# Patient Record
Sex: Male | Born: 1955 | Race: Black or African American | Hispanic: No | Marital: Married | Smoking: Former smoker
Health system: Southern US, Community
[De-identification: ages and names within clinical notes are randomized; demographics above are authoritative.]

## PROBLEM LIST (undated history)

## (undated) DIAGNOSIS — I739 Peripheral vascular disease, unspecified: Secondary | ICD-10-CM

## (undated) DIAGNOSIS — E119 Type 2 diabetes mellitus without complications: Secondary | ICD-10-CM

## (undated) DIAGNOSIS — M1990 Unspecified osteoarthritis, unspecified site: Secondary | ICD-10-CM

## (undated) DIAGNOSIS — E876 Hypokalemia: Secondary | ICD-10-CM

## (undated) DIAGNOSIS — E785 Hyperlipidemia, unspecified: Secondary | ICD-10-CM

## (undated) DIAGNOSIS — G4733 Obstructive sleep apnea (adult) (pediatric): Secondary | ICD-10-CM

## (undated) DIAGNOSIS — M545 Low back pain, unspecified: Secondary | ICD-10-CM

## (undated) DIAGNOSIS — I10 Essential (primary) hypertension: Secondary | ICD-10-CM

## (undated) DIAGNOSIS — G4700 Insomnia, unspecified: Secondary | ICD-10-CM

## (undated) DIAGNOSIS — C9000 Multiple myeloma not having achieved remission: Secondary | ICD-10-CM

## (undated) DIAGNOSIS — E559 Vitamin D deficiency, unspecified: Secondary | ICD-10-CM

## (undated) DIAGNOSIS — K219 Gastro-esophageal reflux disease without esophagitis: Secondary | ICD-10-CM

## (undated) DIAGNOSIS — M109 Gout, unspecified: Secondary | ICD-10-CM

## (undated) DIAGNOSIS — R0600 Dyspnea, unspecified: Secondary | ICD-10-CM

## (undated) DIAGNOSIS — I509 Heart failure, unspecified: Secondary | ICD-10-CM

## (undated) DIAGNOSIS — J449 Chronic obstructive pulmonary disease, unspecified: Secondary | ICD-10-CM

## (undated) DIAGNOSIS — K769 Liver disease, unspecified: Secondary | ICD-10-CM

## (undated) DIAGNOSIS — N289 Disorder of kidney and ureter, unspecified: Secondary | ICD-10-CM

## (undated) DIAGNOSIS — D509 Iron deficiency anemia, unspecified: Secondary | ICD-10-CM

## (undated) DIAGNOSIS — E349 Endocrine disorder, unspecified: Secondary | ICD-10-CM

## (undated) DIAGNOSIS — Z9981 Dependence on supplemental oxygen: Secondary | ICD-10-CM

## (undated) DIAGNOSIS — I2510 Atherosclerotic heart disease of native coronary artery without angina pectoris: Secondary | ICD-10-CM

## (undated) DIAGNOSIS — R413 Other amnesia: Secondary | ICD-10-CM

## (undated) DIAGNOSIS — Z9989 Dependence on other enabling machines and devices: Secondary | ICD-10-CM

## (undated) DIAGNOSIS — G8929 Other chronic pain: Secondary | ICD-10-CM

## (undated) DIAGNOSIS — I639 Cerebral infarction, unspecified: Secondary | ICD-10-CM

## (undated) HISTORY — DX: Other amnesia: R41.3

## (undated) HISTORY — DX: Liver disease, unspecified: K76.9

## (undated) HISTORY — DX: Gastro-esophageal reflux disease without esophagitis: K21.9

## (undated) HISTORY — DX: Vitamin D deficiency, unspecified: E55.9

## (undated) HISTORY — PX: KNEE ARTHROSCOPY: SHX127

## (undated) HISTORY — DX: Hyperlipidemia, unspecified: E78.5

## (undated) HISTORY — DX: Cerebral infarction, unspecified: I63.9

## (undated) HISTORY — DX: Endocrine disorder, unspecified: E34.9

## (undated) HISTORY — DX: Peripheral vascular disease, unspecified: I73.9

## (undated) HISTORY — DX: Chronic obstructive pulmonary disease, unspecified: J44.9

## (undated) HISTORY — DX: Insomnia, unspecified: G47.00

## (undated) HISTORY — DX: Hypokalemia: E87.6

## (undated) HISTORY — DX: Gout, unspecified: M10.9

## (undated) HISTORY — DX: Iron deficiency anemia, unspecified: D50.9

---

## 2011-10-27 ENCOUNTER — Encounter: Payer: Self-pay | Admitting: Physical Medicine & Rehabilitation

## 2011-11-04 DIAGNOSIS — I428 Other cardiomyopathies: Secondary | ICD-10-CM | POA: Insufficient documentation

## 2011-11-04 DIAGNOSIS — I429 Cardiomyopathy, unspecified: Secondary | ICD-10-CM | POA: Insufficient documentation

## 2011-11-04 DIAGNOSIS — M129 Arthropathy, unspecified: Secondary | ICD-10-CM | POA: Insufficient documentation

## 2011-11-04 DIAGNOSIS — F1010 Alcohol abuse, uncomplicated: Secondary | ICD-10-CM | POA: Insufficient documentation

## 2011-11-04 DIAGNOSIS — K219 Gastro-esophageal reflux disease without esophagitis: Secondary | ICD-10-CM | POA: Insufficient documentation

## 2011-11-04 DIAGNOSIS — R413 Other amnesia: Secondary | ICD-10-CM | POA: Insufficient documentation

## 2011-11-05 DIAGNOSIS — E559 Vitamin D deficiency, unspecified: Secondary | ICD-10-CM | POA: Insufficient documentation

## 2011-11-05 DIAGNOSIS — M109 Gout, unspecified: Secondary | ICD-10-CM | POA: Insufficient documentation

## 2011-11-15 ENCOUNTER — Emergency Department (HOSPITAL_COMMUNITY): Payer: Medicare Other

## 2011-11-15 ENCOUNTER — Emergency Department (HOSPITAL_COMMUNITY)
Admission: EM | Admit: 2011-11-15 | Discharge: 2011-11-15 | Disposition: A | Discharge status: Home / Self Care | Payer: Medicare Other | Attending: Emergency Medicine | Admitting: Emergency Medicine

## 2011-11-15 ENCOUNTER — Encounter (HOSPITAL_COMMUNITY): Payer: Self-pay | Admitting: Emergency Medicine

## 2011-11-15 DIAGNOSIS — R079 Chest pain, unspecified: Secondary | ICD-10-CM | POA: Insufficient documentation

## 2011-11-15 DIAGNOSIS — R0989 Other specified symptoms and signs involving the circulatory and respiratory systems: Secondary | ICD-10-CM | POA: Insufficient documentation

## 2011-11-15 DIAGNOSIS — G8929 Other chronic pain: Secondary | ICD-10-CM | POA: Insufficient documentation

## 2011-11-15 DIAGNOSIS — R42 Dizziness and giddiness: Secondary | ICD-10-CM | POA: Insufficient documentation

## 2011-11-15 DIAGNOSIS — M545 Low back pain, unspecified: Secondary | ICD-10-CM | POA: Insufficient documentation

## 2011-11-15 DIAGNOSIS — Z79899 Other long term (current) drug therapy: Secondary | ICD-10-CM | POA: Insufficient documentation

## 2011-11-15 DIAGNOSIS — R0602 Shortness of breath: Secondary | ICD-10-CM | POA: Insufficient documentation

## 2011-11-15 DIAGNOSIS — R51 Headache: Secondary | ICD-10-CM | POA: Insufficient documentation

## 2011-11-15 HISTORY — DX: Essential (primary) hypertension: I10

## 2011-11-15 HISTORY — DX: Atherosclerotic heart disease of native coronary artery without angina pectoris: I25.10

## 2011-11-15 HISTORY — DX: Heart failure, unspecified: I50.9

## 2011-11-15 HISTORY — DX: Disorder of kidney and ureter, unspecified: N28.9

## 2011-11-15 LAB — CBC
HCT: 43.5 % (ref 39.0–52.0)
MCH: 27.1 pg (ref 26.0–34.0)
MCV: 83.7 fL (ref 78.0–100.0)
Platelets: 301 10*3/uL (ref 150–400)
RDW: 15.2 % (ref 11.5–15.5)

## 2011-11-15 LAB — BASIC METABOLIC PANEL
Calcium: 9.7 mg/dL (ref 8.4–10.5)
Creatinine, Ser: 0.88 mg/dL (ref 0.50–1.35)
GFR calc non Af Amer: >90 mL/min (ref >90)
Glucose, Bld: 74 mg/dL (ref 70–99)
Sodium: 145 mEq/L (ref 135–145)

## 2011-11-15 LAB — DIFFERENTIAL
Basophils Absolute: 0 10*3/uL (ref 0.0–0.1)
Eosinophils Absolute: 0.3 10*3/uL (ref 0.0–0.7)
Eosinophils Relative: 3 % (ref 0–5)
Monocytes Absolute: 0.4 10*3/uL (ref 0.1–1.0)

## 2011-11-15 LAB — TROPONIN I: Troponin I: <0.3 ng/mL (ref <0.30)

## 2011-11-15 MED ORDER — ASPIRIN 81 MG PO CHEW
CHEWABLE_TABLET | ORAL | Status: AC
  Administered 2011-11-15: 17:00:00
  Filled 2011-11-15: qty 4

## 2011-11-15 MED ORDER — OXYCODONE-ACETAMINOPHEN 5-325 MG PO TABS: 1.0000 | ORAL_TABLET | ORAL | Status: AC | PRN

## 2011-11-15 MED ORDER — OXYCODONE-ACETAMINOPHEN 5-325 MG PO TABS
ORAL_TABLET | ORAL | Status: AC
  Administered 2011-11-15: 17:00:00
  Filled 2011-11-15: qty 1

## 2011-11-15 NOTE — ED Notes (Signed)
Patient presents to the ED with complaints of a Headache for approximately 48 hours

## 2011-11-15 NOTE — Discharge Instructions (Signed)
FOLLOW UP WITH YOUR DOCTOR THIS WEEK FOR RECHECK. CONTINUE YOUR REGULAR MEDICATIONS AS PRESCRIBED.   Chest Pain (Nonspecific) It is often hard to give a specific diagnosis for the cause of chest pain. There is always a chance that your pain could be related to something serious, such as a heart attack or a blood clot in the lungs. You need to follow up with your caregiver for further evaluation. CAUSES   Heartburn.   Pneumonia or bronchitis.   Anxiety or stress.   Inflammation around your heart (pericarditis) or lung (pleuritis or pleurisy).   A blood clot in the lung.   A collapsed lung (pneumothorax). It can develop suddenly on its own (spontaneous pneumothorax) or from injury (trauma) to the chest.   Shingles infection (herpes zoster virus).  The chest wall is composed of bones, muscles, and cartilage. Any of these can be the source of the pain.  The bones can be bruised by injury.   The muscles or cartilage can be strained by coughing or overwork.   The cartilage can be affected by inflammation and become sore (costochondritis).  DIAGNOSIS  Lab tests or other studies, such as X-rays, electrocardiography, stress testing, or cardiac imaging, may be needed to find the cause of your pain.  TREATMENT   Treatment depends on what may be causing your chest pain. Treatment may include:   Acid blockers for heartburn.   Anti-inflammatory medicine.   Pain medicine for inflammatory conditions.   Antibiotics if an infection is present.   You may be advised to change lifestyle habits. This includes stopping smoking and avoiding alcohol, caffeine, and chocolate.   You may be advised to keep your head raised (elevated) when sleeping. This reduces the chance of acid going backward from your stomach into your esophagus.   Most of the time, nonspecific chest pain will improve within 2 to 3 days with rest and mild pain medicine.  HOME CARE INSTRUCTIONS   If antibiotics were prescribed,  take your antibiotics as directed. Finish them even if you start to feel better.   For the next few days, avoid physical activities that bring on chest pain. Continue physical activities as directed.   Do not smoke.   Avoid drinking alcohol.   Only take over-the-counter or prescription medicine for pain, discomfort, or fever as directed by your caregiver.   Follow your caregiver's suggestions for further testing if your chest pain does not go away.   Keep any follow-up appointments you made. If you do not go to an appointment, you could develop lasting (chronic) problems with pain. If there is any problem keeping an appointment, you must call to reschedule.  SEEK MEDICAL CARE IF:   You think you are having problems from the medicine you are taking. Read your medicine instructions carefully.   Your chest pain does not go away, even after treatment.   You develop a rash with blisters on your chest.  SEEK IMMEDIATE MEDICAL CARE IF:   You have increased chest pain or pain that spreads to your arm, neck, jaw, back, or abdomen.   You develop shortness of breath, an increasing cough, or you are coughing up blood.   You have severe back or abdominal pain, feel nauseous, or vomit.   You develop severe weakness, fainting, or chills.   You have a fever.  THIS IS AN EMERGENCY. Do not wait to see if the pain will go away. Get medical help at once. Call your local emergency services (911 in  [place] Do not drive yourself to the hospital. MAKE SURE YOU:   Understand these instructions.   Will watch your condition.   Will get help right away if you are not doing well or get worse.  Document Released: 04/08/2005 Document Revised: 06/18/2011 Document Reviewed: 02/02/2008 [facility] Patient Information 2012 [place], [place].

## 2011-11-15 NOTE — ED Provider Notes (Signed)
History     CSN: [id number]  Arrival date & time 11/15/11  1525   First MD Initiated Contact with Patient 11/15/11 1631      Chief Complaint  Patient presents with  . Headache    (Consider location/radiation/quality/duration/timing/severity/associated sxs/prior treatment) Patient is a 56 y.o. male presenting with headaches and back pain. The history is provided by the patient.  Headache  This is a new problem. The current episode started yesterday. The problem occurs constantly. The problem has been resolved. Associated with: Chest pain that started yesterday and remained constant until today, resolving spontaneously at 2:00. He has SOB that persists now without pain. No N, V, extremity edema. Associated symptoms include shortness of breath. Pertinent negatives include no fever, no nausea and no vomiting. Associated symptoms comments: He reports a history of COPD, CHF, HTN, DM as well as medication noncompliance. He denies history of ischemic heart disease. .  Back Pain  This is a chronic problem. The problem occurs constantly. Associated symptoms include chest pain and headaches. Pertinent negatives include no fever and no abdominal pain. Associated symptoms comments: Chronic low back pain uncontrolled with usual medication. He is currently being referred to Pain Management. No new injury or change in chronic symptoms..    No past medical history on file.  No past surgical history on file.  No family history on file.  History  Substance Use Topics  . Smoking status: Not on file  . Smokeless tobacco: Not on file  . Alcohol Use: Not on file      Review of Systems  Constitutional: Negative for fever.  Respiratory: Positive for shortness of breath. Negative for cough.   Cardiovascular: Positive for chest pain.  Gastrointestinal: Negative for nausea, vomiting and abdominal pain.  Musculoskeletal: Positive for back pain.  Neurological: Positive for headaches.    Allergies    Review of patient's allergies indicates no known allergies.  Home Medications   Current Outpatient Rx  Name Route Sig Dispense Refill  . ALBUTEROL SULFATE HFA 108 (90 BASE) MCG/ACT IN AERS Inhalation Inhale 2 puffs into the lungs every 6 (six) hours as needed. For shortness of breath    . ALLOPURINOL 300 MG PO TABS Oral Take 300 mg by mouth daily.    Marland Kitchen AMLODIPINE BESYLATE 5 MG PO TABS Oral Take 5 mg by mouth daily.    Marland Kitchen CARVEDILOL 12.5 MG PO TABS Oral Take 12.5 mg by mouth 2 (two) times daily with a meal.    . FLUTICASONE-SALMETEROL 250-50 MCG/DOSE IN AEPB Inhalation Inhale 1 puff into the lungs every 12 (twelve) hours.    . IBUPROFEN 600 MG PO TABS Oral Take 600 mg by mouth 2 (two) times daily.    Marland Kitchen METFORMIN HCL 500 MG PO TABS Oral Take 500 mg by mouth 2 (two) times daily with a meal.    . OLMESARTAN MEDOXOMIL 20 MG PO TABS Oral Take 20 mg by mouth daily.    Marland Kitchen OMEPRAZOLE 20 MG PO CPDR Oral Take 20 mg by mouth every morning.    Marland Kitchen POTASSIUM CHLORIDE ER 10 MEQ PO TBCR Oral Take 10 mEq by mouth daily.    Marland Kitchen PRAVASTATIN SODIUM 10 MG PO TABS Oral Take 20 mg by mouth at bedtime.    Marland Kitchen SILDENAFIL CITRATE 100 MG PO TABS Oral Take 100 mg by mouth daily as needed. For E.D.    . TIOTROPIUM BROMIDE MONOHYDRATE 18 MCG IN CAPS Inhalation Place 18 mcg into inhaler and inhale daily.    Marland Kitchen  VARENICLINE TARTRATE 0.5 MG X 11 & 1 MG X 42 PO MISC Oral Take by mouth 2 (two) times daily. Take one 0.5 mg tablet by mouth once daily for 3 days, then increase to one 0.5 mg tablet twice daily for 4 days, then increase to one 1 mg tablet twice daily.    Marland Kitchen ZOLPIDEM TARTRATE 5 MG PO TABS Oral Take 5 mg by mouth at bedtime.      BP 146/94  Pulse 87  Temp(Src) 98.2 F (36.8 C) (Oral)  Resp 22  SpO2 100%  Physical Exam  Constitutional: He appears well-developed and well-nourished. No distress.  HENT:  Head: Normocephalic.  Neck: Normal range of motion.  Cardiovascular: Normal rate.   No murmur  heard. Pulmonary/Chest: Effort normal and breath sounds normal. He has no wheezes. He exhibits no tenderness.       Rales diffusely at lower lobes.  Abdominal: Soft. There is no tenderness. There is no rebound.  Musculoskeletal:       Mild paralumbar tenderness. No swelling. FROM.  Skin: Skin is warm and dry.    ED Course  Procedures (including critical care time)  Labs Reviewed  GLUCOSE, CAPILLARY - Abnormal; Notable for the following:    Glucose-Capillary 109 (*)    All other components within normal limits  CBC  DIFFERENTIAL  BASIC METABOLIC PANEL  TROPONIN I   No results found. Results for orders placed during the hospital encounter of 11/15/11  GLUCOSE, CAPILLARY      Component Value Range   Glucose-Capillary 109 (*) 70 - 99 (mg/dL)  CBC      Component Value Range   WBC 7.8  4.0 - 10.5 (K/uL)   RBC 5.20  4.22 - 5.81 (MIL/uL)   Hemoglobin 14.1  13.0 - 17.0 (g/dL)   HCT 40.9  81.1 - 91.4 (%)   MCV 83.7  78.0 - 100.0 (fL)   MCH 27.1  26.0 - 34.0 (pg)   MCHC 32.4  30.0 - 36.0 (g/dL)   RDW 78.2  95.6 - 21.3 (%)   Platelets 301  150 - 400 (K/uL)  DIFFERENTIAL      Component Value Range   Neutrophils Relative 52  43 - 77 (%)   Neutro Abs 4.0  1.7 - 7.7 (K/uL)   Lymphocytes Relative 40  12 - 46 (%)   Lymphs Abs 3.1  0.7 - 4.0 (K/uL)   Monocytes Relative 5  3 - 12 (%)   Monocytes Absolute 0.4  0.1 - 1.0 (K/uL)   Eosinophils Relative 3  0 - 5 (%)   Eosinophils Absolute 0.3  0.0 - 0.7 (K/uL)   Basophils Relative 0  0 - 1 (%)   Basophils Absolute 0.0  0.0 - 0.1 (K/uL)  BASIC METABOLIC PANEL      Component Value Range   Sodium 145  135 - 145 (mEq/L)   Potassium 2.9 (*) 3.5 - 5.1 (mEq/L)   Chloride 106  96 - 112 (mEq/L)   CO2 27  19 - 32 (mEq/L)   Glucose, Bld 74  70 - 99 (mg/dL)   BUN 12  6 - 23 (mg/dL)   Creatinine, Ser 0.86  0.50 - 1.35 (mg/dL)   Calcium 9.7  8.4 - 57.8 (mg/dL)   GFR calc non Af Amer >90  >90 (mL/min)   GFR calc Af Amer >90  >90 (mL/min)   TROPONIN I      Component Value Range   Troponin I <0.30  <0.30 (ng/mL)  Dg Chest Portable 1 View  11/15/2011  *RADIOLOGY REPORT*  Clinical Data: High blood pressure and dizziness.  PORTABLE CHEST - 1 VIEW  Comparison: None  Findings: The cardiac silhouette, mediastinal and hilar contours are within normal limits.  There is mild tortuosity and ectasia of the thoracic aorta.  The lungs are clear.  No pleural effusion. The bony thorax is intact.  IMPRESSION: No acute cardiopulmonary findings.  Original Report Authenticated By: P. Loralie Champagne, M.D.     No diagnosis found.  1. Chest pain 2. Chronic back pain   MDM  Pain free while in ED. All labs negative in evaluation of chest pain that would be atypical for angina. Feel he is stable for discharge.         Rodena Medin, PA-C 11/15/11 [phone]

## 2011-11-15 NOTE — ED Notes (Signed)
Resting quietly with family at the bedside NAD noted at this time

## 2011-11-15 NOTE — ED Notes (Signed)
Patient is discharged with instructions with his daughters he verbalizes an understanding of instructions.

## 2011-11-15 NOTE — ED Provider Notes (Signed)
Medical screening examination/treatment/procedure(s) were performed by non-physician practitioner and as supervising physician I was immediately available for consultation/collaboration.  Juliet Rude. Rubin Payor, MD 11/15/11 2321

## 2011-11-23 ENCOUNTER — Encounter: Payer: Self-pay | Admitting: Physical Medicine & Rehabilitation

## 2011-11-23 ENCOUNTER — Encounter: Payer: Medicare Other | Attending: Physical Medicine & Rehabilitation

## 2011-11-23 ENCOUNTER — Ambulatory Visit (HOSPITAL_BASED_OUTPATIENT_CLINIC_OR_DEPARTMENT_OTHER): Payer: Medicare Other | Admitting: Physical Medicine & Rehabilitation

## 2011-11-23 DIAGNOSIS — J438 Other emphysema: Secondary | ICD-10-CM | POA: Insufficient documentation

## 2011-11-23 DIAGNOSIS — M545 Low back pain, unspecified: Secondary | ICD-10-CM | POA: Insufficient documentation

## 2011-11-23 DIAGNOSIS — M109 Gout, unspecified: Secondary | ICD-10-CM | POA: Insufficient documentation

## 2011-11-23 DIAGNOSIS — M1712 Unilateral primary osteoarthritis, left knee: Secondary | ICD-10-CM | POA: Insufficient documentation

## 2011-11-23 DIAGNOSIS — E785 Hyperlipidemia, unspecified: Secondary | ICD-10-CM

## 2011-11-23 DIAGNOSIS — E119 Type 2 diabetes mellitus without complications: Secondary | ICD-10-CM

## 2011-11-23 DIAGNOSIS — G8929 Other chronic pain: Secondary | ICD-10-CM

## 2011-11-23 DIAGNOSIS — I10 Essential (primary) hypertension: Secondary | ICD-10-CM | POA: Insufficient documentation

## 2011-11-23 DIAGNOSIS — M1710 Unilateral primary osteoarthritis, unspecified knee: Secondary | ICD-10-CM | POA: Insufficient documentation

## 2011-11-23 DIAGNOSIS — M1A9XX0 Chronic gout, unspecified, without tophus (tophi): Secondary | ICD-10-CM | POA: Insufficient documentation

## 2011-11-23 DIAGNOSIS — M1A00X0 Idiopathic chronic gout, unspecified site, without tophus (tophi): Secondary | ICD-10-CM

## 2011-11-23 DIAGNOSIS — I509 Heart failure, unspecified: Secondary | ICD-10-CM | POA: Insufficient documentation

## 2011-11-23 DIAGNOSIS — M549 Dorsalgia, unspecified: Secondary | ICD-10-CM

## 2011-11-23 DIAGNOSIS — M5430 Sciatica, unspecified side: Secondary | ICD-10-CM | POA: Insufficient documentation

## 2011-11-23 DIAGNOSIS — J42 Unspecified chronic bronchitis: Secondary | ICD-10-CM | POA: Insufficient documentation

## 2011-11-23 MED ORDER — CYCLOBENZAPRINE HCL 10 MG PO TABS: 10.0000 mg | ORAL_TABLET | Freq: Three times a day (TID) | ORAL | Status: DC | PRN

## 2011-11-23 MED ORDER — TRAMADOL HCL 50 MG PO TABS: 50.0000 mg | ORAL_TABLET | Freq: Four times a day (QID) | ORAL | Status: AC | PRN

## 2011-11-23 MED ORDER — TRAMADOL HCL 50 MG PO TABS: 50.0000 mg | ORAL_TABLET | Freq: Three times a day (TID) | ORAL | Status: DC | PRN

## 2011-11-23 NOTE — Patient Instructions (Addendum)
At this point your examination shows no serious problem this has been going on 19 years. I don't see a reason to send you to a surgeon. We will check some x-rays. Will give you some exercises. We'll try you on tramadol which she states has helped in the past as well as cyclobenzaprine. We'll check a urine drug screen in case we later at on some narcotic medications. I'll need to get some documentation that you have some serious back issues prior to starting any stronger medications. Back Pain, Adult Low back pain is very common. About 1 in 5 people have back pain.The cause of low back pain is rarely dangerous. The pain often gets better over time.About half of people with a sudden onset of back pain feel better in just 2 weeks. About 8 in 10 people feel better by 6 weeks.  CAUSES Some common causes of back pain include:  Strain of the muscles or ligaments supporting the spine.   Wear and tear (degeneration) of the spinal discs.   Arthritis.   Direct injury to the back.  DIAGNOSIS Most of the time, the direct cause of low back pain is not known.However, back pain can be treated effectively even when the exact cause of the pain is unknown.Answering your caregiver's questions about your overall health and symptoms is one of the most accurate ways to make sure the cause of your pain is not dangerous. If your caregiver needs more information, he or she may order lab work or imaging tests (X-rays or MRIs).However, even if imaging tests show changes in your back, this usually does not require surgery. HOME CARE INSTRUCTIONS For many people, back pain returns.Since low back pain is rarely dangerous, it is often a condition that people can learn to [facility] their own.   Remain active. It is stressful on the back to sit or stand in one place. Do not sit, drive, or stand in one place for more than 30 minutes at a time. Take short walks on level surfaces as soon as pain allows.Try to increase the  length of time you walk each day.   Do not stay in bed.Resting more than 1 or 2 days can delay your recovery.   Do not avoid exercise or work.Your body is made to move.It is not dangerous to be active, even though your back may hurt.Your back will likely heal faster if you return to being active before your pain is gone.   Pay attention to your body when you bend and lift. Many people have less discomfortwhen lifting if they bend their knees, keep the load close to their bodies,and avoid twisting. Often, the most comfortable positions are those that put less stress on your recovering back.   Find a comfortable position to sleep. Use a firm mattress and lie on your side with your knees slightly bent. If you lie on your back, put a pillow under your knees.   Only take over-the-counter or prescription medicines as directed by your caregiver. Over-the-counter medicines to reduce pain and inflammation are often the most helpful.Your caregiver may prescribe muscle relaxant drugs.These medicines help dull your pain so you can more quickly return to your normal activities and healthy exercise.   Put ice on the injured area.   Put ice in a plastic bag.   Place a towel between your skin and the bag.   Leave the ice on for 15 to 20 minutes, 3 to 4 times a day for the first 2 to 3  days. After that, ice and heat may be alternated to reduce pain and spasms.   Ask your caregiver about trying back exercises and gentle massage. This may be of some benefit.   Avoid feeling anxious or stressed.Stress increases muscle tension and can worsen back pain.It is important to recognize when you are anxious or stressed and learn ways to manage it.Exercise is a great option.  SEEK MEDICAL CARE IF:  You have pain that is not relieved with rest or medicine.   You have pain that does not improve in 1 week.   You have new symptoms.   You are generally not feeling well.  SEEK IMMEDIATE MEDICAL CARE IF:    You have pain that radiates from your back into your legs.   You develop new bowel or bladder control problems.   You have unusual weakness or numbness in your arms or legs.   You develop nausea or vomiting.   You develop abdominal pain.   You feel faint.  Document Released: 06/29/2005 Document Revised: 06/18/2011 Document Reviewed: 11/17/2010 [facility] Patient Information 2012 [place], [place].

## 2011-11-23 NOTE — Progress Notes (Addendum)
Subjective:    Patient ID: James Zuniga, male    DOB: September 16, 1955, 56 y.o.   MRN: [id number]  HPI 19 year history of low back pain and pain shooting down the left leg. He also has knee arthritis and is soon having a left knee replacement. He needs to get cardiac clearance prior to his knee replacement. His other past medical history includes Recently saw orthopedics and was given hydrocodone for knee pain. Usually uses a fentanyl patch for his back pain. The patient needs to go to a spine clinic in [place] and they also did lumbar injections which were helpful. Congestive heart failure Chronic gout, Emphysema Diabetes type 2 Hyperlipidemia Hypertension Chronic bronchitis  Pain Inventory Average Pain 7 Pain Right Now 7 My pain is sharp, stabbing and aching  In the last 24 hours, has pain interfered with the following? General activity 7 Relation with others 7 Enjoyment of life 7 What TIME of day is your pain at its worst? daytime and night Sleep (in general) Poor  Pain is worse with: walking, bending, sitting and standing Pain improves with: medication, TENS and injections Relief from Meds: 5  Mobility walk without assistance walk with assistance use a cane how many minutes can you walk? 10-15 min ability to climb steps?  no do you drive?  yes  Function retired I need assistance with the following:  meal prep, household duties and shopping  Neuro/Psych bladder control problems trouble walking  Prior Studies Any changes since last visit?  no  Physicians involved in your care Any changes since last visit?  no       Review of Systems  Constitutional: Negative.   HENT: Negative.   Eyes: Negative.   Respiratory: Negative.   Cardiovascular: Negative.   Gastrointestinal: Negative.   Genitourinary: Negative.   Musculoskeletal: Negative.   Skin: Negative.   Neurological: Negative.   Hematological: Negative.   Psychiatric/Behavioral: Negative.    Social  Lives in [place] for 3 months     Objective:   Physical Exam  Constitutional: He is oriented to person, place, and time. He appears well-developed and well-nourished.  Musculoskeletal:       Lumbar back: He exhibits decreased range of motion and edema. He exhibits no tenderness and no deformity.       skin discoloration under the infrared current stimulatorat the lumbosacral paraspinal muscles. Range of motion is diminished in all directions  Neurological: He is alert and oriented to person, place, and time. He has normal strength. No sensory deficit. Gait abnormal.  Reflex Scores:      Tricep reflexes are 2+ on the right side and 2+ on the left side.      Bicep reflexes are 2+ on the right side and 2+ on the left side.      Brachioradialis reflexes are 2+ on the right side and 2+ on the left side.      Patellar reflexes are 2+ on the right side and 2+ on the left side.      Achilles reflexes are 2+ on the right side and 2+ on the left side. Psychiatric: He has a normal mood and affect.              Assessment:    Sciatica possibly due to degenerative joint disease at intervertebral facet joints    Plan:    Natural history and expected course discussed. Questions answered. Stretching exercises discussed. Muscle relaxants per medication orders.  Trial of tramadol Lumbosacral x-rays See the patient  back in 4 weeks  UDS positive for hydrocodone and oxycodone  Negative for illicit drugs

## 2011-12-03 ENCOUNTER — Telehealth: Payer: Self-pay | Admitting: *Deleted

## 2011-12-03 NOTE — Telephone Encounter (Signed)
Error

## 2011-12-14 ENCOUNTER — Encounter: Payer: Medicare Other | Attending: Physical Medicine & Rehabilitation

## 2011-12-14 ENCOUNTER — Encounter: Payer: Self-pay | Admitting: Physical Medicine & Rehabilitation

## 2011-12-14 ENCOUNTER — Ambulatory Visit (HOSPITAL_BASED_OUTPATIENT_CLINIC_OR_DEPARTMENT_OTHER): Payer: Medicare Other | Admitting: Physical Medicine & Rehabilitation

## 2011-12-14 VITALS — BP 138/88 | HR 73 | Ht 66.0 in | Wt 183.0 lb

## 2011-12-14 DIAGNOSIS — M545 Low back pain, unspecified: Secondary | ICD-10-CM | POA: Insufficient documentation

## 2011-12-14 DIAGNOSIS — M5430 Sciatica, unspecified side: Secondary | ICD-10-CM | POA: Insufficient documentation

## 2011-12-14 DIAGNOSIS — E785 Hyperlipidemia, unspecified: Secondary | ICD-10-CM | POA: Insufficient documentation

## 2011-12-14 DIAGNOSIS — E119 Type 2 diabetes mellitus without complications: Secondary | ICD-10-CM | POA: Insufficient documentation

## 2011-12-14 DIAGNOSIS — M109 Gout, unspecified: Secondary | ICD-10-CM | POA: Insufficient documentation

## 2011-12-14 DIAGNOSIS — I10 Essential (primary) hypertension: Secondary | ICD-10-CM | POA: Insufficient documentation

## 2011-12-14 DIAGNOSIS — J438 Other emphysema: Secondary | ICD-10-CM | POA: Insufficient documentation

## 2011-12-14 DIAGNOSIS — M1710 Unilateral primary osteoarthritis, unspecified knee: Secondary | ICD-10-CM | POA: Insufficient documentation

## 2011-12-14 MED ORDER — TRAMADOL HCL 50 MG PO TABS: 50.0000 mg | ORAL_TABLET | Freq: Four times a day (QID) | ORAL | Status: AC | PRN

## 2011-12-14 NOTE — Patient Instructions (Signed)

## 2011-12-14 NOTE — Progress Notes (Signed)
Subjective:    Patient ID: James Zuniga, male    DOB: [birth year], 56 y.o.   MRN: [id number]  HPI  Pain Inventory Average Pain 8 Pain Right Now 7 My pain is sharp, stabbing and aching  In the last 24 hours, has pain interfered with the following? General activity 7 Relation with others 7 Enjoyment of life 7 What TIME of day is your pain at its worst? varies Sleep (in general) Fair  Pain is worse with: some activites Pain improves with: rest and medication Relief from Meds: 0  Mobility walk with assistance use a cane how many minutes can you walk? 10 min ability to climb steps?  yes do you drive?  yes  Function disabled: date disabled 27 I need assistance with the following:  household duties and shopping  Neuro/Psych weakness numbness trouble walking spasms dizziness confusion  Prior Studies Any changes since last visit?  no  Physicians involved in your care Any changes since last visit?  no   Family History  Problem Relation Age of Onset  . Heart disease Mother    History   Social History  . Marital Status: Unknown    Spouse Name: N/A    Number of Children: N/A  . Years of Education: N/A   Social History Main Topics  . Smoking status: Former Games developer  . Smokeless tobacco: Never Used  . Alcohol Use: No  . Drug Use: None  . Sexually Active: None   Other Topics Concern  . None   Social History Narrative  . None   History reviewed. No pertinent past surgical history. Past Medical History  Diagnosis Date  . CHF (congestive heart failure)   . Coronary artery disease   . Diabetes mellitus   . Hypertension   . Renal disorder   . Hyperlipidemia    BP 138/88  Pulse 73  Ht 5\' 6"  (1.676 m)  Wt 183 lb (83.008 kg)  BMI 29.54 kg/m2  SpO2 97%     Review of Systems  Respiratory: Positive for cough and shortness of breath.   Gastrointestinal: Positive for diarrhea.  Genitourinary: Positive for difficulty urinating.  Neurological: Positive  for weakness and numbness.  All other systems reviewed and are negative.       Objective:   Physical Exam  Musculoskeletal:       Lumbar back: He exhibits decreased range of motion and tenderness. He exhibits no swelling, no edema, no deformity and no spasm.       Mild tenderness in lumbo sacral paraspinal muscles  Neurological: He is alert. He has normal strength. He displays no atrophy. He exhibits normal muscle tone. Gait abnormal.  Reflex Scores:      Tricep reflexes are 2+ on the right side and 2+ on the left side.      Bicep reflexes are 2+ on the right side and 2+ on the left side.      Brachioradialis reflexes are 2+ on the right side and 2+ on the left side.      Patellar reflexes are 2+ on the right side and 2+ on the left side.      Achilles reflexes are 2+ on the right side and 2+ on the left side. Psychiatric: His affect is blunt.          Assessment & Plan:  1. Lumbar pain without sciatica. The patient states he has had pain for 19 years and has been seen at a spine Center in the past. I do not  have any records. He has no imaging studies that I can review. He did not undergo the x-ray study that I ordered last visit. We discussed that based on the fact he has no risk factors for any serious condition I will treat him as a new patient. We'll do conservative care without narcotic analgesics check x-rays. Encouraged active lifestyle. He'll see my PA in 2 weeks. If he continues have pain in the x-rays do not show any significant pathology may be an MRI at that point. He can also receive back exercises at his next visit.

## 2011-12-21 ENCOUNTER — Ambulatory Visit: Payer: Medicare Other | Admitting: Physical Medicine & Rehabilitation

## 2011-12-22 ENCOUNTER — Ambulatory Visit (HOSPITAL_COMMUNITY)
Admission: RE | Admit: 2011-12-22 | Discharge: 2011-12-22 | Disposition: A | Discharge status: Home / Self Care | Payer: Medicare Other | Source: Ambulatory Visit | Attending: Physical Medicine & Rehabilitation | Admitting: Physical Medicine & Rehabilitation

## 2011-12-22 DIAGNOSIS — M545 Low back pain, unspecified: Secondary | ICD-10-CM | POA: Insufficient documentation

## 2011-12-22 DIAGNOSIS — M948X9 Other specified disorders of cartilage, unspecified sites: Secondary | ICD-10-CM | POA: Insufficient documentation

## 2011-12-22 DIAGNOSIS — M51379 Other intervertebral disc degeneration, lumbosacral region without mention of lumbar back pain or lower extremity pain: Secondary | ICD-10-CM | POA: Insufficient documentation

## 2011-12-22 DIAGNOSIS — M5137 Other intervertebral disc degeneration, lumbosacral region: Secondary | ICD-10-CM | POA: Insufficient documentation

## 2011-12-23 ENCOUNTER — Telehealth: Payer: Self-pay | Admitting: *Deleted

## 2011-12-23 MED ORDER — MELOXICAM 7.5 MG PO TABS: 7.5000 mg | ORAL_TABLET | Freq: Every day | ORAL | Status: DC

## 2011-12-23 NOTE — Telephone Encounter (Signed)
Pt aware of xray results and he would like to proceed with an injection. What injection would you like done? He is also wanting a stronger pain medication, Tramadol isn't helping.

## 2011-12-23 NOTE — Telephone Encounter (Signed)
May call in mobic 7.5mg  qd #30 no refill Schedule for sacroiliac injection

## 2011-12-23 NOTE — Telephone Encounter (Signed)
LM with pt. Please get injection approved. Thanks!

## 2011-12-23 NOTE — Telephone Encounter (Signed)
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

## 2011-12-23 NOTE — Addendum Note (Signed)
Addended by: Caryl Ada on: 12/23/2011 12:00 PM   Modules accepted: Orders

## 2011-12-28 ENCOUNTER — Encounter: Payer: Medicare Other | Attending: Physical Medicine & Rehabilitation | Admitting: Physical Medicine and Rehabilitation

## 2011-12-31 ENCOUNTER — Ambulatory Visit: Payer: Medicare Other | Admitting: Physical Medicine & Rehabilitation

## 2012-01-04 ENCOUNTER — Telehealth: Payer: Self-pay | Admitting: *Deleted

## 2012-01-04 DIAGNOSIS — Z6827 Body mass index (BMI) 27.0-27.9, adult: Secondary | ICD-10-CM | POA: Insufficient documentation

## 2012-01-04 NOTE — Telephone Encounter (Signed)
FYI - pt went to PCP office complaining about his back pain. No narcs were given but wanted to let us know that he was hinting that he wanted them.

## 2012-01-15 ENCOUNTER — Telehealth: Payer: Self-pay | Admitting: Physical Medicine & Rehabilitation

## 2012-01-15 NOTE — Telephone Encounter (Signed)
Had surgery on his knee this week and the Dr gave him Percocet and crutches.  FYI

## 2012-01-19 ENCOUNTER — Ambulatory Visit: Payer: Medicare Other | Admitting: Physical Medicine & Rehabilitation

## 2012-02-04 ENCOUNTER — Encounter: Payer: Medicare Other | Attending: Physical Medicine & Rehabilitation

## 2012-02-04 ENCOUNTER — Encounter: Payer: Self-pay | Admitting: Physical Medicine & Rehabilitation

## 2012-02-04 ENCOUNTER — Ambulatory Visit (HOSPITAL_BASED_OUTPATIENT_CLINIC_OR_DEPARTMENT_OTHER): Payer: Medicare Other | Admitting: Physical Medicine & Rehabilitation

## 2012-02-04 VITALS — BP 150/96 | HR 108 | Resp 16 | Ht 66.0 in | Wt 184.0 lb

## 2012-02-04 DIAGNOSIS — M5430 Sciatica, unspecified side: Secondary | ICD-10-CM | POA: Insufficient documentation

## 2012-02-04 DIAGNOSIS — M533 Sacrococcygeal disorders, not elsewhere classified: Secondary | ICD-10-CM

## 2012-02-04 DIAGNOSIS — M1710 Unilateral primary osteoarthritis, unspecified knee: Secondary | ICD-10-CM | POA: Insufficient documentation

## 2012-02-04 DIAGNOSIS — M545 Low back pain, unspecified: Secondary | ICD-10-CM | POA: Insufficient documentation

## 2012-02-04 DIAGNOSIS — E119 Type 2 diabetes mellitus without complications: Secondary | ICD-10-CM | POA: Insufficient documentation

## 2012-02-04 DIAGNOSIS — M109 Gout, unspecified: Secondary | ICD-10-CM | POA: Insufficient documentation

## 2012-02-04 DIAGNOSIS — E785 Hyperlipidemia, unspecified: Secondary | ICD-10-CM | POA: Insufficient documentation

## 2012-02-04 DIAGNOSIS — I10 Essential (primary) hypertension: Secondary | ICD-10-CM | POA: Insufficient documentation

## 2012-02-04 DIAGNOSIS — J438 Other emphysema: Secondary | ICD-10-CM | POA: Insufficient documentation

## 2012-02-04 NOTE — Patient Instructions (Signed)
Sacroiliac Joint Dysfunction The sacroiliac joint connects the lower part of the spine (the sacrum) with the bones of the pelvis. CAUSES  Sometimes, there is no obvious reason for sacroiliac joint dysfunction. Other times, it may occur   During pregnancy.   After injury, such as:   Car accidents.   Sport-related injuries.   Work-related injuries.   Due to one leg being shorter than the other.   Due to other conditions that affect the joints, such as:   Rheumatoid arthritis.   Gout.   Psoriasis.   Joint infection (septic arthritis).  SYMPTOMS  Symptoms may include:  Pain in the:   Lower back.   Buttocks.   Groin.   Thighs and legs.   Difficult sitting, standing, walking, lying, bending or lifting.  DIAGNOSIS  A number of tests may be used to help diagnose the cause of sacroiliac joint dysfunction, including:  Imaging tests to look for other causes of pain, including:   MRI.   CT scan.   Bone scan.   Diagnostic injection: During a special x-ray (called fluoroscopy), a needle is put into the sacroiliac joint. A numbing medicine is injected into the joint. If the pain is improved or stopped, the diagnosis of sacroiliac joint dysfunction is more likely.  TREATMENT  There are a number of types of treatment used for sacroiliac joint dysfunction, including:  Only take over-the-counter or prescription medicines for pain, discomfort, or fever as directed by your caregiver.   Medications to relax muscles.   Rest. Decreasing activity can help cut down on painful muscle spasms and allow the back to heal.   Application of heat or ice to the lower back may improve muscle spasms and soothe pain.   Brace. A special back brace, called a sacroiliac belt, can help support the joint while your back is healing.   Physical therapy can help teach comfortable positions and exercises to strengthen muscles that support the sacroiliac joint.   Cortisone injections. Injections  of steroid medicine into the joint can help decrease swelling and improve pain.   Hyaluronic acid injections. This chemical improves lubrication within the sacroiliac joint, thereby decreasing pain.   Radiofrequency ablation. A special needle is placed into the joint, where it burns away nerves that are carrying pain messages from the joint.   Surgery. Because pain occurs during movement of the joint, screws and plates may be installed in order to limit or prevent joint motion.  HOME CARE INSTRUCTIONS   Take all medications exactly as directed.   Follow instructions regarding both rest and physical activity, to avoid worsening the pain.   Do physical therapy exercises exactly as prescribed.  SEEK IMMEDIATE MEDICAL CARE IF:  You experience increasingly severe pain.   You develop new symptoms, such as numbness or tingling in your legs or feet.   You lose bladder or bowel control.  Document Released: 09/25/2008 Document Revised: 06/18/2011 Document Reviewed: 09/25/2008 [employer] Patient Information 2012 [employer]. 

## 2012-02-04 NOTE — Progress Notes (Signed)
Left sacroiliac injection under fluoroscopic guidance  Indication: Left Low back and buttocks pain not relieved by medication management and other conservative care.  Informed consent was obtained after describing risks and benefits of the procedure with the patient, this includes bleeding, bruising, infection, paralysis and medication side effects. The patient wishes to proceed and has given written consent. The patient was placed in a prone position. The lumbar and sacral area was marked and prepped with Betadine. A 25-gauge 1-1/2 inch needle was inserted into the skin and subcutaneous tissue and 1 mL of 1% lidocaine was injected. Then a 25-gauge 3 inch spinal needle was inserted under fluoroscopic guidance into the left sacroiliac joint. AP and lateral images were utilized. Omnipaque 180x0.5 mL under live fluoroscopy demonstrated no intravascular uptake. Then a solution containing one ML of 40 mg per mL depomedrol and 2 ML of 1% lidocaine MPF was injected x1.5 mL. Patient tolerated the procedure well. Post procedure instructions were given. Please see post procedure form. 

## 2012-02-04 NOTE — Progress Notes (Signed)
  PROCEDURE RECORD [facility]   Name: James Zuniga DOB:[birth year] MRN: [id number]  Date:02/04/2012  Physician: Claudette Laws, MD    Nurse/CMA: Noralyn Pick, CMA  Allergies: No Known Allergies  Consent Signed: yes  Is patient diabetic? yes  CBG today? Didn't check this AM  Pregnant: no LMP: No LMP for male patient. (age 57-55)  Anticoagulants: no Anti-inflammatory: no Antibiotics: no  Procedure: Sacroiliac Joint Injection  Position: Prone Start Time: 9:21am  End Time: 9:26am  Fluoro Time: 11 seconds  RN/CMA Carroll,CMA Carroll,CMA    Time 8:39am 9:28am    BP 150/96 157/96    Pulse 108 105    Respirations 16 16    O2 Sat 100% 100%    S/S 6 6    Pain Level 7/10 7/10     D/C home with [name] (brother), patient A & O X 3, D/C instructions reviewed, and sits independently.

## 2012-03-08 ENCOUNTER — Ambulatory Visit: Payer: Medicare Other | Admitting: Physical Medicine & Rehabilitation

## 2012-03-22 ENCOUNTER — Encounter: Payer: Self-pay | Admitting: Physical Medicine & Rehabilitation

## 2012-03-22 ENCOUNTER — Ambulatory Visit (HOSPITAL_BASED_OUTPATIENT_CLINIC_OR_DEPARTMENT_OTHER): Payer: Medicare Other | Admitting: Physical Medicine & Rehabilitation

## 2012-03-22 ENCOUNTER — Encounter: Payer: Medicare Other | Attending: Physical Medicine & Rehabilitation

## 2012-03-22 VITALS — BP 145/92 | HR 91 | Resp 16 | Ht 66.0 in | Wt 175.0 lb

## 2012-03-22 DIAGNOSIS — M1710 Unilateral primary osteoarthritis, unspecified knee: Secondary | ICD-10-CM | POA: Insufficient documentation

## 2012-03-22 DIAGNOSIS — M545 Low back pain, unspecified: Secondary | ICD-10-CM | POA: Insufficient documentation

## 2012-03-22 DIAGNOSIS — M109 Gout, unspecified: Secondary | ICD-10-CM | POA: Insufficient documentation

## 2012-03-22 DIAGNOSIS — J438 Other emphysema: Secondary | ICD-10-CM | POA: Insufficient documentation

## 2012-03-22 DIAGNOSIS — M47817 Spondylosis without myelopathy or radiculopathy, lumbosacral region: Secondary | ICD-10-CM

## 2012-03-22 DIAGNOSIS — E785 Hyperlipidemia, unspecified: Secondary | ICD-10-CM | POA: Insufficient documentation

## 2012-03-22 DIAGNOSIS — I10 Essential (primary) hypertension: Secondary | ICD-10-CM | POA: Insufficient documentation

## 2012-03-22 DIAGNOSIS — E119 Type 2 diabetes mellitus without complications: Secondary | ICD-10-CM | POA: Insufficient documentation

## 2012-03-22 DIAGNOSIS — M5430 Sciatica, unspecified side: Secondary | ICD-10-CM | POA: Insufficient documentation

## 2012-03-22 DIAGNOSIS — M47816 Spondylosis without myelopathy or radiculopathy, lumbar region: Secondary | ICD-10-CM

## 2012-03-22 MED ORDER — TRAMADOL HCL ER 100 MG PO TB24: 100.0000 mg | ORAL_TABLET | Freq: Every day | ORAL | Status: DC

## 2012-03-22 MED ORDER — DICLOFENAC SODIUM 1 % TD GEL: 4.0000 g | Freq: Four times a day (QID) | TRANSDERMAL | Status: DC

## 2012-03-22 NOTE — Progress Notes (Signed)
Left Lumbar L3, L4  medial branch blocks and L 5 dorsal ramus injection under fluoroscopic guidance   Indication: Left Lumbar pain which is not relieved by medication management or other conservative care and interfering with self-care and mobility.  Informed consent was obtained after describing risks and benefits of the procedure with the patient, this includes bleeding, bruising, infection, paralysis and medication side effects.  The patient wishes to proceed and has given written consent.  The patient was placed in a prone position.  The lumbar area was marked and prepped with Betadine.  One mL of 1% lidocaine was injected into each of 3 areas into the skin and subcutaneous tissue.  Then a 22-gauge 3.5 inch spinal needle was inserted targeting the junction of the left S1 superior articular process and sacral ala junction.  Needle was advanced under fluoroscopic guidance.  Bone contact was made.  Omnipaque 180 was injected x 0.5 mL demonstrating no intravascular uptake.  Then a solution containing one mL of 4 mg per mL dexamethasone and 3 mL of 2% MPF lidocaine was injected x 0.5 mL.  Then the left L5 superior articular process in transverse process junction was targeted.  Bone contact was made.  Omnipaque 180 was injected x 0.5 mL demonstrating no intravascular uptake.  Then a solution containing one mL of 4 mg per mL dexamethasone and 3 mL of 2% MPF lidocaine was injected x 0.5 mL.  Then the left L4 superior articular process in transverse process junction was targeted.  Bone contact was made.  Omnipaque 180 was injected x 0.5 mL demonstrating no intravascular uptake.  Then a solution containing one mL of 4 mg per mL dexamethasone and 3 mL of 2% MPF lidocaine was injected x 0.5 mL.  Patient tolerated procedure well.  Post procedure instructions were given. 

## 2012-03-22 NOTE — Patient Instructions (Signed)
Facet Block A facet block is an injection procedure used to numb nerves near a spinal joint (facet). The injection usually includes a medicine like Novacaine (anesthetic) and a steroid medicine (similar to cortisone). The injections are made directly into the facet joint of the back. They are used for patients with several types of neck or back pain problems (such as worsening arthritis or persistent pain after surgery) that have not been helped with anti-inflammatory medications, exercise programs, physical therapy, and other forms of pain management. Multiple injections may be needed depending on how many joints are involved.  A facet block procedure can be helpful with diagnosis as well as providing therapeutic pain relief. One of three things may happen after the procedure:  The pain does not go away. This can mean that the pain is probably not coming from blocked facet joints. This information is helpful with diagnosis.   The pain goes away and stays away for a few hours but the original pain comes back and does not get better again. This information is also helpful with diagnosis. It can mean that pain is probably coming from the joints; but the steroid was not helpful for longer term pain control.   The pain goes away after the block, then returns later that day, and then gets better again over the next few days. This can mean that the block was helpful for pain control and the steroid had a longer lasting effect.  If there is good, lasting benefit from the injections, the block may be repeated from 3 to 5 times. If there is good relief but it is only of short-term benefit, other procedures (such as radiofrequency lesioning) may be considered.  Note: The procedure cannot be performed if you have an active infection, a lesion on or near the area of injection, flu, cold, fever, very high blood pressure or if you are on blood thinners. Please make your doctor aware of any of these conditions. This is  for your safety!  LET YOUR CAREGIVER KNOW ABOUT:   Allergies.   Medications taken including herbs, eye drops, over the counter medications, and creams   Use of steroids (by mouth or creams).   Possible pregnancy, if applicable.   Previous problems with anesthetics or Novocaine.   History of blood clots.   History of bleeding or blood problems.   Previous surgery, particularly of the neck and/or back   Other health problems.  RISKS AND COMPLICATIONS These are very uncommon but include:  Bleeding.   Injury to a nerve near the injection site.   Weakness or numbness in areas controlled by nerves near the injection site.   Infection.   Pain at the site of the injection.   Temporary fluid retention in those who are prone to this problem.   Allergic reaction to anesthetics or medicines used during the procedure.  Diabetics may have a temporary increase in their blood sugar after any surgical procedure, especially if steroids are used. Stinging/burning of the numbing medicine is the most uncomfortable part of the procedure; however every person's response to any procedure is individual.  BEFORE THE PROCEDURE   Your caregiver will provide instructions about stopping any medication before the procedure.   Unless advised otherwise, if the injections are in your neck, you may take your medications as usual with a sip of water but do not eat or drink for 6 hours before the procedure.   Unless advised otherwise, you may eat, drink and take your medications as   usual on the day of the procedure (both before and after) if the injections are to be in your lower back.   There is no other specific preparation necessary unless advised otherwise.  PROCEDURE After checking your blood pressure, the procedure will be done in the x-ray (fluoroscopy) room while lying on your stomach. For procedures in the neck, an intravenous line is usually started. The back is then cleansed with an antiseptic  soap. Sterile drapes are placed in this area. The skin is numbed with a local anesthetic. This is felt as a stinging or burning sensation. Using x-ray guidance, needles are then advanced to the appropriate locations. Once the needles are in the proper location, the anesthetic and steroid is injected through the needles and the needles are removed. The skin is then cleansed and bandages are applied. Blood pressure will be checked again, and you will be discharged to leave with your ride after your caregiver says it is okay to go.  AFTER THE PROCEDURE  You may not drive for the remainder of the day after your procedure. An adult must be present to drive you home or to go with you in a taxi or on public transportation. The procedure will be canceled if you do not have a responsible adult with you! This is for your safety.  HOME CARE INSTRUCTIONS   The bandages noted above can be removed on the morning after the procedure.   Resume medications according to your caregiver's instructions.   No heat is to be used near or over the injected area(s) for the remainder of the day.   No tub bath or soaking in water (such as a pool, jacuzzi, etc.) for the remainder of the day.   Some local tenderness may be experienced for a couple of days after the injection. Using an ice pack three or four times a day will help this.   Keep track of the amount of pain relief as well as how long the pain relief lasted.  SEEK MEDICAL CARE IF:   There is drainage from the injection site.   Pain is not controlled with medications prescribed.   There is significant bleeding or swelling.  SEEK IMMEDIATE MEDICAL CARE IF:   You develop a fever of 101 F (38.3 C) or greater.   Worsening pain, swelling, and/or red streaking develops in the skin around the injection site.   Severe pain develops and cannot be controlled with medications prescribed.   You develop any headache, stiff neck, nausea, vomiting, or your eyes become  very sensitive to light.   Weakness or paralysis develops in arms or legs not present before the procedure.   You develop difficulty urinating or difficulty breathing.  Document Released: 11/18/2006 Document Revised: 06/18/2011 Document Reviewed: 11/08/2008 [employer] Patient Information 2012 [employer]. 

## 2012-03-22 NOTE — Progress Notes (Signed)
  PROCEDURE RECORD [facility]   Name: James Zuniga DOB:[birth year] MRN: [id number]  Date:03/22/2012  Physician: Claudette Laws, MD    Nurse/CMA: Redgie Grayer  Allergies: No Known Allergies  Consent Signed: yes  Is patient diabetic? yes  CBG today? Didn't check this morning  Pregnant: no LMP: No LMP for male patient. (age 57-55)  Anticoagulants: no Anti-inflammatory: no Antibiotics: no  Procedure: Medial Branch Block  Position: Prone Start Time: 1:34pm  End Time: 1:39pm  Fluoro Time: 12 seconds  RN/CMA Carroll,CMA Carroll,CMA    Time 1:14pm 1:41pm    BP 145/92 138/90    Pulse 91 83    Respirations 16 16    O2 Sat 99% 100%    S/S 6 6    Pain Level 8/10 8/10     D/C home with Joy (daughter), patient A & O X 3, D/C instructions reviewed, and sits independently.

## 2012-04-22 ENCOUNTER — Telehealth: Payer: Self-pay | Admitting: Physical Medicine & Rehabilitation

## 2012-04-22 DIAGNOSIS — G8929 Other chronic pain: Secondary | ICD-10-CM

## 2012-04-22 NOTE — Telephone Encounter (Signed)
Dr ordered TENS unit for patient ,  Patient has information for order.

## 2012-04-22 NOTE — Telephone Encounter (Signed)
Patient said he had given [place] a fax number to send a request for a tens unit to.  The number was the wrong number so he gave [place] the correct number [phone].  New order faxed.

## 2012-04-28 ENCOUNTER — Encounter: Payer: Self-pay | Admitting: Physical Medicine & Rehabilitation

## 2012-04-28 ENCOUNTER — Ambulatory Visit (HOSPITAL_BASED_OUTPATIENT_CLINIC_OR_DEPARTMENT_OTHER): Payer: Medicare Other | Admitting: Physical Medicine & Rehabilitation

## 2012-04-28 ENCOUNTER — Encounter: Payer: Medicare Other | Attending: Physical Medicine & Rehabilitation

## 2012-04-28 VITALS — BP 137/86 | HR 109 | Resp 16 | Ht 66.0 in | Wt 190.0 lb

## 2012-04-28 DIAGNOSIS — M47817 Spondylosis without myelopathy or radiculopathy, lumbosacral region: Secondary | ICD-10-CM

## 2012-04-28 DIAGNOSIS — E785 Hyperlipidemia, unspecified: Secondary | ICD-10-CM | POA: Insufficient documentation

## 2012-04-28 DIAGNOSIS — I10 Essential (primary) hypertension: Secondary | ICD-10-CM | POA: Insufficient documentation

## 2012-04-28 DIAGNOSIS — M109 Gout, unspecified: Secondary | ICD-10-CM | POA: Insufficient documentation

## 2012-04-28 DIAGNOSIS — J438 Other emphysema: Secondary | ICD-10-CM | POA: Insufficient documentation

## 2012-04-28 DIAGNOSIS — M545 Low back pain, unspecified: Secondary | ICD-10-CM | POA: Insufficient documentation

## 2012-04-28 DIAGNOSIS — M1710 Unilateral primary osteoarthritis, unspecified knee: Secondary | ICD-10-CM | POA: Insufficient documentation

## 2012-04-28 DIAGNOSIS — E119 Type 2 diabetes mellitus without complications: Secondary | ICD-10-CM | POA: Insufficient documentation

## 2012-04-28 DIAGNOSIS — M5430 Sciatica, unspecified side: Secondary | ICD-10-CM | POA: Insufficient documentation

## 2012-04-28 NOTE — Progress Notes (Signed)
Left Lumbar L2, L3, L4  medial branch blocks and L 5 dorsal ramus injection under fluoroscopic guidance   Indication: Left Lumbar pain which is not relieved by medication management or other conservative care and interfering with self-care and mobility.  Informed consent was obtained after describing risks and benefits of the procedure with the patient, this includes bleeding, bruising, infection, paralysis and medication side effects.  The patient wishes to proceed and has given written consent.  The patient was placed in a prone position.  The lumbar area was marked and prepped with Betadine.  One mL of 1% lidocaine was injected into each of 3 areas into the skin and subcutaneous tissue.  Then a 22-gauge 3.5 inch spinal needle was inserted targeting the junction of the left S1 superior articular process and sacral ala junction.  Needle was advanced under fluoroscopic guidance.  Bone contact was made.  Omnipaque 180 was injected x 0.5 mL demonstrating no intravascular uptake.  Then a solution containing one mL of 4 mg per mL dexamethasone and 3 mL of 2% MPF lidocaine was injected x 0.5 mL.  Then the left L5 superior articular process in transverse process junction was targeted.  Bone contact was made.  Omnipaque 180 was injected x 0.5 mL demonstrating no intravascular uptake.  Then a solution containing one mL of 4 mg per mL dexamethasone and 3 mL of 2% MPF lidocaine was injected x 0.5 mL.  Then the left L3 and  L4 superior articular process in transverse process junction was targeted.  Bone contact was made.  Omnipaque 180 was injected x 0.5 mL demonstrating no intravascular uptake.  Then a solution containing one mL of 4 mg per mL dexamethasone and 3 mL of 2% MPF lidocaine was injected x 0.5 mL at each site.  Patient tolerated procedure well.  Post procedure instructions were given.

## 2012-04-28 NOTE — Progress Notes (Signed)
  PROCEDURE RECORD [facility]   Name: Orion Mole DOB:1956/03/12 MRN: [id number]  Date:04/28/2012  Physician: Claudette Laws, MD    Nurse/CMA: Redgie Grayer  Allergies: No Known Allergies  Consent Signed: yes  Is patient diabetic? yes  CBG today? Didn't check this AM  Pregnant: no LMP: No LMP for male patient. (age 57-55)  Anticoagulants: no Anti-inflammatory: no Antibiotics: no  Procedure: Medial Branch Block  Position: Prone Start Time: 11:37am  End Time: 11:46am  Fluoro Time: 25 seconds  RN/CMA Carroll,CMA Carroll,CMA    Time 11:10am 11:50am    BP 137/86 135/87    Pulse 109 102    Respirations 16 16    O2 Sat 98% 100%    S/S 6 6    Pain Level 8/10 8/10     D/C home with Karena Addison (friend), patient A & O X 3, D/C instructions reviewed, and sits independently.

## 2012-04-28 NOTE — Patient Instructions (Signed)
L2, L3, L4, L5 medial branch block. We injected dexamethasone and lidocaine as well as a small amount of Omnipaque contrast We will see how this helps with your pain. Will discuss whether we need to do a radiofrequency procedure next visit. I have given you a pamphlet on this

## 2012-05-20 ENCOUNTER — Other Ambulatory Visit: Payer: Self-pay

## 2012-05-20 ENCOUNTER — Other Ambulatory Visit: Payer: Self-pay | Admitting: Physical Medicine & Rehabilitation

## 2012-05-20 ENCOUNTER — Telehealth: Payer: Self-pay

## 2012-05-20 NOTE — Telephone Encounter (Signed)
Patient is requesting refill on Ultram-ER.

## 2012-05-20 NOTE — Telephone Encounter (Signed)
Ultram escribed 05/20/12 with 2 refills.

## 2012-05-27 ENCOUNTER — Ambulatory Visit: Payer: Medicare Other | Admitting: Physical Medicine & Rehabilitation

## 2012-05-27 ENCOUNTER — Encounter: Payer: Medicare Other | Attending: Physical Medicine & Rehabilitation

## 2012-05-27 DIAGNOSIS — M1710 Unilateral primary osteoarthritis, unspecified knee: Secondary | ICD-10-CM | POA: Insufficient documentation

## 2012-05-27 DIAGNOSIS — M545 Low back pain, unspecified: Secondary | ICD-10-CM | POA: Insufficient documentation

## 2012-05-27 DIAGNOSIS — E119 Type 2 diabetes mellitus without complications: Secondary | ICD-10-CM | POA: Insufficient documentation

## 2012-05-27 DIAGNOSIS — M109 Gout, unspecified: Secondary | ICD-10-CM | POA: Insufficient documentation

## 2012-05-27 DIAGNOSIS — J438 Other emphysema: Secondary | ICD-10-CM | POA: Insufficient documentation

## 2012-05-27 DIAGNOSIS — M5430 Sciatica, unspecified side: Secondary | ICD-10-CM | POA: Insufficient documentation

## 2012-05-27 DIAGNOSIS — I10 Essential (primary) hypertension: Secondary | ICD-10-CM | POA: Insufficient documentation

## 2012-05-27 DIAGNOSIS — E785 Hyperlipidemia, unspecified: Secondary | ICD-10-CM | POA: Insufficient documentation

## 2012-06-17 ENCOUNTER — Encounter: Payer: Medicare Other | Attending: Physical Medicine & Rehabilitation

## 2012-06-17 ENCOUNTER — Ambulatory Visit: Payer: Medicare Other | Admitting: Physical Medicine & Rehabilitation

## 2012-06-17 DIAGNOSIS — M545 Low back pain, unspecified: Secondary | ICD-10-CM | POA: Insufficient documentation

## 2012-06-17 DIAGNOSIS — M5430 Sciatica, unspecified side: Secondary | ICD-10-CM | POA: Insufficient documentation

## 2012-06-17 DIAGNOSIS — J438 Other emphysema: Secondary | ICD-10-CM | POA: Insufficient documentation

## 2012-06-17 DIAGNOSIS — I10 Essential (primary) hypertension: Secondary | ICD-10-CM | POA: Insufficient documentation

## 2012-06-17 DIAGNOSIS — M1710 Unilateral primary osteoarthritis, unspecified knee: Secondary | ICD-10-CM | POA: Insufficient documentation

## 2012-06-17 DIAGNOSIS — E119 Type 2 diabetes mellitus without complications: Secondary | ICD-10-CM | POA: Insufficient documentation

## 2012-06-17 DIAGNOSIS — T50A95A Adverse effect of other bacterial vaccines, initial encounter: Secondary | ICD-10-CM | POA: Insufficient documentation

## 2012-06-17 DIAGNOSIS — E785 Hyperlipidemia, unspecified: Secondary | ICD-10-CM | POA: Insufficient documentation

## 2012-06-17 DIAGNOSIS — M109 Gout, unspecified: Secondary | ICD-10-CM | POA: Insufficient documentation

## 2012-07-24 ENCOUNTER — Other Ambulatory Visit: Payer: Self-pay | Admitting: Physical Medicine & Rehabilitation

## 2012-08-10 ENCOUNTER — Encounter: Payer: Self-pay | Admitting: *Deleted

## 2012-08-11 ENCOUNTER — Ambulatory Visit (INDEPENDENT_AMBULATORY_CARE_PROVIDER_SITE_OTHER): Payer: Medicare Other | Admitting: Internal Medicine

## 2012-08-11 ENCOUNTER — Encounter: Payer: Self-pay | Admitting: Internal Medicine

## 2012-08-11 VITALS — BP 130/88 | HR 93 | Temp 98.2°F | Ht 66.0 in | Wt 208.2 lb

## 2012-08-11 DIAGNOSIS — J449 Chronic obstructive pulmonary disease, unspecified: Secondary | ICD-10-CM | POA: Insufficient documentation

## 2012-08-11 NOTE — Progress Notes (Signed)
Subjective:    Patient ID: James Zuniga, male    DOB: 08/05/1955, 57 y.o.   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  reports that he quit smoking about 5 weeks ago. His smoking use included Cigarettes. He has a 35 pack-year smoking history. He has never used smokeless tobacco.    IOV 08/11/2012  57 year old states he has based "copd and emphysema" on spiriva/advair x 6 year but "now breathing gets worse". Reports dyspnea for "always" in time. Insidious onset. Dyspnea getting worse 3-4 months. Reports dyspnea for 1-2 flight of stairs, dyspnea bed to bathroom and back. Quit smoking 5 weeks ago but dyspnea still getting worse. Rates dyspnea as moderate to severe. Dyspnea is associated with chest pain especially in upper central sternal area. Dyspnea only occ associated with cough or wheezing but never any sputum. There is associated NEW occ. Paroxysmal nocturnal dyspnea x 4 months but unchanged orthopnea (3 pillows) x 6 years . THere is no asssociated edema currently or weight loss, fever.    Of note,   A) COPD diagnosed when he lived in [place], [place] - apparently was seeing pulmonary there. REcollects PFT but does not severity. On spiriva and advair x 6  Years.   B) CHF diagnosis - made 6 years ago in [place]. Does not know EF. Going to see a cardiologist here in [place] on 08/25/12 but does not know who he is seeing. He recollects  ? Cardiac cath and ? 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 Since then weight gain from 170# to 202# which is his baseline but at Body mass index is 33.60 kg/(m^2).   CAT COPD Symptom & Quality of Life Score (GSK trademark) 0 is no burden. 5 is highest burden 08/11/2012   Never Cough -> Cough all the time 3  No phlegm in chest  -> Chest is full of phlegm 0  No chest tightness -> Chest feels very tight 2  No dyspnea for 1 flight stairs/[place] -> Very dyspneic for 1 flight of stairs 5  No limitations for ADL at home -> Very limited with ADL at home 4  Confident leaving home -> Not at all confident leaving home 3  Sleep soundly -> Do not sleep soundly because of lung condition 4  Lots of Energy -> No energy at all 4  TOTAL Score (max 40)  26      CXR  May 2013   - clear lung fields, personally confirmed  Past Medical History  Diagnosis Date  . CHF (congestive heart failure)   . Coronary artery disease   . Diabetes mellitus   . Hypertension   . Renal disorder   . Hyperlipidemia      Family History  Problem Relation Age of Onset  . Heart disease Mother   . Stroke Brother   . Diabetes Father      History   Social History  . Marital Status: Unknown    Spouse Name: N/A    Number of Children: N/A  . Years of Education: N/A   Occupational History  . Not on file.   Social History Main Topics  . Smoking status: Former Smoker -- 1.0 packs/day for  35 years    Types: Cigarettes    Quit date: 07/01/2012  . Smokeless tobacco: Never Used  . Alcohol Use: No  . Drug Use: Not on file  . Sexually Active: Not on file   Other Topics Concern  . Not on file   Social History Narrative  . No narrative on file     No Known Allergies   Outpatient Prescriptions Prior to Visit  Medication Sig Dispense Refill  . albuterol (PROVENTIL HFA;VENTOLIN HFA) 108 (90 BASE) MCG/ACT inhaler Inhale 2 puffs into the lungs every 6 (six) hours as needed. For shortness of breath      . allopurinol (ZYLOPRIM) 300 MG tablet Take 300 mg by mouth daily.      Marland Kitchen amLODipine (NORVASC) 5 MG tablet Take 5 mg by mouth daily.      . ANDROGEL PUMP 12.5 MG/ACT (1%) GEL       . carvedilol (COREG) 12.5 MG tablet Take 12.5 mg by mouth 2 (two) times daily with a meal.      . Fluticasone-Salmeterol (ADVAIR) 250-50 MCG/DOSE AEPB Inhale 1 puff  into the lungs every 12 (twelve) hours.      . gabapentin (NEURONTIN) 300 MG capsule Take 300 mg by mouth 3 (three) times daily.       Marland Kitchen ibuprofen (ADVIL,MOTRIN) 600 MG tablet Take 600 mg by mouth 2 (two) times daily.      . metFORMIN (GLUCOPHAGE) 500 MG tablet Take 500 mg by mouth 2 (two) times daily with a meal.      . olmesartan (BENICAR) 20 MG tablet Take 20 mg by mouth daily.      Marland Kitchen omeprazole (PRILOSEC) 20 MG capsule Take 20 mg by mouth every morning.      . potassium chloride (K-DUR,KLOR-CON) 10 MEQ tablet Take 10 mEq by mouth daily.       . sildenafil (VIAGRA) 100 MG tablet Take 100 mg by mouth daily as needed. For E.D.      . tiotropium (SPIRIVA) 18 MCG inhalation capsule Place 18 mcg into inhaler and inhale daily.      . traMADol (ULTRAM-ER) 100 MG 24 hr tablet TAKE 1 TABLET BY MOUTH DAILY  30 tablet  2  . varenicline (CHANTIX PAK) 0.5 MG X 11 & 1 MG X 42 tablet Take by mouth 2 (two) times daily. Take one 0.5 mg tablet by mouth once daily for 3 days, then increase to one 0.5 mg tablet twice daily for 4 days, then increase to one 1 mg tablet twice daily.      . VOLTAREN 1 % GEL APPLY 4 GRAMS TOPICALLY FOUR TIMES DAILY  500 g  0  . zolpidem (AMBIEN) 5 MG tablet Take 5 mg by mouth at bedtime.      . [DISCONTINUED] amitriptyline (ELAVIL) 25 MG tablet Take 1 tablet by mouth Twice daily.      . [DISCONTINUED] cyclobenzaprine (FLEXERIL) 10 MG tablet Take 1 tablet (10 mg total) by mouth 3 (three) times daily as needed for muscle spasms.  90 tablet  1  . [DISCONTINUED] diclofenac (VOLTAREN) 75 MG EC tablet Take 75 mg by mouth 2 (two) times daily.      . [DISCONTINUED] potassium chloride (K-DUR) 10 MEQ tablet Take 10 mEq by mouth daily.      . [DISCONTINUED] pravastatin (PRAVACHOL) 10 MG tablet Take 20 mg by mouth at bedtime.       Last reviewed on 08/11/2012 10:30 AM by Darrell Jewel, CMA  Review of Systems  Constitutional: Negative for fever and unexpected weight change.  HENT:  Positive for sore throat and dental problem. Negative for ear pain, nosebleeds, congestion, rhinorrhea, sneezing, trouble swallowing, postnasal drip and sinus pressure.   Eyes: Negative for redness and itching.  Respiratory: Positive for cough and shortness of breath. Negative for chest tightness and wheezing.   Cardiovascular: Positive for leg swelling. Negative for palpitations.  Gastrointestinal: Negative for nausea and vomiting.  Genitourinary: Negative for dysuria.  Musculoskeletal: Negative for joint swelling.  Skin: Negative for rash.  Neurological: Negative for headaches.  Hematological: Does not bruise/bleed easily.  Psychiatric/Behavioral: Negative for dysphoric mood. The patient is not nervous/anxious.        Objective:   Physical Exam  Nursing note and vitals reviewed. Constitutional: He is oriented to person, place, and time. He appears well-developed and well-nourished. No distress.  HENT:  Head: Normocephalic and atraumatic.  Right Ear: External ear normal.  Left Ear: External ear normal.  Mouth/Throat: Oropharynx is clear and moist. No oropharyngeal exudate.  Eyes: Conjunctivae normal and EOM are normal. Pupils are equal, round, and reactive to light. Right eye exhibits no discharge. Left eye exhibits no discharge. No scleral icterus.  Neck: Normal range of motion. Neck supple. No JVD present. No tracheal deviation present. No thyromegaly present.  Cardiovascular: Normal rate, regular rhythm and intact distal pulses.  Exam reveals no gallop and no friction rub.   No murmur heard. Pulmonary/Chest: Effort normal and breath sounds normal. No respiratory distress. He has no wheezes. He has no rales. He exhibits no tenderness.  Abdominal: Soft. Bowel sounds are normal. He exhibits no distension and no mass. There is no tenderness. There is no rebound and no guarding.  Musculoskeletal: Normal range of motion. He exhibits no edema and no tenderness.       Walks with cane   Lymphadenopathy:    He has no cervical adenopathy.  Neurological: He is alert and oriented to person, place, and time. He has normal reflexes. No cranial nerve deficit. Coordination normal.  Skin: Skin is warm and dry. No rash noted. He is not diaphoretic. No erythema. No pallor.  Psychiatric: He has a normal mood and affect. His behavior is normal. Judgment and thought content normal.          Assessment & Plan:

## 2012-08-11 NOTE — Assessment & Plan Note (Signed)
Unclear if shortness of breath is worse because severe COPD is worse or because heart problems or because of fitness issues Please have full breathing test called pulmonary function test as soon as possible I will also order overnight oxygen study for you when you sleeping at home Return in the next 2-4 weeks to review results of the above Meanwhile, please make sure you keep up with her cardiologist. Please call our office and let us know who the cardiologist is That you're going to see

## 2012-08-11 NOTE — Patient Instructions (Addendum)
Unclear if shortness of breath is worse because severe COPD is worse or because heart problems or because of fitness issues Please have full breathing test called pulmonary function test as soon as possible I will also order overnight oxygen study for you when you sleeping at home Return in the next 2-4 weeks to review results of the above Meanwhile, please make sure you keep up with her cardiologist. Please call our office and let us know who the cardiologist is That you're going to see 

## 2012-08-29 ENCOUNTER — Telehealth: Payer: Self-pay | Admitting: Internal Medicine

## 2012-08-29 DIAGNOSIS — J441 Chronic obstructive pulmonary disease with (acute) exacerbation: Secondary | ICD-10-CM

## 2012-08-29 NOTE — Telephone Encounter (Signed)
I am seeing in 08/31/2012. Overnight oxygen study on 08/18/2012 showed total pulse ox of less than 88% with 4 minutes and 52 seconds pulse ox less than 89% at 6 minutes and 20 seconds. Total time with a decrease in saturation more than 5 below the mean was 6 minutes and 20 seconds. Highest pulse ox was 100%. Lowest pulse ox was 84%. Mean pulse ox was 95.3%  I would discuss these results when I see him

## 2012-08-31 ENCOUNTER — Ambulatory Visit: Payer: Medicare Other | Admitting: Internal Medicine

## 2012-09-01 NOTE — Telephone Encounter (Signed)
Tell him ozygen desaturations borderline at night. IF he wants he can try 1L [place] at night but if this iiconvneient we can retest him in 1 year

## 2012-09-01 NOTE — Telephone Encounter (Signed)
Pt did not come in for OV on 08-31-12. He rescheduled to 09-16-12 but if he needs oxygen we need to order before then. Please advise. Carron Curie, CMA

## 2012-09-05 NOTE — Telephone Encounter (Signed)
I spoke with the pt and he states he would like to try the 1 liter at bedtime. Order placed. Pt is aware. Carron Curie, CMA

## 2012-09-16 ENCOUNTER — Ambulatory Visit: Payer: Medicare Other | Admitting: Internal Medicine

## 2012-09-21 ENCOUNTER — Telehealth: Payer: Self-pay | Admitting: Internal Medicine

## 2012-09-21 DIAGNOSIS — J449 Chronic obstructive pulmonary disease, unspecified: Secondary | ICD-10-CM

## 2012-09-21 NOTE — Telephone Encounter (Signed)
Spoke with patient, patient states that since being started on 1L o2 via [place] at bedtime 8-10days ago he just does not feel this is doing much good. Patient states he sometimes will put the o2 on throughout the day and esp in the evening as well if he feels he is having some diff breathing. States he still gets very SOB, feels the o2 is "weak" and would like to increase it to 2L. Dr. Marchelle Gearing please advise, thank you!  Last OV:08/11/12 Patient Instructions    Unclear if shortness of breath is worse because severe COPD is worse or because heart problems or because of fitness issues  Please have full breathing test called pulmonary function test as soon as possible  I will also order overnight oxygen study for you when you sleeping at home  Return in the next 2-4 weeks to review results of the above  Meanwhile, please make sure you keep up with her cardiologist. Please call our office and let us know who the cardiologist is That you're going to see    Next OV: in 1 yr ? According to telephone note:   Kalman Shan, MD at 09/01/2012 6:07 PM    Status: Signed             Tell him ozygen desaturations borderline at night. IF he wants he can try 1L [place] at night but if this iiconvneient we can retest him in 1 year

## 2012-09-23 NOTE — Telephone Encounter (Signed)
Okay to increase back to 2 L nasal cannula at night

## 2012-09-23 NOTE — Telephone Encounter (Signed)
Called, spoke with pt.  Advised MR ok's him to increase o2 to 2 lpm via [place] [facility] and order will be sent to [facility] for this change.  He verbalized understanding, is aware of pending OV with MR on October 18, 2012 at 1:30 and voiced no further questions or concerns at this time.

## 2012-10-18 ENCOUNTER — Encounter: Payer: Self-pay | Admitting: Internal Medicine

## 2012-10-18 ENCOUNTER — Ambulatory Visit (INDEPENDENT_AMBULATORY_CARE_PROVIDER_SITE_OTHER): Payer: Medicare Other | Admitting: Internal Medicine

## 2012-10-18 ENCOUNTER — Telehealth: Payer: Self-pay | Admitting: Internal Medicine

## 2012-10-18 VITALS — BP 112/82 | HR 83 | Temp 98.2°F | Ht 66.0 in | Wt 214.6 lb

## 2012-10-18 DIAGNOSIS — J4489 Other specified chronic obstructive pulmonary disease: Secondary | ICD-10-CM

## 2012-10-18 DIAGNOSIS — J449 Chronic obstructive pulmonary disease, unspecified: Secondary | ICD-10-CM

## 2012-10-18 DIAGNOSIS — Z129 Encounter for screening for malignant neoplasm, site unspecified: Secondary | ICD-10-CM

## 2012-10-18 DIAGNOSIS — J441 Chronic obstructive pulmonary disease with (acute) exacerbation: Secondary | ICD-10-CM

## 2012-10-18 MED ORDER — FLUTICASONE-SALMETEROL 250-50 MCG/DOSE IN AEPB: 1.0000 | INHALATION_SPRAY | Freq: Two times a day (BID) | RESPIRATORY_TRACT | Status: DC

## 2012-10-18 MED ORDER — TIOTROPIUM BROMIDE MONOHYDRATE 18 MCG IN CAPS: 18.0000 ug | ORAL_CAPSULE | Freq: Every day | RESPIRATORY_TRACT | Status: DC

## 2012-10-18 MED ORDER — ALBUTEROL SULFATE HFA 108 (90 BASE) MCG/ACT IN AERS: 2.0000 | INHALATION_SPRAY | Freq: Four times a day (QID) | RESPIRATORY_TRACT | Status: DC | PRN

## 2012-10-18 NOTE — Assessment & Plan Note (Signed)
#  lung cancer screening I discussed screening Ct chest for early detection of lung cancer Explained that in age 58-75 and smoking history, annual low dose CT chest can pick up lung cancer early and has potential to save lives and cure lung cancer This is similar in concept to screening mammogram, colonoscopies and pap smears I explained Ct scan is low dose radiation I explained early lung cancer asymptomatic and only way to  detect is CT  With the real advantage that early lung cancer is curable through radiation or surgery I explained CT superior to CXR I explained that false positives are present and can incur cost and workup like biopsies, additional scan but benefit outweighs risk I explained currently out of pocket $300 I recommend one a year  He first wants to save money before he has a CT scan

## 2012-10-18 NOTE — Telephone Encounter (Signed)
Victorino Dike,  Please call Dr. Vernona Rieger [facility] in [facility] His phone number is [phone]. Please let him know that patient has COPD and I would like carvedilol discontinued and switched to another class of medications or a beta-1 specific beta blocker. If he needs to talk to me you can give him my cell phone number  Thank you  Dr. Kalman Shan, M.D., [facility].C.P Pulmonary and Critical Care Medicine Staff Physician [facility] [place] [facility] Pager: [phone], If no answer or between  15:00h - 7:00h: call [phone]  10/18/2012 2:05 PM

## 2012-10-18 NOTE — Assessment & Plan Note (Signed)
#  COPD - Currently stable disease  - Please continue taking albuterol as needed, daily Spiriva and daily Advair - Use oxygen as needed and the daytime and continuous at night while sleeping - You will get samples of your inhalers today - We will also make sure you have refills - Please get breathing test called full pulmonary function test as soon as possible - I have referred you to pulmonary rehabilitation; it will take 2 months to start  #Congestive heart failure - Please give me the name of  cardiologist in [place].- Plese call your cardiologist and have him change your carvedilol to another drug because carvedilol can make COPD worse   #Followup - 3 months or sooner if needed -COPD cat score at followup

## 2012-10-18 NOTE — Progress Notes (Signed)
Subjective:    Patient ID: James Zuniga, male    DOB: [birth year], 57 y.o.   MRN: [id number]  HPI   PCP is BADGER,MICHAEL C, MD Body mass index is 33.60 kg/(m^2).  reports that he quit smoking about 5 weeks ago. His smoking use included Cigarettes. He has a 35 pack-year smoking history. He has never used smokeless tobacco.    IOV 08/11/2012  57 year old states he has based "copd and emphysema" on spiriva/advair x 6 year but "now breathing gets worse". Reports dyspnea for "always" in time. Insidious onset. Dyspnea getting worse 3-4 months. Reports dyspnea for 1-2 flight of stairs, dyspnea bed to bathroom and back. Quit smoking 5 weeks ago but dyspnea still getting worse. Rates dyspnea as moderate to severe. Dyspnea is associated with chest pain especially in upper central sternal area. Dyspnea only occ associated with cough or wheezing but never any sputum. There is associated NEW occ. Paroxysmal nocturnal dyspnea x 4 months but unchanged orthopnea (3 pillows) x 6 years . THere is no asssociated edema currently or weight loss, fever.    Of note,   A) COPD diagnosed when he lived in [place], [place] - apparently was seeing pulmonary there. REcollects PFT but does not severity. On spiriva and advair x 6  Years.   B) CHF diagnosis - made 6 years ago in [place]. Does not know EF. Going to see a cardiologist here in [place] on 08/25/12 but does not know who he is seeing. He recollects  ? Cardiac cath and ? 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 Since then weight gain from 170# to 202# which is his baseline but at Body mass index is 33.60 kg/(m^2).   CAT COPD Symptom & Quality of Life Score (GSK trademark) 0 is no burden. 5 is highest burden 08/11/2012   Never Cough -> Cough all the time 3  No phlegm in  chest -> Chest is full of phlegm 0  No chest tightness -> Chest feels very tight 2  No dyspnea for 1 flight stairs/[place] -> Very dyspneic for 1 flight of stairs 5  No limitations for ADL at home -> Very limited with ADL at home 4  Confident leaving home -> Not at all confident leaving home 3  Sleep soundly -> Do not sleep soundly because of lung condition 4  Lots of Energy -> No energy at all 4  TOTAL Score (max 40)  26      CXR  May 2013   - clear lung fields, personally confirmed    OV 10/18/2012 Followup COPD. Presents with his daughter  - He missed a few appointments because of bad weather. Therefore he also did not have his pulmonary function test. In the interim he did have overnight oxygen study and he is now using oxygen while sleeping and as needed during the daytime. He has oxygen helping his dyspnea but overall he rates his dyspnea and his quality of life is stable without any change. He continues with Spiriva daily Advair. His [facility]ologist is at [facility] note is that he uses  Carvedilol. Patient is open to the idea of pulmonary rehabilitation if he can get his logistics sorted out. We discussed lung cancer screening; he is open to it  but wants to save money first  Past, Family, Social reviewed: no change since last visit    Review of Systems  Constitutional: Negative for fever and unexpected weight change.  HENT: Negative for ear pain, nosebleeds, congestion, sore throat, rhinorrhea, sneezing, trouble swallowing, dental problem, postnasal drip and sinus pressure.   Eyes: Negative for redness and itching.  Respiratory: Positive for chest tightness, shortness of breath and wheezing. Negative for cough.   Cardiovascular: Negative for palpitations and leg swelling.  Gastrointestinal: Negative for nausea and vomiting.  Genitourinary: Negative for dysuria.  Musculoskeletal: Negative for joint swelling.  Skin: Negative for rash.  Neurological: Positive for dizziness. Negative  for headaches.  Hematological: Does not bruise/bleed easily.  Psychiatric/Behavioral: Negative for dysphoric mood. The patient is not nervous/anxious.       Current outpatient prescriptions:albuterol (PROVENTIL HFA;VENTOLIN HFA) 108 (90 BASE) MCG/ACT inhaler, Inhale 2 puffs into the lungs every 6 (six) hours as needed. For shortness of breath, Disp: , Rfl: ;  allopurinol (ZYLOPRIM) 300 MG tablet, Take 300 mg by mouth daily., Disp: , Rfl: ;  amLODipine (NORVASC) 5 MG tablet, Take 5 mg by mouth daily., Disp: , Rfl: ;  ANDROGEL PUMP 12.5 MG/ACT (1%) GEL, , Disp: , Rfl:  carvedilol (COREG) 12.5 MG tablet, Take 12.5 mg by mouth 2 (two) times daily with a meal., Disp: , Rfl: ;  Fluticasone-Salmeterol (ADVAIR) 250-50 MCG/DOSE AEPB, Inhale 1 puff into the lungs every 12 (twelve) hours., Disp: , Rfl: ;  gabapentin (NEURONTIN) 300 MG capsule, Take 300 mg by mouth 3 (three) times daily. , Disp: , Rfl: ;  ibuprofen (ADVIL,MOTRIN) 600 MG tablet, Take 600 mg by mouth 2 (two) times daily., Disp: , Rfl:  metFORMIN (GLUCOPHAGE) 500 MG tablet, Take 500 mg by mouth 2 (two) times daily with a meal., Disp: , Rfl: ;  olmesartan (BENICAR) 20 MG tablet, Take 20 mg by mouth daily., Disp: , Rfl: ;  omeprazole (PRILOSEC) 20 MG capsule, Take 20 mg by mouth every morning., Disp: , Rfl: ;  potassium chloride (K-DUR,KLOR-CON) 10 MEQ tablet, Take 10 mEq by mouth daily. , Disp: , Rfl:  sildenafil (VIAGRA) 100 MG tablet, Take 100 mg by mouth daily as needed. For E.D., Disp: , Rfl: ;  simvastatin (ZOCOR) 20 MG tablet, Take 1 tablet by mouth Daily., Disp: , Rfl: ;  tiotropium (SPIRIVA) 18 MCG inhalation capsule, Place 18 mcg into inhaler and inhale daily., Disp: , Rfl: ;  tiZANidine (ZANAFLEX) 2 MG tablet, Take 1 tablet by mouth as needed., Disp: , Rfl:  traMADol (ULTRAM-ER) 100 MG 24 hr tablet, TAKE 1 TABLET BY MOUTH DAILY, Disp: 30 tablet, Rfl: 2;  varenicline (CHANTIX PAK) 0.5 MG X 11 & 1 MG X 42 tablet, Take by mouth 2 (two) times daily.  Take one 0.5 mg tablet by mouth once daily for 3 days, then increase to one 0.5 mg tablet twice daily for 4 days, then increase to one 1 mg tablet twice daily., Disp: , Rfl: ;  VOLTAREN 1 % GEL, APPLY 4 GRAMS TOPICALLY FOUR TIMES DAILY, Disp: 500 g, Rfl: 0 zolpidem (AMBIEN) 5 MG tablet, Take 5 mg by mouth at bedtime., Disp: , Rfl:   Objective:   Physical Exam  Physical Exam  Nursing note and vitals reviewed. Constitutional: He is oriented to person, place, and time. He appears well-developed and well-nourished. No distress.  HENT:  Head: Normocephalic and atraumatic.  Right Ear: External ear normal.  Left Ear: External ear normal.  Mouth/Throat: Oropharynx is  clear and moist. No oropharyngeal exudate.  Eyes: Conjunctivae normal and EOM are normal. Pupils are equal, round, and reactive to light. Right eye exhibits no discharge. Left eye exhibits no discharge. No scleral icterus.  Neck: Normal range of motion. Neck supple. No JVD present. No tracheal deviation present. No thyromegaly present.  Cardiovascular: Normal rate, regular rhythm and intact distal pulses.  Exam reveals no gallop and no friction rub.   No murmur heard. Pulmonary/Chest: Effort normal and breath sounds normal. No respiratory distress. He has no wheezes. He has no rales. He exhibits no tenderness.  Abdominal: Soft. Bowel sounds are normal. He exhibits no distension and no mass. There is no tenderness. There is no rebound and no guarding.  Musculoskeletal: Normal range of motion. He exhibits no edema and no tenderness.       Walks with cane  Lymphadenopathy:    He has no cervical adenopathy.  Neurological: He is alert and oriented to person, place, and time. He has normal reflexes. No cranial nerve deficit. Coordination normal.  Skin: Skin is warm and dry. No rash noted. He is not diaphoretic. No erythema. No pallor.  Psychiatric: He has a normal mood and affect. His behavior is normal. Judgment and thought content normal.            Assessment & Plan:

## 2012-10-18 NOTE — Patient Instructions (Addendum)
#  COPD - Currently stable disease  - Please continue taking albuterol as needed, daily Spiriva and daily Advair - Use oxygen as needed and the daytime and continuous at night while sleeping - You will get samples of your inhalers today - We will also make sure you have refills - Please get breathing test called full pulmonary function test as soon as possible - I have referred you to pulmonary rehabilitation; it will take 2 months to start  #Congestive heart failure - Please give me the name of  cardiologist in [place].- Plese call your cardiologist and have him change your carvedilol to another drug because carvedilol can make COPD worse  #Lung cancer screening - We discussed this and you have agree to save $300 to have the CT scan of the chest  #Followup - 3 months or sooner if needed -COPD cat score at followup

## 2012-10-26 NOTE — Telephone Encounter (Signed)
LMTCBx1 with nurse.Carron Curie, CMA

## 2012-10-26 NOTE — Telephone Encounter (Signed)
I spoke with Gearldine Bienenstock, Dr. Judithe Modest nurse and advised of need to change medication. Carron Curie, CMA

## 2012-11-04 ENCOUNTER — Telehealth: Payer: Self-pay | Admitting: Internal Medicine

## 2012-11-04 DIAGNOSIS — J449 Chronic obstructive pulmonary disease, unspecified: Secondary | ICD-10-CM

## 2012-11-04 NOTE — Telephone Encounter (Signed)
I LMTCBx1 to speak with Merry Proud to clarify if they are going to change the pt to another medication? Or is the pt just to stop the coreg without a replacement? Have they notified the patient?  Carron Curie, CMA

## 2012-11-04 NOTE — Telephone Encounter (Signed)
I spoke with Mick Sell and she states that Dr. Judithe Modest stated that it was beneficial for the pt to continue taking the coreg from a cardiology standpoint but if MR felt that it was worsening his COPD then we can advise the pt to stop the medication. Dr. Judithe Modest did not want to start the pt on another medication if he stops the coreg. MR please clarify if you want the pt to stop this medication or do you want to speak to Dr. Judithe Modest first? Please advise. Carron Curie, CMA

## 2012-11-04 NOTE — Telephone Encounter (Signed)
lmtcb x1 w/ triage nurse tcb

## 2012-11-04 NOTE — Telephone Encounter (Signed)
Brandi @ Dr. Maurine Minister office returned call.  She states that it is  "ok to stop coreg per nurse". Brandi did not know what else (if anything was needed from our nurse in triage). Hazel Sams

## 2012-11-11 NOTE — Telephone Encounter (Signed)
ATC Dr. Perry Mount office and the phone just keep ringing. WCB

## 2012-11-11 NOTE — Telephone Encounter (Signed)
MR, please advise. Thanks!  

## 2012-11-11 NOTE — Telephone Encounter (Signed)
I would like to talk to Dr Judithe Modest. Can you get his number for me please or you can give his office my cell ?  Thanks  MR

## 2012-11-14 NOTE — Telephone Encounter (Signed)
I have already provided your cell number to them. Dr. Netta Neat contact number is [phone], press option 2, I would advise to try to call him at this number.Carron Curie, CMA

## 2012-11-22 NOTE — Telephone Encounter (Signed)
MR have you been able to contact Dr Judithe Modest? May triage close this message? Thanks.

## 2012-11-30 NOTE — Telephone Encounter (Signed)
Yes please call in 30 day with 1 refills. After that get from cards   Dr. Kalman Shan, M.D., [facility].C.P Pulmonary and Critical Care Medicine Staff Physician [facility]  Pulmonary and Critical Care Pager: [phone], If no answer or between  15:00h - 7:00h: call [phone]  11/30/2012 5:32 PM

## 2012-11-30 NOTE — Telephone Encounter (Signed)
Do we need to call in RX for toprol-XL 50 mg? Please advise Dr. Marchelle Gearing thanks

## 2012-11-30 NOTE — Telephone Encounter (Signed)
Spoke to Dr Judithe Modest 4:44 PM on 11/30/2012  - dc coreg - start toprol-XL 50mg  once daily - have patient followup with Dr Judithe Modest - ensure patient ful with me per prior OV   Thanks  Dr. Kalman Shan, M.D., [facility].C.P Pulmonary and Critical Care Medicine Staff Physician [facility] Pulmonary and Critical Care Pager: [phone], If no answer or between  15:00h - 7:00h: call [phone]  11/30/2012 4:44 PM

## 2012-12-01 MED ORDER — METOPROLOL SUCCINATE ER 50 MG PO TB24: 50.0000 mg | ORAL_TABLET | Freq: Every day | ORAL | Status: DC

## 2012-12-01 NOTE — Telephone Encounter (Signed)
I spoke with the pt and advised of med change and that Rx was sent to pharmacy. Pt also had a question about getting portable oxygen. According to chart he only uses oxygen at bedtime so I advised he will need to come in and be tested for portable oxygen. Pt is due for OV in July and states he wants to wait until then so appt set for 01-10-13. The pt also requests that an order be sent for a humidifier for his oxygen so his nose does not get dry. Order has been sent to [facility]. Carron Curie, CMA

## 2012-12-12 ENCOUNTER — Telehealth: Payer: Self-pay | Admitting: Internal Medicine

## 2012-12-12 NOTE — Telephone Encounter (Signed)
Spoke with James Zuniga-- Question concerning which cardiac medication pt was to start and stop  Kalman Shan, MD at 11/30/2012 4:45 PM   Status: Signed            Spoke to Dr Judithe Modest 4:44 PM on 11/30/2012  - dc coreg  - start toprol-XL 50mg  once daily  - have patient followup with Dr Judithe Modest  - ensure patient ful with me per prior OV  Thanks  Dr. Kalman Shan, M.D., [facility].C.P  Pulmonary and Critical Care Medicine  Staff Physician  [facility]  [place] Pulmonary and Critical Care  Pager: [phone], If no answer or between 15:00h - 7:00h: call [phone]  11/30/2012  4:44 PM   Advised James Zuniga of this and nothing further needed at this time

## 2013-01-10 ENCOUNTER — Ambulatory Visit (INDEPENDENT_AMBULATORY_CARE_PROVIDER_SITE_OTHER): Payer: Medicare Other | Admitting: Internal Medicine

## 2013-01-10 ENCOUNTER — Encounter: Payer: Self-pay | Admitting: Internal Medicine

## 2013-01-10 VITALS — BP 122/80 | HR 66 | Temp 98.5°F | Ht 66.0 in | Wt 209.6 lb

## 2013-01-10 DIAGNOSIS — J449 Chronic obstructive pulmonary disease, unspecified: Secondary | ICD-10-CM

## 2013-01-10 DIAGNOSIS — J438 Other emphysema: Secondary | ICD-10-CM

## 2013-01-10 NOTE — Assessment & Plan Note (Signed)
Stable copd but has dyspnea that is affecting quality of life. Subjectively feels o2 helps when he exerts but no eviddence of desaturation with exertion. Will change advair to breo and send him to rehab and see if this alleviates dyspnea  PLAN #COPD - Currently stable disease  - Please continue taking albuterol as needed - continue daily Spiriva  - To help improve shortness of breath  further  - Change advair to BREO 1 puff daily; learn technique  - have referred you to pulmonary rehabilitation at [facility]  - Use oxygen continuous at night while sleeping ; at this time you do not need oxygen with exertion -  We will also make sure you have refills of all meds - Please get breathing test called full pulmonary function test as soon as possible -  #Followup -1 months with NP Tammy for med calendar and to see how BREO is doing for you -Need FULL PFT AT time of followup with Tammy -COPD cat score at followup

## 2013-01-10 NOTE — Progress Notes (Signed)
Subjective:    Patient ID: James Zuniga, male    DOB: [birth year], 57 y.o.   MRN: [id number]  HPI PCP is BADGER,MICHAEL C, MD Body mass index is 33.60 kg/(m^2).  reports that he quit smoking about 5 weeks ago. His smoking use included Cigarettes. He has a 35 pack-year smoking history. He has never used smokeless tobacco.    IOV 08/11/2012  57 year old states he has based "copd and emphysema" on spiriva/advair x 6 year but "now breathing gets worse". Reports dyspnea for "always" in time. Insidious onset. Dyspnea getting worse 3-4 months. Reports dyspnea for 1-2 flight of stairs, dyspnea bed to bathroom and back. Quit smoking 5 weeks ago but dyspnea still getting worse. Rates dyspnea as moderate to severe. Dyspnea is associated with chest pain especially in upper central sternal area. Dyspnea only occ associated with cough or wheezing but never any sputum. There is associated NEW occ. Paroxysmal nocturnal dyspnea x 4 months but unchanged orthopnea (3 pillows) x 6 years . THere is no asssociated edema currently or weight loss, fever.    Of note,   A) COPD diagnosed when he lived in [place], [place] - apparently was seeing pulmonary there. REcollects PFT but does not severity. On spiriva and advair x 6  Years.   B) CHF diagnosis - made 6 years ago in [place]. Does not know EF. Going to see a cardiologist here in [place] on 08/25/12 but does not know who he is seeing. He recollects  ? Cardiac cath and ? 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 Since then weight gain from 170# to 202# which is his baseline but at Body mass index is 33.60 kg/(m^2).      CXR  May 2013   - clear lung fields, personally confirmed    OV 10/18/2012 Followup COPD. Presents with his daughter  - He missed a few appointments  because of bad weather. Therefore he also did not have his pulmonary function test. In the interim he did have overnight oxygen study and he is now using oxygen while sleeping and as needed during the daytime. He has oxygen helping his dyspnea but overall he rates his dyspnea and his quality of life is stable without any change. He continues with Spiriva daily Advair. His cardiologist is at [facility] note is that he uses  Carvedilol. Patient is open to the idea of pulmonary rehabilitation if he can get his logistics sorted out. We discussed lung cancer screening; he is open to it but wants to save money first  Past, Family, Social reviewed: no change since last visit  REC  #COPD - Currently stable disease  - Please continue taking albuterol as needed, daily Spiriva and daily Advair - Use oxygen as needed and the daytime and continuous at night while sleeping - You will get samples of your inhalers today - We will also make sure you have refills - Please get breathing test called full pulmonary function test as soon as possible - I have referred you to pulmonary rehabilitation; it will take 2 months to start  #Congestive heart failure - Please give me the name of  cardiologist in [place].- Plese call your cardiologist and have him change your carvedilol to another drug because  carvedilol can make COPD worse  #Lung cancer screening - We discussed this and you have agree to save $300 to have the CT scan of the chest  #Followup - 3 months or sooner if needed -COPD cat score at followupEC   OV 01/10/2013  FU COPD  - overall stable disease. CAT score is 26 and unchanged. However, describes that symptom burden at baseline cat score of 26 is not accewptable. Gets dyspneic doiung stairs. Relieved by o2 and rest. Wants to feel better. Changing coreg to beta 1 speicific blocker has not made difference. Has o2 for night at home but wants o2 with exertion. He has not been to rehab or done pft  despite doing these orders last visits. Says he is compliant with spiriva and advair. He is concerned about long term prognosis. Walk test at his slow pace with cane 185 feet x 3 laps: did NOT Desaturate  Smoking  - still iun remission  Cardiac  - no new problems  Lung Cancer screening  - holding off for now due to cost   CAT COPD Symptom & Quality of Life Score (GSK trademark) 0 is no burden. 5 is highest burden 08/11/2012  01/10/2013   Never Cough -> Cough all the time 3 2  No phlegm in chest -> Chest is full of phlegm 0 2  No chest tightness -> Chest feels very tight 2 3  No dyspnea for 1 flight stairs/Pintor -> Very dyspneic for 1 flight of stairs 5 5  No limitations for ADL at home -> Very limited with ADL at home 4 4  Confident leaving home -> Not at all confident leaving home 3 4  Sleep soundly -> Do not sleep soundly because of lung condition 4 3  Lots of Energy -> No energy at all 4 3  TOTAL Score (max 40)  26 26    Past, Family, Social reviewed: no change since last visit      Review of Systems  Constitutional: Negative for fever and unexpected weight change.  HENT: Positive for congestion. Negative for ear pain, nosebleeds, sore throat, rhinorrhea, sneezing, trouble swallowing, dental problem, postnasal drip and sinus pressure.   Eyes: Negative for redness and itching.  Respiratory: Positive for cough, chest tightness, shortness of breath and wheezing.   Cardiovascular: Negative for palpitations and leg swelling.  Gastrointestinal: Negative for nausea and vomiting.  Genitourinary: Negative for dysuria.  Musculoskeletal: Negative for joint swelling.  Skin: Negative for rash.  Neurological: Positive for dizziness. Negative for headaches.  Hematological: Does not bruise/bleed easily.  Psychiatric/Behavioral: Negative for dysphoric mood. The patient is not nervous/anxious.    Current outpatient prescriptions:albuterol (PROVENTIL HFA;VENTOLIN HFA) 108 (90 BASE) MCG/ACT  inhaler, Inhale 2 puffs into the lungs every 6 (six) hours as needed. For shortness of breath, Disp: , Rfl: ;  allopurinol (ZYLOPRIM) 300 MG tablet, Take 300 mg by mouth daily., Disp: , Rfl: ;  amLODipine (NORVASC) 5 MG tablet, Take 5 mg by mouth daily., Disp: , Rfl: ;  ANDROGEL PUMP 12.5 MG/ACT (1%) GEL, , Disp: , Rfl:  Fluticasone-Salmeterol (ADVAIR) 250-50 MCG/DOSE AEPB, Inhale 1 puff into the lungs every 12 (twelve) hours., Disp: , Rfl: ;  gabapentin (NEURONTIN) 300 MG capsule, Take 300 mg by mouth 3 (three) times daily. , Disp: , Rfl: ;  ibuprofen (ADVIL,MOTRIN) 600 MG tablet, Take 600 mg by mouth 2 (two) times daily., Disp: , Rfl: ;  metFORMIN (GLUCOPHAGE) 500 MG tablet, Take 500 mg by mouth 2 (two) times  daily with a meal., Disp: , Rfl:  metoprolol succinate (TOPROL XL) 50 MG 24 hr tablet, Take 1 tablet (50 mg total) by mouth daily. Take with or immediately following a meal., Disp: 30 tablet, Rfl: 1;  olmesartan (BENICAR) 20 MG tablet, Take 20 mg by mouth daily., Disp: , Rfl: ;  omeprazole (PRILOSEC) 20 MG capsule, Take 20 mg by mouth every morning., Disp: , Rfl: ;  potassium chloride (K-DUR,KLOR-CON) 10 MEQ tablet, Take 10 mEq by mouth daily. , Disp: , Rfl:  sildenafil (VIAGRA) 100 MG tablet, Take 100 mg by mouth daily as needed. For E.D., Disp: , Rfl: ;  simvastatin (ZOCOR) 20 MG tablet, Take 1 tablet by mouth Daily., Disp: , Rfl: ;  tiotropium (SPIRIVA HANDIHALER) 18 MCG inhalation capsule, Place 1 capsule (18 mcg total) into inhaler and inhale daily., Disp: 30 capsule, Rfl: 6;  tiZANidine (ZANAFLEX) 2 MG tablet, Take 1 tablet by mouth as needed., Disp: , Rfl:  traMADol (ULTRAM-ER) 100 MG 24 hr tablet, TAKE 1 TABLET BY MOUTH DAILY, Disp: 30 tablet, Rfl: 2;  varenicline (CHANTIX PAK) 0.5 MG X 11 & 1 MG X 42 tablet, Take by mouth 2 (two) times daily. Take one 0.5 mg tablet by mouth once daily for 3 days, then increase to one 0.5 mg tablet twice daily for 4 days, then increase to one 1 mg tablet twice  daily., Disp: , Rfl: ;  VOLTAREN 1 % GEL, APPLY 4 GRAMS TOPICALLY FOUR TIMES DAILY, Disp: 500 g, Rfl: 0 zolpidem (AMBIEN) 5 MG tablet, Take 5 mg by mouth at bedtime., Disp: , Rfl:      Objective:   Physical Exam  Nursing note and vitals reviewed. Constitutional: He is oriented to person, place, and time. He appears well-developed and well-nourished. No distress.  HENT:  Head: Normocephalic and atraumatic.  Right Ear: External ear normal.  Left Ear: External ear normal.  Mouth/Throat: Oropharynx is clear and moist. No oropharyngeal exudate.  Eyes: Conjunctivae normal and EOM are normal. Pupils are equal, round, and reactive to light. Right eye exhibits no discharge. Left eye exhibits no discharge. No scleral icterus.  Neck: Normal range of motion. Neck supple. No JVD present. No tracheal deviation present. No thyromegaly present.  Cardiovascular: Normal rate, regular rhythm and intact distal pulses.  Exam reveals no gallop and no friction rub.   No murmur heard. Pulmonary/Chest: Effort normal and breath sounds normal. No respiratory distress. He has no wheezes. He has no rales. He exhibits no tenderness.  Abdominal: Soft. Bowel sounds are normal. He exhibits no distension and no mass. There is no tenderness. There is no rebound and no guarding.  Musculoskeletal: Normal range of motion. He exhibits no edema and no tenderness.       Walks with cane  Lymphadenopathy:    He has no cervical adenopathy.  Neurological: He is alert and oriented to person, place, and time. He has normal reflexes. No cranial nerve deficit. Coordination normal.  Skin: Skin is warm and dry. No rash noted. He is not diaphoretic. No erythema. No pallor.  Psychiatric: He has a normal mood and affect. His behavior is normal. Judgment and thought content normal.          Assessment & Plan:

## 2013-01-10 NOTE — Patient Instructions (Addendum)
#  COPD - Currently stable disease  - Please continue taking albuterol as needed - continue daily Spiriva  - To help improve shortness of breath  further  - Change advair to BREO 1 puff daily; learn technique  - have referred you to pulmonary rehabilitation at [facility]  - Use oxygen continuous at night while sleeping ; at this time you do not need oxygen with exertion -  We will also make sure you have refills of all meds - Please get breathing test called full pulmonary function test as soon as possible -  #Followup -1 months with NP Tammy for med calendar and to see how BREO is doing for you -Need FULL PFT AT time of followup with Tammy -COPD cat score at followup

## 2013-01-20 ENCOUNTER — Telehealth: Payer: Self-pay | Admitting: Internal Medicine

## 2013-01-20 NOTE — Telephone Encounter (Signed)
At last ov I wanted him to fu with TP within a month for med calendar and PFT review. I noted that his PFTs are not done yet and no fu with TP. Please ensure both  Dr. Kalman Shan, M.D., [facility].C.P Pulmonary and Critical Care Medicine Staff Physician [facility] Pulmonary and Critical Care Pager: [phone], If no answer or between  15:00h - 7:00h: call [phone]  01/20/2013 5:17 AM

## 2013-01-25 NOTE — Telephone Encounter (Signed)
LMTCBx1 with family member. Carron Curie, CMA

## 2013-01-25 NOTE — Telephone Encounter (Signed)
Pt returned call.  Try [phone] if home # is not answered. Leanora Ivanoff

## 2013-01-25 NOTE — Telephone Encounter (Signed)
I spoke with the pt and he was given a card with a date and time on it but this was not in the computer so I had to r/s him. PFT is set for this Friday at 9am and med calender is set for 02/23/14 at 9am. Carron Curie, CMA

## 2013-01-27 ENCOUNTER — Ambulatory Visit (INDEPENDENT_AMBULATORY_CARE_PROVIDER_SITE_OTHER): Payer: Medicare Other | Admitting: Internal Medicine

## 2013-01-27 DIAGNOSIS — J449 Chronic obstructive pulmonary disease, unspecified: Secondary | ICD-10-CM

## 2013-01-27 LAB — PULMONARY FUNCTION TEST

## 2013-01-27 NOTE — Progress Notes (Signed)
PFT done today. 

## 2013-02-02 ENCOUNTER — Other Ambulatory Visit: Payer: Self-pay | Admitting: Internal Medicine

## 2013-02-09 ENCOUNTER — Telehealth: Payer: Self-pay | Admitting: Internal Medicine

## 2013-02-09 NOTE — Telephone Encounter (Signed)
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 . DDX is ILD typically  FVC 3L/80%, fev1 2.5L/84%, rtaio 83, TLC 61%, DLCO 18/60%  Might need CT chest HRCT protocol   Dr. Kalman Shan, M.D., [facility].C.P Pulmonary and Critical Care Medicine Staff Physician [facility] Pulmonary and Critical Care Pager: [phone], If no answer or between  15:00h - 7:00h: call [phone]  02/09/2013 6:14 PM

## 2013-02-10 ENCOUNTER — Telehealth: Payer: Self-pay | Admitting: Internal Medicine

## 2013-02-10 MED ORDER — ALBUTEROL SULFATE HFA 108 (90 BASE) MCG/ACT IN AERS: 2.0000 | INHALATION_SPRAY | Freq: Four times a day (QID) | RESPIRATORY_TRACT | Status: DC | PRN

## 2013-02-10 MED ORDER — FLUTICASONE FUROATE-VILANTEROL 100-25 MCG/INH IN AEPB: 1.0000 | INHALATION_SPRAY | Freq: Every day | RESPIRATORY_TRACT | Status: DC

## 2013-02-10 NOTE — Telephone Encounter (Signed)
Ok will discuss with pt Thanks for follow up

## 2013-02-10 NOTE — Telephone Encounter (Signed)
LMTCB x1 for pt RX's have been sent

## 2013-02-13 NOTE — Telephone Encounter (Signed)
Called, spoke with pt -  Informed him rxs sent to pharm.  He verbalized understanding and picked these up yesterday.

## 2013-02-21 ENCOUNTER — Encounter: Payer: Self-pay | Admitting: Internal Medicine

## 2013-02-23 ENCOUNTER — Encounter: Payer: Self-pay | Admitting: Adult Health

## 2013-02-23 ENCOUNTER — Ambulatory Visit (INDEPENDENT_AMBULATORY_CARE_PROVIDER_SITE_OTHER): Payer: Medicare Other | Admitting: Adult Health

## 2013-02-23 VITALS — BP 126/84 | HR 77 | Temp 97.5°F | Ht 66.0 in | Wt 205.8 lb

## 2013-02-23 DIAGNOSIS — J449 Chronic obstructive pulmonary disease, unspecified: Secondary | ICD-10-CM

## 2013-02-23 MED ORDER — BUPROPION HCL ER (XL) 150 MG PO TB24: 150.0000 mg | ORAL_TABLET | Freq: Every day | ORAL | Status: DC

## 2013-02-23 NOTE — Assessment & Plan Note (Addendum)
No significant airflow obstruction on PFT  Restriction changes with decreased TLC and decreased DLCO  Will set up for CT chest -HR to look for possible ILD  Will cont current reigmen , consider stopping BREO on return if able to quit smoking   Plan   Most important goal to quit smoking.  May try Wellbutrin 150 mg daily to help with smoking cessation.  Continue on Breo 1 puff daily  Continue on Spiriva 1 puff daily  follow up Dr. Marchelle Gearing in 2-3 months and As needed   Set up CT chest-HRCT  To assess for decreased DLCO .

## 2013-02-23 NOTE — Progress Notes (Signed)
Subjective:    Patient ID: James Zuniga, male    DOB: [birth year], 57 y.o.   MRN: [id number]  HPI PCP is BADGER,MICHAEL C, MD Body mass index is 33.60 kg/(m^2).  reports that he quit smoking about 5 weeks ago. His smoking use included Cigarettes. He has a 35 pack-year smoking history. He has never used smokeless tobacco.    IOV 08/11/2012  57 year old states he has based "copd and emphysema" on spiriva/advair x 6 year but "now breathing gets worse". Reports dyspnea for "always" in time. Insidious onset. Dyspnea getting worse 3-4 months. Reports dyspnea for 1-2 flight of stairs, dyspnea bed to bathroom and back. Quit smoking 5 weeks ago but dyspnea still getting worse. Rates dyspnea as moderate to severe. Dyspnea is associated with chest pain especially in upper central sternal area. Dyspnea only occ associated with cough or wheezing but never any sputum. There is associated NEW occ. Paroxysmal nocturnal dyspnea x 4 months but unchanged orthopnea (3 pillows) x 6 years . THere is no asssociated edema currently or weight loss, fever.    Of note,   A) COPD diagnosed when he lived in [place], [place] - apparently was seeing pulmonary there. REcollects PFT but does not severity. On spiriva and advair x 6  Years.   B) CHF diagnosis - made 6 years ago in [place]. Does not know EF. Going to see a cardiologist here in [place] on 08/25/12 but does not know who he is seeing. He recollects  ? Cardiac cath and ? 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 Since then weight gain from 170# to 202# which is his baseline but at Body mass index is 33.60 kg/(m^2).      CXR  May 2013   - clear lung fields, personally confirmed    OV 10/18/2012 Followup COPD. Presents with his daughter  - He missed a few appointments  because of bad weather. Therefore he also did not have his pulmonary function test. In the interim he did have overnight oxygen study and he is now using oxygen while sleeping and as needed during the daytime. He has oxygen helping his dyspnea but overall he rates his dyspnea and his quality of life is stable without any change. He continues with Spiriva daily Advair. His cardiologist is at [facility] note is that he uses  Carvedilol. Patient is open to the idea of pulmonary rehabilitation if he can get his logistics sorted out. We discussed lung cancer screening; he is open to it but wants to save money first  Past, Family, Social reviewed: no change since last visit  REC  #COPD - Currently stable disease  - Please continue taking albuterol as needed, daily Spiriva and daily Advair - Use oxygen as needed and the daytime and continuous at night while sleeping - You will get samples of your inhalers today - We will also make sure you have refills - Please get breathing test called full pulmonary function test as soon as possible - I have referred you to pulmonary rehabilitation; it will take 2 months to start  #Congestive heart failure - Please give me the name of  cardiologist in [place].- Plese call your cardiologist and have him change your carvedilol to another drug because  carvedilol can make COPD worse  #Lung cancer screening - We discussed this and you have agree to save $300 to have the CT scan of the chest  #Followup - 3 months or sooner if needed -COPD cat score at followupEC   OV 01/10/2013  FU COPD  - overall stable disease. CAT score is 26 and unchanged. However, describes that symptom burden at baseline cat score of 26 is not accewptable. Gets dyspneic doiung stairs. Relieved by o2 and rest. Wants to feel better. Changing coreg to beta 1 speicific blocker has not made difference. Has o2 for night at home but wants o2 with exertion. He has not been to rehab or done pft  despite doing these orders last visits. Says he is compliant with spiriva and advair. He is concerned about long term prognosis. Walk test at his slow pace with cane 185 feet x 3 laps: did NOT Desaturate  Smoking  - still iun remission  Cardiac  - no new problems  Lung Cancer screening  - holding off for now due to cost   CAT COPD Symptom & Quality of Life Score (GSK trademark) 0 is no burden. 5 is highest burden 08/11/2012  01/10/2013   Never Cough -> Cough all the time 3 2  No phlegm in chest -> Chest is full of phlegm 0 2  No chest tightness -> Chest feels very tight 2 3  No dyspnea for 1 flight stairs/[place] -> Very dyspneic for 1 flight of stairs 5 5  No limitations for ADL at home -> Very limited with ADL at home 4 4  Confident leaving home -> Not at all confident leaving home 3 4  Sleep soundly -> Do not sleep soundly because of lung condition 4 3  Lots of Energy -> No energy at all 4 3  TOTAL Score (max 40)  26 26    02/23/2013 Follow Up  1 month follow up - reports breathing is "about the same" but believes the Virgel Bouquet has helped.  would like to review PFT results.  smoking again 3-4cigs per day d/t side effects of the chantix; would like to discuss other therapies.  CAT = 28 Would like rx for wellbutrin . Can not afford nicotine patches.  PFT showed no airflow obstruction with FEV 1 at 84%, ratio 83  DLCO decreased at 60%. , no sign change s/p BD.  TLC 61% We discussed smoking cessation  Patient denies any hemoptysis, orthopnea, PND, or leg swelling .     Past, Family, Social reviewed: no change since last visit      Review of Systems  Constitutional: Negative for fever and unexpected weight change.  HENT: Positive for congestion. Negative for ear pain, nosebleeds, sore throat, rhinorrhea, sneezing, trouble swallowing, dental problem, postnasal drip and sinus pressure.   Eyes: Negative for redness and itching.  Respiratory: Positive for cough, chest tightness,  shortness of breath and wheezing.   Cardiovascular: Negative for palpitations and leg swelling.  Gastrointestinal: Negative for nausea and vomiting.  Genitourinary: Negative for dysuria.  Musculoskeletal: Negative for joint swelling.  Skin: Negative for rash.  Neurological: Positive for dizziness. Negative for headaches.  Hematological: Does not bruise/bleed easily.  Psychiatric/Behavioral: Negative for dysphoric mood. The patient is not nervous/anxious.    Current outpa     Objective:   Physical Exam  Nursing note and vitals reviewed. Constitutional: He is oriented to person, place, and time. He appears well-developed and well-nourished. No distress.  HENT:  Head: Normocephalic and atraumatic.  Right Ear: External ear normal.  Left Ear: External ear normal.  Mouth/Throat: Oropharynx is clear and moist. No oropharyngeal exudate.  Eyes: Conjunctivae normal and EOM are normal. Pupils are equal, round, and reactive to light. Right eye exhibits no discharge. Left eye exhibits no discharge. No scleral icterus.  Neck: Normal range of motion. Neck supple. No JVD present. No tracheal deviation present. No thyromegaly present.  Cardiovascular: Normal rate, regular rhythm and intact distal pulses.  Exam reveals no gallop and no friction rub.   No murmur heard. Pulmonary/Chest: Effort normal and breath sounds normal. No respiratory distress. He has no wheezes. He has no rales. He exhibits no tenderness.  Abdominal: Soft. Bowel sounds are normal. He exhibits no distension and no mass. There is no tenderness. There is no rebound and no guarding.  Musculoskeletal: Normal range of motion. He exhibits no edema and no tenderness.       Walks with cane  Lymphadenopathy:    He has no cervical adenopathy.  Neurological: He is alert and oriented to person, place, and time. He has normal reflexes. No cranial nerve deficit. Coordination normal.  Skin: Skin is warm and dry. No rash noted. He is not  diaphoretic. No erythema. No pallor.  Psychiatric: He has a normal mood and affect. His behavior is normal. Judgment and thought content normal.          Assessment & Plan:

## 2013-02-23 NOTE — Patient Instructions (Addendum)
Most important goal to quit smoking.  May try Wellbutrin 150 mg daily to help with smoking cessation.  Continue on Breo 1 puff daily  Continue on Spiriva 1 puff daily  follow up Dr. Marchelle Gearing in 2-3 months and As needed

## 2013-02-24 NOTE — Addendum Note (Signed)
Addended by: Boone Master E on: 02/24/2013 05:14 PM   Modules accepted: Orders

## 2013-02-24 NOTE — Addendum Note (Signed)
Addended by: Boone Master E on: 02/24/2013 04:15 PM   Modules accepted: Orders

## 2013-03-01 ENCOUNTER — Other Ambulatory Visit (INDEPENDENT_AMBULATORY_CARE_PROVIDER_SITE_OTHER): Payer: Medicare Other

## 2013-03-01 DIAGNOSIS — J449 Chronic obstructive pulmonary disease, unspecified: Secondary | ICD-10-CM

## 2013-03-01 LAB — BASIC METABOLIC PANEL
BUN: 11 mg/dL (ref 6–23)
Calcium: 9.1 mg/dL (ref 8.4–10.5)
Chloride: 104 mEq/L (ref 96–112)
Creatinine, Ser: 1.2 mg/dL (ref 0.4–1.5)
GFR: 67.45 mL/min (ref >60.00)

## 2013-03-02 ENCOUNTER — Ambulatory Visit (INDEPENDENT_AMBULATORY_CARE_PROVIDER_SITE_OTHER)
Admission: RE | Admit: 2013-03-02 | Discharge: 2013-03-02 | Disposition: A | Discharge status: Home / Self Care | Payer: Medicare Other | Source: Ambulatory Visit | Attending: Adult Health | Admitting: Adult Health

## 2013-03-02 DIAGNOSIS — E291 Testicular hypofunction: Secondary | ICD-10-CM | POA: Insufficient documentation

## 2013-03-02 DIAGNOSIS — J449 Chronic obstructive pulmonary disease, unspecified: Secondary | ICD-10-CM

## 2013-03-02 DIAGNOSIS — J4489 Other specified chronic obstructive pulmonary disease: Secondary | ICD-10-CM

## 2013-03-02 MED ORDER — IOHEXOL 300 MG/ML  SOLN
80.0000 mL | Freq: Once | INTRAMUSCULAR | Status: AC | PRN
  Administered 2013-03-02: 80 mL via INTRAVENOUS

## 2013-03-03 ENCOUNTER — Telehealth: Payer: Self-pay | Admitting: Internal Medicine

## 2013-03-03 ENCOUNTER — Encounter: Payer: Self-pay | Admitting: Adult Health

## 2013-03-03 NOTE — Telephone Encounter (Signed)
Results have been explained to patient, pt expressed understanding. Appt scheduled with MR 03/23/13 10:45 to discuss further Pt aware to make appt with PCP to f/u on CT--liver abnormalities.

## 2013-03-09 NOTE — Progress Notes (Signed)
Quick Note:  Pt aware per 8.22.14 phone note. ______ 

## 2013-03-09 NOTE — Progress Notes (Signed)
Quick Note:  Pt aware per 8.22.14 phone note. ______

## 2013-03-15 DIAGNOSIS — K769 Liver disease, unspecified: Secondary | ICD-10-CM | POA: Insufficient documentation

## 2013-03-21 DIAGNOSIS — R9389 Abnormal findings on diagnostic imaging of other specified body structures: Secondary | ICD-10-CM | POA: Insufficient documentation

## 2013-03-23 ENCOUNTER — Telehealth: Payer: Self-pay | Admitting: Internal Medicine

## 2013-03-23 ENCOUNTER — Ambulatory Visit (INDEPENDENT_AMBULATORY_CARE_PROVIDER_SITE_OTHER): Payer: Medicare Other | Admitting: Internal Medicine

## 2013-03-23 ENCOUNTER — Encounter: Payer: Self-pay | Admitting: Internal Medicine

## 2013-03-23 VITALS — BP 120/90 | HR 68 | Ht 66.0 in | Wt 199.8 lb

## 2013-03-23 DIAGNOSIS — R911 Solitary pulmonary nodule: Secondary | ICD-10-CM

## 2013-03-23 DIAGNOSIS — K769 Liver disease, unspecified: Secondary | ICD-10-CM

## 2013-03-23 DIAGNOSIS — J449 Chronic obstructive pulmonary disease, unspecified: Secondary | ICD-10-CM

## 2013-03-23 DIAGNOSIS — J4489 Other specified chronic obstructive pulmonary disease: Secondary | ICD-10-CM

## 2013-03-23 DIAGNOSIS — K7689 Other specified diseases of liver: Secondary | ICD-10-CM

## 2013-03-23 NOTE — Telephone Encounter (Signed)
I LM that MR wants to speak with Zoe Lan, NP about this pt. I left MR cell number on the voicemail. I advised any further questions to call back. I will leave message open in case she calls back. Carron Curie, CMA

## 2013-03-23 NOTE — Progress Notes (Signed)
Subjective:    Patient ID: James Zuniga, male    DOB: August 13, 1955, 57 y.o.   MRN: [id number]  HPI PCP is BADGER,MICHAEL C, MD Body mass index is 33.60 kg/(m^2).  reports that he quit smoking about 5 weeks ago. His smoking use included Cigarettes. He has a 35 pack-year smoking history. He has never used smokeless tobacco.    IOV 08/11/2012  57 year old states he has based "copd and emphysema" on spiriva/advair x 6 year but "now breathing gets worse". Reports dyspnea for "always" in time. Insidious onset. Dyspnea getting worse 3-4 months. Reports dyspnea for 1-2 flight of stairs, dyspnea bed to bathroom and back. Quit smoking 5 weeks ago but dyspnea still getting worse. Rates dyspnea as moderate to severe. Dyspnea is associated with chest pain especially in upper central sternal area. Dyspnea only occ associated with cough or wheezing but never any sputum. There is associated NEW occ. Paroxysmal nocturnal dyspnea x 4 months but unchanged orthopnea (3 pillows) x 6 years . THere is no asssociated edema currently or weight loss, fever.    Of note,   A) COPD diagnosed when he lived in [place], [place] - apparently was seeing pulmonary there. REcollects PFT but does not severity. On spiriva and advair x 6  Years.   B) CHF diagnosis - made 6 years ago in [place]. Does not know EF. Going to see a cardiologist here in [place] on 08/25/12 but does not know who he is seeing. He recollects  ? Cardiac cath and ? 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 Since then weight gain from 170# to 202# which is his baseline but at Body mass index is 33.60 kg/(m^2).      CXR  May 2013   - clear lung fields, personally confirmed    OV 10/18/2012 Followup COPD. Presents with his daughter  - He missed a few appointments  because of bad weather. Therefore he also did not have his pulmonary function test. In the interim he did have overnight oxygen study and he is now using oxygen while sleeping and as needed during the daytime. He has oxygen helping his dyspnea but overall he rates his dyspnea and his quality of life is stable without any change. He continues with Spiriva daily Advair. His cardiologist is at [facility] note is that he uses  Carvedilol. Patient is open to the idea of pulmonary rehabilitation if he can get his logistics sorted out. We discussed lung cancer screening; he is open to it but wants to save money first  Past, Family, Social reviewed: no change since last visit  REC  #COPD - Currently stable disease  - Please continue taking albuterol as needed, daily Spiriva and daily Advair - Use oxygen as needed and the daytime and continuous at night while sleeping - You will get samples of your inhalers today - We will also make sure you have refills - Please get breathing test called full pulmonary function test as soon as possible - I have referred you to pulmonary rehabilitation; it will take 2 months to start  #Congestive heart failure - Please give me the name of  cardiologist in [place].- Plese call your cardiologist and have him change your carvedilol to another drug because  carvedilol can make COPD worse  #Lung cancer screening - We discussed this and you have agree to save $300 to have the CT scan of the chest  #Followup - 3 months or sooner if needed -COPD cat score at followupEC   OV 01/10/2013  FU COPD  - overall stable disease. CAT score is 26 and unchanged. However, describes that symptom burden at baseline cat score of 26 is not accewptable. Gets dyspneic doiung stairs. Relieved by o2 and rest. Wants to feel better. Changing coreg to beta 1 speicific blocker has not made difference. Has o2 for night at home but wants o2 with exertion. He has not been to rehab or done pft  despite doing these orders last visits. Says he is compliant with spiriva and advair. He is concerned about long term prognosis. Walk test at his slow pace with cane 185 feet x 3 laps: did NOT Desaturate  Smoking  - still iun remission  Cardiac  - no new problems  Lung Cancer screening  - holding off for now due to cost     Past, Family, Social reviewed: no change since last visit  #COPD  - Currently stable disease  - Please continue taking albuterol as needed  - continue daily Spiriva  - To help improve shortness of breath further  - Change advair to BREO 1 puff daily; learn technique  - have referred you to pulmonary rehabilitation at [facility]  - Use oxygen continuous at night while sleeping ; at this time you do not need oxygen with exertion  - We will also make sure you have refills of all meds  - Please get breathing test called full pulmonary function test as soon as possible  -  #Followup  -1 months with NP Tammy for med calendar and to see how BREO is doing for you  -Need FULL PFT AT time of followup with Tammy  -COPD cat score at followup  02/23/2013 Follow Up  1 month follow up - reports breathing is "about the same" but believes the Virgel Bouquet has helped.  would like to review PFT results.  smoking again 3-4cigs per day d/t side effects of the chantix; would like to discuss other therapies.  CAT = 28 Would like rx for wellbutrin . Can not afford nicotine patches.  PFT showed no airflow obstruction with FEV 1 at 84%, ratio 83  DLCO decreased at 60%. , no sign change s/p BD.  TLC 61% We discussed smoking cessation  Patient denies any hemoptysis, orthopnea, PND, or leg swelling .   Past, Family, Social reviewed: no change since last visit  REC Most important goal to quit smoking.  May try Wellbutrin 150 mg daily to help with smoking cessation.  Continue on Breo 1 puff daily  Continue on Spiriva 1 puff daily  follow up Dr. Marchelle Gearing in 2-3 months and As  needed     OV 03/23/2013   Followup emphysema. He is here with his fiance who I met for the firest time. Overall dyspnea is slightly worse. He is been diagnosed with anemia. He says his cardiac status is stable. It looks like his COPD is also stable but his dyspnea is worse and ? Due to anemia/ddeconditioning.Marland Kitchen He cannot undergo pulmonary rehabilitation because of back issues.   Apparently the anemia is new andfound out duringa followup to his liver lesion seen on CT scan of the chest below and there is concern on the new MRI [which I do not have results]  that he might have  liver cancer and has been referred to an oncologist. Npote: patient and fiancee upset at me for not knowing medical detail about liver and are asking me to explain liver pathology. [place] PMD [place] at [place] and the MRI etc., not presentin our system. I spoke to her CMA  the following day and the PMD has already explained DDx and plan of action. They will call patient again and explain   He recently had CT chest r restrictive PFTs and the results is as below    IMPRESSION: CT chest 03/02/13 1. Mild paraseptal emphysema appears upper lobe predominant.  2. Sub solid nodule within the right upper lobe measures 4 mm.  This is most likely benign and no further follow-up is necessary.  3. There are two indeterminant structures within the right hepatic  lobe. One is a hyperattenuating. This may represent a flash fill  hemangioma, vascular phenomenon or benign or malignant  hypervascular tumor. The other lesion is intermediate in  attenuation. Further evaluation with contrast enhanced MRI would  be the study of choice to more definitively assess these  structures.  Original Report Authenticated By: Signa Kell, M.D.          Result Notes     CAT COPD Symptom & Quality of Life Score (GSK trademark) 0 is no burden. 5 is highest burden 08/11/2012  01/10/2013  03/23/2013   Never Cough -> Cough all the time 3 2 3   No  phlegm in chest -> Chest is full of phlegm 0 2 3  No chest tightness -> Chest feels very tight 2 3 3   No dyspnea for 1 flight stairs/[place] -> Very dyspneic for 1 flight of stairs 5 5 5   No limitations for ADL at home -> Very limited with ADL at home 4 4 5   Confident leaving home -> Not at all confident leaving home 3 4 2   Sleep soundly -> Do not sleep soundly because of lung condition 4 3 3   Lots of Energy -> No energy at all 4 3 5   TOTAL Score (max 40)  26 26 29      Review of Systems  Constitutional: Negative for fever and unexpected weight change.  HENT: Negative for ear pain, nosebleeds, congestion, sore throat, rhinorrhea, sneezing, trouble swallowing, dental problem, postnasal drip and sinus pressure.   Eyes: Negative for redness and itching.  Respiratory: Negative for cough, chest tightness, shortness of breath and wheezing.   Cardiovascular: Negative for palpitations and leg swelling.  Gastrointestinal: Negative for nausea and vomiting.  Genitourinary: Negative for dysuria.  Musculoskeletal: Negative for joint swelling.  Skin: Negative for rash.  Neurological: Negative for headaches.  Hematological: Does not bruise/bleed easily.  Psychiatric/Behavioral: Negative for dysphoric mood. The patient is not nervous/anxious.        Objective:   Physical Exam Nursing note and vitals reviewed. Constitutional: He is oriented to person, place, and time. He appears well-developed and well-nourished. No distress.  HENT:  Head: Normocephalic and atraumatic.  Right Ear: External ear normal.  Left Ear: External ear normal.  Mouth/Throat: Oropharynx is clear and moist. No oropharyngeal exudate.  Eyes: Conjunctivae normal and EOM are normal. Pupils are equal, round, and reactive to light. Right eye exhibits no discharge. Left eye exhibits no discharge. No scleral icterus.  Neck: Normal range of motion. Neck supple. No JVD present. No tracheal deviation present. No thyromegaly present.   Cardiovascular: Normal rate, regular rhythm and intact distal pulses.  Exam reveals  no gallop and no friction rub.   No murmur heard. Pulmonary/Chest: Effort normal and breath sounds normal. No respiratory distress. He has no wheezes. He has no rales. He exhibits no tenderness.  Abdominal: Soft. Bowel sounds are normal. He exhibits no distension and no mass. There is no tenderness. There is no rebound and no guarding.  Musculoskeletal: Normal range of motion. He exhibits no edema and no tenderness.       Walks with cane  Lymphadenopathy:    He has no cervical adenopathy.  Neurological: He is alert and oriented to person, place, and time. He has normal reflexes. No cranial nerve deficit. Coordination normal.  Skin: Skin is warm and dry. No rash noted. He is not diaphoretic. No erythema. No pallor.  Psychiatric: He has a normal mood and affect. His behavior is normal. Judgment and thought content normal.         Assessment & Plan:

## 2013-03-23 NOTE — Patient Instructions (Addendum)
#  EMphysema and shoirtness of breath - continue Breo - change spiriva to tudorza 1 puff twice daily (take sample) - if above does not help shortness of breath; then  Will be very tough bcause of anemia, heart and back issues  #Liver lesion  - Will try to talk to your PCP Zoe Lan, NP to figure out what is going on; have them call you again   #Lung nodule 4mm RUL on CT August 2014 - need repeat CT chest Aug 2015  #Followup 3 months or sooner

## 2013-03-25 DIAGNOSIS — R911 Solitary pulmonary nodule: Secondary | ICD-10-CM | POA: Insufficient documentation

## 2013-03-25 DIAGNOSIS — K769 Liver disease, unspecified: Secondary | ICD-10-CM | POA: Insufficient documentation

## 2013-03-25 NOTE — Assessment & Plan Note (Signed)
#  EMphysema and shoirtness of breath - continue Breo - change spiriva to tudorza 1 puff twice daily (take sample) - if above does not help shortness of breath; then  Will be very tough bcause of anemia, heart and back issues #Followup 3 months or sooner

## 2013-03-25 NOTE — Assessment & Plan Note (Signed)
Undergoing workup for this at [facility]. Told CMA of PCP that they should handle communciatoons on this

## 2013-03-25 NOTE — Assessment & Plan Note (Signed)
#  Lung nodule 4mm RUL on CT August 2014 - need repeat CT chest Aug 2015

## 2013-03-27 ENCOUNTER — Telehealth (HOSPITAL_COMMUNITY): Payer: Self-pay | Admitting: Cardiac Rehabilitation

## 2013-03-27 NOTE — Telephone Encounter (Signed)
MR spoke with doctor. Carron Curie, CMA

## 2013-03-27 NOTE — Telephone Encounter (Signed)
pc to pt to enroll in pulmonary rehab program.  No answer, voice mail not set up.  Letter mailed to patient.

## 2013-03-29 ENCOUNTER — Telehealth (HOSPITAL_COMMUNITY): Payer: Self-pay | Admitting: *Deleted

## 2013-03-29 NOTE — Telephone Encounter (Signed)
Call placed to patient regarding Pulmonary Rehab.  He is declining at this time due to chronic back pain and upcoming test for liver problem.  Patient reassured we will be happy to see him at a later date if he wishes to try rehab.  Cathie Olden RN

## 2013-04-03 ENCOUNTER — Telehealth: Payer: Self-pay | Admitting: Pulmonary Disease

## 2013-04-03 MED ORDER — METOPROLOL SUCCINATE ER 50 MG PO TB24: ORAL_TABLET | ORAL | Status: DC

## 2013-04-03 NOTE — Telephone Encounter (Signed)
RX has been sent to [facility]. Nothing further needed

## 2013-04-20 ENCOUNTER — Telehealth: Payer: Self-pay | Admitting: Pulmonary Disease

## 2013-04-20 NOTE — Telephone Encounter (Signed)
Please advise MR thanks 

## 2013-04-20 NOTE — Telephone Encounter (Signed)
Spoke with the pt He was last seen here for acute ov by MR 03/23/13 At that visit, MR rec that he d/c spiriva and start tudorza Pt states that his breathing is much improved and prefers tudorza over spiriva  Dr Craige Cotta, can we call in [place] for him? Please advise, thanks!

## 2013-04-20 NOTE — Telephone Encounter (Signed)
Pt is followed by Dr. Marchelle Gearing.  Would defer to Dr. Marchelle Gearing to address.  Will route message to Dr. Marchelle Gearing and back to Triage.

## 2013-04-21 MED ORDER — ACLIDINIUM BROMIDE 400 MCG/ACT IN AEPB: 1.0000 | INHALATION_SPRAY | Freq: Two times a day (BID) | RESPIRATORY_TRACT | Status: DC

## 2013-04-21 NOTE — Telephone Encounter (Signed)
Pt calling again in ref to previous msg can be reached at [phone].James Zuniga

## 2013-04-21 NOTE — Telephone Encounter (Signed)
Rx was sent to pharm and pt made aware

## 2013-04-21 NOTE — Telephone Encounter (Signed)
Ok for [place]. Migjt need sample for now if costly. Might need prior authorization  Dr. Kalman Shan, M.D., [facility].C.P Pulmonary and Critical Care Medicine Staff Physician [facility]  Pulmonary and Critical Care Pager: [phone], If no answer or between  15:00h - 7:00h: call [phone]  04/21/2013 3:15 PM

## 2013-04-26 ENCOUNTER — Telehealth: Payer: Self-pay | Admitting: Internal Medicine

## 2013-04-26 ENCOUNTER — Telehealth: Payer: Self-pay | Admitting: Pulmonary Disease

## 2013-04-26 NOTE — Telephone Encounter (Signed)
RX was sent 04/21/13 to the pharmacy for pt.  lmomtcb x1 for pt

## 2013-04-26 NOTE — Telephone Encounter (Signed)
Spoke with pharmacy-pt needs a PA for James Zuniga; they are faxing information over. Pt is aware of this as well and aware we will let him know the outcome from his insurance company. Will forward to MR nurse to look out for PA.

## 2013-04-26 NOTE — Telephone Encounter (Signed)
Error.James Zuniga ° °

## 2013-04-26 NOTE — Telephone Encounter (Signed)
Pt's son returned call & states that Rx for tudorza isn't ready at the pharmacy.  Pt states that he believes that a prior auth may be required.  Pt states that this works the best for him.  [name]

## 2013-04-27 MED ORDER — ACLIDINIUM BROMIDE 400 MCG/ACT IN AEPB: 1.0000 | INHALATION_SPRAY | Freq: Two times a day (BID) | RESPIRATORY_TRACT | Status: DC

## 2013-04-27 NOTE — Telephone Encounter (Signed)
I spoke with pt and made him aware will leave sample upfront for pick up. I have also called to initiate PA at [phone]. Patient ID # [id number]. This medication was approved until 07/12/14 case #: [id number] I called and spoke with [facility] and made them aware. I called pt back as well to make him aware. Nothing further needed

## 2013-04-27 NOTE — Telephone Encounter (Signed)
James Zuniga is requesting [place] to call [name] at [phone].  Pt's [name] sample is sitting at "0".  Pt will need something to last while PA is going on.  [name]

## 2013-05-01 ENCOUNTER — Ambulatory Visit: Payer: Medicare Other | Admitting: Pulmonary Disease

## 2013-05-03 ENCOUNTER — Other Ambulatory Visit: Payer: Self-pay | Admitting: Gastroenterology

## 2013-05-03 DIAGNOSIS — R932 Abnormal findings on diagnostic imaging of liver and biliary tract: Secondary | ICD-10-CM

## 2013-05-03 DIAGNOSIS — K769 Liver disease, unspecified: Secondary | ICD-10-CM

## 2013-05-12 ENCOUNTER — Other Ambulatory Visit: Payer: Self-pay | Admitting: *Deleted

## 2013-05-12 MED ORDER — METOPROLOL SUCCINATE ER 50 MG PO TB24: ORAL_TABLET | ORAL | Status: DC

## 2013-05-18 ENCOUNTER — Other Ambulatory Visit: Payer: Self-pay

## 2013-05-25 ENCOUNTER — Ambulatory Visit: Payer: Medicare Other | Admitting: Internal Medicine

## 2013-06-01 DIAGNOSIS — Z720 Tobacco use: Secondary | ICD-10-CM | POA: Insufficient documentation

## 2013-06-01 DIAGNOSIS — F17200 Nicotine dependence, unspecified, uncomplicated: Secondary | ICD-10-CM | POA: Insufficient documentation

## 2013-06-07 DIAGNOSIS — N529 Male erectile dysfunction, unspecified: Secondary | ICD-10-CM | POA: Insufficient documentation

## 2013-06-19 ENCOUNTER — Ambulatory Visit
Admission: RE | Admit: 2013-06-19 | Discharge: 2013-06-19 | Disposition: A | Discharge status: Home / Self Care | Payer: Medicare Other | Source: Ambulatory Visit | Attending: Gastroenterology | Admitting: Gastroenterology

## 2013-06-19 ENCOUNTER — Ambulatory Visit: Payer: Medicare Other | Admitting: Internal Medicine

## 2013-06-19 ENCOUNTER — Encounter: Payer: Self-pay | Admitting: Internal Medicine

## 2013-06-19 ENCOUNTER — Ambulatory Visit (INDEPENDENT_AMBULATORY_CARE_PROVIDER_SITE_OTHER): Payer: Medicare Other | Admitting: Internal Medicine

## 2013-06-19 ENCOUNTER — Encounter (INDEPENDENT_AMBULATORY_CARE_PROVIDER_SITE_OTHER): Payer: Self-pay

## 2013-06-19 VITALS — BP 122/84 | HR 77 | Ht 66.0 in | Wt 199.4 lb

## 2013-06-19 DIAGNOSIS — J449 Chronic obstructive pulmonary disease, unspecified: Secondary | ICD-10-CM

## 2013-06-19 DIAGNOSIS — F17200 Nicotine dependence, unspecified, uncomplicated: Secondary | ICD-10-CM

## 2013-06-19 DIAGNOSIS — R911 Solitary pulmonary nodule: Secondary | ICD-10-CM

## 2013-06-19 DIAGNOSIS — R932 Abnormal findings on diagnostic imaging of liver and biliary tract: Secondary | ICD-10-CM

## 2013-06-19 DIAGNOSIS — K769 Liver disease, unspecified: Secondary | ICD-10-CM

## 2013-06-19 MED ORDER — DOXYCYCLINE HYCLATE 100 MG PO TABS: 100.0000 mg | ORAL_TABLET | Freq: Two times a day (BID) | ORAL | Status: DC

## 2013-06-19 MED ORDER — GADOBENATE DIMEGLUMINE 529 MG/ML IV SOLN
18.0000 mL | Freq: Once | INTRAVENOUS | Status: AC | PRN
  Administered 2013-06-19: 18 mL via INTRAVENOUS

## 2013-06-19 NOTE — Patient Instructions (Addendum)
#  COPD - might be in mild flare up  - Take doxycycline 100mg  po twice daily x 5 days; take after meals and avoid sunlight - continue tudorza and breo  #Lung nodule 4mm Aug 2014  - fu CT ches Aug 2015  #Liver lesion on MRI 06/19/2013  - please follow with Dr Madilyn Fireman  #Snoring  - address at future date  #SMoking  - too bad wellbutrin is causing dreams; recommend you stop it when convenient - chantix not an option due to prior bad experience and dream risk - you can try nicotine patch but understand $ issues  #Followup  - 4 months or sooner if needed

## 2013-06-19 NOTE — Progress Notes (Signed)
Subjective:    Patient ID: James Zuniga, male    DOB: 1956-01-26, 58 y.o.   MRN: [id number]  HPI   HPI PCP is BADGER,MICHAEL C, MD Body mass index is 33.60 kg/(m^2).  reports that he quit smoking about 5 weeks ago. His smoking use included Cigarettes. He has a 35 pack-year smoking history. He has never used smokeless tobacco.    IOV 08/11/2012  57 year old states he has based "copd and emphysema" on spiriva/advair x 6 year but "now breathing gets worse". Reports dyspnea for "always" in time. Insidious onset. Dyspnea getting worse 3-4 months. Reports dyspnea for 1-2 flight of stairs, dyspnea bed to bathroom and back. Quit smoking 5 weeks ago but dyspnea still getting worse. Rates dyspnea as moderate to severe. Dyspnea is associated with chest pain especially in upper central sternal area. Dyspnea only occ associated with cough or wheezing but never any sputum. There is associated NEW occ. Paroxysmal nocturnal dyspnea x 4 months but unchanged orthopnea (3 pillows) x 6 years . THere is no asssociated edema currently or weight loss, fever.    Of note,   A) COPD diagnosed when he lived in [place], [place] - apparently was seeing pulmonary there. REcollects PFT but does not severity. On spiriva and advair x 6  Years.   B) CHF diagnosis - made 6 years ago in [place]. Does not know EF. Going to see a cardiologist here in [place] on 08/25/12 but does not know who he is seeing. He recollects  ? Cardiac cath and ? 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 Since then weight gain from 170# to 202# which is his baseline but at Body mass index is 33.60 kg/(m^2).      CXR  May 2013   - clear lung fields, personally confirmed    OV 10/18/2012 Followup COPD. Presents with his daughter  - He missed a few  appointments because of bad weather. Therefore he also did not have his pulmonary function test. In the interim he did have overnight oxygen study and he is now using oxygen while sleeping and as needed during the daytime. He has oxygen helping his dyspnea but overall he rates his dyspnea and his quality of life is stable without any change. He continues with Spiriva daily Advair. His cardiologist is at [facility] note is that he uses  Carvedilol. Patient is open to the idea of pulmonary rehabilitation if he can get his logistics sorted out. We discussed lung cancer screening; he is open to it but wants to save money first  Past, Family, Social reviewed: no change since last visit  REC  #COPD - Currently stable disease  - Please continue taking albuterol as needed, daily Spiriva and daily Advair - Use oxygen as needed and the daytime and continuous at night while sleeping - You will get samples of your inhalers today - We will also make sure you have refills - Please get breathing test called full pulmonary function test as soon as possible - I have referred you to pulmonary rehabilitation; it will take 2 months to start  #Congestive heart failure - Please give me the name of  cardiologist in [place].- Plese call your cardiologist and have him change your carvedilol  to another drug because carvedilol can make COPD worse  #Lung cancer screening - We discussed this and you have agree to save $300 to have the CT scan of the chest  #Followup - 3 months or sooner if needed -COPD cat score at followupEC   OV 01/10/2013  FU COPD  - overall stable disease. CAT score is 26 and unchanged. However, describes that symptom burden at baseline cat score of 26 is not accewptable. Gets dyspneic doiung stairs. Relieved by o2 and rest. Wants to feel better. Changing coreg to beta 1 speicific blocker has not made difference. Has o2 for night at home but wants o2 with exertion. He has not been to rehab or  done pft despite doing these orders last visits. Says he is compliant with spiriva and advair. He is concerned about long term prognosis. Walk test at his slow pace with cane 185 feet x 3 laps: did NOT Desaturate  Smoking  - still iun remission  Cardiac  - no new problems  Lung Cancer screening  - holding off for now due to cost     Past, Family, Social reviewed: no change since last visit  #COPD  - Currently stable disease  - Please continue taking albuterol as needed  - continue daily Spiriva  - To help improve shortness of breath further  - Change advair to BREO 1 puff daily; learn technique  - have referred you to pulmonary rehabilitation at [facility]  - Use oxygen continuous at night while sleeping ; at this time you do not need oxygen with exertion  - We will also make sure you have refills of all meds  - Please get breathing test called full pulmonary function test as soon as possible  -  #Followup  -1 months with NP Tammy for med calendar and to see how BREO is doing for you  -Need FULL PFT AT time of followup with Tammy  -COPD cat score at followup  02/23/2013 Follow Up  1 month follow up - reports breathing is "about the same" but believes the Virgel Bouquet has helped.  would like to review PFT results.  smoking again 3-4cigs per day d/t side effects of the chantix; would like to discuss other therapies.  CAT = 28 Would like rx for wellbutrin . Can not afford nicotine patches.  PFT showed no airflow obstruction with FEV 1 at 84%, ratio 83  DLCO decreased at 60%. , no sign change s/p BD.  TLC 61% We discussed smoking cessation  Patient denies any hemoptysis, orthopnea, PND, or leg swelling .   Past, Family, Social reviewed: no change since last visit  REC Most important goal to quit smoking.  May try Wellbutrin 150 mg daily to help with smoking cessation.  Continue on Breo 1 puff daily  Continue on Spiriva 1 puff daily  follow up Dr. Marchelle Gearing in 2-3 months  and As needed     OV 03/23/2013   Followup emphysema. He is here with his fiance who I met for the firest time. Overall dyspnea is slightly worse. He is been diagnosed with anemia. He says his cardiac status is stable. It looks like his COPD is also stable but his dyspnea is worse and ? Due to anemia/ddeconditioning.Marland Kitchen He cannot undergo pulmonary rehabilitation because of back issues.   Apparently the anemia is new andfound out duringa followup to his liver lesion seen on CT scan of the chest below and there is concern on the new MRI [which I  do not have results] that he might have  liver cancer and has been referred to an oncologist. Npote: patient and fiancee upset at me for not knowing medical detail about liver and are asking me to explain liver pathology. [place] PMD [place] at [place] and the MRI etc., not presentin our system. I spoke to her CMA  the following day and the PMD has already explained DDx and plan of action. They will call patient again and explain   He recently had CT chest r restrictive PFTs and the results is as below    IMPRESSION: CT chest 03/02/13 1. Mild paraseptal emphysema appears upper lobe predominant.  2. Sub solid nodule within the right upper lobe measures 4 mm.  This is most likely benign and no further follow-up is necessary.  3. There are two indeterminant structures within the right hepatic  lobe. One is a hyperattenuating. This may represent a flash fill  hemangioma, vascular phenomenon or benign or malignant  hypervascular tumor. The other lesion is intermediate in  attenuation. Further evaluation with contrast enhanced MRI would  be the study of choice to more definitively assess these  structures.  Original Report Authenticated By: Signa Kell, M.D.          Result Notes    REC  #EMphysema and shoirtness of breath - continue Breo - change spiriva to tudorza 1 puff twice daily (take sample) - if above does not help shortness of breath;  then  Will be very tough bcause of anemia, heart and back issues  #Liver lesion  - Will try to talk to your PCP Zoe Lan, NP to figure out what is going on; have them call you again   #Lung nodule 4mm RUL on CT August 2014 - need repeat CT chest Aug 2015  #Followup 3 months or sooner   OV 06/19/2013  Chief Complaint  Patient presents with  . COPD    follow-up. Pt states that wellbutrin is giving him bad dreams so he has stopped this.     COPD fu - tudorza and bREO. Was 34 and is worse than baseline. For the last 1 month he is an increase in cough and increase in sputum volume no change in sputum color of white. No change in baseline dyspnea. He thinks antibiotic could help him. He uses oxygen at night  Lung nodule: He is a 4 mm lung nodule in August 2014. Neck CT scan of the chest as in August 2015  New problem: He says that he snores and uses oxygen at night. But his multiple health issues and we've deferred followup to a future date  MRI Liver Lesions: He had a MRI of her liver lesions and this was growing lesions. He is due to see Dr. Madilyn Fireman shortly. I did tell him briefly about his results from the MRI today  SMoiking: Wellbutrin is causing severe dreams. He does not want to continue it. He does not want Chantix. He does not want nicotine patch due to cost. He says that he will decide between continuing Wellbutrin despite dreams versus stopping it    CAT COPD Symptom & Quality of Life Score (GSK trademark) 0 is no burden. 5 is highest burden 08/11/2012  01/10/2013  03/23/2013  06/19/2013   Never Cough -> Cough all the time 3 2 3 5   No phlegm in chest -> Chest is full of phlegm 0 2 3 4   No chest tightness -> Chest feels very tight 2 3 3  3  No dyspnea for 1 flight stairs/[place] -> Very dyspneic for 1 flight of stairs 5 5 5 5   No limitations for ADL at home -> Very limited with ADL at home 4 4 5 5   Confident leaving home -> Not at all confident leaving home 3 4 2 3   Sleep  soundly -> Do not sleep soundly because of lung condition 4 3 3 4   Lots of Energy -> No energy at all 4 3 5 5   TOTAL Score (max 40)  26 26 29  34   Review of Systems  Constitutional: Negative for fever and unexpected weight change.  HENT: Negative for congestion, dental problem, ear pain, nosebleeds, postnasal drip, rhinorrhea, sinus pressure, sneezing, sore throat and trouble swallowing.   Eyes: Negative for redness and itching.  Respiratory: Negative for cough, chest tightness, shortness of breath and wheezing.   Cardiovascular: Negative for palpitations and leg swelling.  Gastrointestinal: Negative for nausea and vomiting.  Genitourinary: Negative for dysuria.  Musculoskeletal: Negative for joint swelling.  Skin: Negative for rash.  Neurological: Negative for headaches.  Hematological: Does not bruise/bleed easily.  Psychiatric/Behavioral: Negative for dysphoric mood. The patient is not nervous/anxious.        Objective:   Physical Exam   Physical Exam Nursing note and vitals reviewed. Constitutional: He is oriented to person, place, and time. He appears well-developed and well-nourished. No distress.  HENT:  Head: Normocephalic and atraumatic.  Right Ear: External ear normal.  Left Ear: External ear normal.  Mouth/Throat: Oropharynx is clear and moist. No oropharyngeal exudate.  Eyes: Conjunctivae normal and EOM are normal. Pupils are equal, round, and reactive to light. Right eye exhibits no discharge. Left eye exhibits no discharge. No scleral icterus.  Neck: Normal range of motion. Neck supple. No JVD present. No tracheal deviation present. No thyromegaly present.  Cardiovascular: Normal rate, regular rhythm and intact distal pulses.  Exam reveals no gallop and no friction rub.   No murmur heard. Pulmonary/Chest: Effort normal and breath sounds normal. No respiratory distress. He has no wheezes. He has no rales. He exhibits no tenderness.  Abdominal: Soft. Bowel sounds are  normal. He exhibits no distension and no mass. There is no tenderness. There is no rebound and no guarding.  Musculoskeletal: Normal range of motion. He exhibits no edema and no tenderness.       Walks with cane  Lymphadenopathy:    He has no cervical adenopathy.  Neurological: He is alert and oriented to person, place, and time. He has normal reflexes. No cranial nerve deficit. Coordination normal.  Skin: Skin is warm and dry. No rash noted. He is not diaphoretic. No erythema. No pallor.  Psychiatric: He has a normal mood and affect. His behavior is normal. Judgment and thought content normal.        Assessment & Plan:  #COPD - might be in mild flare up  - Take doxycycline 100mg  po twice daily x 5 days; take after meals and avoid sunlight - continue tudorza and breo  #Lung nodule 4mm Aug 2014  - fu CT ches Aug 2015  #Liver lesion on MRI 06/19/2013  - please follow with Dr Madilyn Fireman  #Snoring  - address at future date  #SMoking  - too bad wellbutrin is causing dreams; recommend you stop it when convenient - chantix not an option due to prior bad experience and dream risk - you can try nicotine patch but understand $ issues   5 min face to face counseling  #  Followup  - 4 months or sooner if needed

## 2013-06-20 ENCOUNTER — Other Ambulatory Visit (HOSPITAL_COMMUNITY): Payer: Self-pay | Admitting: Gastroenterology

## 2013-06-20 DIAGNOSIS — R932 Abnormal findings on diagnostic imaging of liver and biliary tract: Secondary | ICD-10-CM

## 2013-06-21 ENCOUNTER — Other Ambulatory Visit (HOSPITAL_COMMUNITY): Payer: Self-pay | Admitting: Radiology

## 2013-06-22 ENCOUNTER — Encounter (HOSPITAL_COMMUNITY): Payer: Self-pay

## 2013-06-23 ENCOUNTER — Ambulatory Visit (HOSPITAL_COMMUNITY)
Admission: RE | Admit: 2013-06-23 | Discharge: 2013-06-23 | Disposition: A | Discharge status: Home / Self Care | Payer: Medicare Other | Source: Ambulatory Visit | Attending: Gastroenterology | Admitting: Gastroenterology

## 2013-06-23 ENCOUNTER — Encounter (HOSPITAL_COMMUNITY): Payer: Self-pay

## 2013-06-23 DIAGNOSIS — D1803 Hemangioma of intra-abdominal structures: Secondary | ICD-10-CM | POA: Insufficient documentation

## 2013-06-23 DIAGNOSIS — R932 Abnormal findings on diagnostic imaging of liver and biliary tract: Secondary | ICD-10-CM

## 2013-06-23 DIAGNOSIS — I509 Heart failure, unspecified: Secondary | ICD-10-CM | POA: Insufficient documentation

## 2013-06-23 DIAGNOSIS — E119 Type 2 diabetes mellitus without complications: Secondary | ICD-10-CM | POA: Insufficient documentation

## 2013-06-23 DIAGNOSIS — N289 Disorder of kidney and ureter, unspecified: Secondary | ICD-10-CM | POA: Insufficient documentation

## 2013-06-23 DIAGNOSIS — Z01812 Encounter for preprocedural laboratory examination: Secondary | ICD-10-CM | POA: Insufficient documentation

## 2013-06-23 DIAGNOSIS — I2510 Atherosclerotic heart disease of native coronary artery without angina pectoris: Secondary | ICD-10-CM | POA: Insufficient documentation

## 2013-06-23 DIAGNOSIS — E785 Hyperlipidemia, unspecified: Secondary | ICD-10-CM | POA: Insufficient documentation

## 2013-06-23 DIAGNOSIS — I10 Essential (primary) hypertension: Secondary | ICD-10-CM | POA: Insufficient documentation

## 2013-06-23 LAB — GLUCOSE, CAPILLARY
Glucose-Capillary: 136 mg/dL — ABNORMAL HIGH (ref 70–99)
Glucose-Capillary: 61 mg/dL — ABNORMAL LOW (ref 70–99)

## 2013-06-23 LAB — CBC
HCT: 34.3 % — ABNORMAL LOW (ref 39.0–52.0)
MCHC: 31.2 g/dL (ref 30.0–36.0)
MCV: 75.2 fL — ABNORMAL LOW (ref 78.0–100.0)
RDW: 17.2 % — ABNORMAL HIGH (ref 11.5–15.5)
WBC: 5.8 10*3/uL (ref 4.0–10.5)

## 2013-06-23 MED ORDER — FENTANYL CITRATE 0.05 MG/ML IJ SOLN
INTRAMUSCULAR | Status: AC
  Filled 2013-06-23: qty 2

## 2013-06-23 MED ORDER — FENTANYL CITRATE 0.05 MG/ML IJ SOLN
INTRAMUSCULAR | Status: AC | PRN
  Administered 2013-06-23 (×2): 25 ug via INTRAVENOUS

## 2013-06-23 MED ORDER — SODIUM CHLORIDE 0.9 % IV SOLN
Freq: Once | INTRAVENOUS | Status: AC
  Administered 2013-06-23: 09:00:00 via INTRAVENOUS

## 2013-06-23 MED ORDER — DEXTROSE 50 % IV SOLN
25.0000 mL | Freq: Once | INTRAVENOUS | Status: AC | PRN
  Filled 2013-06-23: qty 50

## 2013-06-23 MED ORDER — MIDAZOLAM HCL 2 MG/2ML IJ SOLN
INTRAMUSCULAR | Status: AC
  Filled 2013-06-23: qty 4

## 2013-06-23 MED ORDER — MIDAZOLAM HCL 2 MG/2ML IJ SOLN
INTRAMUSCULAR | Status: AC | PRN
  Administered 2013-06-23 (×2): 1 mg via INTRAVENOUS

## 2013-06-23 MED ORDER — DEXTROSE 50 % IV SOLN
INTRAVENOUS | Status: AC
  Administered 2013-06-23: 25 mL
  Filled 2013-06-23: qty 50

## 2013-06-23 NOTE — Procedures (Signed)
Successful RT HEPATIC MASS 18 G CORE BX NO COMP STABLE FULL REPORT IN PACS PATH PENDING

## 2013-06-23 NOTE — H&P (Signed)
James Zuniga is an 57 y.o. male.   Chief Complaint: scheduled for liver lesion biopsy Pt had seen primary MD several months reguarding sob and cough Work up 02/2013 with CT Chest revealed coincidental finding of liver lesions Repeat MRI scan 06/2013 shows enlarging Rt lobe liver lesion Tumor markers neg per Dr Madilyn Fireman note Now for liver lesion biopsy  HPI: Smoker; CHF; DM; HTN; HLD  Past Medical History  Diagnosis Date  . CHF (congestive heart failure)   . Coronary artery disease   . Diabetes mellitus   . Hypertension   . Renal disorder   . Hyperlipidemia     Past Surgical History  Procedure Laterality Date  . No past surgeries      Family History  Problem Relation Age of Onset  . Heart disease Mother   . Stroke Brother   . Diabetes Father    Social History:  reports that he has been smoking Cigarettes.  He has a 35 pack-year smoking history. He has never used smokeless tobacco. He reports that he does not drink alcohol. His drug history is not on file.  Allergies: No Known Allergies   (Not in a hospital admission)  Results for orders placed during the hospital encounter of 06/23/13 (from the past 48 hour(s))  GLUCOSE, CAPILLARY     Status: Abnormal   Collection Time    06/23/13  9:31 AM      Result Value Range   Glucose-Capillary 61 (*) 70 - 99 mg/dL   Comment 1 Notify RN     Comment 2 Documented in Chart     No results found.  Review of Systems  Constitutional: Negative for fever, chills and weight loss.  Respiratory: Negative for shortness of breath and wheezing.   Cardiovascular: Negative for chest pain.  Gastrointestinal: Positive for abdominal pain. Negative for nausea and vomiting.  Neurological: Negative for dizziness, weakness and headaches.  Psychiatric/Behavioral: Positive for substance abuse.       Smoker    Blood pressure 120/74, pulse 65, temperature 98 F (36.7 C), temperature source Oral, resp. rate 18, height 5\' 6"  (1.676 m), weight 186 lb  (84.369 kg), SpO2 100.00%. Physical Exam  Constitutional: He is oriented to person, place, and time. He appears well-developed and well-nourished.  Cardiovascular: Normal rate, regular rhythm and normal heart sounds.   No murmur heard. Respiratory: Effort normal. He has wheezes.  GI: Soft. Bowel sounds are normal. There is no tenderness.  Musculoskeletal: Normal range of motion.  Neurological: He is alert and oriented to person, place, and time.  Skin: Skin is warm and dry.  Psychiatric: He has a normal mood and affect. His behavior is normal. Judgment and thought content normal.     Assessment/Plan Abnormal liver lesions on CT 02/2013 3 mo follow up MRI shows lesions enlarging Scheduled now for biopsy for tissue diagnosis Tumor markers neg per Dr Madilyn Fireman note Pt and family aware of procedure benefits and risks and agreeable to proceed Consent signed and in chart  Christionna Poland A 06/23/2013, 9:49 AM

## 2013-06-28 ENCOUNTER — Telehealth: Payer: Self-pay | Admitting: Hematology & Oncology

## 2013-06-28 NOTE — Telephone Encounter (Signed)
I spoke w NEW PATIENT today to remind them of their appointment with Dr. Ennever. Also, advised them to bring all meds and insurance information. ° °

## 2013-06-30 ENCOUNTER — Encounter: Payer: Self-pay | Admitting: Internal Medicine

## 2013-06-30 ENCOUNTER — Other Ambulatory Visit (HOSPITAL_BASED_OUTPATIENT_CLINIC_OR_DEPARTMENT_OTHER): Payer: Medicare Other | Admitting: Lab

## 2013-06-30 ENCOUNTER — Ambulatory Visit: Payer: Medicare Other

## 2013-06-30 ENCOUNTER — Ambulatory Visit (HOSPITAL_BASED_OUTPATIENT_CLINIC_OR_DEPARTMENT_OTHER): Payer: Medicare Other | Admitting: Hematology & Oncology

## 2013-06-30 VITALS — BP 118/77 | HR 63 | Temp 98.5°F | Resp 18 | Ht 66.0 in | Wt 197.0 lb

## 2013-06-30 DIAGNOSIS — D509 Iron deficiency anemia, unspecified: Secondary | ICD-10-CM

## 2013-06-30 DIAGNOSIS — R634 Abnormal weight loss: Secondary | ICD-10-CM

## 2013-06-30 DIAGNOSIS — K7689 Other specified diseases of liver: Secondary | ICD-10-CM

## 2013-06-30 DIAGNOSIS — R799 Abnormal finding of blood chemistry, unspecified: Secondary | ICD-10-CM

## 2013-06-30 DIAGNOSIS — C799 Secondary malignant neoplasm of unspecified site: Secondary | ICD-10-CM

## 2013-06-30 DIAGNOSIS — D649 Anemia, unspecified: Secondary | ICD-10-CM

## 2013-06-30 DIAGNOSIS — E349 Endocrine disorder, unspecified: Secondary | ICD-10-CM

## 2013-06-30 LAB — CBC WITH DIFFERENTIAL (CANCER CENTER ONLY)
BASO#: 0 10*3/uL (ref 0.0–0.2)
EOS%: 3.7 % (ref 0.0–7.0)
HGB: 10.3 g/dL — ABNORMAL LOW (ref 13.0–17.1)
LYMPH#: 2 10*3/uL (ref 0.9–3.3)
MCH: 23.3 pg — ABNORMAL LOW (ref 28.0–33.4)
MCHC: 30.7 g/dL — ABNORMAL LOW (ref 32.0–35.9)
MCV: 76 fL — ABNORMAL LOW (ref 82–98)
MONO#: 0.4 10*3/uL (ref 0.1–0.9)
MONO%: 6.2 % (ref 0.0–13.0)
NEUT%: 59.4 % (ref 40.0–80.0)
RBC: 4.42 10*6/uL (ref 4.20–5.70)
WBC: 6.8 10*3/uL (ref 4.0–10.0)

## 2013-06-30 NOTE — Progress Notes (Signed)
This office note has been dictated.

## 2013-07-01 ENCOUNTER — Encounter: Payer: Self-pay | Admitting: Hematology & Oncology

## 2013-07-01 DIAGNOSIS — E349 Endocrine disorder, unspecified: Secondary | ICD-10-CM

## 2013-07-01 DIAGNOSIS — D509 Iron deficiency anemia, unspecified: Secondary | ICD-10-CM | POA: Insufficient documentation

## 2013-07-01 HISTORY — DX: Endocrine disorder, unspecified: E34.9

## 2013-07-01 HISTORY — DX: Iron deficiency anemia, unspecified: D50.9

## 2013-07-03 DIAGNOSIS — G4700 Insomnia, unspecified: Secondary | ICD-10-CM | POA: Insufficient documentation

## 2013-07-03 DIAGNOSIS — I739 Peripheral vascular disease, unspecified: Secondary | ICD-10-CM | POA: Insufficient documentation

## 2013-07-03 NOTE — Progress Notes (Signed)
CC:   Tracey Harries, M.D.  DIAGNOSES: 1. Microcytic anemia. 2. Undefined liver lesions. 3. Weight loss.  HISTORY OF PRESENT ILLNESS:  [name] is a nice 57 year old African American gentleman.  He __________ from [place].  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  He sustained an injury and was subsequently on this disability.  He has been down in [place] for a couple of years.  He is followed at [facility] for [facility].  Dr. Everlene Other sees him.  James Zuniga has a lot of health issues.  He has a congestive heart failure. I am not sure __________ how he has this.  He has COPD.  He smoked quite a bit.  He has lost quite a bit of weight.  He has lost 30 pounds over the past 6 months.  He has been found to be anemic.  He had a colonoscopy about a month ago. This reported normal.  He had an MRI of the abdomen.  I am not sure as to what prompted the MRI to be done.  The MRI showed lesions in the liver.  These apparently were seen on a prior MRI.  The lesions appeared to be slightly growing up.  He then underwent a biopsy of one of these lesions.  The pathology report ([id number]) showed hemangioma.  There is no evidence of malignancy.  He has had some iron studies done.  His ferritin was low.  His overall iron studies also were on the low side.  He does have diabetes.  His blood sugars have been fluctuating.  Back in this past December, a CBC was done which showed a white cell count of 5.8, hemoglobin 10.2, hematocrit 34.3, platelet count 310,000. MCV was 75.  A year ago, his white count was 97.8, hemoglobin 14, hematocrit 44, platelet count was 301,000.  MCV was 84.  He has a normal renal function with a BUN of 11, creatinine of 0.9. LFTs were normal.  __________ level was 239.  Ferritin was done which was 7.7.  He was kindly referred to the [facility] for an evaluation.  He gets around with a cane.  He injured his right leg from his work  at [employer] airport.  He has had no fevers.  He has had no change in bowel or bladder habits. He has had no cough.  He has had no nausea or vomiting.  He has had a decreased appetite.  He has not noticed any kind of rashes.  Again, he was referred to the [facility] to help with the evaluation of the anemia and liver issues.  PAST MEDICAL HISTORY:  Remarkable for, 1. COPD. 2. CHF. 3. GERD. 4. Insomnia. 5. Gout. 6. Osteoarthritis.  ALLERGIES:  None.  MEDICATIONS:  Aclidinium bromide 1 puff b.i.d., Proventil 2 puffs q.6 hours p.r.n., allopurinol 300 mg daily, Norvasc 5 mg p.o. daily, aspirin 81 mg p.o. daily, Voltaren gel 4 times a day, Lasix 20 mg p.o. b.i.d., Neurontin 300 mg p.o. t.i.d., metformin 500 mg p.o. b.i.d., Toprol-XL 50 mg p.o. daily, Benicar 20 mg p.o. daily, Prilosec 20 mg p.o. daily, potassium 10 mEq p.o. b.i.d., Zocor 20 mg p.o. daily, Axiron gel 2 applications daily, Zanaflex 2 mg b.i.d. as needed, Ultram ER 100 mg p.o. daily, Ambien 5 mg p.o. q.h.s.  SOCIAL HISTORY:  Remarkable for tobacco use.  He stopped a week ago.  He probably has about a 30-pack-year history of tobacco use.  There is no alcohol use.  He has no history of occupational exposures.  There is no history of blood transfusions.  FAMILY HISTORY:  Remarkable for multiple family members with cancer. There is no history of sickle cell in the family.  REVIEW OF SYSTEMS:  As stated in history of present illness.  No additional findings noted on 12-system review.  PHYSICAL EXAMINATION:  General:  This is a fairly well-developed, well- nourished, black gentleman who looks a little older than his stated age. Vital Signs:  Temperature of 98.5, pulse 63, respiratory rate 18, blood pressure 118/77.  Weight is 197.  Head and Neck:  Normocephalic, atraumatic skull.  He has no ocular or oral lesions.  There are no palpable cervical or supraclavicular lymph nodes.  There is no  palpable thyroid.  Lungs:  Clear bilaterally.  Cardiac:  Regular rate and rhythm with a normal S1, S2.  He has a 1/6 systolic ejection murmur.  Abdomen: Soft.  He has good bowel sounds.  There is no fluid wave.  There is no palpable abdominal mass.  There is no palpable hepatosplenomegaly. Back:  No tenderness over the spine, ribs, or hips.  Extremities:  Show no clubbing, cyanosis, or edema.  He has some age-related osteoarthritic changes in his joints.  Skin:  Shows no rashes.  Neurological:  No focal neurological deficits.  LABORATORY STUDIES:  White cell count 6.8, hemoglobin 10.3, hematocrit 33.6, platelet count 326.  MCV is 76.  Peripheral smear shows a slightly microcytic population of red blood cells.  There is no nucleated red blood cells.  I see no teardrop cells. There are no schistocytes or spherocytes.  There are no target cells. White cells appear normal morphology maturation.  He has no immature myeloid or lymphoid forms.  He has no hypersegmented polys.  There are no atypical lymphocytes.  He has no blasts.  Platelets are adequate in number and size.  IMPRESSION:  James Zuniga is a nice 57 year old African American gentleman. He has liver lesions.  I am not sure at all as to why the MRI was done. Unfortunately, I do not see any other x-rays that were taken on him that showed these lesions.  He had a biopsy which was a hemangioma.  I still think he is going to have a PET scan done.  I still worry about an unknown primary that could be causing the weight loss.  __________ they did have a CT scan of the chest done back in August. This did show emphysema.  He did have a 4-mm nodule in the right upper lung.  He had some indeterminate wheezing in the right hepatic lobe.  I suspect this probably was why the MRI was done.  We will see what his iron studies show.  He has been endoscoped.  There is no obvious GI bleeding but one suspects that he may still have some small  intestinal source.  Ultimately, he may need to have a bone marrow biopsy done.  I am checking an erythropoietin level on him.  It is possible that he is low with erythropoietin.  Ultimately, he may need to have a bone marrow biopsy done as stated previously.  We will see what all of the lab work shows.  We will then plan to get him back to see [place] about in a month.  By then, I have all the results that I need in order to try to figure out how we can help him out.  I spent a good hour with James Zuniga  and his fiancee.  They marry on Valentine's Day.    ______________________________ Josph Macho, M.D. PRE/MEDQ  D:  06/30/2013  T:  07/01/2013  Job:  [id number]

## 2013-07-04 LAB — RETICULOCYTES (CHCC)
RBC.: 4.49 MIL/uL (ref 4.22–5.81)
Retic Ct Pct: 1.1 % (ref 0.4–2.3)

## 2013-07-04 LAB — HEMOGLOBINOPATHY EVALUATION
Hemoglobin Other: 0 %
Hgb A2 Quant: 2 % — ABNORMAL LOW (ref 2.2–3.2)
Hgb F Quant: 0 % (ref 0.0–2.0)

## 2013-07-04 LAB — COMPREHENSIVE METABOLIC PANEL
Albumin: 3.8 g/dL (ref 3.5–5.2)
CO2: 26 mEq/L (ref 19–32)
Calcium: 8.8 mg/dL (ref 8.4–10.5)
Chloride: 105 mEq/L (ref 96–112)
Glucose, Bld: 78 mg/dL (ref 70–99)
Potassium: 3.1 mEq/L — ABNORMAL LOW (ref 3.5–5.3)
Sodium: 143 mEq/L (ref 135–145)
Total Protein: 6.9 g/dL (ref 6.0–8.3)

## 2013-07-04 LAB — TESTOSTERONE: Testosterone: 113 ng/dL — ABNORMAL LOW (ref 300–890)

## 2013-07-04 LAB — ERYTHROPOIETIN: Erythropoietin: 47 m[IU]/mL — ABNORMAL HIGH (ref 2.6–18.5)

## 2013-07-05 ENCOUNTER — Telehealth: Payer: Self-pay | Admitting: Nurse Practitioner

## 2013-07-05 NOTE — Telephone Encounter (Addendum)
Message copied by Glee Arvin on Wed Jul 05, 2013 10:17 AM ------      Message from: Josph Macho      Created: Sat Jul 01, 2013 10:13 AM       Call - iron is low and testosterone is low.  Need to set up with Feraheme 1020mg  IV and Depo-testosterone 400mg  IM next 1-2 weeks.            Pete ------Spoke with wife, and pt will give [place] a call back to schedule an appointment.

## 2013-07-12 ENCOUNTER — Telehealth: Payer: Self-pay | Admitting: *Deleted

## 2013-07-12 MED ORDER — DIAZEPAM 5 MG PO TABS: 5.0000 mg | ORAL_TABLET | Freq: Once | ORAL | Status: DC

## 2013-07-12 NOTE — Telephone Encounter (Signed)
Patient wanted something to take for Claustrophobia prior to CT scan.  Orders put in from Dr. Myna Hidalgo for Valium

## 2013-07-14 ENCOUNTER — Encounter (HOSPITAL_COMMUNITY)
Admission: RE | Admit: 2013-07-14 | Discharge: 2013-07-14 | Disposition: A | Discharge status: Home / Self Care | Payer: Medicare Other | Source: Ambulatory Visit | Attending: Hematology & Oncology | Admitting: Hematology & Oncology

## 2013-07-14 DIAGNOSIS — C799 Secondary malignant neoplasm of unspecified site: Secondary | ICD-10-CM

## 2013-07-14 DIAGNOSIS — C801 Malignant (primary) neoplasm, unspecified: Secondary | ICD-10-CM | POA: Insufficient documentation

## 2013-07-14 LAB — GLUCOSE, CAPILLARY: Glucose-Capillary: 98 mg/dL (ref 70–99)

## 2013-07-19 ENCOUNTER — Other Ambulatory Visit: Payer: Self-pay | Admitting: *Deleted

## 2013-07-19 MED ORDER — DIAZEPAM 5 MG PO TABS: 5.0000 mg | ORAL_TABLET | Freq: Once | ORAL | Status: DC

## 2013-07-19 NOTE — Telephone Encounter (Signed)
Sent another RX for Valium to [facility] because patient took Last pill prior to scan and then scanner was broken.

## 2013-07-20 ENCOUNTER — Other Ambulatory Visit (HOSPITAL_COMMUNITY): Payer: Medicare Other

## 2013-07-21 ENCOUNTER — Encounter (HOSPITAL_COMMUNITY)
Admission: RE | Admit: 2013-07-21 | Discharge: 2013-07-21 | Disposition: A | Discharge status: Home / Self Care | Payer: Medicare Other | Source: Ambulatory Visit | Attending: Hematology & Oncology | Admitting: Hematology & Oncology

## 2013-07-21 DIAGNOSIS — D1803 Hemangioma of intra-abdominal structures: Secondary | ICD-10-CM | POA: Insufficient documentation

## 2013-07-21 DIAGNOSIS — K7689 Other specified diseases of liver: Secondary | ICD-10-CM | POA: Insufficient documentation

## 2013-07-21 LAB — GLUCOSE, CAPILLARY: GLUCOSE-CAPILLARY: 78 mg/dL (ref 70–99)

## 2013-07-21 MED ORDER — FLUDEOXYGLUCOSE F - 18 (FDG) INJECTION: 16.0000 | Freq: Once | INTRAVENOUS | Status: AC | PRN

## 2013-07-25 ENCOUNTER — Other Ambulatory Visit: Payer: Self-pay | Admitting: Hematology & Oncology

## 2013-08-01 ENCOUNTER — Telehealth: Payer: Self-pay | Admitting: Hematology & Oncology

## 2013-08-01 NOTE — Telephone Encounter (Signed)
Elayne Guerin [facility] called to get consult note for new pt appt for 06/30/2013. Faxed to: [phone]   P: [phone]

## 2013-08-02 ENCOUNTER — Telehealth: Payer: Self-pay | Admitting: Internal Medicine

## 2013-08-02 NOTE — Telephone Encounter (Signed)
atc pt, na Samples are ready for pick up.

## 2013-08-04 ENCOUNTER — Encounter: Payer: Self-pay | Admitting: Hematology & Oncology

## 2013-08-04 ENCOUNTER — Other Ambulatory Visit (HOSPITAL_BASED_OUTPATIENT_CLINIC_OR_DEPARTMENT_OTHER): Payer: Medicare Other | Admitting: Lab

## 2013-08-04 ENCOUNTER — Ambulatory Visit (HOSPITAL_BASED_OUTPATIENT_CLINIC_OR_DEPARTMENT_OTHER): Payer: Medicare Other

## 2013-08-04 ENCOUNTER — Ambulatory Visit (HOSPITAL_BASED_OUTPATIENT_CLINIC_OR_DEPARTMENT_OTHER): Payer: Medicare Other | Admitting: Hematology & Oncology

## 2013-08-04 VITALS — BP 132/86 | HR 73 | Temp 98.3°F | Resp 18 | Ht 66.0 in | Wt 204.0 lb

## 2013-08-04 DIAGNOSIS — E349 Endocrine disorder, unspecified: Secondary | ICD-10-CM

## 2013-08-04 DIAGNOSIS — E291 Testicular hypofunction: Secondary | ICD-10-CM

## 2013-08-04 DIAGNOSIS — D509 Iron deficiency anemia, unspecified: Secondary | ICD-10-CM

## 2013-08-04 LAB — COMPREHENSIVE METABOLIC PANEL
ALBUMIN: 3.7 g/dL (ref 3.5–5.2)
ALK PHOS: 79 U/L (ref 39–117)
ALT: 17 U/L (ref 0–53)
AST: 21 U/L (ref 0–37)
BILIRUBIN TOTAL: 0.3 mg/dL (ref 0.3–1.2)
BUN: 8 mg/dL (ref 6–23)
CO2: 26 mEq/L (ref 19–32)
Calcium: 8.7 mg/dL (ref 8.4–10.5)
Chloride: 107 mEq/L (ref 96–112)
Creatinine, Ser: 0.88 mg/dL (ref 0.50–1.35)
GLUCOSE: 93 mg/dL (ref 70–99)
POTASSIUM: 3.5 meq/L (ref 3.5–5.3)
Sodium: 144 mEq/L (ref 135–145)
TOTAL PROTEIN: 7 g/dL (ref 6.0–8.3)

## 2013-08-04 LAB — CBC WITH DIFFERENTIAL (CANCER CENTER ONLY)
BASO#: 0 10*3/uL (ref 0.0–0.2)
BASO%: 0.5 % (ref 0.0–2.0)
EOS%: 3 % (ref 0.0–7.0)
Eosinophils Absolute: 0.2 10*3/uL (ref 0.0–0.5)
HCT: 33 % — ABNORMAL LOW (ref 38.7–49.9)
HGB: 9.8 g/dL — ABNORMAL LOW (ref 13.0–17.1)
LYMPH#: 1.7 10*3/uL (ref 0.9–3.3)
LYMPH%: 28.9 % (ref 14.0–48.0)
MCH: 22.8 pg — AB (ref 28.0–33.4)
MCHC: 29.7 g/dL — AB (ref 32.0–35.9)
MCV: 77 fL — AB (ref 82–98)
MONO#: 0.4 10*3/uL (ref 0.1–0.9)
MONO%: 6.1 % (ref 0.0–13.0)
NEUT#: 3.6 10*3/uL (ref 1.5–6.5)
NEUT%: 61.5 % (ref 40.0–80.0)
Platelets: 284 10*3/uL (ref 145–400)
RBC: 4.29 10*6/uL (ref 4.20–5.70)
RDW: 16.4 % — AB (ref 11.1–15.7)
WBC: 5.9 10*3/uL (ref 4.0–10.0)

## 2013-08-04 LAB — FERRITIN CHCC: FERRITIN: 17 ng/mL — AB (ref 22–316)

## 2013-08-04 LAB — CHCC SATELLITE - SMEAR

## 2013-08-04 LAB — IRON AND TIBC CHCC
%SAT: 5 % — AB (ref 20–55)
IRON: 24 ug/dL — AB (ref 42–163)
TIBC: 473 ug/dL — ABNORMAL HIGH (ref 202–409)
UIBC: 448 ug/dL — ABNORMAL HIGH (ref 117–376)

## 2013-08-04 LAB — TESTOSTERONE: Testosterone: 154 ng/dL — ABNORMAL LOW (ref 300–890)

## 2013-08-04 MED ORDER — TESTOSTERONE CYPIONATE 200 MG/ML IM SOLN
400.0000 mg | Freq: Once | INTRAMUSCULAR | Status: AC
  Administered 2013-08-04: 400 mg via INTRAMUSCULAR

## 2013-08-04 MED ORDER — SODIUM CHLORIDE 0.9 % IV SOLN
1020.0000 mg | Freq: Once | INTRAVENOUS | Status: AC
  Administered 2013-08-04: 1020 mg via INTRAVENOUS
  Filled 2013-08-04: qty 34

## 2013-08-04 MED ORDER — SODIUM CHLORIDE 0.9 % IV SOLN
Freq: Once | INTRAVENOUS | Status: AC
  Administered 2013-08-04: 12:00:00 via INTRAVENOUS

## 2013-08-04 MED ORDER — TESTOSTERONE CYPIONATE 200 MG/ML IM SOLN
INTRAMUSCULAR | Status: AC
  Filled 2013-08-04: qty 2

## 2013-08-04 NOTE — Progress Notes (Signed)
This office note has been dictated.

## 2013-08-04 NOTE — Patient Instructions (Signed)
Testosterone injection What is this medicine? TESTOSTERONE (tes TOS ter one) is the main male hormone. It supports normal male development such as muscle growth, facial hair, and deep voice. It is used in males to treat low testosterone levels. This medicine may be used for other purposes; ask your health care provider or pharmacist if you have questions. COMMON BRAND NAME(S): Andro-L.A., Aveed, Delatestryl, Depo-Testosterone, Virilon What should I tell my health care provider before I take this medicine? They need to know if you have any of these conditions: -breast cancer -diabetes -heart disease -kidney disease -liver disease -lung disease -prostate cancer, enlargement -an unusual or allergic reaction to testosterone, other medicines, foods, dyes, or preservatives -pregnant or trying to get pregnant -breast-feeding How should I use this medicine? This medicine is for injection into a muscle. It is usually given by a health care professional in a hospital or clinic setting. Contact your pediatrician regarding the use of this medicine in children. While this medicine may be prescribed for children as young as 32 years of age for selected conditions, precautions do apply. Overdosage: If you think you have taken too much of this medicine contact a poison control center or emergency room at once. NOTE: This medicine is only for you. Do not share this medicine with others. What if I miss a dose? Try not to miss a dose. Your doctor or health care professional will tell you when your next injection is due. Notify the office if you are unable to keep an appointment. What may interact with this medicine? -medicines for diabetes -medicines that treat or prevent blood clots like warfarin -oxyphenbutazone -propranolol -steroid medicines like prednisone or cortisone This list may not describe all possible interactions. Give your health care provider a list of all the medicines, herbs,  non-prescription drugs, or dietary supplements you use. Also tell them if you smoke, drink alcohol, or use illegal drugs. Some items may interact with your medicine. What should I watch for while using this medicine? Visit your doctor or health care professional for regular checks on your progress. They will need to check the level of testosterone in your blood. This medicine may affect blood sugar levels. If you have diabetes, check with your doctor or health care professional before you change your diet or the dose of your diabetic medicine. This drug is banned from use in athletes by most athletic organizations. What side effects may I notice from receiving this medicine? Side effects that you should report to your doctor or health care professional as soon as possible: -allergic reactions like skin rash, itching or hives, swelling of the face, lips, or tongue -breast enlargement -breathing problems -changes in mood, especially anger, depression, or rage -dark urine -general ill feeling or flu-like symptoms -light-colored stools -loss of appetite, nausea -nausea, vomiting -right upper belly pain -stomach pain -swelling of ankles -too frequent or persistent erections -trouble passing urine or change in the amount of urine -unusually weak or tired -yellowing of the eyes or skin Additional side effects that can occur in women include: -deep or hoarse voice -facial hair growth -irregular menstrual periods Side effects that usually do not require medical attention (report to your doctor or health care professional if they continue or are bothersome): -acne -change in sex drive or performance -hair loss -headache This list may not describe all possible side effects. Call your doctor for medical advice about side effects. You may report side effects to FDA at 1-800-FDA-1088. Where should I keep my medicine? Keep  out of the reach of children. This medicine can be abused. Keep your  medicine in a safe place to protect it from theft. Do not share this medicine with anyone. Selling or giving away this medicine is dangerous and against the law. Store at room temperature between 20 and 25 degrees C (68 and 77 degrees F). Do not freeze. Protect from light. Follow the directions for the product you are prescribed. Throw away any unused medicine after the expiration date. NOTE: This sheet is a summary. It may not cover all possible information. If you have questions about this medicine, talk to your doctor, pharmacist, or health care provider.  2014, Elsevier/Gold Standard. (2007-09-09 16:13:46) Ferumoxytol injection What is this medicine? FERUMOXYTOL is an iron complex. Iron is used to make healthy red blood cells, which carry oxygen and nutrients throughout the body. This medicine is used to treat iron deficiency anemia in people with chronic kidney disease. This medicine may be used for other purposes; ask your health care provider or pharmacist if you have questions. COMMON BRAND NAME(S): Feraheme  What should I tell my health care provider before I take this medicine? They need to know if you have any of these conditions: -anemia not caused by low iron levels -high levels of iron in the blood -magnetic resonance imaging (MRI) test scheduled -an unusual or allergic reaction to iron, other medicines, foods, dyes, or preservatives -pregnant or trying to get pregnant -breast-feeding How should I use this medicine? This medicine is for injection into a vein. It is given by a health care professional in a hospital or clinic setting. Talk to your pediatrician regarding the use of this medicine in children. Special care may be needed. Overdosage: If you think you've taken too much of this medicine contact a poison control center or emergency room at once. Overdosage: If you think you have taken too much of this medicine contact a poison control center or emergency room at  once. NOTE: This medicine is only for you. Do not share this medicine with others. What if I miss a dose? It is important not to miss your dose. Call your doctor or health care professional if you are unable to keep an appointment. What may interact with this medicine? This medicine may interact with the following medications: -other iron products This list may not describe all possible interactions. Give your health care provider a list of all the medicines, herbs, non-prescription drugs, or dietary supplements you use. Also tell them if you smoke, drink alcohol, or use illegal drugs. Some items may interact with your medicine. What should I watch for while using this medicine? Visit your doctor or healthcare professional regularly. Tell your doctor or healthcare professional if your symptoms do not start to get better or if they get worse. You may need blood work done while you are taking this medicine. You may need to follow a special diet. Talk to your doctor. Foods that contain iron include: whole grains/cereals, dried fruits, beans, or peas, leafy green vegetables, and organ meats (liver, kidney). What side effects may I notice from receiving this medicine? Side effects that you should report to your doctor or health care professional as soon as possible: -allergic reactions like skin rash, itching or hives, swelling of the face, lips, or tongue -breathing problems -changes in blood pressure -feeling faint or lightheaded, falls -fever or chills -flushing, sweating, or hot feelings -swelling of the ankles or feet Side effects that usually do not require medical attention (Report  these to your doctor or health care professional if they continue or are bothersome.): -diarrhea -headache -nausea, vomiting -stomach pain This list may not describe all possible side effects. Call your doctor for medical advice about side effects. You may report side effects to FDA at 1-800-FDA-1088. Where  should I keep my medicine? This drug is given in a hospital or clinic and will not be stored at home. NOTE: This sheet is a summary. It may not cover all possible information. If you have questions about this medicine, talk to your doctor, pharmacist, or health care provider.  2014, Elsevier/Gold Standard. (2012-02-12 15:23:36)

## 2013-08-07 NOTE — Progress Notes (Signed)
CC:   James Abrahams, NP  DIAGNOSES: 1. Iron-deficiency anemia. 2. Hypotestosteronemia.  CURRENT THERAPY: 1. IV iron as indicated - the patient received a dose today. 2. Testosterone 400 mg IM monthly.  INTERIM HISTORY:  James Zuniga comes in for followup.  This is his second office visit.  When we first saw him, __________ he had "liver lesions." We went ahead and got a PET scan on him.  Thankfully, the PET scan did not show any activity that was suspicious __________ liver.  I am not sure what these liver lesions are, but again we have not had any suspicious evidence to support any kind of malignancy.  We did find out that he was iron deficient.  We did do extensive lab studies on him when he came in.  His ferritin was only 11 and iron saturation of 6%.  I am not sure if he has had a colonoscopy or not.  I will have to check into this.  His testosterone level was quite low.  His testosterone level was only 113.  We did do an erythropoietin level on him.  It was 47.  Hemoglobin electrophoresis was done, which showed normal hemoglobin values.  His CEA was 2.8.  Again, we are going to have to see about the possibility of GI evaluation for colonoscopy and possibly upper endoscopy.  He has gained a little weight since we last saw him.  He is on quite a few medications.  He is diabetic.  He is on aspirin and Celebrex.  He is a little bit better.  He still has little weakness in his legs.  PHYSICAL EXAMINATION:  General:  This is a well-developed, well- nourished African American gentleman in no obvious distress.  Vital Signs:  Temperature of 98.3, pulse 73, respiratory rate 18, blood pressure 132/86, and weight is 204 pounds.  Head and Neck: Normocephalic, atraumatic skull.  There are no ocular or oral lesions. There are no palpable cervical or supraclavicular lymph nodes.  Lungs: Clear bilaterally.  Cardiac:  Regular rate and rhythm with normal S1 and S2.  There are no  murmurs, rubs, or bruits.  Abdomen is soft.  He has good bowel sounds.  There is no palpable abdominal mass.  There is no palpable hepatosplenomegaly.  Extremities show no clubbing, cyanosis, or edema.  Neurological exam shows no focal neurological deficit.  Skin exam shows no rashes, ecchymoses, or petechia.  LABORATORY STUDIES:  White cell count is 5.9, hemoglobin 9.8, hematocrit 33, platelet count 284.  MCV is 77.  On his peripheral smear, he does have some anisocytosis and poikilocytosis.  There are no target cells.  I see some polychromasia. He has no rouleaux formation.  White cells are of normal morphology and maturation.  There is no immature myeloid or lymphoid forms.  He has no hypersegmented polys.  Platelets are adequate in number and size.  IMPRESSION:  James Zuniga is a very nice 58 year old African American gentleman.  He has microcytic anemia.  He has very low testosterone levels.  He was on Exelon.  This certainly has not helped him now.  As such, we had to give him intramuscular testosterone.  I think with testosterone and iron replacement, he will feel better.  I do want to see him back in a month.  I reviewed all his lab work with him.  I reviewed his MRI with him.  I looked at his blood smear.  I spent a good half an hour with he and his wife.  He is very, very nice.  I just want to try to get him to feel better.    ______________________________ Volanda Napoleon, M.D. PRE/MEDQ  D:  08/04/2013  T:  08/05/2013  Job:  [id number]

## 2013-08-11 DIAGNOSIS — E669 Obesity, unspecified: Secondary | ICD-10-CM | POA: Insufficient documentation

## 2013-08-16 DIAGNOSIS — J439 Emphysema, unspecified: Secondary | ICD-10-CM | POA: Insufficient documentation

## 2013-08-16 DIAGNOSIS — J449 Chronic obstructive pulmonary disease, unspecified: Secondary | ICD-10-CM | POA: Insufficient documentation

## 2013-08-16 DIAGNOSIS — G8929 Other chronic pain: Secondary | ICD-10-CM | POA: Insufficient documentation

## 2013-08-31 ENCOUNTER — Ambulatory Visit (HOSPITAL_BASED_OUTPATIENT_CLINIC_OR_DEPARTMENT_OTHER): Payer: Medicare Other | Admitting: Hematology & Oncology

## 2013-08-31 ENCOUNTER — Other Ambulatory Visit (HOSPITAL_BASED_OUTPATIENT_CLINIC_OR_DEPARTMENT_OTHER): Payer: Medicare Other | Admitting: Lab

## 2013-08-31 ENCOUNTER — Ambulatory Visit (HOSPITAL_BASED_OUTPATIENT_CLINIC_OR_DEPARTMENT_OTHER): Payer: Medicare Other

## 2013-08-31 ENCOUNTER — Encounter: Payer: Self-pay | Admitting: Hematology & Oncology

## 2013-08-31 VITALS — BP 134/96 | HR 73 | Temp 98.2°F | Resp 18 | Ht 65.0 in | Wt 201.0 lb

## 2013-08-31 DIAGNOSIS — D509 Iron deficiency anemia, unspecified: Secondary | ICD-10-CM

## 2013-08-31 DIAGNOSIS — E291 Testicular hypofunction: Secondary | ICD-10-CM

## 2013-08-31 DIAGNOSIS — E349 Endocrine disorder, unspecified: Secondary | ICD-10-CM

## 2013-08-31 LAB — CBC WITH DIFFERENTIAL (CANCER CENTER ONLY)
BASO#: 0 10*3/uL (ref 0.0–0.2)
BASO%: 0.4 % (ref 0.0–2.0)
EOS%: 3.4 % (ref 0.0–7.0)
Eosinophils Absolute: 0.2 10*3/uL (ref 0.0–0.5)
HEMATOCRIT: 40.5 % (ref 38.7–49.9)
HEMOGLOBIN: 12.6 g/dL — AB (ref 13.0–17.1)
LYMPH#: 1.6 10*3/uL (ref 0.9–3.3)
LYMPH%: 30.6 % (ref 14.0–48.0)
MCH: 25.2 pg — AB (ref 28.0–33.4)
MCHC: 31.1 g/dL — ABNORMAL LOW (ref 32.0–35.9)
MCV: 81 fL — AB (ref 82–98)
MONO#: 0.3 10*3/uL (ref 0.1–0.9)
MONO%: 5.7 % (ref 0.0–13.0)
NEUT#: 3.2 10*3/uL (ref 1.5–6.5)
NEUT%: 59.9 % (ref 40.0–80.0)
Platelets: 279 10*3/uL (ref 145–400)
RBC: 5 10*6/uL (ref 4.20–5.70)
RDW: 22.1 % — ABNORMAL HIGH (ref 11.1–15.7)
WBC: 5.3 10*3/uL (ref 4.0–10.0)

## 2013-08-31 LAB — CHCC SATELLITE - SMEAR

## 2013-08-31 LAB — TESTOSTERONE: TESTOSTERONE: 130 ng/dL — AB (ref 300–890)

## 2013-08-31 MED ORDER — TESTOSTERONE CYPIONATE 200 MG/ML IM SOLN
INTRAMUSCULAR | Status: AC
  Filled 2013-08-31: qty 2

## 2013-08-31 MED ORDER — ALTEPLASE 2 MG IJ SOLR
2.0000 mg | Freq: Once | INTRAMUSCULAR | Status: DC | PRN
  Filled 2013-08-31: qty 2

## 2013-08-31 MED ORDER — HEPARIN SOD (PORK) LOCK FLUSH 100 UNIT/ML IV SOLN
500.0000 [IU] | Freq: Once | INTRAVENOUS | Status: DC | PRN
  Filled 2013-08-31: qty 5

## 2013-08-31 MED ORDER — SODIUM CHLORIDE 0.9 % IJ SOLN
10.0000 mL | INTRAMUSCULAR | Status: DC | PRN
  Filled 2013-08-31: qty 10

## 2013-08-31 MED ORDER — TESTOSTERONE CYPIONATE 200 MG/ML IM SOLN
400.0000 mg | Freq: Once | INTRAMUSCULAR | Status: AC
  Administered 2013-08-31: 400 mg via INTRAMUSCULAR

## 2013-08-31 MED ORDER — HEPARIN SOD (PORK) LOCK FLUSH 100 UNIT/ML IV SOLN
250.0000 [IU] | Freq: Once | INTRAVENOUS | Status: DC | PRN
  Filled 2013-08-31: qty 5

## 2013-08-31 MED ORDER — TADALAFIL 20 MG PO TABS: 20.0000 mg | ORAL_TABLET | Freq: Every day | ORAL | Status: DC | PRN

## 2013-08-31 MED ORDER — SODIUM CHLORIDE 0.9 % IJ SOLN
3.0000 mL | Freq: Once | INTRAMUSCULAR | Status: DC | PRN
  Filled 2013-08-31: qty 10

## 2013-08-31 NOTE — Progress Notes (Signed)
Hematology and Oncology Follow Up Visit  James Zuniga [id number] [birth year] 58 y.o. 08/31/2013   Principle Diagnosis:   Iron deficiency anemia Hypo-testosteronemia Chronic liver lesions-undefined Current Therapy:    IV iron as indicated  Depo testosterone 400 mg IM every 4 weeks     Interim History:  James Zuniga is back for followup. He is improving. He feel a little better. We last saw him, his testosterone level was very low. We went ahead and gave him some supplemental testosterone via intramuscular injection.  He has responded nicely to IV iron.we will see was on levels are.  He still has some weakness.  He says that he has gained some more functioning back in his genital region. This makes him satisfied. He feels his quality of life is getting better.  He's had no fever. He's had no cough. He's had no shortness of breath. He's had no bowel pain. There's been no change in bowel or bladder habits.  Medications: Current outpatient prescriptions:Aclidinium Bromide 400 MCG/ACT AEPB, Inhale 1 puff into the lungs 2 (two) times daily., Disp: 1 each, Rfl: 0;  albuterol (PROVENTIL HFA;VENTOLIN HFA) 108 (90 BASE) MCG/ACT inhaler, Inhale 2 puffs into the lungs every 6 (six) hours as needed. For shortness of breath, Disp: 1 Inhaler, Rfl: 4;  allopurinol (ZYLOPRIM) 300 MG tablet, Take 300 mg by mouth daily., Disp: , Rfl:  amLODipine (NORVASC) 5 MG tablet, Take 5 mg by mouth daily., Disp: , Rfl: ;  aspirin 81 MG EC tablet, Take 81 mg by mouth daily. Swallow whole., Disp: , Rfl: ;  AXIRON 30 MG/ACT SOLN, , Disp: , Rfl: ;  Fluticasone Furoate-Vilanterol (BREO ELLIPTA) 100-25 MCG/INH AEPB, Inhale 2 puffs into the lungs 2 (two) times daily., Disp: , Rfl: ;  furosemide (LASIX) 20 MG tablet, Take 1 tablet by mouth 2 (two) times daily., Disp: , Rfl:  gabapentin (NEURONTIN) 300 MG capsule, Take 300 mg by mouth 3 (three) times daily. , Disp: , Rfl: ;  ibuprofen (ADVIL,MOTRIN) 600 MG tablet, Take 600 mg by  mouth 2 (two) times daily., Disp: , Rfl: ;  losartan (COZAAR) 50 MG tablet, , Disp: , Rfl: ;  metFORMIN (GLUCOPHAGE) 500 MG tablet, Take 500 mg by mouth 2 (two) times daily with a meal., Disp: , Rfl:  metoprolol succinate (TOPROL-XL) 50 MG 24 hr tablet, Take 50 mg by mouth daily. Take with or immediately following a meal., Disp: , Rfl: ;  olmesartan (BENICAR) 20 MG tablet, Take 20 mg by mouth daily., Disp: , Rfl: ;  potassium chloride (K-DUR,KLOR-CON) 10 MEQ tablet, Take 10 mEq by mouth 2 (two) times daily. , Disp: , Rfl: ;  simvastatin (ZOCOR) 20 MG tablet, Take 1 tablet by mouth Daily., Disp: , Rfl:  tiZANidine (ZANAFLEX) 2 MG tablet, Take 1 tablet by mouth 2 (two) times daily as needed for muscle spasms. , Disp: , Rfl: ;  VOLTAREN 1 % GEL, , Disp: , Rfl: ;  zolpidem (AMBIEN) 5 MG tablet, Take 5 mg by mouth at bedtime., Disp: , Rfl:   Allergies: No Known Allergies  Past Medical History, Surgical history, Social history, and Family History were reviewed and updated.  Review of Systems: As above  Physical Exam:  height is 5\' 5"  (1.651 m) and weight is 201 lb (91.173 kg). His oral temperature is 98.2 F (36.8 C). His blood pressure is 134/96 and his pulse is 73. His respiration is 18.   We'll go well-nourished gentleman. His lungs are clear. Abdomen is  soft. He has good bowel sounds. There is no fluid wave. He has no palpable liver or spleen. Cardiac exam is regular in rhythm. Extremities shows some weakness that is chronic in both legs. Has no swelling. Skin with no rashes. Head and neck exam shows no adenopathy. Has no oral lesions. Neurological exam is unremarkable.  Lab Results  Component Value Date   WBC 5.3 08/31/2013   HGB 12.6* 08/31/2013   HCT 40.5 08/31/2013   MCV 81* 08/31/2013   PLT 279 08/31/2013     Chemistry      Component Value Date/Time   NA 144 08/04/2013 1028   K 3.5 08/04/2013 1028   CL 107 08/04/2013 1028   CO2 26 08/04/2013 1028   BUN 8 08/04/2013 1028   CREATININE 0.88  08/04/2013 1028      Component Value Date/Time   CALCIUM 8.7 08/04/2013 1028   ALKPHOS 79 08/04/2013 1028   AST 21 08/04/2013 1028   ALT 17 08/04/2013 1028   BILITOT 0.3 08/04/2013 1028         Impression and Plan: James Zuniga is a 58 year old gentleman. He has iron deficiency anemia. He has multiple medical problems. He is on a lot of medications. He has a colonoscopy within the past year.  We will see what his levels show. Is possible he may need another dose of IV iron.  We will give him testosterone today.  I will see him back in one more month.   Volanda Napoleon, MD 2/19/20152:40 PM

## 2013-08-31 NOTE — Patient Instructions (Signed)
You Can Quit Smoking If you are ready to quit smoking or are thinking about it, congratulations! You have chosen to help yourself be healthier and live longer! There are lots of different ways to quit smoking. Nicotine gum, nicotine patches, a nicotine inhaler, or nicotine nasal spray can help with physical craving. Hypnosis, support groups, and medicines help break the habit of smoking. TIPS TO GET OFF AND STAY OFF CIGARETTES  Learn to predict your moods. Do not let a bad situation be your excuse to have a cigarette. Some situations in your life might tempt you to have a cigarette.  Ask friends and co-workers not to smoke around you.  Make your home smoke-free.  Never have "just one" cigarette. It leads to wanting another and another. Remind yourself of your decision to quit.  On a card, make a list of your reasons for not smoking. Read it at least the same number of times a day as you have a cigarette. Tell yourself everyday, "I do not want to smoke. I choose not to smoke."  Ask someone at home or work to help you with your plan to quit smoking.  Have something planned after you eat or have a cup of coffee. Take a walk or get other exercise to perk you up. This will help to keep you from overeating.  Try a relaxation exercise to calm you down and decrease your stress. Remember, you may be tense and nervous the first two weeks after you quit. This will pass.  Find new activities to keep your hands busy. Play with a pen, coin, or rubber band. Doodle or draw things on paper.  Brush your teeth right after eating. This will help cut down the craving for the taste of tobacco after meals. You can try mouthwash too.  Try gum, breath mints, or diet candy to keep something in your mouth. IF YOU SMOKE AND WANT TO QUIT:  Do not stock up on cigarettes. Never buy a carton. Wait until one pack is finished before you buy another.  Never carry cigarettes with you at work or at home.  Keep cigarettes  as far away from you as possible. Leave them with someone else.  Never carry matches or a lighter with you.  Ask yourself, "Do I need this cigarette or is this just a reflex?"  Bet with someone that you can quit. Put cigarette money in a piggy bank every morning. If you smoke, you give up the money. If you do not smoke, by the end of the week, you keep the money.  Keep trying. It takes 21 days to change a habit!  Talk to your doctor about using medicines to help you quit. These include nicotine replacement gum, lozenges, or skin patches. Document Released: 04/25/2009 Document Revised: 09/21/2011 Document Reviewed: 04/25/2009 Exit[employer] Patient Information 2014 [employer].  

## 2013-08-31 NOTE — Patient Instructions (Signed)
Testosterone injection What is this medicine? TESTOSTERONE (tes TOS ter one) is the main male hormone. It supports normal male development such as muscle growth, facial hair, and deep voice. It is used in males to treat low testosterone levels. This medicine may be used for other purposes; ask your health care provider or pharmacist if you have questions. COMMON BRAND NAME(S): Andro-L.A., Aveed, Delatestryl, Depo-Testosterone, Virilon What should I tell my health care provider before I take this medicine? They need to know if you have any of these conditions: -breast cancer -diabetes -heart disease -kidney disease -liver disease -lung disease -prostate cancer, enlargement -an unusual or allergic reaction to testosterone, other medicines, foods, dyes, or preservatives -pregnant or trying to get pregnant -breast-feeding How should I use this medicine? This medicine is for injection into a muscle. It is usually given by a health care professional in a hospital or clinic setting. Contact your pediatrician regarding the use of this medicine in children. While this medicine may be prescribed for children as young as 32 years of age for selected conditions, precautions do apply. Overdosage: If you think you have taken too much of this medicine contact a poison control center or emergency room at once. NOTE: This medicine is only for you. Do not share this medicine with others. What if I miss a dose? Try not to miss a dose. Your doctor or health care professional will tell you when your next injection is due. Notify the office if you are unable to keep an appointment. What may interact with this medicine? -medicines for diabetes -medicines that treat or prevent blood clots like warfarin -oxyphenbutazone -propranolol -steroid medicines like prednisone or cortisone This list may not describe all possible interactions. Give your health care provider a list of all the medicines, herbs,  non-prescription drugs, or dietary supplements you use. Also tell them if you smoke, drink alcohol, or use illegal drugs. Some items may interact with your medicine. What should I watch for while using this medicine? Visit your doctor or health care professional for regular checks on your progress. They will need to check the level of testosterone in your blood. This medicine may affect blood sugar levels. If you have diabetes, check with your doctor or health care professional before you change your diet or the dose of your diabetic medicine. This drug is banned from use in athletes by most athletic organizations. What side effects may I notice from receiving this medicine? Side effects that you should report to your doctor or health care professional as soon as possible: -allergic reactions like skin rash, itching or hives, swelling of the face, lips, or tongue -breast enlargement -breathing problems -changes in mood, especially anger, depression, or rage -dark urine -general ill feeling or flu-like symptoms -light-colored stools -loss of appetite, nausea -nausea, vomiting -right upper belly pain -stomach pain -swelling of ankles -too frequent or persistent erections -trouble passing urine or change in the amount of urine -unusually weak or tired -yellowing of the eyes or skin Additional side effects that can occur in women include: -deep or hoarse voice -facial hair growth -irregular menstrual periods Side effects that usually do not require medical attention (report to your doctor or health care professional if they continue or are bothersome): -acne -change in sex drive or performance -hair loss -headache This list may not describe all possible side effects. Call your doctor for medical advice about side effects. You may report side effects to FDA at 1-800-FDA-1088. Where should I keep my medicine? Keep  out of the reach of children. This medicine can be abused. Keep your  medicine in a safe place to protect it from theft. Do not share this medicine with anyone. Selling or giving away this medicine is dangerous and against the law. Store at room temperature between 20 and 25 degrees C (68 and 77 degrees F). Do not freeze. Protect from light. Follow the directions for the product you are prescribed. Throw away any unused medicine after the expiration date. NOTE: This sheet is a summary. It may not cover all possible information. If you have questions about this medicine, talk to your doctor, pharmacist, or health care provider.  2014, Elsevier/Gold Standard. (2007-09-09 16:13:46)

## 2013-09-01 DIAGNOSIS — D649 Anemia, unspecified: Secondary | ICD-10-CM | POA: Insufficient documentation

## 2013-09-01 LAB — FERRITIN CHCC: Ferritin: 108 ng/ml (ref 22–316)

## 2013-09-01 LAB — IRON AND TIBC CHCC
%SAT: 17 % — ABNORMAL LOW (ref 20–55)
IRON: 61 ug/dL (ref 42–163)
TIBC: 356 ug/dL (ref 202–409)
UIBC: 296 ug/dL (ref 117–376)

## 2013-09-04 ENCOUNTER — Telehealth: Payer: Self-pay | Admitting: Nurse Practitioner

## 2013-09-04 ENCOUNTER — Other Ambulatory Visit: Payer: Self-pay | Admitting: Nurse Practitioner

## 2013-09-04 DIAGNOSIS — I10 Essential (primary) hypertension: Secondary | ICD-10-CM | POA: Insufficient documentation

## 2013-09-04 DIAGNOSIS — E119 Type 2 diabetes mellitus without complications: Secondary | ICD-10-CM | POA: Insufficient documentation

## 2013-09-04 DIAGNOSIS — D509 Iron deficiency anemia, unspecified: Secondary | ICD-10-CM

## 2013-09-04 DIAGNOSIS — E349 Endocrine disorder, unspecified: Secondary | ICD-10-CM

## 2013-09-04 NOTE — Telephone Encounter (Addendum)
Message copied by Jimmy Footman on Mon Sep 04, 2013  9:50 AM ------      Message from: Volanda Napoleon      Created: Sun Sep 03, 2013  9:17 AM       Call - iron is still low!!  Need to set up Feraheme 1020mg  x 1 dose in 1 week.  Also make sure that he gets another dose of Testosterone - 400mg  - when he get the iron!!  Pete ------LVM with wife. Appointment has been made for 3/5 @9 . Pt will call back and reschedule if this is not a good time.

## 2013-09-12 ENCOUNTER — Other Ambulatory Visit: Payer: Self-pay | Admitting: Gastroenterology

## 2013-09-12 DIAGNOSIS — R932 Abnormal findings on diagnostic imaging of liver and biliary tract: Secondary | ICD-10-CM

## 2013-09-14 ENCOUNTER — Ambulatory Visit (HOSPITAL_BASED_OUTPATIENT_CLINIC_OR_DEPARTMENT_OTHER): Payer: Medicare Other

## 2013-09-14 ENCOUNTER — Telehealth: Payer: Self-pay | Admitting: Internal Medicine

## 2013-09-14 VITALS — BP 130/92 | HR 76 | Temp 97.2°F | Resp 20

## 2013-09-14 DIAGNOSIS — D509 Iron deficiency anemia, unspecified: Secondary | ICD-10-CM

## 2013-09-14 DIAGNOSIS — E291 Testicular hypofunction: Secondary | ICD-10-CM

## 2013-09-14 MED ORDER — TESTOSTERONE CYPIONATE 200 MG/ML IM SOLN
400.0000 mg | Freq: Once | INTRAMUSCULAR | Status: AC
  Administered 2013-09-14: 400 mg via INTRAMUSCULAR

## 2013-09-14 MED ORDER — TESTOSTERONE CYPIONATE 200 MG/ML IM SOLN
INTRAMUSCULAR | Status: AC
  Filled 2013-09-14: qty 1

## 2013-09-14 MED ORDER — BUDESONIDE-FORMOTEROL FUMARATE 80-4.5 MCG/ACT IN AERO: 2.0000 | INHALATION_SPRAY | Freq: Two times a day (BID) | RESPIRATORY_TRACT | Status: DC

## 2013-09-14 MED ORDER — SODIUM CHLORIDE 0.9 % IV SOLN
1020.0000 mg | Freq: Once | INTRAVENOUS | Status: AC
  Administered 2013-09-14: 1020 mg via INTRAVENOUS
  Filled 2013-09-14: qty 34

## 2013-09-14 MED ORDER — SODIUM CHLORIDE 0.9 % IV SOLN
Freq: Once | INTRAVENOUS | Status: AC
  Administered 2013-09-14: 10:00:00 via INTRAVENOUS

## 2013-09-14 NOTE — Telephone Encounter (Signed)
Spoke with the pt  He states even with ins coverage, breo and tudorza cost him 400 $  He needs samples b/c he can not afford this  Do you want to provide these, or prescribe something else that may be more affordable? Please advise thanks!

## 2013-09-14 NOTE — Telephone Encounter (Signed)
Go back to spiriva and symbicort 80/4.5: can take samples from here and give script to [price it out. IF this is also expensive, then have him let us know and I will try to order nebs duoneb

## 2013-09-14 NOTE — Patient Instructions (Addendum)
Testosterone injection What is this medicine? TESTOSTERONE (tes TOS ter one) is the main male hormone. It supports normal male development such as muscle growth, facial hair, and deep voice. It is used in males to treat low testosterone levels. This medicine may be used for other purposes; ask your health care provider or pharmacist if you have questions. COMMON BRAND NAME(S): Andro-L.A., Aveed, Delatestryl, Depo-Testosterone, Virilon What should I tell my health care provider before I take this medicine? They need to know if you have any of these conditions: -breast cancer -diabetes -heart disease -kidney disease -liver disease -lung disease -prostate cancer, enlargement -an unusual or allergic reaction to testosterone, other medicines, foods, dyes, or preservatives -pregnant or trying to get pregnant -breast-feeding How should I use this medicine? This medicine is for injection into a muscle. It is usually given by a health care professional in a hospital or clinic setting. Contact your pediatrician regarding the use of this medicine in children. While this medicine may be prescribed for children as young as 32 years of age for selected conditions, precautions do apply. Overdosage: If you think you have taken too much of this medicine contact a poison control center or emergency room at once. NOTE: This medicine is only for you. Do not share this medicine with others. What if I miss a dose? Try not to miss a dose. Your doctor or health care professional will tell you when your next injection is due. Notify the office if you are unable to keep an appointment. What may interact with this medicine? -medicines for diabetes -medicines that treat or prevent blood clots like warfarin -oxyphenbutazone -propranolol -steroid medicines like prednisone or cortisone This list may not describe all possible interactions. Give your health care provider a list of all the medicines, herbs,  non-prescription drugs, or dietary supplements you use. Also tell them if you smoke, drink alcohol, or use illegal drugs. Some items may interact with your medicine. What should I watch for while using this medicine? Visit your doctor or health care professional for regular checks on your progress. They will need to check the level of testosterone in your blood. This medicine may affect blood sugar levels. If you have diabetes, check with your doctor or health care professional before you change your diet or the dose of your diabetic medicine. This drug is banned from use in athletes by most athletic organizations. What side effects may I notice from receiving this medicine? Side effects that you should report to your doctor or health care professional as soon as possible: -allergic reactions like skin rash, itching or hives, swelling of the face, lips, or tongue -breast enlargement -breathing problems -changes in mood, especially anger, depression, or rage -dark urine -general ill feeling or flu-like symptoms -light-colored stools -loss of appetite, nausea -nausea, vomiting -right upper belly pain -stomach pain -swelling of ankles -too frequent or persistent erections -trouble passing urine or change in the amount of urine -unusually weak or tired -yellowing of the eyes or skin Additional side effects that can occur in women include: -deep or hoarse voice -facial hair growth -irregular menstrual periods Side effects that usually do not require medical attention (report to your doctor or health care professional if they continue or are bothersome): -acne -change in sex drive or performance -hair loss -headache This list may not describe all possible side effects. Call your doctor for medical advice about side effects. You may report side effects to FDA at 1-800-FDA-1088. Where should I keep my medicine? Keep  out of the reach of children. This medicine can be abused. Keep your  medicine in a safe place to protect it from theft. Do not share this medicine with anyone. Selling or giving away this medicine is dangerous and against the law. Store at room temperature between 20 and 25 degrees C (68 and 77 degrees F). Do not freeze. Protect from light. Follow the directions for the product you are prescribed. Throw away any unused medicine after the expiration date. NOTE: This sheet is a summary. It may not cover all possible information. If you have questions about this medicine, talk to your doctor, pharmacist, or health care provider.  2014, Elsevier/Gold Standard. (2007-09-09 16:13:46) Ferumoxytol injection What is this medicine? FERUMOXYTOL is an iron complex. Iron is used to make healthy red blood cells, which carry oxygen and nutrients throughout the body. This medicine is used to treat iron deficiency anemia in people with chronic kidney disease. This medicine may be used for other purposes; ask your health care provider or pharmacist if you have questions. COMMON BRAND NAME(S): Feraheme  What should I tell my health care provider before I take this medicine? They need to know if you have any of these conditions: -anemia not caused by low iron levels -high levels of iron in the blood -magnetic resonance imaging (MRI) test scheduled -an unusual or allergic reaction to iron, other medicines, foods, dyes, or preservatives -pregnant or trying to get pregnant -breast-feeding How should I use this medicine? This medicine is for injection into a vein. It is given by a health care professional in a hospital or clinic setting. Talk to your pediatrician regarding the use of this medicine in children. Special care may be needed. Overdosage: If you think you've taken too much of this medicine contact a poison control center or emergency room at once. Overdosage: If you think you have taken too much of this medicine contact a poison control center or emergency room at  once. NOTE: This medicine is only for you. Do not share this medicine with others. What if I miss a dose? It is important not to miss your dose. Call your doctor or health care professional if you are unable to keep an appointment. What may interact with this medicine? This medicine may interact with the following medications: -other iron products This list may not describe all possible interactions. Give your health care provider a list of all the medicines, herbs, non-prescription drugs, or dietary supplements you use. Also tell them if you smoke, drink alcohol, or use illegal drugs. Some items may interact with your medicine. What should I watch for while using this medicine? Visit your doctor or healthcare professional regularly. Tell your doctor or healthcare professional if your symptoms do not start to get better or if they get worse. You may need blood work done while you are taking this medicine. You may need to follow a special diet. Talk to your doctor. Foods that contain iron include: whole grains/cereals, dried fruits, beans, or peas, leafy green vegetables, and organ meats (liver, kidney). What side effects may I notice from receiving this medicine? Side effects that you should report to your doctor or health care professional as soon as possible: -allergic reactions like skin rash, itching or hives, swelling of the face, lips, or tongue -breathing problems -changes in blood pressure -feeling faint or lightheaded, falls -fever or chills -flushing, sweating, or hot feelings -swelling of the ankles or feet Side effects that usually do not require medical attention (Report   Report these to your doctor or health care professional if they continue or are bothersome.): -diarrhea -headache -nausea, vomiting -stomach pain This list may not describe all possible side effects. Call your doctor for medical advice about side effects. You may report side effects to FDA at 1-800-FDA-1088. Where  should I keep my medicine? This drug is given in a hospital or clinic and will not be stored at home. NOTE: This sheet is a summary. It may not cover all possible information. If you have questions about this medicine, talk to your doctor, pharmacist, or health care provider.  2014, Elsevier/Gold Standard. (2012-02-12 15:23:36)

## 2013-09-14 NOTE — Telephone Encounter (Signed)
Sample of Symbicort 80 has been placed up front. Rx to be printed and signed by MR to give to patient to take to pharmacy. Patient needs to be shown how to use this new inhaler. Pt needs to take Rx to pharmacy if he responds well to sample.  Nothing further needed.

## 2013-09-15 ENCOUNTER — Telehealth: Payer: Self-pay | Admitting: Internal Medicine

## 2013-09-15 ENCOUNTER — Ambulatory Visit
Admission: RE | Admit: 2013-09-15 | Discharge: 2013-09-15 | Disposition: A | Discharge status: Home / Self Care | Payer: Medicare Other | Source: Ambulatory Visit | Attending: Gastroenterology | Admitting: Gastroenterology

## 2013-09-15 DIAGNOSIS — R932 Abnormal findings on diagnostic imaging of liver and biliary tract: Secondary | ICD-10-CM

## 2013-09-16 ENCOUNTER — Other Ambulatory Visit: Payer: Medicare Other

## 2013-09-19 ENCOUNTER — Ambulatory Visit
Admission: RE | Admit: 2013-09-19 | Discharge: 2013-09-19 | Disposition: A | Discharge status: Home / Self Care | Payer: Medicare Other | Source: Ambulatory Visit | Attending: Gastroenterology | Admitting: Gastroenterology

## 2013-09-19 MED ORDER — GADOBENATE DIMEGLUMINE 529 MG/ML IV SOLN
19.0000 mL | Freq: Once | INTRAVENOUS | Status: AC | PRN
  Administered 2013-09-19: 19 mL via INTRAVENOUS

## 2013-09-22 NOTE — Telephone Encounter (Signed)
I have the forms and have completed them. Will have MR sign rx on Monday when he is in office and send off information. Maben Bing, CMA

## 2013-09-25 MED ORDER — BUDESONIDE-FORMOTEROL FUMARATE 80-4.5 MCG/ACT IN AERO: 2.0000 | INHALATION_SPRAY | Freq: Two times a day (BID) | RESPIRATORY_TRACT | Status: DC

## 2013-09-25 MED ORDER — TIOTROPIUM BROMIDE MONOHYDRATE 18 MCG IN CAPS: 18.0000 ug | ORAL_CAPSULE | Freq: Every day | RESPIRATORY_TRACT | Status: DC

## 2013-09-25 NOTE — Telephone Encounter (Signed)
Forms completed, rx signed and everything faxed to [facility] and [place] and me for spiriva and symbicort. I have saved the paperwork at my desk in case of any issues. Original financial paperwork mailed back to the pt. [place] Bing, CMA

## 2013-10-02 ENCOUNTER — Other Ambulatory Visit (HOSPITAL_BASED_OUTPATIENT_CLINIC_OR_DEPARTMENT_OTHER): Payer: Medicare Other | Admitting: Lab

## 2013-10-02 ENCOUNTER — Ambulatory Visit (HOSPITAL_BASED_OUTPATIENT_CLINIC_OR_DEPARTMENT_OTHER): Payer: Medicare Other | Admitting: Hematology & Oncology

## 2013-10-02 ENCOUNTER — Ambulatory Visit (HOSPITAL_BASED_OUTPATIENT_CLINIC_OR_DEPARTMENT_OTHER): Payer: Medicare Other

## 2013-10-02 ENCOUNTER — Encounter: Payer: Self-pay | Admitting: Hematology & Oncology

## 2013-10-02 VITALS — BP 122/84 | HR 91 | Temp 97.7°F | Resp 18 | Ht 66.0 in | Wt 202.0 lb

## 2013-10-02 DIAGNOSIS — E349 Endocrine disorder, unspecified: Secondary | ICD-10-CM

## 2013-10-02 DIAGNOSIS — D509 Iron deficiency anemia, unspecified: Secondary | ICD-10-CM

## 2013-10-02 DIAGNOSIS — E291 Testicular hypofunction: Secondary | ICD-10-CM

## 2013-10-02 LAB — CBC WITH DIFFERENTIAL (CANCER CENTER ONLY)
BASO#: 0 10*3/uL (ref 0.0–0.2)
BASO%: 0.5 % (ref 0.0–2.0)
EOS%: 4.2 % (ref 0.0–7.0)
Eosinophils Absolute: 0.3 10*3/uL (ref 0.0–0.5)
HCT: 46.6 % (ref 38.7–49.9)
HGB: 15.3 g/dL (ref 13.0–17.1)
LYMPH#: 2.5 10*3/uL (ref 0.9–3.3)
LYMPH%: 34.2 % (ref 14.0–48.0)
MCH: 27.3 pg — ABNORMAL LOW (ref 28.0–33.4)
MCHC: 32.8 g/dL (ref 32.0–35.9)
MCV: 83 fL (ref 82–98)
MONO#: 0.4 10*3/uL (ref 0.1–0.9)
MONO%: 5.6 % (ref 0.0–13.0)
NEUT#: 4.1 10*3/uL (ref 1.5–6.5)
NEUT%: 55.5 % (ref 40.0–80.0)
PLATELETS: 274 10*3/uL (ref 145–400)
RBC: 5.6 10*6/uL (ref 4.20–5.70)
RDW: 21.9 % — ABNORMAL HIGH (ref 11.1–15.7)
WBC: 7.3 10*3/uL (ref 4.0–10.0)

## 2013-10-02 LAB — TESTOSTERONE: Testosterone: 310 ng/dL (ref 300–890)

## 2013-10-02 LAB — CHCC SATELLITE - SMEAR

## 2013-10-02 MED ORDER — TESTOSTERONE CYPIONATE 200 MG/ML IM SOLN
400.0000 mg | Freq: Once | INTRAMUSCULAR | Status: AC
  Administered 2013-10-02: 400 mg via INTRAMUSCULAR

## 2013-10-02 MED ORDER — TESTOSTERONE CYPIONATE 200 MG/ML IM SOLN
INTRAMUSCULAR | Status: AC
  Filled 2013-10-02: qty 2

## 2013-10-02 NOTE — Patient Instructions (Signed)
Testosterone injection What is this medicine? TESTOSTERONE (tes TOS ter one) is the main male hormone. It supports normal male development such as muscle growth, facial hair, and deep voice. It is used in males to treat low testosterone levels. This medicine may be used for other purposes; ask your health care provider or pharmacist if you have questions. COMMON BRAND NAME(S): Andro-L.A., Aveed, Delatestryl, Depo-Testosterone, Virilon What should I tell my health care provider before I take this medicine? They need to know if you have any of these conditions: -breast cancer -diabetes -heart disease -kidney disease -liver disease -lung disease -prostate cancer, enlargement -an unusual or allergic reaction to testosterone, other medicines, foods, dyes, or preservatives -pregnant or trying to get pregnant -breast-feeding How should I use this medicine? This medicine is for injection into a muscle. It is usually given by a health care professional in a hospital or clinic setting. Contact your pediatrician regarding the use of this medicine in children. While this medicine may be prescribed for children as young as 32 years of age for selected conditions, precautions do apply. Overdosage: If you think you have taken too much of this medicine contact a poison control center or emergency room at once. NOTE: This medicine is only for you. Do not share this medicine with others. What if I miss a dose? Try not to miss a dose. Your doctor or health care professional will tell you when your next injection is due. Notify the office if you are unable to keep an appointment. What may interact with this medicine? -medicines for diabetes -medicines that treat or prevent blood clots like warfarin -oxyphenbutazone -propranolol -steroid medicines like prednisone or cortisone This list may not describe all possible interactions. Give your health care provider a list of all the medicines, herbs,  non-prescription drugs, or dietary supplements you use. Also tell them if you smoke, drink alcohol, or use illegal drugs. Some items may interact with your medicine. What should I watch for while using this medicine? Visit your doctor or health care professional for regular checks on your progress. They will need to check the level of testosterone in your blood. This medicine may affect blood sugar levels. If you have diabetes, check with your doctor or health care professional before you change your diet or the dose of your diabetic medicine. This drug is banned from use in athletes by most athletic organizations. What side effects may I notice from receiving this medicine? Side effects that you should report to your doctor or health care professional as soon as possible: -allergic reactions like skin rash, itching or hives, swelling of the face, lips, or tongue -breast enlargement -breathing problems -changes in mood, especially anger, depression, or rage -dark urine -general ill feeling or flu-like symptoms -light-colored stools -loss of appetite, nausea -nausea, vomiting -right upper belly pain -stomach pain -swelling of ankles -too frequent or persistent erections -trouble passing urine or change in the amount of urine -unusually weak or tired -yellowing of the eyes or skin Additional side effects that can occur in women include: -deep or hoarse voice -facial hair growth -irregular menstrual periods Side effects that usually do not require medical attention (report to your doctor or health care professional if they continue or are bothersome): -acne -change in sex drive or performance -hair loss -headache This list may not describe all possible side effects. Call your doctor for medical advice about side effects. You may report side effects to FDA at 1-800-FDA-1088. Where should I keep my medicine? Keep  out of the reach of children. This medicine can be abused. Keep your  medicine in a safe place to protect it from theft. Do not share this medicine with anyone. Selling or giving away this medicine is dangerous and against the law. Store at room temperature between 20 and 25 degrees C (68 and 77 degrees F). Do not freeze. Protect from light. Follow the directions for the product you are prescribed. Throw away any unused medicine after the expiration date. NOTE: This sheet is a summary. It may not cover all possible information. If you have questions about this medicine, talk to your doctor, pharmacist, or health care provider.  2014, Elsevier/Gold Standard. (2007-09-09 16:13:46)

## 2013-10-02 NOTE — Progress Notes (Signed)
Hematology and Oncology Follow Up Visit  James Zuniga [id number] [birth year] 58 y.o. 10/02/2013   Principle Diagnosis:   Iron deficiency anemia Hypo-testosteronemia Chronic liver lesions-undefined Current Therapy:    IV iron as indicated  Depo testosterone 400 mg IM every 4 weeks     Interim History:  [name] is back for followup. He is improving. He feel a little better. We last saw him, his testosterone level was very low. His level was 130. We went ahead and gave him some supplemental testosterone via intramuscular injection. He has felt better. He has a little bit more energy. He is he okay. He needs to watch his blood sugars because of diabetes. He has responded nicely to IV iron.we will see was on levels are.  When we saw him in February, his ferritin was up to 108 with an iron saturation of 17%. We did give him another dose of Feraheme at 1020 mg.  He still has some weakness. Again, this is improving. This they will be a chronic issue.  He says that he has gained some more functioning back in his genital region. This makes him satisfied. He feels his quality of life is getting better.  He's had no fever. He's had no cough. He's had no shortness of breath. He's had no bowel pain. There's been no change in bowel or bladder habits.  Medications: Current outpatient prescriptions:Aclidinium Bromide 400 MCG/ACT AEPB, Inhale 1 puff into the lungs 2 (two) times daily., Disp: 1 each, Rfl: 0;  allopurinol (ZYLOPRIM) 300 MG tablet, Take 300 mg by mouth daily., Disp: , Rfl: ;  amLODipine (NORVASC) 5 MG tablet, Take 5 mg by mouth daily., Disp: , Rfl: ;  aspirin 81 MG EC tablet, Take 81 mg by mouth daily. Swallow whole., Disp: , Rfl:  budesonide-formoterol (SYMBICORT) 80-4.5 MCG/ACT inhaler, Inhale 2 puffs into the lungs 2 (two) times daily., Disp: 3 Inhaler, Rfl: 3;  Fluticasone Furoate-Vilanterol (BREO ELLIPTA) 100-25 MCG/INH AEPB, Inhale 2 puffs into the lungs 2 (two) times daily., Disp: ,  Rfl: ;  furosemide (LASIX) 20 MG tablet, Take 1 tablet by mouth 2 (two) times daily., Disp: , Rfl:  gabapentin (NEURONTIN) 300 MG capsule, Take 300 mg by mouth 3 (three) times daily. , Disp: , Rfl: ;  losartan (COZAAR) 50 MG tablet, , Disp: , Rfl: ;  metFORMIN (GLUCOPHAGE) 500 MG tablet, Take 500 mg by mouth 2 (two) times daily with a meal., Disp: , Rfl: ;  metoprolol tartrate (LOPRESSOR) 25 MG tablet, , Disp: , Rfl: ;  olmesartan (BENICAR) 20 MG tablet, Take 20 mg by mouth daily., Disp: , Rfl:  potassium chloride (K-DUR,KLOR-CON) 10 MEQ tablet, Take 10 mEq by mouth 2 (two) times daily. , Disp: , Rfl: ;  simvastatin (ZOCOR) 20 MG tablet, Take 1 tablet by mouth Daily., Disp: , Rfl: ;  tadalafil (CIALIS) 20 MG tablet, Take 1 tablet (20 mg total) by mouth daily as needed for erectile dysfunction., Disp: 10 tablet, Rfl: 0 tiotropium (SPIRIVA) 18 MCG inhalation capsule, Place 1 capsule (18 mcg total) into inhaler and inhale daily., Disp: 90 capsule, Rfl: 3;  tiZANidine (ZANAFLEX) 2 MG tablet, Take 1 tablet by mouth 2 (two) times daily as needed for muscle spasms. , Disp: , Rfl: ;  traMADol (ULTRAM) 50 MG tablet, , Disp: , Rfl: ;  zolpidem (AMBIEN) 5 MG tablet, Take 5 mg by mouth at bedtime., Disp: , Rfl:   Allergies: No Known Allergies  Past Medical History, Surgical history, Social  history, and Family History were reviewed and updated.  Review of Systems: As above  Physical Exam:  height is 5\' 6"  (1.676 m) and weight is 202 lb (91.627 kg). His oral temperature is 97.7 F (36.5 C). His blood pressure is 122/84 and his pulse is 91. His respiration is 18.   We'll go well-nourished gentleman. His lungs are clear. Abdomen is soft. He has good bowel sounds. There is no fluid wave. He has no palpable liver or spleen. Cardiac exam is regular in rhythm. Extremities shows some weakness that is chronic in both legs. Has no swelling. Skin with no rashes. Head and neck exam shows no adenopathy. Has no oral lesions.  Neurological exam is unremarkable.  Lab Results  Component Value Date   WBC 7.3 10/02/2013   HGB 15.3 10/02/2013   HCT 46.6 10/02/2013   MCV 83 10/02/2013   PLT 274 10/02/2013     Chemistry      Component Value Date/Time   NA 144 08/04/2013 1028   K 3.5 08/04/2013 1028   CL 107 08/04/2013 1028   CO2 26 08/04/2013 1028   BUN 8 08/04/2013 1028   CREATININE 0.88 08/04/2013 1028      Component Value Date/Time   CALCIUM 8.7 08/04/2013 1028   ALKPHOS 79 08/04/2013 1028   AST 21 08/04/2013 1028   ALT 17 08/04/2013 1028   BILITOT 0.3 08/04/2013 1028      He did have an MRI of his liver. 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 It does not look like there is any malignant or solid tumors. There is no cirrhosis. 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 It was recommended that a followup MRI be done in 3-6 months.   Impression and Plan: James Zuniga is a 58 year old gentleman. He has iron deficiency anemia. He has multiple medical problems. He is on a lot of medications. He has a colonoscopy within the past year.  We will see what his levels show. Is possible he may need another dose of IV iron.  We will give him testosterone today. Again, we will see what his levels are. We may need to keep him on an every 4 week injections for right now.  I will see him back in 2 more months.   Volanda Napoleon, MD 3/23/20154:41 PM

## 2013-10-03 LAB — IRON AND TIBC CHCC
%SAT: 22 % (ref 20–55)
IRON: 63 ug/dL (ref 42–163)
TIBC: 284 ug/dL (ref 202–409)
UIBC: 220 ug/dL (ref 117–376)

## 2013-10-03 LAB — FERRITIN CHCC: Ferritin: 270 ng/ml (ref 22–316)

## 2013-10-05 ENCOUNTER — Other Ambulatory Visit: Payer: Self-pay | Admitting: *Deleted

## 2013-10-05 ENCOUNTER — Telehealth: Payer: Self-pay | Admitting: Hematology & Oncology

## 2013-10-05 ENCOUNTER — Telehealth: Payer: Self-pay | Admitting: *Deleted

## 2013-10-05 DIAGNOSIS — D509 Iron deficiency anemia, unspecified: Secondary | ICD-10-CM

## 2013-10-05 NOTE — Telephone Encounter (Addendum)
Message copied by Rico Ala on Thu Oct 05, 2013  1:40 PM ------      Message from: Burney Gauze R      Created: Wed Oct 04, 2013  6:41 PM       Please call and let him know that the iron is better but still a little. He needs Feraheme 510 mg IV x1 dose. Please set this up in one or 2 weeks. Thanks. James Zuniga ------  Spoke with pt via phone per Dr Ginette Pitman note. Transferred to scheduler for appt. dph

## 2013-10-05 NOTE — Telephone Encounter (Signed)
Pt called left message wanting to schedule iron infusion. No voice mail or answer on home phone. Left message on contact phone to call.

## 2013-10-06 ENCOUNTER — Telehealth: Payer: Self-pay | Admitting: Hematology & Oncology

## 2013-10-06 NOTE — Telephone Encounter (Signed)
Pt aware of 4-1 iron infusion

## 2013-10-09 ENCOUNTER — Telehealth: Payer: Self-pay | Admitting: Internal Medicine

## 2013-10-09 NOTE — Telephone Encounter (Signed)
Pt has returned call. °

## 2013-10-09 NOTE — Telephone Encounter (Signed)
Spoke with the pt and he is calling in regards to his new enrollment in patient assistance.  I called [facility] and spoke with Mitzi Hansen. He states that they have not received any forms or prescription on the pt. Advised him that everything was faxed to them on 09/25/13 per [facility]. He reports that Mondays are busy days for them and some times faxes don't come through. Mitzi Hansen asks that we refax the forms and prescription.  Attempted to call pt to let him know about this but I didn't get an answer.  Will route to [place] to follow up on.

## 2013-10-09 NOTE — Telephone Encounter (Signed)
Pt aware. I will hold message in my box to f/u on. [place] Bing, CMA

## 2013-10-10 ENCOUNTER — Emergency Department (HOSPITAL_COMMUNITY)
Admission: EM | Admit: 2013-10-10 | Discharge: 2013-10-10 | Disposition: A | Discharge status: Home / Self Care | Payer: Worker's Compensation | Attending: Emergency Medicine | Admitting: Emergency Medicine

## 2013-10-10 ENCOUNTER — Encounter (HOSPITAL_COMMUNITY): Payer: Self-pay | Admitting: Emergency Medicine

## 2013-10-10 DIAGNOSIS — I509 Heart failure, unspecified: Secondary | ICD-10-CM | POA: Insufficient documentation

## 2013-10-10 DIAGNOSIS — E119 Type 2 diabetes mellitus without complications: Secondary | ICD-10-CM | POA: Insufficient documentation

## 2013-10-10 DIAGNOSIS — F17200 Nicotine dependence, unspecified, uncomplicated: Secondary | ICD-10-CM | POA: Insufficient documentation

## 2013-10-10 DIAGNOSIS — Z7982 Long term (current) use of aspirin: Secondary | ICD-10-CM | POA: Insufficient documentation

## 2013-10-10 DIAGNOSIS — I2510 Atherosclerotic heart disease of native coronary artery without angina pectoris: Secondary | ICD-10-CM | POA: Insufficient documentation

## 2013-10-10 DIAGNOSIS — E291 Testicular hypofunction: Secondary | ICD-10-CM | POA: Insufficient documentation

## 2013-10-10 DIAGNOSIS — R209 Unspecified disturbances of skin sensation: Secondary | ICD-10-CM | POA: Insufficient documentation

## 2013-10-10 DIAGNOSIS — E785 Hyperlipidemia, unspecified: Secondary | ICD-10-CM | POA: Insufficient documentation

## 2013-10-10 DIAGNOSIS — Z79899 Other long term (current) drug therapy: Secondary | ICD-10-CM | POA: Insufficient documentation

## 2013-10-10 DIAGNOSIS — N289 Disorder of kidney and ureter, unspecified: Secondary | ICD-10-CM | POA: Insufficient documentation

## 2013-10-10 DIAGNOSIS — IMO0002 Reserved for concepts with insufficient information to code with codable children: Secondary | ICD-10-CM | POA: Insufficient documentation

## 2013-10-10 DIAGNOSIS — M549 Dorsalgia, unspecified: Secondary | ICD-10-CM

## 2013-10-10 DIAGNOSIS — I129 Hypertensive chronic kidney disease with stage 1 through stage 4 chronic kidney disease, or unspecified chronic kidney disease: Secondary | ICD-10-CM | POA: Insufficient documentation

## 2013-10-10 DIAGNOSIS — G8929 Other chronic pain: Secondary | ICD-10-CM | POA: Insufficient documentation

## 2013-10-10 MED ORDER — OXYCODONE-ACETAMINOPHEN 5-325 MG PO TABS
2.0000 | ORAL_TABLET | Freq: Once | ORAL | Status: AC
  Administered 2013-10-10: 2 via ORAL
  Filled 2013-10-10: qty 2

## 2013-10-10 MED ORDER — OXYCODONE-ACETAMINOPHEN 5-325 MG PO TABS: ORAL_TABLET | ORAL | Status: DC

## 2013-10-10 MED ORDER — IBUPROFEN 400 MG PO TABS
800.0000 mg | ORAL_TABLET | Freq: Once | ORAL | Status: AC
  Administered 2013-10-10: 800 mg via ORAL
  Filled 2013-10-10: qty 2

## 2013-10-10 NOTE — ED Notes (Signed)
Pt. Reports 20 year hx of lower back pain, but the pain intensified last Thursday. Pt reports that he used to take tramidol but it did not work. Pt denies any acute injury, and is unaware of why the pain had acute onset on Thursday.

## 2013-10-10 NOTE — ED Provider Notes (Signed)
CSN: [id number]     Arrival date & time 10/10/13  1747 History  This chart was scribed for non-physician practitioner Noland Fordyce, PA-C working with Osvaldo Shipper, MD by Anastasia Pall, ED scribe. This patient was seen in room TR10C/TR10C and the patient's care was started at 7:09 PM.   Chief Complaint  Patient presents with  . Back Pain   (Consider location/radiation/quality/duration/timing/severity/associated sxs/prior Treatment) The history is provided by the patient. No language interpreter was used.   HPI Comments: James Zuniga is a 58 y.o. male with h/o back pain 20 years ago, who presents to the Emergency Department complaining of gradually worsened, sharp, tingling, lower back pain, onset 5 days ago. He denies a trigger for his worsened back pain, and denies recent injuries and falls. He states that yesterday he was unable to ambulate secondary to pain. He reports numbness and tingling in his bilateral LE at baseline secondary to his back pain, but states his numbness and tingling have gotten worse over the last 5 days. He states he was going to pain management for his h/o back pain, received injections but stopped going 5 months ago. He denies specific reason he stopped going to pain management but states he plans on making another appointment due to recent exacerbation of his back pain. He has been taking Tramadol without relief. He states that Percocet is the only thing that has helped relieve his pain when it persists like this. He denies nausea, vomiting, change in bowel or bladder, weight loss, or any other associated symptoms. He denies allergies to medications.   PCP - Berkley Harvey, NP  Past Medical History  Diagnosis Date  . CHF (congestive heart failure)   . Coronary artery disease   . Diabetes mellitus   . Hypertension   . Renal disorder   . Hyperlipidemia   . Iron deficiency anemia, unspecified 07/01/2013  . Hypotestosteronism 07/01/2013   Past Surgical History   Procedure Laterality Date  . No past surgeries     Family History  Problem Relation Age of Onset  . Heart disease Mother   . Stroke Brother   . Diabetes Father    History  Substance Use Topics  . Smoking status: Current Every Day Smoker -- 1.00 packs/day for 30 years    Types: Cigarettes    Start date: 08/05/1983  . Smokeless tobacco: Never Used     Comment: still smoking everyday  10-02-13  . Alcohol Use: No    Review of Systems  Musculoskeletal: Positive for back pain.  Skin: Negative for wound.  Neurological: Positive for numbness (and tingling to bilateral LE). Negative for syncope.   Allergies  Review of patient's allergies indicates no known allergies.  Home Medications   Current Outpatient Rx  Name  Route  Sig  Dispense  Refill  . Aclidinium Bromide 400 MCG/ACT AEPB   Inhalation   Inhale 1 puff into the lungs 2 (two) times daily.   1 each   0   . allopurinol (ZYLOPRIM) 300 MG tablet   Oral   Take 300 mg by mouth daily.         Marland Kitchen amLODipine (NORVASC) 5 MG tablet   Oral   Take 5 mg by mouth daily.         Marland Kitchen aspirin 81 MG EC tablet   Oral   Take 81 mg by mouth daily. Swallow whole.         . budesonide-formoterol (SYMBICORT) 80-4.5 MCG/ACT inhaler  Inhalation   Inhale 2 puffs into the lungs 2 (two) times daily.   3 Inhaler   3   . Fluticasone Furoate-Vilanterol (BREO ELLIPTA) 100-25 MCG/INH AEPB   Inhalation   Inhale 2 puffs into the lungs 2 (two) times daily.         . furosemide (LASIX) 20 MG tablet   Oral   Take 1 tablet by mouth 2 (two) times daily.         Marland Kitchen gabapentin (NEURONTIN) 300 MG capsule   Oral   Take 300 mg by mouth 3 (three) times daily.          Marland Kitchen losartan (COZAAR) 50 MG tablet   Oral   Take 50 mg by mouth daily.          . metFORMIN (GLUCOPHAGE) 500 MG tablet   Oral   Take 500 mg by mouth 2 (two) times daily with a meal.         . metoprolol tartrate (LOPRESSOR) 25 MG tablet   Oral   Take 25 mg by  mouth daily.          Marland Kitchen olmesartan (BENICAR) 20 MG tablet   Oral   Take 20 mg by mouth daily.         . potassium chloride (K-DUR,KLOR-CON) 10 MEQ tablet   Oral   Take 10 mEq by mouth 2 (two) times daily.          . simvastatin (ZOCOR) 20 MG tablet   Oral   Take 1 tablet by mouth Daily.         . tadalafil (CIALIS) 20 MG tablet   Oral   Take 1 tablet (20 mg total) by mouth daily as needed for erectile dysfunction.   10 tablet   0   . tiotropium (SPIRIVA) 18 MCG inhalation capsule   Inhalation   Place 1 capsule (18 mcg total) into inhaler and inhale daily.   90 capsule   3   . tiZANidine (ZANAFLEX) 2 MG tablet   Oral   Take 1 tablet by mouth 2 (two) times daily as needed for muscle spasms.          . traMADol (ULTRAM) 50 MG tablet   Oral   Take 50 mg by mouth daily as needed for moderate pain.          Marland Kitchen zolpidem (AMBIEN) 5 MG tablet   Oral   Take 5 mg by mouth at bedtime.         Marland Kitchen oxyCODONE-acetaminophen (PERCOCET/ROXICET) 5-325 MG per tablet      Take 1-2 pills every 4-6 hours as needed for pain.   15 tablet   0    BP 134/95  Pulse 100  Temp(Src) 99.3 F (37.4 C) (Oral)  Resp 18  Ht 5\' 6"  (1.676 m)  Wt 189 lb (85.73 kg)  BMI 30.52 kg/m2  SpO2 100%  Physical Exam  Nursing note and vitals reviewed. Constitutional: He appears well-developed and well-nourished.  HENT:  Head: Normocephalic and atraumatic.  Eyes: Conjunctivae are normal. No scleral icterus.  Neck: Normal range of motion.  Cardiovascular: Normal rate and regular rhythm.   Pulmonary/Chest: Effort normal and breath sounds normal. No respiratory distress. He has no wheezes. He has no rales. He exhibits no tenderness.  Musculoskeletal: Normal range of motion.       Cervical back: Normal.       Thoracic back: Normal.       Lumbar back: He exhibits tenderness.  He exhibits no bony tenderness and no deformity.  Tenderness over lumbar musculature and lower lumbar spinous process w/o  step offs, or crepitus. FROM all extremities w/o ataxia.   Neurological: He is alert. Gait normal.  Antalgic gait. Uses cane for assistance.   Skin: Skin is warm and dry.   ED Course  Procedures (including critical care time)  DIAGNOSTIC STUDIES: Oxygen Saturation is 100% on room air, normal by my interpretation.    COORDINATION OF CARE: 7:14 PM-Discussed treatment plan which includes pain medication and muscle relaxant with pt at bedside and pt agreed to plan.  Labs Review Labs Reviewed - No data to display Imaging Review No results found.   EKG Interpretation None     Medications  oxyCODONE-acetaminophen (PERCOCET/ROXICET) 5-325 MG per tablet 2 tablet (2 tablets Oral Given 10/10/13 1927)  ibuprofen (ADVIL,MOTRIN) tablet 800 mg (800 mg Oral Given 10/10/13 1927)   MDM   Final diagnoses:  Chronic back pain    Pt is a 58yo male c/o exacerbation of his chronic back pain w/o falls or other known trauma. No red flag symptoms. Not concerned for emergent process taking place at this time. Do not believe further workup needed at this time. Pt has been out of pain management for 60mo, did not specify while. Pt asked if cortisone injection could be given in ED.  Discussed with pt this cannot be done, however will give PO pain medication and advised to schedule appointment with PCP and pain management for chronic pain. Return precautions provided. Pt verbalized understanding and agreement with tx plan.   I personally performed the services described in this documentation, which was scribed in my presence. The recorded information has been reviewed and is accurate.    Noland Fordyce, PA-C 10/11/13 [phone]

## 2013-10-11 ENCOUNTER — Ambulatory Visit (HOSPITAL_BASED_OUTPATIENT_CLINIC_OR_DEPARTMENT_OTHER): Payer: Medicare Other

## 2013-10-11 VITALS — BP 135/90 | HR 70 | Temp 97.0°F | Resp 18

## 2013-10-11 DIAGNOSIS — D509 Iron deficiency anemia, unspecified: Secondary | ICD-10-CM

## 2013-10-11 MED ORDER — FERUMOXYTOL INJECTION 510 MG/17 ML: 510.0000 mg | Freq: Once | INTRAVENOUS | Status: DC

## 2013-10-11 MED ORDER — SODIUM CHLORIDE 0.9 % IV SOLN
Freq: Once | INTRAVENOUS | Status: AC
  Administered 2013-10-11: 10:00:00 via INTRAVENOUS

## 2013-10-11 MED ORDER — SODIUM CHLORIDE 0.9 % IV SOLN
510.0000 mg | Freq: Once | INTRAVENOUS | Status: AC
  Administered 2013-10-11: 510 mg via INTRAVENOUS
  Filled 2013-10-11: qty 17

## 2013-10-11 NOTE — Patient Instructions (Signed)

## 2013-10-12 NOTE — ED Provider Notes (Signed)
Medical screening examination/treatment/procedure(s) were performed by non-physician practitioner and as supervising physician I was immediately available for consultation/collaboration.   EKG Interpretation None        Osvaldo Shipper, MD 10/12/13 [phone]

## 2013-10-12 NOTE — Telephone Encounter (Signed)
I called [phone]) to check on status of application that was re-faxed on 10-09-13. Pt has been enrolled until 09-2014 and medication is in process of being shipped now. [place], CMA

## 2013-10-23 ENCOUNTER — Telehealth: Payer: Self-pay | Admitting: Internal Medicine

## 2013-10-23 MED ORDER — BUDESONIDE-FORMOTEROL FUMARATE 80-4.5 MCG/ACT IN AERO: 2.0000 | INHALATION_SPRAY | Freq: Two times a day (BID) | RESPIRATORY_TRACT | Status: DC

## 2013-10-23 NOTE — Telephone Encounter (Signed)
Spoke with pt. Made aware symbicort is 2 puffs bid. He also reports [facility] needs clarification on his RX's as well. Called [phone]. Was advised the application has pt on sym 160 2 puffs BID and they have 2 rx's on file for sym 80 and 160.  They are needing new RX faxed to them. I have printed this off and will forward to [place] to document once signed and faxed

## 2013-10-24 NOTE — Telephone Encounter (Signed)
Prescription faxed to [facility] and [place], CMA

## 2013-10-30 ENCOUNTER — Ambulatory Visit (HOSPITAL_BASED_OUTPATIENT_CLINIC_OR_DEPARTMENT_OTHER): Payer: Medicare Other

## 2013-10-30 ENCOUNTER — Ambulatory Visit: Payer: Medicare Other | Admitting: Hematology & Oncology

## 2013-10-30 ENCOUNTER — Other Ambulatory Visit: Payer: Medicare Other | Admitting: Lab

## 2013-10-30 VITALS — BP 124/84 | HR 70 | Temp 98.1°F | Resp 16 | Ht 66.0 in | Wt 199.0 lb

## 2013-10-30 DIAGNOSIS — E291 Testicular hypofunction: Secondary | ICD-10-CM

## 2013-10-30 DIAGNOSIS — D509 Iron deficiency anemia, unspecified: Secondary | ICD-10-CM

## 2013-10-30 DIAGNOSIS — E349 Endocrine disorder, unspecified: Secondary | ICD-10-CM

## 2013-10-30 MED ORDER — TESTOSTERONE CYPIONATE 200 MG/ML IM SOLN
INTRAMUSCULAR | Status: AC
  Filled 2013-10-30: qty 2

## 2013-10-30 MED ORDER — SODIUM CHLORIDE 0.9 % IV SOLN
510.0000 mg | Freq: Once | INTRAVENOUS | Status: AC
  Administered 2013-10-30: 510 mg via INTRAVENOUS
  Filled 2013-10-30: qty 17

## 2013-10-30 MED ORDER — SODIUM CHLORIDE 0.9 % IV SOLN
INTRAVENOUS | Status: DC
  Administered 2013-10-30: 10:00:00 via INTRAVENOUS

## 2013-10-30 MED ORDER — TESTOSTERONE CYPIONATE 200 MG/ML IM SOLN
400.0000 mg | Freq: Once | INTRAMUSCULAR | Status: AC
  Administered 2013-10-30: 400 mg via INTRAMUSCULAR

## 2013-10-30 NOTE — Patient Instructions (Signed)
Testosterone injection What is this medicine? TESTOSTERONE (tes TOS ter one) is the main male hormone. It supports normal male development such as muscle growth, facial hair, and deep voice. It is used in males to treat low testosterone levels. This medicine may be used for other purposes; ask your health care provider or pharmacist if you have questions. COMMON BRAND NAME(S): Andro-L.A., Aveed, Delatestryl, Depo-Testosterone, Virilon What should I tell my health care provider before I take this medicine? They need to know if you have any of these conditions: -breast cancer -diabetes -heart disease -kidney disease -liver disease -lung disease -prostate cancer, enlargement -an unusual or allergic reaction to testosterone, other medicines, foods, dyes, or preservatives -pregnant or trying to get pregnant -breast-feeding How should I use this medicine? This medicine is for injection into a muscle. It is usually given by a health care professional in a hospital or clinic setting. Contact your pediatrician regarding the use of this medicine in children. While this medicine may be prescribed for children as young as 32 years of age for selected conditions, precautions do apply. Overdosage: If you think you have taken too much of this medicine contact a poison control center or emergency room at once. NOTE: This medicine is only for you. Do not share this medicine with others. What if I miss a dose? Try not to miss a dose. Your doctor or health care professional will tell you when your next injection is due. Notify the office if you are unable to keep an appointment. What may interact with this medicine? -medicines for diabetes -medicines that treat or prevent blood clots like warfarin -oxyphenbutazone -propranolol -steroid medicines like prednisone or cortisone This list may not describe all possible interactions. Give your health care provider a list of all the medicines, herbs,  non-prescription drugs, or dietary supplements you use. Also tell them if you smoke, drink alcohol, or use illegal drugs. Some items may interact with your medicine. What should I watch for while using this medicine? Visit your doctor or health care professional for regular checks on your progress. They will need to check the level of testosterone in your blood. This medicine may affect blood sugar levels. If you have diabetes, check with your doctor or health care professional before you change your diet or the dose of your diabetic medicine. This drug is banned from use in athletes by most athletic organizations. What side effects may I notice from receiving this medicine? Side effects that you should report to your doctor or health care professional as soon as possible: -allergic reactions like skin rash, itching or hives, swelling of the face, lips, or tongue -breast enlargement -breathing problems -changes in mood, especially anger, depression, or rage -dark urine -general ill feeling or flu-like symptoms -light-colored stools -loss of appetite, nausea -nausea, vomiting -right upper belly pain -stomach pain -swelling of ankles -too frequent or persistent erections -trouble passing urine or change in the amount of urine -unusually weak or tired -yellowing of the eyes or skin Additional side effects that can occur in women include: -deep or hoarse voice -facial hair growth -irregular menstrual periods Side effects that usually do not require medical attention (report to your doctor or health care professional if they continue or are bothersome): -acne -change in sex drive or performance -hair loss -headache This list may not describe all possible side effects. Call your doctor for medical advice about side effects. You may report side effects to FDA at 1-800-FDA-1088. Where should I keep my medicine? Keep  out of the reach of children. This medicine can be abused. Keep your  medicine in a safe place to protect it from theft. Do not share this medicine with anyone. Selling or giving away this medicine is dangerous and against the law. Store at room temperature between 20 and 25 degrees C (68 and 77 degrees F). Do not freeze. Protect from light. Follow the directions for the product you are prescribed. Throw away any unused medicine after the expiration date. NOTE: This sheet is a summary. It may not cover all possible information. If you have questions about this medicine, talk to your doctor, pharmacist, or health care provider.  2014, Elsevier/Gold Standard. (2007-09-09 16:13:46) Ferumoxytol injection What is this medicine? FERUMOXYTOL is an iron complex. Iron is used to make healthy red blood cells, which carry oxygen and nutrients throughout the body. This medicine is used to treat iron deficiency anemia in people with chronic kidney disease. This medicine may be used for other purposes; ask your health care provider or pharmacist if you have questions. COMMON BRAND NAME(S): Feraheme  What should I tell my health care provider before I take this medicine? They need to know if you have any of these conditions: -anemia not caused by low iron levels -high levels of iron in the blood -magnetic resonance imaging (MRI) test scheduled -an unusual or allergic reaction to iron, other medicines, foods, dyes, or preservatives -pregnant or trying to get pregnant -breast-feeding How should I use this medicine? This medicine is for injection into a vein. It is given by a health care professional in a hospital or clinic setting. Talk to your pediatrician regarding the use of this medicine in children. Special care may be needed. Overdosage: If you think you've taken too much of this medicine contact a poison control center or emergency room at once. Overdosage: If you think you have taken too much of this medicine contact a poison control center or emergency room at  once. NOTE: This medicine is only for you. Do not share this medicine with others. What if I miss a dose? It is important not to miss your dose. Call your doctor or health care professional if you are unable to keep an appointment. What may interact with this medicine? This medicine may interact with the following medications: -other iron products This list may not describe all possible interactions. Give your health care provider a list of all the medicines, herbs, non-prescription drugs, or dietary supplements you use. Also tell them if you smoke, drink alcohol, or use illegal drugs. Some items may interact with your medicine. What should I watch for while using this medicine? Visit your doctor or healthcare professional regularly. Tell your doctor or healthcare professional if your symptoms do not start to get better or if they get worse. You may need blood work done while you are taking this medicine. You may need to follow a special diet. Talk to your doctor. Foods that contain iron include: whole grains/cereals, dried fruits, beans, or peas, leafy green vegetables, and organ meats (liver, kidney). What side effects may I notice from receiving this medicine? Side effects that you should report to your doctor or health care professional as soon as possible: -allergic reactions like skin rash, itching or hives, swelling of the face, lips, or tongue -breathing problems -changes in blood pressure -feeling faint or lightheaded, falls -fever or chills -flushing, sweating, or hot feelings -swelling of the ankles or feet Side effects that usually do not require medical attention (Report  these to your doctor or health care professional if they continue or are bothersome.): -diarrhea -headache -nausea, vomiting -stomach pain This list may not describe all possible side effects. Call your doctor for medical advice about side effects. You may report side effects to FDA at 1-800-FDA-1088. Where  should I keep my medicine? This drug is given in a hospital or clinic and will not be stored at home. NOTE: This sheet is a summary. It may not cover all possible information. If you have questions about this medicine, talk to your doctor, pharmacist, or health care provider.  2014, Elsevier/Gold Standard. (2012-02-12 15:23:36)

## 2013-11-27 ENCOUNTER — Encounter: Payer: Self-pay | Admitting: Hematology & Oncology

## 2013-11-27 ENCOUNTER — Ambulatory Visit (HOSPITAL_BASED_OUTPATIENT_CLINIC_OR_DEPARTMENT_OTHER): Payer: Medicare Other

## 2013-11-27 ENCOUNTER — Other Ambulatory Visit (HOSPITAL_BASED_OUTPATIENT_CLINIC_OR_DEPARTMENT_OTHER): Payer: Medicare Other | Admitting: Lab

## 2013-11-27 ENCOUNTER — Ambulatory Visit (HOSPITAL_BASED_OUTPATIENT_CLINIC_OR_DEPARTMENT_OTHER): Payer: Medicare Other | Admitting: Hematology & Oncology

## 2013-11-27 VITALS — BP 127/85 | HR 78 | Temp 98.1°F | Resp 18 | Ht 68.0 in | Wt 196.0 lb

## 2013-11-27 DIAGNOSIS — E291 Testicular hypofunction: Secondary | ICD-10-CM

## 2013-11-27 DIAGNOSIS — E349 Endocrine disorder, unspecified: Secondary | ICD-10-CM

## 2013-11-27 DIAGNOSIS — D509 Iron deficiency anemia, unspecified: Secondary | ICD-10-CM

## 2013-11-27 DIAGNOSIS — M1A9XX0 Chronic gout, unspecified, without tophus (tophi): Secondary | ICD-10-CM

## 2013-11-27 DIAGNOSIS — E119 Type 2 diabetes mellitus without complications: Secondary | ICD-10-CM

## 2013-11-27 LAB — CBC WITH DIFFERENTIAL (CANCER CENTER ONLY)
BASO#: 0 10*3/uL (ref 0.0–0.2)
BASO%: 0.6 % (ref 0.0–2.0)
EOS%: 4.3 % (ref 0.0–7.0)
Eosinophils Absolute: 0.2 10*3/uL (ref 0.0–0.5)
HEMATOCRIT: 48.9 % (ref 38.7–49.9)
HEMOGLOBIN: 16.4 g/dL (ref 13.0–17.1)
LYMPH#: 1.6 10*3/uL (ref 0.9–3.3)
LYMPH%: 33.1 % (ref 14.0–48.0)
MCH: 29.4 pg (ref 28.0–33.4)
MCHC: 33.5 g/dL (ref 32.0–35.9)
MCV: 88 fL (ref 82–98)
MONO#: 0.3 10*3/uL (ref 0.1–0.9)
MONO%: 6 % (ref 0.0–13.0)
NEUT%: 56 % (ref 40.0–80.0)
NEUTROS ABS: 2.7 10*3/uL (ref 1.5–6.5)
Platelets: 252 10*3/uL (ref 145–400)
RBC: 5.57 10*6/uL (ref 4.20–5.70)
RDW: 15.3 % (ref 11.1–15.7)
WBC: 4.9 10*3/uL (ref 4.0–10.0)

## 2013-11-27 LAB — IRON AND TIBC CHCC
%SAT: 28 % (ref 20–55)
Iron: 78 ug/dL (ref 42–163)
TIBC: 276 ug/dL (ref 202–409)
UIBC: 199 ug/dL (ref 117–376)

## 2013-11-27 LAB — FERRITIN CHCC: Ferritin: 348 ng/ml — ABNORMAL HIGH (ref 22–316)

## 2013-11-27 LAB — TESTOSTERONE: Testosterone: 215 ng/dL — ABNORMAL LOW (ref 300–890)

## 2013-11-27 MED ORDER — TESTOSTERONE CYPIONATE 200 MG/ML IM SOLN
INTRAMUSCULAR | Status: AC
  Filled 2013-11-27: qty 2

## 2013-11-27 MED ORDER — TESTOSTERONE CYPIONATE 200 MG/ML IM SOLN
400.0000 mg | Freq: Once | INTRAMUSCULAR | Status: AC
  Administered 2013-11-27: 400 mg via INTRAMUSCULAR

## 2013-11-27 NOTE — Patient Instructions (Signed)
You Can Quit Smoking If you are ready to quit smoking or are thinking about it, congratulations! You have chosen to help yourself be healthier and live longer! There are lots of different ways to quit smoking. Nicotine gum, nicotine patches, a nicotine inhaler, or nicotine nasal spray can help with physical craving. Hypnosis, support groups, and medicines help break the habit of smoking. TIPS TO GET OFF AND STAY OFF CIGARETTES  Learn to predict your moods. Do not let a bad situation be your excuse to have a cigarette. Some situations in your life might tempt you to have a cigarette.  Ask friends and co-workers not to smoke around you.  Make your home smoke-free.  Never have "just one" cigarette. It leads to wanting another and another. Remind yourself of your decision to quit.  On a card, make a list of your reasons for not smoking. Read it at least the same number of times a day as you have a cigarette. Tell yourself everyday, "I do not want to smoke. I choose not to smoke."  Ask someone at home or work to help you with your plan to quit smoking.  Have something planned after you eat or have a cup of coffee. Take a walk or get other exercise to perk you up. This will help to keep you from overeating.  Try a relaxation exercise to calm you down and decrease your stress. Remember, you may be tense and nervous the first two weeks after you quit. This will pass.  Find new activities to keep your hands busy. Play with a pen, coin, or rubber band. Doodle or draw things on paper.  Brush your teeth right after eating. This will help cut down the craving for the taste of tobacco after meals. You can try mouthwash too.  Try gum, breath mints, or diet candy to keep something in your mouth. IF YOU SMOKE AND WANT TO QUIT:  Do not stock up on cigarettes. Never buy a carton. Wait until one pack is finished before you buy another.  Never carry cigarettes with you at work or at home.  Keep cigarettes  as far away from you as possible. Leave them with someone else.  Never carry matches or a lighter with you.  Ask yourself, "Do I need this cigarette or is this just a reflex?"  Bet with someone that you can quit. Put cigarette money in a piggy bank every morning. If you smoke, you give up the money. If you do not smoke, by the end of the week, you keep the money.  Keep trying. It takes 21 days to change a habit!  Talk to your doctor about using medicines to help you quit. These include nicotine replacement gum, lozenges, or skin patches. Document Released: 04/25/2009 Document Revised: 09/21/2011 Document Reviewed: 04/25/2009 Exit[employer] Patient Information 2014 [employer].  

## 2013-11-27 NOTE — Patient Instructions (Signed)
Testosterone injection What is this medicine? TESTOSTERONE (tes TOS ter one) is the main male hormone. It supports normal male development such as muscle growth, facial hair, and deep voice. It is used in males to treat low testosterone levels. This medicine may be used for other purposes; ask your health care provider or pharmacist if you have questions. COMMON BRAND NAME(S): Andro-L.A., Aveed, Delatestryl, Depo-Testosterone, Virilon What should I tell my health care provider before I take this medicine? They need to know if you have any of these conditions: -breast cancer -diabetes -heart disease -kidney disease -liver disease -lung disease -prostate cancer, enlargement -an unusual or allergic reaction to testosterone, other medicines, foods, dyes, or preservatives -pregnant or trying to get pregnant -breast-feeding How should I use this medicine? This medicine is for injection into a muscle. It is usually given by a health care professional in a hospital or clinic setting. Contact your pediatrician regarding the use of this medicine in children. While this medicine may be prescribed for children as young as 32 years of age for selected conditions, precautions do apply. Overdosage: If you think you have taken too much of this medicine contact a poison control center or emergency room at once. NOTE: This medicine is only for you. Do not share this medicine with others. What if I miss a dose? Try not to miss a dose. Your doctor or health care professional will tell you when your next injection is due. Notify the office if you are unable to keep an appointment. What may interact with this medicine? -medicines for diabetes -medicines that treat or prevent blood clots like warfarin -oxyphenbutazone -propranolol -steroid medicines like prednisone or cortisone This list may not describe all possible interactions. Give your health care provider a list of all the medicines, herbs,  non-prescription drugs, or dietary supplements you use. Also tell them if you smoke, drink alcohol, or use illegal drugs. Some items may interact with your medicine. What should I watch for while using this medicine? Visit your doctor or health care professional for regular checks on your progress. They will need to check the level of testosterone in your blood. This medicine may affect blood sugar levels. If you have diabetes, check with your doctor or health care professional before you change your diet or the dose of your diabetic medicine. This drug is banned from use in athletes by most athletic organizations. What side effects may I notice from receiving this medicine? Side effects that you should report to your doctor or health care professional as soon as possible: -allergic reactions like skin rash, itching or hives, swelling of the face, lips, or tongue -breast enlargement -breathing problems -changes in mood, especially anger, depression, or rage -dark urine -general ill feeling or flu-like symptoms -light-colored stools -loss of appetite, nausea -nausea, vomiting -right upper belly pain -stomach pain -swelling of ankles -too frequent or persistent erections -trouble passing urine or change in the amount of urine -unusually weak or tired -yellowing of the eyes or skin Additional side effects that can occur in women include: -deep or hoarse voice -facial hair growth -irregular menstrual periods Side effects that usually do not require medical attention (report to your doctor or health care professional if they continue or are bothersome): -acne -change in sex drive or performance -hair loss -headache This list may not describe all possible side effects. Call your doctor for medical advice about side effects. You may report side effects to FDA at 1-800-FDA-1088. Where should I keep my medicine? Keep  out of the reach of children. This medicine can be abused. Keep your  medicine in a safe place to protect it from theft. Do not share this medicine with anyone. Selling or giving away this medicine is dangerous and against the law. Store at room temperature between 20 and 25 degrees C (68 and 77 degrees F). Do not freeze. Protect from light. Follow the directions for the product you are prescribed. Throw away any unused medicine after the expiration date. NOTE: This sheet is a summary. It may not cover all possible information. If you have questions about this medicine, talk to your doctor, pharmacist, or health care provider.  2014, Elsevier/Gold Standard. (2007-09-09 16:13:46)

## 2013-11-28 NOTE — Progress Notes (Signed)
Hematology and Oncology Follow Up Visit  James Zuniga [id number] 04-12-1956 58 y.o. 11/28/2013   Principle Diagnosis:   Iron deficiency anemia  hypotestosteronemia  Chronic liver lesions    Current Therapy:    IV iron as indicated  Depot testosterone 400 mg IM Q3 weeks     Interim History:  [name] is is back for followup. He still is having a lot of issues. His anemia.. Has resolved. He got IV iron. Is on testosterone. These both have really helped improve his anemia.  He has multiple other problems. His diabetic. A much or how often he takes blood sugars. There has a paternal medication. I think somewhat his problems are in his medications. Hopefully he will go to his family doctor and get these sorted out.  He's had no bleeding. He's had no change in bowel or bladder habits. There is still has some weakness in the legs. 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 There is no cough or in his had no rashes.  Medications: Current outpatient prescriptions:Aclidinium Bromide 400 MCG/ACT AEPB, Inhale 1 puff into the lungs 2 (two) times daily., Disp: 1 each, Rfl: 0;  allopurinol (ZYLOPRIM) 300 MG tablet, Take 300 mg by mouth daily., Disp: , Rfl: ;  ALPRAZolam (XANAX) 0.5 MG tablet, , Disp: , Rfl: ;  amLODipine (NORVASC) 5 MG tablet, Take 5 mg by mouth daily., Disp: , Rfl: ;  aspirin 81 MG EC tablet, Take 81 mg by mouth daily. Swallow whole., Disp: , Rfl:  budesonide-formoterol (SYMBICORT) 80-4.5 MCG/ACT inhaler, Inhale 2 puffs into the lungs 2 (two) times daily., Disp: 3 Inhaler, Rfl: 3;  Fluticasone Furoate-Vilanterol (BREO ELLIPTA) 100-25 MCG/INH AEPB, Inhale 2 puffs into the lungs 2 (two) times daily., Disp: , Rfl: ;  furosemide (LASIX) 20 MG tablet, Take 1 tablet by mouth 2 (two) times daily., Disp: , Rfl:  gabapentin (NEURONTIN) 300 MG capsule, Take 300 mg by mouth 3 (three) times daily. , Disp: , Rfl: ;  losartan (COZAAR) 50 MG tablet, Take 50 mg by mouth daily. , Disp: , Rfl: ;   metFORMIN (GLUCOPHAGE) 500 MG tablet, Take 500 mg by mouth 2 (two) times daily with a meal., Disp: , Rfl: ;  metoprolol tartrate (LOPRESSOR) 25 MG tablet, Take 25 mg by mouth daily. , Disp: , Rfl:  NON FORMULARY, 11-27-13 pt states he will see Dr. Ronnald Ramp tomorrow and will take all his medication with him to review., Disp: , Rfl: ;  olmesartan (BENICAR) 20 MG tablet, Take 20 mg by mouth daily., Disp: , Rfl: ;  potassium chloride (K-DUR,KLOR-CON) 10 MEQ tablet, Take 10 mEq by mouth 2 (two) times daily. , Disp: , Rfl: ;  simvastatin (ZOCOR) 20 MG tablet, Take 1 tablet by mouth Daily., Disp: , Rfl:  tiotropium (SPIRIVA) 18 MCG inhalation capsule, Place 1 capsule (18 mcg total) into inhaler and inhale daily., Disp: 90 capsule, Rfl: 3;  traMADol (ULTRAM) 50 MG tablet, Take 50 mg by mouth daily as needed for moderate pain. , Disp: , Rfl: ;  zolpidem (AMBIEN) 5 MG tablet, Take 5 mg by mouth at bedtime., Disp: , Rfl: ;  predniSONE (DELTASONE) 10 MG tablet, Take 10 mg by mouth daily with breakfast. , Disp: , Rfl:  tadalafil (CIALIS) 20 MG tablet, Take 1 tablet (20 mg total) by mouth daily as needed for erectile dysfunction., Disp: 10 tablet, Rfl: 0;  tiZANidine (ZANAFLEX) 2 MG tablet, Take 1 tablet by mouth 2 (two) times daily as needed for muscle  spasms. , Disp: , Rfl:   Allergies: No Known Allergies  Past Medical History, Surgical history, Social history, and Family History were reviewed and updated.  Review of Systems: As above  Physical Exam:  height is 5\' 8"  (1.727 m) and weight is 196 lb (88.905 kg). His oral temperature is 98.1 F (36.7 C). His blood pressure is 127/85 and his pulse is 78. His respiration is 18.   Fairly well-developed and well-nourished African American gentleman. His head and neck exam shows no ocular or oral lesions. Has no palpable cervical or supraclavicular lymph nodes. Lungs are clear. Cardiac exam regular rate and rhythm. As a 1/6 systolic ejection murmur. Abdomen soft. There is  no palpable liver. Spleen tip is not palpable. Back exam no tenderness over the spine ribs or hips. Extremities shows no clubbing cyanosis or edema. He has some tenderness to palpation in his feet.. Skin exam no rashes. Neurological exam shows the neuropathic type changes in his legs.  Lab Results  Component Value Date   WBC 4.9 11/27/2013   HGB 16.4 11/27/2013   HCT 48.9 11/27/2013   MCV 88 11/27/2013   PLT 252 11/27/2013     Chemistry      Component Value Date/Time   NA 144 08/04/2013 1028   K 3.5 08/04/2013 1028   CL 107 08/04/2013 1028   CO2 26 08/04/2013 1028   BUN 8 08/04/2013 1028   CREATININE 0.88 08/04/2013 1028      Component Value Date/Time   CALCIUM 8.7 08/04/2013 1028   ALKPHOS 79 08/04/2013 1028   AST 21 08/04/2013 1028   ALT 17 08/04/2013 1028   BILITOT 0.3 08/04/2013 1028     Ferritin 348. Iron saturation 28%. Total iron 78.  Testosterone is 215.  Impression and Plan: James Zuniga is a 58 year old gentleman. He has iron deficiency anemia. This is resolved.  Rule out to increase his testosterone frequency. His level is still quite low. Hopefully with increasing his frequency of administration, this will make him feel better.  Again, I do think that he has to get his medicines sorted out. He does he's beyond a few medications., From what his diabetes is doing or how much he is following that.  I'll have him come back every 3 weeks for his testosterone.  I will see him back in 6 weeks.   Volanda Napoleon, MD 5/19/20156:44 AM

## 2013-11-29 ENCOUNTER — Telehealth: Payer: Self-pay | Admitting: Hematology & Oncology

## 2013-11-29 NOTE — Telephone Encounter (Signed)
Faxed Medical Records via fax today  to:  [facility] Ph: [phone] Fx: [phone]   Medical  Records requested from last [facility]

## 2013-12-15 DIAGNOSIS — E876 Hypokalemia: Secondary | ICD-10-CM | POA: Insufficient documentation

## 2013-12-18 ENCOUNTER — Ambulatory Visit: Payer: Self-pay

## 2013-12-18 ENCOUNTER — Telehealth: Payer: Self-pay | Admitting: Hematology & Oncology

## 2013-12-18 NOTE — Telephone Encounter (Signed)
Patient called and cx 12/18/13 inj apt and resch for 12/19/13

## 2013-12-19 ENCOUNTER — Ambulatory Visit (HOSPITAL_BASED_OUTPATIENT_CLINIC_OR_DEPARTMENT_OTHER): Payer: Medicare Other

## 2013-12-19 VITALS — BP 123/81 | HR 70 | Temp 97.7°F | Resp 20

## 2013-12-19 DIAGNOSIS — E291 Testicular hypofunction: Secondary | ICD-10-CM

## 2013-12-19 DIAGNOSIS — D509 Iron deficiency anemia, unspecified: Secondary | ICD-10-CM

## 2013-12-19 MED ORDER — TESTOSTERONE CYPIONATE 200 MG/ML IM SOLN
400.0000 mg | Freq: Once | INTRAMUSCULAR | Status: AC
  Administered 2013-12-19: 400 mg via INTRAMUSCULAR

## 2013-12-19 MED ORDER — TESTOSTERONE CYPIONATE 200 MG/ML IM SOLN
INTRAMUSCULAR | Status: AC
  Filled 2013-12-19: qty 2

## 2013-12-19 NOTE — Patient Instructions (Signed)
Testosterone Testosterone injection What is this medicine? TESTOSTERONE (tes TOS ter one) is the main male hormone. It supports normal male development such as muscle growth, facial hair, and deep voice. It is used in males to treat low testosterone levels. This medicine may be used for other purposes; ask your health care provider or pharmacist if you have questions. COMMON BRAND NAME(S): Andro-L.A., Aveed, Delatestryl, Depo-Testosterone, Virilon What should I tell my health care provider before I take this medicine? They need to know if you have any of these conditions: -breast cancer -diabetes -heart disease -kidney disease -liver disease -lung disease -prostate cancer, enlargement -an unusual or allergic reaction to testosterone, other medicines, foods, dyes, or preservatives -pregnant or trying to get pregnant -breast-feeding How should I use this medicine? This medicine is for injection into a muscle. It is usually given by a health care professional in a hospital or clinic setting. Contact your pediatrician regarding the use of this medicine in children. While this medicine may be prescribed for children as young as 44 years of age for selected conditions, precautions do apply. Overdosage: If you think you have taken too much of this medicine contact a poison control center or emergency room at once. NOTE: This medicine is only for you. Do not share this medicine with others. What if I miss a dose? Try not to miss a dose. Your doctor or health care professional will tell you when your next injection is due. Notify the office if you are unable to keep an appointment. What may interact with this medicine? -medicines for diabetes -medicines that treat or prevent blood clots like warfarin -oxyphenbutazone -propranolol -steroid medicines like prednisone or cortisone This list may not describe all possible interactions. Give your health care provider a list of all the medicines,  herbs, non-prescription drugs, or dietary supplements you use. Also tell them if you smoke, drink alcohol, or use illegal drugs. Some items may interact with your medicine. What should I watch for while using this medicine? Visit your doctor or health care professional for regular checks on your progress. They will need to check the level of testosterone in your blood. This medicine may affect blood sugar levels. If you have diabetes, check with your doctor or health care professional before you change your diet or the dose of your diabetic medicine. This drug is banned from use in athletes by most athletic organizations. What side effects may I notice from receiving this medicine? Side effects that you should report to your doctor or health care professional as soon as possible: -allergic reactions like skin rash, itching or hives, swelling of the face, lips, or tongue -breast enlargement -breathing problems -changes in mood, especially anger, depression, or rage -dark urine -general ill feeling or flu-like symptoms -light-colored stools -loss of appetite, nausea -nausea, vomiting -right upper belly pain -stomach pain -swelling of ankles -too frequent or persistent erections -trouble passing urine or change in the amount of urine -unusually weak or tired -yellowing of the eyes or skin Additional side effects that can occur in women include: -deep or hoarse voice -facial hair growth -irregular menstrual periods Side effects that usually do not require medical attention (report to your doctor or health care professional if they continue or are bothersome): -acne -change in sex drive or performance -hair loss -headache This list may not describe all possible side effects. Call your doctor for medical advice about side effects. You may report side effects to FDA at 1-800-FDA-1088. Where should I keep my medicine?  Keep out of the reach of children. This medicine can be abused. Keep your  medicine in a safe place to protect it from theft. Do not share this medicine with anyone. Selling or giving away this medicine is dangerous and against the law. Store at room temperature between 20 and 25 degrees C (68 and 77 degrees F). Do not freeze. Protect from light. Follow the directions for the product you are prescribed. Throw away any unused medicine after the expiration date. NOTE: This sheet is a summary. It may not cover all possible information. If you have questions about this medicine, talk to your doctor, pharmacist, or health care provider.  2014, Elsevier/Gold Standard. (2007-09-09 16:13:46)

## 2014-01-08 ENCOUNTER — Encounter: Payer: Self-pay | Admitting: Hematology & Oncology

## 2014-01-08 ENCOUNTER — Other Ambulatory Visit (HOSPITAL_BASED_OUTPATIENT_CLINIC_OR_DEPARTMENT_OTHER): Payer: Medicare Other | Admitting: Lab

## 2014-01-08 ENCOUNTER — Ambulatory Visit (HOSPITAL_BASED_OUTPATIENT_CLINIC_OR_DEPARTMENT_OTHER): Payer: Medicare Other

## 2014-01-08 ENCOUNTER — Ambulatory Visit (HOSPITAL_BASED_OUTPATIENT_CLINIC_OR_DEPARTMENT_OTHER): Payer: Medicare Other | Admitting: Hematology & Oncology

## 2014-01-08 VITALS — BP 126/84 | HR 74 | Temp 98.5°F | Resp 18 | Ht 67.0 in | Wt 199.0 lb

## 2014-01-08 DIAGNOSIS — E349 Endocrine disorder, unspecified: Secondary | ICD-10-CM

## 2014-01-08 DIAGNOSIS — D509 Iron deficiency anemia, unspecified: Secondary | ICD-10-CM

## 2014-01-08 DIAGNOSIS — E291 Testicular hypofunction: Secondary | ICD-10-CM

## 2014-01-08 DIAGNOSIS — I10 Essential (primary) hypertension: Secondary | ICD-10-CM

## 2014-01-08 DIAGNOSIS — E119 Type 2 diabetes mellitus without complications: Secondary | ICD-10-CM

## 2014-01-08 DIAGNOSIS — M1A9XX0 Chronic gout, unspecified, without tophus (tophi): Secondary | ICD-10-CM

## 2014-01-08 LAB — CBC WITH DIFFERENTIAL (CANCER CENTER ONLY)
BASO#: 0 10*3/uL (ref 0.0–0.2)
BASO%: 0.4 % (ref 0.0–2.0)
EOS%: 4.1 % (ref 0.0–7.0)
Eosinophils Absolute: 0.3 10*3/uL (ref 0.0–0.5)
HEMATOCRIT: 46.1 % (ref 38.7–49.9)
HGB: 15.5 g/dL (ref 13.0–17.1)
LYMPH#: 1.9 10*3/uL (ref 0.9–3.3)
LYMPH%: 25.2 % (ref 14.0–48.0)
MCH: 30.8 pg (ref 28.0–33.4)
MCHC: 33.6 g/dL (ref 32.0–35.9)
MCV: 92 fL (ref 82–98)
MONO#: 0.5 10*3/uL (ref 0.1–0.9)
MONO%: 6.3 % (ref 0.0–13.0)
NEUT#: 4.9 10*3/uL (ref 1.5–6.5)
NEUT%: 64 % (ref 40.0–80.0)
PLATELETS: 264 10*3/uL (ref 145–400)
RBC: 5.04 10*6/uL (ref 4.20–5.70)
RDW: 15.7 % (ref 11.1–15.7)
WBC: 7.6 10*3/uL (ref 4.0–10.0)

## 2014-01-08 LAB — CMP (CANCER CENTER ONLY)
ALBUMIN: 3.4 g/dL (ref 3.3–5.5)
ALK PHOS: 75 U/L (ref 26–84)
ALT(SGPT): 26 U/L (ref 10–47)
AST: 23 U/L (ref 11–38)
BUN, Bld: 6 mg/dL — ABNORMAL LOW (ref 7–22)
CALCIUM: 9.1 mg/dL (ref 8.0–10.3)
CHLORIDE: 102 meq/L (ref 98–108)
CO2: 31 mEq/L (ref 18–33)
Creat: 0.7 mg/dl (ref 0.6–1.2)
GLUCOSE: 106 mg/dL (ref 73–118)
POTASSIUM: 3.3 meq/L (ref 3.3–4.7)
Sodium: 145 mEq/L (ref 128–145)
TOTAL PROTEIN: 7.7 g/dL (ref 6.4–8.1)
Total Bilirubin: 0.7 mg/dl (ref 0.20–1.60)

## 2014-01-08 LAB — TESTOSTERONE: TESTOSTERONE: 243 ng/dL — AB (ref 300–890)

## 2014-01-08 MED ORDER — TESTOSTERONE CYPIONATE 200 MG/ML IM SOLN
INTRAMUSCULAR | Status: AC
  Filled 2014-01-08: qty 2

## 2014-01-08 MED ORDER — TESTOSTERONE CYPIONATE 200 MG/ML IM SOLN
400.0000 mg | Freq: Once | INTRAMUSCULAR | Status: AC
Start: 2014-01-08 — End: 2014-01-08
  Administered 2014-01-08: 400 mg via INTRAMUSCULAR

## 2014-01-08 NOTE — Patient Instructions (Signed)
Testosterone injection What is this medicine? TESTOSTERONE (tes TOS ter one) is the main male hormone. It supports normal male development such as muscle growth, facial hair, and deep voice. It is used in males to treat low testosterone levels. This medicine may be used for other purposes; ask your health care provider or pharmacist if you have questions. COMMON BRAND NAME(S): Andro-L.A., Aveed, Delatestryl, Depo-Testosterone, Virilon What should I tell my health care provider before I take this medicine? They need to know if you have any of these conditions: -breast cancer -diabetes -heart disease -kidney disease -liver disease -lung disease -prostate cancer, enlargement -an unusual or allergic reaction to testosterone, other medicines, foods, dyes, or preservatives -pregnant or trying to get pregnant -breast-feeding How should I use this medicine? This medicine is for injection into a muscle. It is usually given by a health care professional in a hospital or clinic setting. Contact your pediatrician regarding the use of this medicine in children. While this medicine may be prescribed for children as young as 32 years of age for selected conditions, precautions do apply. Overdosage: If you think you have taken too much of this medicine contact a poison control center or emergency room at once. NOTE: This medicine is only for you. Do not share this medicine with others. What if I miss a dose? Try not to miss a dose. Your doctor or health care professional will tell you when your next injection is due. Notify the office if you are unable to keep an appointment. What may interact with this medicine? -medicines for diabetes -medicines that treat or prevent blood clots like warfarin -oxyphenbutazone -propranolol -steroid medicines like prednisone or cortisone This list may not describe all possible interactions. Give your health care provider a list of all the medicines, herbs,  non-prescription drugs, or dietary supplements you use. Also tell them if you smoke, drink alcohol, or use illegal drugs. Some items may interact with your medicine. What should I watch for while using this medicine? Visit your doctor or health care professional for regular checks on your progress. They will need to check the level of testosterone in your blood. This medicine may affect blood sugar levels. If you have diabetes, check with your doctor or health care professional before you change your diet or the dose of your diabetic medicine. This drug is banned from use in athletes by most athletic organizations. What side effects may I notice from receiving this medicine? Side effects that you should report to your doctor or health care professional as soon as possible: -allergic reactions like skin rash, itching or hives, swelling of the face, lips, or tongue -breast enlargement -breathing problems -changes in mood, especially anger, depression, or rage -dark urine -general ill feeling or flu-like symptoms -light-colored stools -loss of appetite, nausea -nausea, vomiting -right upper belly pain -stomach pain -swelling of ankles -too frequent or persistent erections -trouble passing urine or change in the amount of urine -unusually weak or tired -yellowing of the eyes or skin Additional side effects that can occur in women include: -deep or hoarse voice -facial hair growth -irregular menstrual periods Side effects that usually do not require medical attention (report to your doctor or health care professional if they continue or are bothersome): -acne -change in sex drive or performance -hair loss -headache This list may not describe all possible side effects. Call your doctor for medical advice about side effects. You may report side effects to FDA at 1-800-FDA-1088. Where should I keep my medicine? Keep  out of the reach of children. This medicine can be abused. Keep your  medicine in a safe place to protect it from theft. Do not share this medicine with anyone. Selling or giving away this medicine is dangerous and against the law. Store at room temperature between 20 and 25 degrees C (68 and 77 degrees F). Do not freeze. Protect from light. Follow the directions for the product you are prescribed. Throw away any unused medicine after the expiration date. NOTE: This sheet is a summary. It may not cover all possible information. If you have questions about this medicine, talk to your doctor, pharmacist, or health care provider.  2015, Elsevier/Gold Standard. (2007-09-09 16:13:46)

## 2014-01-09 LAB — IRON AND TIBC CHCC
%SAT: 24 % (ref 20–55)
Iron: 66 ug/dL (ref 42–163)
TIBC: 275 ug/dL (ref 202–409)
UIBC: 209 ug/dL (ref 117–376)

## 2014-01-09 LAB — FERRITIN CHCC: Ferritin: 316 ng/ml (ref 22–316)

## 2014-01-10 NOTE — Progress Notes (Signed)
Hematology and Oncology Follow Up Visit  James Zuniga [id number] 03-May-1956 58 y.o. 59/07/2013   Principle Diagnosis:   Iron deficiency anemia  hypotestosteronemia  Chronic liver lesions  Current Therapy:    IV iron as indicated  Depot testosterone 400 mg IM Q3 weeks     Interim History:  [name] is back for followup. He is doing a little better. He is not as tired.  He continues on all his medications. He is on 4 or 5 blood pressure medicines. He's on medication for diabetes. He really would be nice if so his medications could be discontinued. Personally, I think these are some of his problems.  He's had no bleeding. He's had no problems with nausea vomiting. He's had no change in bowel or bladder habits. There's been no headache. He's had no cough. This no shortness of breath.  We are her during him testosterone. This is helping a little bit. He still has the erectile dysfunction. He cannot afford any of the medications for this.  Medications: Current outpatient prescriptions:Aclidinium Bromide 400 MCG/ACT AEPB, Inhale 1 puff into the lungs 2 (two) times daily., Disp: 1 each, Rfl: 0;  allopurinol (ZYLOPRIM) 300 MG tablet, Take 300 mg by mouth daily., Disp: , Rfl: ;  amLODipine (NORVASC) 5 MG tablet, Take 5 mg by mouth daily., Disp: , Rfl: ;  aspirin 81 MG EC tablet, Take 81 mg by mouth daily. Swallow whole., Disp: , Rfl:  budesonide-formoterol (SYMBICORT) 80-4.5 MCG/ACT inhaler, Inhale 2 puffs into the lungs 2 (two) times daily., Disp: 3 Inhaler, Rfl: 3;  Fluticasone Furoate-Vilanterol (BREO ELLIPTA) 100-25 MCG/INH AEPB, Inhale 2 puffs into the lungs 2 (two) times daily., Disp: , Rfl: ;  furosemide (LASIX) 20 MG tablet, Take 1 tablet by mouth 2 (two) times daily., Disp: , Rfl:  gabapentin (NEURONTIN) 300 MG capsule, Take 300 mg by mouth 3 (three) times daily. , Disp: , Rfl: ;  losartan (COZAAR) 50 MG tablet, Take 50 mg by mouth daily. , Disp: , Rfl: ;  metFORMIN (GLUCOPHAGE) 500 MG  tablet, Take 500 mg by mouth 2 (two) times daily with a meal., Disp: , Rfl: ;  metoprolol tartrate (LOPRESSOR) 25 MG tablet, Take 25 mg by mouth daily. , Disp: , Rfl:  olmesartan (BENICAR) 20 MG tablet, Take 20 mg by mouth daily., Disp: , Rfl: ;  potassium chloride (K-DUR,KLOR-CON) 10 MEQ tablet, Take 10 mEq by mouth 2 (two) times daily. , Disp: , Rfl: ;  predniSONE (DELTASONE) 10 MG tablet, Take 10 mg by mouth daily with breakfast. , Disp: , Rfl: ;  simvastatin (ZOCOR) 20 MG tablet, Take 1 tablet by mouth Daily., Disp: , Rfl:  tadalafil (CIALIS) 20 MG tablet, Take 1 tablet (20 mg total) by mouth daily as needed for erectile dysfunction., Disp: 10 tablet, Rfl: 0;  tiotropium (SPIRIVA) 18 MCG inhalation capsule, Place 1 capsule (18 mcg total) into inhaler and inhale daily., Disp: 90 capsule, Rfl: 3;  tiZANidine (ZANAFLEX) 2 MG tablet, Take 1 tablet by mouth 2 (two) times daily as needed for muscle spasms. , Disp: , Rfl:  traMADol (ULTRAM) 50 MG tablet, Take 50 mg by mouth daily as needed for moderate pain. , Disp: , Rfl: ;  zolpidem (AMBIEN) 5 MG tablet, Take 5 mg by mouth at bedtime., Disp: , Rfl: ;  ALPRAZolam (XANAX) 0.5 MG tablet, Take by mouth as needed. , Disp: , Rfl:   Allergies: No Known Allergies  Past Medical History, Surgical history, Social history, and  Family History were reviewed and updated.  Review of Systems: As above  Physical Exam:  height is 5\' 7"  (1.702 m) and weight is 199 lb (90.266 kg). His oral temperature is 98.5 F (36.9 C). His blood pressure is 126/84 and his pulse is 74. His respiration is 18.   Well-developed and well-nourished Afro-American gentleman. Lungs are clear bilaterally. Cardiac exam regular in rhythm. Abdomen soft. Has good bowel sounds. There is no fluid wave. There is no palpable liver or spleen tip. Back exam no tenderness over the spine ribs or hips. Extremities shows no clubbing cyanosis or edema. Neurological exam shows no focal neurological deficits.  Skin exam shows no rashes ecchymoses or petechia.  Lab Results  Component Value Date   WBC 7.6 01/08/2014   HGB 15.5 01/08/2014   HCT 46.1 01/08/2014   MCV 92 01/08/2014   PLT 264 01/08/2014     Chemistry      Component Value Date/Time   NA 145 01/08/2014 1200   NA 144 08/04/2013 1028   K 3.3 01/08/2014 1200   K 3.5 08/04/2013 1028   CL 102 01/08/2014 1200   CL 107 08/04/2013 1028   CO2 31 01/08/2014 1200   CO2 26 08/04/2013 1028   BUN 6* 01/08/2014 1200   BUN 8 08/04/2013 1028   CREATININE 0.7 01/08/2014 1200   CREATININE 0.88 08/04/2013 1028      Component Value Date/Time   CALCIUM 9.1 01/08/2014 1200   CALCIUM 8.7 08/04/2013 1028   ALKPHOS 75 01/08/2014 1200   ALKPHOS 79 08/04/2013 1028   AST 23 01/08/2014 1200   AST 21 08/04/2013 1028   ALT 26 01/08/2014 1200   ALT 17 08/04/2013 1028   BILITOT 0.70 01/08/2014 1200   BILITOT 0.3 08/04/2013 1028      Ferritin is 316. Iron saturation is 24%. Total iron is 66.  His testosterone was 243. Impression and Plan: James Zuniga is 58 year old gentleman. He has multiple issues. We are improve his anemia quite nicely. He's got 9. I think the testosterone also helped. Again in, with all his medications, I am not sure that he ever will have a normal CBC.  We will go ahead and give him testosterone today.  We will go ahead and plan to get him back in another 6 weeks to see me. He will come back in 3 weeks for a testosterone injection.   Volanda Napoleon, MD 7/1/20157:01 AM

## 2014-02-05 ENCOUNTER — Ambulatory Visit (HOSPITAL_BASED_OUTPATIENT_CLINIC_OR_DEPARTMENT_OTHER): Payer: Medicare Other

## 2014-02-05 VITALS — BP 120/84 | HR 65 | Temp 98.0°F | Resp 16

## 2014-02-05 DIAGNOSIS — D509 Iron deficiency anemia, unspecified: Secondary | ICD-10-CM

## 2014-02-05 DIAGNOSIS — E291 Testicular hypofunction: Secondary | ICD-10-CM

## 2014-02-05 MED ORDER — TESTOSTERONE CYPIONATE 200 MG/ML IM SOLN
400.0000 mg | Freq: Once | INTRAMUSCULAR | Status: AC
  Administered 2014-02-05: 400 mg via INTRAMUSCULAR

## 2014-02-05 MED ORDER — TESTOSTERONE CYPIONATE 200 MG/ML IM SOLN
INTRAMUSCULAR | Status: AC
  Filled 2014-02-05: qty 2

## 2014-02-05 NOTE — Patient Instructions (Signed)

## 2014-02-26 ENCOUNTER — Telehealth: Payer: Self-pay | Admitting: Internal Medicine

## 2014-02-26 ENCOUNTER — Other Ambulatory Visit: Payer: Self-pay | Admitting: Internal Medicine

## 2014-02-26 MED ORDER — ALPRAZOLAM 0.25 MG PO TABS: ORAL_TABLET | ORAL | Status: DC

## 2014-02-26 NOTE — Telephone Encounter (Signed)
Called spoke with pt. Aware RX has been called into [facility]. Nothing further needed

## 2014-02-26 NOTE — Telephone Encounter (Signed)
Called spoke with pt. He reports he is scheduled for CT scan tomorrow at 2:45. He is wanting something called in for nerves bc he gets very anxious during scan. Please advise thanks

## 2014-02-26 NOTE — Telephone Encounter (Signed)
Called and spoke with pt and he is aware of MR recs.  Pt stated that he does not take the xanax daily.  The last time he had the xanax was when he had the MRI done.  MR is it ok to send in 2 tablets of the xanax 0.25 mg.  Please advise. thanks

## 2014-02-26 NOTE — Telephone Encounter (Signed)
Yes 2 tab of xanax 0.25mg ; one to be taken night before and one morning of after getting to scan area. CAreful with driving

## 2014-02-26 NOTE — Telephone Encounter (Signed)
Let him know that is not lilke MRI and so should be ok. Also take xanax 0.25mg  night before and morning of scan after arrival at scan center

## 2014-02-27 ENCOUNTER — Ambulatory Visit (INDEPENDENT_AMBULATORY_CARE_PROVIDER_SITE_OTHER)
Admission: RE | Admit: 2014-02-27 | Discharge: 2014-02-27 | Disposition: A | Discharge status: Home / Self Care | Payer: Medicare Other | Source: Ambulatory Visit | Attending: Internal Medicine | Admitting: Internal Medicine

## 2014-02-27 DIAGNOSIS — R911 Solitary pulmonary nodule: Secondary | ICD-10-CM

## 2014-03-05 ENCOUNTER — Encounter: Payer: Self-pay | Admitting: Hematology & Oncology

## 2014-03-05 ENCOUNTER — Ambulatory Visit (HOSPITAL_BASED_OUTPATIENT_CLINIC_OR_DEPARTMENT_OTHER): Payer: Medicare Other | Admitting: Hematology & Oncology

## 2014-03-05 ENCOUNTER — Other Ambulatory Visit (HOSPITAL_BASED_OUTPATIENT_CLINIC_OR_DEPARTMENT_OTHER): Payer: Medicare Other | Admitting: Lab

## 2014-03-05 ENCOUNTER — Ambulatory Visit (HOSPITAL_BASED_OUTPATIENT_CLINIC_OR_DEPARTMENT_OTHER): Payer: Medicare Other

## 2014-03-05 VITALS — BP 133/88 | HR 65 | Temp 98.0°F | Resp 18 | Ht 67.0 in | Wt 194.0 lb

## 2014-03-05 DIAGNOSIS — D509 Iron deficiency anemia, unspecified: Secondary | ICD-10-CM

## 2014-03-05 DIAGNOSIS — E349 Endocrine disorder, unspecified: Secondary | ICD-10-CM

## 2014-03-05 DIAGNOSIS — E291 Testicular hypofunction: Secondary | ICD-10-CM

## 2014-03-05 LAB — CMP (CANCER CENTER ONLY)
ALBUMIN: 3.2 g/dL — AB (ref 3.3–5.5)
ALT: 18 U/L (ref 10–47)
AST: 22 U/L (ref 11–38)
Alkaline Phosphatase: 77 U/L (ref 26–84)
BILIRUBIN TOTAL: 0.6 mg/dL (ref 0.20–1.60)
BUN, Bld: 8 mg/dL (ref 7–22)
CHLORIDE: 100 meq/L (ref 98–108)
CO2: 28 mEq/L (ref 18–33)
Calcium: 8.7 mg/dL (ref 8.0–10.3)
Creat: 0.9 mg/dl (ref 0.6–1.2)
GLUCOSE: 86 mg/dL (ref 73–118)
Potassium: 3.1 mEq/L — ABNORMAL LOW (ref 3.3–4.7)
SODIUM: 138 meq/L (ref 128–145)
Total Protein: 7.7 g/dL (ref 6.4–8.1)

## 2014-03-05 LAB — CBC WITH DIFFERENTIAL (CANCER CENTER ONLY)
BASO#: 0 10*3/uL (ref 0.0–0.2)
BASO%: 0.6 % (ref 0.0–2.0)
EOS%: 5 % (ref 0.0–7.0)
Eosinophils Absolute: 0.3 10*3/uL (ref 0.0–0.5)
HEMATOCRIT: 44.4 % (ref 38.7–49.9)
HEMOGLOBIN: 14.8 g/dL (ref 13.0–17.1)
LYMPH#: 2 10*3/uL (ref 0.9–3.3)
LYMPH%: 30.4 % (ref 14.0–48.0)
MCH: 30.8 pg (ref 28.0–33.4)
MCHC: 33.3 g/dL (ref 32.0–35.9)
MCV: 93 fL (ref 82–98)
MONO#: 0.3 10*3/uL (ref 0.1–0.9)
MONO%: 5.3 % (ref 0.0–13.0)
NEUT#: 3.8 10*3/uL (ref 1.5–6.5)
NEUT%: 58.7 % (ref 40.0–80.0)
Platelets: 307 10*3/uL (ref 145–400)
RBC: 4.8 10*6/uL (ref 4.20–5.70)
RDW: 14.2 % (ref 11.1–15.7)
WBC: 6.4 10*3/uL (ref 4.0–10.0)

## 2014-03-05 MED ORDER — TESTOSTERONE CYPIONATE 200 MG/ML IM SOLN
400.0000 mg | Freq: Once | INTRAMUSCULAR | Status: AC
  Administered 2014-03-05: 400 mg via INTRAMUSCULAR

## 2014-03-05 MED ORDER — TESTOSTERONE CYPIONATE 200 MG/ML IM SOLN
INTRAMUSCULAR | Status: AC
  Filled 2014-03-05: qty 2

## 2014-03-05 NOTE — Patient Instructions (Signed)

## 2014-03-05 NOTE — Progress Notes (Signed)
Hematology and Oncology Follow Up Visit  James Zuniga [id number] 02/27/1956 58 y.o. 03/05/2014   Principle Diagnosis:   Iron deficiency anemia  hypotestosteronemia  Chronic liver lesions  Current Therapy:    IV iron as indicated  Depo- testosterone 400 mg IM Q3 weeks     Interim History:  Mr.  Korber is back for followup. He says he feels okay sometimes. He says he feels poorly sometimes.  I still think that a lot of his problem is all these medicines is taking. He is on 5 anti-hypertensive. He's on 3 inhalers. I can't figure out why he is on so much medications.  He does wear oxygen at nighttime. He has underlying COPD.  He is on low-dose prednisone. I suspect that this is for his underlying COPD.  He has had a problem bleeding. He's had no change in bowel or bladder habits.  He has had no problems with leg swelling. He's had no joint swelling. He's had no headache.  He recently had a CT scan done. This showed stable liver lesion. This is gone back 1 year. Had a 4 mm nodule in his upper lung which also was stable.  He's not had any swallowing problems. He's had no nausea or vomiting.  Medications: Current outpatient prescriptions:allopurinol (ZYLOPRIM) 300 MG tablet, Take 300 mg by mouth daily., Disp: , Rfl: ;  amLODipine (NORVASC) 5 MG tablet, Take 5 mg by mouth daily., Disp: , Rfl: ;  aspirin 81 MG EC tablet, Take 81 mg by mouth daily. Swallow whole., Disp: , Rfl: ;  budesonide-formoterol (SYMBICORT) 80-4.5 MCG/ACT inhaler, Inhale 2 puffs into the lungs 2 (two) times daily., Disp: 3 Inhaler, Rfl: 3 Fluticasone Furoate-Vilanterol (BREO ELLIPTA) 100-25 MCG/INH AEPB, Inhale 2 puffs into the lungs 2 (two) times daily., Disp: , Rfl: ;  furosemide (LASIX) 20 MG tablet, Take 1 tablet by mouth 2 (two) times daily., Disp: , Rfl: ;  gabapentin (NEURONTIN) 300 MG capsule, Take 300 mg by mouth 3 (three) times daily. , Disp: , Rfl: ;  losartan (COZAAR) 50 MG tablet, Take 50 mg by mouth  daily. , Disp: , Rfl:  metFORMIN (GLUCOPHAGE) 500 MG tablet, Take 500 mg by mouth 2 (two) times daily with a meal., Disp: , Rfl: ;  metoprolol tartrate (LOPRESSOR) 25 MG tablet, Take 25 mg by mouth daily. , Disp: , Rfl: ;  olmesartan (BENICAR) 20 MG tablet, Take 20 mg by mouth daily., Disp: , Rfl: ;  potassium chloride (K-DUR,KLOR-CON) 10 MEQ tablet, Take 10 mEq by mouth 2 (two) times daily. , Disp: , Rfl:  predniSONE (DELTASONE) 10 MG tablet, Take 10 mg by mouth daily with breakfast. , Disp: , Rfl: ;  simvastatin (ZOCOR) 20 MG tablet, Take 1 tablet by mouth Daily., Disp: , Rfl: ;  tiZANidine (ZANAFLEX) 2 MG tablet, Take 1 tablet by mouth 2 (two) times daily as needed for muscle spasms. , Disp: , Rfl: ;  traMADol (ULTRAM) 50 MG tablet, Take 50 mg by mouth daily as needed for moderate pain. , Disp: , Rfl:  zolpidem (AMBIEN) 5 MG tablet, Take 5 mg by mouth at bedtime., Disp: , Rfl: ;  Aclidinium Bromide 400 MCG/ACT AEPB, Inhale 1 puff into the lungs 2 (two) times daily., Disp: 1 each, Rfl: 0  Allergies: No Known Allergies  Past Medical History, Surgical history, Social history, and Family History were reviewed and updated.  Review of Systems: As above  Physical Exam:  height is 5\' 7"  (1.702 m) and weight is  194 lb (87.998 kg). His oral temperature is 98 F (36.7 C). His blood pressure is 133/88 and his pulse is 65. His respiration is 18.   Well-developed and well-nourished African American gentleman. Lungs are clear. Cardiac exam regular in rhythm. Abdomen is soft. Has good bowel sounds. There is no fluid wave. There is no palpable liver or spleen tip. Back exam no tenderness over the spine ribs or hips. Extremities shows no clubbing, cyanosis or edema. Skin exam no rashes. Neurological exam is nonfocal.  Lab Results  Component Value Date   WBC 6.4 03/05/2014   HGB 14.8 03/05/2014   HCT 44.4 03/05/2014   MCV 93 03/05/2014   PLT 307 03/05/2014     Chemistry      Component Value Date/Time   NA  138 03/05/2014 1316   NA 144 08/04/2013 1028   K 3.1* 03/05/2014 1316   K 3.5 08/04/2013 1028   CL 100 03/05/2014 1316   CL 107 08/04/2013 1028   CO2 28 03/05/2014 1316   CO2 26 08/04/2013 1028   BUN 8 03/05/2014 1316   BUN 8 08/04/2013 1028   CREATININE 0.9 03/05/2014 1316   CREATININE 0.88 08/04/2013 1028      Component Value Date/Time   CALCIUM 8.7 03/05/2014 1316   CALCIUM 8.7 08/04/2013 1028   ALKPHOS 77 03/05/2014 1316   ALKPHOS 79 08/04/2013 1028   AST 22 03/05/2014 1316   AST 21 08/04/2013 1028   ALT 18 03/05/2014 1316   ALT 17 08/04/2013 1028   BILITOT 0.60 03/05/2014 1316   BILITOT 0.3 08/04/2013 1028         Impression and Plan: Mr. Blansett is 58 year old [place]. He has multifactorial anemia. We have taken care of this nicely. He gets IV iron on occasion.  We will go ahead and give him a testosterone shot today.  I will see if his other doctors can adjust his medications. I think it's impossible to feel good when her on 5 different blood pressure medicines. He has the diabetes. He literally is on about 16 medicines.  From my point of view, I'll plan to get him back in about 4-6 weeks. I think a testosterone is held with him.  Spent about 35 minutes with he and his wife. I was just trying to go through all the medicines that is taken and trying to figure out how his alive can be simplified by taking less medicine.   Volanda Napoleon, MD 8/24/20156:56 PM

## 2014-03-05 NOTE — Patient Instructions (Signed)
Smoking Cessation Quitting smoking is important to your health and has many advantages. However, it is not always easy to quit since nicotine is a very addictive drug. Oftentimes, people try 3 times or more before being able to quit. This document explains the best ways for you to prepare to quit smoking. Quitting takes hard work and a lot of effort, but you can do it. ADVANTAGES OF QUITTING SMOKING  You will live longer, feel better, and live better.  Your body will feel the impact of quitting smoking almost immediately.  Within 20 minutes, blood pressure decreases. Your pulse returns to its normal level.  After 8 hours, carbon monoxide levels in the blood return to normal. Your oxygen level increases.  After 24 hours, the chance of having a heart attack starts to decrease. Your breath, hair, and body stop smelling like smoke.  After 48 hours, damaged nerve endings begin to recover. Your sense of taste and smell improve.  After 72 hours, the body is virtually free of nicotine. Your bronchial tubes relax and breathing becomes easier.  After 2 to 12 weeks, lungs can hold more air. Exercise becomes easier and circulation improves.  The risk of having a heart attack, stroke, cancer, or lung disease is greatly reduced.  After 1 year, the risk of coronary heart disease is cut in half.  After 5 years, the risk of stroke falls to the same as a nonsmoker.  After 10 years, the risk of lung cancer is cut in half and the risk of other cancers decreases significantly.  After 15 years, the risk of coronary heart disease drops, usually to the level of a nonsmoker.  If you are pregnant, quitting smoking will improve your chances of having a healthy baby.  The people you live with, especially any children, will be healthier.  You will have extra money to spend on things other than cigarettes. QUESTIONS TO THINK ABOUT BEFORE ATTEMPTING TO QUIT You may want to talk about your answers with your  health care provider.  Why do you want to quit?  If you tried to quit in the past, what helped and what did not?  What will be the most difficult situations for you after you quit? How will you plan to handle them?  Who can help you through the tough times? Your family? Friends? A health care provider?  What pleasures do you get from smoking? What ways can you still get pleasure if you quit? Here are some questions to ask your health care provider:  How can you help me to be successful at quitting?  What medicine do you think would be best for me and how should I take it?  What should I do if I need more help?  What is smoking withdrawal like? How can I get information on withdrawal? GET READY  Set a quit date.  Change your environment by getting rid of all cigarettes, ashtrays, matches, and lighters in your home, car, or work. Do not let people smoke in your home.  Review your past attempts to quit. Think about what worked and what did not. GET SUPPORT AND ENCOURAGEMENT You have a better chance of being successful if you have help. You can get support in many ways.  Tell your family, friends, and coworkers that you are going to quit and need their support. Ask them not to smoke around you.  Get individual, group, or telephone counseling and support. Programs are available at local hospitals and health centers. Call   your local health department for information about programs in your area.  Spiritual beliefs and practices may help some smokers quit.  Download a "quit meter" on your computer to keep track of quit statistics, such as how long you have gone without smoking, cigarettes not smoked, and money saved.  Get a self-help book about quitting smoking and staying off tobacco. LEARN NEW SKILLS AND BEHAVIORS  Distract yourself from urges to smoke. Talk to someone, go for a walk, or occupy your time with a task.  Change your normal routine. Take a different route to work.  Drink tea instead of coffee. Eat breakfast in a different place.  Reduce your stress. Take a hot bath, exercise, or read a book.  Plan something enjoyable to do every day. Reward yourself for not smoking.  Explore interactive web-based programs that specialize in helping you quit. GET MEDICINE AND USE IT CORRECTLY Medicines can help you stop smoking and decrease the urge to smoke. Combining medicine with the above behavioral methods and support can greatly increase your chances of successfully quitting smoking.  Nicotine replacement therapy helps deliver nicotine to your body without the negative effects and risks of smoking. Nicotine replacement therapy includes nicotine gum, lozenges, inhalers, nasal sprays, and skin patches. Some may be available over-the-counter and others require a prescription.  Antidepressant medicine helps people abstain from smoking, but how this works is unknown. This medicine is available by prescription.  Nicotinic receptor partial agonist medicine simulates the effect of nicotine in your brain. This medicine is available by prescription. Ask your health care provider for advice about which medicines to use and how to use them based on your health history. Your health care provider will tell you what side effects to look out for if you choose to be on a medicine or therapy. Carefully read the information on the package. Do not use any other product containing nicotine while using a nicotine replacement product.  RELAPSE OR DIFFICULT SITUATIONS Most relapses occur within the first 3 months after quitting. Do not be discouraged if you start smoking again. Remember, most people try several times before finally quitting. You may have symptoms of withdrawal because your body is used to nicotine. You may crave cigarettes, be irritable, feel very hungry, cough often, get headaches, or have difficulty concentrating. The withdrawal symptoms are only temporary. They are strongest  when you first quit, but they will go away within 10-14 days. To reduce the chances of relapse, try to:  Avoid drinking alcohol. Drinking lowers your chances of successfully quitting.  Reduce the amount of caffeine you consume. Once you quit smoking, the amount of caffeine in your body increases and can give you symptoms, such as a rapid heartbeat, sweating, and anxiety.  Avoid smokers because they can make you want to smoke.  Do not let weight gain distract you. Many smokers will gain weight when they quit, usually less than 10 pounds. Eat a healthy diet and stay active. You can always lose the weight gained after you quit.  Find ways to improve your mood other than smoking. FOR MORE INFORMATION  www.smokefree.gov  Document Released: 06/23/2001 Document Revised: 11/13/2013 Document Reviewed: 10/08/2011 [employer] Patient Information 2015 [employer]. This information is not intended to replace advice given to you by your health care provider. Make sure you discuss any questions you have with your health care provider.  

## 2014-03-06 ENCOUNTER — Telehealth: Payer: Self-pay | Admitting: Internal Medicine

## 2014-03-06 ENCOUNTER — Telehealth: Payer: Self-pay | Admitting: Hematology & Oncology

## 2014-03-06 LAB — TESTOSTERONE: Testosterone: 402 ng/dL (ref 300–890)

## 2014-03-06 LAB — IRON AND TIBC CHCC
%SAT: 18 % — ABNORMAL LOW (ref 20–55)
Iron: 49 ug/dL (ref 42–163)
TIBC: 275 ug/dL (ref 202–409)
UIBC: 226 ug/dL (ref 117–376)

## 2014-03-06 LAB — FERRITIN CHCC: Ferritin: 271 ng/ml (ref 22–316)

## 2014-03-06 NOTE — Telephone Encounter (Signed)
Mailed 9-28 schedule

## 2014-03-06 NOTE — Telephone Encounter (Signed)
James Zuniga  DR Marin Olp sent me message asking if we could simplify his med regimen and remove some copd meds and possibly bp meds. For sure we can tackle hiss copd meds. Please set him up for med calendar appt with Tammy Parrett. I will copy Tammy P as fyi on this  Thanks  Dr. Brand Males, M.D., [facility].C.P Pulmonary and Critical Care Medicine Staff Physician [place] [facility] Pager: [phone], If no answer or between  15:00h - 7:00h: call [phone]  03/06/2014 2:44 PM

## 2014-03-08 ENCOUNTER — Other Ambulatory Visit: Payer: Self-pay

## 2014-03-08 ENCOUNTER — Telehealth: Payer: Self-pay

## 2014-03-08 DIAGNOSIS — D509 Iron deficiency anemia, unspecified: Secondary | ICD-10-CM

## 2014-03-08 NOTE — Telephone Encounter (Addendum)
Message copied by Johny Drilling on Thu Mar 08, 2014  1:19 PM ------      Message from: Volanda Napoleon      Created: Tue Mar 06, 2014  9:37 PM       Call - iron is low again.  Need Feraheme 1020mg  x 1 dose.  Please set up.  pete ------ This information given to pt, then transferred to scheduler for appt. dph

## 2014-03-12 NOTE — Telephone Encounter (Signed)
MR please advise if you are ok filling this.  Xanax 0.5mg  tabs. Last filled on

## 2014-03-12 NOTE — Telephone Encounter (Signed)
Called and spoke to pt. Informed pt of recs per MR, for pt to see TP for med cal. Pt stated he is not able to make an additional appt d/t finances. Pt stated he would call back when able to make appt.   Will forward to MR to make aware.

## 2014-03-12 NOTE — Telephone Encounter (Signed)
Ok thanks. Will close message 

## 2014-03-15 ENCOUNTER — Ambulatory Visit (HOSPITAL_BASED_OUTPATIENT_CLINIC_OR_DEPARTMENT_OTHER): Payer: Medicare Other

## 2014-03-15 VITALS — BP 127/85 | HR 73 | Temp 98.5°F | Resp 20

## 2014-03-15 DIAGNOSIS — D509 Iron deficiency anemia, unspecified: Secondary | ICD-10-CM

## 2014-03-15 MED ORDER — SODIUM CHLORIDE 0.9 % IV SOLN
INTRAVENOUS | Status: DC
  Administered 2014-03-15: 11:00:00 via INTRAVENOUS

## 2014-03-15 MED ORDER — SODIUM CHLORIDE 0.9 % IV SOLN
1020.0000 mg | Freq: Once | INTRAVENOUS | Status: AC
  Administered 2014-03-15: 1020 mg via INTRAVENOUS
  Filled 2014-03-15: qty 34

## 2014-03-15 NOTE — Patient Instructions (Signed)

## 2014-04-02 ENCOUNTER — Ambulatory Visit (INDEPENDENT_AMBULATORY_CARE_PROVIDER_SITE_OTHER): Payer: Medicare Other | Admitting: Internal Medicine

## 2014-04-02 ENCOUNTER — Encounter: Payer: Self-pay | Admitting: Internal Medicine

## 2014-04-02 VITALS — BP 130/70 | HR 89 | Ht 66.0 in | Wt 197.0 lb

## 2014-04-02 DIAGNOSIS — J439 Emphysema, unspecified: Secondary | ICD-10-CM

## 2014-04-02 DIAGNOSIS — Z23 Encounter for immunization: Secondary | ICD-10-CM

## 2014-04-02 DIAGNOSIS — R911 Solitary pulmonary nodule: Secondary | ICD-10-CM

## 2014-04-02 DIAGNOSIS — J438 Other emphysema: Secondary | ICD-10-CM

## 2014-04-02 DIAGNOSIS — IMO0001 Reserved for inherently not codable concepts without codable children: Secondary | ICD-10-CM

## 2014-04-02 DIAGNOSIS — F17200 Nicotine dependence, unspecified, uncomplicated: Secondary | ICD-10-CM

## 2014-04-02 MED ORDER — TIOTROPIUM BROMIDE MONOHYDRATE 18 MCG IN CAPS
18.0000 ug | ORAL_CAPSULE | Freq: Every day | RESPIRATORY_TRACT | Status: DC
Start: 2014-04-02 — End: 2015-07-22

## 2014-04-02 NOTE — Addendum Note (Signed)
Addended by: Virl Cagey on: 04/02/2014 10:22 AM   Modules accepted: Orders

## 2014-04-02 NOTE — Patient Instructions (Addendum)
#  COPD -stable - continue symbicort daily - Please start spiriva 1 puff daily - take sample, script and show technique - retest for ONO on Room air to requalify for nocturnal o2 - FLu shot 04/02/2014 - Address PREVNAR vaccine at followup    #Lung nodule 30mm Aug 2014 and August 2015  - fu CT ches September 2016  #Liver lesion on MRI 06/19/2013  - please check if Dr Marin Olp can follow this for you now that Dr Amedeo Plenty is unable to   #SMoking  -try to quit on own  #Followup  - 6 months or sooner if needed

## 2014-04-02 NOTE — Progress Notes (Signed)
Subjective:    Patient ID: James Zuniga, male    DOB: [birth year], 58 y.o.   MRN: [id number]  HPI    OV 04/02/2014  Chief Complaint  Patient presents with  . Follow-up    Pt states his breathing is unchanged since last OV. Pt wearing 2lpm of O2 at night.  Pt c/o SOB with rest and activity, intermittent dry cough and chest discomfort in mid chest when active.      COPD :  Stable. No interim flare up. LAst seen dec 2014o. Using only symbicort (breo stopped due to cost). Caprice Renshaw was changed several months ago to spiriva but for unclear reasons he is not taking spiriva. Says triple MdI Rx helps dyspnea. He needs to requalify for noicturanl o2 per  His hx. He agrees to have flu shot today   Smoking:  reports that he has been smoking Cigarettes.  He started smoking about 30 years ago. He has a 30 pack-year smoking history. He has never used smokeless tobacco. Cannot have zyban or chantix  Lung nodule 1mm - stable aug 2014 and 2015   Past  - seing Dr Marin Olp for IRone Def Anemia and Low T - Dr Arelia Sneddon asked me to simplify his med regimen. He is on statin, metformin, pain meds and several bp meds and IRone IV and Testosterone infusion - beyond my scope to stop or simplify   Review of Systems  Constitutional: Negative for fever and unexpected weight change.  HENT: Negative for congestion, dental problem, ear pain, nosebleeds, postnasal drip, rhinorrhea, sinus pressure, sneezing, sore throat and trouble swallowing.   Eyes: Negative for redness and itching.  Respiratory: Positive for cough, chest tightness and shortness of breath. Negative for wheezing.   Cardiovascular: Positive for palpitations and leg swelling.  Gastrointestinal: Negative for nausea and vomiting.  Genitourinary: Negative for dysuria.  Musculoskeletal: Negative for joint swelling.  Skin: Negative for rash.  Neurological: Negative for headaches.  Hematological: Does not bruise/bleed easily.  Psychiatric/Behavioral:  Negative for dysphoric mood. The patient is not nervous/anxious.    Current outpatient prescriptions:allopurinol (ZYLOPRIM) 300 MG tablet, Take 300 mg by mouth daily., Disp: , Rfl: ;  amLODipine (NORVASC) 5 MG tablet, Take 5 mg by mouth daily., Disp: , Rfl: ;  aspirin 81 MG EC tablet, Take 81 mg by mouth daily. Swallow whole., Disp: , Rfl: ;  budesonide-formoterol (SYMBICORT) 80-4.5 MCG/ACT inhaler, Inhale 2 puffs into the lungs 2 (two) times daily., Disp: 3 Inhaler, Rfl: 3 furosemide (LASIX) 20 MG tablet, Take 1 tablet by mouth 2 (two) times daily., Disp: , Rfl: ;  gabapentin (NEURONTIN) 300 MG capsule, Take 300 mg by mouth 3 (three) times daily. , Disp: , Rfl: ;  losartan (COZAAR) 50 MG tablet, Take 50 mg by mouth daily. , Disp: , Rfl: ;  metFORMIN (GLUCOPHAGE) 500 MG tablet, Take 500 mg by mouth 2 (two) times daily with a meal., Disp: , Rfl:  metoprolol tartrate (LOPRESSOR) 25 MG tablet, Take 25 mg by mouth daily. , Disp: , Rfl: ;  potassium chloride (K-DUR,KLOR-CON) 10 MEQ tablet, Take 10 mEq by mouth 2 (two) times daily. , Disp: , Rfl: ;  simvastatin (ZOCOR) 20 MG tablet, Take 1 tablet by mouth Daily., Disp: , Rfl: ;  tiZANidine (ZANAFLEX) 2 MG tablet, Take 1 tablet by mouth 2 (two) times daily as needed for muscle spasms. , Disp: , Rfl:  traMADol (ULTRAM) 50 MG tablet, Take 50 mg by mouth daily as needed for moderate pain. ,  Disp: , Rfl: ;  zolpidem (AMBIEN) 5 MG tablet, Take 5 mg by mouth at bedtime., Disp: , Rfl:      Objective:   Physical Exam   Filed Vitals:   04/02/14 0951  BP: 130/70  Pulse: 89  Height: 5\' 6"  (1.676 m)  Weight: 197 lb (89.359 kg)  SpO2: 95%    Physical Exam Nursing note and vitals reviewed. Constitutional: He is oriented to person, place, and time. He appears well-developed and well-nourished. No distress.  HENT:  Head: Normocephalic and atraumatic.  Right Ear: External ear normal.  Left Ear: External ear normal.  Mouth/Throat: Oropharynx is clear and moist.  No oropharyngeal exudate.  Eyes: Conjunctivae normal and EOM are normal. Pupils are equal, round, and reactive to light. Right eye exhibits no discharge. Left eye exhibits no discharge. No scleral icterus.  Neck: Normal range of motion. Neck supple. No JVD present. No tracheal deviation present. No thyromegaly present.  Cardiovascular: Normal rate, regular rhythm and intact distal pulses.  Exam reveals no gallop and no friction rub.   No murmur heard. Pulmonary/Chest: Effort normal and breath sounds normal. No respiratory distress. He has no wheezes. He has no rales. He exhibits no tenderness.  Abdominal: Soft. Bowel sounds are normal. He exhibits no distension and no mass. There is no tenderness. There is no rebound and no guarding.  Musculoskeletal: Normal range of motion. He exhibits no edema and no tenderness.       Walks with cane  Lymphadenopathy:    He has no cervical adenopathy.  Neurological: He is alert and oriented to person, place, and time. He has normal reflexes. No cranial nerve deficit. Coordination normal.  Skin: Skin is warm and dry. No rash noted. He is not diaphoretic. No erythema. No pallor.  Psychiatric: He has a normal mood and affect. His behavior is normal. Judgment and thought content normal.        Assessment & Plan:  #COPD -stable - continue symbicort daily - Please start spiriva 1 puff daily - take sample, script and show technique - retest for ONO on Room air to requalify for nocturnal o2 - FLu shot 04/02/2014 - Address PREVNAR vaccine at followup    #Lung nodule 54mm Aug 2014 and August 2015  - fu CT ches September 2016  #Liver lesion on MRI 06/19/2013  - please check if Dr Marin Olp can follow this for you now that Dr Amedeo Plenty is unable to   #SMoking  -try to quit on own  #Followup  - 6 months or sooner if needed

## 2014-04-06 ENCOUNTER — Telehealth: Payer: Self-pay | Admitting: Internal Medicine

## 2014-04-06 NOTE — Telephone Encounter (Signed)
James Zuniga, have you seen ONO results on pt? thanks

## 2014-04-09 ENCOUNTER — Ambulatory Visit (HOSPITAL_BASED_OUTPATIENT_CLINIC_OR_DEPARTMENT_OTHER): Payer: Medicare Other | Admitting: Family

## 2014-04-09 ENCOUNTER — Ambulatory Visit (HOSPITAL_BASED_OUTPATIENT_CLINIC_OR_DEPARTMENT_OTHER): Payer: Medicare Other

## 2014-04-09 ENCOUNTER — Other Ambulatory Visit (HOSPITAL_BASED_OUTPATIENT_CLINIC_OR_DEPARTMENT_OTHER): Payer: Medicare Other | Admitting: Lab

## 2014-04-09 ENCOUNTER — Encounter: Payer: Self-pay | Admitting: Family

## 2014-04-09 VITALS — BP 105/69 | HR 69 | Temp 97.8°F | Resp 18 | Ht 66.0 in | Wt 196.0 lb

## 2014-04-09 DIAGNOSIS — I471 Supraventricular tachycardia: Secondary | ICD-10-CM

## 2014-04-09 DIAGNOSIS — E349 Endocrine disorder, unspecified: Secondary | ICD-10-CM

## 2014-04-09 DIAGNOSIS — E291 Testicular hypofunction: Secondary | ICD-10-CM

## 2014-04-09 DIAGNOSIS — D509 Iron deficiency anemia, unspecified: Secondary | ICD-10-CM

## 2014-04-09 LAB — CBC WITH DIFFERENTIAL (CANCER CENTER ONLY)
BASO#: 0 10*3/uL (ref 0.0–0.2)
BASO%: 0.3 % (ref 0.0–2.0)
EOS%: 4.1 % (ref 0.0–7.0)
Eosinophils Absolute: 0.2 10*3/uL (ref 0.0–0.5)
HCT: 44.9 % (ref 38.7–49.9)
HGB: 15.2 g/dL (ref 13.0–17.1)
LYMPH#: 1.9 10*3/uL (ref 0.9–3.3)
LYMPH%: 32.4 % (ref 14.0–48.0)
MCH: 31.1 pg (ref 28.0–33.4)
MCHC: 33.9 g/dL (ref 32.0–35.9)
MCV: 92 fL (ref 82–98)
MONO#: 0.3 10*3/uL (ref 0.1–0.9)
MONO%: 5 % (ref 0.0–13.0)
NEUT%: 58.2 % (ref 40.0–80.0)
NEUTROS ABS: 3.4 10*3/uL (ref 1.5–6.5)
Platelets: 269 10*3/uL (ref 145–400)
RBC: 4.88 10*6/uL (ref 4.20–5.70)
RDW: 13.8 % (ref 11.1–15.7)
WBC: 5.8 10*3/uL (ref 4.0–10.0)

## 2014-04-09 LAB — CHCC SATELLITE - SMEAR

## 2014-04-09 LAB — CMP (CANCER CENTER ONLY)
ALBUMIN: 3.3 g/dL (ref 3.3–5.5)
ALK PHOS: 77 U/L (ref 26–84)
ALT(SGPT): 36 U/L (ref 10–47)
AST: 28 U/L (ref 11–38)
BUN, Bld: 10 mg/dL (ref 7–22)
CHLORIDE: 102 meq/L (ref 98–108)
CO2: 25 mEq/L (ref 18–33)
Calcium: 9.3 mg/dL (ref 8.0–10.3)
Creat: 1 mg/dl (ref 0.6–1.2)
Glucose, Bld: 117 mg/dL (ref 73–118)
POTASSIUM: 2.9 meq/L — AB (ref 3.3–4.7)
SODIUM: 141 meq/L (ref 128–145)
TOTAL PROTEIN: 7.3 g/dL (ref 6.4–8.1)
Total Bilirubin: 0.6 mg/dl (ref 0.20–1.60)

## 2014-04-09 LAB — FERRITIN CHCC: Ferritin: 585 ng/ml — ABNORMAL HIGH (ref 22–316)

## 2014-04-09 LAB — TESTOSTERONE: Testosterone: 104 ng/dL — ABNORMAL LOW (ref 300–890)

## 2014-04-09 LAB — IRON AND TIBC CHCC
%SAT: 39 % (ref 20–55)
IRON: 107 ug/dL (ref 42–163)
TIBC: 275 ug/dL (ref 202–409)
UIBC: 168 ug/dL (ref 117–376)

## 2014-04-09 LAB — RETICULOCYTES (CHCC)
ABS RETIC: 45.1 10*3/uL (ref 19.0–186.0)
RBC.: 5.01 MIL/uL (ref 4.22–5.81)
Retic Ct Pct: 0.9 % (ref 0.4–2.3)

## 2014-04-09 MED ORDER — TESTOSTERONE CYPIONATE 200 MG/ML IM SOLN: 400.0000 mg | Freq: Once | INTRAMUSCULAR | Status: DC

## 2014-04-09 MED ORDER — TESTOSTERONE CYPIONATE 200 MG/ML IM SOLN
400.0000 mg | Freq: Once | INTRAMUSCULAR | Status: AC
Start: 2014-04-09 — End: 2014-04-09
  Administered 2014-04-09: 400 mg via INTRAMUSCULAR

## 2014-04-09 MED ORDER — TESTOSTERONE CYPIONATE 200 MG/ML IM SOLN
INTRAMUSCULAR | Status: AC
  Filled 2014-04-09: qty 2

## 2014-04-09 NOTE — Patient Instructions (Signed)

## 2014-04-09 NOTE — Progress Notes (Signed)
[facility]  Telephone:([phone] Fax:([phone]) [phone]  ID: James Zuniga OB: Sep 14, 1955 MR#: [id number] UVO#:[id number] Patient Care Team: Berkley Harvey, NP as PCP - General (Nurse Practitioner) Missy Sabins, MD as Consulting Physician (Gastroenterology)  DIAGNOSIS: Iron deficiency anemia  Hypotestosteronemia  Chronic liver lesions  INTERVAL HISTORY: James Zuniga is here today for a follow-up. He is feeling very tired. He had a sleep study done last week and he is waiting on the results. He is on a great deal of medication. 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 He denies fever, chills, n/v, cough, rash, headache, dizziness, SOB, chest pain, palpitations, abdominal pain, constipation, diarrhea, blood in urine or stool. He has had no swelling, tenderness, numbness or tingling in his extremities. His appetite is good and he is staying hydrated. His chest CT in August showed a stable liver lesion and also a stable right upper lung nadule.  CURRENT TREATMENT: IV iron as indicated  Depo- testosterone 400 mg IM Q3 weeks  REVIEW OF SYSTEMS: All other 10 point review of systems is negative except for those issues mentioned above.   PAST MEDICAL HISTORY: Past Medical History  Diagnosis Date  . CHF (congestive heart failure)   . Coronary artery disease   . Diabetes mellitus   . Hypertension   . Renal disorder   . Hyperlipidemia   . Iron deficiency anemia, unspecified 07/01/2013  . Hypotestosteronism 07/01/2013   PAST SURGICAL HISTORY: Past Surgical History  Procedure Laterality Date  . No past surgeries     FAMILY HISTORY Family History  Problem Relation Age of Onset  . Heart disease Mother   . Stroke Brother   . Diabetes Father    GYNECOLOGIC HISTORY:  No LMP for male patient.   SOCIAL HISTORY:  History   Social History  . Marital Status: Divorced    Spouse Name: N/A    Number of Children: N/A  . Years of Education: N/A   Occupational  History  . Not on file.   Social History Main Topics  . Smoking status: Current Every Day Smoker -- 1.00 packs/day for 30 years    Types: Cigarettes    Start date: 08/05/1983  . Smokeless tobacco: Never Used     Comment: 04-07-14  still smoking everyday    . Alcohol Use: No  . Drug Use: Not on file  . Sexual Activity: Not on file   Other Topics Concern  . Not on file   Social History Narrative  . No narrative on file   ADVANCED DIRECTIVES: <no information>  HEALTH MAINTENANCE: History  Substance Use Topics  . Smoking status: Current Every Day Smoker -- 1.00 packs/day for 30 years    Types: Cigarettes    Start date: 08/05/1983  . Smokeless tobacco: Never Used     Comment: 04-07-14  still smoking everyday    . Alcohol Use: No   Colonoscopy: PAP: Bone density: Lipid panel:  No Known Allergies  Current Outpatient Prescriptions  Medication Sig Dispense Refill  . allopurinol (ZYLOPRIM) 300 MG tablet Take 300 mg by mouth daily.      Marland Kitchen amLODipine (NORVASC) 5 MG tablet Take 5 mg by mouth daily.      Marland Kitchen aspirin 81 MG EC tablet Take 81 mg by mouth daily. Swallow whole.      . budesonide-formoterol (SYMBICORT) 80-4.5 MCG/ACT inhaler Inhale 2 puffs into the lungs 2 (two) times daily.  3 Inhaler  3  .  furosemide (LASIX) 20 MG tablet Take 1 tablet by mouth 2 (two) times daily.      Marland Kitchen gabapentin (NEURONTIN) 300 MG capsule Take 300 mg by mouth 3 (three) times daily.       Marland Kitchen losartan (COZAAR) 50 MG tablet Take 50 mg by mouth daily.       . metFORMIN (GLUCOPHAGE) 500 MG tablet Take 500 mg by mouth 2 (two) times daily with a meal. 1/2 TAB IN AM AND FULL TAB IN PM      . metoprolol tartrate (LOPRESSOR) 25 MG tablet Take 25 mg by mouth daily.       . potassium chloride (K-DUR,KLOR-CON) 10 MEQ tablet Take 10 mEq by mouth daily.       . simvastatin (ZOCOR) 20 MG tablet Take 1 tablet by mouth Daily.      Marland Kitchen tiotropium (SPIRIVA) 18 MCG inhalation capsule Place 1 capsule (18 mcg total) into  inhaler and inhale daily.  30 capsule  6  . tiZANidine (ZANAFLEX) 2 MG tablet Take 1 tablet by mouth 2 (two) times daily as needed for muscle spasms.       . traMADol (ULTRAM) 50 MG tablet Take 50 mg by mouth daily as needed for moderate pain.       Marland Kitchen zolpidem (AMBIEN) 5 MG tablet Take 5 mg by mouth at bedtime.       No current facility-administered medications for this visit.   OBJECTIVE: Filed Vitals:   04/09/14 1132  BP: 105/69  Pulse: 69  Temp: 97.8 F (36.6 C)  Resp: 18   Body mass index is 31.65 kg/(m^2). ECOG FS:1 - Symptomatic but completely ambulatory Ocular: Sclerae unicteric, pupils equal, round and reactive to light Ear-nose-throat: Oropharynx clear, dentition fair Lymphatic: No cervical or supraclavicular adenopathy Lungs no rales or rhonchi, good excursion bilaterally Heart regular rate and rhythm, no murmur appreciated Abd soft, nontender, positive bowel sounds MSK no focal spinal tenderness, no joint edema Neuro: non-focal, well-oriented, appropriate affect Breasts: Deferred  LAB RESULTS: CMP     Component Value Date/Time   NA 141 04/09/2014 1039   NA 144 08/04/2013 1028   K 2.9* 04/09/2014 1039   K 3.5 08/04/2013 1028   CL 102 04/09/2014 1039   CL 107 08/04/2013 1028   CO2 25 04/09/2014 1039   CO2 26 08/04/2013 1028   GLUCOSE 117 04/09/2014 1039   GLUCOSE 93 08/04/2013 1028   BUN 10 04/09/2014 1039   BUN 8 08/04/2013 1028   CREATININE 1.0 04/09/2014 1039   CREATININE 0.88 08/04/2013 1028   CALCIUM 9.3 04/09/2014 1039   CALCIUM 8.7 08/04/2013 1028   PROT 7.3 04/09/2014 1039   PROT 7.0 08/04/2013 1028   ALBUMIN 3.7 08/04/2013 1028   AST 28 04/09/2014 1039   AST 21 08/04/2013 1028   ALT 36 04/09/2014 1039   ALT 17 08/04/2013 1028   ALKPHOS 77 04/09/2014 1039   ALKPHOS 79 08/04/2013 1028   BILITOT 0.60 04/09/2014 1039   BILITOT 0.3 08/04/2013 1028   GFRNONAA >90 11/15/2011 1703   GFRAA >90 11/15/2011 1703   No results found for this basename: SPEP, UPEP,  kappa and lambda  light chains   Lab Results  Component Value Date   WBC 5.8 04/09/2014   NEUTROABS 3.4 04/09/2014   HGB 15.2 04/09/2014   HCT 44.9 04/09/2014   MCV 92 04/09/2014   PLT 269 04/09/2014   No results found for this basename: LABCA2   No components found with this basename: [id number]  No results found for this basename: INR,  in the last 168 hours Urinalysis No results found for this basename: colorurine, appearanceur, labspec, phurine, glucoseu, hgbur, bilirubinur, ketonesur, proteinur, urobilinogen, nitrite, leukocytesur   STUDIES: No results found.  ASSESSMENT/PLAN: Mr. Kotowski is a pleasant 58 year old African American male with multifactorial anemia. He last had iron earlier this month.   His CBC and CMP today are ok. We will continue to keep a close eye on his potassium. We will wait and see what his iron studies show.  We will go ahead and give him a testosterone shot today.  We will see him back in 2 months for labs and follow-up.  He knows to call here with any questions or concerns and to go to the ED in the event of an emergency. We can certainly see him sooner if need be.    [name], NP 04/09/2014 2:46 PM

## 2014-04-10 ENCOUNTER — Other Ambulatory Visit: Payer: Self-pay | Admitting: Family

## 2014-04-10 NOTE — Telephone Encounter (Signed)
ono 04/03/14 shows no desats. He does NOT qualify for nocturnal o2. LEft at [place] on side B 6:22 PM 04/10/2014   Thans  Dr. Brand Males, M.D., F.C.C.P Pulmonary and Critical Care Medicine Staff Physician [facility] Pager: [phone], If no answer or between  15:00h - 7:00h: call [phone]  04/10/2014 6:22 PM

## 2014-04-11 NOTE — Telephone Encounter (Signed)
Called and spoke to pt. Informed pt of results and recs per MR. Pt verbalized understanding and denied any further questions or concerns at this time.

## 2014-04-30 ENCOUNTER — Ambulatory Visit (HOSPITAL_BASED_OUTPATIENT_CLINIC_OR_DEPARTMENT_OTHER): Payer: Medicare Other

## 2014-04-30 VITALS — BP 140/100 | HR 57 | Temp 98.3°F | Resp 16 | Wt 204.0 lb

## 2014-04-30 DIAGNOSIS — D509 Iron deficiency anemia, unspecified: Secondary | ICD-10-CM

## 2014-04-30 DIAGNOSIS — E291 Testicular hypofunction: Secondary | ICD-10-CM

## 2014-04-30 MED ORDER — TESTOSTERONE CYPIONATE 200 MG/ML IM SOLN
400.0000 mg | Freq: Once | INTRAMUSCULAR | Status: AC
  Administered 2014-04-30: 400 mg via INTRAMUSCULAR

## 2014-04-30 MED ORDER — TESTOSTERONE CYPIONATE 200 MG/ML IM SOLN
INTRAMUSCULAR | Status: AC
  Filled 2014-04-30: qty 2

## 2014-04-30 NOTE — Patient Instructions (Signed)

## 2014-05-28 ENCOUNTER — Ambulatory Visit (HOSPITAL_BASED_OUTPATIENT_CLINIC_OR_DEPARTMENT_OTHER): Payer: Medicare Other

## 2014-05-28 ENCOUNTER — Other Ambulatory Visit (HOSPITAL_BASED_OUTPATIENT_CLINIC_OR_DEPARTMENT_OTHER): Payer: Medicare Other | Admitting: Lab

## 2014-05-28 ENCOUNTER — Encounter: Payer: Self-pay | Admitting: Hematology & Oncology

## 2014-05-28 ENCOUNTER — Ambulatory Visit (HOSPITAL_BASED_OUTPATIENT_CLINIC_OR_DEPARTMENT_OTHER): Payer: Medicare Other | Admitting: Hematology & Oncology

## 2014-05-28 VITALS — BP 131/91 | HR 71 | Temp 98.1°F | Resp 18 | Ht 66.0 in | Wt 199.0 lb

## 2014-05-28 DIAGNOSIS — E349 Endocrine disorder, unspecified: Secondary | ICD-10-CM

## 2014-05-28 DIAGNOSIS — E291 Testicular hypofunction: Secondary | ICD-10-CM

## 2014-05-28 DIAGNOSIS — D509 Iron deficiency anemia, unspecified: Secondary | ICD-10-CM

## 2014-05-28 LAB — CMP (CANCER CENTER ONLY)
ALK PHOS: 83 U/L (ref 26–84)
ALT(SGPT): 34 U/L (ref 10–47)
AST: 29 U/L (ref 11–38)
Albumin: 3.4 g/dL (ref 3.3–5.5)
BUN, Bld: 7 mg/dL (ref 7–22)
CO2: 28 mEq/L (ref 18–33)
Calcium: 9.4 mg/dL (ref 8.0–10.3)
Chloride: 100 mEq/L (ref 98–108)
Creat: 0.7 mg/dl (ref 0.6–1.2)
GLUCOSE: 97 mg/dL (ref 73–118)
POTASSIUM: 3.3 meq/L (ref 3.3–4.7)
SODIUM: 141 meq/L (ref 128–145)
Total Bilirubin: 0.6 mg/dl (ref 0.20–1.60)
Total Protein: 8.6 g/dL — ABNORMAL HIGH (ref 6.4–8.1)

## 2014-05-28 LAB — CBC WITH DIFFERENTIAL (CANCER CENTER ONLY)
BASO#: 0 10*3/uL (ref 0.0–0.2)
BASO%: 0.5 % (ref 0.0–2.0)
EOS%: 4.5 % (ref 0.0–7.0)
Eosinophils Absolute: 0.3 10*3/uL (ref 0.0–0.5)
HEMATOCRIT: 49.6 % (ref 38.7–49.9)
HGB: 16.6 g/dL (ref 13.0–17.1)
LYMPH#: 2.2 10*3/uL (ref 0.9–3.3)
LYMPH%: 39.9 % (ref 14.0–48.0)
MCH: 30 pg (ref 28.0–33.4)
MCHC: 33.5 g/dL (ref 32.0–35.9)
MCV: 90 fL (ref 82–98)
MONO#: 0.3 10*3/uL (ref 0.1–0.9)
MONO%: 5 % (ref 0.0–13.0)
NEUT#: 2.8 10*3/uL (ref 1.5–6.5)
NEUT%: 50.1 % (ref 40.0–80.0)
Platelets: 316 10*3/uL (ref 145–400)
RBC: 5.54 10*6/uL (ref 4.20–5.70)
RDW: 14.1 % (ref 11.1–15.7)
WBC: 5.6 10*3/uL (ref 4.0–10.0)

## 2014-05-28 LAB — RETICULOCYTES (CHCC)
ABS Retic: 43.1 10*3/uL (ref 19.0–186.0)
RBC.: 5.39 MIL/uL (ref 4.22–5.81)
Retic Ct Pct: 0.8 % (ref 0.4–2.3)

## 2014-05-28 MED ORDER — TESTOSTERONE CYPIONATE 200 MG/ML IM SOLN
INTRAMUSCULAR | Status: AC
  Filled 2014-05-28: qty 2

## 2014-05-28 MED ORDER — TESTOSTERONE CYPIONATE 200 MG/ML IM SOLN
400.0000 mg | Freq: Once | INTRAMUSCULAR | Status: AC
  Administered 2014-05-28: 400 mg via INTRAMUSCULAR

## 2014-05-28 NOTE — Patient Instructions (Signed)

## 2014-05-29 LAB — IRON AND TIBC CHCC
%SAT: 22 % (ref 20–55)
Iron: 60 ug/dL (ref 42–163)
TIBC: 266 ug/dL (ref 202–409)
UIBC: 207 ug/dL (ref 117–376)

## 2014-05-29 LAB — FERRITIN CHCC: Ferritin: 456 ng/ml — ABNORMAL HIGH (ref 22–316)

## 2014-06-05 NOTE — Progress Notes (Signed)
Hematology and Oncology Follow Up Visit  James Zuniga [id number] 1955-09-18 58 y.o. 06/05/2014   Principle Diagnosis:   Iron deficiency anemia  hypotestosteronemia  Chronic liver lesions  Current Therapy:    IV iron as indicated  Depo- testosterone 400 mg IM Q3 weeks     Interim History:  [name] is back for followup. He says he feels okay sometimes. He says he feels poorly sometimes. His last testosterone level was 104. This certainly may contribute to his feeling of fatigue.  His last iron level looked okay. We would not have to give him iron probably for about 3 months.  I still think that a lot of his problem is all these medicines is taking. He is on 5 anti-hypertensive. He's on 3 inhalers. I can't figure out why he is on so much medications.  He does wear oxygen at nighttime. He has underlying COPD.  He is on low-dose prednisone. I suspect that this is for his underlying COPD.  He has no problem with bleeding. He's had no change in bowel or bladder habits.  He has had no problems with leg swelling. He's had no joint swelling. He's had no headache.  He recently had a CT scan done. This showed stable liver lesion. This is gone back 1 year. Had a 4 mm nodule in his upper lung which also was stable.  He's not had any swallowing problems. He's had no nausea or vomiting.  Medications: Current outpatient prescriptions: allopurinol (ZYLOPRIM) 300 MG tablet, Take 300 mg by mouth daily., Disp: , Rfl: ;  amLODipine (NORVASC) 5 MG tablet, Take 5 mg by mouth daily., Disp: , Rfl: ;  aspirin 81 MG EC tablet, Take 81 mg by mouth daily. Swallow whole., Disp: , Rfl: ;  budesonide-formoterol (SYMBICORT) 80-4.5 MCG/ACT inhaler, Inhale 2 puffs into the lungs 2 (two) times daily., Disp: 3 Inhaler, Rfl: 3 furosemide (LASIX) 20 MG tablet, Take 1 tablet by mouth 2 (two) times daily., Disp: , Rfl: ;  gabapentin (NEURONTIN) 300 MG capsule, Take 300 mg by mouth 3 (three) times daily. , Disp: ,  Rfl: ;  HYDROcodone-acetaminophen (NORCO/VICODIN) 5-325 MG per tablet, Take 1 tablet by mouth every 6 (six) hours as needed. , Disp: , Rfl: ;  losartan (COZAAR) 50 MG tablet, Take 50 mg by mouth daily. , Disp: , Rfl:  metoprolol tartrate (LOPRESSOR) 25 MG tablet, Take 25 mg by mouth daily. , Disp: , Rfl: ;  potassium chloride (K-DUR,KLOR-CON) 10 MEQ tablet, Take 10 mEq by mouth daily. , Disp: , Rfl: ;  simvastatin (ZOCOR) 20 MG tablet, Take 1 tablet by mouth Daily., Disp: , Rfl: ;  tiotropium (SPIRIVA) 18 MCG inhalation capsule, Place 1 capsule (18 mcg total) into inhaler and inhale daily., Disp: 30 capsule, Rfl: 6 tiZANidine (ZANAFLEX) 2 MG tablet, Take 1 tablet by mouth 2 (two) times daily as needed for muscle spasms. , Disp: , Rfl: ;  traMADol (ULTRAM) 50 MG tablet, Take 50 mg by mouth daily as needed for moderate pain. , Disp: , Rfl: ;  zolpidem (AMBIEN) 5 MG tablet, Take 5 mg by mouth at bedtime., Disp: , Rfl:   Allergies: No Known Allergies  Past Medical History, Surgical history, Social history, and Family History were reviewed and updated.  Review of Systems: As above  Physical Exam:  height is 5\' 6"  (1.676 m) and weight is 199 lb (90.266 kg). His oral temperature is 98.1 F (36.7 C). His blood pressure is 131/91 and his pulse  is 71. His respiration is 18.   Well-developed and well-nourished African American gentleman. Head and neck exam shows no ocular or oral lesions. He has no adenopathy. Lungs are clear. Cardiac exam is regular rate and rhythm. There are no murmurs, rubs or bruits. Abdomen is soft. Has good bowel sounds. There is no fluid wave. There is no palpable liver or spleen tip. Back exam shows no tenderness over the spine ribs or hips. Extremities shows no clubbing, cyanosis or edema. Skin exam shows no rashes, ecchymoses or petechia. Neurological exam is nonfocal.  Lab Results  Component Value Date   WBC 5.6 05/28/2014   HGB 16.6 05/28/2014   HCT 49.6 05/28/2014   MCV 90  05/28/2014   PLT 316 05/28/2014     Chemistry      Component Value Date/Time   NA 141 05/28/2014 1336   NA 144 08/04/2013 1028   K 3.3 05/28/2014 1336   K 3.5 08/04/2013 1028   CL 100 05/28/2014 1336   CL 107 08/04/2013 1028   CO2 28 05/28/2014 1336   CO2 26 08/04/2013 1028   BUN 7 05/28/2014 1336   BUN 8 08/04/2013 1028   CREATININE 0.7 05/28/2014 1336   CREATININE 0.88 08/04/2013 1028      Component Value Date/Time   CALCIUM 9.4 05/28/2014 1336   CALCIUM 8.7 08/04/2013 1028   ALKPHOS 83 05/28/2014 1336   ALKPHOS 79 08/04/2013 1028   AST 29 05/28/2014 1336   AST 21 08/04/2013 1028   ALT 34 05/28/2014 1336   ALT 17 08/04/2013 1028   BILITOT 0.60 05/28/2014 1336   BILITOT 0.3 08/04/2013 1028     Ferritin is 456. Iron saturation is 22%. Total iron is 60.    Impression and Plan: James Zuniga is 58 year old African-American male. He has multifactorial anemia. We have taken care of this nicely. He gets IV iron on occasion.  We will go ahead and give him a testosterone shot today.  I will see if his other doctors can adjust his medications. I think it's impossible to feel good when he is on 5 different blood pressure medicines. He has diabetes. He literally is on about 16 medicines.  From my point of view, I'll plan to get him back in about 4-6 weeks. I think the testosterone injection is helping him.  Spent about 35 minutes with he and his wife. I was just trying to go through all the medicines that is taken and trying to figure out how his medication regimen can be simplified . James Napoleon, MD 11/24/20157:31 AM

## 2014-06-18 ENCOUNTER — Ambulatory Visit (HOSPITAL_BASED_OUTPATIENT_CLINIC_OR_DEPARTMENT_OTHER): Payer: Medicare Other

## 2014-06-18 DIAGNOSIS — E291 Testicular hypofunction: Secondary | ICD-10-CM

## 2014-06-18 DIAGNOSIS — D509 Iron deficiency anemia, unspecified: Secondary | ICD-10-CM

## 2014-06-18 MED ORDER — TESTOSTERONE CYPIONATE 200 MG/ML IM SOLN
INTRAMUSCULAR | Status: AC
  Filled 2014-06-18: qty 2

## 2014-06-18 MED ORDER — TESTOSTERONE CYPIONATE 200 MG/ML IM SOLN
400.0000 mg | Freq: Once | INTRAMUSCULAR | Status: AC
  Administered 2014-06-18: 400 mg via INTRAMUSCULAR

## 2014-06-18 NOTE — Patient Instructions (Signed)

## 2014-07-09 ENCOUNTER — Ambulatory Visit (HOSPITAL_BASED_OUTPATIENT_CLINIC_OR_DEPARTMENT_OTHER): Payer: Medicare Other

## 2014-07-09 ENCOUNTER — Other Ambulatory Visit (HOSPITAL_BASED_OUTPATIENT_CLINIC_OR_DEPARTMENT_OTHER): Payer: Medicare Other | Admitting: Lab

## 2014-07-09 ENCOUNTER — Ambulatory Visit (HOSPITAL_BASED_OUTPATIENT_CLINIC_OR_DEPARTMENT_OTHER): Payer: Medicare Other | Admitting: Family

## 2014-07-09 ENCOUNTER — Encounter: Payer: Self-pay | Admitting: Family

## 2014-07-09 VITALS — BP 123/88 | HR 88 | Temp 98.0°F | Resp 18 | Ht 67.0 in | Wt 199.0 lb

## 2014-07-09 DIAGNOSIS — E291 Testicular hypofunction: Secondary | ICD-10-CM

## 2014-07-09 DIAGNOSIS — E349 Endocrine disorder, unspecified: Secondary | ICD-10-CM

## 2014-07-09 DIAGNOSIS — D509 Iron deficiency anemia, unspecified: Secondary | ICD-10-CM

## 2014-07-09 LAB — CBC WITH DIFFERENTIAL (CANCER CENTER ONLY)
BASO#: 0 10*3/uL (ref 0.0–0.2)
BASO%: 0.4 % (ref 0.0–2.0)
EOS ABS: 0.2 10*3/uL (ref 0.0–0.5)
EOS%: 4.2 % (ref 0.0–7.0)
HEMATOCRIT: 47 % (ref 38.7–49.9)
HEMOGLOBIN: 15.5 g/dL (ref 13.0–17.1)
LYMPH#: 1.6 10*3/uL (ref 0.9–3.3)
LYMPH%: 28.5 % (ref 14.0–48.0)
MCH: 29.7 pg (ref 28.0–33.4)
MCHC: 33 g/dL (ref 32.0–35.9)
MCV: 90 fL (ref 82–98)
MONO#: 0.2 10*3/uL (ref 0.1–0.9)
MONO%: 3.7 % (ref 0.0–13.0)
NEUT%: 63.2 % (ref 40.0–80.0)
NEUTROS ABS: 3.6 10*3/uL (ref 1.5–6.5)
Platelets: 301 10*3/uL (ref 145–400)
RBC: 5.22 10*6/uL (ref 4.20–5.70)
RDW: 13.9 % (ref 11.1–15.7)
WBC: 5.7 10*3/uL (ref 4.0–10.0)

## 2014-07-09 LAB — CMP (CANCER CENTER ONLY)
ALBUMIN: 3.4 g/dL (ref 3.3–5.5)
ALK PHOS: 82 U/L (ref 26–84)
ALT(SGPT): 30 U/L (ref 10–47)
AST: 26 U/L (ref 11–38)
BUN: 5 mg/dL — AB (ref 7–22)
CO2: 27 mEq/L (ref 18–33)
CREATININE: 0.9 mg/dL (ref 0.6–1.2)
Calcium: 9.2 mg/dL (ref 8.0–10.3)
Chloride: 103 mEq/L (ref 98–108)
Glucose, Bld: 142 mg/dL — ABNORMAL HIGH (ref 73–118)
POTASSIUM: 2.9 meq/L — AB (ref 3.3–4.7)
Sodium: 142 mEq/L (ref 128–145)
Total Bilirubin: 0.6 mg/dl (ref 0.20–1.60)
Total Protein: 7.7 g/dL (ref 6.4–8.1)

## 2014-07-09 LAB — TESTOSTERONE: Testosterone: 397 ng/dL (ref 300–890)

## 2014-07-09 LAB — IRON AND TIBC CHCC
%SAT: 18 % — ABNORMAL LOW (ref 20–55)
Iron: 49 ug/dL (ref 42–163)
TIBC: 273 ug/dL (ref 202–409)
UIBC: 223 ug/dL (ref 117–376)

## 2014-07-09 LAB — FERRITIN CHCC: Ferritin: 376 ng/ml — ABNORMAL HIGH (ref 22–316)

## 2014-07-09 MED ORDER — TESTOSTERONE CYPIONATE 200 MG/ML IM SOLN
INTRAMUSCULAR | Status: AC
  Filled 2014-07-09: qty 2

## 2014-07-09 MED ORDER — TESTOSTERONE CYPIONATE 200 MG/ML IM SOLN
400.0000 mg | Freq: Once | INTRAMUSCULAR | Status: AC
  Administered 2014-07-09: 400 mg via INTRAMUSCULAR

## 2014-07-09 NOTE — Patient Instructions (Signed)

## 2014-07-09 NOTE — Progress Notes (Signed)
[place]  [phone] Fax:([phone]) [phone]  ID: James Zuniga OB: 07/18/1955 MR#: [id number]#:[id number] Patient Care Team: Berkley Harvey, NP as PCP - General (Nurse Practitioner) Missy Sabins, MD as Consulting Physician (Gastroenterology)  DIAGNOSIS: Iron deficiency anemia  Hypotestosteronemia  Chronic liver lesions  INTERVAL HISTORY: James Zuniga is here today for a follow-up.He is feeling tired today. He is still having issues because of his arthritis.  He has some SOB with exertion. He has COPD and emphysema and still uses O2 at nighttime.   He denies fever, chills, n/v, cough, rash, headache, dizziness, chest pain, palpitations, abdominal pain, constipation, diarrhea, blood in urine or stool.  He has had no swelling, tenderness, numbness or tingling in his extremities.  His appetite is good and he is staying hydrated. His weight is stable at 199 lbs.  His chest CT in August showed a stable liver lesion and also a stable right upper lung nodule.  CURRENT TREATMENT: IV iron as indicated  Depo- testosterone 400 mg IM Q3 weeks  REVIEW OF SYSTEMS: All other 10 point review of systems is negative except for those issues mentioned above.   PAST MEDICAL HISTORY: Past Medical History  Diagnosis Date  . CHF (congestive heart failure)   . Coronary artery disease   . Diabetes mellitus   . Hypertension   . Renal disorder   . Hyperlipidemia   . Iron deficiency anemia, unspecified 07/01/2013  . Hypotestosteronism 07/01/2013   PAST SURGICAL HISTORY: Past Surgical History  Procedure Laterality Date  . No past surgeries     FAMILY HISTORY Family History  Problem Relation Age of Onset  . Heart disease Mother   . Stroke Brother   . Diabetes Father    GYNECOLOGIC HISTORY:  No LMP for male patient.   SOCIAL HISTORY:  History   Social History  . Marital Status: Divorced    Spouse Name: N/A    Number of Children: N/A  . Years of Education: N/A    Occupational History  . Not on file.   Social History Main Topics  . Smoking status: Current Every Day Smoker -- 1.00 packs/day for 30 years    Types: Cigarettes    Start date: 08/05/1983  . Smokeless tobacco: Never Used     Comment: 07-09-14  still smoking everyday    . Alcohol Use: No  . Drug Use: Not on file  . Sexual Activity: Not on file   Other Topics Concern  . Not on file   Social History Narrative   ADVANCED DIRECTIVES: <no information>  HEALTH MAINTENANCE: History  Substance Use Topics  . Smoking status: Current Every Day Smoker -- 1.00 packs/day for 30 years    Types: Cigarettes    Start date: 08/05/1983  . Smokeless tobacco: Never Used     Comment: 07-09-14  still smoking everyday    . Alcohol Use: No   Colonoscopy: PAP: Bone density: Lipid panel:  No Known Allergies  Current Outpatient Prescriptions  Medication Sig Dispense Refill  . allopurinol (ZYLOPRIM) 300 MG tablet Take 300 mg by mouth daily.    Marland Kitchen amLODipine (NORVASC) 5 MG tablet Take 5 mg by mouth daily.    Marland Kitchen aspirin 81 MG EC tablet Take 81 mg by mouth daily. Swallow whole.    . budesonide-formoterol (SYMBICORT) 80-4.5 MCG/ACT inhaler Inhale 2 puffs into the lungs 2 (two) times daily. 3 Inhaler 3  . furosemide (LASIX) 20 MG tablet Take 1 tablet by mouth 2 (two)  times daily.    Marland Kitchen gabapentin (NEURONTIN) 300 MG capsule Take 300 mg by mouth 3 (three) times daily.     Marland Kitchen losartan (COZAAR) 50 MG tablet Take 50 mg by mouth daily.     . metoprolol tartrate (LOPRESSOR) 25 MG tablet Take 25 mg by mouth daily.     . potassium chloride (K-DUR,KLOR-CON) 10 MEQ tablet Take 10 mEq by mouth daily.     . simvastatin (ZOCOR) 20 MG tablet Take 1 tablet by mouth Daily.    Marland Kitchen tiotropium (SPIRIVA) 18 MCG inhalation capsule Place 1 capsule (18 mcg total) into inhaler and inhale daily. 30 capsule 6  . tiZANidine (ZANAFLEX) 2 MG tablet Take 1 tablet by mouth 2 (two) times daily as needed for muscle spasms.     .  traMADol (ULTRAM) 50 MG tablet Take 50 mg by mouth daily as needed for moderate pain.     Marland Kitchen zolpidem (AMBIEN) 5 MG tablet Take 5 mg by mouth at bedtime.     Current Facility-Administered Medications  Medication Dose Route Frequency Provider Last Rate Last Dose  . testosterone cypionate (DEPOTESTOTERONE CYPIONATE) injection 400 mg  400 mg Intramuscular Once [name], NP       OBJECTIVE: Filed Vitals:   07/09/14 0912  BP: 123/88  Pulse: 88  Temp: 98 F (36.7 C)  Resp: 18   Body mass index is 31.16 kg/(m^2). ECOG FS:1 - Symptomatic but completely ambulatory Ocular: Sclerae unicteric, pupils equal, round and reactive to light Ear-nose-throat: Oropharynx clear, dentition fair Lymphatic: No cervical or supraclavicular adenopathy Lungs no rales or rhonchi, good excursion bilaterally Heart regular rate and rhythm, no murmur appreciated Abd soft, nontender, positive bowel sounds MSK no focal spinal tenderness, no joint edema Neuro: non-focal, well-oriented, appropriate affect Breasts: Deferred  LAB RESULTS: CMP     Component Value Date/Time   NA 141 05/28/2014 1336   NA 144 08/04/2013 1028   K 3.3 05/28/2014 1336   K 3.5 08/04/2013 1028   CL 100 05/28/2014 1336   CL 107 08/04/2013 1028   CO2 28 05/28/2014 1336   CO2 26 08/04/2013 1028   GLUCOSE 97 05/28/2014 1336   GLUCOSE 93 08/04/2013 1028   BUN 7 05/28/2014 1336   BUN 8 08/04/2013 1028   CREATININE 0.7 05/28/2014 1336   CREATININE 0.88 08/04/2013 1028   CALCIUM 9.4 05/28/2014 1336   CALCIUM 8.7 08/04/2013 1028   PROT 8.6* 05/28/2014 1336   PROT 7.0 08/04/2013 1028   ALBUMIN 3.7 08/04/2013 1028   AST 29 05/28/2014 1336   AST 21 08/04/2013 1028   ALT 34 05/28/2014 1336   ALT 17 08/04/2013 1028   ALKPHOS 83 05/28/2014 1336   ALKPHOS 79 08/04/2013 1028   BILITOT 0.60 05/28/2014 1336   BILITOT 0.3 08/04/2013 1028   GFRNONAA >90 11/15/2011 1703   GFRAA >90 11/15/2011 1703   No results found for: SPEP Lab  Results  Component Value Date   WBC 5.7 07/09/2014   NEUTROABS 3.6 07/09/2014   HGB 15.5 07/09/2014   HCT 47.0 07/09/2014   MCV 90 07/09/2014   PLT 301 07/09/2014   No results found for: LABCA2 No components found for: LABCA125 No results for input(s): INR in the last 168 hours. Urinalysis No results found for: COLORURINE STUDIES: No results found.  ASSESSMENT/PLAN: Mr. Tuller is a pleasant 58 year old African American male with multifactorial anemia and hypotestosteronemia. He is feeling tired today. He last had Fereheme in September and did well with it.  He will continue getting his testosterone injections every 3 weeks.   His Hgb is 25.5, MCV 90. We will see what his iron studies show.  We will see him back in 6 weeks for labs and follow-up.  He knows to call here with any questions or concerns and to go to the ED in the event of an emergency. We can certainly see him sooner if need be.   [name], NP 07/09/2014 10:02 AM

## 2014-07-10 ENCOUNTER — Telehealth: Payer: Self-pay | Admitting: *Deleted

## 2014-07-10 ENCOUNTER — Other Ambulatory Visit: Payer: Self-pay | Admitting: *Deleted

## 2014-07-10 DIAGNOSIS — D509 Iron deficiency anemia, unspecified: Secondary | ICD-10-CM

## 2014-07-10 NOTE — Telephone Encounter (Addendum)
Patient aware of results. Appointment made.  ----- Message from Volanda Napoleon, MD sent at 07/09/2014  1:21 PM EST ----- Call - iron is low. Need Feraheme 510mg  x 1 dose. Please set up next week.  Laurey Arrow

## 2014-07-17 ENCOUNTER — Ambulatory Visit (HOSPITAL_BASED_OUTPATIENT_CLINIC_OR_DEPARTMENT_OTHER): Payer: Self-pay

## 2014-07-17 DIAGNOSIS — D509 Iron deficiency anemia, unspecified: Secondary | ICD-10-CM

## 2014-07-17 MED ORDER — FERUMOXYTOL INJECTION 510 MG/17 ML
510.0000 mg | Freq: Once | INTRAVENOUS | Status: AC
  Administered 2014-07-17: 510 mg via INTRAVENOUS
  Filled 2014-07-17: qty 17

## 2014-07-17 MED ORDER — TESTOSTERONE CYPIONATE 200 MG/ML IM SOLN: 400.0000 mg | Freq: Once | INTRAMUSCULAR | Status: DC

## 2014-07-17 MED ORDER — SODIUM CHLORIDE 0.9 % IV SOLN
Freq: Once | INTRAVENOUS | Status: AC
  Administered 2014-07-17: 12:00:00 via INTRAVENOUS

## 2014-07-17 NOTE — Patient Instructions (Signed)

## 2014-07-30 ENCOUNTER — Ambulatory Visit (HOSPITAL_BASED_OUTPATIENT_CLINIC_OR_DEPARTMENT_OTHER): Payer: PPO

## 2014-07-30 DIAGNOSIS — E291 Testicular hypofunction: Secondary | ICD-10-CM

## 2014-07-30 DIAGNOSIS — D509 Iron deficiency anemia, unspecified: Secondary | ICD-10-CM

## 2014-07-30 MED ORDER — TESTOSTERONE CYPIONATE 200 MG/ML IM SOLN
400.0000 mg | Freq: Once | INTRAMUSCULAR | Status: AC
  Administered 2014-07-30: 400 mg via INTRAMUSCULAR

## 2014-07-30 MED ORDER — TESTOSTERONE CYPIONATE 200 MG/ML IM SOLN
INTRAMUSCULAR | Status: AC
  Filled 2014-07-30: qty 2

## 2014-07-30 NOTE — Patient Instructions (Signed)

## 2014-08-20 ENCOUNTER — Ambulatory Visit (HOSPITAL_BASED_OUTPATIENT_CLINIC_OR_DEPARTMENT_OTHER): Payer: PPO | Admitting: Hematology & Oncology

## 2014-08-20 ENCOUNTER — Encounter: Payer: Self-pay | Admitting: Hematology & Oncology

## 2014-08-20 ENCOUNTER — Ambulatory Visit (HOSPITAL_BASED_OUTPATIENT_CLINIC_OR_DEPARTMENT_OTHER): Payer: PPO

## 2014-08-20 ENCOUNTER — Other Ambulatory Visit (HOSPITAL_BASED_OUTPATIENT_CLINIC_OR_DEPARTMENT_OTHER): Payer: PPO | Admitting: Lab

## 2014-08-20 VITALS — BP 134/88 | HR 74 | Temp 98.2°F | Resp 18 | Ht 67.0 in | Wt 202.0 lb

## 2014-08-20 DIAGNOSIS — E349 Endocrine disorder, unspecified: Secondary | ICD-10-CM

## 2014-08-20 DIAGNOSIS — D509 Iron deficiency anemia, unspecified: Secondary | ICD-10-CM

## 2014-08-20 DIAGNOSIS — E291 Testicular hypofunction: Secondary | ICD-10-CM

## 2014-08-20 DIAGNOSIS — M1A40X0 Other secondary chronic gout, unspecified site, without tophus (tophi): Secondary | ICD-10-CM

## 2014-08-20 LAB — CBC WITH DIFFERENTIAL (CANCER CENTER ONLY)
BASO#: 0 10*3/uL (ref 0.0–0.2)
BASO%: 0.5 % (ref 0.0–2.0)
EOS ABS: 0.3 10*3/uL (ref 0.0–0.5)
EOS%: 4.4 % (ref 0.0–7.0)
HCT: 48.6 % (ref 38.7–49.9)
HEMOGLOBIN: 16 g/dL (ref 13.0–17.1)
LYMPH#: 1.9 10*3/uL (ref 0.9–3.3)
LYMPH%: 32.6 % (ref 14.0–48.0)
MCH: 29.6 pg (ref 28.0–33.4)
MCHC: 32.9 g/dL (ref 32.0–35.9)
MCV: 90 fL (ref 82–98)
MONO#: 0.4 10*3/uL (ref 0.1–0.9)
MONO%: 6.3 % (ref 0.0–13.0)
NEUT#: 3.2 10*3/uL (ref 1.5–6.5)
NEUT%: 56.2 % (ref 40.0–80.0)
Platelets: 291 10*3/uL (ref 145–400)
RBC: 5.41 10*6/uL (ref 4.20–5.70)
RDW: 14.9 % (ref 11.1–15.7)
WBC: 5.7 10*3/uL (ref 4.0–10.0)

## 2014-08-20 LAB — CMP (CANCER CENTER ONLY)
ALBUMIN: 3.4 g/dL (ref 3.3–5.5)
ALK PHOS: 84 U/L (ref 26–84)
ALT(SGPT): 33 U/L (ref 10–47)
AST: 31 U/L (ref 11–38)
BILIRUBIN TOTAL: 0.7 mg/dL (ref 0.20–1.60)
BUN, Bld: 7 mg/dL (ref 7–22)
CALCIUM: 8.9 mg/dL (ref 8.0–10.3)
CO2: 30 mEq/L (ref 18–33)
Chloride: 102 mEq/L (ref 98–108)
Creat: 0.9 mg/dl (ref 0.6–1.2)
Glucose, Bld: 125 mg/dL — ABNORMAL HIGH (ref 73–118)
Potassium: 3.3 mEq/L (ref 3.3–4.7)
Sodium: 144 mEq/L (ref 128–145)
Total Protein: 7.6 g/dL (ref 6.4–8.1)

## 2014-08-20 LAB — IRON AND TIBC CHCC
%SAT: 36 % (ref 20–55)
Iron: 94 ug/dL (ref 42–163)
TIBC: 260 ug/dL (ref 202–409)
UIBC: 166 ug/dL (ref 117–376)

## 2014-08-20 LAB — FERRITIN CHCC: FERRITIN: 604 ng/mL — AB (ref 22–316)

## 2014-08-20 LAB — TESTOSTERONE: TESTOSTERONE: 282 ng/dL — AB (ref 300–890)

## 2014-08-20 LAB — RETICULOCYTES (CHCC)
ABS Retic: 64 10*3/uL (ref 19.0–186.0)
RBC.: 5.33 MIL/uL (ref 4.22–5.81)
RETIC CT PCT: 1.2 % (ref 0.4–2.3)

## 2014-08-20 MED ORDER — TESTOSTERONE CYPIONATE 200 MG/ML IM SOLN: 400.0000 mg | Freq: Once | INTRAMUSCULAR | Status: DC

## 2014-08-20 MED ORDER — TESTOSTERONE CYPIONATE 200 MG/ML IM SOLN
INTRAMUSCULAR | Status: AC
  Filled 2014-08-20: qty 2

## 2014-08-20 MED ORDER — TESTOSTERONE CYPIONATE 200 MG/ML IM SOLN
400.0000 mg | Freq: Once | INTRAMUSCULAR | Status: AC
  Administered 2014-08-20: 400 mg via INTRAMUSCULAR

## 2014-08-20 NOTE — Patient Instructions (Signed)

## 2014-08-20 NOTE — Progress Notes (Signed)
Hematology and Oncology Follow Up Visit  James Zuniga [id number] 01-Nov-1955 59 y.o. 08/20/2014   Principle Diagnosis:   Iron deficiency anemia  hypotestosteronemia  Chronic liver lesions  Current Therapy:    IV iron as indicated  Depo- testosterone 400 mg IM Q3 weeks     Interim History:  James Zuniga is back for followup. He says he is feeling better. He doesn't have as many bad days. He feels the testosterone is helping him. feels okay sometimes. He says he feels poorly sometimes. His last testosterone level was 397 back in December. We are doing a good job in maintaining his testosterone level where it can help him.   His last iron level looked okay. We have not had to give him iron probably for about 5 months.  I still think that a lot of his problem is all these medicines is taking. He is on 5 anti-hypertensive. He's on 3 inhalers. I can't figure out why he is on so much medications.  He does wear oxygen at nighttime. He has underlying COPD.  He is on low-dose prednisone. I suspect that this is for his underlying COPD.  He has no problem with bleeding. He's had no change in bowel or bladder habits.  He has had no problems with leg swelling. He's had no joint swelling. He's had no headache.  He's not had any swallowing problems. He's had no nausea or vomiting.  Medications:  Current outpatient prescriptions:  .  allopurinol (ZYLOPRIM) 300 MG tablet, Take 300 mg by mouth daily., Disp: , Rfl:  .  amLODipine (NORVASC) 5 MG tablet, Take 5 mg by mouth daily., Disp: , Rfl:  .  aspirin 81 MG EC tablet, Take 81 mg by mouth daily. Swallow whole., Disp: , Rfl:  .  budesonide-formoterol (SYMBICORT) 80-4.5 MCG/ACT inhaler, Inhale 2 puffs into the lungs 2 (two) times daily., Disp: 3 Inhaler, Rfl: 3 .  diclofenac sodium (VOLTAREN) 1 % GEL, APPLY 4 GRAMS TO KNEES THREE TIMES DAILY AS NEEDED, Disp: , Rfl:  .  furosemide (LASIX) 20 MG tablet, Take 1 tablet by mouth 2 (two) times daily.,  Disp: , Rfl:  .  gabapentin (NEURONTIN) 300 MG capsule, Take 300 mg by mouth 3 (three) times daily. , Disp: , Rfl:  .  losartan (COZAAR) 50 MG tablet, Take 50 mg by mouth daily. , Disp: , Rfl:  .  metoprolol tartrate (LOPRESSOR) 25 MG tablet, Take 25 mg by mouth daily. , Disp: , Rfl:  .  omeprazole (PRILOSEC) 20 MG capsule, Take 20 mg by mouth daily. , Disp: , Rfl: 0 .  potassium chloride (K-DUR,KLOR-CON) 10 MEQ tablet, Take 10 mEq by mouth daily. , Disp: , Rfl:  .  simvastatin (ZOCOR) 20 MG tablet, Take 1 tablet by mouth Daily., Disp: , Rfl:  .  tiotropium (SPIRIVA) 18 MCG inhalation capsule, Place 1 capsule (18 mcg total) into inhaler and inhale daily., Disp: 30 capsule, Rfl: 6 .  tiZANidine (ZANAFLEX) 2 MG tablet, Take 1 tablet by mouth 2 (two) times daily as needed for muscle spasms. , Disp: , Rfl:  .  traMADol (ULTRAM) 50 MG tablet, Take 50 mg by mouth daily as needed for moderate pain. , Disp: , Rfl:  .  zolpidem (AMBIEN) 5 MG tablet, Take 5 mg by mouth at bedtime., Disp: , Rfl:   Allergies: No Known Allergies  Past Medical History, Surgical history, Social history, and Family History were reviewed and updated.  Review of Systems: As above  Physical Exam:  height is 5\' 7"  (1.702 m) and weight is 202 lb (91.627 kg). His oral temperature is 98.2 F (36.8 C). His blood pressure is 134/88 and his pulse is 74. His respiration is 18.   Well-developed and well-nourished African American gentleman. Head and neck exam shows no ocular or oral lesions. He has no adenopathy. Lungs are clear. Cardiac exam is regular rate and rhythm. There are no murmurs, rubs or bruits. Abdomen is soft. Has good bowel sounds. There is no fluid wave. There is no palpable liver or spleen tip. Back exam shows no tenderness over the spine ribs or hips. Extremities shows no clubbing, cyanosis or edema. Skin exam shows no rashes, ecchymoses or petechia. Neurological exam is nonfocal.  Lab Results  Component Value Date     WBC 5.7 08/20/2014   HGB 16.0 08/20/2014   HCT 48.6 08/20/2014   MCV 90 08/20/2014   PLT 291 08/20/2014     Chemistry      Component Value Date/Time   NA 144 08/20/2014 0818   NA 144 08/04/2013 1028   K 3.3 08/20/2014 0818   K 3.5 08/04/2013 1028   CL 102 08/20/2014 0818   CL 107 08/04/2013 1028   CO2 30 08/20/2014 0818   CO2 26 08/04/2013 1028   BUN 7 08/20/2014 0818   BUN 8 08/04/2013 1028   CREATININE 0.9 08/20/2014 0818   CREATININE 0.88 08/04/2013 1028      Component Value Date/Time   CALCIUM 8.9 08/20/2014 0818   CALCIUM 8.7 08/04/2013 1028   ALKPHOS 84 08/20/2014 0818   ALKPHOS 79 08/04/2013 1028   AST 31 08/20/2014 0818   AST 21 08/04/2013 1028   ALT 33 08/20/2014 0818   ALT 17 08/04/2013 1028   BILITOT 0.70 08/20/2014 0818   BILITOT 0.3 08/04/2013 1028     Ferritin is 604 Iron saturation is 36 %. Total iron is 94    Impression and Plan: James Zuniga is 59 year old African-American male. He has multifactorial anemia. We have taken care of this nicely. He gets IV iron on occasion although we have not had to give him any for several months.  We will go ahead and give him a testosterone shot today. I think this is helping him more than anything.  From my point of view, I'll plan to get him back in about 4-6 weeks. I think the testosterone injection is helping him.  Spent about 35 minutes with he and his wife. I was just trying to go through all the medicines that is taken and trying to figure out how his medication regimen can be simplified . Volanda Napoleon, MD 2/8/20165:16 PM

## 2014-08-21 ENCOUNTER — Other Ambulatory Visit: Payer: Self-pay | Admitting: Family

## 2014-09-03 ENCOUNTER — Telehealth: Payer: Self-pay | Admitting: Hematology & Oncology

## 2014-09-03 NOTE — Telephone Encounter (Signed)
[employer] sent a letter saying as a role to monitor the appropriateness of paid medical claims and verify adherence to standard billing procedures, we request assistance with compliance review for this patient.   Please submit medical records for [facility] 03/15/2014.   Send to:  [facility]           ATTN: RETROSPECTIVE REVIEW    [id number]               [place] [address]                 (sent via mail today to this pob)                 or      [facility]          ATTN: RETROSPECTIVE REVIEW  [address], [address] [address]

## 2014-09-10 ENCOUNTER — Ambulatory Visit (HOSPITAL_BASED_OUTPATIENT_CLINIC_OR_DEPARTMENT_OTHER): Payer: PPO

## 2014-09-10 ENCOUNTER — Telehealth: Payer: Self-pay | Admitting: Hematology & Oncology

## 2014-09-10 DIAGNOSIS — D509 Iron deficiency anemia, unspecified: Secondary | ICD-10-CM

## 2014-09-10 DIAGNOSIS — E291 Testicular hypofunction: Secondary | ICD-10-CM

## 2014-09-10 MED ORDER — TESTOSTERONE CYPIONATE 200 MG/ML IM SOLN
400.0000 mg | Freq: Once | INTRAMUSCULAR | Status: AC
  Administered 2014-09-10: 400 mg via INTRAMUSCULAR

## 2014-09-10 MED ORDER — TESTOSTERONE CYPIONATE 200 MG/ML IM SOLN
INTRAMUSCULAR | Status: AC
  Filled 2014-09-10: qty 2

## 2014-09-10 NOTE — Patient Instructions (Signed)

## 2014-09-10 NOTE — Telephone Encounter (Signed)
SILVERBACK / Willette Brace: [id number] Treating Provider: Dr. Burney Gauze Visits: 12 Status: Approved Dates: 07/30/2014-01/27/2015 J1071 Testosterone Cypionate

## 2014-10-01 ENCOUNTER — Other Ambulatory Visit (HOSPITAL_BASED_OUTPATIENT_CLINIC_OR_DEPARTMENT_OTHER): Payer: PPO | Admitting: Lab

## 2014-10-01 ENCOUNTER — Ambulatory Visit (HOSPITAL_BASED_OUTPATIENT_CLINIC_OR_DEPARTMENT_OTHER): Payer: PPO

## 2014-10-01 ENCOUNTER — Encounter: Payer: Self-pay | Admitting: Family

## 2014-10-01 ENCOUNTER — Ambulatory Visit (HOSPITAL_BASED_OUTPATIENT_CLINIC_OR_DEPARTMENT_OTHER): Payer: PPO | Admitting: Family

## 2014-10-01 VITALS — BP 131/95 | HR 100 | Temp 98.4°F | Resp 20 | Ht 67.0 in | Wt 205.0 lb

## 2014-10-01 DIAGNOSIS — E349 Endocrine disorder, unspecified: Secondary | ICD-10-CM

## 2014-10-01 DIAGNOSIS — M1A40X0 Other secondary chronic gout, unspecified site, without tophus (tophi): Secondary | ICD-10-CM

## 2014-10-01 DIAGNOSIS — R5382 Chronic fatigue, unspecified: Secondary | ICD-10-CM

## 2014-10-01 DIAGNOSIS — D509 Iron deficiency anemia, unspecified: Secondary | ICD-10-CM

## 2014-10-01 DIAGNOSIS — E291 Testicular hypofunction: Secondary | ICD-10-CM

## 2014-10-01 LAB — CBC WITH DIFFERENTIAL (CANCER CENTER ONLY)
BASO#: 0 10*3/uL (ref 0.0–0.2)
BASO%: 0.5 % (ref 0.0–2.0)
EOS%: 4.4 % (ref 0.0–7.0)
Eosinophils Absolute: 0.3 10*3/uL (ref 0.0–0.5)
HCT: 49.1 % (ref 38.7–49.9)
HEMOGLOBIN: 16.6 g/dL (ref 13.0–17.1)
LYMPH#: 2.2 10*3/uL (ref 0.9–3.3)
LYMPH%: 35.1 % (ref 14.0–48.0)
MCH: 29.8 pg (ref 28.0–33.4)
MCHC: 33.8 g/dL (ref 32.0–35.9)
MCV: 88 fL (ref 82–98)
MONO#: 0.4 10*3/uL (ref 0.1–0.9)
MONO%: 7 % (ref 0.0–13.0)
NEUT%: 53 % (ref 40.0–80.0)
NEUTROS ABS: 3.3 10*3/uL (ref 1.5–6.5)
Platelets: 283 10*3/uL (ref 145–400)
RBC: 5.57 10*6/uL (ref 4.20–5.70)
RDW: 15.2 % (ref 11.1–15.7)
WBC: 6.2 10*3/uL (ref 4.0–10.0)

## 2014-10-01 LAB — IRON AND TIBC CHCC
%SAT: 36 % (ref 20–55)
Iron: 97 ug/dL (ref 42–163)
TIBC: 265 ug/dL (ref 202–409)
UIBC: 168 ug/dL (ref 117–376)

## 2014-10-01 LAB — CMP (CANCER CENTER ONLY)
ALBUMIN: 3.6 g/dL (ref 3.3–5.5)
ALT(SGPT): 33 U/L (ref 10–47)
AST: 29 U/L (ref 11–38)
Alkaline Phosphatase: 105 U/L — ABNORMAL HIGH (ref 26–84)
BUN: 6 mg/dL — AB (ref 7–22)
CHLORIDE: 106 meq/L (ref 98–108)
CO2: 30 mEq/L (ref 18–33)
Calcium: 9.7 mg/dL (ref 8.0–10.3)
Creat: 0.9 mg/dl (ref 0.6–1.2)
GLUCOSE: 102 mg/dL (ref 73–118)
POTASSIUM: 3.4 meq/L (ref 3.3–4.7)
Sodium: 142 mEq/L (ref 128–145)
Total Bilirubin: 0.6 mg/dl (ref 0.20–1.60)
Total Protein: 8.1 g/dL (ref 6.4–8.1)

## 2014-10-01 LAB — FERRITIN CHCC: FERRITIN: 473 ng/mL — AB (ref 22–316)

## 2014-10-01 MED ORDER — TESTOSTERONE CYPIONATE 200 MG/ML IM SOLN
400.0000 mg | Freq: Once | INTRAMUSCULAR | Status: AC
Start: 2014-10-01 — End: 2014-10-01
  Administered 2014-10-01: 400 mg via INTRAMUSCULAR

## 2014-10-01 MED ORDER — TESTOSTERONE CYPIONATE 200 MG/ML IM SOLN
INTRAMUSCULAR | Status: AC
  Filled 2014-10-01: qty 2

## 2014-10-01 NOTE — Progress Notes (Signed)
Hematology and Oncology Follow Up Visit  James Zuniga [id number] October 20, 1955 59 y.o. 10/01/2014   Principle Diagnosis:  Iron deficiency anemia Hypotestosteronemia Chronic liver lesions  Current Therapy:   IV iron as indicated Depo- testosterone 400 mg IM Q3 weeks    Interim History: James Zuniga is here today for a follow-up. He is feeling a little better.  He has some SOB with exertion dues to COPD and emphysema. This is still the same. He wears 2L [place] at night time.  He denies fever, chills, n/v, cough, rash, headache, dizziness, blurred vision, chest pain, palpitations, abdominal pain, constipation, diarrhea, blood in urine or stool. He has had no swelling, tenderness, numbness or tingling in his extremities. 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 He is trying to walk some to get a little exercise.  His appetite is good and he is staying hydrated. His weight is stable. He last had Fereheme in September and did well with it.  Medications:    Medication List       This list is accurate as of: 10/01/14 11:01 AM.  Always use your most recent med list.               allopurinol 300 MG tablet  Commonly known as:  ZYLOPRIM  Take 300 mg by mouth daily.     amLODipine 5 MG tablet  Commonly known as:  NORVASC  Take 5 mg by mouth daily.     aspirin 81 MG EC tablet  Take 81 mg by mouth daily. Swallow whole.     budesonide-formoterol 80-4.5 MCG/ACT inhaler  Commonly known as:  SYMBICORT  Inhale 2 puffs into the lungs 2 (two) times daily.     diclofenac sodium 1 % Gel  Commonly known as:  VOLTAREN  APPLY 4 GRAMS TO KNEES THREE TIMES DAILY AS NEEDED     furosemide 20 MG tablet  Commonly known as:  LASIX  Take 1 tablet by mouth 2 (two) times daily.     gabapentin 300 MG capsule  Commonly known as:  NEURONTIN  Take 300 mg by mouth 3 (three) times daily.     losartan 50 MG tablet  Commonly known as:  COZAAR  Take 50 mg by mouth daily.     metoprolol  tartrate 25 MG tablet  Commonly known as:  LOPRESSOR  Take 25 mg by mouth daily.     omeprazole 20 MG capsule  Commonly known as:  PRILOSEC  Take 20 mg by mouth daily.     potassium chloride 10 MEQ tablet  Commonly known as:  K-DUR,KLOR-CON  Take 10 mEq by mouth daily.     simvastatin 20 MG tablet  Commonly known as:  ZOCOR  Take 1 tablet by mouth Daily.     tiotropium 18 MCG inhalation capsule  Commonly known as:  SPIRIVA  Place 1 capsule (18 mcg total) into inhaler and inhale daily.     tiZANidine 2 MG tablet  Commonly known as:  ZANAFLEX  Take 1 tablet by mouth 2 (two) times daily as needed for muscle spasms.     traMADol 50 MG tablet  Commonly known as:  ULTRAM  Take 50 mg by mouth daily as needed for moderate pain.     zolpidem 5 MG tablet  Commonly known as:  AMBIEN  Take 5 mg by mouth at bedtime.        Allergies: No Known Allergies  Past Medical History, Surgical history, Social history, and  Family History were reviewed and updated.  Review of Systems: All other 10 point review of systems is negative.   Physical Exam:  height is 5\' 7"  (1.702 m) and weight is 205 lb (92.987 kg). His oral temperature is 98.4 F (36.9 C). His blood pressure is 131/95 and his pulse is 100. His respiration is 20.   Wt Readings from Last 3 Encounters:  10/01/14 205 lb (92.987 kg)  08/20/14 202 lb (91.627 kg)  07/09/14 199 lb (90.266 kg)    Ocular: Sclerae unicteric, pupils equal, round and reactive to light Ear-nose-throat: Oropharynx clear, dentition fair Lymphatic: No cervical or supraclavicular adenopathy Lungs no rales or rhonchi, good excursion bilaterally Heart regular rate and rhythm, no murmur appreciated Abd soft, nontender, positive bowel sounds MSK no focal spinal tenderness, no joint edema Neuro: non-focal, well-oriented, appropriate affect  Lab Results  Component Value Date   WBC 6.2 10/01/2014   HGB 16.6 10/01/2014   HCT 49.1 10/01/2014   MCV 88  10/01/2014   PLT 283 10/01/2014   Lab Results  Component Value Date   FERRITIN 604* 08/20/2014   IRON 94 08/20/2014   TIBC 260 08/20/2014   UIBC 166 08/20/2014   IRONPCTSAT 36 08/20/2014   Lab Results  Component Value Date   RETICCTPCT 1.2 08/20/2014   RBC 5.57 10/01/2014   RETICCTABS 64.0 08/20/2014   No results found for: KPAFRELGTCHN, LAMBDASER, KAPLAMBRATIO No results found for: IGGSERUM, IGA, IGMSERUM No results found for: Odetta Pink, SPEI   Chemistry      Component Value Date/Time   NA 144 08/20/2014 0818   NA 144 08/04/2013 1028   K 3.3 08/20/2014 0818   K 3.5 08/04/2013 1028   CL 102 08/20/2014 0818   CL 107 08/04/2013 1028   CO2 30 08/20/2014 0818   CO2 26 08/04/2013 1028   BUN 7 08/20/2014 0818   BUN 8 08/04/2013 1028   CREATININE 0.9 08/20/2014 0818   CREATININE 0.88 08/04/2013 1028      Component Value Date/Time   CALCIUM 8.9 08/20/2014 0818   CALCIUM 8.7 08/04/2013 1028   ALKPHOS 84 08/20/2014 0818   ALKPHOS 79 08/04/2013 1028   AST 31 08/20/2014 0818   AST 21 08/04/2013 1028   ALT 33 08/20/2014 0818   ALT 17 08/04/2013 1028   BILITOT 0.70 08/20/2014 0818   BILITOT 0.3 08/04/2013 1028     Impression and Plan: James Zuniga is a pleasant 59 year old African American male with multifactorial anemia and hypotestosteronemia. He is symptomatic with fatigue.    He will continue getting his testosterone injections every 3 weeks.  His CBC was normal. We will see what his iron studies show.  We will see him back in 6 weeks for labs and follow-up.  He knows to call here with any questions or concerns and to go to the ED in the event of an emergency. We can certainly see him sooner if need be.   [name], NP 3/21/201611:01 AM

## 2014-10-01 NOTE — Patient Instructions (Signed)

## 2014-10-02 LAB — RETICULOCYTES (CHCC)
ABS Retic: 66.4 10*3/uL (ref 19.0–186.0)
RBC.: 5.53 MIL/uL (ref 4.22–5.81)
RETIC CT PCT: 1.2 % (ref 0.4–2.3)

## 2014-10-02 LAB — TESTOSTERONE: TESTOSTERONE: 360 ng/dL (ref 300–890)

## 2014-10-02 LAB — VITAMIN D 25 HYDROXY (VIT D DEFICIENCY, FRACTURES): Vit D, 25-Hydroxy: 10 ng/mL — ABNORMAL LOW (ref 30–100)

## 2014-10-12 ENCOUNTER — Telehealth: Payer: Self-pay | Admitting: Internal Medicine

## 2014-10-12 NOTE — Telephone Encounter (Signed)
lmtcb x1 

## 2014-10-12 NOTE — Telephone Encounter (Signed)
lmtcb x2 

## 2014-10-12 NOTE — Telephone Encounter (Signed)
Please cb at previous number listed

## 2014-10-15 NOTE — Telephone Encounter (Signed)
Called and spoke to pt. Pt questioned if he needs to be on nocturnal O2. Advised pt (per last PN 04/06/2014) he does not need nocturnal O2. Pt stated his breathing is unchanged since last OV. Pt has current OV with ME in 11/2014. Pt aware to contact us sooner if breathing worsens.

## 2014-10-22 ENCOUNTER — Ambulatory Visit (HOSPITAL_BASED_OUTPATIENT_CLINIC_OR_DEPARTMENT_OTHER): Payer: PPO

## 2014-10-22 DIAGNOSIS — E291 Testicular hypofunction: Secondary | ICD-10-CM | POA: Diagnosis not present

## 2014-10-22 DIAGNOSIS — D509 Iron deficiency anemia, unspecified: Secondary | ICD-10-CM

## 2014-10-22 MED ORDER — TESTOSTERONE CYPIONATE 200 MG/ML IM SOLN
INTRAMUSCULAR | Status: AC
  Filled 2014-10-22: qty 2

## 2014-10-22 MED ORDER — TESTOSTERONE CYPIONATE 200 MG/ML IM SOLN
400.0000 mg | Freq: Once | INTRAMUSCULAR | Status: AC
  Administered 2014-10-22: 400 mg via INTRAMUSCULAR

## 2014-10-22 NOTE — Patient Instructions (Signed)

## 2014-11-07 ENCOUNTER — Telehealth: Payer: Self-pay | Admitting: Hematology & Oncology

## 2014-11-07 NOTE — Telephone Encounter (Signed)
Pt moved 5-2 to 4-29

## 2014-11-09 ENCOUNTER — Ambulatory Visit (HOSPITAL_BASED_OUTPATIENT_CLINIC_OR_DEPARTMENT_OTHER): Payer: PPO

## 2014-11-09 ENCOUNTER — Ambulatory Visit (HOSPITAL_BASED_OUTPATIENT_CLINIC_OR_DEPARTMENT_OTHER): Payer: PPO | Admitting: Hematology & Oncology

## 2014-11-09 ENCOUNTER — Encounter: Payer: Self-pay | Admitting: Hematology & Oncology

## 2014-11-09 ENCOUNTER — Other Ambulatory Visit (HOSPITAL_BASED_OUTPATIENT_CLINIC_OR_DEPARTMENT_OTHER): Payer: PPO

## 2014-11-09 VITALS — BP 141/100 | HR 73 | Temp 98.3°F | Resp 20 | Ht 66.0 in | Wt 205.0 lb

## 2014-11-09 VITALS — BP 126/88 | HR 88

## 2014-11-09 DIAGNOSIS — D509 Iron deficiency anemia, unspecified: Secondary | ICD-10-CM

## 2014-11-09 DIAGNOSIS — E291 Testicular hypofunction: Secondary | ICD-10-CM

## 2014-11-09 DIAGNOSIS — E559 Vitamin D deficiency, unspecified: Secondary | ICD-10-CM

## 2014-11-09 DIAGNOSIS — E349 Endocrine disorder, unspecified: Secondary | ICD-10-CM

## 2014-11-09 DIAGNOSIS — L0201 Cutaneous abscess of face: Secondary | ICD-10-CM

## 2014-11-09 DIAGNOSIS — K769 Liver disease, unspecified: Secondary | ICD-10-CM

## 2014-11-09 LAB — CBC WITH DIFFERENTIAL (CANCER CENTER ONLY)
BASO#: 0 10*3/uL (ref 0.0–0.2)
BASO%: 0.5 % (ref 0.0–2.0)
EOS ABS: 0.2 10*3/uL (ref 0.0–0.5)
EOS%: 3.4 % (ref 0.0–7.0)
HEMATOCRIT: 47.6 % (ref 38.7–49.9)
HGB: 16 g/dL (ref 13.0–17.1)
LYMPH#: 2 10*3/uL (ref 0.9–3.3)
LYMPH%: 29.8 % (ref 14.0–48.0)
MCH: 30.4 pg (ref 28.0–33.4)
MCHC: 33.6 g/dL (ref 32.0–35.9)
MCV: 91 fL (ref 82–98)
MONO#: 0.4 10*3/uL (ref 0.1–0.9)
MONO%: 5.7 % (ref 0.0–13.0)
NEUT#: 4 10*3/uL (ref 1.5–6.5)
NEUT%: 60.6 % (ref 40.0–80.0)
PLATELETS: 310 10*3/uL (ref 145–400)
RBC: 5.26 10*6/uL (ref 4.20–5.70)
RDW: 15.3 % (ref 11.1–15.7)
WBC: 6.5 10*3/uL (ref 4.0–10.0)

## 2014-11-09 MED ORDER — DOXYCYCLINE HYCLATE 100 MG PO TABS: 100.0000 mg | ORAL_TABLET | Freq: Two times a day (BID) | ORAL | Status: DC

## 2014-11-09 MED ORDER — TESTOSTERONE CYPIONATE 200 MG/ML IM SOLN
INTRAMUSCULAR | Status: AC
  Filled 2014-11-09: qty 2

## 2014-11-09 MED ORDER — TESTOSTERONE CYPIONATE 200 MG/ML IM SOLN
400.0000 mg | Freq: Once | INTRAMUSCULAR | Status: AC
  Administered 2014-11-09: 400 mg via INTRAMUSCULAR

## 2014-11-09 NOTE — Patient Instructions (Signed)

## 2014-11-09 NOTE — Progress Notes (Signed)
Hematology and Oncology Follow Up Visit  JIOVANI MCCAMMON [id number] 04/05/1956 59 y.o. 11/09/2014   Principle Diagnosis:   Iron deficiency anemia  hypotestosteronemia  Chronic liver lesions  Current Therapy:    IV iron as indicated  Depo- testosterone 300mg  IM Q2 weeks     Interim History:  Mr.  Mcelhannon is back for followup. He says he is feeling little tired. He thinks that the testosterone is helping him but every 3 weeks might be a little bit too long. As such, we will try every couple weeks.  He sees a pulmonologist on Monday. Hopefully they can optimize his lung function.  He developed a small abscess on the left side of his face just at the angle of his mouth. I went ahead and opened this up a little bit and a lot of purulent material came out. I cleaned it with some out call. I put some neomycin ointment on. I'll give me prescription for doxycycline.  His liver since we don't pretty well.  He's not had a have any iron for a while.Marland Kitchen  He's not had any swallowing problems. He's had no nausea or vomiting.  Medications:  Current outpatient prescriptions:  .  allopurinol (ZYLOPRIM) 300 MG tablet, Take 300 mg by mouth daily., Disp: , Rfl:  .  amLODipine (NORVASC) 5 MG tablet, Take 5 mg by mouth daily., Disp: , Rfl:  .  aspirin 81 MG EC tablet, Take 81 mg by mouth daily. Swallow whole., Disp: , Rfl:  .  budesonide-formoterol (SYMBICORT) 80-4.5 MCG/ACT inhaler, Inhale 2 puffs into the lungs 2 (two) times daily., Disp: 3 Inhaler, Rfl: 3 .  diclofenac sodium (VOLTAREN) 1 % GEL, Apply topically., Disp: , Rfl:  .  furosemide (LASIX) 20 MG tablet, Take 1 tablet by mouth 2 (two) times daily., Disp: , Rfl:  .  gabapentin (NEURONTIN) 300 MG capsule, TAKE 1 CAPSULE BY MOUTH THREE TIMES DAILY, Disp: , Rfl:  .  losartan (COZAAR) 50 MG tablet, Take 50 mg by mouth daily. , Disp: , Rfl:  .  metoprolol tartrate (LOPRESSOR) 25 MG tablet, Take 25 mg by mouth daily. , Disp: , Rfl:  .  omeprazole  (PRILOSEC) 20 MG capsule, Take 20 mg by mouth daily. , Disp: , Rfl: 0 .  potassium chloride (K-DUR,KLOR-CON) 10 MEQ tablet, Take 10 mEq by mouth daily. , Disp: , Rfl:  .  sildenafil (VIAGRA) 100 MG tablet, Take 100 mg by mouth., Disp: , Rfl:  .  simvastatin (ZOCOR) 20 MG tablet, Take 1 tablet by mouth Daily., Disp: , Rfl:  .  tiotropium (SPIRIVA) 18 MCG inhalation capsule, Place 1 capsule (18 mcg total) into inhaler and inhale daily., Disp: 30 capsule, Rfl: 6 .  tiZANidine (ZANAFLEX) 4 MG tablet, 4 mg 3 (three) times daily as needed., Disp: , Rfl: 0 .  traMADol (ULTRAM) 50 MG tablet, Take 50 mg by mouth daily as needed for moderate pain. , Disp: , Rfl:  .  zolpidem (AMBIEN) 5 MG tablet, Take 5 mg by mouth at bedtime., Disp: , Rfl:  .  doxycycline (VIBRA-TABS) 100 MG tablet, Take 1 tablet (100 mg total) by mouth 2 (two) times daily., Disp: 20 tablet, Rfl: 0 No current facility-administered medications for this visit.  Facility-Administered Medications Ordered in Other Visits:  .  testosterone cypionate (DEPOTESTOTERONE CYPIONATE) injection 400 mg, 400 mg, Intramuscular, Once, Volanda Napoleon, MD  Allergies: No Known Allergies  Past Medical History, Surgical history, Social history, and Family History were reviewed and  updated.  Review of Systems: As above  Physical Exam:  height is 5\' 6"  (1.676 m) and weight is 205 lb (92.987 kg). His oral temperature is 98.3 F (36.8 C). His blood pressure is 141/100 and his pulse is 73. His respiration is 20.   Well-developed and well-nourished African American gentleman. Head and neck exam shows no ocular or oral lesions. He does have a bit of firmness and slight tenderness in about a centimeter area just to the left of the angle of his mouth. This is where he has this abscess. He has no adenopathy. Lungs are clear. Cardiac exam is regular rate and rhythm. There are no murmurs, rubs or bruits. Abdomen is soft. Has good bowel sounds. There is no fluid wave.  There is no palpable liver or spleen tip. Back exam shows no tenderness over the spine ribs or hips. Extremities shows no clubbing, cyanosis or edema. Skin exam shows no rashes, ecchymoses or petechia. Neurological exam is nonfocal.  Lab Results  Component Value Date   WBC 6.5 11/09/2014   HGB 16.0 11/09/2014   HCT 47.6 11/09/2014   MCV 91 11/09/2014   PLT 310 11/09/2014     Chemistry      Component Value Date/Time   NA 142 10/01/2014 1038   NA 144 08/04/2013 1028   K 3.4 10/01/2014 1038   K 3.5 08/04/2013 1028   CL 106 10/01/2014 1038   CL 107 08/04/2013 1028   CO2 30 10/01/2014 1038   CO2 26 08/04/2013 1028   BUN 6* 10/01/2014 1038   BUN 8 08/04/2013 1028   CREATININE 0.9 10/01/2014 1038   CREATININE 0.88 08/04/2013 1028      Component Value Date/Time   CALCIUM 9.7 10/01/2014 1038   CALCIUM 8.7 08/04/2013 1028   ALKPHOS 105* 10/01/2014 1038   ALKPHOS 79 08/04/2013 1028   AST 29 10/01/2014 1038   AST 21 08/04/2013 1028   ALT 33 10/01/2014 1038   ALT 17 08/04/2013 1028   BILITOT 0.60 10/01/2014 1038   BILITOT 0.3 08/04/2013 1028         Impression and Plan: Mr. Reitter is 59 year old African-American male. He has multifactorial anemia. We have taken care of this nicely. He gets IV iron on occasion although we have not had to give him any for several months.  We will go ahead and give him a testosterone shot today. I think this is helping him more than anything. I will try to move his testosterone up to every 2 week dosing.  Again, we will see how doxycycline works for him. I'm sure that the pulmonologist can take a look at this area on his face when he is seen on Monday.  I noted that his vitamin D was quite low. I told him to take 2000 units a day of over-the-counter vitamin D  Volanda Napoleon, MD 4/29/20162:47 PM

## 2014-11-11 LAB — COMPREHENSIVE METABOLIC PANEL
ALBUMIN: 4.1 g/dL (ref 3.5–5.2)
ALK PHOS: 102 U/L (ref 39–117)
ALT: 29 U/L (ref 0–53)
AST: 23 U/L (ref 0–37)
BUN: 7 mg/dL (ref 6–23)
CO2: 27 mEq/L (ref 19–32)
Calcium: 9.3 mg/dL (ref 8.4–10.5)
Chloride: 100 mEq/L (ref 96–112)
Creatinine, Ser: 0.94 mg/dL (ref 0.50–1.35)
Glucose, Bld: 111 mg/dL — ABNORMAL HIGH (ref 70–99)
Potassium: 3.5 mEq/L (ref 3.5–5.3)
Sodium: 142 mEq/L (ref 135–145)
TOTAL PROTEIN: 7.5 g/dL (ref 6.0–8.3)
Total Bilirubin: 0.5 mg/dL (ref 0.2–1.2)

## 2014-11-11 LAB — TESTOSTERONE: Testosterone: 488 ng/dL (ref 300–890)

## 2014-11-11 LAB — RETICULOCYTES (CHCC)
ABS Retic: 68.1 10*3/uL (ref 19.0–186.0)
RBC.: 5.24 MIL/uL (ref 4.22–5.81)
RETIC CT PCT: 1.3 % (ref 0.4–2.3)

## 2014-11-11 LAB — VITAMIN D 25 HYDROXY (VIT D DEFICIENCY, FRACTURES): Vit D, 25-Hydroxy: 10 ng/mL — ABNORMAL LOW (ref 30–100)

## 2014-11-12 ENCOUNTER — Ambulatory Visit: Payer: Self-pay

## 2014-11-12 ENCOUNTER — Encounter: Payer: Self-pay | Admitting: Internal Medicine

## 2014-11-12 ENCOUNTER — Other Ambulatory Visit: Payer: Self-pay

## 2014-11-12 ENCOUNTER — Ambulatory Visit (INDEPENDENT_AMBULATORY_CARE_PROVIDER_SITE_OTHER): Payer: PPO | Admitting: Internal Medicine

## 2014-11-12 ENCOUNTER — Ambulatory Visit: Payer: Self-pay | Admitting: Hematology & Oncology

## 2014-11-12 ENCOUNTER — Other Ambulatory Visit: Payer: PPO

## 2014-11-12 VITALS — BP 142/98 | HR 79 | Ht 66.0 in | Wt 208.0 lb

## 2014-11-12 DIAGNOSIS — J42 Unspecified chronic bronchitis: Secondary | ICD-10-CM

## 2014-11-12 DIAGNOSIS — Z720 Tobacco use: Secondary | ICD-10-CM | POA: Diagnosis not present

## 2014-11-12 DIAGNOSIS — R0600 Dyspnea, unspecified: Secondary | ICD-10-CM

## 2014-11-12 DIAGNOSIS — F17200 Nicotine dependence, unspecified, uncomplicated: Secondary | ICD-10-CM

## 2014-11-12 DIAGNOSIS — R0689 Other abnormalities of breathing: Secondary | ICD-10-CM

## 2014-11-12 DIAGNOSIS — R911 Solitary pulmonary nodule: Secondary | ICD-10-CM

## 2014-11-12 DIAGNOSIS — J449 Chronic obstructive pulmonary disease, unspecified: Secondary | ICD-10-CM | POA: Insufficient documentation

## 2014-11-12 LAB — FERRITIN CHCC: FERRITIN: 539 ng/mL — AB (ref 22–316)

## 2014-11-12 LAB — IRON AND TIBC CHCC
%SAT: 33 % (ref 20–55)
IRON: 89 ug/dL (ref 42–163)
TIBC: 271 ug/dL (ref 202–409)
UIBC: 182 ug/dL (ref 117–376)

## 2014-11-12 MED ORDER — FLUTICASONE FUROATE-VILANTEROL 100-25 MCG/INH IN AEPB: 1.0000 | INHALATION_SPRAY | Freq: Every day | RESPIRATORY_TRACT | Status: DC

## 2014-11-12 MED ORDER — ALBUTEROL SULFATE HFA 108 (90 BASE) MCG/ACT IN AERS: 2.0000 | INHALATION_SPRAY | Freq: Four times a day (QID) | RESPIRATORY_TRACT | Status: DC | PRN

## 2014-11-12 NOTE — Progress Notes (Signed)
Subjective:    Patient ID: James Zuniga, male    DOB: November 05, 1955, 59 y.o.   MRN: [id number]  HPI   OV 11/12/2014  Chief Complaint  Patient presents with  . Follow-up    Pt states his breathing has worsened since last OV. pt c/o increase in SOB, dry cough and CP with activity.     COPD  (no nocturnal desat sept 2015) :   No interim flare up. LAst seen Sept 2015. Using spriiva and symbicort and a compliant fashion. However, he is reporting worsening shortness of breath in the last 4 months. It is moderate to severe in intensity. It is class III in severity. Relieved by rest. No associated chest pain or wheezing or cough. He has mild occasional sputum that is brown in color but no change. He is not reporting any other symptoms of COPD exacerbation. He continues to live a sedentary lifestyle. Of note he has been on testosterone for the last several months to a year. Most recent hemoglobin was 16 g percent [lab test personally reviewed). There is no associated syncope. Oxygen desaturation test in September 2015 was normal. We have advised him there was no need for oxygen use. He is really frustrated by his worsening dyspnea. He wants to change his inhalers around. Spiriva and Symbicort are helping but he wants to try better one. Reportedly normal cardiac workup with [place] cardiologist  Smoking:   reports that he quit smoking about 4 months ago. His smoking use included Cigarettes. He started smoking about 31 years ago. He has a 30 pack-year smoking history. He has never used smokeless tobacco.    Lung nodule 80mm - stable aug 2014 and 2015. Next is September 2016  has a past medical history of CHF (congestive heart failure); Coronary artery disease; Diabetes mellitus; Hypertension; Renal disorder; Hyperlipidemia; Iron deficiency anemia, unspecified (07/01/2013); and Hypotestosteronism (07/01/2013).   reports that he quit smoking about 4 months ago. His smoking use included Cigarettes. He  started smoking about 31 years ago. He has a 30 pack-year smoking history. He has never used smokeless tobacco.  Past Surgical History  Procedure Laterality Date  . No past surgeries      No Known Allergies  Immunization History  Administered Date(s) Administered  . Influenza Split 04/12/2012, 05/13/2013  . Influenza,inj,Quad PF,36+ Mos 04/02/2014    Family History  Problem Relation Age of Onset  . Heart disease Mother   . Stroke Brother   . Diabetes Father      Current outpatient prescriptions:  .  allopurinol (ZYLOPRIM) 300 MG tablet, Take 300 mg by mouth daily., Disp: , Rfl:  .  amLODipine (NORVASC) 5 MG tablet, Take 5 mg by mouth daily., Disp: , Rfl:  .  aspirin 81 MG EC tablet, Take 81 mg by mouth daily. Swallow whole., Disp: , Rfl:  .  budesonide-formoterol (SYMBICORT) 80-4.5 MCG/ACT inhaler, Inhale 2 puffs into the lungs 2 (two) times daily., Disp: 3 Inhaler, Rfl: 3 .  diclofenac sodium (VOLTAREN) 1 % GEL, Apply topically., Disp: , Rfl:  .  furosemide (LASIX) 20 MG tablet, Take 1 tablet by mouth 2 (two) times daily., Disp: , Rfl:  .  gabapentin (NEURONTIN) 300 MG capsule, TAKE 1 CAPSULE BY MOUTH THREE TIMES DAILY, Disp: , Rfl:  .  losartan (COZAAR) 50 MG tablet, Take 50 mg by mouth daily. , Disp: , Rfl:  .  metoprolol tartrate (LOPRESSOR) 25 MG tablet, Take 25 mg by mouth daily. , Disp: ,  Rfl:  .  omeprazole (PRILOSEC) 20 MG capsule, Take 20 mg by mouth daily. , Disp: , Rfl: 0 .  potassium chloride (K-DUR,KLOR-CON) 10 MEQ tablet, Take 10 mEq by mouth daily. , Disp: , Rfl:  .  sildenafil (VIAGRA) 100 MG tablet, Take 100 mg by mouth., Disp: , Rfl:  .  simvastatin (ZOCOR) 20 MG tablet, Take 1 tablet by mouth Daily., Disp: , Rfl:  .  tiotropium (SPIRIVA) 18 MCG inhalation capsule, Place 1 capsule (18 mcg total) into inhaler and inhale daily., Disp: 30 capsule, Rfl: 6 .  tiZANidine (ZANAFLEX) 4 MG tablet, 4 mg 3 (three) times daily as needed., Disp: , Rfl: 0 .  traMADol  (ULTRAM) 50 MG tablet, Take 50 mg by mouth daily as needed for moderate pain. , Disp: , Rfl:  .  zolpidem (AMBIEN) 5 MG tablet, Take 5 mg by mouth at bedtime., Disp: , Rfl:     Review of Systems  Constitutional: Negative for fever and unexpected weight change.  HENT: Negative for congestion, dental problem, ear pain, nosebleeds, postnasal drip, rhinorrhea, sinus pressure, sneezing, sore throat and trouble swallowing.   Eyes: Negative for redness and itching.  Respiratory: Positive for cough and shortness of breath. Negative for chest tightness and wheezing.   Cardiovascular: Positive for chest pain. Negative for palpitations and leg swelling.  Gastrointestinal: Negative for nausea and vomiting.  Genitourinary: Negative for dysuria.  Musculoskeletal: Negative for joint swelling.  Skin: Negative for rash.  Neurological: Negative for headaches.  Hematological: Does not bruise/bleed easily.  Psychiatric/Behavioral: Negative for dysphoric mood. The patient is not nervous/anxious.        Objective:   Physical Exam  Filed Vitals:   11/12/14 0912  BP: 142/98  Pulse: 79  Height: 5\' 6"  (1.676 m)  Weight: 208 lb (94.348 kg)  SpO2: 96%     Physical Exam Nursing note and vitals reviewed. Constitutional: He is oriented to person, place, and time. He appears well-developed and well-nourished. No distress.  HENT:  Head: Normocephalic and atraumatic.  Right Ear: External ear normal.  Left Ear: External ear normal.  Mouth/Throat: Oropharynx is clear and moist. No oropharyngeal exudate.  Eyes: Conjunctivae normal and EOM are normal. Pupils are equal, round, and reactive to light. Right eye exhibits no discharge. Left eye exhibits no discharge. No scleral icterus.  Neck: Normal range of motion. Neck supple. No JVD present. No tracheal deviation present. No thyromegaly present.  Cardiovascular: Normal rate, regular rhythm and intact distal pulses.  Exam reveals no gallop and no friction rub.     No murmur heard. Pulmonary/Chest: Effort normal and breath sounds normal. No respiratory distress. He has no wheezes. He has no rales. He exhibits no tenderness.  Abdominal: Soft. Bowel sounds are normal. He exhibits no distension and no mass. There is no tenderness. There is no rebound and no guarding.  Musculoskeletal: Normal range of motion. He exhibits no edema and no tenderness.       Walks with cane , antalgic gaitf from chronic back pain Lymphadenopathy:    He has no cervical adenopathy.  Neurological: He is alert and oriented to person, place, and time. He has normal reflexes. No cranial nerve deficit. Coordination normal.  Skin: Skin is warm and dry. No rash noted. He is not diaphoretic. No erythema. No pallor.  Psychiatric: FLAT AFFECT      Assessment & Plan:     ICD-9-CM ICD-10-CM   1. Dyspnea and respiratory abnormality 786.09 R06.00 D-Dimer, Quantitative  R06.89   2. Smoking 305.1 Z72.0 D-Dimer, Quantitative  3. Pulmonary nodule, right 33mm Aug 2014 793.11 R91.1   4. Chronic bronchitis, unspecified chronic bronchitis type 491.9 J42     Glad he has quit smoking but he continues to have worsening dyspnea. COPD itself appears stable without exacerbation. However he has worsening dyspnea. Apparently his cardiac workup is normal according to his history with a [place] cardiologist. In the context of testosterone use I would like to rule out a pulmonary embolism. Therefore will do d-dimer. If d-dimer is abnormal we'll proceed with a VQ scan or CT scan of the chest. I suspect another big scheme of things that he is probably deconditioned and this is what is making his dyspnea worse  In the interim at his request we'll change his inhalers. We'll switch his Symbicort to Brio. We'll continue Spiriva once daily.  He will need a CT scan of the chest anyway by September 2016 for his nodules this could change if he was to have a CT scan for rule out PE   Dr. Brand Males, M.D.,  [facility].C.P Pulmonary and Critical Care Medicine Staff Physician [facility] Pager: [phone], If no answer or between  15:00h - 7:00h: call [phone]  11/12/2014 9:33 AM

## 2014-11-12 NOTE — Patient Instructions (Addendum)
ICD-9-CM ICD-10-CM   1. Dyspnea and respiratory abnormality 786.09 R06.00     R06.89   2. Smoking 305.1 Z72.0   3. Pulmonary nodule, right 87mm Aug 2014 793.11 R91.1   4. Chronic bronchitis, unspecified chronic bronchitis type 491.9 J42      #Worsening shortness of breath  - do d-dimer blood test   - if abnormal, wil lorder VQ scan  - if normal, do  Inhaler change as below and reassess with  PFT sept 2016  #COPD -stable - change symbicort to BREO 1 puff daily at night - Continue spiriva 1 puff daily  - do albuterol 2 puff as needed   #Lung nodule 70mm Aug 2014 and August 2015  - fu CT ches September 2016; there should be a prior order  #SMoking  glad you quit smoking  #Followup  - depending on d-dimer blood test

## 2014-11-12 NOTE — Addendum Note (Signed)
Addended by: Maurice March on: 11/12/2014 05:03 PM   Modules accepted: Orders

## 2014-11-13 ENCOUNTER — Telehealth: Payer: Self-pay | Admitting: Internal Medicine

## 2014-11-13 LAB — D-DIMER, QUANTITATIVE: D-Dimer, Quant: 0.47 ug/mL-FEU (ref 0.00–0.48)

## 2014-11-13 NOTE — Telephone Encounter (Signed)
Pt requesting lab results from 11/12/14 (D-Dimer).  Results in EMR - Please advise

## 2014-11-13 NOTE — Telephone Encounter (Signed)
D-dimer normal. So no PE, So follow plan of change inhalers and pft at [facility]

## 2014-11-13 NOTE — Telephone Encounter (Signed)
I spoke with patient about results and he verbalized understanding and had no questions 

## 2014-11-23 ENCOUNTER — Ambulatory Visit (HOSPITAL_BASED_OUTPATIENT_CLINIC_OR_DEPARTMENT_OTHER): Payer: PPO

## 2014-11-23 VITALS — BP 135/98 | HR 94 | Temp 97.6°F | Resp 16 | Wt 206.0 lb

## 2014-11-23 DIAGNOSIS — E291 Testicular hypofunction: Secondary | ICD-10-CM | POA: Diagnosis not present

## 2014-11-23 DIAGNOSIS — D509 Iron deficiency anemia, unspecified: Secondary | ICD-10-CM

## 2014-11-23 MED ORDER — TESTOSTERONE CYPIONATE 200 MG/ML IM SOLN
300.0000 mg | INTRAMUSCULAR | Status: DC
  Administered 2014-11-23: 300 mg via INTRAMUSCULAR

## 2014-11-23 MED ORDER — TESTOSTERONE CYPIONATE 200 MG/ML IM SOLN
INTRAMUSCULAR | Status: AC
  Filled 2014-11-23: qty 2

## 2014-11-23 NOTE — Patient Instructions (Signed)

## 2014-12-07 ENCOUNTER — Ambulatory Visit (HOSPITAL_BASED_OUTPATIENT_CLINIC_OR_DEPARTMENT_OTHER): Payer: PPO

## 2014-12-07 VITALS — BP 137/96 | HR 110 | Temp 97.9°F | Resp 18

## 2014-12-07 DIAGNOSIS — E291 Testicular hypofunction: Secondary | ICD-10-CM | POA: Diagnosis not present

## 2014-12-07 DIAGNOSIS — D509 Iron deficiency anemia, unspecified: Secondary | ICD-10-CM

## 2014-12-07 MED ORDER — TESTOSTERONE CYPIONATE 200 MG/ML IM SOLN
INTRAMUSCULAR | Status: AC
  Filled 2014-12-07: qty 2

## 2014-12-07 MED ORDER — TESTOSTERONE CYPIONATE 200 MG/ML IM SOLN
300.0000 mg | INTRAMUSCULAR | Status: DC
  Administered 2014-12-07: 300 mg via INTRAMUSCULAR

## 2014-12-07 NOTE — Patient Instructions (Signed)

## 2014-12-21 ENCOUNTER — Ambulatory Visit (HOSPITAL_BASED_OUTPATIENT_CLINIC_OR_DEPARTMENT_OTHER): Payer: PPO | Admitting: Family

## 2014-12-21 ENCOUNTER — Other Ambulatory Visit (HOSPITAL_BASED_OUTPATIENT_CLINIC_OR_DEPARTMENT_OTHER): Payer: PPO

## 2014-12-21 ENCOUNTER — Encounter: Payer: Self-pay | Admitting: Family

## 2014-12-21 ENCOUNTER — Ambulatory Visit (HOSPITAL_BASED_OUTPATIENT_CLINIC_OR_DEPARTMENT_OTHER): Payer: PPO

## 2014-12-21 VITALS — BP 127/84 | HR 100 | Temp 98.0°F | Resp 18 | Ht 66.0 in | Wt 207.0 lb

## 2014-12-21 DIAGNOSIS — E349 Endocrine disorder, unspecified: Secondary | ICD-10-CM

## 2014-12-21 DIAGNOSIS — E291 Testicular hypofunction: Secondary | ICD-10-CM

## 2014-12-21 DIAGNOSIS — D509 Iron deficiency anemia, unspecified: Secondary | ICD-10-CM

## 2014-12-21 DIAGNOSIS — D649 Anemia, unspecified: Secondary | ICD-10-CM

## 2014-12-21 DIAGNOSIS — L0201 Cutaneous abscess of face: Secondary | ICD-10-CM

## 2014-12-21 LAB — CBC WITH DIFFERENTIAL (CANCER CENTER ONLY)
BASO#: 0 10*3/uL (ref 0.0–0.2)
BASO%: 0.5 % (ref 0.0–2.0)
EOS%: 4.7 % (ref 0.0–7.0)
Eosinophils Absolute: 0.3 10*3/uL (ref 0.0–0.5)
HCT: 45.8 % (ref 38.7–49.9)
HGB: 15.2 g/dL (ref 13.0–17.1)
LYMPH#: 1.6 10*3/uL (ref 0.9–3.3)
LYMPH%: 25.9 % (ref 14.0–48.0)
MCH: 31 pg (ref 28.0–33.4)
MCHC: 33.2 g/dL (ref 32.0–35.9)
MCV: 93 fL (ref 82–98)
MONO#: 0.3 10*3/uL (ref 0.1–0.9)
MONO%: 4.5 % (ref 0.0–13.0)
NEUT#: 4 10*3/uL (ref 1.5–6.5)
NEUT%: 64.4 % (ref 40.0–80.0)
Platelets: 294 10*3/uL (ref 145–400)
RBC: 4.91 10*6/uL (ref 4.20–5.70)
RDW: 15.9 % — ABNORMAL HIGH (ref 11.1–15.7)
WBC: 6.2 10*3/uL (ref 4.0–10.0)

## 2014-12-21 LAB — CMP (CANCER CENTER ONLY)
ALK PHOS: 98 U/L — AB (ref 26–84)
ALT(SGPT): 32 U/L (ref 10–47)
AST: 28 U/L (ref 11–38)
Albumin: 3.4 g/dL (ref 3.3–5.5)
BUN: 8 mg/dL (ref 7–22)
CALCIUM: 9.2 mg/dL (ref 8.0–10.3)
CO2: 30 meq/L (ref 18–33)
CREATININE: 1.3 mg/dL — AB (ref 0.6–1.2)
Chloride: 97 mEq/L — ABNORMAL LOW (ref 98–108)
GLUCOSE: 143 mg/dL — AB (ref 73–118)
Potassium: 3.3 mEq/L (ref 3.3–4.7)
Sodium: 134 mEq/L (ref 128–145)
Total Bilirubin: 0.8 mg/dl (ref 0.20–1.60)
Total Protein: 7.2 g/dL (ref 6.4–8.1)

## 2014-12-21 LAB — IRON AND TIBC CHCC
%SAT: 30 % (ref 20–55)
IRON: 76 ug/dL (ref 42–163)
TIBC: 250 ug/dL (ref 202–409)
UIBC: 174 ug/dL (ref 117–376)

## 2014-12-21 LAB — CHCC SATELLITE - SMEAR

## 2014-12-21 LAB — FERRITIN CHCC: Ferritin: 499 ng/ml — ABNORMAL HIGH (ref 22–316)

## 2014-12-21 MED ORDER — TESTOSTERONE CYPIONATE 200 MG/ML IM SOLN
300.0000 mg | INTRAMUSCULAR | Status: DC
  Administered 2014-12-21: 300 mg via INTRAMUSCULAR

## 2014-12-21 MED ORDER — TESTOSTERONE CYPIONATE 200 MG/ML IM SOLN
INTRAMUSCULAR | Status: AC
  Filled 2014-12-21: qty 2

## 2014-12-21 MED ORDER — DOXYCYCLINE HYCLATE 100 MG PO TABS: 100.0000 mg | ORAL_TABLET | Freq: Two times a day (BID) | ORAL | Status: DC

## 2014-12-21 NOTE — Patient Instructions (Signed)

## 2014-12-21 NOTE — Progress Notes (Signed)
Hematology and Oncology Follow Up Visit  AYSON CHERUBINI [id number] 1955/07/26 59 y.o. 12/21/2014   Principle Diagnosis:  Iron deficiency anemia Hypotestosteronemia Chronic liver lesions  Current Therapy:   IV iron as indicated Depo- testosterone 400 mg IM Q3 weeks    Interim History: Mr. Zeiss is here today for a follow-up. He is feeling fatigued today.  He denies fever, chills, n/v, cough, rash, headache, dizziness, blurred vision, chest pain, palpitations, abdominal pain, constipation, diarrhea, blood in urine or stool. He does get SOB some with exertion. He has COPD. He has seen pulmonology and has started a new inhaler that is helping. He uses 2L O2 at night.  He has had no swelling, tenderness, numbness or tingling in his extremities. He does have arthritis that flares up now and then.  The abscess on the left side of his face has healed nicely. He now has what appears to be an infected area, quarter sized, on the lower left abdomen with cellulitis. There is no drainage but it is painful to touch. We will have him take doxycycline BID for 10 days.  His appetite is good and he is staying hydrated. His weight is stable.  He is still walking for exercise.   Medications:    Medication List       This list is accurate as of: 12/21/14  2:03 PM.  Always use your most recent med list.               albuterol 108 (90 BASE) MCG/ACT inhaler  Commonly known as:  PROVENTIL HFA;VENTOLIN HFA  Inhale 2 puffs into the lungs every 6 (six) hours as needed for wheezing or shortness of breath.     allopurinol 300 MG tablet  Commonly known as:  ZYLOPRIM  Take 300 mg by mouth daily.     amLODipine 5 MG tablet  Commonly known as:  NORVASC  Take 5 mg by mouth daily.     aspirin 81 MG EC tablet  Take 81 mg by mouth daily. Swallow whole.     BREO ELLIPTA 100-25 MCG/INH Aepb  Generic drug:  Fluticasone Furoate-Vilanterol  INL 1 PUFF PO D     diclofenac sodium 1 % Gel  Commonly known as:   VOLTAREN  Apply topically.     doxycycline 100 MG tablet  Commonly known as:  VIBRA-TABS  Take 1 tablet (100 mg total) by mouth 2 (two) times daily.     furosemide 20 MG tablet  Commonly known as:  LASIX  Take 1 tablet by mouth 2 (two) times daily.     gabapentin 300 MG capsule  Commonly known as:  NEURONTIN  TAKE 1 CAPSULE BY MOUTH THREE TIMES DAILY     losartan 50 MG tablet  Commonly known as:  COZAAR  Take 50 mg by mouth daily.     metoprolol tartrate 25 MG tablet  Commonly known as:  LOPRESSOR  Take 25 mg by mouth daily.     omeprazole 20 MG capsule  Commonly known as:  PRILOSEC  Take 20 mg by mouth daily.     potassium chloride 10 MEQ tablet  Commonly known as:  K-DUR,KLOR-CON  Take 10 mEq by mouth daily.     sildenafil 100 MG tablet  Commonly known as:  VIAGRA  Take 100 mg by mouth.     simvastatin 20 MG tablet  Commonly known as:  ZOCOR  Take 1 tablet by mouth Daily.     tiotropium 18 MCG inhalation capsule  Commonly known as:  SPIRIVA  Place 1 capsule (18 mcg total) into inhaler and inhale daily.     tiZANidine 4 MG tablet  Commonly known as:  ZANAFLEX  4 mg 3 (three) times daily as needed.     traMADol 50 MG tablet  Commonly known as:  ULTRAM  Take 50 mg by mouth daily as needed for moderate pain.     zolpidem 5 MG tablet  Commonly known as:  AMBIEN  Take 5 mg by mouth at bedtime.        Allergies: No Known Allergies  Past Medical History, Surgical history, Social history, and Family History were reviewed and updated.  Review of Systems: All other 10 point review of systems is negative.   Physical Exam:  height is 5\' 6"  (1.676 m) and weight is 207 lb (93.895 kg). His oral temperature is 98 F (36.7 C). His blood pressure is 127/84 and his pulse is 100. His respiration is 18.   Wt Readings from Last 3 Encounters:  12/21/14 207 lb (93.895 kg)  11/23/14 206 lb (93.441 kg)  11/12/14 208 lb (94.348 kg)    Ocular: Sclerae unicteric, pupils  equal, round and reactive to light Ear-nose-throat: Oropharynx clear, dentition fair Lymphatic: No cervical or supraclavicular adenopathy Lungs no rales or rhonchi, good excursion bilaterally Heart regular rate and rhythm, no murmur appreciated Abd soft, nontender, positive bowel sounds MSK no focal spinal tenderness, no joint edema Neuro: non-focal, well-oriented, appropriate affect  Lab Results  Component Value Date   WBC 6.2 12/21/2014   HGB 15.2 12/21/2014   HCT 45.8 12/21/2014   MCV 93 12/21/2014   PLT 294 12/21/2014   Lab Results  Component Value Date   FERRITIN 499* 12/21/2014   IRON 76 12/21/2014   TIBC 250 12/21/2014   UIBC 174 12/21/2014   IRONPCTSAT 30 12/21/2014   Lab Results  Component Value Date   RETICCTPCT 1.3 11/09/2014   RBC 4.91 12/21/2014   RETICCTABS 68.1 11/09/2014   No results found for: KPAFRELGTCHN, LAMBDASER, KAPLAMBRATIO No results found for: IGGSERUM, IGA, IGMSERUM No results found for: Odetta Pink, SPEI   Chemistry      Component Value Date/Time   NA 134 12/21/2014 0857   NA 142 11/09/2014 1343   K 3.3 12/21/2014 0857   K 3.5 11/09/2014 1343   CL 97* 12/21/2014 0857   CL 100 11/09/2014 1343   CO2 30 12/21/2014 0857   CO2 27 11/09/2014 1343   BUN 8 12/21/2014 0857   BUN 7 11/09/2014 1343   CREATININE 1.3* 12/21/2014 0857   CREATININE 0.94 11/09/2014 1343      Component Value Date/Time   CALCIUM 9.2 12/21/2014 0857   CALCIUM 9.3 11/09/2014 1343   ALKPHOS 98* 12/21/2014 0857   ALKPHOS 102 11/09/2014 1343   AST 28 12/21/2014 0857   AST 23 11/09/2014 1343   ALT 32 12/21/2014 0857   ALT 29 11/09/2014 1343   BILITOT 0.80 12/21/2014 0857   BILITOT 0.5 11/09/2014 1343     Impression and Plan: Mr. Bigley is a pleasant 59 year old African American male with multifactorial anemia and hypotestosteronemia. He is symptomatic with fatigue.    He will continue getting his testosterone  injections every 3 weeks.  His LFT's today look good. No anemia and his iron saturation is 30%.  We will see him back in 6 weeks for labs and follow-up.  We will continue giving him testosterone every 2 weeks.  He  will take doxycycline 100 mg BID for 10 days for his area of cellulitis on the abdomen.  He knows to call here with any questions or concerns. We can certainly see him sooner if need be.   [name], NP 6/10/20162:03 PM

## 2014-12-22 LAB — RETICULOCYTES (CHCC)
ABS RETIC: 78.9 10*3/uL (ref 19.0–186.0)
RBC.: 4.93 MIL/uL (ref 4.22–5.81)
Retic Ct Pct: 1.6 % (ref 0.4–2.3)

## 2014-12-22 LAB — TESTOSTERONE: Testosterone: 448 ng/dL (ref 300–890)

## 2015-01-10 ENCOUNTER — Ambulatory Visit (HOSPITAL_BASED_OUTPATIENT_CLINIC_OR_DEPARTMENT_OTHER): Payer: PPO

## 2015-01-10 VITALS — BP 131/84 | HR 95 | Temp 98.3°F | Resp 20

## 2015-01-10 DIAGNOSIS — D509 Iron deficiency anemia, unspecified: Secondary | ICD-10-CM

## 2015-01-10 DIAGNOSIS — E291 Testicular hypofunction: Secondary | ICD-10-CM

## 2015-01-10 MED ORDER — TESTOSTERONE CYPIONATE 200 MG/ML IM SOLN
INTRAMUSCULAR | Status: AC
  Filled 2015-01-10: qty 2

## 2015-01-10 MED ORDER — TESTOSTERONE CYPIONATE 200 MG/ML IM SOLN
300.0000 mg | INTRAMUSCULAR | Status: DC
  Administered 2015-01-10: 300 mg via INTRAMUSCULAR

## 2015-01-10 NOTE — Patient Instructions (Signed)

## 2015-01-18 ENCOUNTER — Ambulatory Visit: Payer: PPO

## 2015-01-25 ENCOUNTER — Ambulatory Visit (HOSPITAL_BASED_OUTPATIENT_CLINIC_OR_DEPARTMENT_OTHER): Payer: PPO

## 2015-01-25 VITALS — BP 123/83 | HR 99 | Temp 98.3°F | Resp 16 | Ht 66.0 in | Wt 204.0 lb

## 2015-01-25 DIAGNOSIS — E291 Testicular hypofunction: Secondary | ICD-10-CM | POA: Diagnosis not present

## 2015-01-25 DIAGNOSIS — D509 Iron deficiency anemia, unspecified: Secondary | ICD-10-CM

## 2015-01-25 MED ORDER — TESTOSTERONE CYPIONATE 200 MG/ML IM SOLN
INTRAMUSCULAR | Status: AC
Start: 2015-01-25 — End: 2015-01-25
  Filled 2015-01-25: qty 2

## 2015-01-25 MED ORDER — TESTOSTERONE CYPIONATE 200 MG/ML IM SOLN
300.0000 mg | INTRAMUSCULAR | Status: DC
  Administered 2015-01-25: 300 mg via INTRAMUSCULAR

## 2015-01-25 NOTE — Patient Instructions (Signed)

## 2015-02-01 ENCOUNTER — Ambulatory Visit: Payer: PPO

## 2015-02-06 ENCOUNTER — Telehealth: Payer: Self-pay | Admitting: Hematology & Oncology

## 2015-02-06 NOTE — Telephone Encounter (Signed)
[employer] sent a request back saying the code below did not require prior auth.  However, it did in the past.    J1071 Testosterone Cypionate    P: [phone]   COPY SCANNED

## 2015-02-08 ENCOUNTER — Ambulatory Visit (HOSPITAL_BASED_OUTPATIENT_CLINIC_OR_DEPARTMENT_OTHER): Payer: PPO

## 2015-02-08 DIAGNOSIS — E291 Testicular hypofunction: Secondary | ICD-10-CM | POA: Diagnosis not present

## 2015-02-08 DIAGNOSIS — D509 Iron deficiency anemia, unspecified: Secondary | ICD-10-CM

## 2015-02-08 MED ORDER — TESTOSTERONE CYPIONATE 200 MG/ML IM SOLN
INTRAMUSCULAR | Status: AC
  Filled 2015-02-08: qty 2

## 2015-02-08 MED ORDER — TESTOSTERONE CYPIONATE 200 MG/ML IM SOLN
300.0000 mg | INTRAMUSCULAR | Status: DC
  Administered 2015-02-08: 300 mg via INTRAMUSCULAR

## 2015-02-08 NOTE — Patient Instructions (Signed)

## 2015-02-15 ENCOUNTER — Ambulatory Visit: Payer: Self-pay | Admitting: Family

## 2015-02-15 ENCOUNTER — Ambulatory Visit: Payer: Self-pay

## 2015-02-15 ENCOUNTER — Other Ambulatory Visit: Payer: Self-pay

## 2015-02-22 ENCOUNTER — Ambulatory Visit (HOSPITAL_BASED_OUTPATIENT_CLINIC_OR_DEPARTMENT_OTHER): Payer: PPO

## 2015-02-22 ENCOUNTER — Ambulatory Visit (HOSPITAL_BASED_OUTPATIENT_CLINIC_OR_DEPARTMENT_OTHER): Payer: PPO | Admitting: Family

## 2015-02-22 ENCOUNTER — Encounter: Payer: Self-pay | Admitting: Family

## 2015-02-22 ENCOUNTER — Other Ambulatory Visit (HOSPITAL_BASED_OUTPATIENT_CLINIC_OR_DEPARTMENT_OTHER): Payer: PPO

## 2015-02-22 VITALS — BP 123/83 | HR 84 | Temp 97.9°F | Ht 66.0 in | Wt 203.0 lb

## 2015-02-22 DIAGNOSIS — E291 Testicular hypofunction: Secondary | ICD-10-CM

## 2015-02-22 DIAGNOSIS — E349 Endocrine disorder, unspecified: Secondary | ICD-10-CM

## 2015-02-22 DIAGNOSIS — D509 Iron deficiency anemia, unspecified: Secondary | ICD-10-CM

## 2015-02-22 LAB — CBC WITH DIFFERENTIAL (CANCER CENTER ONLY)
BASO#: 0 10*3/uL (ref 0.0–0.2)
BASO%: 0.4 % (ref 0.0–2.0)
EOS%: 3.7 % (ref 0.0–7.0)
Eosinophils Absolute: 0.3 10*3/uL (ref 0.0–0.5)
HCT: 45.5 % (ref 38.7–49.9)
HGB: 15.3 g/dL (ref 13.0–17.1)
LYMPH#: 2 10*3/uL (ref 0.9–3.3)
LYMPH%: 29.6 % (ref 14.0–48.0)
MCH: 31.7 pg (ref 28.0–33.4)
MCHC: 33.6 g/dL (ref 32.0–35.9)
MCV: 94 fL (ref 82–98)
MONO#: 0.4 10*3/uL (ref 0.1–0.9)
MONO%: 5.8 % (ref 0.0–13.0)
NEUT#: 4.1 10*3/uL (ref 1.5–6.5)
NEUT%: 60.5 % (ref 40.0–80.0)
PLATELETS: 338 10*3/uL (ref 145–400)
RBC: 4.82 10*6/uL (ref 4.20–5.70)
RDW: 15.7 % (ref 11.1–15.7)
WBC: 6.7 10*3/uL (ref 4.0–10.0)

## 2015-02-22 LAB — CHCC SATELLITE - SMEAR

## 2015-02-22 MED ORDER — TESTOSTERONE CYPIONATE 200 MG/ML IM SOLN
300.0000 mg | INTRAMUSCULAR | Status: DC
  Administered 2015-02-22: 300 mg via INTRAMUSCULAR

## 2015-02-22 MED ORDER — TESTOSTERONE CYPIONATE 200 MG/ML IM SOLN
INTRAMUSCULAR | Status: AC
  Filled 2015-02-22: qty 2

## 2015-02-22 NOTE — Patient Instructions (Signed)

## 2015-02-22 NOTE — Progress Notes (Signed)
Hematology and Oncology Follow Up Visit  James Zuniga [id number] 16-Sep-1955 59 y.o. 02/22/2015   Principle Diagnosis:  Iron deficiency anemia Hypotestosteronemia Chronic liver lesions  Current Therapy:   IV iron as indicated Depotestosterone 400 mg IM Q2 weeks    Interim History: James Zuniga is here today for a follow-up. He is feeling much better since increasing his testosterone injections to every 2 weeks, His energy is much improved.  He denies fever, chills, n/v, cough, rash, headache, dizziness, blurred vision, chest pain, palpitations, abdominal pain, changes in bowel or bladder habits. He has not noticed any blood in his urine or stool.  He does get SOB some with exertion due to COPD. He is followed by pulmonology and uses 2L O2 at night as needed.  No swelling or tenderness in his extremities. He states that he has a pinched nerve in his back and has some numbness and tingling in the left leg from time to time. This has not effected his gait and he has had no falls.   His appetite is ok and he is staying hydrated. He says that food doesn't always taste good to him. His weight is stable. He is still walking for exercise.   Medications:    Medication List       This list is accurate as of: 02/22/15  8:54 AM.  Always use your most recent med list.               albuterol 108 (90 BASE) MCG/ACT inhaler  Commonly known as:  PROVENTIL HFA;VENTOLIN HFA  Inhale 2 puffs into the lungs every 6 (six) hours as needed for wheezing or shortness of breath.     allopurinol 300 MG tablet  Commonly known as:  ZYLOPRIM  Take 300 mg by mouth daily.     amLODipine 10 MG tablet  Commonly known as:  NORVASC  10 mg daily.     aspirin 81 MG EC tablet  Take 81 mg by mouth daily. Swallow whole.     BREO ELLIPTA 100-25 MCG/INH Aepb  Generic drug:  Fluticasone Furoate-Vilanterol  INL 1 PUFF PO D     CYMBALTA 60 MG capsule  Generic drug:  DULoxetine  Take one daily     diclofenac  sodium 1 % Gel  Commonly known as:  VOLTAREN  Apply topically.     doxycycline 100 MG tablet  Commonly known as:  VIBRA-TABS  Take 1 tablet (100 mg total) by mouth 2 (two) times daily.     furosemide 20 MG tablet  Commonly known as:  LASIX  Take 1 tablet by mouth 2 (two) times daily.     gabapentin 300 MG capsule  Commonly known as:  NEURONTIN  TAKE 1 CAPSULE BY MOUTH THREE TIMES DAILY     losartan 50 MG tablet  Commonly known as:  COZAAR  Take 50 mg by mouth daily.     metoprolol tartrate 25 MG tablet  Commonly known as:  LOPRESSOR  Take 25 mg by mouth daily.     omeprazole 20 MG capsule  Commonly known as:  PRILOSEC  Take 20 mg by mouth daily.     potassium chloride 10 MEQ tablet  Commonly known as:  K-DUR,KLOR-CON  Take 10 mEq by mouth daily.     simvastatin 20 MG tablet  Commonly known as:  ZOCOR  Take 1 tablet by mouth Daily.     tiotropium 18 MCG inhalation capsule  Commonly known as:  [place] 1  capsule (18 mcg total) into inhaler and inhale daily.     tiZANidine 4 MG tablet  Commonly known as:  ZANAFLEX  4 mg 3 (three) times daily as needed.     traMADol 50 MG tablet  Commonly known as:  ULTRAM  Take 50 mg by mouth daily as needed for moderate pain.     Vitamin D3 1000 UNITS Caps  Take by mouth.     ZOLOFT 50 MG tablet  Generic drug:  sertraline  50 mg.     zolpidem 5 MG tablet  Commonly known as:  AMBIEN  Take 5 mg by mouth at bedtime.        Allergies: No Known Allergies  Past Medical History, Surgical history, Social history, and Family History were reviewed and updated.  Review of Systems: All other 10 point review of systems is negative.   Physical Exam:  vitals were not taken for this visit.  Wt Readings from Last 3 Encounters:  01/25/15 204 lb (92.534 kg)  12/21/14 207 lb (93.895 kg)  11/23/14 206 lb (93.441 kg)    Ocular: Sclerae unicteric, pupils equal, round and reactive to light Ear-nose-throat: Oropharynx clear,  dentition fair Lymphatic: No cervical or supraclavicular adenopathy Lungs no rales or rhonchi, good excursion bilaterally Heart regular rate and rhythm, no murmur appreciated Abd soft, nontender, positive bowel sounds MSK no focal spinal tenderness, no joint edema Neuro: non-focal, well-oriented, appropriate affect  Lab Results  Component Value Date   WBC 6.7 02/22/2015   HGB 15.3 02/22/2015   HCT 45.5 02/22/2015   MCV 94 02/22/2015   PLT 338 02/22/2015   Lab Results  Component Value Date   FERRITIN 499* 12/21/2014   IRON 76 12/21/2014   TIBC 250 12/21/2014   UIBC 174 12/21/2014   IRONPCTSAT 30 12/21/2014   Lab Results  Component Value Date   RETICCTPCT 1.6 12/21/2014   RBC 4.82 02/22/2015   RETICCTABS 78.9 12/21/2014   No results found for: KPAFRELGTCHN, LAMBDASER, KAPLAMBRATIO No results found for: IGGSERUM, IGA, IGMSERUM No results found for: Odetta Pink, SPEI   Chemistry      Component Value Date/Time   NA 134 12/21/2014 0857   NA 142 11/09/2014 1343   K 3.3 12/21/2014 0857   K 3.5 11/09/2014 1343   CL 97* 12/21/2014 0857   CL 100 11/09/2014 1343   CO2 30 12/21/2014 0857   CO2 27 11/09/2014 1343   BUN 8 12/21/2014 0857   BUN 7 11/09/2014 1343   CREATININE 1.3* 12/21/2014 0857   CREATININE 0.94 11/09/2014 1343      Component Value Date/Time   CALCIUM 9.2 12/21/2014 0857   CALCIUM 9.3 11/09/2014 1343   ALKPHOS 98* 12/21/2014 0857   ALKPHOS 102 11/09/2014 1343   AST 28 12/21/2014 0857   AST 23 11/09/2014 1343   ALT 32 12/21/2014 0857   ALT 29 11/09/2014 1343   BILITOT 0.80 12/21/2014 0857   BILITOT 0.5 11/09/2014 1343     Impression and Plan: James Zuniga is a pleasant 59 yo African American male with multifactorial anemia and hypotestosteronemia. He is doing well and is asymptomatic at this time.  He will continue getting his testosterone injections every 2 weeks.  His CBC today looks good. Other  labs are still pending.  We will see him back in 2 months for labs and follow-up.   He knows to call here with any questions or concerns. We can certainly see him sooner if  need be.   [name], NP 8/12/20168:54 AM

## 2015-03-08 ENCOUNTER — Ambulatory Visit (HOSPITAL_BASED_OUTPATIENT_CLINIC_OR_DEPARTMENT_OTHER): Payer: PPO

## 2015-03-08 VITALS — BP 146/92 | HR 98

## 2015-03-08 DIAGNOSIS — E291 Testicular hypofunction: Secondary | ICD-10-CM | POA: Diagnosis not present

## 2015-03-08 DIAGNOSIS — D509 Iron deficiency anemia, unspecified: Secondary | ICD-10-CM

## 2015-03-08 MED ORDER — TESTOSTERONE CYPIONATE 200 MG/ML IM SOLN
300.0000 mg | INTRAMUSCULAR | Status: DC
  Administered 2015-03-08: 300 mg via INTRAMUSCULAR

## 2015-03-08 MED ORDER — TESTOSTERONE CYPIONATE 200 MG/ML IM SOLN
INTRAMUSCULAR | Status: AC
  Filled 2015-03-08: qty 2

## 2015-03-08 NOTE — Patient Instructions (Signed)

## 2015-03-22 ENCOUNTER — Ambulatory Visit (HOSPITAL_BASED_OUTPATIENT_CLINIC_OR_DEPARTMENT_OTHER): Payer: PPO

## 2015-03-22 VITALS — BP 136/84 | HR 92 | Temp 98.1°F | Resp 18

## 2015-03-22 DIAGNOSIS — E291 Testicular hypofunction: Secondary | ICD-10-CM

## 2015-03-22 DIAGNOSIS — D509 Iron deficiency anemia, unspecified: Secondary | ICD-10-CM

## 2015-03-22 MED ORDER — TESTOSTERONE CYPIONATE 200 MG/ML IM SOLN
INTRAMUSCULAR | Status: AC
  Filled 2015-03-22: qty 2

## 2015-03-22 MED ORDER — TESTOSTERONE CYPIONATE 200 MG/ML IM SOLN
300.0000 mg | INTRAMUSCULAR | Status: DC
  Administered 2015-03-22: 300 mg via INTRAMUSCULAR

## 2015-03-22 NOTE — Patient Instructions (Signed)

## 2015-04-05 ENCOUNTER — Ambulatory Visit (HOSPITAL_BASED_OUTPATIENT_CLINIC_OR_DEPARTMENT_OTHER): Payer: PPO

## 2015-04-05 VITALS — BP 111/65 | HR 100 | Temp 98.0°F | Resp 18

## 2015-04-05 DIAGNOSIS — E291 Testicular hypofunction: Secondary | ICD-10-CM

## 2015-04-05 DIAGNOSIS — D509 Iron deficiency anemia, unspecified: Secondary | ICD-10-CM

## 2015-04-05 MED ORDER — TESTOSTERONE CYPIONATE 200 MG/ML IM SOLN
300.0000 mg | INTRAMUSCULAR | Status: DC
  Administered 2015-04-05: 300 mg via INTRAMUSCULAR

## 2015-04-05 MED ORDER — TESTOSTERONE CYPIONATE 200 MG/ML IM SOLN
INTRAMUSCULAR | Status: AC
  Filled 2015-04-05: qty 2

## 2015-04-17 ENCOUNTER — Institutional Professional Consult (permissible substitution): Payer: PPO | Admitting: Neurology

## 2015-04-19 ENCOUNTER — Ambulatory Visit (HOSPITAL_BASED_OUTPATIENT_CLINIC_OR_DEPARTMENT_OTHER): Payer: PPO

## 2015-04-19 VITALS — BP 123/74 | HR 95 | Temp 98.1°F | Resp 18

## 2015-04-19 DIAGNOSIS — D509 Iron deficiency anemia, unspecified: Secondary | ICD-10-CM

## 2015-04-19 DIAGNOSIS — E291 Testicular hypofunction: Secondary | ICD-10-CM | POA: Diagnosis not present

## 2015-04-19 MED ORDER — TESTOSTERONE CYPIONATE 200 MG/ML IM SOLN
INTRAMUSCULAR | Status: AC
  Filled 2015-04-19: qty 2

## 2015-04-19 MED ORDER — TESTOSTERONE CYPIONATE 200 MG/ML IM SOLN
300.0000 mg | INTRAMUSCULAR | Status: DC
  Administered 2015-04-19: 300 mg via INTRAMUSCULAR

## 2015-04-19 NOTE — Patient Instructions (Signed)

## 2015-05-03 ENCOUNTER — Telehealth: Payer: Self-pay

## 2015-05-03 ENCOUNTER — Ambulatory Visit (HOSPITAL_BASED_OUTPATIENT_CLINIC_OR_DEPARTMENT_OTHER): Payer: PPO

## 2015-05-03 ENCOUNTER — Ambulatory Visit (HOSPITAL_BASED_OUTPATIENT_CLINIC_OR_DEPARTMENT_OTHER): Payer: PPO | Admitting: Hematology & Oncology

## 2015-05-03 ENCOUNTER — Other Ambulatory Visit (HOSPITAL_BASED_OUTPATIENT_CLINIC_OR_DEPARTMENT_OTHER): Payer: PPO

## 2015-05-03 ENCOUNTER — Encounter: Payer: Self-pay | Admitting: Hematology & Oncology

## 2015-05-03 ENCOUNTER — Other Ambulatory Visit: Payer: Self-pay

## 2015-05-03 VITALS — BP 137/85 | HR 96 | Temp 98.1°F | Resp 16 | Ht 66.0 in | Wt 209.0 lb

## 2015-05-03 DIAGNOSIS — E349 Endocrine disorder, unspecified: Secondary | ICD-10-CM

## 2015-05-03 DIAGNOSIS — E559 Vitamin D deficiency, unspecified: Secondary | ICD-10-CM

## 2015-05-03 DIAGNOSIS — D509 Iron deficiency anemia, unspecified: Secondary | ICD-10-CM

## 2015-05-03 DIAGNOSIS — Z720 Tobacco use: Secondary | ICD-10-CM | POA: Diagnosis not present

## 2015-05-03 DIAGNOSIS — M818 Other osteoporosis without current pathological fracture: Secondary | ICD-10-CM

## 2015-05-03 DIAGNOSIS — E291 Testicular hypofunction: Secondary | ICD-10-CM

## 2015-05-03 DIAGNOSIS — T387X5A Adverse effect of androgens and anabolic congeners, initial encounter: Secondary | ICD-10-CM

## 2015-05-03 LAB — CBC WITH DIFFERENTIAL (CANCER CENTER ONLY)
BASO#: 0 10*3/uL (ref 0.0–0.2)
BASO%: 0.3 % (ref 0.0–2.0)
EOS%: 2.8 % (ref 0.0–7.0)
Eosinophils Absolute: 0.2 10*3/uL (ref 0.0–0.5)
HCT: 48.7 % (ref 38.7–49.9)
HGB: 16 g/dL (ref 13.0–17.1)
LYMPH#: 2.2 10*3/uL (ref 0.9–3.3)
LYMPH%: 30.8 % (ref 14.0–48.0)
MCH: 30.7 pg (ref 28.0–33.4)
MCHC: 32.9 g/dL (ref 32.0–35.9)
MCV: 93 fL (ref 82–98)
MONO#: 0.4 10*3/uL (ref 0.1–0.9)
MONO%: 5.6 % (ref 0.0–13.0)
NEUT#: 4.3 10*3/uL (ref 1.5–6.5)
NEUT%: 60.5 % (ref 40.0–80.0)
PLATELETS: 337 10*3/uL (ref 145–400)
RBC: 5.22 10*6/uL (ref 4.20–5.70)
RDW: 14.9 % (ref 11.1–15.7)
WBC: 7 10*3/uL (ref 4.0–10.0)

## 2015-05-03 LAB — COMPREHENSIVE METABOLIC PANEL (CC13)
ALBUMIN: 3.7 g/dL (ref 3.5–5.0)
ALK PHOS: 104 U/L (ref 40–150)
ALT: 26 U/L (ref 0–55)
AST: 20 U/L (ref 5–34)
Anion Gap: 8 mEq/L (ref 3–11)
BILIRUBIN TOTAL: 0.45 mg/dL (ref 0.20–1.20)
BUN: 7.6 mg/dL (ref 7.0–26.0)
CALCIUM: 10 mg/dL (ref 8.4–10.4)
CO2: 30 mEq/L — ABNORMAL HIGH (ref 22–29)
Chloride: 104 mEq/L (ref 98–109)
Creatinine: 1.2 mg/dL (ref 0.7–1.3)
EGFR: 80 mL/min/{1.73_m2} — AB (ref >90)
GLUCOSE: 146 mg/dL — AB (ref 70–140)
Potassium: 2.9 mEq/L — CL (ref 3.5–5.1)
SODIUM: 142 meq/L (ref 136–145)
TOTAL PROTEIN: 7.8 g/dL (ref 6.4–8.3)

## 2015-05-03 LAB — IRON AND TIBC CHCC
%SAT: 39 % (ref 20–55)
IRON: 104 ug/dL (ref 42–163)
TIBC: 268 ug/dL (ref 202–409)
UIBC: 163 ug/dL (ref 117–376)

## 2015-05-03 LAB — CHCC SATELLITE - SMEAR

## 2015-05-03 LAB — FERRITIN CHCC: FERRITIN: 334 ng/mL — AB (ref 22–316)

## 2015-05-03 MED ORDER — POTASSIUM CHLORIDE CRYS ER 20 MEQ PO TBCR: 20.0000 meq | EXTENDED_RELEASE_TABLET | Freq: Two times a day (BID) | ORAL | Status: AC

## 2015-05-03 MED ORDER — TESTOSTERONE CYPIONATE 200 MG/ML IM SOLN
INTRAMUSCULAR | Status: AC
  Filled 2015-05-03: qty 2

## 2015-05-03 MED ORDER — TESTOSTERONE CYPIONATE 200 MG/ML IM SOLN
300.0000 mg | INTRAMUSCULAR | Status: DC
  Administered 2015-05-03: 300 mg via INTRAMUSCULAR

## 2015-05-03 NOTE — Progress Notes (Signed)
Hematology and Oncology Follow Up Visit  James Zuniga [id number] 23-Mar-1956 59 y.o. 05/03/2015   Principle Diagnosis:   Iron deficiency anemia  hypotestosteronemia  Chronic liver lesions  Current Therapy:    IV iron as indicated  Depo- testosterone 300mg  IM Q2 weeks     Interim History:  [name] is back for followup. 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 He has knee brace on his left knee. He is post have a brace for his right knee but this brace. Has not working too well.  He is seen with orthopedic surgery. He cannot remember the name of his orthopedist.  It sounds like he will need surgery.   The testosterone shot to help him quite a bit. He is feeling better with the testosterone shots.  He is back to smoking. I would show much she is smoking.  His appetite has been doing okay. He has had no problems with nausea or vomiting.  He continues on a bunch of medications. I think this is part of his problem.  His weight is up.  His last iron studies done back in June showed a ferritin of 499 with iron saturation of 30%.  His vitamin D level back in April was only 10. I told her to take extra vitamin D. I told her to take 2000 units a day of vitamin D. Hopefully he is doing this.  Medications:  Current outpatient prescriptions:  .  albuterol (PROVENTIL HFA;VENTOLIN HFA) 108 (90 BASE) MCG/ACT inhaler, Inhale 2 puffs into the lungs every 6 (six) hours as needed for wheezing or shortness of breath., Disp: 1 Inhaler, Rfl: 6 .  allopurinol (ZYLOPRIM) 300 MG tablet, Take 300 mg by mouth daily., Disp: , Rfl:  .  amLODipine (NORVASC) 10 MG tablet, 10 mg daily., Disp: , Rfl:  .  aspirin 81 MG EC tablet, Take 81 mg by mouth daily. Swallow whole., Disp: , Rfl:  .  BREO ELLIPTA 100-25 MCG/INH AEPB, INL 1 PUFF PO D, Disp: , Rfl: 5 .  Cholecalciferol (VITAMIN D3) 1000 UNITS CAPS, Take by mouth., Disp: , Rfl:  .  diclofenac sodium (VOLTAREN) 1 % GEL, Apply  topically., Disp: , Rfl:  .  doxycycline (VIBRA-TABS) 100 MG tablet, Take 1 tablet (100 mg total) by mouth 2 (two) times daily., Disp: 20 tablet, Rfl: 0 .  DULoxetine (CYMBALTA) 60 MG capsule, Take one daily, Disp: , Rfl:  .  furosemide (LASIX) 20 MG tablet, Take 1 tablet by mouth 2 (two) times daily., Disp: , Rfl:  .  gabapentin (NEURONTIN) 300 MG capsule, TAKE 1 CAPSULE BY MOUTH THREE TIMES DAILY, Disp: , Rfl:  .  losartan (COZAAR) 50 MG tablet, Take 50 mg by mouth daily. , Disp: , Rfl:  .  metoprolol tartrate (LOPRESSOR) 25 MG tablet, Take 25 mg by mouth daily. , Disp: , Rfl:  .  omeprazole (PRILOSEC) 20 MG capsule, Take 20 mg by mouth daily. , Disp: , Rfl: 0 .  potassium chloride (K-DUR,KLOR-CON) 10 MEQ tablet, Take 10 mEq by mouth daily. , Disp: , Rfl:  .  sertraline (ZOLOFT) 50 MG tablet, 50 mg., Disp: , Rfl:  .  simvastatin (ZOCOR) 20 MG tablet, Take 1 tablet by mouth Daily., Disp: , Rfl:  .  tiotropium (SPIRIVA) 18 MCG inhalation capsule, Place 1 capsule (18 mcg total) into inhaler and inhale daily., Disp: 30 capsule, Rfl: 6 .  tiZANidine (ZANAFLEX) 4 MG tablet, 4 mg 3 (three) times  daily as needed., Disp: , Rfl: 0 .  traMADol (ULTRAM) 50 MG tablet, Take 50 mg by mouth daily as needed for moderate pain. , Disp: , Rfl:  .  zolpidem (AMBIEN) 5 MG tablet, Take 5 mg by mouth at bedtime., Disp: , Rfl:  No current facility-administered medications for this visit.  Facility-Administered Medications Ordered in Other Visits:  .  testosterone cypionate (DEPOTESTOSTERONE CYPIONATE) injection 300 mg, 300 mg, Intramuscular, Q14 Days, Volanda Napoleon, MD  Allergies: No Known Allergies  Past Medical History, Surgical history, Social history, and Family History were reviewed and updated.  Review of Systems: As above  Physical Exam:  height is 5\' 6"  (1.676 m) and weight is 209 lb (94.802 kg). His oral temperature is 98.1 F (36.7 C). His blood pressure is 137/85 and his pulse is 96. His  respiration is 16.   Well-developed and well-nourished African American gentleman. Head and neck exam shows no ocular or oral lesions. He does have a bit of firmness and slight tenderness in about a centimeter area just to the left of the angle of his mouth. This is where he has this abscess. He has no adenopathy. Lungs are clear. Cardiac exam is regular rate and rhythm. There are no murmurs, rubs or bruits. Abdomen is soft. Has good bowel sounds. There is no fluid wave. There is no palpable liver or spleen tip. Back exam shows no tenderness over the spine ribs or hips. Extremities shows no clubbing, cyanosis or edema. He has a knee brace on the left knee. He has some slight swelling in the left leg. Skin exam shows no rashes, ecchymoses or petechia. Neurological exam is nonfocal.  Lab Results  Component Value Date   WBC 7.0 05/03/2015   HGB 16.0 05/03/2015   HCT 48.7 05/03/2015   MCV 93 05/03/2015   PLT 337 05/03/2015     Chemistry      Component Value Date/Time   NA 134 12/21/2014 0857   NA 142 11/09/2014 1343   K 3.3 12/21/2014 0857   K 3.5 11/09/2014 1343   CL 97* 12/21/2014 0857   CL 100 11/09/2014 1343   CO2 30 12/21/2014 0857   CO2 27 11/09/2014 1343   BUN 8 12/21/2014 0857   BUN 7 11/09/2014 1343   CREATININE 1.3* 12/21/2014 0857   CREATININE 0.94 11/09/2014 1343      Component Value Date/Time   CALCIUM 9.2 12/21/2014 0857   CALCIUM 9.3 11/09/2014 1343   ALKPHOS 98* 12/21/2014 0857   ALKPHOS 102 11/09/2014 1343   AST 28 12/21/2014 0857   AST 23 11/09/2014 1343   ALT 32 12/21/2014 0857   ALT 29 11/09/2014 1343   BILITOT 0.80 12/21/2014 0857   BILITOT 0.5 11/09/2014 1343         Impression and Plan: James Zuniga is 59 year old African-American male. He has multifactorial anemia. We have taken care of this nicely. He gets IV iron on occasion although we have not had to give him any for several months.  We will go ahead and give him a testosterone shot today. I think  this is helping him more than anything. I will try to move his testosterone up to every 2 week dosing.  This elderly he probably will need surgery for his knees. His left he has the knee brace on. He has a knee brace for his right knee but this is not functional right now he says.   I noted that his vitamin D was quite low.  I told him to take 2000 units a day of over-the-counter vitamin D  Volanda Napoleon, MD 10/21/20168:32 AM

## 2015-05-03 NOTE — Patient Instructions (Signed)

## 2015-05-03 NOTE — Telephone Encounter (Signed)
Dr Marin Olp aware of critical low K. Attempted to contact pt at both numbers provided. Mailbox full on one and no answer on the other. Will continue to try to reach pt.   Per Dr Marin Olp, pt to take [place] 78meq daily. dph

## 2015-05-04 LAB — RETICULOCYTES (CHCC)
ABS Retic: 76.7 10*3/uL (ref 19.0–186.0)
RBC.: 5.11 MIL/uL (ref 4.22–5.81)
Retic Ct Pct: 1.5 % (ref 0.4–2.3)

## 2015-05-04 LAB — TESTOSTERONE: TESTOSTERONE: 518 ng/dL (ref 300–890)

## 2015-05-17 ENCOUNTER — Ambulatory Visit (HOSPITAL_BASED_OUTPATIENT_CLINIC_OR_DEPARTMENT_OTHER): Payer: PPO

## 2015-05-17 DIAGNOSIS — D509 Iron deficiency anemia, unspecified: Secondary | ICD-10-CM

## 2015-05-17 DIAGNOSIS — E291 Testicular hypofunction: Secondary | ICD-10-CM | POA: Diagnosis not present

## 2015-05-17 MED ORDER — TESTOSTERONE CYPIONATE 200 MG/ML IM SOLN
INTRAMUSCULAR | Status: AC
  Filled 2015-05-17: qty 2

## 2015-05-17 MED ORDER — TESTOSTERONE CYPIONATE 200 MG/ML IM SOLN
300.0000 mg | INTRAMUSCULAR | Status: DC
  Administered 2015-05-17: 300 mg via INTRAMUSCULAR

## 2015-05-17 NOTE — Patient Instructions (Signed)

## 2015-05-31 ENCOUNTER — Ambulatory Visit (HOSPITAL_BASED_OUTPATIENT_CLINIC_OR_DEPARTMENT_OTHER): Payer: PPO

## 2015-05-31 VITALS — BP 137/88 | HR 75 | Temp 98.2°F | Resp 16

## 2015-05-31 DIAGNOSIS — E291 Testicular hypofunction: Secondary | ICD-10-CM

## 2015-05-31 DIAGNOSIS — D509 Iron deficiency anemia, unspecified: Secondary | ICD-10-CM

## 2015-05-31 MED ORDER — TESTOSTERONE CYPIONATE 200 MG/ML IM SOLN
INTRAMUSCULAR | Status: AC
Start: 2015-05-31 — End: 2015-05-31
  Filled 2015-05-31: qty 2

## 2015-05-31 MED ORDER — TESTOSTERONE CYPIONATE 200 MG/ML IM SOLN
300.0000 mg | INTRAMUSCULAR | Status: DC
  Administered 2015-05-31: 300 mg via INTRAMUSCULAR

## 2015-05-31 NOTE — Patient Instructions (Signed)

## 2015-06-13 ENCOUNTER — Ambulatory Visit: Payer: Self-pay

## 2015-06-13 ENCOUNTER — Ambulatory Visit: Payer: Self-pay | Admitting: Hematology & Oncology

## 2015-06-13 ENCOUNTER — Other Ambulatory Visit: Payer: PPO

## 2015-06-14 ENCOUNTER — Telehealth: Payer: Self-pay | Admitting: Hematology & Oncology

## 2015-06-14 NOTE — Telephone Encounter (Signed)
Patient came into office thinking apt was today.  He resch his 06/13/15 missed apt to 06/20/15

## 2015-06-20 ENCOUNTER — Encounter: Payer: Self-pay | Admitting: Hematology & Oncology

## 2015-06-20 ENCOUNTER — Ambulatory Visit (HOSPITAL_BASED_OUTPATIENT_CLINIC_OR_DEPARTMENT_OTHER): Payer: PPO | Admitting: Hematology & Oncology

## 2015-06-20 ENCOUNTER — Ambulatory Visit (HOSPITAL_BASED_OUTPATIENT_CLINIC_OR_DEPARTMENT_OTHER): Payer: PPO

## 2015-06-20 ENCOUNTER — Other Ambulatory Visit (HOSPITAL_BASED_OUTPATIENT_CLINIC_OR_DEPARTMENT_OTHER): Payer: PPO

## 2015-06-20 VITALS — BP 128/91 | HR 96 | Temp 98.1°F | Resp 16 | Ht 66.0 in | Wt 203.0 lb

## 2015-06-20 DIAGNOSIS — T387X5A Adverse effect of androgens and anabolic congeners, initial encounter: Secondary | ICD-10-CM

## 2015-06-20 DIAGNOSIS — D509 Iron deficiency anemia, unspecified: Secondary | ICD-10-CM | POA: Diagnosis not present

## 2015-06-20 DIAGNOSIS — I152 Hypertension secondary to endocrine disorders: Secondary | ICD-10-CM

## 2015-06-20 DIAGNOSIS — E291 Testicular hypofunction: Secondary | ICD-10-CM

## 2015-06-20 DIAGNOSIS — M818 Other osteoporosis without current pathological fracture: Secondary | ICD-10-CM

## 2015-06-20 DIAGNOSIS — E349 Endocrine disorder, unspecified: Secondary | ICD-10-CM

## 2015-06-20 LAB — CBC WITH DIFFERENTIAL (CANCER CENTER ONLY)
BASO#: 0 10*3/uL (ref 0.0–0.2)
BASO%: 0.5 % (ref 0.0–2.0)
EOS ABS: 0.3 10*3/uL (ref 0.0–0.5)
EOS%: 4.6 % (ref 0.0–7.0)
HCT: 48 % (ref 38.7–49.9)
HGB: 15.9 g/dL (ref 13.0–17.1)
LYMPH#: 1.7 10*3/uL (ref 0.9–3.3)
LYMPH%: 29.7 % (ref 14.0–48.0)
MCH: 29.8 pg (ref 28.0–33.4)
MCHC: 33.1 g/dL (ref 32.0–35.9)
MCV: 90 fL (ref 82–98)
MONO#: 0.4 10*3/uL (ref 0.1–0.9)
MONO%: 6.7 % (ref 0.0–13.0)
NEUT#: 3.3 10*3/uL (ref 1.5–6.5)
NEUT%: 58.5 % (ref 40.0–80.0)
PLATELETS: 320 10*3/uL (ref 145–400)
RBC: 5.34 10*6/uL (ref 4.20–5.70)
RDW: 14.8 % (ref 11.1–15.7)
WBC: 5.7 10*3/uL (ref 4.0–10.0)

## 2015-06-20 LAB — IRON AND TIBC
%SAT: 19 % — ABNORMAL LOW (ref 20–55)
Iron: 48 ug/dL (ref 42–163)
TIBC: 256 ug/dL (ref 202–409)
UIBC: 208 ug/dL (ref 117–376)

## 2015-06-20 LAB — COMPREHENSIVE METABOLIC PANEL
ALT: 15 U/L (ref 0–55)
AST: 19 U/L (ref 5–34)
Albumin: 3.5 g/dL (ref 3.5–5.0)
Alkaline Phosphatase: 119 U/L (ref 40–150)
Anion Gap: 12 mEq/L — ABNORMAL HIGH (ref 3–11)
BILIRUBIN TOTAL: 0.47 mg/dL (ref 0.20–1.20)
BUN: 9.9 mg/dL (ref 7.0–26.0)
CO2: 29 meq/L (ref 22–29)
CREATININE: 1.1 mg/dL (ref 0.7–1.3)
Calcium: 9.9 mg/dL (ref 8.4–10.4)
Chloride: 102 mEq/L (ref 98–109)
EGFR: 81 mL/min/{1.73_m2} — ABNORMAL LOW (ref >90)
GLUCOSE: 99 mg/dL (ref 70–140)
Potassium: 3.1 mEq/L — ABNORMAL LOW (ref 3.5–5.1)
SODIUM: 143 meq/L (ref 136–145)
TOTAL PROTEIN: 8 g/dL (ref 6.4–8.3)

## 2015-06-20 LAB — FERRITIN: Ferritin: 333 ng/ml — ABNORMAL HIGH (ref 22–316)

## 2015-06-20 MED ORDER — TESTOSTERONE CYPIONATE 200 MG/ML IM SOLN
300.0000 mg | INTRAMUSCULAR | Status: DC
  Administered 2015-06-20: 300 mg via INTRAMUSCULAR

## 2015-06-20 MED ORDER — TESTOSTERONE CYPIONATE 200 MG/ML IM SOLN
INTRAMUSCULAR | Status: AC
  Filled 2015-06-20: qty 2

## 2015-06-20 NOTE — Patient Instructions (Signed)

## 2015-06-20 NOTE — Progress Notes (Signed)
Hematology and Oncology Follow Up Visit  James Zuniga [id number] 05-Mar-1956 59 y.o. 06/20/2015   Principle Diagnosis:   Iron deficiency anemia  hypotestosteronemia  Chronic liver lesions  Current Therapy:    IV iron as indicated  Depo- testosterone 300mg  IM Q2 weeks     Interim History:  [name] is back for followup. He actually looks fairly good. His knees do bother him. He still not yet seen an orthopedic surgeon.  He is still smoking. He says he smokes 4-5 cigarettes a day. This really is not all that bad. I told him that it will take quite a lot of get off cigarettes. However, as long as he is staying diligent, he will get off.  He's had no problems with bleeding.  There's not been any issues with nausea or vomiting.  He says he is taking medicine. He says he is taking his vitamin D..  His last iron studies done back in October showed a ferritin of 334 with an iron saturation of 39%. His total iron was 104.   His vitamin D level back in April was only 10. I told her to take extra vitamin D. I told her to take 2000 units a day of vitamin D. Hopefully he is doing this.  Medications:  Current outpatient prescriptions:  .  albuterol (PROVENTIL HFA;VENTOLIN HFA) 108 (90 BASE) MCG/ACT inhaler, Inhale 2 puffs into the lungs every 6 (six) hours as needed for wheezing or shortness of breath., Disp: 1 Inhaler, Rfl: 6 .  allopurinol (ZYLOPRIM) 300 MG tablet, Take 300 mg by mouth daily., Disp: , Rfl:  .  amLODipine (NORVASC) 10 MG tablet, 10 mg daily., Disp: , Rfl:  .  aspirin 81 MG EC tablet, Take 81 mg by mouth daily. Swallow whole., Disp: , Rfl:  .  BREO ELLIPTA 100-25 MCG/INH AEPB, INL 1 PUFF PO D, Disp: , Rfl: 5 .  Cholecalciferol (VITAMIN D3) 1000 UNITS CAPS, Take by mouth., Disp: , Rfl:  .  diclofenac sodium (VOLTAREN) 1 % GEL, Apply topically., Disp: , Rfl:  .  doxycycline (VIBRA-TABS) 100 MG tablet, Take 1 tablet (100 mg total) by mouth 2 (two) times daily., Disp: 20  tablet, Rfl: 0 .  DULoxetine (CYMBALTA) 60 MG capsule, Take one daily, Disp: , Rfl:  .  furosemide (LASIX) 20 MG tablet, Take 1 tablet by mouth 2 (two) times daily., Disp: , Rfl:  .  gabapentin (NEURONTIN) 300 MG capsule, TAKE 1 CAPSULE BY MOUTH THREE TIMES DAILY, Disp: , Rfl:  .  losartan (COZAAR) 50 MG tablet, Take 50 mg by mouth daily. , Disp: , Rfl:  .  metoprolol tartrate (LOPRESSOR) 25 MG tablet, Take 25 mg by mouth daily. , Disp: , Rfl:  .  omeprazole (PRILOSEC) 20 MG capsule, Take 20 mg by mouth daily. , Disp: , Rfl: 0 .  potassium chloride SA (K-DUR,KLOR-CON) 20 MEQ tablet, Take 1 tablet (20 mEq total) by mouth 2 (two) times daily., Disp: 30 tablet, Rfl: 3 .  sertraline (ZOLOFT) 50 MG tablet, 50 mg., Disp: , Rfl:  .  simvastatin (ZOCOR) 20 MG tablet, Take 1 tablet by mouth Daily., Disp: , Rfl:  .  tiotropium (SPIRIVA) 18 MCG inhalation capsule, Place 1 capsule (18 mcg total) into inhaler and inhale daily., Disp: 30 capsule, Rfl: 6 .  tiZANidine (ZANAFLEX) 4 MG tablet, 4 mg 3 (three) times daily as needed., Disp: , Rfl: 0 .  traMADol (ULTRAM) 50 MG tablet, Take 50 mg by mouth daily  as needed for moderate pain. , Disp: , Rfl:  .  zolpidem (AMBIEN) 5 MG tablet, Take 5 mg by mouth at bedtime., Disp: , Rfl:  No current facility-administered medications for this visit.  Facility-Administered Medications Ordered in Other Visits:  .  testosterone cypionate (DEPOTESTOSTERONE CYPIONATE) injection 300 mg, 300 mg, Intramuscular, Q14 Days, Volanda Napoleon, MD, 300 mg at 06/20/15 1011  Allergies: No Known Allergies  Past Medical History, Surgical history, Social history, and Family History were reviewed and updated.  Review of Systems: As above  Physical Exam:  height is 5\' 6"  (1.676 m) and weight is 203 lb (92.08 kg). His oral temperature is 98.1 F (36.7 C). His blood pressure is 128/91 and his pulse is 96. His respiration is 16.   Well-developed and well-nourished African American  gentleman. Head and neck exam shows no ocular or oral lesions. He does have a bit of firmness and slight tenderness in about a centimeter area just to the left of the angle of his mouth. This is where he has this abscess. He has no adenopathy. Lungs are clear. Cardiac exam is regular rate and rhythm. There are no murmurs, rubs or bruits. Abdomen is soft. Has good bowel sounds. There is no fluid wave. There is no palpable liver or spleen tip. Back exam shows no tenderness over the spine ribs or hips. Extremities shows no clubbing, cyanosis or edema. He has a knee brace on the left knee. He has some slight swelling in the left leg. Skin exam shows no rashes, ecchymoses or petechia. Neurological exam is nonfocal.  Lab Results  Component Value Date   WBC 5.7 06/20/2015   HGB 15.9 06/20/2015   HCT 48.0 06/20/2015   MCV 90 06/20/2015   PLT 320 06/20/2015     Chemistry      Component Value Date/Time   NA 142 05/03/2015 0803   NA 134 12/21/2014 0857   NA 142 11/09/2014 1343   K 2.9* 05/03/2015 0803   K 3.3 12/21/2014 0857   K 3.5 11/09/2014 1343   CL 97* 12/21/2014 0857   CL 100 11/09/2014 1343   CO2 30* 05/03/2015 0803   CO2 30 12/21/2014 0857   CO2 27 11/09/2014 1343   BUN 7.6 05/03/2015 0803   BUN 8 12/21/2014 0857   BUN 7 11/09/2014 1343   CREATININE 1.2 05/03/2015 0803   CREATININE 1.3* 12/21/2014 0857   CREATININE 0.94 11/09/2014 1343      Component Value Date/Time   CALCIUM 10.0 05/03/2015 0803   CALCIUM 9.2 12/21/2014 0857   CALCIUM 9.3 11/09/2014 1343   ALKPHOS 104 05/03/2015 0803   ALKPHOS 98* 12/21/2014 0857   ALKPHOS 102 11/09/2014 1343   AST 20 05/03/2015 0803   AST 28 12/21/2014 0857   AST 23 11/09/2014 1343   ALT 26 05/03/2015 0803   ALT 32 12/21/2014 0857   ALT 29 11/09/2014 1343   BILITOT 0.45 05/03/2015 0803   BILITOT 0.80 12/21/2014 0857   BILITOT 0.5 11/09/2014 1343         Impression and Plan: James Zuniga is 59 year old African-American male. He has  multifactorial anemia. We have taken care of this nicely. He gets IV iron on occasion although we have not had to give him any for several months.  We will go ahead and give him a testosterone shot today. I think this is helping him more than anything. The every 2 week dosing of testosterone I think is working very nicely.   Unfortunately  he probably will need surgery for his knees. His left knee has the knee brace on. He has a knee brace for his right knee but this is not functional right now he says. I'm not sure when he would have knee surgery. I think he really wants to avoid have anything else done.   Nile Riggs, MD 12/8/201610:49 AM

## 2015-06-21 LAB — VITAMIN D 25 HYDROXY (VIT D DEFICIENCY, FRACTURES): Vit D, 25-Hydroxy: 18 ng/mL — ABNORMAL LOW (ref 30–100)

## 2015-06-21 LAB — RETICULOCYTES
ABS RETIC: 63 10*3/uL (ref 19.0–186.0)
RBC.: 5.25 MIL/uL (ref 4.22–5.81)
Retic Ct Pct: 1.2 % (ref 0.4–2.3)

## 2015-06-21 LAB — TESTOSTERONE: Testosterone: 438 ng/dL (ref 300–890)

## 2015-06-24 ENCOUNTER — Telehealth: Payer: Self-pay | Admitting: *Deleted

## 2015-06-24 NOTE — Telephone Encounter (Addendum)
Attempted to call both numbers. Cannot connect nor leave messages. Will try again tomorrow.   ----- Message from Volanda Napoleon, MD sent at 06/20/2015  6:09 PM EST ----- Call - the iron level is low!!  Please set hiim up with 1 dose of Feraheme!!  pete

## 2015-06-25 ENCOUNTER — Other Ambulatory Visit: Payer: Self-pay | Admitting: Nurse Practitioner

## 2015-06-25 DIAGNOSIS — D509 Iron deficiency anemia, unspecified: Secondary | ICD-10-CM

## 2015-07-05 ENCOUNTER — Ambulatory Visit (HOSPITAL_BASED_OUTPATIENT_CLINIC_OR_DEPARTMENT_OTHER): Payer: PPO

## 2015-07-05 VITALS — BP 131/84 | HR 96 | Temp 98.6°F | Resp 16

## 2015-07-05 DIAGNOSIS — D509 Iron deficiency anemia, unspecified: Secondary | ICD-10-CM | POA: Diagnosis not present

## 2015-07-05 DIAGNOSIS — E291 Testicular hypofunction: Secondary | ICD-10-CM | POA: Diagnosis not present

## 2015-07-05 MED ORDER — SODIUM CHLORIDE 0.9 % IV SOLN
510.0000 mg | Freq: Once | INTRAVENOUS | Status: AC
  Administered 2015-07-05: 510 mg via INTRAVENOUS
  Filled 2015-07-05: qty 17

## 2015-07-05 MED ORDER — TESTOSTERONE CYPIONATE 200 MG/ML IM SOLN
300.0000 mg | INTRAMUSCULAR | Status: DC
  Administered 2015-07-05: 300 mg via INTRAMUSCULAR

## 2015-07-05 MED ORDER — TESTOSTERONE CYPIONATE 200 MG/ML IM SOLN
INTRAMUSCULAR | Status: AC
  Filled 2015-07-05: qty 2

## 2015-07-05 MED ORDER — SODIUM CHLORIDE 0.9 % IV SOLN
INTRAVENOUS | Status: DC
  Administered 2015-07-05: 08:00:00 via INTRAVENOUS

## 2015-07-05 NOTE — Patient Instructions (Signed)
Testosterone injection What is this medicine? TESTOSTERONE (tes TOS ter one) is the main male hormone. It supports normal male development such as muscle growth, facial hair, and deep voice. It is used in males to treat low testosterone levels. This medicine may be used for other purposes; ask your health care provider or pharmacist if you have questions. What should I tell my health care provider before I take this medicine? They need to know if you have any of these conditions: -breast cancer -diabetes -heart disease -kidney disease -liver disease -lung disease -prostate cancer, enlargement -an unusual or allergic reaction to testosterone, other medicines, foods, dyes, or preservatives -pregnant or trying to get pregnant -breast-feeding How should I use this medicine? This medicine is for injection into a muscle. It is usually given by a health care professional in a hospital or clinic setting. Contact your pediatrician regarding the use of this medicine in children. While this medicine may be prescribed for children as young as 12 years of age for selected conditions, precautions do apply. Overdosage: If you think you have taken too much of this medicine contact a poison control center or emergency room at once. NOTE: This medicine is only for you. Do not share this medicine with others. What if I miss a dose? Try not to miss a dose. Your doctor or health care professional will tell you when your next injection is due. Notify the office if you are unable to keep an appointment. What may interact with this medicine? -medicines for diabetes -medicines that treat or prevent blood clots like warfarin -oxyphenbutazone -propranolol -steroid medicines like prednisone or cortisone This list may not describe all possible interactions. Give your health care provider a list of all the medicines, herbs, non-prescription drugs, or dietary supplements you use. Also tell them if you smoke, drink  alcohol, or use illegal drugs. Some items may interact with your medicine. What should I watch for while using this medicine? Visit your doctor or health care professional for regular checks on your progress. They will need to check the level of testosterone in your blood. This medicine is only approved for use in men who have low levels of testosterone related to certain medical conditions. Heart attacks and strokes have been reported with the use of this medicine. Notify your doctor or health care professional and seek emergency treatment if you develop breathing problems; changes in vision; confusion; chest pain or chest tightness; sudden arm pain; severe, sudden headache; trouble speaking or understanding; sudden numbness or weakness of the face, arm or leg; loss of balance or coordination. Talk to your doctor about the risks and benefits of this medicine. This medicine may affect blood sugar levels. If you have diabetes, check with your doctor or health care professional before you change your diet or the dose of your diabetic medicine. This drug is banned from use in athletes by most athletic organizations. What side effects may I notice from receiving this medicine? Side effects that you should report to your doctor or health care professional as soon as possible: -allergic reactions like skin rash, itching or hives, swelling of the face, lips, or tongue -breast enlargement -breathing problems -changes in mood, especially anger, depression, or rage -dark urine -general ill feeling or flu-like symptoms -light-colored stools -loss of appetite, nausea -nausea, vomiting -right upper belly pain -stomach pain -swelling of ankles -too frequent or persistent erections -trouble passing urine or change in the amount of urine -unusually weak or tired -yellowing of the eyes or   skin Additional side effects that can occur in women include: -deep or hoarse voice -facial hair growth -irregular  menstrual periods Side effects that usually do not require medical attention (report to your doctor or health care professional if they continue or are bothersome): -acne -change in sex drive or performance -hair loss -headache This list may not describe all possible side effects. Call your doctor for medical advice about side effects. You may report side effects to FDA at 1-800-FDA-1088. Where should I keep my medicine? Keep out of the reach of children. This medicine can be abused. Keep your medicine in a safe place to protect it from theft. Do not share this medicine with anyone. Selling or giving away this medicine is dangerous and against the law. Store at room temperature between 20 and 25 degrees C (68 and 77 degrees F). Do not freeze. Protect from light. Follow the directions for the product you are prescribed. Throw away any unused medicine after the expiration date. NOTE: This sheet is a summary. It may not cover all possible information. If you have questions about this medicine, talk to your doctor, pharmacist, or health care provider.    2016, Elsevier/Gold Standard. (2013-09-14 [id number]) Ferumoxytol injection What is this medicine? FERUMOXYTOL is an iron complex. Iron is used to make healthy red blood cells, which carry oxygen and nutrients throughout the body. This medicine is used to treat iron deficiency anemia in people with chronic kidney disease. This medicine may be used for other purposes; ask your health care provider or pharmacist if you have questions. What should I tell my health care provider before I take this medicine? They need to know if you have any of these conditions: -anemia not caused by low iron levels -high levels of iron in the blood -magnetic resonance imaging (MRI) test scheduled -an unusual or allergic reaction to iron, other medicines, foods, dyes, or preservatives -pregnant or trying to get pregnant -breast-feeding How should I use this  medicine? This medicine is for injection into a vein. It is given by a health care professional in a hospital or clinic setting. Talk to your pediatrician regarding the use of this medicine in children. Special care may be needed. Overdosage: If you think you have taken too much of this medicine contact a poison control center or emergency room at once. NOTE: This medicine is only for you. Do not share this medicine with others. What if I miss a dose? It is important not to miss your dose. Call your doctor or health care professional if you are unable to keep an appointment. What may interact with this medicine? This medicine may interact with the following medications: -other iron products This list may not describe all possible interactions. Give your health care provider a list of all the medicines, herbs, non-prescription drugs, or dietary supplements you use. Also tell them if you smoke, drink alcohol, or use illegal drugs. Some items may interact with your medicine. What should I watch for while using this medicine? Visit your doctor or healthcare professional regularly. Tell your doctor or healthcare professional if your symptoms do not start to get better or if they get worse. You may need blood work done while you are taking this medicine. You may need to follow a special diet. Talk to your doctor. Foods that contain iron include: whole grains/cereals, dried fruits, beans, or peas, leafy green vegetables, and organ meats (liver, kidney). What side effects may I notice from receiving this medicine? Side effects that you  should report to your doctor or health care professional as soon as possible: -allergic reactions like skin rash, itching or hives, swelling of the face, lips, or tongue -breathing problems -changes in blood pressure -feeling faint or lightheaded, falls -fever or chills -flushing, sweating, or hot feelings -swelling of the ankles or feet Side effects that usually do not  require medical attention (Report these to your doctor or health care professional if they continue or are bothersome.): -diarrhea -headache -nausea, vomiting -stomach pain This list may not describe all possible side effects. Call your doctor for medical advice about side effects. You may report side effects to FDA at 1-800-FDA-1088. Where should I keep my medicine? This drug is given in a hospital or clinic and will not be stored at home. NOTE: This sheet is a summary. It may not cover all possible information. If you have questions about this medicine, talk to your doctor, pharmacist, or health care provider.    2016, Elsevier/Gold Standard. (2012-02-12 15:23:36)

## 2015-07-19 ENCOUNTER — Ambulatory Visit (HOSPITAL_BASED_OUTPATIENT_CLINIC_OR_DEPARTMENT_OTHER): Payer: PPO

## 2015-07-19 VITALS — BP 128/93 | HR 93 | Temp 98.4°F | Resp 16

## 2015-07-19 DIAGNOSIS — D509 Iron deficiency anemia, unspecified: Secondary | ICD-10-CM

## 2015-07-19 DIAGNOSIS — E291 Testicular hypofunction: Secondary | ICD-10-CM

## 2015-07-19 MED ORDER — TESTOSTERONE CYPIONATE 200 MG/ML IM SOLN
INTRAMUSCULAR | Status: AC
  Filled 2015-07-19: qty 2

## 2015-07-19 MED ORDER — TESTOSTERONE CYPIONATE 200 MG/ML IM SOLN
300.0000 mg | INTRAMUSCULAR | Status: DC
  Administered 2015-07-19: 300 mg via INTRAMUSCULAR

## 2015-07-19 NOTE — Patient Instructions (Signed)

## 2015-07-22 ENCOUNTER — Ambulatory Visit (INDEPENDENT_AMBULATORY_CARE_PROVIDER_SITE_OTHER): Payer: PPO | Admitting: Adult Health

## 2015-07-22 ENCOUNTER — Encounter: Payer: Self-pay | Admitting: Adult Health

## 2015-07-22 ENCOUNTER — Encounter (INDEPENDENT_AMBULATORY_CARE_PROVIDER_SITE_OTHER): Payer: Self-pay

## 2015-07-22 VITALS — BP 130/86 | HR 92 | Temp 98.6°F | Ht 66.0 in | Wt 209.0 lb

## 2015-07-22 DIAGNOSIS — F17200 Nicotine dependence, unspecified, uncomplicated: Secondary | ICD-10-CM

## 2015-07-22 DIAGNOSIS — J9611 Chronic respiratory failure with hypoxia: Secondary | ICD-10-CM

## 2015-07-22 DIAGNOSIS — J449 Chronic obstructive pulmonary disease, unspecified: Secondary | ICD-10-CM

## 2015-07-22 DIAGNOSIS — Z720 Tobacco use: Secondary | ICD-10-CM

## 2015-07-22 DIAGNOSIS — R0602 Shortness of breath: Secondary | ICD-10-CM | POA: Diagnosis not present

## 2015-07-22 DIAGNOSIS — R911 Solitary pulmonary nodule: Secondary | ICD-10-CM

## 2015-07-22 DIAGNOSIS — J9610 Chronic respiratory failure, unspecified whether with hypoxia or hypercapnia: Secondary | ICD-10-CM | POA: Insufficient documentation

## 2015-07-22 DIAGNOSIS — D509 Iron deficiency anemia, unspecified: Secondary | ICD-10-CM

## 2015-07-22 DIAGNOSIS — E349 Endocrine disorder, unspecified: Secondary | ICD-10-CM

## 2015-07-22 DIAGNOSIS — E291 Testicular hypofunction: Secondary | ICD-10-CM

## 2015-07-22 MED ORDER — BREO ELLIPTA 100-25 MCG/INH IN AEPB: INHALATION_SPRAY | RESPIRATORY_TRACT | Status: DC

## 2015-07-22 MED ORDER — TIOTROPIUM BROMIDE MONOHYDRATE 18 MCG IN CAPS: 18.0000 ug | ORAL_CAPSULE | Freq: Every day | RESPIRATORY_TRACT | Status: DC

## 2015-07-22 NOTE — Addendum Note (Signed)
Addended by: Osa Craver on: 07/22/2015 03:25 PM   Modules accepted: Orders

## 2015-07-22 NOTE — Assessment & Plan Note (Signed)
We are setting you up for a CT chest to follow up lung nodule.   Follow up Dr. Chase Caller in 3 months and As needed

## 2015-07-22 NOTE — Assessment & Plan Note (Signed)
Wear oxygen At bedtime  .  Follow up Dr. Chase Caller in 3 months and As needed

## 2015-07-22 NOTE — Assessment & Plan Note (Addendum)
Spirometry shows no sign airflow obstruction , sx seem out of proportion to lung fxn .  Smoking cessation is key.   Plan  Take BREO and Spiriva daily -take everyday-rinse after use.  Call back if inahlers are not affordable.  Must stop smoking .  If hoarseness is not improving will need to referral to ENT to evaluate, call back to our office if not resolving .  We are setting you up for a CT chest to follow up lung nodule.  Wear oxygen At bedtime  .  Follow up Dr. Chase Caller in 3 months and As needed

## 2015-07-22 NOTE — Assessment & Plan Note (Signed)
Smoking cessation  

## 2015-07-22 NOTE — Patient Instructions (Addendum)
Take BREO and Spiriva daily -take everyday-rinse after use.  Call back if inahlers are not affordable.  Must stop smoking .  If hoarseness is not improving will need to referral to ENT to evaluate, call back to our office if not resolving .  We are setting you up for a CT chest to follow up lung nodule.  Wear oxygen At bedtime  .  Follow up Dr. Chase Caller in 3 months and As needed

## 2015-07-22 NOTE — Progress Notes (Signed)
Subjective:    Patient ID: James Zuniga, male    DOB: 14-Nov-1955, 60 y.o.   MRN: [id number]  HPI  60 yo male smoker with COPD and chronic resp failure on O2 At bedtime    Lung nodule 36mm - stable aug 2014 and 2015. Next is September 2016   07/22/2015 Follow up : COPD  Pt returns for 6 month follow up for COPD  Pt c/o chest tightness, hoarness and sore throat at times, SOB and wheezing with exertion x 3 months. Feels like his breathing is getting worse.   Denies any chest congestion, sinus drainage/presure, fever, nausea or vomiting. Remains on BREO and Spiriva. Admits to not taking on regular basis. On avg 2-3 days .  On O2 3l/m At bedtime  -does not use everynihgt  Discussed compliance of Oxygen and meds.  Disabled from -back issues.  Over due for CT chest to follow up lung nodule 78mm RUL  Has restarted smoking, discussed smoking cessation.  Denies chest pain, orthopnea, edema or fever.  Spirometry today shows FEV1 79, ratio 90, FVC 70% mid flows 127%.   Past Medical History  Diagnosis Date  . CHF (congestive heart failure) ([place])   . Coronary artery disease   . Diabetes mellitus   . Hypertension   . Renal disorder   . Hyperlipidemia   . Iron deficiency anemia, unspecified 07/01/2013  . Hypotestosteronism 07/01/2013  . Chronic pain   . Vitamin D deficiency   . Gout   . Esophageal reflux   . Chronic airway obstruction ([place])   . Insomnia   . Memory loss   . Disorder of liver   . PVD (peripheral vascular disease) ([place])   . Hypokalemia    Current Outpatient Prescriptions on File Prior to Visit  Medication Sig Dispense Refill  . albuterol (PROVENTIL HFA;VENTOLIN HFA) 108 (90 BASE) MCG/ACT inhaler Inhale 2 puffs into the lungs every 6 (six) hours as needed for wheezing or shortness of breath. 1 Inhaler 6  . allopurinol (ZYLOPRIM) 300 MG tablet Take 300 mg by mouth daily.    Marland Kitchen amLODipine (NORVASC) 10 MG tablet 10 mg daily.    Marland Kitchen aspirin 81 MG EC tablet Take 81 mg by mouth  daily. Swallow whole.    Marland Kitchen atorvastatin (LIPITOR) 10 MG tablet TAKE 1 TABLET BY MOUTH DAILY    . BREO ELLIPTA 100-25 MCG/INH AEPB INL 1 PUFF PO D  5  . Cholecalciferol (VITAMIN D3) 1000 UNITS CAPS Take by mouth.    . diclofenac sodium (VOLTAREN) 1 % GEL Apply topically.    . DULoxetine (CYMBALTA) 60 MG capsule Take one daily    . furosemide (LASIX) 20 MG tablet Take 1 tablet by mouth 2 (two) times daily.    Marland Kitchen gabapentin (NEURONTIN) 300 MG capsule TAKE 1 CAPSULE BY MOUTH THREE TIMES DAILY    . losartan (COZAAR) 50 MG tablet Take 50 mg by mouth daily.     . metoprolol tartrate (LOPRESSOR) 25 MG tablet Take 25 mg by mouth daily.     Marland Kitchen omeprazole (PRILOSEC) 20 MG capsule Take 20 mg by mouth daily.   0  . potassium chloride SA (K-DUR,KLOR-CON) 20 MEQ tablet Take 1 tablet (20 mEq total) by mouth 2 (two) times daily. 30 tablet 3  . tiotropium (SPIRIVA) 18 MCG inhalation capsule Place 1 capsule (18 mcg total) into inhaler and inhale daily. 30 capsule 6  . tiZANidine (ZANAFLEX) 4 MG tablet 4 mg 3 (three) times daily as needed.  0  . traMADol (ULTRAM) 50 MG tablet Take 50 mg by mouth daily as needed for moderate pain.     Marland Kitchen zolpidem (AMBIEN) 5 MG tablet Take 5 mg by mouth at bedtime.    . sertraline (ZOLOFT) 50 MG tablet 50 mg. Reported on 07/22/2015     No current facility-administered medications on file prior to visit.      Review of Systems Constitutional:   No  weight loss, night sweats,  Fevers, chills,  +fatigue, or  lassitude.  HEENT:   No headaches,  Difficulty swallowing,  Tooth/dental problems, or  Sore throat,                No sneezing, itching, ear ache, nasal congestion, post nasal drip,   CV:  No chest pain,  Orthopnea, PND, swelling in lower extremities, anasarca, dizziness, palpitations, syncope.   GI  No heartburn, indigestion, abdominal pain, nausea, vomiting, diarrhea, change in bowel habits, loss of appetite, bloody stools.   Resp:   No chest wall deformity  Skin: no  rash or lesions.  GU: no dysuria, change in color of urine, no urgency or frequency.  No flank pain, no hematuria   MS:  No joint pain or swelling.  No decreased range of motion.  No back pain.  Psych:  No change in mood or affect. No depression or anxiety.  No memory loss.         Objective:   Physical Exam Filed Vitals:   07/22/15 1415  BP: 130/86  Pulse: 92  Temp: 98.6 F (37 C)  TempSrc: Oral  Height: 5\' 6"  (1.676 m)  Weight: 209 lb (94.802 kg)  SpO2: 97%   GEN: A/Ox3; pleasant , NAD overweight  HEENT:  [place]/AT,  EACs-clear, TMs-wnl, NOSE-clear, THROAT-clear, no lesions, no postnasal drip or exudate noted. Poor dentiton   NECK:  Supple w/ fair ROM; no JVD; normal carotid impulses w/o bruits; no thyromegaly or nodules palpated; no lymphadenopathy.  RESP  Decreased BS in bases  w/o, wheezes/ rales/ or rhonchi.no accessory muscle use, no dullness to percussion  CARD:  RRR, no m/r/g  , no peripheral edema, pulses intact, no cyanosis or clubbing.  GI:   Soft & nt; nml bowel sounds; no organomegaly or masses detected.  Musco: Warm bil, no deformities or joint swelling noted.   Neuro: alert, no focal deficits noted.    Skin: Warm, no lesions or rashes         Assessment & Plan:

## 2015-07-22 NOTE — Addendum Note (Signed)
Addended by: Osa Craver on: 07/22/2015 03:08 PM   Modules accepted: Orders

## 2015-07-26 ENCOUNTER — Ambulatory Visit (INDEPENDENT_AMBULATORY_CARE_PROVIDER_SITE_OTHER)
Admission: RE | Admit: 2015-07-26 | Discharge: 2015-07-26 | Disposition: A | Discharge status: Home / Self Care | Payer: PPO | Source: Ambulatory Visit | Attending: Adult Health | Admitting: Adult Health

## 2015-07-26 DIAGNOSIS — R911 Solitary pulmonary nodule: Secondary | ICD-10-CM | POA: Diagnosis not present

## 2015-07-26 NOTE — Progress Notes (Signed)
Quick Note:  Called and spoke with pt. Reviewed recs. Pt voiced understanding and had no further questions. ______

## 2015-08-02 ENCOUNTER — Ambulatory Visit (HOSPITAL_BASED_OUTPATIENT_CLINIC_OR_DEPARTMENT_OTHER): Payer: PPO

## 2015-08-02 VITALS — BP 129/83 | HR 59 | Temp 98.3°F | Resp 16

## 2015-08-02 DIAGNOSIS — E291 Testicular hypofunction: Secondary | ICD-10-CM

## 2015-08-02 DIAGNOSIS — D509 Iron deficiency anemia, unspecified: Secondary | ICD-10-CM

## 2015-08-02 MED ORDER — TESTOSTERONE CYPIONATE 200 MG/ML IM SOLN
INTRAMUSCULAR | Status: AC
  Filled 2015-08-02: qty 2

## 2015-08-02 MED ORDER — TESTOSTERONE CYPIONATE 200 MG/ML IM SOLN
300.0000 mg | INTRAMUSCULAR | Status: DC
  Administered 2015-08-02: 300 mg via INTRAMUSCULAR

## 2015-08-02 NOTE — Patient Instructions (Signed)

## 2015-08-08 DIAGNOSIS — J441 Chronic obstructive pulmonary disease with (acute) exacerbation: Secondary | ICD-10-CM | POA: Diagnosis not present

## 2015-08-16 ENCOUNTER — Ambulatory Visit (HOSPITAL_BASED_OUTPATIENT_CLINIC_OR_DEPARTMENT_OTHER): Payer: PPO | Admitting: Family

## 2015-08-16 ENCOUNTER — Other Ambulatory Visit (HOSPITAL_BASED_OUTPATIENT_CLINIC_OR_DEPARTMENT_OTHER): Payer: PPO

## 2015-08-16 ENCOUNTER — Ambulatory Visit (HOSPITAL_BASED_OUTPATIENT_CLINIC_OR_DEPARTMENT_OTHER): Payer: PPO

## 2015-08-16 ENCOUNTER — Encounter: Payer: Self-pay | Admitting: Family

## 2015-08-16 VITALS — Ht 66.0 in | Wt 205.0 lb

## 2015-08-16 DIAGNOSIS — D509 Iron deficiency anemia, unspecified: Secondary | ICD-10-CM

## 2015-08-16 DIAGNOSIS — I152 Hypertension secondary to endocrine disorders: Secondary | ICD-10-CM

## 2015-08-16 DIAGNOSIS — E291 Testicular hypofunction: Secondary | ICD-10-CM | POA: Diagnosis not present

## 2015-08-16 DIAGNOSIS — E349 Endocrine disorder, unspecified: Secondary | ICD-10-CM

## 2015-08-16 DIAGNOSIS — K769 Liver disease, unspecified: Secondary | ICD-10-CM | POA: Diagnosis not present

## 2015-08-16 LAB — CBC WITH DIFFERENTIAL (CANCER CENTER ONLY)
BASO#: 0 10*3/uL (ref 0.0–0.2)
BASO%: 0.5 % (ref 0.0–2.0)
EOS%: 3.7 % (ref 0.0–7.0)
Eosinophils Absolute: 0.2 10*3/uL (ref 0.0–0.5)
HCT: 49.4 % (ref 38.7–49.9)
HEMOGLOBIN: 16.2 g/dL (ref 13.0–17.1)
LYMPH#: 1.8 10*3/uL (ref 0.9–3.3)
LYMPH%: 27.4 % (ref 14.0–48.0)
MCH: 29.7 pg (ref 28.0–33.4)
MCHC: 32.8 g/dL (ref 32.0–35.9)
MCV: 91 fL (ref 82–98)
MONO#: 0.4 10*3/uL (ref 0.1–0.9)
MONO%: 5.7 % (ref 0.0–13.0)
NEUT%: 62.7 % (ref 40.0–80.0)
NEUTROS ABS: 4.1 10*3/uL (ref 1.5–6.5)
PLATELETS: 306 10*3/uL (ref 145–400)
RBC: 5.46 10*6/uL (ref 4.20–5.70)
RDW: 15.7 % (ref 11.1–15.7)
WBC: 6.5 10*3/uL (ref 4.0–10.0)

## 2015-08-16 LAB — COMPREHENSIVE METABOLIC PANEL
ALT: 22 U/L (ref 0–55)
ANION GAP: 14 meq/L — AB (ref 3–11)
AST: 19 U/L (ref 5–34)
Albumin: 3.8 g/dL (ref 3.5–5.0)
Alkaline Phosphatase: 116 U/L (ref 40–150)
BUN: 10 mg/dL (ref 7.0–26.0)
CALCIUM: 9.4 mg/dL (ref 8.4–10.4)
CO2: 26 meq/L (ref 22–29)
Chloride: 102 mEq/L (ref 98–109)
Creatinine: 1.1 mg/dL (ref 0.7–1.3)
EGFR: 81 mL/min/{1.73_m2} — ABNORMAL LOW (ref >90)
Glucose: 83 mg/dl (ref 70–140)
POTASSIUM: 3.2 meq/L — AB (ref 3.5–5.1)
Sodium: 143 mEq/L (ref 136–145)
Total Bilirubin: 0.62 mg/dL (ref 0.20–1.20)
Total Protein: 7.9 g/dL (ref 6.4–8.3)

## 2015-08-16 LAB — IRON AND TIBC
%SAT: 36 % (ref 20–55)
Iron: 92 ug/dL (ref 42–163)
TIBC: 254 ug/dL (ref 202–409)
UIBC: 162 ug/dL (ref 117–376)

## 2015-08-16 LAB — FERRITIN: FERRITIN: 377 ng/mL — AB (ref 22–316)

## 2015-08-16 MED ORDER — TESTOSTERONE CYPIONATE 200 MG/ML IM SOLN
INTRAMUSCULAR | Status: AC
  Filled 2015-08-16: qty 2

## 2015-08-16 MED ORDER — TESTOSTERONE CYPIONATE 200 MG/ML IM SOLN
300.0000 mg | INTRAMUSCULAR | Status: DC
Start: 2015-08-16 — End: 2015-08-16
  Administered 2015-08-16: 300 mg via INTRAMUSCULAR

## 2015-08-16 NOTE — Progress Notes (Signed)
Hematology and Oncology Follow Up Visit  James Zuniga [id number] 1955/10/28 60 y.o. 08/16/2015   Principle Diagnosis:  Iron deficiency anemia Hypotestosteronemia Chronic liver lesions  Current Therapy:   IV iron as indicated - last received in December 2016 Depotestosterone 400 mg IM Q2 weeks    Interim History: James Zuniga is here today for a follow-up. He is having some continued fatigue. He states that he can tell that it is no time for another testosterone injection. No fever, chills, n/v, cough, rash, headache, dizziness, blurred vision, chest pain, palpitations, abdominal pain, changes in bowel or bladder habits.  He recently saw his pulmonologist and had pulmonary function test done. He is now on supplemental oxygen at night at 3 L nasal cannula. He has some mild puffiness in his ankles. He states that he is on Lasix daily and that the swelling in his ankles is much improved. No numbness or tingling in his extremities.  His blood sugars have been well controlled. He is no longer on metformin and is controlling his diabetes with diet and exercise.  His appetite comes and goes but his weight is stable. He will try eating several small meals a day instead of 3 large meals. He is staying well-hydrated but admits that he should be drinking more water instead of soda.   Medications:    Medication List       This list is accurate as of: 08/16/15  8:53 AM.  Always use your most recent med list.               albuterol 108 (90 Base) MCG/ACT inhaler  Commonly known as:  PROVENTIL HFA;VENTOLIN HFA  Inhale 2 puffs into the lungs every 6 (six) hours as needed for wheezing or shortness of breath.     allopurinol 300 MG tablet  Commonly known as:  ZYLOPRIM  Take 300 mg by mouth daily.     amLODipine 10 MG tablet  Commonly known as:  NORVASC  10 mg daily.     aspirin 81 MG EC tablet  Take 81 mg by mouth daily. Swallow whole.     atorvastatin 10 MG tablet  Commonly known as:   LIPITOR  TAKE 1 TABLET BY MOUTH DAILY     BREO ELLIPTA 100-25 MCG/INH Aepb  Generic drug:  Fluticasone Furoate-Vilanterol  INL 1 PUFF PO D     CYMBALTA 60 MG capsule  Generic drug:  DULoxetine  Take one daily     diclofenac sodium 1 % Gel  Commonly known as:  VOLTAREN  Apply topically.     FIFTY50 GLUCOSE METER 2.0 w/Device Kit  Use as instructed     furosemide 20 MG tablet  Commonly known as:  LASIX  Take 1 tablet by mouth 2 (two) times daily.     gabapentin 300 MG capsule  Commonly known as:  NEURONTIN  TAKE 1 CAPSULE BY MOUTH THREE TIMES DAILY     losartan 50 MG tablet  Commonly known as:  COZAAR  Take 50 mg by mouth daily.     metoprolol tartrate 25 MG tablet  Commonly known as:  LOPRESSOR  Take 25 mg by mouth daily.     omeprazole 20 MG capsule  Commonly known as:  PRILOSEC  Take 20 mg by mouth daily.     potassium chloride SA 20 MEQ tablet  Commonly known as:  K-DUR,KLOR-CON  Take 1 tablet (20 mEq total) by mouth 2 (two) times daily.     tiotropium 18 MCG  inhalation capsule  Commonly known as:  SPIRIVA  Place 1 capsule (18 mcg total) into inhaler and inhale daily.     tiZANidine 4 MG tablet  Commonly known as:  ZANAFLEX  4 mg 3 (three) times daily as needed.     traMADol 50 MG tablet  Commonly known as:  ULTRAM  Take 50 mg by mouth daily as needed for moderate pain.     Vitamin D3 1000 units Caps  Take by mouth.     ZOLOFT 50 MG tablet  Generic drug:  sertraline  50 mg. Reported on 07/22/2015     zolpidem 5 MG tablet  Commonly known as:  AMBIEN  Take 5 mg by mouth at bedtime.        Allergies: No Known Allergies  Past Medical History, Surgical history, Social history, and Family History were reviewed and updated.  Review of Systems: All other 10 point review of systems is negative.   Physical Exam:  vitals were not taken for this visit.  Wt Readings from Last 3 Encounters:  07/22/15 209 lb (94.802 kg)  06/20/15 203 lb (92.08 kg)    05/03/15 209 lb (94.802 kg)    Ocular: Sclerae unicteric, pupils equal, round and reactive to light Ear-nose-throat: Oropharynx clear, dentition fair Lymphatic: No cervical supraclavicular or axillary adenopathy Lungs no rales or rhonchi, good excursion bilaterally Heart regular rate and rhythm, no murmur appreciated Abd soft, nontender, positive bowel sounds no liver or spleen tip palpated on exam,  MSK no focal spinal tenderness, no joint edema Neuro: non-focal, well-oriented, appropriate affect Breast: Deferred   Lab Results  Component Value Date   WBC 6.5 08/16/2015   HGB 16.2 08/16/2015   HCT 49.4 08/16/2015   MCV 91 08/16/2015   PLT 306 08/16/2015   Lab Results  Component Value Date   FERRITIN 333* 06/20/2015   IRON 48 06/20/2015   TIBC 256 06/20/2015   UIBC 208 06/20/2015   IRONPCTSAT 19* 06/20/2015   Lab Results  Component Value Date   RETICCTPCT 1.2 06/20/2015   RBC 5.46 08/16/2015   RETICCTABS 63.0 06/20/2015   No results found for: KPAFRELGTCHN, LAMBDASER, KAPLAMBRATIO No results found for: IGGSERUM, IGA, IGMSERUM No results found for: Odetta Pink, SPEI   Chemistry      Component Value Date/Time   NA 143 06/20/2015 0941   NA 134 12/21/2014 0857   NA 142 11/09/2014 1343   K 3.1* 06/20/2015 0941   K 3.3 12/21/2014 0857   K 3.5 11/09/2014 1343   CL 97* 12/21/2014 0857   CL 100 11/09/2014 1343   CO2 29 06/20/2015 0941   CO2 30 12/21/2014 0857   CO2 27 11/09/2014 1343   BUN 9.9 06/20/2015 0941   BUN 8 12/21/2014 0857   BUN 7 11/09/2014 1343   CREATININE 1.1 06/20/2015 0941   CREATININE 1.3* 12/21/2014 0857   CREATININE 0.94 11/09/2014 1343      Component Value Date/Time   CALCIUM 9.9 06/20/2015 0941   CALCIUM 9.2 12/21/2014 0857   CALCIUM 9.3 11/09/2014 1343   ALKPHOS 119 06/20/2015 0941   ALKPHOS 98* 12/21/2014 0857   ALKPHOS 102 11/09/2014 1343   AST 19 06/20/2015 0941   AST 28  12/21/2014 0857   AST 23 11/09/2014 1343   ALT 15 06/20/2015 0941   ALT 32 12/21/2014 0857   ALT 29 11/09/2014 1343   BILITOT 0.47 06/20/2015 0941   BILITOT 0.80 12/21/2014 0857   BILITOT 0.5  11/09/2014 1343     Impression and Plan: James Zuniga is a pleasant 60 yo African American male with multifactorial anemia and hypotestosteronemia. He is having some mild fatigue at this time.  We will see what his iron studies show.  He is now on 3 L supplemental O2 at night and continues to be followed closely by pulmonology.  He will get his testosterone injection today and we will keep him on his every 2 week schedule.   We will see him back in 2 months for labs and follow-up.   He knows to call here with any questions or concerns. We can certainly see him sooner if need be.   [name], NP 2/3/20178:53 AM

## 2015-08-16 NOTE — Patient Instructions (Signed)

## 2015-08-17 LAB — TESTOSTERONE: Testosterone, Serum: 630 ng/dL (ref 348–1197)

## 2015-08-30 ENCOUNTER — Ambulatory Visit (HOSPITAL_BASED_OUTPATIENT_CLINIC_OR_DEPARTMENT_OTHER): Payer: PPO

## 2015-08-30 VITALS — BP 124/93 | HR 72 | Temp 98.3°F | Resp 16

## 2015-08-30 DIAGNOSIS — E291 Testicular hypofunction: Secondary | ICD-10-CM

## 2015-08-30 DIAGNOSIS — D509 Iron deficiency anemia, unspecified: Secondary | ICD-10-CM

## 2015-08-30 MED ORDER — TESTOSTERONE CYPIONATE 200 MG/ML IM SOLN
INTRAMUSCULAR | Status: AC
  Filled 2015-08-30: qty 2

## 2015-08-30 MED ORDER — TESTOSTERONE CYPIONATE 200 MG/ML IM SOLN
300.0000 mg | INTRAMUSCULAR | Status: DC
  Administered 2015-08-30: 300 mg via INTRAMUSCULAR

## 2015-08-30 NOTE — Patient Instructions (Signed)

## 2015-09-04 DIAGNOSIS — I10 Essential (primary) hypertension: Secondary | ICD-10-CM | POA: Diagnosis not present

## 2015-09-04 DIAGNOSIS — B370 Candidal stomatitis: Secondary | ICD-10-CM | POA: Diagnosis not present

## 2015-09-04 DIAGNOSIS — G4700 Insomnia, unspecified: Secondary | ICD-10-CM | POA: Diagnosis not present

## 2015-09-04 DIAGNOSIS — Z9181 History of falling: Secondary | ICD-10-CM | POA: Diagnosis not present

## 2015-09-04 DIAGNOSIS — E119 Type 2 diabetes mellitus without complications: Secondary | ICD-10-CM | POA: Diagnosis not present

## 2015-09-05 DIAGNOSIS — Z9181 History of falling: Secondary | ICD-10-CM | POA: Insufficient documentation

## 2015-09-08 DIAGNOSIS — J441 Chronic obstructive pulmonary disease with (acute) exacerbation: Secondary | ICD-10-CM | POA: Diagnosis not present

## 2015-09-13 ENCOUNTER — Ambulatory Visit (HOSPITAL_BASED_OUTPATIENT_CLINIC_OR_DEPARTMENT_OTHER): Payer: PPO

## 2015-09-13 VITALS — BP 126/89 | HR 92 | Temp 98.4°F | Resp 16

## 2015-09-13 DIAGNOSIS — D509 Iron deficiency anemia, unspecified: Secondary | ICD-10-CM

## 2015-09-13 DIAGNOSIS — E291 Testicular hypofunction: Secondary | ICD-10-CM | POA: Diagnosis not present

## 2015-09-13 MED ORDER — TESTOSTERONE CYPIONATE 200 MG/ML IM SOLN
300.0000 mg | INTRAMUSCULAR | Status: DC
  Administered 2015-09-13: 300 mg via INTRAMUSCULAR

## 2015-09-13 MED ORDER — TESTOSTERONE CYPIONATE 200 MG/ML IM SOLN
INTRAMUSCULAR | Status: AC
  Filled 2015-09-13: qty 2

## 2015-09-13 NOTE — Patient Instructions (Signed)

## 2015-09-25 ENCOUNTER — Ambulatory Visit: Payer: PPO

## 2015-09-25 ENCOUNTER — Emergency Department (HOSPITAL_BASED_OUTPATIENT_CLINIC_OR_DEPARTMENT_OTHER): Payer: PPO

## 2015-09-25 ENCOUNTER — Encounter (HOSPITAL_BASED_OUTPATIENT_CLINIC_OR_DEPARTMENT_OTHER): Payer: Self-pay

## 2015-09-25 ENCOUNTER — Emergency Department (HOSPITAL_BASED_OUTPATIENT_CLINIC_OR_DEPARTMENT_OTHER)
Admission: EM | Admit: 2015-09-25 | Discharge: 2015-09-25 | Disposition: A | Discharge status: Home / Self Care | Payer: PPO | Attending: Emergency Medicine | Admitting: Emergency Medicine

## 2015-09-25 DIAGNOSIS — I2510 Atherosclerotic heart disease of native coronary artery without angina pectoris: Secondary | ICD-10-CM | POA: Diagnosis not present

## 2015-09-25 DIAGNOSIS — I10 Essential (primary) hypertension: Secondary | ICD-10-CM | POA: Diagnosis not present

## 2015-09-25 DIAGNOSIS — R079 Chest pain, unspecified: Secondary | ICD-10-CM | POA: Diagnosis not present

## 2015-09-25 DIAGNOSIS — R05 Cough: Secondary | ICD-10-CM | POA: Diagnosis not present

## 2015-09-25 DIAGNOSIS — Z7982 Long term (current) use of aspirin: Secondary | ICD-10-CM | POA: Diagnosis not present

## 2015-09-25 DIAGNOSIS — E119 Type 2 diabetes mellitus without complications: Secondary | ICD-10-CM | POA: Insufficient documentation

## 2015-09-25 DIAGNOSIS — R42 Dizziness and giddiness: Secondary | ICD-10-CM | POA: Insufficient documentation

## 2015-09-25 DIAGNOSIS — R413 Other amnesia: Secondary | ICD-10-CM | POA: Diagnosis not present

## 2015-09-25 DIAGNOSIS — E876 Hypokalemia: Secondary | ICD-10-CM | POA: Insufficient documentation

## 2015-09-25 DIAGNOSIS — Z862 Personal history of diseases of the blood and blood-forming organs and certain disorders involving the immune mechanism: Secondary | ICD-10-CM | POA: Insufficient documentation

## 2015-09-25 DIAGNOSIS — R251 Tremor, unspecified: Secondary | ICD-10-CM | POA: Diagnosis not present

## 2015-09-25 DIAGNOSIS — G8929 Other chronic pain: Secondary | ICD-10-CM | POA: Insufficient documentation

## 2015-09-25 DIAGNOSIS — R410 Disorientation, unspecified: Secondary | ICD-10-CM | POA: Diagnosis not present

## 2015-09-25 DIAGNOSIS — J441 Chronic obstructive pulmonary disease with (acute) exacerbation: Secondary | ICD-10-CM | POA: Insufficient documentation

## 2015-09-25 DIAGNOSIS — R109 Unspecified abdominal pain: Secondary | ICD-10-CM | POA: Insufficient documentation

## 2015-09-25 DIAGNOSIS — I509 Heart failure, unspecified: Secondary | ICD-10-CM | POA: Diagnosis not present

## 2015-09-25 DIAGNOSIS — M109 Gout, unspecified: Secondary | ICD-10-CM | POA: Insufficient documentation

## 2015-09-25 DIAGNOSIS — K219 Gastro-esophageal reflux disease without esophagitis: Secondary | ICD-10-CM | POA: Insufficient documentation

## 2015-09-25 DIAGNOSIS — E785 Hyperlipidemia, unspecified: Secondary | ICD-10-CM | POA: Diagnosis not present

## 2015-09-25 DIAGNOSIS — Z79899 Other long term (current) drug therapy: Secondary | ICD-10-CM | POA: Insufficient documentation

## 2015-09-25 DIAGNOSIS — E559 Vitamin D deficiency, unspecified: Secondary | ICD-10-CM | POA: Diagnosis not present

## 2015-09-25 DIAGNOSIS — M545 Low back pain: Secondary | ICD-10-CM | POA: Insufficient documentation

## 2015-09-25 DIAGNOSIS — Z87448 Personal history of other diseases of urinary system: Secondary | ICD-10-CM | POA: Diagnosis not present

## 2015-09-25 DIAGNOSIS — F17210 Nicotine dependence, cigarettes, uncomplicated: Secondary | ICD-10-CM | POA: Diagnosis not present

## 2015-09-25 DIAGNOSIS — G4700 Insomnia, unspecified: Secondary | ICD-10-CM | POA: Insufficient documentation

## 2015-09-25 LAB — CBC WITH DIFFERENTIAL/PLATELET
BASOS ABS: 0 10*3/uL (ref 0.0–0.1)
BASOS PCT: 0 %
EOS ABS: 0.1 10*3/uL (ref 0.0–0.7)
Eosinophils Relative: 2 %
HEMATOCRIT: 44.2 % (ref 39.0–52.0)
HEMOGLOBIN: 14.5 g/dL (ref 13.0–17.0)
Lymphocytes Relative: 22 %
Lymphs Abs: 1.2 10*3/uL (ref 0.7–4.0)
MCH: 30 pg (ref 26.0–34.0)
MCHC: 32.8 g/dL (ref 30.0–36.0)
MCV: 91.3 fL (ref 78.0–100.0)
MONO ABS: 0.6 10*3/uL (ref 0.1–1.0)
MONOS PCT: 11 %
NEUTROS ABS: 3.7 10*3/uL (ref 1.7–7.7)
NEUTROS PCT: 65 %
Platelets: 264 10*3/uL (ref 150–400)
RBC: 4.84 MIL/uL (ref 4.22–5.81)
RDW: 16.6 % — AB (ref 11.5–15.5)
WBC: 5.7 10*3/uL (ref 4.0–10.5)

## 2015-09-25 LAB — BASIC METABOLIC PANEL
ANION GAP: 10 (ref 5–15)
BUN: 6 mg/dL (ref 6–20)
CALCIUM: 8.7 mg/dL — AB (ref 8.9–10.3)
CO2: 26 mmol/L (ref 22–32)
CREATININE: 1 mg/dL (ref 0.61–1.24)
Chloride: 101 mmol/L (ref 101–111)
GFR calc non Af Amer: >60 mL/min (ref >60)
Glucose, Bld: 139 mg/dL — ABNORMAL HIGH (ref 65–99)
Potassium: 2.8 mmol/L — ABNORMAL LOW (ref 3.5–5.1)
SODIUM: 137 mmol/L (ref 135–145)

## 2015-09-25 LAB — CBG MONITORING, ED: GLUCOSE-CAPILLARY: 142 mg/dL — AB (ref 65–99)

## 2015-09-25 LAB — TROPONIN I

## 2015-09-25 MED ORDER — ALBUTEROL SULFATE HFA 108 (90 BASE) MCG/ACT IN AERS: 1.0000 | INHALATION_SPRAY | Freq: Four times a day (QID) | RESPIRATORY_TRACT | Status: DC | PRN

## 2015-09-25 MED ORDER — POTASSIUM CHLORIDE ER 10 MEQ PO TBCR: 10.0000 meq | EXTENDED_RELEASE_TABLET | Freq: Three times a day (TID) | ORAL | Status: DC

## 2015-09-25 MED ORDER — POTASSIUM CHLORIDE 10 MEQ/100ML IV SOLN
10.0000 meq | Freq: Once | INTRAVENOUS | Status: AC
  Administered 2015-09-25: 10 meq via INTRAVENOUS
  Filled 2015-09-25: qty 100

## 2015-09-25 MED ORDER — POTASSIUM CHLORIDE 10 MEQ/100ML IV SOLN
10.0000 meq | INTRAVENOUS | Status: DC
Start: 2015-09-25 — End: 2015-09-25

## 2015-09-25 MED ORDER — METHYLPREDNISOLONE SODIUM SUCC 125 MG IJ SOLR
125.0000 mg | Freq: Once | INTRAMUSCULAR | Status: AC
  Administered 2015-09-25: 125 mg via INTRAVENOUS
  Filled 2015-09-25: qty 2

## 2015-09-25 MED ORDER — GUAIFENESIN-CODEINE 100-10 MG/5ML PO SYRP: 5.0000 mL | ORAL_SOLUTION | Freq: Three times a day (TID) | ORAL | Status: DC | PRN

## 2015-09-25 MED ORDER — IPRATROPIUM-ALBUTEROL 0.5-2.5 (3) MG/3ML IN SOLN
3.0000 mL | RESPIRATORY_TRACT | Status: DC
Start: 2015-09-25 — End: 2015-09-26
  Administered 2015-09-25: 3 mL via RESPIRATORY_TRACT
  Filled 2015-09-25: qty 3

## 2015-09-25 MED ORDER — POTASSIUM CHLORIDE CRYS ER 20 MEQ PO TBCR
40.0000 meq | EXTENDED_RELEASE_TABLET | Freq: Once | ORAL | Status: AC
  Administered 2015-09-25: 40 meq via ORAL
  Filled 2015-09-25: qty 2

## 2015-09-25 MED ORDER — HYDROCODONE-ACETAMINOPHEN 7.5-325 MG/15ML PO SOLN
10.0000 mL | Freq: Once | ORAL | Status: AC
  Administered 2015-09-25: 10 mL via ORAL
  Filled 2015-09-25: qty 15

## 2015-09-25 MED ORDER — DOXYCYCLINE HYCLATE 100 MG PO CAPS: 100.0000 mg | ORAL_CAPSULE | Freq: Two times a day (BID) | ORAL | Status: DC

## 2015-09-25 NOTE — Discharge Instructions (Signed)
Increase your potassium to 3 times per day for the next 10 days, then decreased back to your normal twice per day. Be careful NOT to take both your new prescription, AND your old prescription!!   Chronic Obstructive Pulmonary Disease Exacerbation Chronic obstructive pulmonary disease (COPD) is a common lung problem. In COPD, the flow of air from the lungs is limited. COPD exacerbations are times that breathing gets worse and you need extra treatment. Without treatment they can be life threatening. If they happen often, your lungs can become more damaged. If your COPD gets worse, your doctor may treat you with:  Medicines.  Oxygen.  Different ways to clear your airway, such as using a mask. HOME CARE  Do not smoke.  Avoid tobacco smoke and other things that bother your lungs.  If given, take your antibiotic medicine as told. Finish the medicine even if you start to feel better.  Only take medicines as told by your doctor.  Drink enough fluids to keep your pee (urine) clear or pale yellow (unless your doctor has told you not to).  Use a cool mist machine (vaporizer).  If you use oxygen or a machine that turns liquid medicine into a mist (nebulizer), continue to use them as told.  Keep up with shots (vaccinations) as told by your doctor.  Exercise regularly.  Eat healthy foods.  Keep all doctor visits as told. GET HELP RIGHT AWAY IF:  You are very short of breath and it gets worse.  You have trouble talking.  You have bad chest pain.  You have blood in your spit (sputum).  You have a fever.  You keep throwing up (vomiting).  You feel weak, or you pass out (faint).  You feel confused.  You keep getting worse. MAKE SURE YOU:  Understand these instructions.  Will watch your condition.  Will get help right away if you are not doing well or get worse.   This information is not intended to replace advice given to you by your health care provider. Make sure you  discuss any questions you have with your health care provider.   Document Released: 06/18/2011 Document Revised: 07/20/2014 Document Reviewed: 03/03/2013 Elsevier Interactive Patient Education 2016 [employer].  Hypokalemia Hypokalemia means that the amount of potassium in the blood is lower than normal.Potassium is a chemical, called an electrolyte, that helps regulate the amount of fluid in the body. It also stimulates muscle contraction and helps nerves function properly.Most of the body's potassium is inside of cells, and only a very small amount is in the blood. Because the amount in the blood is so small, minor changes can be life-threatening. CAUSES  Antibiotics.  Diarrhea or vomiting.  Using laxatives too much, which can cause diarrhea.  Chronic kidney disease.  Water pills (diuretics).  Eating disorders (bulimia).  Low magnesium level.  Sweating a lot. SIGNS AND SYMPTOMS  Weakness.  Constipation.  Fatigue.  Muscle cramps.  Mental confusion.  Skipped heartbeats or irregular heartbeat (palpitations).  Tingling or numbness. DIAGNOSIS  Your health care provider can diagnose hypokalemia with blood tests. In addition to checking your potassium level, your health care provider may also check other lab tests. TREATMENT Hypokalemia can be treated with potassium supplements taken by mouth or adjustments in your current medicines. If your potassium level is very low, you may need to get potassium through a vein (IV) and be monitored in the hospital. A diet high in potassium is also helpful. Foods high in potassium are:  Nuts, such as peanuts and pistachios.  Seeds, such as sunflower seeds and pumpkin seeds.  Peas, lentils, and lima beans.  Whole grain and bran cereals and breads.  Fresh fruit and vegetables, such as apricots, avocado, bananas, cantaloupe, kiwi, oranges, tomatoes, asparagus, and potatoes.  Orange and tomato juices.  Red meats.  Fruit  yogurt. HOME CARE INSTRUCTIONS  Take all medicines as prescribed by your health care provider.  Maintain a healthy diet by including nutritious food, such as fruits, vegetables, nuts, whole grains, and lean meats.  If you are taking a laxative, be sure to follow the directions on the label. SEEK MEDICAL CARE IF:  Your weakness gets worse.  You feel your heart pounding or racing.  You are vomiting or having diarrhea.  You are diabetic and having trouble keeping your blood glucose in the normal range. SEEK IMMEDIATE MEDICAL CARE IF:  You have chest pain, shortness of breath, or dizziness.  You are vomiting or having diarrhea for more than 2 days.  You faint. MAKE SURE YOU:   Understand these instructions.  Will watch your condition.  Will get help right away if you are not doing well or get worse.   This information is not intended to replace advice given to you by your health care provider. Make sure you discuss any questions you have with your health care provider.   Document Released: 06/29/2005 Document Revised: 07/20/2014 Document Reviewed: 12/30/2012 Elsevier Interactive Patient Education [employer].

## 2015-09-25 NOTE — ED Provider Notes (Signed)
CSN: [id number]     Arrival date & time 09/25/15  1925 History  By signing my name below, I, Rowan Blase, attest that this documentation has been prepared under the direction and in the presence of Tanna Furry, MD . Electronically Signed: Rowan Blase, Scribe. 09/25/2015. 8:08 PM.   Chief Complaint  Patient presents with  . Chest Pain   The history is provided by the patient and a relative. No language interpreter was used.   HPI Comments:  PRUDENCIO VELAZCO is a 60 y.o. male with PMHx of CHF, CAD, DM, HTN, HLD, emphysema, and renal disorder who presents to the Emergency Department complaining of sharp, intermittent, low anterior chest pain for the past two days, occasionally worsened with deep breath. Pt reports associated abdominal pain, intermittent productive cough onset two days ago, tremors, chills, sore throat and moderate chronic back pain. Pt states he was forgetting things and had a near fall today; daughters report pt was having difficulty with balance and was responding at a slow pace. He takes nitroglycerin 2-3 times per week for similar pain with relief; pt can't remember if he took NTG today. Pt took Dayquil earlier today, but daughters state he may not have taken any of his prescriptions this morning. Pt notes fall ~ 2 months ago and other near-falls. Family is unsure if pt has had previous episodes of forgetfulness. Pt uses oxygen at home as needed and gets testosterone injections every 2 weeks. Daughter reports pt had a valve procedure at [facility] 3 years ago. Daughters express concern for blood clot and report pt's brother passed at 13 from a blood clot. Pt denies headache.   Past Medical History  Diagnosis Date  . CHF (congestive heart failure) ([place])   . Coronary artery disease   . Diabetes mellitus   . Hypertension   . Renal disorder   . Hyperlipidemia   . Iron deficiency anemia, unspecified 07/01/2013  . Hypotestosteronism 07/01/2013  . Chronic pain   . Vitamin D  deficiency   . Gout   . Esophageal reflux   . Chronic airway obstruction ([place])   . Insomnia   . Memory loss   . Disorder of liver   . PVD (peripheral vascular disease) ([place])   . Hypokalemia    Past Surgical History  Procedure Laterality Date  . No past surgeries     Family History  Problem Relation Age of Onset  . Heart disease Mother   . Stroke Brother   . Diabetes Father    Social History  Substance Use Topics  . Smoking status: Current Every Day Smoker -- 1.00 packs/day for 30 years    Types: Cigarettes    Start date: 08/05/1983    Last Attempt to Quit: 06/28/2014  . Smokeless tobacco: Never Used     Comment: quit smoking 4 months ago  . Alcohol Use: No    Review of Systems  Constitutional: Positive for chills. Negative for fever, diaphoresis, appetite change and fatigue.  HENT: Positive for sore throat. Negative for mouth sores and trouble swallowing.   Eyes: Negative for visual disturbance.  Respiratory: Positive for cough. Negative for chest tightness, shortness of breath and wheezing.   Cardiovascular: Positive for chest pain.  Gastrointestinal: Positive for abdominal pain. Negative for nausea, vomiting, diarrhea and abdominal distention.  Endocrine: Negative for polydipsia, polyphagia and polyuria.  Genitourinary: Negative for dysuria, frequency and hematuria.  Musculoskeletal: Positive for back pain. Negative for gait problem.  Skin: Negative for color change, pallor  and rash.  Neurological: Positive for dizziness and tremors. Negative for syncope, light-headedness and headaches.  Hematological: Does not bruise/bleed easily.  Psychiatric/Behavioral: Positive for confusion. Negative for behavioral problems.   Allergies  Review of patient's allergies indicates no known allergies.  Home Medications   Prior to Admission medications   Medication Sig Start Date End Date Taking? Authorizing Provider  albuterol (PROVENTIL HFA;VENTOLIN HFA) 108 (90 Base) MCG/ACT  inhaler Inhale 1-2 puffs into the lungs every 6 (six) hours as needed for wheezing. 09/25/15   Tanna Furry, MD  allopurinol (ZYLOPRIM) 300 MG tablet Take 300 mg by mouth daily.    Historical Provider, MD  amLODipine (NORVASC) 10 MG tablet 10 mg daily. 01/09/15   Historical Provider, MD  aspirin 81 MG EC tablet Take 81 mg by mouth daily. Swallow whole.    Historical Provider, MD  atorvastatin (LIPITOR) 10 MG tablet TAKE 1 TABLET BY MOUTH DAILY 06/24/15   Historical Provider, MD  Blood Glucose Monitoring Suppl (FIFTY50 GLUCOSE METER 2.0) w/Device KIT Use as instructed 08/14/14   Historical Provider, MD  BREO ELLIPTA 100-25 MCG/INH AEPB INL 1 PUFF PO D 07/22/15   Tammy S Parrett, NP  Cholecalciferol (VITAMIN D3) 1000 UNITS CAPS Take by mouth.    Historical Provider, MD  diclofenac sodium (VOLTAREN) 1 % GEL Apply topically. 10/04/14   Historical Provider, MD  doxycycline (VIBRAMYCIN) 100 MG capsule Take 1 capsule (100 mg total) by mouth 2 (two) times daily. 09/25/15   Tanna Furry, MD  DULoxetine (CYMBALTA) 60 MG capsule Take one daily 02/04/15 02/04/19  Historical Provider, MD  furosemide (LASIX) 20 MG tablet Take 1 tablet by mouth 2 (two) times daily. 03/21/13   Historical Provider, MD  gabapentin (NEURONTIN) 300 MG capsule TAKE 1 CAPSULE BY MOUTH THREE TIMES DAILY 10/18/14   Historical Provider, MD  guaiFENesin-codeine (CHERATUSSIN AC) 100-10 MG/5ML syrup Take 5 mLs by mouth 3 (three) times daily as needed for cough. 09/25/15   Tanna Furry, MD  losartan (COZAAR) 50 MG tablet Take 50 mg by mouth daily.  08/11/13   Historical Provider, MD  metoprolol tartrate (LOPRESSOR) 25 MG tablet Take 25 mg by mouth daily.  09/14/13   Historical Provider, MD  omeprazole (PRILOSEC) 20 MG capsule Take 20 mg by mouth daily.  07/19/14   Historical Provider, MD  potassium chloride (K-DUR) 10 MEQ tablet Take 1 tablet (10 mEq total) by mouth 3 (three) times daily. 09/25/15   Tanna Furry, MD  potassium chloride SA (K-DUR,KLOR-CON) 20 MEQ tablet  Take 1 tablet (20 mEq total) by mouth 2 (two) times daily. 05/03/15   Volanda Napoleon, MD  sertraline (ZOLOFT) 50 MG tablet 50 mg. Reported on 07/22/2015 01/07/15 01/07/19  Historical Provider, MD  sildenafil (VIAGRA) 50 MG tablet Take 50 mg by mouth. 09/04/15 09/03/16  Historical Provider, MD  tiotropium (SPIRIVA) 18 MCG inhalation capsule Place 1 capsule (18 mcg total) into inhaler and inhale daily. 07/22/15   Tammy S Parrett, NP  tiZANidine (ZANAFLEX) 4 MG tablet 4 mg 3 (three) times daily as needed. 10/04/14   Historical Provider, MD  traMADol (ULTRAM) 50 MG tablet Take 50 mg by mouth daily as needed for moderate pain.  09/08/13   Historical Provider, MD  zolpidem (AMBIEN) 5 MG tablet Take 5 mg by mouth at bedtime.    Historical Provider, MD   BP 110/82 mmHg  Pulse 83  Temp(Src) 98.4 F (36.9 C) (Oral)  Resp 15  Ht '5\' 6"'  (1.676 m)  Wt 205 lb (  92.987 kg)  BMI 33.10 kg/m2  SpO2 98% Physical Exam  Constitutional: He is oriented to person, place, and time. He appears well-developed and well-nourished. No distress.  HENT:  Head: Normocephalic and atraumatic.  Right Ear: Hearing normal.  Left Ear: Hearing normal.  Nose: Nose normal.  Mouth/Throat: Oropharynx is clear and moist and mucous membranes are normal.  Eyes: Conjunctivae and EOM are normal. Pupils are equal, round, and reactive to light.  Neck: Normal range of motion. Neck supple.  Cardiovascular: Regular rhythm, S1 normal and S2 normal.  Exam reveals no gallop and no friction rub.   No murmur heard. Pulmonary/Chest: Effort normal. No respiratory distress. He has wheezes. He exhibits no tenderness.  wheezing and prolongation  Abdominal: Soft. Normal appearance and bowel sounds are normal. There is no hepatosplenomegaly. There is no tenderness. There is no rebound, no guarding, no tenderness at McBurney's point and negative Murphy's sign. No hernia.  Musculoskeletal: Normal range of motion.  Neurological: He is alert and oriented to  person, place, and time. He has normal strength. No cranial nerve deficit or sensory deficit. Coordination normal. GCS eye subscore is 4. GCS verbal subscore is 5. GCS motor subscore is 6.  No pronator drift; Slow to respond but appropriate  Skin: Skin is warm, dry and intact. No rash noted. No cyanosis.  Psychiatric: He has a normal mood and affect. His speech is normal and behavior is normal. Thought content normal.  Nursing note and vitals reviewed.  ED Course  Procedures  DIAGNOSTIC STUDIES:  Oxygen Saturation is 97% on RA, normal by my interpretation.    COORDINATION OF CARE:  8:01 PM Will administer medications. Will order chest x-ray and head CT. Discussed treatment plan with pt at bedside and pt agreed to plan.  Labs Review Labs Reviewed  CBC WITH DIFFERENTIAL/PLATELET - Abnormal; Notable for the following:    RDW 16.6 (*)    All other components within normal limits  BASIC METABOLIC PANEL - Abnormal; Notable for the following:    Potassium 2.8 (*)    Glucose, Bld 139 (*)    Calcium 8.7 (*)    All other components within normal limits  CBG MONITORING, ED - Abnormal; Notable for the following:    Glucose-Capillary 142 (*)    All other components within normal limits  TROPONIN I    Imaging Review Dg Chest 2 View  09/25/2015  CLINICAL DATA:  60 year old male with mid chest pain, cough and dizziness today. EXAM: CHEST  2 VIEW COMPARISON:  Chest x-ray 11/15/2011. FINDINGS: Lung volumes are normal. No consolidative airspace disease. No pleural effusions. No pneumothorax. No pulmonary nodule or mass noted. Pulmonary vasculature and the cardiomediastinal silhouette are within normal limits. IMPRESSION: No radiographic evidence of acute cardiopulmonary disease. Electronically Signed   By: Vinnie Langton M.D.   On: 09/25/2015 20:27   Ct Head Wo Contrast  09/25/2015  CLINICAL DATA:  Memory changes, hypertension. EXAM: CT HEAD WITHOUT CONTRAST TECHNIQUE: Contiguous axial images  were obtained from the base of the skull through the vertex without intravenous contrast. COMPARISON:  None. FINDINGS: Brain: There is mild generalized brain atrophy with commensurate dilatation of the sulci. Ventricles are normal in size and configuration. Very mild chronic small vessel ischemic change noted within the deep periventricular white matter regions. All other areas of the brain demonstrate normal gray-white matter attenuation. There is no mass, hemorrhage, edema or other evidence of acute parenchymal abnormality. No extra-axial hemorrhage. Vascular: No hyperdense vessel or unexpected calcification. Skull:  Negative for fracture or focal lesion. Sinuses/Orbits: No acute findings. Other: None. IMPRESSION: No acute findings.  No intracranial mass, hemorrhage or edema. Mild atrophy and mild chronic small vessel ischemic change in the deep white matter. Electronically Signed   By: Franki Cabot M.D.   On: 09/25/2015 20:32   I have personally reviewed and evaluated these images and lab results as part of my medical decision-making.   EKG Interpretation   Date/Time:  Wednesday September 25 2015 19:33:45 EDT Ventricular Rate:  86 PR Interval:  158 QRS Duration: 85 QT Interval:  346 QTC Calculation: 414 R Axis:   -61 Text Interpretation:  Sinus rhythm Probable left atrial enlargement  Abnormal R-wave progression, early transition Inferior infarct, old  Confirmed by Jeneen Rinks  MD, [place] ([id number]) on 09/25/2015 7:39:31 PM      MDM   Final diagnoses:  COPD exacerbation ([place])  Hypokalemia    Patient is a rather circuitous historian.  Ultimately, his daughters help straighten out what has been going on. He was weak and fatigued today. He has been more short of breath over the last few days. His chest hurts only when he coughs. He does not have pneumonia or hypoxemia.  Likely no signs of stroke, no acute changes on EKG, CT, or chest x-ray. We are supplementing his potassium. After which, he is  appropriate for discharge home. Treatment for his COPD exacerbation with doxycycline, prednisone, albuterol, cough medicine. Increase his potassium to 3 times a day for 10 days and resume twice a day. Primary care follow-up. ER with acute changes.  Tanna Furry, MD 09/25/15 2212

## 2015-09-25 NOTE — ED Notes (Signed)
MD at bedside. 

## 2015-09-25 NOTE — ED Notes (Signed)
CP x 2 days-brought in to ED by daughters-pt with episode today where he had near syncopal episode-pt unsure if took NTG PTA-daughter received cal from her mother stating she was on the phone with pt when he started coughing and phone was dropped-pt is alert but states he can not recall events since going to the store this am-pt was able to stand and take steps to stretcher

## 2015-09-25 NOTE — Progress Notes (Signed)
RT discussed with patient on the importance of using his Breo and Spiriva medications as prescribed as a daily regimen.  Patient expressed understanding and said the had a follow up appointment with his Pulmonologist this month.

## 2015-09-25 NOTE — ED Notes (Signed)
Patient transported to CT 

## 2015-09-27 ENCOUNTER — Ambulatory Visit (HOSPITAL_BASED_OUTPATIENT_CLINIC_OR_DEPARTMENT_OTHER): Payer: PPO

## 2015-09-27 VITALS — BP 130/85 | HR 74 | Temp 97.5°F | Resp 20

## 2015-09-27 DIAGNOSIS — E291 Testicular hypofunction: Secondary | ICD-10-CM | POA: Diagnosis not present

## 2015-09-27 DIAGNOSIS — D509 Iron deficiency anemia, unspecified: Secondary | ICD-10-CM

## 2015-09-27 MED ORDER — TESTOSTERONE CYPIONATE 200 MG/ML IM SOLN
300.0000 mg | INTRAMUSCULAR | Status: DC
  Administered 2015-09-27: 300 mg via INTRAMUSCULAR

## 2015-09-27 MED ORDER — TESTOSTERONE CYPIONATE 200 MG/ML IM SOLN
INTRAMUSCULAR | Status: AC
  Filled 2015-09-27: qty 2

## 2015-09-27 NOTE — Patient Instructions (Signed)

## 2015-10-01 DIAGNOSIS — M25561 Pain in right knee: Secondary | ICD-10-CM | POA: Diagnosis not present

## 2015-10-01 DIAGNOSIS — E118 Type 2 diabetes mellitus with unspecified complications: Secondary | ICD-10-CM | POA: Diagnosis not present

## 2015-10-01 DIAGNOSIS — E876 Hypokalemia: Secondary | ICD-10-CM | POA: Diagnosis not present

## 2015-10-01 DIAGNOSIS — M25562 Pain in left knee: Secondary | ICD-10-CM | POA: Diagnosis not present

## 2015-10-01 DIAGNOSIS — J441 Chronic obstructive pulmonary disease with (acute) exacerbation: Secondary | ICD-10-CM | POA: Diagnosis not present

## 2015-10-06 DIAGNOSIS — J441 Chronic obstructive pulmonary disease with (acute) exacerbation: Secondary | ICD-10-CM | POA: Diagnosis not present

## 2015-10-08 DIAGNOSIS — I10 Essential (primary) hypertension: Secondary | ICD-10-CM | POA: Diagnosis not present

## 2015-10-08 DIAGNOSIS — R61 Generalized hyperhidrosis: Secondary | ICD-10-CM | POA: Diagnosis not present

## 2015-10-08 DIAGNOSIS — R630 Anorexia: Secondary | ICD-10-CM | POA: Diagnosis not present

## 2015-10-11 ENCOUNTER — Ambulatory Visit (HOSPITAL_BASED_OUTPATIENT_CLINIC_OR_DEPARTMENT_OTHER): Payer: PPO

## 2015-10-11 ENCOUNTER — Ambulatory Visit (HOSPITAL_BASED_OUTPATIENT_CLINIC_OR_DEPARTMENT_OTHER): Payer: PPO | Admitting: Family

## 2015-10-11 ENCOUNTER — Other Ambulatory Visit (HOSPITAL_BASED_OUTPATIENT_CLINIC_OR_DEPARTMENT_OTHER): Payer: PPO

## 2015-10-11 VITALS — BP 118/66 | HR 84 | Temp 98.2°F | Resp 14 | Ht 66.0 in | Wt 205.0 lb

## 2015-10-11 DIAGNOSIS — E349 Endocrine disorder, unspecified: Secondary | ICD-10-CM

## 2015-10-11 DIAGNOSIS — K769 Liver disease, unspecified: Secondary | ICD-10-CM | POA: Diagnosis not present

## 2015-10-11 DIAGNOSIS — D509 Iron deficiency anemia, unspecified: Secondary | ICD-10-CM | POA: Diagnosis not present

## 2015-10-11 DIAGNOSIS — E291 Testicular hypofunction: Secondary | ICD-10-CM

## 2015-10-11 LAB — COMPREHENSIVE METABOLIC PANEL
ALK PHOS: 114 U/L (ref 40–150)
ALT: 45 U/L (ref 0–55)
AST: 23 U/L (ref 5–34)
Albumin: 3.2 g/dL — ABNORMAL LOW (ref 3.5–5.0)
Anion Gap: 10 mEq/L (ref 3–11)
BILIRUBIN TOTAL: 0.52 mg/dL (ref 0.20–1.20)
BUN: 11.1 mg/dL (ref 7.0–26.0)
CHLORIDE: 104 meq/L (ref 98–109)
CO2: 27 mEq/L (ref 22–29)
CREATININE: 1.1 mg/dL (ref 0.7–1.3)
Calcium: 9 mg/dL (ref 8.4–10.4)
EGFR: 86 mL/min/{1.73_m2} — ABNORMAL LOW (ref >90)
Glucose: 167 mg/dl — ABNORMAL HIGH (ref 70–140)
Potassium: 3.2 mEq/L — ABNORMAL LOW (ref 3.5–5.1)
SODIUM: 141 meq/L (ref 136–145)
TOTAL PROTEIN: 7.2 g/dL (ref 6.4–8.3)

## 2015-10-11 LAB — IRON AND TIBC
%SAT: 33 % (ref 20–55)
IRON: 85 ug/dL (ref 42–163)
TIBC: 259 ug/dL (ref 202–409)
UIBC: 174 ug/dL (ref 117–376)

## 2015-10-11 LAB — CBC WITH DIFFERENTIAL (CANCER CENTER ONLY)
BASO#: 0 10*3/uL (ref 0.0–0.2)
BASO%: 0.3 % (ref 0.0–2.0)
EOS%: 2.4 % (ref 0.0–7.0)
Eosinophils Absolute: 0.2 10*3/uL (ref 0.0–0.5)
HCT: 45.4 % (ref 38.7–49.9)
HEMOGLOBIN: 15.2 g/dL (ref 13.0–17.1)
LYMPH#: 2.7 10*3/uL (ref 0.9–3.3)
LYMPH%: 34.5 % (ref 14.0–48.0)
MCH: 30.2 pg (ref 28.0–33.4)
MCHC: 33.5 g/dL (ref 32.0–35.9)
MCV: 90 fL (ref 82–98)
MONO#: 0.6 10*3/uL (ref 0.1–0.9)
MONO%: 7.6 % (ref 0.0–13.0)
NEUT%: 55.2 % (ref 40.0–80.0)
NEUTROS ABS: 4.3 10*3/uL (ref 1.5–6.5)
PLATELETS: 328 10*3/uL (ref 145–400)
RBC: 5.03 10*6/uL (ref 4.20–5.70)
RDW: 16.6 % — ABNORMAL HIGH (ref 11.1–15.7)
WBC: 7.8 10*3/uL (ref 4.0–10.0)

## 2015-10-11 LAB — FERRITIN: FERRITIN: 579 ng/mL — AB (ref 22–316)

## 2015-10-11 MED ORDER — TESTOSTERONE CYPIONATE 200 MG/ML IM SOLN
INTRAMUSCULAR | Status: AC
  Filled 2015-10-11: qty 2

## 2015-10-11 MED ORDER — TESTOSTERONE CYPIONATE 200 MG/ML IM SOLN
300.0000 mg | INTRAMUSCULAR | Status: DC
  Administered 2015-10-11: 300 mg via INTRAMUSCULAR

## 2015-10-11 NOTE — Patient Instructions (Signed)

## 2015-10-11 NOTE — Progress Notes (Signed)
Hematology and Oncology Follow Up Visit  James Zuniga [id number] 11/05/1955 60 y.o. 10/11/2015   Principle Diagnosis:  Iron deficiency anemia Hypotestosteronemia Chronic liver lesions  Current Therapy:   IV iron as indicated - last received in December 2016 Depotestosterone 400 mg IM Q2 weeks    Interim History: James Zuniga is here today for a follow-up. He has had some fatigue and SOB with exertion.  He had a "cold" 2 weeks ago and went to the ED for evaluation. His chest xray was negative. He is using his inhaler as prescribed and supplemental O2 at 2.5L at night PRN.  No fever, chills, n/v, cough, rash, headache, dizziness, blurred vision, chest pain, palpitations, abdominal pain or changes in bowel or bladder habits.  He has occasional swelling in his feet and ankles and is on Lasix BID. No tenderness, numbness or tingling in his extremities.  He states that his blood sugars have been fairly well controlled averaging in the 130's. His appetite has been a little down but he is eating and supplementing with ensure. He is staying well hydrated but states that he needs to cut back on his soda intake. His weight is stable.   Medications:    Medication List       This list is accurate as of: 10/11/15  8:43 AM.  Always use your most recent med list.               albuterol 108 (90 Base) MCG/ACT inhaler  Commonly known as:  PROVENTIL HFA;VENTOLIN HFA  Inhale 1-2 puffs into the lungs every 6 (six) hours as needed for wheezing.     allopurinol 300 MG tablet  Commonly known as:  ZYLOPRIM  Take 300 mg by mouth daily.     amLODipine 10 MG tablet  Commonly known as:  NORVASC  10 mg daily.     aspirin 81 MG EC tablet  Take 81 mg by mouth daily. Swallow whole.     atorvastatin 10 MG tablet  Commonly known as:  LIPITOR  TAKE 1 TABLET BY MOUTH DAILY     B-D ULTRA-FINE 33 LANCETS Misc  by Does not apply route.     BREO ELLIPTA 100-25 MCG/INH Aepb  Generic drug:  fluticasone  furoate-vilanterol  INL 1 PUFF PO D     CYMBALTA 60 MG capsule  Generic drug:  DULoxetine  Take one daily     diclofenac sodium 1 % Gel  Commonly known as:  VOLTAREN  Apply topically.     doxycycline 100 MG capsule  Commonly known as:  VIBRAMYCIN  Take 1 capsule (100 mg total) by mouth 2 (two) times daily.     FIFTY50 GLUCOSE METER 2.0 w/Device Kit  Use as instructed     furosemide 20 MG tablet  Commonly known as:  LASIX  Take 1 tablet by mouth 2 (two) times daily.     gabapentin 300 MG capsule  Commonly known as:  NEURONTIN  TAKE 1 CAPSULE BY MOUTH THREE TIMES DAILY     guaiFENesin-codeine 100-10 MG/5ML syrup  Commonly known as:  CHERATUSSIN AC  Take 5 mLs by mouth 3 (three) times daily as needed for cough.     ibuprofen 600 MG tablet  Commonly known as:  ADVIL,MOTRIN  600 mg.     losartan 50 MG tablet  Commonly known as:  COZAAR  Take 50 mg by mouth daily.     metoprolol tartrate 25 MG tablet  Commonly known as:  LOPRESSOR  Take  25 mg by mouth daily.     omeprazole 20 MG capsule  Commonly known as:  PRILOSEC  Take 20 mg by mouth daily.     ONE TOUCH ULTRA TEST test strip  Generic drug:  glucose blood  2 (two) times daily. for testing     potassium chloride 10 MEQ tablet  Commonly known as:  K-DUR  Take 1 tablet (10 mEq total) by mouth 3 (three) times daily.     potassium chloride SA 20 MEQ tablet  Commonly known as:  K-DUR,KLOR-CON  Take 1 tablet (20 mEq total) by mouth 2 (two) times daily.     tiotropium 18 MCG inhalation capsule  Commonly known as:  SPIRIVA  Place 1 capsule (18 mcg total) into inhaler and inhale daily.     tiZANidine 4 MG tablet  Commonly known as:  ZANAFLEX  4 mg 3 (three) times daily as needed.     traMADol 50 MG tablet  Commonly known as:  ULTRAM  Take 50 mg by mouth daily as needed for moderate pain.     VIAGRA 50 MG tablet  Generic drug:  sildenafil  Take 50 mg by mouth.     Vitamin D3 1000 units Caps  Take by mouth.      ZOLOFT 50 MG tablet  Generic drug:  sertraline  50 mg. Reported on 07/22/2015     zolpidem 5 MG tablet  Commonly known as:  AMBIEN  Take 5 mg by mouth at bedtime.     zolpidem 10 MG tablet  Commonly known as:  AMBIEN  10 mg at bedtime as needed.        Allergies: No Known Allergies  Past Medical History, Surgical history, Social history, and Family History were reviewed and updated.  Review of Systems: All other 10 point review of systems is negative.   Physical Exam:  vitals were not taken for this visit.  Wt Readings from Last 3 Encounters:  10/11/15 205 lb (92.987 kg)  09/25/15 205 lb (92.987 kg)  08/16/15 205 lb (92.987 kg)    Ocular: Sclerae unicteric, pupils equal, round and reactive to light Ear-nose-throat: Oropharynx clear, dentition fair Lymphatic: No cervical supraclavicular or axillary adenopathy Lungs no rales or rhonchi, good excursion bilaterally Heart regular rate and rhythm, no murmur appreciated Abd soft, nontender, positive bowel sounds no liver or spleen tip palpated on exam, no fluid wave MSK no focal spinal tenderness, no joint edema Neuro: non-focal, well-oriented, appropriate affect Breast: Deferred   Lab Results  Component Value Date   WBC 7.8 10/11/2015   HGB 15.2 10/11/2015   HCT 45.4 10/11/2015   MCV 90 10/11/2015   PLT 328 10/11/2015   Lab Results  Component Value Date   FERRITIN 377* 08/16/2015   IRON 92 08/16/2015   TIBC 254 08/16/2015   UIBC 162 08/16/2015   IRONPCTSAT 36 08/16/2015   Lab Results  Component Value Date   RETICCTPCT 1.2 06/20/2015   RBC 5.03 10/11/2015   RETICCTABS 63.0 06/20/2015   No results found for: KPAFRELGTCHN, LAMBDASER, KAPLAMBRATIO No results found for: IGGSERUM, IGA, IGMSERUM No results found for: Ronnald Ramp, A1GS, A2GS, Tillman Sers, SPEI   Chemistry      Component Value Date/Time   NA 137 09/25/2015 1945   NA 143 08/16/2015 0819   NA 134 12/21/2014 0857     K 2.8* 09/25/2015 1945   K 3.2* 08/16/2015 0819   K 3.3 12/21/2014 0857   CL 101 09/25/2015 1945  CL 97* 12/21/2014 0857   CO2 26 09/25/2015 1945   CO2 26 08/16/2015 0819   CO2 30 12/21/2014 0857   BUN 6 09/25/2015 1945   BUN 10.0 08/16/2015 0819   BUN 8 12/21/2014 0857   CREATININE 1.00 09/25/2015 1945   CREATININE 1.1 08/16/2015 0819   CREATININE 1.3* 12/21/2014 0857      Component Value Date/Time   CALCIUM 8.7* 09/25/2015 1945   CALCIUM 9.4 08/16/2015 0819   CALCIUM 9.2 12/21/2014 0857   ALKPHOS 116 08/16/2015 0819   ALKPHOS 98* 12/21/2014 0857   ALKPHOS 102 11/09/2014 1343   AST 19 08/16/2015 0819   AST 28 12/21/2014 0857   AST 23 11/09/2014 1343   ALT 22 08/16/2015 0819   ALT 32 12/21/2014 0857   ALT 29 11/09/2014 1343   BILITOT 0.62 08/16/2015 0819   BILITOT 0.80 12/21/2014 0857   BILITOT 0.5 11/09/2014 1343     Impression and Plan: Mr. Moorman is a pleasant 60 yo African American male with multifactorial anemia and hypotestosteronemia. He is symptomatic at this time with fatigue.  We will see what his iron studies show and bring him in next week for an infusion if needed.   He will get his testosterone injection today and we will keep him on his every 2 week schedule.   We will see him back in 2 months for labs and follow-up.   He knows to call here with any questions or concerns. We can certainly see him sooner if need be.   [name], NP 3/31/20178:43 AM

## 2015-10-12 LAB — TESTOSTERONE: TESTOSTERONE: 497 ng/dL (ref 348–1197)

## 2015-10-25 ENCOUNTER — Ambulatory Visit (HOSPITAL_BASED_OUTPATIENT_CLINIC_OR_DEPARTMENT_OTHER): Payer: PPO

## 2015-10-25 VITALS — BP 128/83 | HR 67 | Temp 98.1°F | Resp 18

## 2015-10-25 DIAGNOSIS — E291 Testicular hypofunction: Secondary | ICD-10-CM | POA: Diagnosis not present

## 2015-10-25 DIAGNOSIS — D509 Iron deficiency anemia, unspecified: Secondary | ICD-10-CM

## 2015-10-25 MED ORDER — TESTOSTERONE CYPIONATE 200 MG/ML IM SOLN
300.0000 mg | INTRAMUSCULAR | Status: DC
  Administered 2015-10-25: 300 mg via INTRAMUSCULAR

## 2015-10-25 MED ORDER — TESTOSTERONE CYPIONATE 200 MG/ML IM SOLN
INTRAMUSCULAR | Status: AC
  Filled 2015-10-25: qty 2

## 2015-10-25 NOTE — Patient Instructions (Signed)

## 2015-10-29 ENCOUNTER — Ambulatory Visit (INDEPENDENT_AMBULATORY_CARE_PROVIDER_SITE_OTHER): Payer: PPO | Admitting: Internal Medicine

## 2015-10-29 ENCOUNTER — Encounter: Payer: Self-pay | Admitting: Internal Medicine

## 2015-10-29 VITALS — BP 128/74 | HR 75 | Ht 66.0 in | Wt 205.0 lb

## 2015-10-29 DIAGNOSIS — J449 Chronic obstructive pulmonary disease, unspecified: Secondary | ICD-10-CM | POA: Diagnosis not present

## 2015-10-29 DIAGNOSIS — R911 Solitary pulmonary nodule: Secondary | ICD-10-CM

## 2015-10-29 DIAGNOSIS — F17200 Nicotine dependence, unspecified, uncomplicated: Secondary | ICD-10-CM

## 2015-10-29 DIAGNOSIS — Z720 Tobacco use: Secondary | ICD-10-CM | POA: Diagnosis not present

## 2015-10-29 NOTE — Progress Notes (Signed)
Subjective:     Patient ID: James Zuniga, male   DOB: 1956/05/07, 60 y.o.   MRN: [id number]  HPI  OV 11/12/2014  Chief Complaint  Patient presents with  . Follow-up    Pt states his breathing has worsened since last OV. pt c/o increase in SOB, dry cough and CP with activity.     COPD  (no nocturnal desat sept 2015) :   No interim flare up. LAst seen Sept 2015. Using spriiva and symbicort and a compliant fashion. However, he is reporting worsening shortness of breath in the last 4 months. It is moderate to severe in intensity. It is class III in severity. Relieved by rest. No associated chest pain or wheezing or cough. He has mild occasional sputum that is brown in color but no change. He is not reporting any other symptoms of COPD exacerbation. He continues to live a sedentary lifestyle. Of note he has been on testosterone for the last several months to a year. Most recent hemoglobin was 16 g percent [lab test personally reviewed). There is no associated syncope. Oxygen desaturation test in September 2015 was normal. We have advised him there was no need for oxygen use. He is really frustrated by his worsening dyspnea. He wants to change his inhalers around. Spiriva and Symbicort are helping but he wants to try better one. Reportedly normal cardiac workup with [place] cardiologist  Smoking:   reports that he quit smoking about 4 months ago. His smoking use included Cigarettes. He started smoking about 31 years ago. He has a 30 pack-year smoking history. He has never used smokeless tobacco.   Lung nodule 70mm - stable aug 2014 and 2015. Next is September 2016   OV 10/29/2015  Chief Complaint  Patient presents with  . Follow-up    Pt went to ED in mid March for COPD exacerbation. Pt states his SOB is not back to baseline and chest tightness when SOB. Pt denies cough.     COPD: COPD cat score is 26. Overall he feels that he is stable. I last saw him almost one year ago. In the interim he saw  my nurse practitioner. A year ago changed his inhalers around but it didn't really did not help his dyspnea. However he admits that his dyspnea is unchanged in the last few years. But he does feel miserable. He somewhat reluctantly agree to attend pulmonary rehabilitation because of his need to attend Dr. visits. In the inferior agreed to go and get evaluated. He might have difficulty attending pulmonary rehabilitation because of his antalgic gait and using cane. He also wants to change his nocturnal oxygen from a nasal cannula to facemask. Review of the records suggest that he might not be desaturating at night but he is reluctant to give up his oxygen.  Lung nodule: 4 mm right upper lobe. Was originally seen in August 2014. Stable as of January 2017  Smoking: He says he quit smoking 1 month ago.  CAT COPD Symptom & Quality of Life Score (GSK trademark) 0 is no burden. 5 is highest burden 10/29/2015   Never Cough -> Cough all the time 3  No phlegm in chest -> Chest is full of phlegm 2  No chest tightness -> Chest feels very tight 3  No dyspnea for 1 flight stairs/[place] -> Very dyspneic for 1 flight of stairs 4  No limitations for ADL at home -> Very limited with ADL at home 3  Confident leaving home -> Not  at all confident leaving home 3  Sleep soundly -> Do not sleep soundly because of lung condition 4  Lots of Energy -> No energy at all 4  TOTAL Score (max 40)  26      Review of Systems     Objective:   Physical Exam Filed Vitals:   10/29/15 1356  BP: 128/74  Pulse: 75  Height: 5\' 6"  (1.676 m)  Weight: 205 lb (92.987 kg)  SpO2: 97%     Physical Exam Nursing note and vitals reviewed. Constitutional: He is oriented to person, place, and time. He appears well-developed and well-nourished. No distress.  HENT:  Head: Normocephalic and atraumatic.  Right Ear: External ear normal.  Left Ear: External ear normal.  Mouth/Throat: Oropharynx is clear and moist. No oropharyngeal  exudate.  Eyes: Conjunctivae normal and EOM are normal. Pupils are equal, round, and reactive to light. Right eye exhibits no discharge. Left eye exhibits no discharge. No scleral icterus.  Neck: Normal range of motion. Neck supple. No JVD present. No tracheal deviation present. No thyromegaly present.  Cardiovascular: Normal rate, regular rhythm and intact distal pulses.  Exam reveals no gallop and no friction rub.   No murmur heard. Pulmonary/Chest: Effort normal and breath sounds normal. No respiratory distress. He has no wheezes. He has no rales. He exhibits no tenderness.  Abdominal: Soft. Bowel sounds are normal. He exhibits no distension and no mass. There is no tenderness. There is no rebound and no guarding.  Musculoskeletal: Normal range of motion. He exhibits no edema and no tenderness.       Walks with cane , antalgic gaitf from chronic back pain Lymphadenopathy:    He has no cervical adenopathy.  Neurological: He is alert and oriented to person, place, and time. He has normal reflexes. No cranial nerve deficit. Coordination normal.  Skin: Skin is warm and dry. No rash noted. He is not diaphoretic. No erythema. No pallor.  Psychiatric: FLAT AFFECT     Assessment:       ICD-9-CM ICD-10-CM   1. Chronic obstructive pulmonary disease, unspecified COPD type ([place]) 496 J44.9   2. Pulmonary nodule, right 32mm Aug 2014 793.11 R91.1   3. Smoking 305.1 Z72.0        Plan:      #Copd  - COPD appears stable. Continue inhaler therapy - Refer pulmonary rehabilitation at [facility] - We will send an order for trying to nocturnal oxygen through a face mask instead of nasal cannula  #Lung nodule -This is stable between August 2014 and January 2017 - No active follow-up of the nodule -- Will discuss lung cancer screening at follow-up  #Smoking - Glad you quit one month ago   #Follow-up  - 6-9 months do full pulmonary function test - Return to see me after full pulmonary  function test  Dr. Brand Males, M.D., [facility].C.P Pulmonary and Critical Care Medicine Staff Physician [facility] Pager: [phone], If no answer or between  15:00h - 7:00h: call [phone]  10/29/2015 2:27 PM

## 2015-10-29 NOTE — Addendum Note (Signed)
Addended by: Collier Salina on: 10/29/2015 05:28 PM   Modules accepted: Orders

## 2015-10-29 NOTE — Addendum Note (Signed)
Addended by: Collier Salina on: 10/29/2015 02:31 PM   Modules accepted: Orders

## 2015-10-29 NOTE — Patient Instructions (Signed)
ICD-9-CM ICD-10-CM   1. Chronic obstructive pulmonary disease, unspecified COPD type ([place]) 496 J44.9   2. Pulmonary nodule, right 53mm Aug 2014 793.11 R91.1   3. Smoking 305.1 Z72.0     #Copd  - COPD appears stable. Continue inhaler therapy - Refer pulmonary rehabilitation at [facility] - We will send an order for trying to nocturnal oxygen through a face mask instead of nasal cannula  #Lung nodule -This is stable between August 2014 and January 2017 - No active follow-up of the nodule -- Will discuss lung cancer screening at follow-up  #Smoking - Glad you quit one month ago   #Follow-up  - 6-9 months do full pulmonary function test - Return to see me after full pulmonary function test

## 2015-11-01 DIAGNOSIS — J441 Chronic obstructive pulmonary disease with (acute) exacerbation: Secondary | ICD-10-CM | POA: Diagnosis not present

## 2015-11-06 ENCOUNTER — Telehealth: Payer: Self-pay | Admitting: Internal Medicine

## 2015-11-06 DIAGNOSIS — J441 Chronic obstructive pulmonary disease with (acute) exacerbation: Secondary | ICD-10-CM | POA: Diagnosis not present

## 2015-11-06 DIAGNOSIS — J439 Emphysema, unspecified: Secondary | ICD-10-CM

## 2015-11-06 NOTE — Telephone Encounter (Signed)
Called spoke with Janett Billow at [facility]. She states that per the manufacturer and [employer] a titration is needed due to the type of mask. She states the new order needs to state "please titrate nocturnal mask". I explained to her that I would send an order in. She voiced understanding and had no further questions. Nothing further needed. Order placed.

## 2015-11-06 NOTE — Telephone Encounter (Signed)
Arbie Cookey [phone] [employer]

## 2015-11-06 NOTE — Telephone Encounter (Signed)
Received fax from [employer] stating the recent referral order for pt's O2 mask for nocturnal O2 use "needs to read for RT to titrate mask". Unsure if new order needs to be placed. Called [employer] and spoke to rep and she states she will have RT return my call to see if something else is needed for pt to receive his mask. Will await call.

## 2015-11-08 ENCOUNTER — Ambulatory Visit (HOSPITAL_BASED_OUTPATIENT_CLINIC_OR_DEPARTMENT_OTHER): Payer: PPO

## 2015-11-08 VITALS — BP 119/80 | HR 77 | Temp 98.0°F | Resp 16

## 2015-11-08 DIAGNOSIS — D509 Iron deficiency anemia, unspecified: Secondary | ICD-10-CM

## 2015-11-08 DIAGNOSIS — E291 Testicular hypofunction: Secondary | ICD-10-CM

## 2015-11-08 MED ORDER — TESTOSTERONE CYPIONATE 200 MG/ML IM SOLN
300.0000 mg | INTRAMUSCULAR | Status: DC
  Administered 2015-11-08: 300 mg via INTRAMUSCULAR

## 2015-11-08 MED ORDER — TESTOSTERONE CYPIONATE 200 MG/ML IM SOLN
INTRAMUSCULAR | Status: AC
  Filled 2015-11-08: qty 2

## 2015-11-08 NOTE — Patient Instructions (Signed)

## 2015-11-18 DIAGNOSIS — H40013 Open angle with borderline findings, low risk, bilateral: Secondary | ICD-10-CM | POA: Diagnosis not present

## 2015-11-22 ENCOUNTER — Ambulatory Visit (HOSPITAL_BASED_OUTPATIENT_CLINIC_OR_DEPARTMENT_OTHER): Payer: PPO

## 2015-11-22 VITALS — BP 134/92 | HR 105 | Temp 98.2°F | Resp 16

## 2015-11-22 DIAGNOSIS — E291 Testicular hypofunction: Secondary | ICD-10-CM | POA: Diagnosis not present

## 2015-11-22 DIAGNOSIS — D509 Iron deficiency anemia, unspecified: Secondary | ICD-10-CM

## 2015-11-22 MED ORDER — TESTOSTERONE CYPIONATE 200 MG/ML IM SOLN
INTRAMUSCULAR | Status: AC
  Filled 2015-11-22: qty 2

## 2015-11-22 MED ORDER — TESTOSTERONE CYPIONATE 200 MG/ML IM SOLN
300.0000 mg | INTRAMUSCULAR | Status: DC
  Administered 2015-11-22: 300 mg via INTRAMUSCULAR

## 2015-11-22 NOTE — Patient Instructions (Signed)

## 2015-11-26 ENCOUNTER — Telehealth: Payer: Self-pay | Admitting: Internal Medicine

## 2015-11-26 NOTE — Telephone Encounter (Signed)
lmtcb X1 for James Zuniga at [facility]. Need order form to be refaxed to office.

## 2015-11-27 NOTE — Telephone Encounter (Signed)
Form has been faxed, is in MR's folder up front to be distributed later today.  Will forward to James Zuniga's box for follow-up

## 2015-11-27 NOTE — Telephone Encounter (Signed)
Jasmine cb, informed her per Ashley's phone note to refax form to [phone], she is going to refax the form now and I will place this in MR box once received. [phone]

## 2015-11-28 NOTE — Telephone Encounter (Signed)
LVM for Jasmine to return call. Form has been signed and faxed back.

## 2015-11-28 NOTE — Telephone Encounter (Signed)
Jasmine called back regarding form. Patient has an appointment on 12/03/15 and they are needing the form back. Please call [phone] with update -prm

## 2015-11-29 ENCOUNTER — Telehealth: Payer: Self-pay | Admitting: Internal Medicine

## 2015-11-29 NOTE — Telephone Encounter (Signed)
LMOM for [place] with Emphysema and Chronic Reps Failure  as alternative dx for pulm rehab

## 2015-11-29 NOTE — Telephone Encounter (Signed)
Spoke with [place]. She did not receive this form yesterday. This form has been refaxed to her. Nothing further was needed at this time.

## 2015-12-01 DIAGNOSIS — J441 Chronic obstructive pulmonary disease with (acute) exacerbation: Secondary | ICD-10-CM | POA: Diagnosis not present

## 2015-12-03 DIAGNOSIS — J9610 Chronic respiratory failure, unspecified whether with hypoxia or hypercapnia: Secondary | ICD-10-CM | POA: Diagnosis not present

## 2015-12-06 ENCOUNTER — Encounter: Payer: Self-pay | Admitting: Hematology & Oncology

## 2015-12-06 ENCOUNTER — Telehealth: Payer: Self-pay | Admitting: Internal Medicine

## 2015-12-06 ENCOUNTER — Ambulatory Visit (HOSPITAL_BASED_OUTPATIENT_CLINIC_OR_DEPARTMENT_OTHER): Payer: PPO

## 2015-12-06 ENCOUNTER — Ambulatory Visit (HOSPITAL_BASED_OUTPATIENT_CLINIC_OR_DEPARTMENT_OTHER): Payer: PPO | Admitting: Hematology & Oncology

## 2015-12-06 ENCOUNTER — Other Ambulatory Visit (HOSPITAL_BASED_OUTPATIENT_CLINIC_OR_DEPARTMENT_OTHER): Payer: PPO

## 2015-12-06 VITALS — BP 127/84 | HR 74 | Temp 97.8°F | Resp 14 | Ht 66.0 in | Wt 207.0 lb

## 2015-12-06 DIAGNOSIS — E291 Testicular hypofunction: Secondary | ICD-10-CM

## 2015-12-06 DIAGNOSIS — D509 Iron deficiency anemia, unspecified: Secondary | ICD-10-CM | POA: Diagnosis not present

## 2015-12-06 DIAGNOSIS — K769 Liver disease, unspecified: Secondary | ICD-10-CM | POA: Diagnosis not present

## 2015-12-06 DIAGNOSIS — N401 Enlarged prostate with lower urinary tract symptoms: Secondary | ICD-10-CM

## 2015-12-06 DIAGNOSIS — I5032 Chronic diastolic (congestive) heart failure: Secondary | ICD-10-CM

## 2015-12-06 DIAGNOSIS — E349 Endocrine disorder, unspecified: Secondary | ICD-10-CM

## 2015-12-06 DIAGNOSIS — J441 Chronic obstructive pulmonary disease with (acute) exacerbation: Secondary | ICD-10-CM | POA: Diagnosis not present

## 2015-12-06 DIAGNOSIS — N138 Other obstructive and reflux uropathy: Secondary | ICD-10-CM

## 2015-12-06 LAB — IRON AND TIBC
%SAT: 22 % (ref 20–55)
IRON: 59 ug/dL (ref 42–163)
TIBC: 270 ug/dL (ref 202–409)
UIBC: 211 ug/dL (ref 117–376)

## 2015-12-06 LAB — CBC WITH DIFFERENTIAL (CANCER CENTER ONLY)
BASO#: 0 10*3/uL (ref 0.0–0.2)
BASO%: 0.5 % (ref 0.0–2.0)
EOS%: 3.4 % (ref 0.0–7.0)
Eosinophils Absolute: 0.3 10*3/uL (ref 0.0–0.5)
HEMATOCRIT: 47.5 % (ref 38.7–49.9)
HEMOGLOBIN: 15.9 g/dL (ref 13.0–17.1)
LYMPH#: 2.1 10*3/uL (ref 0.9–3.3)
LYMPH%: 25.5 % (ref 14.0–48.0)
MCH: 30.5 pg (ref 28.0–33.4)
MCHC: 33.5 g/dL (ref 32.0–35.9)
MCV: 91 fL (ref 82–98)
MONO#: 0.5 10*3/uL (ref 0.1–0.9)
MONO%: 5.7 % (ref 0.0–13.0)
NEUT#: 5.3 10*3/uL (ref 1.5–6.5)
NEUT%: 64.9 % (ref 40.0–80.0)
Platelets: 283 10*3/uL (ref 145–400)
RBC: 5.21 10*6/uL (ref 4.20–5.70)
RDW: 14.3 % (ref 11.1–15.7)
WBC: 8.2 10*3/uL (ref 4.0–10.0)

## 2015-12-06 LAB — COMPREHENSIVE METABOLIC PANEL
ALBUMIN: 3.7 g/dL (ref 3.5–5.0)
ALK PHOS: 99 U/L (ref 40–150)
ALT: 31 U/L (ref 0–55)
ANION GAP: 11 meq/L (ref 3–11)
AST: 24 U/L (ref 5–34)
BUN: 9.6 mg/dL (ref 7.0–26.0)
CALCIUM: 9.9 mg/dL (ref 8.4–10.4)
CHLORIDE: 103 meq/L (ref 98–109)
CO2: 28 mEq/L (ref 22–29)
Creatinine: 1.2 mg/dL (ref 0.7–1.3)
EGFR: 73 mL/min/{1.73_m2} — ABNORMAL LOW (ref >90)
Glucose: 143 mg/dl — ABNORMAL HIGH (ref 70–140)
POTASSIUM: 3.1 meq/L — AB (ref 3.5–5.1)
Sodium: 142 mEq/L (ref 136–145)
Total Bilirubin: 0.53 mg/dL (ref 0.20–1.20)
Total Protein: 7.7 g/dL (ref 6.4–8.3)

## 2015-12-06 LAB — FERRITIN: FERRITIN: 391 ng/mL — AB (ref 22–316)

## 2015-12-06 MED ORDER — TAMSULOSIN HCL 0.4 MG PO CAPS: 0.4000 mg | ORAL_CAPSULE | Freq: Every day | ORAL | Status: AC

## 2015-12-06 MED ORDER — TESTOSTERONE CYPIONATE 200 MG/ML IM SOLN
300.0000 mg | INTRAMUSCULAR | Status: DC
  Administered 2015-12-06: 300 mg via INTRAMUSCULAR

## 2015-12-06 MED ORDER — TESTOSTERONE CYPIONATE 200 MG/ML IM SOLN
INTRAMUSCULAR | Status: AC
  Filled 2015-12-06: qty 2

## 2015-12-06 NOTE — Patient Instructions (Signed)

## 2015-12-06 NOTE — Telephone Encounter (Signed)
Got letter from [facility] rehab dept. They want full PFT before they will accept him into rehab for dx of copd. Please get him a PFT ASAP in any location. Let me know date so I can keep track and do repeat order at that time. Pls send msg back once you have PFT appt for him

## 2015-12-06 NOTE — Progress Notes (Signed)
Hematology and Oncology Follow Up Visit  James Zuniga [id number] 1956/06/18 60 y.o. 12/06/2015   Principle Diagnosis:   Iron deficiency anemia  hypotestosteronemia  Chronic liver lesions  Current Therapy:    IV iron as indicated  Depo- testosterone 319m IM Q2 weeks     Interim History:  James Zuniga back for followup. He actually looks fairly good. He is doing pulmonary physical therapy. This is helping him. He has a little bit more stamina.  His knees do bother him. He still not yet seen an orthopedic surgeon.  He is still smoking. He says he smokes 4-5 cigarettes a day. This really is not all that bad. I told him that it will take quite a lot of get off cigarettes. However, as long as he is staying diligent, he will get off.  He's had no problems with bleeding.  There's not been any issues with nausea or vomiting.  He gets his testosterone every couple weeks. His last testosterone level was 497.  He is complaining of slight urinary hesitancy. He does seem a little longer to urinate. This will have to be watched.  He says he is taking medicine. He says he is taking his vitamin D..  His last iron studies done back in March showed a ferritin of 579 with an iron saturation of 33%. His total iron was 85.    Medications:  Current outpatient prescriptions:  .  albuterol (PROVENTIL HFA;VENTOLIN HFA) 108 (90 Base) MCG/ACT inhaler, Inhale 1-2 puffs into the lungs every 6 (six) hours as needed for wheezing., Disp: 1 Inhaler, Rfl: 0 .  allopurinol (ZYLOPRIM) 300 MG tablet, Take 300 mg by mouth daily., Disp: , Rfl:  .  amLODipine (NORVASC) 10 MG tablet, 10 mg daily., Disp: , Rfl:  .  aspirin 81 MG EC tablet, Take 81 mg by mouth daily. Swallow whole., Disp: , Rfl:  .  atorvastatin (LIPITOR) 10 MG tablet, TAKE 1 TABLET BY MOUTH DAILY, Disp: , Rfl:  .  B-D ULTRA-FINE 33 LANCETS MISC, by Does not apply route., Disp: , Rfl:  .  Blood Glucose Monitoring Suppl (FIFTY50 GLUCOSE METER  2.0) w/Device KIT, Use as instructed, Disp: , Rfl:  .  BREO ELLIPTA 100-25 MCG/INH AEPB, INL 1 PUFF PO D, Disp: 1 each, Rfl: 5 .  Cholecalciferol (VITAMIN D3) 1000 UNITS CAPS, Take by mouth., Disp: , Rfl:  .  diclofenac sodium (VOLTAREN) 1 % GEL, Apply topically., Disp: , Rfl:  .  DULoxetine (CYMBALTA) 60 MG capsule, Take one daily, Disp: , Rfl:  .  furosemide (LASIX) 20 MG tablet, Take 1 tablet by mouth 2 (two) times daily., Disp: , Rfl:  .  gabapentin (NEURONTIN) 300 MG capsule, TAKE 1 CAPSULE BY MOUTH THREE TIMES DAILY, Disp: , Rfl:  .  ibuprofen (ADVIL,MOTRIN) 600 MG tablet, 600 mg., Disp: , Rfl: 2 .  losartan (COZAAR) 50 MG tablet, Take 50 mg by mouth daily. , Disp: , Rfl:  .  metoprolol tartrate (LOPRESSOR) 25 MG tablet, Take 25 mg by mouth daily. , Disp: , Rfl:  .  omeprazole (PRILOSEC) 20 MG capsule, Take 20 mg by mouth daily. , Disp: , Rfl: 0 .  ONE TOUCH ULTRA TEST test strip, 2 (two) times daily. for testing, Disp: , Rfl: 11 .  potassium chloride SA (K-DUR,KLOR-CON) 20 MEQ tablet, Take 1 tablet (20 mEq total) by mouth 2 (two) times daily., Disp: 30 tablet, Rfl: 3 .  tiotropium (SPIRIVA) 18 MCG inhalation capsule, Place 1 capsule (  18 mcg total) into inhaler and inhale daily., Disp: 30 capsule, Rfl: 6 .  tiZANidine (ZANAFLEX) 4 MG tablet, 4 mg 3 (three) times daily as needed., Disp: , Rfl: 0 .  traMADol (ULTRAM) 50 MG tablet, Take 50 mg by mouth daily as needed for moderate pain. , Disp: , Rfl:  .  zolpidem (AMBIEN) 10 MG tablet, 10 mg at bedtime as needed., Disp: , Rfl:   Allergies: No Known Allergies  Past Medical History, Surgical history, Social history, and Family History were reviewed and updated.  Review of Systems: As above  Physical Exam:  height is '5\' 6"'  (1.676 m) and weight is 207 lb (93.895 kg). His oral temperature is 97.8 F (36.6 C). His blood pressure is 127/84 and his pulse is 74. His respiration is 14.   Well-developed and well-nourished African American  gentleman. Head and neck exam shows no ocular or oral lesions. He does have a bit of firmness and slight tenderness in about a centimeter area just to the left of the angle of his mouth. This is where he has this abscess. He has no adenopathy. Lungs are clear. Cardiac exam is regular rate and rhythm. There are no murmurs, rubs or bruits. Abdomen is soft. Has good bowel sounds. There is no fluid wave. There is no palpable liver or spleen tip. Back exam shows no tenderness over the spine ribs or hips. Extremities shows no clubbing, cyanosis or edema. He has a knee brace on the left knee. He has some slight swelling in the left leg. Skin exam shows no rashes, ecchymoses or petechia. Neurological exam is nonfocal.  Lab Results  Component Value Date   WBC 8.2 12/06/2015   HGB 15.9 12/06/2015   HCT 47.5 12/06/2015   MCV 91 12/06/2015   PLT 283 12/06/2015     Chemistry      Component Value Date/Time   NA 141 10/11/2015 0815   NA 137 09/25/2015 1945   NA 134 12/21/2014 0857   K 3.2* 10/11/2015 0815   K 2.8* 09/25/2015 1945   K 3.3 12/21/2014 0857   CL 101 09/25/2015 1945   CL 97* 12/21/2014 0857   CO2 27 10/11/2015 0815   CO2 26 09/25/2015 1945   CO2 30 12/21/2014 0857   BUN 11.1 10/11/2015 0815   BUN 6 09/25/2015 1945   BUN 8 12/21/2014 0857   CREATININE 1.1 10/11/2015 0815   CREATININE 1.00 09/25/2015 1945   CREATININE 1.3* 12/21/2014 0857      Component Value Date/Time   CALCIUM 9.0 10/11/2015 0815   CALCIUM 8.7* 09/25/2015 1945   CALCIUM 9.2 12/21/2014 0857   ALKPHOS 114 10/11/2015 0815   ALKPHOS 98* 12/21/2014 0857   ALKPHOS 102 11/09/2014 1343   AST 23 10/11/2015 0815   AST 28 12/21/2014 0857   AST 23 11/09/2014 1343   ALT 45 10/11/2015 0815   ALT 32 12/21/2014 0857   ALT 29 11/09/2014 1343   BILITOT 0.52 10/11/2015 0815   BILITOT 0.80 12/21/2014 0857   BILITOT 0.5 11/09/2014 1343         Impression and Plan: James Zuniga is 60 year old African-American male. He has  multifactorial anemia. We have taken care of this nicely. He gets IV iron on occasion although we have not had to give him any for many months.  We will go ahead and give him a testosterone shot today. I think this is helping him more than anything. The every 2 week dosing of testosterone I think is  working very nicely.   I'm so happy that he is doing better. He still has the knee issues. The left knee is causing problems for him.  We will plan to get back to see [place] in 3 more months. He comes every 2 weeks for his testosterone.  I will give him Flomax. If his urination becomes more of an issue, he will have to see urology.   Nile Riggs, MD 5/26/20178:56 AM

## 2015-12-07 LAB — TESTOSTERONE: Testosterone, Serum: 798 ng/dL (ref 348–1197)

## 2015-12-11 NOTE — Telephone Encounter (Signed)
Spoke with pt, states he's already started pulmonary rehab, but is ok with doing PFT.  States this cannot be done in next 2 weeks.  Per 5/19 phone note pt's dx for pulm rehab was changed to emphysema and chronic respiratory failure.  MR does pt still need PFT if he's already in pulm rehab and dx code has been changed?  Thanks!

## 2015-12-11 NOTE — Telephone Encounter (Signed)
If dx code changd to emphysema medicare typically does not demand pft to start and pay rehab center. So check with rehab ctr if they still want the pft. If not, no need

## 2015-12-11 NOTE — Telephone Encounter (Signed)
Pt says he has already gone to rehab 3 times but will wait to hear from the nurse about getting a pft done

## 2015-12-11 NOTE — Telephone Encounter (Signed)
Pt states he was told that he would have to start paying out of pocket for Pulm Rehab if we cannot get Medicare and diagnosis codes straight.  Pt states that he has an appt 12/12/15 and is not going to go because he doesn't want to get charged for it and have to pay out of pocket.  Will hold in triage to call Pulm Rehab first thing tomorrow (12/12/15) to try and straighten this out so that maybe the patient can still go for his appt tomorrow.

## 2015-12-12 NOTE — Telephone Encounter (Signed)
Spoke with Ashley @ [facility], states that this has been changed already per phone conversation with Magda Paganini. I do not see anything documented in the chart stating that this was taken care of already. Verified the information again with Jasmine to change the Dx Code to Emphysema. Jasmine states that there should be no further issues with coverage but that she will contact the patient to discuss insurance further before scheduling him to come in for a session. Nothing further needed.

## 2015-12-12 NOTE — Telephone Encounter (Signed)
Need to contact [place] states that the patient just needs a new order for Pulm Rehab and change dx code to EMPHYSEMA. Medicare will approve at that point.

## 2015-12-13 DIAGNOSIS — H40013 Open angle with borderline findings, low risk, bilateral: Secondary | ICD-10-CM | POA: Diagnosis not present

## 2015-12-20 ENCOUNTER — Ambulatory Visit (HOSPITAL_BASED_OUTPATIENT_CLINIC_OR_DEPARTMENT_OTHER): Payer: PPO

## 2015-12-20 VITALS — BP 138/88 | HR 83 | Temp 97.7°F | Resp 18

## 2015-12-20 DIAGNOSIS — D509 Iron deficiency anemia, unspecified: Secondary | ICD-10-CM

## 2015-12-20 DIAGNOSIS — E291 Testicular hypofunction: Secondary | ICD-10-CM | POA: Diagnosis not present

## 2015-12-20 MED ORDER — TESTOSTERONE CYPIONATE 200 MG/ML IM SOLN
300.0000 mg | INTRAMUSCULAR | Status: DC
Start: 2015-12-20 — End: 2015-12-20
  Administered 2015-12-20: 300 mg via INTRAMUSCULAR

## 2015-12-20 MED ORDER — TESTOSTERONE CYPIONATE 200 MG/ML IM SOLN
INTRAMUSCULAR | Status: AC
  Filled 2015-12-20: qty 2

## 2015-12-20 NOTE — Patient Instructions (Signed)

## 2016-01-01 DIAGNOSIS — J441 Chronic obstructive pulmonary disease with (acute) exacerbation: Secondary | ICD-10-CM | POA: Diagnosis not present

## 2016-01-03 ENCOUNTER — Ambulatory Visit (HOSPITAL_BASED_OUTPATIENT_CLINIC_OR_DEPARTMENT_OTHER): Payer: PPO

## 2016-01-03 VITALS — BP 138/89 | HR 70 | Temp 98.0°F | Resp 18

## 2016-01-03 DIAGNOSIS — E291 Testicular hypofunction: Secondary | ICD-10-CM | POA: Diagnosis not present

## 2016-01-03 DIAGNOSIS — D509 Iron deficiency anemia, unspecified: Secondary | ICD-10-CM

## 2016-01-03 MED ORDER — TESTOSTERONE CYPIONATE 200 MG/ML IM SOLN
300.0000 mg | INTRAMUSCULAR | Status: DC
Start: 2016-01-03 — End: 2016-01-03
  Administered 2016-01-03: 300 mg via INTRAMUSCULAR

## 2016-01-03 MED ORDER — TESTOSTERONE CYPIONATE 200 MG/ML IM SOLN
INTRAMUSCULAR | Status: AC
Start: 2016-01-03 — End: 2016-01-03
  Filled 2016-01-03: qty 2

## 2016-01-03 NOTE — Patient Instructions (Signed)

## 2016-01-06 DIAGNOSIS — J441 Chronic obstructive pulmonary disease with (acute) exacerbation: Secondary | ICD-10-CM | POA: Diagnosis not present

## 2016-01-13 DIAGNOSIS — J9610 Chronic respiratory failure, unspecified whether with hypoxia or hypercapnia: Secondary | ICD-10-CM | POA: Diagnosis not present

## 2016-01-17 ENCOUNTER — Ambulatory Visit (HOSPITAL_BASED_OUTPATIENT_CLINIC_OR_DEPARTMENT_OTHER): Payer: PPO

## 2016-01-17 VITALS — BP 138/94 | HR 91 | Temp 98.1°F | Resp 20

## 2016-01-17 DIAGNOSIS — D509 Iron deficiency anemia, unspecified: Secondary | ICD-10-CM

## 2016-01-17 DIAGNOSIS — E291 Testicular hypofunction: Secondary | ICD-10-CM

## 2016-01-17 MED ORDER — TESTOSTERONE CYPIONATE 200 MG/ML IM SOLN
300.0000 mg | INTRAMUSCULAR | Status: DC
  Administered 2016-01-17: 300 mg via INTRAMUSCULAR

## 2016-01-17 MED ORDER — TESTOSTERONE CYPIONATE 200 MG/ML IM SOLN
INTRAMUSCULAR | Status: AC
  Filled 2016-01-17: qty 2

## 2016-01-17 NOTE — Patient Instructions (Signed)

## 2016-01-31 ENCOUNTER — Ambulatory Visit (HOSPITAL_BASED_OUTPATIENT_CLINIC_OR_DEPARTMENT_OTHER): Payer: PPO

## 2016-01-31 VITALS — BP 118/86 | HR 79 | Temp 98.2°F | Resp 16

## 2016-01-31 DIAGNOSIS — D509 Iron deficiency anemia, unspecified: Secondary | ICD-10-CM

## 2016-01-31 DIAGNOSIS — E291 Testicular hypofunction: Secondary | ICD-10-CM | POA: Diagnosis not present

## 2016-01-31 DIAGNOSIS — J441 Chronic obstructive pulmonary disease with (acute) exacerbation: Secondary | ICD-10-CM | POA: Diagnosis not present

## 2016-01-31 MED ORDER — TESTOSTERONE CYPIONATE 200 MG/ML IM SOLN
300.0000 mg | INTRAMUSCULAR | Status: DC
  Administered 2016-01-31: 300 mg via INTRAMUSCULAR

## 2016-01-31 NOTE — Patient Instructions (Signed)

## 2016-02-05 DIAGNOSIS — J441 Chronic obstructive pulmonary disease with (acute) exacerbation: Secondary | ICD-10-CM | POA: Diagnosis not present

## 2016-02-14 ENCOUNTER — Ambulatory Visit (HOSPITAL_BASED_OUTPATIENT_CLINIC_OR_DEPARTMENT_OTHER): Payer: PPO

## 2016-02-14 VITALS — BP 121/84 | HR 88 | Temp 98.1°F | Resp 18

## 2016-02-14 DIAGNOSIS — E291 Testicular hypofunction: Secondary | ICD-10-CM | POA: Diagnosis not present

## 2016-02-14 DIAGNOSIS — D509 Iron deficiency anemia, unspecified: Secondary | ICD-10-CM

## 2016-02-14 MED ORDER — TESTOSTERONE CYPIONATE 200 MG/ML IM SOLN
300.0000 mg | INTRAMUSCULAR | Status: DC
  Administered 2016-02-14: 300 mg via INTRAMUSCULAR

## 2016-02-14 MED ORDER — TESTOSTERONE CYPIONATE 200 MG/ML IM SOLN
INTRAMUSCULAR | Status: AC
  Filled 2016-02-14: qty 2

## 2016-02-14 NOTE — Patient Instructions (Signed)

## 2016-02-28 ENCOUNTER — Ambulatory Visit (HOSPITAL_BASED_OUTPATIENT_CLINIC_OR_DEPARTMENT_OTHER): Payer: PPO

## 2016-02-28 VITALS — BP 115/81 | HR 88 | Temp 98.0°F | Resp 18

## 2016-02-28 DIAGNOSIS — E291 Testicular hypofunction: Secondary | ICD-10-CM

## 2016-02-28 DIAGNOSIS — D509 Iron deficiency anemia, unspecified: Secondary | ICD-10-CM

## 2016-02-28 MED ORDER — TESTOSTERONE CYPIONATE 200 MG/ML IM SOLN
300.0000 mg | INTRAMUSCULAR | Status: DC
  Administered 2016-02-28: 300 mg via INTRAMUSCULAR

## 2016-02-28 MED ORDER — TESTOSTERONE CYPIONATE 200 MG/ML IM SOLN
INTRAMUSCULAR | Status: AC
  Filled 2016-02-28: qty 2

## 2016-02-28 NOTE — Patient Instructions (Signed)

## 2016-03-02 DIAGNOSIS — J441 Chronic obstructive pulmonary disease with (acute) exacerbation: Secondary | ICD-10-CM | POA: Diagnosis not present

## 2016-03-07 DIAGNOSIS — J441 Chronic obstructive pulmonary disease with (acute) exacerbation: Secondary | ICD-10-CM | POA: Diagnosis not present

## 2016-03-13 ENCOUNTER — Other Ambulatory Visit (HOSPITAL_BASED_OUTPATIENT_CLINIC_OR_DEPARTMENT_OTHER): Payer: PPO

## 2016-03-13 ENCOUNTER — Ambulatory Visit (HOSPITAL_BASED_OUTPATIENT_CLINIC_OR_DEPARTMENT_OTHER): Payer: PPO | Admitting: Family

## 2016-03-13 ENCOUNTER — Ambulatory Visit (HOSPITAL_BASED_OUTPATIENT_CLINIC_OR_DEPARTMENT_OTHER): Payer: PPO

## 2016-03-13 ENCOUNTER — Encounter: Payer: Self-pay | Admitting: Family

## 2016-03-13 VITALS — BP 124/86 | HR 67 | Temp 98.0°F | Resp 16 | Wt 203.0 lb

## 2016-03-13 DIAGNOSIS — E291 Testicular hypofunction: Secondary | ICD-10-CM

## 2016-03-13 DIAGNOSIS — I5032 Chronic diastolic (congestive) heart failure: Secondary | ICD-10-CM | POA: Diagnosis not present

## 2016-03-13 DIAGNOSIS — E349 Endocrine disorder, unspecified: Secondary | ICD-10-CM

## 2016-03-13 DIAGNOSIS — D509 Iron deficiency anemia, unspecified: Secondary | ICD-10-CM

## 2016-03-13 LAB — CMP (CANCER CENTER ONLY)
ALBUMIN: 3.7 g/dL (ref 3.3–5.5)
ALT: 21 U/L (ref 10–47)
AST: 20 U/L (ref 11–38)
Alkaline Phosphatase: 78 U/L (ref 26–84)
BILIRUBIN TOTAL: 0.9 mg/dL (ref 0.20–1.60)
BUN, Bld: 9 mg/dL (ref 7–22)
CO2: 31 mEq/L (ref 18–33)
CREATININE: 1.5 mg/dL — AB (ref 0.6–1.2)
Calcium: 9.3 mg/dL (ref 8.0–10.3)
Chloride: 97 mEq/L — ABNORMAL LOW (ref 98–108)
Glucose, Bld: 131 mg/dL — ABNORMAL HIGH (ref 73–118)
Potassium: 2.5 mEq/L — CL (ref 3.3–4.7)
SODIUM: 136 meq/L (ref 128–145)
TOTAL PROTEIN: 7.7 g/dL (ref 6.4–8.1)

## 2016-03-13 LAB — IRON AND TIBC
%SAT: 37 % (ref 20–55)
IRON: 109 ug/dL (ref 42–163)
TIBC: 294 ug/dL (ref 202–409)
UIBC: 185 ug/dL (ref 117–376)

## 2016-03-13 LAB — CBC WITH DIFFERENTIAL (CANCER CENTER ONLY)
BASO#: 0 10*3/uL (ref 0.0–0.2)
BASO%: 0.5 % (ref 0.0–2.0)
EOS%: 2.9 % (ref 0.0–7.0)
Eosinophils Absolute: 0.2 10*3/uL (ref 0.0–0.5)
HEMATOCRIT: 46.6 % (ref 38.7–49.9)
HEMOGLOBIN: 15.8 g/dL (ref 13.0–17.1)
LYMPH#: 1.7 10*3/uL (ref 0.9–3.3)
LYMPH%: 26 % (ref 14.0–48.0)
MCH: 30.9 pg (ref 28.0–33.4)
MCHC: 33.9 g/dL (ref 32.0–35.9)
MCV: 91 fL (ref 82–98)
MONO#: 0.3 10*3/uL (ref 0.1–0.9)
MONO%: 4.1 % (ref 0.0–13.0)
NEUT%: 66.5 % (ref 40.0–80.0)
NEUTROS ABS: 4.3 10*3/uL (ref 1.5–6.5)
Platelets: 295 10*3/uL (ref 145–400)
RBC: 5.11 10*6/uL (ref 4.20–5.70)
RDW: 16.4 % — ABNORMAL HIGH (ref 11.1–15.7)
WBC: 6.5 10*3/uL (ref 4.0–10.0)

## 2016-03-13 LAB — FERRITIN: FERRITIN: 301 ng/mL (ref 22–316)

## 2016-03-13 MED ORDER — TESTOSTERONE CYPIONATE 200 MG/ML IM SOLN
300.0000 mg | INTRAMUSCULAR | Status: DC
  Administered 2016-03-13: 300 mg via INTRAMUSCULAR

## 2016-03-13 MED ORDER — TESTOSTERONE CYPIONATE 200 MG/ML IM SOLN
INTRAMUSCULAR | Status: AC
  Filled 2016-03-13: qty 2

## 2016-03-13 NOTE — Progress Notes (Signed)
Hematology and Oncology Follow Up Visit  ZAVIER Zuniga [id number] 1955-12-20 60 y.o. 03/13/2016   Principle Diagnosis:  Iron deficiency anemia Hypotestosteronemia Chronic liver lesions  Current Therapy:   IV iron as indicated - last received in December 2016 Depotestosterone 400 mg IM Q2 weeks    Interim History: James Zuniga is here today for a follow-up. He is doing fairly well. He has emphysema and tried doing pulmonary rehab but states that he had to quit because is was "too much" for him. He is still smoking a little less than 1 ppd. He uses a nebulizer and home O2 as needed. He does wear 2 L [place] supplemental O2 at night.  No fever, chills, n/v, cough, rash, headache, dizziness, blurred vision, chest pain, palpitations, abdominal pain or changes in bowel or bladder habits.  He has occasional swelling in his feet and ankles and is on Lasix BID. His potassium is low at 2.5 so he will take KDUR 40 meq BID for the next 3 days. No tenderness, numbness or tingling in his extremities.  He states that his blood sugars have been fairly well controlled averaging in the 130's. He is 131 today. His appetite is good and he is still supplementing with ensure. He is staying well hydrated. His weight is stable.   Medications:    Medication List       Accurate as of 03/13/16  8:43 AM. Always use your most recent med list.          albuterol 108 (90 Base) MCG/ACT inhaler Commonly known as:  PROVENTIL HFA;VENTOLIN HFA Inhale 1-2 puffs into the lungs every 6 (six) hours as needed for wheezing.   allopurinol 300 MG tablet Commonly known as:  ZYLOPRIM Take 300 mg by mouth daily.   amLODipine 10 MG tablet Commonly known as:  NORVASC 10 mg daily.   aspirin 81 MG EC tablet Take 81 mg by mouth daily. Swallow whole.   atorvastatin 10 MG tablet Commonly known as:  LIPITOR TAKE 1 TABLET BY MOUTH DAILY   B-D ULTRA-FINE 33 LANCETS Misc by Does not apply route.   BREO ELLIPTA 100-25 MCG/INH  Aepb Generic drug:  fluticasone furoate-vilanterol INL 1 PUFF PO D   CYMBALTA 60 MG capsule Generic drug:  DULoxetine Take one daily   diclofenac sodium 1 % Gel Commonly known as:  VOLTAREN Apply topically.   FIFTY50 GLUCOSE METER 2.0 w/Device Kit Use as instructed   furosemide 20 MG tablet Commonly known as:  LASIX Take 1 tablet by mouth 2 (two) times daily.   gabapentin 300 MG capsule Commonly known as:  NEURONTIN TAKE 1 CAPSULE BY MOUTH THREE TIMES DAILY   ibuprofen 600 MG tablet Commonly known as:  ADVIL,MOTRIN 600 mg.   losartan 50 MG tablet Commonly known as:  COZAAR Take 50 mg by mouth daily.   metoprolol tartrate 25 MG tablet Commonly known as:  LOPRESSOR Take 25 mg by mouth daily.   omeprazole 20 MG capsule Commonly known as:  PRILOSEC Take 20 mg by mouth daily.   ONE TOUCH ULTRA TEST test strip Generic drug:  glucose blood 2 (two) times daily. for testing   potassium chloride SA 20 MEQ tablet Commonly known as:  K-DUR,KLOR-CON Take 1 tablet (20 mEq total) by mouth 2 (two) times daily.   tamsulosin 0.4 MG Caps capsule Commonly known as:  FLOMAX Take 1 capsule (0.4 mg total) by mouth daily after breakfast.   tiotropium 18 MCG inhalation capsule Commonly known as:  SPIRIVA  Place 1 capsule (18 mcg total) into inhaler and inhale daily.   tiZANidine 4 MG tablet Commonly known as:  ZANAFLEX 4 mg 3 (three) times daily as needed.   traMADol 50 MG tablet Commonly known as:  ULTRAM Take 50 mg by mouth daily as needed for moderate pain.   Vitamin D3 1000 units Caps Take by mouth.   zolpidem 10 MG tablet Commonly known as:  AMBIEN 10 mg at bedtime as needed.       Allergies: No Known Allergies  Past Medical History, Surgical history, Social history, and Family History were reviewed and updated.  Review of Systems: All other 10 point review of systems is negative.   Physical Exam:  weight is 203 lb (92.1 kg). His oral temperature is 98 F  (36.7 C). His blood pressure is 124/86 and his pulse is 67. His respiration is 16.   Wt Readings from Last 3 Encounters:  03/13/16 203 lb (92.1 kg)  12/06/15 207 lb (93.9 kg)  10/29/15 205 lb (93 kg)    Ocular: Sclerae unicteric, pupils equal, round and reactive to light Ear-nose-throat: Oropharynx clear, dentition fair Lymphatic: No cervical supraclavicular or axillary adenopathy Lungs no rales or rhonchi, good excursion bilaterally Heart regular rate and rhythm, no murmur appreciated Abd soft, nontender, positive bowel sounds no liver or spleen tip palpated on exam, no fluid wave MSK no focal spinal tenderness, no joint edema Neuro: non-focal, well-oriented, appropriate affect Breast: Deferred   Lab Results  Component Value Date   WBC 6.5 03/13/2016   HGB 15.8 03/13/2016   HCT 46.6 03/13/2016   MCV 91 03/13/2016   PLT 295 03/13/2016   Lab Results  Component Value Date   FERRITIN 391 (H) 12/06/2015   IRON 59 12/06/2015   TIBC 270 12/06/2015   UIBC 211 12/06/2015   IRONPCTSAT 22 12/06/2015   Lab Results  Component Value Date   RETICCTPCT 1.2 06/20/2015   RBC 5.11 03/13/2016   RETICCTABS 63.0 06/20/2015   No results found for: KPAFRELGTCHN, LAMBDASER, KAPLAMBRATIO No results found for: IGGSERUM, IGA, IGMSERUM No results found for: Odetta Pink, SPEI   Chemistry      Component Value Date/Time   NA 142 12/06/2015 0817   K 3.1 (L) 12/06/2015 0817   CL 101 09/25/2015 1945   CL 97 (L) 12/21/2014 0857   CO2 28 12/06/2015 0817   BUN 9.6 12/06/2015 0817   CREATININE 1.2 12/06/2015 0817      Component Value Date/Time   CALCIUM 9.9 12/06/2015 0817   ALKPHOS 99 12/06/2015 0817   AST 24 12/06/2015 0817   ALT 31 12/06/2015 0817   BILITOT 0.53 12/06/2015 0817     Impression and Plan: James Zuniga is a pleasant 60 yo African American male with multifactorial anemia and hypotestosteronemia. He is doing fairly well but  continues to have pulmonary issues and is followed closely by pulmonology.  We will see what his iron studies show and bring him in next week for an infusion if needed.   He is on lasix and takes KDUR 20 meq BID. His potassium today is low at 2.5 so we will have him take 40 meq BID for the next 3 days.  He will continue to get his testosterone injections every 2 weeks.  We will see him back in 3 months for labs and follow-up.   He knows to call here with any questions or concerns. We can certainly see him sooner if need  be.   [name], NP 9/1/20178:43 AM

## 2016-03-13 NOTE — Patient Instructions (Signed)

## 2016-03-18 ENCOUNTER — Other Ambulatory Visit: Payer: Self-pay | Admitting: Family

## 2016-03-20 LAB — TESTOSTERONE: Testosterone, Serum: 690 ng/dL (ref 264–916)

## 2016-03-27 ENCOUNTER — Ambulatory Visit (HOSPITAL_BASED_OUTPATIENT_CLINIC_OR_DEPARTMENT_OTHER): Payer: PPO

## 2016-03-27 DIAGNOSIS — E291 Testicular hypofunction: Secondary | ICD-10-CM

## 2016-03-27 DIAGNOSIS — E349 Endocrine disorder, unspecified: Secondary | ICD-10-CM

## 2016-03-27 MED ORDER — TESTOSTERONE CYPIONATE 200 MG/ML IM SOLN
INTRAMUSCULAR | Status: AC
  Filled 2016-03-27: qty 2

## 2016-03-27 MED ORDER — TESTOSTERONE CYPIONATE 200 MG/ML IM SOLN
300.0000 mg | INTRAMUSCULAR | Status: DC
Start: 2016-03-27 — End: 2016-03-27
  Administered 2016-03-27: 300 mg via INTRAMUSCULAR

## 2016-03-27 NOTE — Patient Instructions (Signed)
Testosterone Testosterone is a hormone made by the male's testicles and by the adrenal glands, which are a pair of glands on top of the kidneys. Starting at puberty, testosterone stimulates the development of secondary sex characteristics. This includes a deeper voice, growth of muscles and body hair, and penis enlargement.  Females also produce testosterone in both the adrenal glands and ovaries. A male's body converts testosterone into estradiol, the main male sex hormone. An abnormal level of testosterone can cause health issues in both males and females. You may have this test if your health care provider suspects that an abnormal testosterone level is causing or contributing to other health problems. In males, symptoms of an abnormal testosterone level include:  Infertility.  Erectile dysfunction.  Delayed puberty or premature puberty. In females, symptoms of an abnormally high testosterone level include:  Infertility.  Polycystic ovarian syndrome (PCOS).  Developing masculine features (virilization). This test requires a blood sample taken from a vein in your arm or hand. The sample for this test is usually collected in the morning. The amount of testosterone in your blood is highest at that time. RESULTS It is your responsibility to obtain your test results. Ask the lab or department performing the test when and how you will get your results. Contact your health care provider to discuss any questions you have about your results.  The result of a blood test for testosterone will be given as a range of values. A testosterone level that is outside the normal range may indicate a health problem. Testosterone is measured in nanograms per deciliter (ng/dL). Range of Normal Values Ranges for normal values may vary among different labs and hospitals. You should always check with your health care provider after having lab work or other tests done to discuss whether your values are considered  within normal limits. Normal levels of total testosterone are as follows:  Male:  7 months to 61 years old: less than 30 ng/dL.  10-13 years old: less than 300 ng/dL.  14-15 years old: 170-540 ng/dL.  16-19 years old: 250-910 ng/dL.  61 years old and over: 280-1,080 ng/dL.  Male:  7 months to 61 years old: less than 30 ng/dL.  10-13 years old: less than 40 ng/dL.  14-15 years old: less than 60 ng/dL.  16-19 years old: less than 70 ng/dL.  61 years old and over: less than 70 ng/dL. Meaning of Results Outside Normal Value Ranges A testosterone level that is too low or too high can indicate a number of health problems. In males:  A high testosterone level can occur if you:  Have certain types of tumors.  Have an overactive thyroid gland (hyperthyroidism).  Use anabolic steroids.  Are starting puberty early (precocious puberty).  Have an inherited disorder that affects the adrenal glands (congenital adrenal hyperplasia).  A low testosterone level can occur if you:  Have certain genetic diseases.  Have had certain viral infections, such as mumps.  Have pituitary disease.  Have had an injury to the testicles.  Are an alcoholic. In females:  A high testosterone level can occur if you have:  Certain types of tumors.  An inherited disorder that affects certain cells in the adrenal glands (congenital adrenocortical hyperplasia).  PCOS.  A low testosterone level does not cause health problems. Discuss the results of your testosterone test with your health care provider. Your health care provider will use the results of this test and other tests to make a diagnosis.   This information   is not intended to replace advice given to you by your health care provider. Make sure you discuss any questions you have with your health care provider.   Document Released: 07/16/2004 Document Revised: 07/20/2014 Document Reviewed: 10/25/2013 Elsevier Interactive Patient  Education 2016 Elsevier Inc.  

## 2016-04-01 ENCOUNTER — Other Ambulatory Visit: Payer: Self-pay | Admitting: Family

## 2016-04-02 ENCOUNTER — Other Ambulatory Visit: Payer: Self-pay | Admitting: Family

## 2016-04-02 DIAGNOSIS — J441 Chronic obstructive pulmonary disease with (acute) exacerbation: Secondary | ICD-10-CM | POA: Diagnosis not present

## 2016-04-07 DIAGNOSIS — J441 Chronic obstructive pulmonary disease with (acute) exacerbation: Secondary | ICD-10-CM | POA: Diagnosis not present

## 2016-04-10 ENCOUNTER — Ambulatory Visit (HOSPITAL_BASED_OUTPATIENT_CLINIC_OR_DEPARTMENT_OTHER): Payer: PPO

## 2016-04-10 ENCOUNTER — Other Ambulatory Visit: Payer: Self-pay | Admitting: *Deleted

## 2016-04-10 VITALS — BP 126/95 | HR 84 | Temp 98.2°F | Resp 16

## 2016-04-10 DIAGNOSIS — M1712 Unilateral primary osteoarthritis, left knee: Secondary | ICD-10-CM

## 2016-04-10 DIAGNOSIS — E291 Testicular hypofunction: Secondary | ICD-10-CM | POA: Diagnosis not present

## 2016-04-10 DIAGNOSIS — E349 Endocrine disorder, unspecified: Secondary | ICD-10-CM

## 2016-04-10 DIAGNOSIS — M1A40X0 Other secondary chronic gout, unspecified site, without tophus (tophi): Secondary | ICD-10-CM

## 2016-04-10 MED ORDER — TESTOSTERONE CYPIONATE 200 MG/ML IM SOLN
300.0000 mg | INTRAMUSCULAR | Status: DC
  Administered 2016-04-10: 300 mg via INTRAMUSCULAR

## 2016-04-10 MED ORDER — TESTOSTERONE CYPIONATE 200 MG/ML IM SOLN
INTRAMUSCULAR | Status: AC
  Filled 2016-04-10: qty 2

## 2016-04-10 NOTE — Patient Instructions (Signed)

## 2016-04-14 ENCOUNTER — Telehealth: Payer: Self-pay | Admitting: Internal Medicine

## 2016-04-14 ENCOUNTER — Other Ambulatory Visit: Payer: Self-pay | Admitting: Family

## 2016-04-14 NOTE — Telephone Encounter (Signed)
ATC, on hold for over 5 min WCB 10/4

## 2016-04-15 NOTE — Telephone Encounter (Signed)
Who ordered sleep study?  Dr. Brand Males, M.D., [facility].C.P Pulmonary and Critical Care Medicine Staff Physician [facility] Pager: [phone], If no answer or between  15:00h - 7:00h: call [phone]  04/15/2016 9:55 AM

## 2016-04-15 NOTE — Telephone Encounter (Signed)
Spoke with James Zuniga. She states that their office received a fax from [employer] wanting them to order a sleep study. The pt is not requesting to have this done. James Zuniga is wanting to know if MR thinks the pt needs a sleep study.  MR - please advise. Thanks.

## 2016-04-15 NOTE — Telephone Encounter (Signed)
[facility] [place]

## 2016-04-15 NOTE — Telephone Encounter (Signed)
And who is [facility]?  There is no rush for sleep study - can discuss at routine OV   Thanks  Dr. Brand Males, M.D., [facility].C.P Pulmonary and Critical Care Medicine Staff Physician [facility] Pager: [phone], If no answer or between  15:00h - 7:00h: call [phone]  04/15/2016 10:13 AM

## 2016-04-15 NOTE — Telephone Encounter (Signed)
No one ordered a sleep study. His PCP's office received a fax from [facility] recommending pt have a sleep study.

## 2016-04-15 NOTE — Telephone Encounter (Signed)
LMTCB for [employer]

## 2016-04-24 ENCOUNTER — Ambulatory Visit (HOSPITAL_BASED_OUTPATIENT_CLINIC_OR_DEPARTMENT_OTHER): Payer: PPO

## 2016-04-24 VITALS — BP 127/88 | HR 82 | Temp 98.0°F | Resp 18

## 2016-04-24 DIAGNOSIS — E349 Endocrine disorder, unspecified: Secondary | ICD-10-CM

## 2016-04-24 DIAGNOSIS — E291 Testicular hypofunction: Secondary | ICD-10-CM

## 2016-04-24 MED ORDER — TESTOSTERONE CYPIONATE 200 MG/ML IM SOLN
INTRAMUSCULAR | Status: AC
  Filled 2016-04-24: qty 2

## 2016-04-24 MED ORDER — TESTOSTERONE CYPIONATE 200 MG/ML IM SOLN
300.0000 mg | INTRAMUSCULAR | Status: DC
  Administered 2016-04-24: 300 mg via INTRAMUSCULAR

## 2016-04-24 NOTE — Patient Instructions (Signed)

## 2016-05-02 DIAGNOSIS — J441 Chronic obstructive pulmonary disease with (acute) exacerbation: Secondary | ICD-10-CM | POA: Diagnosis not present

## 2016-05-07 DIAGNOSIS — J441 Chronic obstructive pulmonary disease with (acute) exacerbation: Secondary | ICD-10-CM | POA: Diagnosis not present

## 2016-05-08 ENCOUNTER — Ambulatory Visit (HOSPITAL_BASED_OUTPATIENT_CLINIC_OR_DEPARTMENT_OTHER): Payer: PPO

## 2016-05-08 VITALS — BP 129/99 | HR 78 | Temp 97.9°F | Resp 16

## 2016-05-08 DIAGNOSIS — E349 Endocrine disorder, unspecified: Secondary | ICD-10-CM

## 2016-05-08 DIAGNOSIS — E291 Testicular hypofunction: Secondary | ICD-10-CM | POA: Diagnosis not present

## 2016-05-08 MED ORDER — TESTOSTERONE CYPIONATE 200 MG/ML IM SOLN
300.0000 mg | INTRAMUSCULAR | Status: DC
  Administered 2016-05-08: 300 mg via INTRAMUSCULAR

## 2016-05-08 MED ORDER — TESTOSTERONE CYPIONATE 200 MG/ML IM SOLN
INTRAMUSCULAR | Status: AC
  Filled 2016-05-08: qty 2

## 2016-05-08 NOTE — Patient Instructions (Signed)

## 2016-05-22 ENCOUNTER — Ambulatory Visit (HOSPITAL_BASED_OUTPATIENT_CLINIC_OR_DEPARTMENT_OTHER): Payer: PPO

## 2016-05-22 VITALS — BP 154/98 | HR 67 | Temp 98.0°F

## 2016-05-22 DIAGNOSIS — E291 Testicular hypofunction: Secondary | ICD-10-CM | POA: Diagnosis not present

## 2016-05-22 DIAGNOSIS — Z23 Encounter for immunization: Secondary | ICD-10-CM

## 2016-05-22 DIAGNOSIS — E349 Endocrine disorder, unspecified: Secondary | ICD-10-CM

## 2016-05-22 MED ORDER — TESTOSTERONE CYPIONATE 200 MG/ML IM SOLN
INTRAMUSCULAR | Status: AC
  Filled 2016-05-22: qty 2

## 2016-05-22 MED ORDER — TESTOSTERONE CYPIONATE 200 MG/ML IM SOLN
300.0000 mg | INTRAMUSCULAR | Status: DC
  Administered 2016-05-22: 300 mg via INTRAMUSCULAR

## 2016-05-22 MED ORDER — INFLUENZA VAC SPLIT QUAD 0.5 ML IM SUSY
0.5000 mL | PREFILLED_SYRINGE | Freq: Once | INTRAMUSCULAR | Status: AC
  Administered 2016-05-22: 0.5 mL via INTRAMUSCULAR
  Filled 2016-05-22: qty 0.5

## 2016-05-22 NOTE — Patient Instructions (Signed)
Influenza Virus Vaccine injection What is this medicine? INFLUENZA VIRUS VACCINE (in floo EN zuh VAHY ruhs vak SEEN) helps to reduce the risk of getting influenza also known as the flu. The vaccine only helps protect you against some strains of the flu. This medicine may be used for other purposes; ask your health care provider or pharmacist if you have questions. What should I tell my health care provider before I take this medicine? They need to know if you have any of these conditions: -bleeding disorder like hemophilia -fever or infection -Guillain-Barre syndrome or other neurological problems -immune system problems -infection with the human immunodeficiency virus (HIV) or AIDS -low blood platelet counts -multiple sclerosis -an unusual or allergic reaction to influenza virus vaccine, latex, other medicines, foods, dyes, or preservatives. Different brands of vaccines contain different allergens. Some may contain latex or eggs. Talk to your doctor about your allergies to make sure that you get the right vaccine. -pregnant or trying to get pregnant -breast-feeding How should I use this medicine? This vaccine is for injection into a muscle or under the skin. It is given by a health care professional. A copy of Vaccine Information Statements will be given before each vaccination. Read this sheet carefully each time. The sheet may change frequently. Talk to your healthcare provider to see which vaccines are right for you. Some vaccines should not be used in all age groups. Overdosage: If you think you have taken too much of this medicine contact a poison control center or emergency room at once. NOTE: This medicine is only for you. Do not share this medicine with others. What if I miss a dose? This does not apply. What may interact with this medicine? -chemotherapy or radiation therapy -medicines that lower your immune system like etanercept, anakinra, infliximab, and adalimumab -medicines  that treat or prevent blood clots like warfarin -phenytoin -steroid medicines like prednisone or cortisone -theophylline -vaccines This list may not describe all possible interactions. Give your health care provider a list of all the medicines, herbs, non-prescription drugs, or dietary supplements you use. Also tell them if you smoke, drink alcohol, or use illegal drugs. Some items may interact with your medicine. What should I watch for while using this medicine? Report any side effects that do not go away within 3 days to your doctor or health care professional. Call your health care provider if any unusual symptoms occur within 6 weeks of receiving this vaccine. You may still catch the flu, but the illness is not usually as bad. You cannot get the flu from the vaccine. The vaccine will not protect against colds or other illnesses that may cause fever. The vaccine is needed every year. What side effects may I notice from receiving this medicine? Side effects that you should report to your doctor or health care professional as soon as possible: -allergic reactions like skin rash, itching or hives, swelling of the face, lips, or tongue Side effects that usually do not require medical attention (report to your doctor or health care professional if they continue or are bothersome): -fever -headache -muscle aches and pains -pain, tenderness, redness, or swelling at the injection site -tiredness This list may not describe all possible side effects. Call your doctor for medical advice about side effects. You may report side effects to FDA at 1-800-FDA-1088. Where should I keep my medicine? The vaccine will be given by a health care professional in a clinic, pharmacy, doctor's office, or other health care setting. You will not   be given vaccine doses to store at home. NOTE: This sheet is a summary. It may not cover all possible information. If you have questions about this medicine, talk to your  doctor, pharmacist, or health care provider.    2016, Elsevier/Gold Standard. (2015-01-18 10:07:28) Testosterone injection What is this medicine? TESTOSTERONE (tes TOS ter one) is the main male hormone. It supports normal male development such as muscle growth, facial hair, and deep voice. It is used in males to treat low testosterone levels. This medicine may be used for other purposes; ask your health care provider or pharmacist if you have questions. What should I tell my health care provider before I take this medicine? They need to know if you have any of these conditions: -breast cancer -diabetes -heart disease -kidney disease -liver disease -lung disease -prostate cancer, enlargement -an unusual or allergic reaction to testosterone, other medicines, foods, dyes, or preservatives -pregnant or trying to get pregnant -breast-feeding How should I use this medicine? This medicine is for injection into a muscle. It is usually given by a health care professional in a hospital or clinic setting. Contact your pediatrician regarding the use of this medicine in children. While this medicine may be prescribed for children as young as 20 years of age for selected conditions, precautions do apply. Overdosage: If you think you have taken too much of this medicine contact a poison control center or emergency room at once. NOTE: This medicine is only for you. Do not share this medicine with others. What if I miss a dose? Try not to miss a dose. Your doctor or health care professional will tell you when your next injection is due. Notify the office if you are unable to keep an appointment. What may interact with this medicine? -medicines for diabetes -medicines that treat or prevent blood clots like warfarin -oxyphenbutazone -propranolol -steroid medicines like prednisone or cortisone This list may not describe all possible interactions. Give your health care provider a list of all the  medicines, herbs, non-prescription drugs, or dietary supplements you use. Also tell them if you smoke, drink alcohol, or use illegal drugs. Some items may interact with your medicine. What should I watch for while using this medicine? Visit your doctor or health care professional for regular checks on your progress. They will need to check the level of testosterone in your blood. This medicine is only approved for use in men who have low levels of testosterone related to certain medical conditions. Heart attacks and strokes have been reported with the use of this medicine. Notify your doctor or health care professional and seek emergency treatment if you develop breathing problems; changes in vision; confusion; chest pain or chest tightness; sudden arm pain; severe, sudden headache; trouble speaking or understanding; sudden numbness or weakness of the face, arm or leg; loss of balance or coordination. Talk to your doctor about the risks and benefits of this medicine. This medicine may affect blood sugar levels. If you have diabetes, check with your doctor or health care professional before you change your diet or the dose of your diabetic medicine. This drug is banned from use in athletes by most athletic organizations. What side effects may I notice from receiving this medicine? Side effects that you should report to your doctor or health care professional as soon as possible: -allergic reactions like skin rash, itching or hives, swelling of the face, lips, or tongue -breast enlargement -breathing problems -changes in mood, especially anger, depression, or rage -dark urine -general  ill feeling or flu-like symptoms -light-colored stools -loss of appetite, nausea -nausea, vomiting -right upper belly pain -stomach pain -swelling of ankles -too frequent or persistent erections -trouble passing urine or change in the amount of urine -unusually weak or tired -yellowing of the eyes or  skin Additional side effects that can occur in women include: -deep or hoarse voice -facial hair growth -irregular menstrual periods Side effects that usually do not require medical attention (report to your doctor or health care professional if they continue or are bothersome): -acne -change in sex drive or performance -hair loss -headache This list may not describe all possible side effects. Call your doctor for medical advice about side effects. You may report side effects to FDA at 1-800-FDA-1088. Where should I keep my medicine? Keep out of the reach of children. This medicine can be abused. Keep your medicine in a safe place to protect it from theft. Do not share this medicine with anyone. Selling or giving away this medicine is dangerous and against the law. Store at room temperature between 20 and 25 degrees C (68 and 77 degrees F). Do not freeze. Protect from light. Follow the directions for the product you are prescribed. Throw away any unused medicine after the expiration date. NOTE: This sheet is a summary. It may not cover all possible information. If you have questions about this medicine, talk to your doctor, pharmacist, or health care provider.    2016, Elsevier/Gold Standard. (2013-09-14 [id number])

## 2016-06-02 DIAGNOSIS — J441 Chronic obstructive pulmonary disease with (acute) exacerbation: Secondary | ICD-10-CM | POA: Diagnosis not present

## 2016-06-05 ENCOUNTER — Ambulatory Visit (HOSPITAL_BASED_OUTPATIENT_CLINIC_OR_DEPARTMENT_OTHER): Payer: PPO

## 2016-06-05 VITALS — BP 158/97 | HR 73 | Temp 97.6°F | Resp 20

## 2016-06-05 DIAGNOSIS — E349 Endocrine disorder, unspecified: Secondary | ICD-10-CM

## 2016-06-05 DIAGNOSIS — E291 Testicular hypofunction: Secondary | ICD-10-CM | POA: Diagnosis not present

## 2016-06-05 MED ORDER — TESTOSTERONE CYPIONATE 200 MG/ML IM SOLN
INTRAMUSCULAR | Status: AC
  Filled 2016-06-05: qty 2

## 2016-06-05 MED ORDER — TESTOSTERONE CYPIONATE 200 MG/ML IM SOLN
300.0000 mg | INTRAMUSCULAR | Status: DC
  Administered 2016-06-05: 300 mg via INTRAMUSCULAR

## 2016-06-05 NOTE — Patient Instructions (Signed)
Testosterone injection What is this medicine? TESTOSTERONE (tes TOS ter one) is the main male hormone. It supports normal male development such as muscle growth, facial hair, and deep voice. It is used in males to treat low testosterone levels. This medicine may be used for other purposes; ask your health care provider or pharmacist if you have questions. COMMON BRAND NAME(S): Andro-L.A., Aveed, Delatestryl, Depo-Testosterone, Virilon What should I tell my health care provider before I take this medicine? They need to know if you have any of these conditions: -cancer -diabetes -heart disease -kidney disease -liver disease -lung disease -prostate disease -an unusual or allergic reaction to testosterone, other medicines, foods, dyes, or preservatives -pregnant or trying to get pregnant -breast-feeding How should I use this medicine? This medicine is for injection into a muscle. It is usually given by a health care professional in a hospital or clinic setting. Contact your pediatrician regarding the use of this medicine in children. While this medicine may be prescribed for children as young as 12 years of age for selected conditions, precautions do apply. Overdosage: If you think you have taken too much of this medicine contact a poison control center or emergency room at once. NOTE: This medicine is only for you. Do not share this medicine with others. What if I miss a dose? Try not to miss a dose. Your doctor or health care professional will tell you when your next injection is due. Notify the office if you are unable to keep an appointment. What may interact with this medicine? -medicines for diabetes -medicines that treat or prevent blood clots like warfarin -oxyphenbutazone -propranolol -steroid medicines like prednisone or cortisone This list may not describe all possible interactions. Give your health care provider a list of all the medicines, herbs, non-prescription drugs, or  dietary supplements you use. Also tell them if you smoke, drink alcohol, or use illegal drugs. Some items may interact with your medicine. What should I watch for while using this medicine? Visit your doctor or health care professional for regular checks on your progress. They will need to check the level of testosterone in your blood. This medicine is only approved for use in men who have low levels of testosterone related to certain medical conditions. Heart attacks and strokes have been reported with the use of this medicine. Notify your doctor or health care professional and seek emergency treatment if you develop breathing problems; changes in vision; confusion; chest pain or chest tightness; sudden arm pain; severe, sudden headache; trouble speaking or understanding; sudden numbness or weakness of the face, arm or leg; loss of balance or coordination. Talk to your doctor about the risks and benefits of this medicine. This medicine may affect blood sugar levels. If you have diabetes, check with your doctor or health care professional before you change your diet or the dose of your diabetic medicine. Testosterone injections are not commonly used in women. Women should inform their doctor if they wish to become pregnant or think they might be pregnant. There is a potential for serious side effects to an unborn child. Talk to your health care professional or pharmacist for more information. Talk with your doctor or health care professional about your birth control options while taking this medicine. This drug is banned from use in athletes by most athletic organizations. What side effects may I notice from receiving this medicine? Side effects that you should report to your doctor or health care professional as soon as possible: -allergic reactions like skin rash,   itching or hives, swelling of the face, lips, or tongue -breast enlargement -breathing problems -changes in emotions or moods -deep or  hoarse voice -irregular menstrual periods -signs and symptoms of liver injury like dark yellow or brown urine; general ill feeling or flu-like symptoms; light-colored stools; loss of appetite; nausea; right upper belly pain; unusually weak or tired; yellowing of the eyes or skin -stomach pain -swelling of the ankles, feet, hands -too frequent or persistent erections -trouble passing urine or change in the amount of urine Side effects that usually do not require medical attention (report to your doctor or health care professional if they continue or are bothersome): -acne -change in sex drive or performance -facial hair growth -hair loss -headache This list may not describe all possible side effects. Call your doctor for medical advice about side effects. You may report side effects to FDA at 1-800-FDA-1088. Where should I keep my medicine? Keep out of the reach of children. This medicine can be abused. Keep your medicine in a safe place to protect it from theft. Do not share this medicine with anyone. Selling or giving away this medicine is dangerous and against the law. Store at room temperature between 20 and 25 degrees C (68 and 77 degrees F). Do not freeze. Protect from light. Follow the directions for the product you are prescribed. Throw away any unused medicine after the expiration date. NOTE: This sheet is a summary. It may not cover all possible information. If you have questions about this medicine, talk to your doctor, pharmacist, or health care provider.  2017 Elsevier/Gold Standard (2015-08-03 07:33:55)  

## 2016-06-07 DIAGNOSIS — J441 Chronic obstructive pulmonary disease with (acute) exacerbation: Secondary | ICD-10-CM | POA: Diagnosis not present

## 2016-06-08 DIAGNOSIS — I10 Essential (primary) hypertension: Secondary | ICD-10-CM | POA: Diagnosis not present

## 2016-06-08 DIAGNOSIS — E876 Hypokalemia: Secondary | ICD-10-CM | POA: Diagnosis not present

## 2016-06-08 DIAGNOSIS — E118 Type 2 diabetes mellitus with unspecified complications: Secondary | ICD-10-CM | POA: Diagnosis not present

## 2016-06-08 DIAGNOSIS — L0102 Bockhart's impetigo: Secondary | ICD-10-CM | POA: Diagnosis not present

## 2016-06-08 DIAGNOSIS — E785 Hyperlipidemia, unspecified: Secondary | ICD-10-CM | POA: Diagnosis not present

## 2016-06-08 DIAGNOSIS — J42 Unspecified chronic bronchitis: Secondary | ICD-10-CM | POA: Diagnosis not present

## 2016-06-08 DIAGNOSIS — G4700 Insomnia, unspecified: Secondary | ICD-10-CM | POA: Diagnosis not present

## 2016-06-09 DIAGNOSIS — E118 Type 2 diabetes mellitus with unspecified complications: Secondary | ICD-10-CM | POA: Diagnosis not present

## 2016-06-12 DIAGNOSIS — J4520 Mild intermittent asthma, uncomplicated: Secondary | ICD-10-CM | POA: Diagnosis not present

## 2016-06-19 ENCOUNTER — Other Ambulatory Visit (HOSPITAL_BASED_OUTPATIENT_CLINIC_OR_DEPARTMENT_OTHER): Payer: PPO

## 2016-06-19 ENCOUNTER — Ambulatory Visit (HOSPITAL_BASED_OUTPATIENT_CLINIC_OR_DEPARTMENT_OTHER): Payer: PPO | Admitting: Hematology & Oncology

## 2016-06-19 ENCOUNTER — Ambulatory Visit (HOSPITAL_BASED_OUTPATIENT_CLINIC_OR_DEPARTMENT_OTHER): Payer: PPO

## 2016-06-19 ENCOUNTER — Ambulatory Visit: Payer: Self-pay

## 2016-06-19 VITALS — BP 146/110 | HR 77 | Temp 97.3°F | Resp 16 | Wt 213.1 lb

## 2016-06-19 DIAGNOSIS — E349 Endocrine disorder, unspecified: Secondary | ICD-10-CM | POA: Diagnosis not present

## 2016-06-19 DIAGNOSIS — E291 Testicular hypofunction: Secondary | ICD-10-CM | POA: Diagnosis not present

## 2016-06-19 DIAGNOSIS — D509 Iron deficiency anemia, unspecified: Secondary | ICD-10-CM | POA: Diagnosis not present

## 2016-06-19 DIAGNOSIS — K769 Liver disease, unspecified: Secondary | ICD-10-CM | POA: Diagnosis not present

## 2016-06-19 DIAGNOSIS — Z720 Tobacco use: Secondary | ICD-10-CM

## 2016-06-19 LAB — CBC WITH DIFFERENTIAL (CANCER CENTER ONLY)
BASO#: 0 10*3/uL (ref 0.0–0.2)
BASO%: 0.6 % (ref 0.0–2.0)
EOS ABS: 0.2 10*3/uL (ref 0.0–0.5)
EOS%: 3.2 % (ref 0.0–7.0)
HCT: 51.8 % — ABNORMAL HIGH (ref 38.7–49.9)
HGB: 17.4 g/dL — ABNORMAL HIGH (ref 13.0–17.1)
LYMPH#: 2.1 10*3/uL (ref 0.9–3.3)
LYMPH%: 28.7 % (ref 14.0–48.0)
MCH: 30.1 pg (ref 28.0–33.4)
MCHC: 33.6 g/dL (ref 32.0–35.9)
MCV: 90 fL (ref 82–98)
MONO#: 0.4 10*3/uL (ref 0.1–0.9)
MONO%: 5.8 % (ref 0.0–13.0)
NEUT#: 4.5 10*3/uL (ref 1.5–6.5)
NEUT%: 61.7 % (ref 40.0–80.0)
PLATELETS: 334 10*3/uL (ref 145–400)
RBC: 5.79 10*6/uL — ABNORMAL HIGH (ref 4.20–5.70)
RDW: 13.9 % (ref 11.1–15.7)
WBC: 7.3 10*3/uL (ref 4.0–10.0)

## 2016-06-19 LAB — IRON AND TIBC
%SAT: 16 % — ABNORMAL LOW (ref 20–55)
IRON: 51 ug/dL (ref 42–163)
TIBC: 315 ug/dL (ref 202–409)
UIBC: 265 ug/dL (ref 117–376)

## 2016-06-19 LAB — COMPREHENSIVE METABOLIC PANEL
ALBUMIN: 3.6 g/dL (ref 3.5–5.0)
ALK PHOS: 114 U/L (ref 40–150)
ALT: 36 U/L (ref 0–55)
ANION GAP: 11 meq/L (ref 3–11)
AST: 25 U/L (ref 5–34)
BUN: 11.1 mg/dL (ref 7.0–26.0)
CALCIUM: 9.5 mg/dL (ref 8.4–10.4)
CHLORIDE: 102 meq/L (ref 98–109)
CO2: 28 mEq/L (ref 22–29)
Creatinine: 1.3 mg/dL (ref 0.7–1.3)
EGFR: 70 mL/min/{1.73_m2} — AB (ref >90)
Glucose: 118 mg/dl (ref 70–140)
POTASSIUM: 3.3 meq/L — AB (ref 3.5–5.1)
Sodium: 141 mEq/L (ref 136–145)
Total Bilirubin: 0.33 mg/dL (ref 0.20–1.20)
Total Protein: 8.2 g/dL (ref 6.4–8.3)

## 2016-06-19 LAB — FERRITIN: FERRITIN: 259 ng/mL (ref 22–316)

## 2016-06-19 MED ORDER — TESTOSTERONE CYPIONATE 200 MG/ML IM SOLN
300.0000 mg | INTRAMUSCULAR | Status: DC
  Administered 2016-06-19: 300 mg via INTRAMUSCULAR

## 2016-06-19 MED ORDER — TESTOSTERONE CYPIONATE 200 MG/ML IM SOLN
INTRAMUSCULAR | Status: AC
  Filled 2016-06-19: qty 2

## 2016-06-19 NOTE — Progress Notes (Signed)
Hematology and Oncology Follow Up Visit  James Zuniga [id number] 02/12/1956 60 y.o. 06/19/2016   Principle Diagnosis:   Iron deficiency anemia  hypotestosteronemia  Chronic liver lesions  Current Therapy:    IV iron as indicated  Depo- testosterone 326m IM Q2 weeks     Interim History:  [name] back for followup. He actually looks fairly good. He is doing pulmonary physical therapy. This is helping him. He has a little bit more stamina. Sounds like he will have a sleep study. He probably has some sleep apnea.  He is gaining weight. He is no longer smoking. I this is definitely a good thing for him as he does have some underlying lung disease. Hopefully, he will be able to exercise a little more. He has bad knees., Surety to have anything done with this.  He definitely is not anemic. I think the fact that he gets his testosterone is the likely reason for this. We have not given him any iron probably for a couple years. His ferritin back in September was 301. His iron saturation was 37%.  His knees do bother him. He still not yet seen an orthopedic surgeon.  He is still smoking. He says he smokes 4-5 cigarettes a day. This really is not all that bad. I told him that it will take quite a lot of get off cigarettes. However, as long as he is staying diligent, he will get off.  His testosterone levels have been adequate. His quality of life is much better with the testosterone.  He has bad arthritis. I'm sure what can be done. I don't think he really is too interested in seeing orthopedic surgery.  Overall, his performance status is ECOG 2. Medications:  Current Outpatient Prescriptions:  .  albuterol (PROVENTIL HFA;VENTOLIN HFA) 108 (90 Base) MCG/ACT inhaler, Inhale 1-2 puffs into the lungs every 6 (six) hours as needed for wheezing., Disp: 1 Inhaler, Rfl: 0 .  allopurinol (ZYLOPRIM) 300 MG tablet, Take 300 mg by mouth daily., Disp: , Rfl:  .  amLODipine (NORVASC) 10 MG  tablet, 10 mg daily., Disp: , Rfl:  .  aspirin 81 MG EC tablet, Take 81 mg by mouth daily. Swallow whole., Disp: , Rfl:  .  atorvastatin (LIPITOR) 10 MG tablet, TAKE 1 TABLET BY MOUTH DAILY, Disp: , Rfl:  .  B-D ULTRA-FINE 33 LANCETS MISC, by Does not apply route., Disp: , Rfl:  .  Blood Glucose Monitoring Suppl (FIFTY50 GLUCOSE METER 2.0) w/Device KIT, Use as instructed, Disp: , Rfl:  .  BREO ELLIPTA 100-25 MCG/INH AEPB, INL 1 PUFF PO D, Disp: 1 each, Rfl: 5 .  Cholecalciferol (VITAMIN D3) 1000 UNITS CAPS, Take by mouth., Disp: , Rfl:  .  diclofenac sodium (VOLTAREN) 1 % GEL, Apply topically., Disp: , Rfl:  .  DULoxetine (CYMBALTA) 60 MG capsule, Take one daily, Disp: , Rfl:  .  furosemide (LASIX) 20 MG tablet, Take 1 tablet by mouth 2 (two) times daily., Disp: , Rfl:  .  gabapentin (NEURONTIN) 300 MG capsule, TAKE 1 CAPSULE BY MOUTH THREE TIMES DAILY, Disp: , Rfl:  .  ibuprofen (ADVIL,MOTRIN) 600 MG tablet, 600 mg., Disp: , Rfl: 2 .  losartan (COZAAR) 50 MG tablet, Take 50 mg by mouth daily. , Disp: , Rfl:  .  metoprolol tartrate (LOPRESSOR) 25 MG tablet, Take 25 mg by mouth daily. , Disp: , Rfl:  .  omeprazole (PRILOSEC) 20 MG capsule, Take 20 mg by mouth daily. ,  Disp: , Rfl: 0 .  ONE TOUCH ULTRA TEST test strip, 2 (two) times daily. for testing, Disp: , Rfl: 11 .  potassium chloride SA (K-DUR,KLOR-CON) 20 MEQ tablet, Take 1 tablet (20 mEq total) by mouth 2 (two) times daily., Disp: 30 tablet, Rfl: 3 .  tamsulosin (FLOMAX) 0.4 MG CAPS capsule, Take 1 capsule (0.4 mg total) by mouth daily after breakfast., Disp: 30 capsule, Rfl: 12 .  tiotropium (SPIRIVA) 18 MCG inhalation capsule, Place 1 capsule (18 mcg total) into inhaler and inhale daily., Disp: 30 capsule, Rfl: 6 .  tiZANidine (ZANAFLEX) 4 MG tablet, 4 mg 3 (three) times daily as needed., Disp: , Rfl: 0 .  traMADol (ULTRAM) 50 MG tablet, Take 50 mg by mouth daily as needed for moderate pain. , Disp: , Rfl:  .  zolpidem (AMBIEN) 10 MG  tablet, 10 mg at bedtime as needed., Disp: , Rfl:  No current facility-administered medications for this visit.   Facility-Administered Medications Ordered in Other Visits:  .  testosterone cypionate (DEPOTESTOSTERONE CYPIONATE) injection 300 mg, 300 mg, Intramuscular, Q14 Days, [name], NP  Allergies: No Known Allergies  Past Medical History, Surgical history, Social history, and Family History were reviewed and updated.  Review of Systems: As above  Physical Exam:  weight is 213 lb 1.9 oz (96.7 kg). His oral temperature is 97.3 F (36.3 C). His blood pressure is 146/110 (abnormal) and his pulse is 77. His respiration is 16 and oxygen saturation is 97%.   Well-developed and well-nourished African American gentleman. Head and neck exam shows no ocular or oral lesions. He does have a bit of firmness and slight tenderness in about a centimeter area just to the left of the angle of his mouth. This is where he has this abscess. He has no adenopathy. Lungs are clear. Cardiac exam is regular rate and rhythm. There are no murmurs, rubs or bruits. Abdomen is soft. Has good bowel sounds. There is no fluid wave. There is no palpable liver or spleen tip. Back exam shows no tenderness over the spine ribs or hips. Extremities shows no clubbing, cyanosis or edema. He has a knee brace on the left knee. He has some slight swelling in the left leg. Skin exam shows no rashes, ecchymoses or petechia. Neurological exam is nonfocal.  Lab Results  Component Value Date   WBC 7.3 06/19/2016   HGB 17.4 (H) 06/19/2016   HCT 51.8 (H) 06/19/2016   MCV 90 06/19/2016   PLT 334 06/19/2016     Chemistry      Component Value Date/Time   NA 136 03/13/2016 0808   NA 142 12/06/2015 0817   K 2.5 (LL) 03/13/2016 0808   K 3.1 (L) 12/06/2015 0817   CL 97 (L) 03/13/2016 0808   CO2 31 03/13/2016 0808   CO2 28 12/06/2015 0817   BUN 9 03/13/2016 0808   BUN 9.6 12/06/2015 0817   CREATININE 1.5 (H) 03/13/2016  0808   CREATININE 1.2 12/06/2015 0817      Component Value Date/Time   CALCIUM 9.3 03/13/2016 0808   CALCIUM 9.9 12/06/2015 0817   ALKPHOS 78 03/13/2016 0808   ALKPHOS 99 12/06/2015 0817   AST 20 03/13/2016 0808   AST 24 12/06/2015 0817   ALT 21 03/13/2016 0808   ALT 31 12/06/2015 0817   BILITOT 0.90 03/13/2016 0808   BILITOT 0.53 12/06/2015 0817         Impression and Plan: James Zuniga is 60 year old African-American male. He has  multifactorial anemia. We have taken care of this nicely. He gets IV iron on occasion although we have not had to give him any for many months.  His hemoglobin is actually a high side. I'm sure this probably is from the testosterone. I told him that he probably should consider blood donation to the [facility]. I this would be a very good idea for him. This will also help his hypertension.  The testosterone clearly makes his life better. He is much more functional. I would have to cut back on this.  We will plan to see him back in another 6 weeks.Nile Riggs, MD 12/8/20178:59 AM

## 2016-06-19 NOTE — Patient Instructions (Signed)
Testosterone injection What is this medicine? TESTOSTERONE (tes TOS ter one) is the main male hormone. It supports normal male development such as muscle growth, facial hair, and deep voice. It is used in males to treat low testosterone levels. This medicine may be used for other purposes; ask your health care provider or pharmacist if you have questions. COMMON BRAND NAME(S): Andro-L.A., Aveed, Delatestryl, Depo-Testosterone, Virilon What should I tell my health care provider before I take this medicine? They need to know if you have any of these conditions: -cancer -diabetes -heart disease -kidney disease -liver disease -lung disease -prostate disease -an unusual or allergic reaction to testosterone, other medicines, foods, dyes, or preservatives -pregnant or trying to get pregnant -breast-feeding How should I use this medicine? This medicine is for injection into a muscle. It is usually given by a health care professional in a hospital or clinic setting. Contact your pediatrician regarding the use of this medicine in children. While this medicine may be prescribed for children as young as 12 years of age for selected conditions, precautions do apply. Overdosage: If you think you have taken too much of this medicine contact a poison control center or emergency room at once. NOTE: This medicine is only for you. Do not share this medicine with others. What if I miss a dose? Try not to miss a dose. Your doctor or health care professional will tell you when your next injection is due. Notify the office if you are unable to keep an appointment. What may interact with this medicine? -medicines for diabetes -medicines that treat or prevent blood clots like warfarin -oxyphenbutazone -propranolol -steroid medicines like prednisone or cortisone This list may not describe all possible interactions. Give your health care provider a list of all the medicines, herbs, non-prescription drugs, or  dietary supplements you use. Also tell them if you smoke, drink alcohol, or use illegal drugs. Some items may interact with your medicine. What should I watch for while using this medicine? Visit your doctor or health care professional for regular checks on your progress. They will need to check the level of testosterone in your blood. This medicine is only approved for use in men who have low levels of testosterone related to certain medical conditions. Heart attacks and strokes have been reported with the use of this medicine. Notify your doctor or health care professional and seek emergency treatment if you develop breathing problems; changes in vision; confusion; chest pain or chest tightness; sudden arm pain; severe, sudden headache; trouble speaking or understanding; sudden numbness or weakness of the face, arm or leg; loss of balance or coordination. Talk to your doctor about the risks and benefits of this medicine. This medicine may affect blood sugar levels. If you have diabetes, check with your doctor or health care professional before you change your diet or the dose of your diabetic medicine. Testosterone injections are not commonly used in women. Women should inform their doctor if they wish to become pregnant or think they might be pregnant. There is a potential for serious side effects to an unborn child. Talk to your health care professional or pharmacist for more information. Talk with your doctor or health care professional about your birth control options while taking this medicine. This drug is banned from use in athletes by most athletic organizations. What side effects may I notice from receiving this medicine? Side effects that you should report to your doctor or health care professional as soon as possible: -allergic reactions like skin rash,   itching or hives, swelling of the face, lips, or tongue -breast enlargement -breathing problems -changes in emotions or moods -deep or  hoarse voice -irregular menstrual periods -signs and symptoms of liver injury like dark yellow or brown urine; general ill feeling or flu-like symptoms; light-colored stools; loss of appetite; nausea; right upper belly pain; unusually weak or tired; yellowing of the eyes or skin -stomach pain -swelling of the ankles, feet, hands -too frequent or persistent erections -trouble passing urine or change in the amount of urine Side effects that usually do not require medical attention (report to your doctor or health care professional if they continue or are bothersome): -acne -change in sex drive or performance -facial hair growth -hair loss -headache This list may not describe all possible side effects. Call your doctor for medical advice about side effects. You may report side effects to FDA at 1-800-FDA-1088. Where should I keep my medicine? Keep out of the reach of children. This medicine can be abused. Keep your medicine in a safe place to protect it from theft. Do not share this medicine with anyone. Selling or giving away this medicine is dangerous and against the law. Store at room temperature between 20 and 25 degrees C (68 and 77 degrees F). Do not freeze. Protect from light. Follow the directions for the product you are prescribed. Throw away any unused medicine after the expiration date. NOTE: This sheet is a summary. It may not cover all possible information. If you have questions about this medicine, talk to your doctor, pharmacist, or health care provider.  2017 Elsevier/Gold Standard (2015-08-03 07:33:55)  

## 2016-06-20 LAB — TESTOSTERONE: Testosterone, Serum: 550 ng/dL (ref 264–916)

## 2016-06-23 ENCOUNTER — Telehealth: Payer: Self-pay | Admitting: *Deleted

## 2016-06-23 DIAGNOSIS — G4733 Obstructive sleep apnea (adult) (pediatric): Secondary | ICD-10-CM | POA: Diagnosis not present

## 2016-06-23 DIAGNOSIS — R0602 Shortness of breath: Secondary | ICD-10-CM | POA: Diagnosis not present

## 2016-06-23 NOTE — Telephone Encounter (Signed)
-----   Message from [name], NP sent at 06/19/2016  3:04 PM EST ----- Regarding: iron  Iron saturation low. Needs one dose of iron next week please. Thank you!  Sarah  ----- Message ----- From: Interface, Lab In Three Zero One Sent: 06/19/2016   8:26 AM To: [name], NP

## 2016-06-24 ENCOUNTER — Telehealth: Payer: Self-pay | Admitting: *Deleted

## 2016-06-24 NOTE — Telephone Encounter (Addendum)
Attempted multiple times to reach patient. Neither number will connect. Note made on patient's next appointment. Will have to update phone numbers when he's here next.   Patient will also need to schedule feraheme infusion when here.   ----- Message from [name], NP sent at 06/19/2016  3:04 PM EST ----- Regarding: iron  Iron saturation low. Needs one dose of iron next week please. Thank you!  Sarah  ----- Message ----- From: Interface, Lab In Three Zero One Sent: 06/19/2016   8:26 AM To: [name], NP

## 2016-06-30 DIAGNOSIS — R251 Tremor, unspecified: Secondary | ICD-10-CM | POA: Diagnosis not present

## 2016-06-30 DIAGNOSIS — R079 Chest pain, unspecified: Secondary | ICD-10-CM | POA: Diagnosis not present

## 2016-06-30 DIAGNOSIS — J42 Unspecified chronic bronchitis: Secondary | ICD-10-CM | POA: Diagnosis not present

## 2016-06-30 DIAGNOSIS — I10 Essential (primary) hypertension: Secondary | ICD-10-CM | POA: Diagnosis not present

## 2016-07-02 DIAGNOSIS — J441 Chronic obstructive pulmonary disease with (acute) exacerbation: Secondary | ICD-10-CM | POA: Diagnosis not present

## 2016-07-03 ENCOUNTER — Ambulatory Visit (HOSPITAL_BASED_OUTPATIENT_CLINIC_OR_DEPARTMENT_OTHER): Payer: PPO

## 2016-07-03 VITALS — BP 149/94 | HR 84 | Temp 97.9°F | Resp 16

## 2016-07-03 DIAGNOSIS — E349 Endocrine disorder, unspecified: Secondary | ICD-10-CM

## 2016-07-03 DIAGNOSIS — E291 Testicular hypofunction: Secondary | ICD-10-CM

## 2016-07-03 MED ORDER — TESTOSTERONE CYPIONATE 200 MG/ML IM SOLN
INTRAMUSCULAR | Status: AC
  Filled 2016-07-03: qty 2

## 2016-07-03 MED ORDER — TESTOSTERONE CYPIONATE 200 MG/ML IM SOLN
300.0000 mg | INTRAMUSCULAR | Status: DC
Start: 2016-07-03 — End: 2016-07-03
  Administered 2016-07-03: 300 mg via INTRAMUSCULAR

## 2016-07-03 NOTE — Patient Instructions (Signed)
Testosterone injection What is this medicine? TESTOSTERONE (tes TOS ter one) is the main male hormone. It supports normal male development such as muscle growth, facial hair, and deep voice. It is used in males to treat low testosterone levels. This medicine may be used for other purposes; ask your health care provider or pharmacist if you have questions. COMMON BRAND NAME(S): Andro-L.A., Aveed, Delatestryl, Depo-Testosterone, Virilon What should I tell my health care provider before I take this medicine? They need to know if you have any of these conditions: -cancer -diabetes -heart disease -kidney disease -liver disease -lung disease -prostate disease -an unusual or allergic reaction to testosterone, other medicines, foods, dyes, or preservatives -pregnant or trying to get pregnant -breast-feeding How should I use this medicine? This medicine is for injection into a muscle. It is usually given by a health care professional in a hospital or clinic setting. Contact your pediatrician regarding the use of this medicine in children. While this medicine may be prescribed for children as young as 12 years of age for selected conditions, precautions do apply. Overdosage: If you think you have taken too much of this medicine contact a poison control center or emergency room at once. NOTE: This medicine is only for you. Do not share this medicine with others. What if I miss a dose? Try not to miss a dose. Your doctor or health care professional will tell you when your next injection is due. Notify the office if you are unable to keep an appointment. What may interact with this medicine? -medicines for diabetes -medicines that treat or prevent blood clots like warfarin -oxyphenbutazone -propranolol -steroid medicines like prednisone or cortisone This list may not describe all possible interactions. Give your health care provider a list of all the medicines, herbs, non-prescription drugs, or  dietary supplements you use. Also tell them if you smoke, drink alcohol, or use illegal drugs. Some items may interact with your medicine. What should I watch for while using this medicine? Visit your doctor or health care professional for regular checks on your progress. They will need to check the level of testosterone in your blood. This medicine is only approved for use in men who have low levels of testosterone related to certain medical conditions. Heart attacks and strokes have been reported with the use of this medicine. Notify your doctor or health care professional and seek emergency treatment if you develop breathing problems; changes in vision; confusion; chest pain or chest tightness; sudden arm pain; severe, sudden headache; trouble speaking or understanding; sudden numbness or weakness of the face, arm or leg; loss of balance or coordination. Talk to your doctor about the risks and benefits of this medicine. This medicine may affect blood sugar levels. If you have diabetes, check with your doctor or health care professional before you change your diet or the dose of your diabetic medicine. Testosterone injections are not commonly used in women. Women should inform their doctor if they wish to become pregnant or think they might be pregnant. There is a potential for serious side effects to an unborn child. Talk to your health care professional or pharmacist for more information. Talk with your doctor or health care professional about your birth control options while taking this medicine. This drug is banned from use in athletes by most athletic organizations. What side effects may I notice from receiving this medicine? Side effects that you should report to your doctor or health care professional as soon as possible: -allergic reactions like skin rash,   itching or hives, swelling of the face, lips, or tongue -breast enlargement -breathing problems -changes in emotions or moods -deep or  hoarse voice -irregular menstrual periods -signs and symptoms of liver injury like dark yellow or brown urine; general ill feeling or flu-like symptoms; light-colored stools; loss of appetite; nausea; right upper belly pain; unusually weak or tired; yellowing of the eyes or skin -stomach pain -swelling of the ankles, feet, hands -too frequent or persistent erections -trouble passing urine or change in the amount of urine Side effects that usually do not require medical attention (report to your doctor or health care professional if they continue or are bothersome): -acne -change in sex drive or performance -facial hair growth -hair loss -headache This list may not describe all possible side effects. Call your doctor for medical advice about side effects. You may report side effects to FDA at 1-800-FDA-1088. Where should I keep my medicine? Keep out of the reach of children. This medicine can be abused. Keep your medicine in a safe place to protect it from theft. Do not share this medicine with anyone. Selling or giving away this medicine is dangerous and against the law. Store at room temperature between 20 and 25 degrees C (68 and 77 degrees F). Do not freeze. Protect from light. Follow the directions for the product you are prescribed. Throw away any unused medicine after the expiration date. NOTE: This sheet is a summary. It may not cover all possible information. If you have questions about this medicine, talk to your doctor, pharmacist, or health care provider.  2017 Elsevier/Gold Standard (2015-08-03 07:33:55)  

## 2016-07-07 DIAGNOSIS — J441 Chronic obstructive pulmonary disease with (acute) exacerbation: Secondary | ICD-10-CM | POA: Diagnosis not present

## 2016-07-17 ENCOUNTER — Ambulatory Visit (HOSPITAL_BASED_OUTPATIENT_CLINIC_OR_DEPARTMENT_OTHER): Payer: PPO

## 2016-07-17 VITALS — BP 133/91 | HR 99 | Temp 97.4°F | Resp 16

## 2016-07-17 DIAGNOSIS — E349 Endocrine disorder, unspecified: Secondary | ICD-10-CM

## 2016-07-17 DIAGNOSIS — E291 Testicular hypofunction: Secondary | ICD-10-CM | POA: Diagnosis not present

## 2016-07-17 MED ORDER — TESTOSTERONE CYPIONATE 200 MG/ML IM SOLN
300.0000 mg | INTRAMUSCULAR | Status: DC
  Administered 2016-07-17: 300 mg via INTRAMUSCULAR

## 2016-07-17 MED ORDER — TESTOSTERONE CYPIONATE 200 MG/ML IM SOLN
INTRAMUSCULAR | Status: AC
  Filled 2016-07-17: qty 2

## 2016-07-17 NOTE — Patient Instructions (Signed)
Testosterone injection What is this medicine? TESTOSTERONE (tes TOS ter one) is the main male hormone. It supports normal male development such as muscle growth, facial hair, and deep voice. It is used in males to treat low testosterone levels. This medicine may be used for other purposes; ask your health care provider or pharmacist if you have questions. COMMON BRAND NAME(S): Andro-L.A., Aveed, Delatestryl, Depo-Testosterone, Virilon What should I tell my health care provider before I take this medicine? They need to know if you have any of these conditions: -cancer -diabetes -heart disease -kidney disease -liver disease -lung disease -prostate disease -an unusual or allergic reaction to testosterone, other medicines, foods, dyes, or preservatives -pregnant or trying to get pregnant -breast-feeding How should I use this medicine? This medicine is for injection into a muscle. It is usually given by a health care professional in a hospital or clinic setting. Contact your pediatrician regarding the use of this medicine in children. While this medicine may be prescribed for children as young as 12 years of age for selected conditions, precautions do apply. Overdosage: If you think you have taken too much of this medicine contact a poison control center or emergency room at once. NOTE: This medicine is only for you. Do not share this medicine with others. What if I miss a dose? Try not to miss a dose. Your doctor or health care professional will tell you when your next injection is due. Notify the office if you are unable to keep an appointment. What may interact with this medicine? -medicines for diabetes -medicines that treat or prevent blood clots like warfarin -oxyphenbutazone -propranolol -steroid medicines like prednisone or cortisone This list may not describe all possible interactions. Give your health care provider a list of all the medicines, herbs, non-prescription drugs, or  dietary supplements you use. Also tell them if you smoke, drink alcohol, or use illegal drugs. Some items may interact with your medicine. What should I watch for while using this medicine? Visit your doctor or health care professional for regular checks on your progress. They will need to check the level of testosterone in your blood. This medicine is only approved for use in men who have low levels of testosterone related to certain medical conditions. Heart attacks and strokes have been reported with the use of this medicine. Notify your doctor or health care professional and seek emergency treatment if you develop breathing problems; changes in vision; confusion; chest pain or chest tightness; sudden arm pain; severe, sudden headache; trouble speaking or understanding; sudden numbness or weakness of the face, arm or leg; loss of balance or coordination. Talk to your doctor about the risks and benefits of this medicine. This medicine may affect blood sugar levels. If you have diabetes, check with your doctor or health care professional before you change your diet or the dose of your diabetic medicine. Testosterone injections are not commonly used in women. Women should inform their doctor if they wish to become pregnant or think they might be pregnant. There is a potential for serious side effects to an unborn child. Talk to your health care professional or pharmacist for more information. Talk with your doctor or health care professional about your birth control options while taking this medicine. This drug is banned from use in athletes by most athletic organizations. What side effects may I notice from receiving this medicine? Side effects that you should report to your doctor or health care professional as soon as possible: -allergic reactions like skin rash,   itching or hives, swelling of the face, lips, or tongue -breast enlargement -breathing problems -changes in emotions or moods -deep or  hoarse voice -irregular menstrual periods -signs and symptoms of liver injury like dark yellow or brown urine; general ill feeling or flu-like symptoms; light-colored stools; loss of appetite; nausea; right upper belly pain; unusually weak or tired; yellowing of the eyes or skin -stomach pain -swelling of the ankles, feet, hands -too frequent or persistent erections -trouble passing urine or change in the amount of urine Side effects that usually do not require medical attention (report to your doctor or health care professional if they continue or are bothersome): -acne -change in sex drive or performance -facial hair growth -hair loss -headache This list may not describe all possible side effects. Call your doctor for medical advice about side effects. You may report side effects to FDA at 1-800-FDA-1088. Where should I keep my medicine? Keep out of the reach of children. This medicine can be abused. Keep your medicine in a safe place to protect it from theft. Do not share this medicine with anyone. Selling or giving away this medicine is dangerous and against the law. Store at room temperature between 20 and 25 degrees C (68 and 77 degrees F). Do not freeze. Protect from light. Follow the directions for the product you are prescribed. Throw away any unused medicine after the expiration date. NOTE: This sheet is a summary. It may not cover all possible information. If you have questions about this medicine, talk to your doctor, pharmacist, or health care provider.  2017 Elsevier/Gold Standard (2015-08-03 07:33:55)  

## 2016-07-31 ENCOUNTER — Ambulatory Visit: Payer: Self-pay

## 2016-07-31 ENCOUNTER — Ambulatory Visit (HOSPITAL_BASED_OUTPATIENT_CLINIC_OR_DEPARTMENT_OTHER): Payer: PPO

## 2016-07-31 ENCOUNTER — Ambulatory Visit (HOSPITAL_BASED_OUTPATIENT_CLINIC_OR_DEPARTMENT_OTHER): Payer: PPO | Admitting: Hematology & Oncology

## 2016-07-31 ENCOUNTER — Other Ambulatory Visit (HOSPITAL_BASED_OUTPATIENT_CLINIC_OR_DEPARTMENT_OTHER): Payer: PPO

## 2016-07-31 VITALS — BP 142/102 | HR 99 | Temp 98.2°F | Resp 20 | Wt 211.0 lb

## 2016-07-31 DIAGNOSIS — E271 Primary adrenocortical insufficiency: Secondary | ICD-10-CM | POA: Diagnosis not present

## 2016-07-31 DIAGNOSIS — I5022 Chronic systolic (congestive) heart failure: Secondary | ICD-10-CM

## 2016-07-31 DIAGNOSIS — E349 Endocrine disorder, unspecified: Secondary | ICD-10-CM

## 2016-07-31 DIAGNOSIS — D509 Iron deficiency anemia, unspecified: Secondary | ICD-10-CM

## 2016-07-31 DIAGNOSIS — D508 Other iron deficiency anemias: Secondary | ICD-10-CM

## 2016-07-31 DIAGNOSIS — E291 Testicular hypofunction: Secondary | ICD-10-CM | POA: Diagnosis not present

## 2016-07-31 DIAGNOSIS — K769 Liver disease, unspecified: Secondary | ICD-10-CM | POA: Diagnosis not present

## 2016-07-31 LAB — CBC WITH DIFFERENTIAL (CANCER CENTER ONLY)
BASO#: 0 10*3/uL (ref 0.0–0.2)
BASO%: 0.6 % (ref 0.0–2.0)
EOS%: 3.8 % (ref 0.0–7.0)
Eosinophils Absolute: 0.3 10*3/uL (ref 0.0–0.5)
HCT: 48.8 % (ref 38.7–49.9)
HEMOGLOBIN: 16.2 g/dL (ref 13.0–17.1)
LYMPH#: 2.8 10*3/uL (ref 0.9–3.3)
LYMPH%: 40.4 % (ref 14.0–48.0)
MCH: 28.8 pg (ref 28.0–33.4)
MCHC: 33.2 g/dL (ref 32.0–35.9)
MCV: 87 fL (ref 82–98)
MONO#: 0.5 10*3/uL (ref 0.1–0.9)
MONO%: 6.6 % (ref 0.0–13.0)
NEUT%: 48.6 % (ref 40.0–80.0)
NEUTROS ABS: 3.3 10*3/uL (ref 1.5–6.5)
PLATELETS: 286 10*3/uL (ref 145–400)
RBC: 5.62 10*6/uL (ref 4.20–5.70)
RDW: 14.3 % (ref 11.1–15.7)
WBC: 6.8 10*3/uL (ref 4.0–10.0)

## 2016-07-31 LAB — CMP (CANCER CENTER ONLY)
ALBUMIN: 3.6 g/dL (ref 3.3–5.5)
ALT(SGPT): 26 U/L (ref 10–47)
AST: 22 U/L (ref 11–38)
Alkaline Phosphatase: 106 U/L — ABNORMAL HIGH (ref 26–84)
BILIRUBIN TOTAL: 0.7 mg/dL (ref 0.20–1.60)
BUN: 8 mg/dL (ref 7–22)
CO2: 30 meq/L (ref 18–33)
CREATININE: 1.1 mg/dL (ref 0.6–1.2)
Calcium: 9.4 mg/dL (ref 8.0–10.3)
Chloride: 102 mEq/L (ref 98–108)
Glucose, Bld: 128 mg/dL — ABNORMAL HIGH (ref 73–118)
Potassium: 3.1 mEq/L — ABNORMAL LOW (ref 3.3–4.7)
SODIUM: 139 meq/L (ref 128–145)
TOTAL PROTEIN: 7.3 g/dL (ref 6.4–8.1)

## 2016-07-31 LAB — IRON AND TIBC
%SAT: 20 % (ref 20–55)
IRON: 56 ug/dL (ref 42–163)
TIBC: 279 ug/dL (ref 202–409)
UIBC: 223 ug/dL (ref 117–376)

## 2016-07-31 LAB — FERRITIN: Ferritin: 252 ng/ml (ref 22–316)

## 2016-07-31 MED ORDER — TESTOSTERONE CYPIONATE 200 MG/ML IM SOLN
INTRAMUSCULAR | Status: AC
  Filled 2016-07-31: qty 2

## 2016-07-31 MED ORDER — TESTOSTERONE CYPIONATE 200 MG/ML IM SOLN
300.0000 mg | INTRAMUSCULAR | Status: DC
  Administered 2016-07-31: 300 mg via INTRAMUSCULAR

## 2016-07-31 NOTE — Patient Instructions (Signed)
Testosterone injection What is this medicine? TESTOSTERONE (tes TOS ter one) is the main male hormone. It supports normal male development such as muscle growth, facial hair, and deep voice. It is used in males to treat low testosterone levels. This medicine may be used for other purposes; ask your health care provider or pharmacist if you have questions. COMMON BRAND NAME(S): Andro-L.A., Aveed, Delatestryl, Depo-Testosterone, Virilon What should I tell my health care provider before I take this medicine? They need to know if you have any of these conditions: -cancer -diabetes -heart disease -kidney disease -liver disease -lung disease -prostate disease -an unusual or allergic reaction to testosterone, other medicines, foods, dyes, or preservatives -pregnant or trying to get pregnant -breast-feeding How should I use this medicine? This medicine is for injection into a muscle. It is usually given by a health care professional in a hospital or clinic setting. Contact your pediatrician regarding the use of this medicine in children. While this medicine may be prescribed for children as young as 12 years of age for selected conditions, precautions do apply. Overdosage: If you think you have taken too much of this medicine contact a poison control center or emergency room at once. NOTE: This medicine is only for you. Do not share this medicine with others. What if I miss a dose? Try not to miss a dose. Your doctor or health care professional will tell you when your next injection is due. Notify the office if you are unable to keep an appointment. What may interact with this medicine? -medicines for diabetes -medicines that treat or prevent blood clots like warfarin -oxyphenbutazone -propranolol -steroid medicines like prednisone or cortisone This list may not describe all possible interactions. Give your health care provider a list of all the medicines, herbs, non-prescription drugs, or  dietary supplements you use. Also tell them if you smoke, drink alcohol, or use illegal drugs. Some items may interact with your medicine. What should I watch for while using this medicine? Visit your doctor or health care professional for regular checks on your progress. They will need to check the level of testosterone in your blood. This medicine is only approved for use in men who have low levels of testosterone related to certain medical conditions. Heart attacks and strokes have been reported with the use of this medicine. Notify your doctor or health care professional and seek emergency treatment if you develop breathing problems; changes in vision; confusion; chest pain or chest tightness; sudden arm pain; severe, sudden headache; trouble speaking or understanding; sudden numbness or weakness of the face, arm or leg; loss of balance or coordination. Talk to your doctor about the risks and benefits of this medicine. This medicine may affect blood sugar levels. If you have diabetes, check with your doctor or health care professional before you change your diet or the dose of your diabetic medicine. Testosterone injections are not commonly used in women. Women should inform their doctor if they wish to become pregnant or think they might be pregnant. There is a potential for serious side effects to an unborn child. Talk to your health care professional or pharmacist for more information. Talk with your doctor or health care professional about your birth control options while taking this medicine. This drug is banned from use in athletes by most athletic organizations. What side effects may I notice from receiving this medicine? Side effects that you should report to your doctor or health care professional as soon as possible: -allergic reactions like skin rash,   itching or hives, swelling of the face, lips, or tongue -breast enlargement -breathing problems -changes in emotions or moods -deep or  hoarse voice -irregular menstrual periods -signs and symptoms of liver injury like dark yellow or brown urine; general ill feeling or flu-like symptoms; light-colored stools; loss of appetite; nausea; right upper belly pain; unusually weak or tired; yellowing of the eyes or skin -stomach pain -swelling of the ankles, feet, hands -too frequent or persistent erections -trouble passing urine or change in the amount of urine Side effects that usually do not require medical attention (report to your doctor or health care professional if they continue or are bothersome): -acne -change in sex drive or performance -facial hair growth -hair loss -headache This list may not describe all possible side effects. Call your doctor for medical advice about side effects. You may report side effects to FDA at 1-800-FDA-1088. Where should I keep my medicine? Keep out of the reach of children. This medicine can be abused. Keep your medicine in a safe place to protect it from theft. Do not share this medicine with anyone. Selling or giving away this medicine is dangerous and against the law. Store at room temperature between 20 and 25 degrees C (68 and 77 degrees F). Do not freeze. Protect from light. Follow the directions for the product you are prescribed. Throw away any unused medicine after the expiration date. NOTE: This sheet is a summary. It may not cover all possible information. If you have questions about this medicine, talk to your doctor, pharmacist, or health care provider.  2017 Elsevier/Gold Standard (2015-08-03 07:33:55)  

## 2016-07-31 NOTE — Progress Notes (Signed)
Hematology and Oncology Follow Up Visit  James Zuniga [id number] 21-Sep-1955 61 y.o. 07/31/2016   Principle Diagnosis:   Iron deficiency anemia  hypotestosteronemia  Chronic liver lesions  Current Therapy:    IV iron as indicated  Depo- testosterone 351m IM Q2 weeks     Interim History:  James Zuniga back for followup. He actually looks fairly good. He is doing pulmonary physical therapy. This is helping him. He has a little bit more stamina. Sounds like he will have a sleep study. He probably has some sleep apnea.  He is gaining weight. He is no longer smoking. I this is definitely a good thing for him as he does have some underlying lung disease. Hopefully, he will be able to exercise a little more. He has bad knees., Surety to have anything done with this.  He definitely is not anemic. I think the fact that he gets his testosterone is the likely reason for this. We have not given him any iron probably for a couple years. His ferritin back in December was 259. His iron saturation was 16%.  His knees do bother him. He still not yet seen an orthopedic surgeon.  He is not smoking. He has not smoked for a couple months. Hopefully, he will be able to quit.   His testosterone levels have been adequate. Back in December, his testosterone level was 550. His quality of life is much better with the testosterone.  He has bad arthritis. I'm sure what can be done. I don't think he really is too interested in seeing orthopedic surgery.  Overall, his performance status is ECOG 2.  Medications:  Current Outpatient Prescriptions:  .  albuterol (PROVENTIL HFA;VENTOLIN HFA) 108 (90 Base) MCG/ACT inhaler, Inhale 1-2 puffs into the lungs every 6 (six) hours as needed for wheezing., Disp: 1 Inhaler, Rfl: 0 .  allopurinol (ZYLOPRIM) 300 MG tablet, Take 300 mg by mouth daily., Disp: , Rfl:  .  amLODipine (NORVASC) 10 MG tablet, 10 mg daily., Disp: , Rfl:  .  aspirin 81 MG EC tablet, Take 81 mg  by mouth daily. Swallow whole., Disp: , Rfl:  .  atorvastatin (LIPITOR) 10 MG tablet, TAKE 1 TABLET BY MOUTH DAILY, Disp: , Rfl:  .  B-D ULTRA-FINE 33 LANCETS MISC, by Does not apply route., Disp: , Rfl:  .  Blood Glucose Monitoring Suppl (FIFTY50 GLUCOSE METER 2.0) w/Device KIT, Use as instructed, Disp: , Rfl:  .  BREO ELLIPTA 100-25 MCG/INH AEPB, INL 1 PUFF PO D, Disp: 1 each, Rfl: 5 .  Cholecalciferol (VITAMIN D3) 1000 UNITS CAPS, Take by mouth., Disp: , Rfl:  .  diclofenac sodium (VOLTAREN) 1 % GEL, Apply topically., Disp: , Rfl:  .  DULoxetine (CYMBALTA) 60 MG capsule, Take one daily, Disp: , Rfl:  .  furosemide (LASIX) 20 MG tablet, Take 1 tablet by mouth 2 (two) times daily., Disp: , Rfl:  .  gabapentin (NEURONTIN) 300 MG capsule, TAKE 1 CAPSULE BY MOUTH THREE TIMES DAILY, Disp: , Rfl:  .  ibuprofen (ADVIL,MOTRIN) 600 MG tablet, 600 mg., Disp: , Rfl: 2 .  losartan (COZAAR) 50 MG tablet, Take 50 mg by mouth daily. , Disp: , Rfl:  .  metoprolol tartrate (LOPRESSOR) 25 MG tablet, Take 25 mg by mouth daily. , Disp: , Rfl:  .  mupirocin cream (BACTROBAN) 2 %, Apply to affected area 3 times daily, Disp: , Rfl:  .  omeprazole (PRILOSEC) 20 MG capsule, Take 20 mg by mouth  daily. , Disp: , Rfl: 0 .  ONE TOUCH ULTRA TEST test strip, 2 (two) times daily. for testing, Disp: , Rfl: 11 .  potassium chloride SA (K-DUR,KLOR-CON) 20 MEQ tablet, Take 1 tablet (20 mEq total) by mouth 2 (two) times daily., Disp: 30 tablet, Rfl: 3 .  primidone (MYSOLINE) 50 MG tablet, Take 25 mg by mouth., Disp: , Rfl:  .  tamsulosin (FLOMAX) 0.4 MG CAPS capsule, Take 1 capsule (0.4 mg total) by mouth daily after breakfast., Disp: 30 capsule, Rfl: 12 .  testosterone cypionate (DEPOTESTOSTERONE CYPIONATE) 200 MG/ML injection, Inject into the muscle., Disp: , Rfl:  .  tiotropium (SPIRIVA) 18 MCG inhalation capsule, Place 1 capsule (18 mcg total) into inhaler and inhale daily., Disp: 30 capsule, Rfl: 6 .  tiZANidine (ZANAFLEX) 4  MG tablet, 4 mg 3 (three) times daily as needed., Disp: , Rfl: 0 .  traMADol (ULTRAM) 50 MG tablet, Take 50 mg by mouth daily as needed for moderate pain. , Disp: , Rfl:  .  zolpidem (AMBIEN) 10 MG tablet, 10 mg at bedtime as needed., Disp: , Rfl:  No current facility-administered medications for this visit.   Facility-Administered Medications Ordered in Other Visits:  .  testosterone cypionate (DEPOTESTOSTERONE CYPIONATE) injection 300 mg, 300 mg, Intramuscular, Q14 Days, [name], NP  Allergies: No Known Allergies  Past Medical History, Surgical history, Social history, and Family History were reviewed and updated.  Review of Systems: As above  Physical Exam:  weight is 211 lb (95.7 kg). His oral temperature is 98.2 F (36.8 C). His blood pressure is 142/102 (abnormal) and his pulse is 99. His respiration is 20.   Well-developed and well-nourished African American gentleman. Head and neck exam shows no ocular or oral lesions. He does have a bit of firmness and slight tenderness in about a centimeter area just to the left of the angle of his mouth. This is where he has this abscess. He has no adenopathy. Lungs are clear. Cardiac exam is regular rate and rhythm. There are no murmurs, rubs or bruits. Abdomen is soft. Has good bowel sounds. There is no fluid wave. There is no palpable liver or spleen tip. Back exam shows no tenderness over the spine ribs or hips. Extremities shows no clubbing, cyanosis or edema. He has a knee brace on the left knee. He has some slight swelling in the left leg. Skin exam shows no rashes, ecchymoses or petechia. Neurological exam is nonfocal.  Lab Results  Component Value Date   WBC 6.8 07/31/2016   HGB 16.2 07/31/2016   HCT 48.8 07/31/2016   MCV 87 07/31/2016   PLT 286 07/31/2016     Chemistry      Component Value Date/Time   NA 139 07/31/2016 0815   NA 141 06/19/2016 0803   K 3.1 (L) 07/31/2016 0815   K 3.3 (L) 06/19/2016 0803   CL 102  07/31/2016 0815   CO2 30 07/31/2016 0815   CO2 28 06/19/2016 0803   BUN 8 07/31/2016 0815   BUN 11.1 06/19/2016 0803   CREATININE 1.1 07/31/2016 0815   CREATININE 1.3 06/19/2016 0803      Component Value Date/Time   CALCIUM 9.4 07/31/2016 0815   CALCIUM 9.5 06/19/2016 0803   ALKPHOS 106 (H) 07/31/2016 0815   ALKPHOS 114 06/19/2016 0803   AST 22 07/31/2016 0815   AST 25 06/19/2016 0803   ALT 26 07/31/2016 0815   ALT 36 06/19/2016 0803   BILITOT 0.70 07/31/2016 0815  BILITOT 0.33 06/19/2016 0803         Impression and Plan: James Zuniga is 61 year old African-American male. He has multifactorial anemia. We have taken care of this nicely. He gets IV iron on occasion although we have not had to give him any for many months.  I'm sure that the fact that he uses testosterone also is helping his anemia.  He says that he cannot donate to the [facility] because they will not take his blood because of his other health issues.  We will get him back in 6 weeks to see me. He comes back every 2 weeks for his testosterone booster. If, in 6 weeks, his testosterone level is still okay, then we can make his appointments every 3 weeks for testosterone.  Nile Riggs, MD 1/19/20189:00 AM

## 2016-08-01 LAB — TESTOSTERONE: TESTOSTERONE: 681 ng/dL (ref 264–916)

## 2016-08-02 DIAGNOSIS — J441 Chronic obstructive pulmonary disease with (acute) exacerbation: Secondary | ICD-10-CM | POA: Diagnosis not present

## 2016-08-03 DIAGNOSIS — E785 Hyperlipidemia, unspecified: Secondary | ICD-10-CM | POA: Diagnosis not present

## 2016-08-03 DIAGNOSIS — R079 Chest pain, unspecified: Secondary | ICD-10-CM | POA: Diagnosis not present

## 2016-08-03 DIAGNOSIS — I10 Essential (primary) hypertension: Secondary | ICD-10-CM | POA: Diagnosis not present

## 2016-08-03 DIAGNOSIS — J9610 Chronic respiratory failure, unspecified whether with hypoxia or hypercapnia: Secondary | ICD-10-CM | POA: Diagnosis not present

## 2016-08-03 DIAGNOSIS — I428 Other cardiomyopathies: Secondary | ICD-10-CM | POA: Diagnosis not present

## 2016-08-07 DIAGNOSIS — J42 Unspecified chronic bronchitis: Secondary | ICD-10-CM | POA: Diagnosis not present

## 2016-08-07 DIAGNOSIS — R251 Tremor, unspecified: Secondary | ICD-10-CM | POA: Diagnosis not present

## 2016-08-07 DIAGNOSIS — J441 Chronic obstructive pulmonary disease with (acute) exacerbation: Secondary | ICD-10-CM | POA: Diagnosis not present

## 2016-08-07 DIAGNOSIS — I10 Essential (primary) hypertension: Secondary | ICD-10-CM | POA: Diagnosis not present

## 2016-08-07 DIAGNOSIS — N529 Male erectile dysfunction, unspecified: Secondary | ICD-10-CM | POA: Diagnosis not present

## 2016-08-14 ENCOUNTER — Ambulatory Visit (HOSPITAL_BASED_OUTPATIENT_CLINIC_OR_DEPARTMENT_OTHER): Payer: PPO

## 2016-08-14 VITALS — BP 154/96 | HR 100 | Temp 98.2°F | Resp 18

## 2016-08-14 DIAGNOSIS — E291 Testicular hypofunction: Secondary | ICD-10-CM | POA: Diagnosis not present

## 2016-08-14 DIAGNOSIS — E349 Endocrine disorder, unspecified: Secondary | ICD-10-CM

## 2016-08-14 MED ORDER — TESTOSTERONE CYPIONATE 200 MG/ML IM SOLN
300.0000 mg | INTRAMUSCULAR | Status: DC
  Administered 2016-08-14: 300 mg via INTRAMUSCULAR

## 2016-08-14 MED ORDER — TESTOSTERONE CYPIONATE 200 MG/ML IM SOLN
INTRAMUSCULAR | Status: AC
  Filled 2016-08-14: qty 2

## 2016-08-14 NOTE — Patient Instructions (Signed)
Testosterone injection What is this medicine? TESTOSTERONE (tes TOS ter one) is the main male hormone. It supports normal male development such as muscle growth, facial hair, and deep voice. It is used in males to treat low testosterone levels. This medicine may be used for other purposes; ask your health care provider or pharmacist if you have questions. COMMON BRAND NAME(S): Andro-L.A., Aveed, Delatestryl, Depo-Testosterone, Virilon What should I tell my health care provider before I take this medicine? They need to know if you have any of these conditions: -cancer -diabetes -heart disease -kidney disease -liver disease -lung disease -prostate disease -an unusual or allergic reaction to testosterone, other medicines, foods, dyes, or preservatives -pregnant or trying to get pregnant -breast-feeding How should I use this medicine? This medicine is for injection into a muscle. It is usually given by a health care professional in a hospital or clinic setting. Contact your pediatrician regarding the use of this medicine in children. While this medicine may be prescribed for children as young as 12 years of age for selected conditions, precautions do apply. Overdosage: If you think you have taken too much of this medicine contact a poison control center or emergency room at once. NOTE: This medicine is only for you. Do not share this medicine with others. What if I miss a dose? Try not to miss a dose. Your doctor or health care professional will tell you when your next injection is due. Notify the office if you are unable to keep an appointment. What may interact with this medicine? -medicines for diabetes -medicines that treat or prevent blood clots like warfarin -oxyphenbutazone -propranolol -steroid medicines like prednisone or cortisone This list may not describe all possible interactions. Give your health care provider a list of all the medicines, herbs, non-prescription drugs, or  dietary supplements you use. Also tell them if you smoke, drink alcohol, or use illegal drugs. Some items may interact with your medicine. What should I watch for while using this medicine? Visit your doctor or health care professional for regular checks on your progress. They will need to check the level of testosterone in your blood. This medicine is only approved for use in men who have low levels of testosterone related to certain medical conditions. Heart attacks and strokes have been reported with the use of this medicine. Notify your doctor or health care professional and seek emergency treatment if you develop breathing problems; changes in vision; confusion; chest pain or chest tightness; sudden arm pain; severe, sudden headache; trouble speaking or understanding; sudden numbness or weakness of the face, arm or leg; loss of balance or coordination. Talk to your doctor about the risks and benefits of this medicine. This medicine may affect blood sugar levels. If you have diabetes, check with your doctor or health care professional before you change your diet or the dose of your diabetic medicine. Testosterone injections are not commonly used in women. Women should inform their doctor if they wish to become pregnant or think they might be pregnant. There is a potential for serious side effects to an unborn child. Talk to your health care professional or pharmacist for more information. Talk with your doctor or health care professional about your birth control options while taking this medicine. This drug is banned from use in athletes by most athletic organizations. What side effects may I notice from receiving this medicine? Side effects that you should report to your doctor or health care professional as soon as possible: -allergic reactions like skin rash,   itching or hives, swelling of the face, lips, or tongue -breast enlargement -breathing problems -changes in emotions or moods -deep or  hoarse voice -irregular menstrual periods -signs and symptoms of liver injury like dark yellow or brown urine; general ill feeling or flu-like symptoms; light-colored stools; loss of appetite; nausea; right upper belly pain; unusually weak or tired; yellowing of the eyes or skin -stomach pain -swelling of the ankles, feet, hands -too frequent or persistent erections -trouble passing urine or change in the amount of urine Side effects that usually do not require medical attention (report to your doctor or health care professional if they continue or are bothersome): -acne -change in sex drive or performance -facial hair growth -hair loss -headache This list may not describe all possible side effects. Call your doctor for medical advice about side effects. You may report side effects to FDA at 1-800-FDA-1088. Where should I keep my medicine? Keep out of the reach of children. This medicine can be abused. Keep your medicine in a safe place to protect it from theft. Do not share this medicine with anyone. Selling or giving away this medicine is dangerous and against the law. Store at room temperature between 20 and 25 degrees C (68 and 77 degrees F). Do not freeze. Protect from light. Follow the directions for the product you are prescribed. Throw away any unused medicine after the expiration date. NOTE: This sheet is a summary. It may not cover all possible information. If you have questions about this medicine, talk to your doctor, pharmacist, or health care provider.  2017 Elsevier/Gold Standard (2015-08-03 07:33:55)  

## 2016-08-17 ENCOUNTER — Encounter: Payer: Self-pay | Admitting: Internal Medicine

## 2016-08-17 ENCOUNTER — Ambulatory Visit (INDEPENDENT_AMBULATORY_CARE_PROVIDER_SITE_OTHER): Payer: PPO | Admitting: Internal Medicine

## 2016-08-17 VITALS — BP 122/88 | HR 79

## 2016-08-17 DIAGNOSIS — R911 Solitary pulmonary nodule: Secondary | ICD-10-CM | POA: Diagnosis not present

## 2016-08-17 DIAGNOSIS — E349 Endocrine disorder, unspecified: Secondary | ICD-10-CM | POA: Diagnosis not present

## 2016-08-17 DIAGNOSIS — J449 Chronic obstructive pulmonary disease, unspecified: Secondary | ICD-10-CM

## 2016-08-17 DIAGNOSIS — IMO0001 Reserved for inherently not codable concepts without codable children: Secondary | ICD-10-CM

## 2016-08-17 DIAGNOSIS — F17200 Nicotine dependence, unspecified, uncomplicated: Secondary | ICD-10-CM | POA: Diagnosis not present

## 2016-08-17 MED ORDER — BREO ELLIPTA 100-25 MCG/INH IN AEPB: INHALATION_SPRAY | RESPIRATORY_TRACT | 0 refills | Status: DC

## 2016-08-17 MED ORDER — UMECLIDINIUM BROMIDE 62.5 MCG/INH IN AEPB: 1.0000 | INHALATION_SPRAY | Freq: Every day | RESPIRATORY_TRACT | 0 refills | Status: DC

## 2016-08-17 NOTE — Patient Instructions (Addendum)
ICD-9-CM ICD-10-CM    #Copd  - COPD appears stable. Continue inhaler therapy - take spiriva (or tudorza or incruse) and breo (or advair) samples - use o2 at night  #Lung nodule -This is stable between August 2014 and January 2017 - No active follow-up of the nodule -- Will discuss lung cancer screening at follow-up  #Smoking - Glad you quit 11 months ago   #Follow-up  - 6- months or sooenr if needed

## 2016-08-17 NOTE — Addendum Note (Signed)
Addended by: Jannette Spanner on: 08/17/2016 11:05 AM   Modules accepted: Orders

## 2016-08-17 NOTE — Progress Notes (Signed)
Subjective:     Patient ID: James Zuniga, male   DOB: Nov 30, 1955, 61 y.o.   MRN: [id number]  HPI  OV 11/12/2014  Chief Complaint  Patient presents with  . Follow-up    Pt states his breathing has worsened since last OV. pt c/o increase in SOB, dry cough and CP with activity.     COPD  (no nocturnal desat sept 2015) :   No interim flare up. LAst seen Sept 2015. Using spriiva and symbicort and a compliant fashion. However, he is reporting worsening shortness of breath in the last 4 months. It is moderate to severe in intensity. It is class III in severity. Relieved by rest. No associated chest pain or wheezing or cough. He has mild occasional sputum that is brown in color but no change. He is not reporting any other symptoms of COPD exacerbation. He continues to live a sedentary lifestyle. Of note he has been on testosterone for the last several months to a year. Most recent hemoglobin was 16 g percent [lab test personally reviewed). There is no associated syncope. Oxygen desaturation test in September 2015 was normal. We have advised him there was no need for oxygen use. He is really frustrated by his worsening dyspnea. He wants to change his inhalers around. Spiriva and Symbicort are helping but he wants to try better one. Reportedly normal cardiac workup with [place] cardiologist  Smoking:   reports that he quit smoking about 4 months ago. His smoking use included Cigarettes. He started smoking about 31 years ago. He has a 30 pack-year smoking history. He has never used smokeless tobacco.   Lung nodule 46m - stable aug 2014 and 2015. Next is September 2016   OV 10/29/2015  Chief Complaint  Patient presents with  . Follow-up    Pt went to ED in mid March for COPD exacerbation. Pt states his SOB is not back to baseline and chest tightness when SOB. Pt denies cough.     COPD: COPD cat score is 26. Overall he feels that he is stable. I last saw him almost one year ago. In the interim he saw  my nurse practitioner. A year ago changed his inhalers around but it didn't really did not help his dyspnea. However he admits that his dyspnea is unchanged in the last few years. But he does feel miserable. He somewhat reluctantly agree to attend pulmonary rehabilitation because of his need to attend Dr. visits. In the inferior agreed to go and get evaluated. He might have difficulty attending pulmonary rehabilitation because of his antalgic gait and using cane. He also wants to change his nocturnal oxygen from a nasal cannula to facemask. Review of the records suggest that he might not be desaturating at night but he is reluctant to give up his oxygen.  Lung nodule: 4 mm right upper lobe. Was originally seen in August 2014. Stable as of January 2017  Smoking: He says he quit smoking 1 month ago.   OV 08/17/2016  Chief Complaint  Patient presents with  . Follow-up    COPD, he states his medications were getting too expensive for him, he does take Spiriva, uses proair more frequently, he states he is always tired and out of breath    Follow-up COPD not otherwise specified. He has a high symptom burden. He is not able to afford his inhalers. Most recently he could not afford Brio. Therefore he is using sample Spiriva from his primary care physician. He continues to  be miserable with class III dyspnea on exertion. He says he is a bad that balance with the oxygen company and therefore they were not given the facemask. He currently uses oxygen at night.  4 mm right upper lobe lung nodule which is in August 2014 and stable in January 2017. Subsequent chest x-ray March 2017 is clear    Smoking: Under remission for 11 months CAT COPD Symptom & Quality of Life Score ([place]) 0 is no burden. 5 is highest burden 10/29/2015   Never Cough -> Cough all the time 3  No phlegm in chest -> Chest is full of phlegm 2  No chest tightness -> Chest feels very tight 3  No dyspnea for 1 flight stairs/[place] ->  Very dyspneic for 1 flight of stairs 4  No limitations for ADL at home -> Very limited with ADL at home 3  Confident leaving home -> Not at all confident leaving home 3  Sleep soundly -> Do not sleep soundly because of lung condition 4  Lots of Energy -> No energy at all 4  TOTAL Score (max 40)  26      has a past medical history of CHF (congestive heart failure) ([place]); Chronic airway obstruction ([place]); Chronic pain; Coronary artery disease; Diabetes mellitus; Disorder of liver; Esophageal reflux; Gout; Hyperlipidemia; Hypertension; Hypokalemia; Hypotestosteronism (07/01/2013); Insomnia; Iron deficiency anemia, unspecified (07/01/2013); Memory loss; PVD (peripheral vascular disease) ([place]); Renal disorder; and Vitamin D deficiency.   reports that he quit smoking about 11 months ago. His smoking use included Cigarettes. He started smoking about 33 years ago. He has a 30.00 pack-year smoking history. He has never used smokeless tobacco.  Past Surgical History:  Procedure Laterality Date  . NO PAST SURGERIES      No Known Allergies  Immunization History  Administered Date(s) Administered  . Influenza Split 04/12/2012, 05/13/2013  . Influenza,inj,Quad PF,36+ Mos 04/02/2014, 04/15/2015, 05/22/2016    Family History  Problem Relation Age of Onset  . Heart disease Mother   . Diabetes Father   . Stroke Brother      Current Outpatient Prescriptions:  .  albuterol (PROVENTIL HFA;VENTOLIN HFA) 108 (90 Base) MCG/ACT inhaler, Inhale 1-2 puffs into the lungs every 6 (six) hours as needed for wheezing., Disp: 1 Inhaler, Rfl: 0 .  allopurinol (ZYLOPRIM) 300 MG tablet, Take 300 mg by mouth daily., Disp: , Rfl:  .  amLODipine (NORVASC) 10 MG tablet, 10 mg daily., Disp: , Rfl:  .  aspirin 81 MG EC tablet, Take 81 mg by mouth daily. Swallow whole., Disp: , Rfl:  .  atorvastatin (LIPITOR) 10 MG tablet, TAKE 1 TABLET BY MOUTH DAILY, Disp: , Rfl:  .  B-D ULTRA-FINE 33 LANCETS MISC, by Does not apply  route., Disp: , Rfl:  .  Blood Glucose Monitoring Suppl (FIFTY50 GLUCOSE METER 2.0) w/Device KIT, Use as instructed, Disp: , Rfl:  .  BREO ELLIPTA 100-25 MCG/INH AEPB, INL 1 PUFF PO D, Disp: 1 each, Rfl: 5 .  Cholecalciferol (VITAMIN D3) 1000 UNITS CAPS, Take by mouth., Disp: , Rfl:  .  diclofenac sodium (VOLTAREN) 1 % GEL, Apply topically., Disp: , Rfl:  .  DULoxetine (CYMBALTA) 60 MG capsule, Take one daily, Disp: , Rfl:  .  furosemide (LASIX) 20 MG tablet, Take 1 tablet by mouth 2 (two) times daily., Disp: , Rfl:  .  gabapentin (NEURONTIN) 300 MG capsule, TAKE 1 CAPSULE BY MOUTH THREE TIMES DAILY, Disp: , Rfl:  .  ibuprofen (ADVIL,MOTRIN) 600  MG tablet, 600 mg., Disp: , Rfl: 2 .  losartan (COZAAR) 50 MG tablet, Take 50 mg by mouth daily. , Disp: , Rfl:  .  metoprolol tartrate (LOPRESSOR) 25 MG tablet, Take 25 mg by mouth daily. , Disp: , Rfl:  .  mupirocin cream (BACTROBAN) 2 %, Apply to affected area 3 times daily, Disp: , Rfl:  .  omeprazole (PRILOSEC) 20 MG capsule, Take 20 mg by mouth daily. , Disp: , Rfl: 0 .  ONE TOUCH ULTRA TEST test strip, 2 (two) times daily. for testing, Disp: , Rfl: 11 .  potassium chloride SA (K-DUR,KLOR-CON) 20 MEQ tablet, Take 1 tablet (20 mEq total) by mouth 2 (two) times daily., Disp: 30 tablet, Rfl: 3 .  primidone (MYSOLINE) 50 MG tablet, Take 25 mg by mouth., Disp: , Rfl:  .  tamsulosin (FLOMAX) 0.4 MG CAPS capsule, Take 1 capsule (0.4 mg total) by mouth daily after breakfast., Disp: 30 capsule, Rfl: 12 .  testosterone cypionate (DEPOTESTOSTERONE CYPIONATE) 200 MG/ML injection, Inject into the muscle., Disp: , Rfl:  .  tiotropium (SPIRIVA) 18 MCG inhalation capsule, Place 1 capsule (18 mcg total) into inhaler and inhale daily., Disp: 30 capsule, Rfl: 6 .  tiZANidine (ZANAFLEX) 4 MG tablet, 4 mg 3 (three) times daily as needed., Disp: , Rfl: 0 .  traMADol (ULTRAM) 50 MG tablet, Take 50 mg by mouth daily as needed for moderate pain. , Disp: , Rfl:  .  zolpidem  (AMBIEN) 10 MG tablet, 10 mg at bedtime as needed., Disp: , Rfl:   Immunization History  Administered Date(s) Administered  . Influenza Split 04/12/2012, 05/13/2013  . Influenza,inj,Quad PF,36+ Mos 04/02/2014, 04/15/2015, 05/22/2016     Review of Systems     Objective:   Physical Exam  Constitutional: He is oriented to person, place, and time. He appears well-developed and well-nourished. No distress.  HENT:  Head: Normocephalic and atraumatic.  Right Ear: External ear normal.  Left Ear: External ear normal.  Mouth/Throat: Oropharynx is clear and moist. No oropharyngeal exudate.  Eyes: Conjunctivae and EOM are normal. Pupils are equal, round, and reactive to light. Right eye exhibits no discharge. Left eye exhibits no discharge. No scleral icterus.  Neck: Normal range of motion. Neck supple. No JVD present. No tracheal deviation present. No thyromegaly present.  Cardiovascular: Normal rate, regular rhythm and intact distal pulses.  Exam reveals no gallop and no friction rub.   No murmur heard. Pulmonary/Chest: Effort normal and breath sounds normal. No respiratory distress. He has no wheezes. He has no rales. He exhibits no tenderness.  Mild forced upper airway wheeze, inconsistent  Abdominal: Soft. Bowel sounds are normal. He exhibits no distension and no mass. There is no tenderness. There is no rebound and no guarding.  Musculoskeletal: He exhibits no edema or tenderness.  Uses cane Antalgic gait  Lymphadenopathy:    He has no cervical adenopathy.  Neurological: He is alert and oriented to person, place, and time. He has normal reflexes. No cranial nerve deficit. Coordination normal.  Skin: Skin is warm and dry. No rash noted. He is not diaphoretic. No erythema. No pallor.  Psychiatric: He has a normal mood and affect. His behavior is normal. Judgment and thought content normal.  Nursing note and vitals reviewed.  Vitals:   08/17/16 1013  BP: 122/88  Pulse: 79  SpO2: 95%     Estimated body mass index is 34.06 kg/m as calculated from the following:   Height as of 12/06/15: '5\' 6"'  (1.676  m).   Weight as of 07/31/16: 211 lb (95.7 kg).     Assessment:       ICD-9-CM ICD-10-CM   1. Chronic obstructive pulmonary disease, unspecified COPD type ([place]) 496 J44.9   2. Lung nodule 793.11 R91.1   3. Smoking 305.1 F17.200        Plan:      #Copd  - COPD appears stable. Continue inhaler therapy - take spiriva (or tudorza or incruse) and breo (or advair) samples - use o2 at night  #Lung nodule -This is stable between August 2014 and January 2017 - No active follow-up of the nodule -- Will discuss lung cancer screening at follow-up  #Smoking - Glad you quit 11 months ago   #Follow-up  - 6- months or sooenr if needed   Dr. Brand Males, M.D., [facility].C.P Pulmonary and Critical Care Medicine Staff Physician [facility] Pager: [phone], If no answer or between  15:00h - 7:00h: call [phone]  08/17/2016 10:27 AM

## 2016-08-27 ENCOUNTER — Ambulatory Visit (HOSPITAL_BASED_OUTPATIENT_CLINIC_OR_DEPARTMENT_OTHER): Payer: PPO

## 2016-08-27 VITALS — BP 118/81 | HR 71 | Temp 98.4°F | Resp 18

## 2016-08-27 DIAGNOSIS — E291 Testicular hypofunction: Secondary | ICD-10-CM | POA: Diagnosis not present

## 2016-08-27 DIAGNOSIS — E349 Endocrine disorder, unspecified: Secondary | ICD-10-CM

## 2016-08-27 MED ORDER — TESTOSTERONE CYPIONATE 200 MG/ML IM SOLN
INTRAMUSCULAR | Status: AC
  Filled 2016-08-27: qty 2

## 2016-08-27 MED ORDER — TESTOSTERONE CYPIONATE 200 MG/ML IM SOLN
300.0000 mg | INTRAMUSCULAR | Status: DC
  Administered 2016-08-27: 300 mg via INTRAMUSCULAR

## 2016-08-27 NOTE — Patient Instructions (Signed)
Testosterone injection What is this medicine? TESTOSTERONE (tes TOS ter one) is the main male hormone. It supports normal male development such as muscle growth, facial hair, and deep voice. It is used in males to treat low testosterone levels. This medicine may be used for other purposes; ask your health care provider or pharmacist if you have questions. COMMON BRAND NAME(S): Andro-L.A., Aveed, Delatestryl, Depo-Testosterone, Virilon What should I tell my health care provider before I take this medicine? They need to know if you have any of these conditions: -cancer -diabetes -heart disease -kidney disease -liver disease -lung disease -prostate disease -an unusual or allergic reaction to testosterone, other medicines, foods, dyes, or preservatives -pregnant or trying to get pregnant -breast-feeding How should I use this medicine? This medicine is for injection into a muscle. It is usually given by a health care professional in a hospital or clinic setting. Contact your pediatrician regarding the use of this medicine in children. While this medicine may be prescribed for children as young as 12 years of age for selected conditions, precautions do apply. Overdosage: If you think you have taken too much of this medicine contact a poison control center or emergency room at once. NOTE: This medicine is only for you. Do not share this medicine with others. What if I miss a dose? Try not to miss a dose. Your doctor or health care professional will tell you when your next injection is due. Notify the office if you are unable to keep an appointment. What may interact with this medicine? -medicines for diabetes -medicines that treat or prevent blood clots like warfarin -oxyphenbutazone -propranolol -steroid medicines like prednisone or cortisone This list may not describe all possible interactions. Give your health care provider a list of all the medicines, herbs, non-prescription drugs, or  dietary supplements you use. Also tell them if you smoke, drink alcohol, or use illegal drugs. Some items may interact with your medicine. What should I watch for while using this medicine? Visit your doctor or health care professional for regular checks on your progress. They will need to check the level of testosterone in your blood. This medicine is only approved for use in men who have low levels of testosterone related to certain medical conditions. Heart attacks and strokes have been reported with the use of this medicine. Notify your doctor or health care professional and seek emergency treatment if you develop breathing problems; changes in vision; confusion; chest pain or chest tightness; sudden arm pain; severe, sudden headache; trouble speaking or understanding; sudden numbness or weakness of the face, arm or leg; loss of balance or coordination. Talk to your doctor about the risks and benefits of this medicine. This medicine may affect blood sugar levels. If you have diabetes, check with your doctor or health care professional before you change your diet or the dose of your diabetic medicine. Testosterone injections are not commonly used in women. Women should inform their doctor if they wish to become pregnant or think they might be pregnant. There is a potential for serious side effects to an unborn child. Talk to your health care professional or pharmacist for more information. Talk with your doctor or health care professional about your birth control options while taking this medicine. This drug is banned from use in athletes by most athletic organizations. What side effects may I notice from receiving this medicine? Side effects that you should report to your doctor or health care professional as soon as possible: -allergic reactions like skin rash,   itching or hives, swelling of the face, lips, or tongue -breast enlargement -breathing problems -changes in emotions or moods -deep or  hoarse voice -irregular menstrual periods -signs and symptoms of liver injury like dark yellow or brown urine; general ill feeling or flu-like symptoms; light-colored stools; loss of appetite; nausea; right upper belly pain; unusually weak or tired; yellowing of the eyes or skin -stomach pain -swelling of the ankles, feet, hands -too frequent or persistent erections -trouble passing urine or change in the amount of urine Side effects that usually do not require medical attention (report to your doctor or health care professional if they continue or are bothersome): -acne -change in sex drive or performance -facial hair growth -hair loss -headache This list may not describe all possible side effects. Call your doctor for medical advice about side effects. You may report side effects to FDA at 1-800-FDA-1088. Where should I keep my medicine? Keep out of the reach of children. This medicine can be abused. Keep your medicine in a safe place to protect it from theft. Do not share this medicine with anyone. Selling or giving away this medicine is dangerous and against the law. Store at room temperature between 20 and 25 degrees C (68 and 77 degrees F). Do not freeze. Protect from light. Follow the directions for the product you are prescribed. Throw away any unused medicine after the expiration date. NOTE: This sheet is a summary. It may not cover all possible information. If you have questions about this medicine, talk to your doctor, pharmacist, or health care provider.  2017 Elsevier/Gold Standard (2015-08-03 07:33:55)  

## 2016-08-28 ENCOUNTER — Ambulatory Visit: Payer: Self-pay

## 2016-08-28 DIAGNOSIS — I428 Other cardiomyopathies: Secondary | ICD-10-CM | POA: Diagnosis not present

## 2016-09-02 DIAGNOSIS — J441 Chronic obstructive pulmonary disease with (acute) exacerbation: Secondary | ICD-10-CM | POA: Diagnosis not present

## 2016-09-07 DIAGNOSIS — J441 Chronic obstructive pulmonary disease with (acute) exacerbation: Secondary | ICD-10-CM | POA: Diagnosis not present

## 2016-09-11 ENCOUNTER — Ambulatory Visit: Payer: Self-pay

## 2016-09-11 ENCOUNTER — Ambulatory Visit: Payer: PPO | Admitting: Hematology & Oncology

## 2016-09-11 ENCOUNTER — Ambulatory Visit: Payer: PPO

## 2016-09-11 ENCOUNTER — Other Ambulatory Visit: Payer: PPO

## 2016-09-14 ENCOUNTER — Ambulatory Visit: Payer: PPO

## 2016-09-14 ENCOUNTER — Other Ambulatory Visit: Payer: PPO

## 2016-09-16 ENCOUNTER — Emergency Department (HOSPITAL_COMMUNITY)
Admission: EM | Admit: 2016-09-16 | Discharge: 2016-09-16 | Disposition: A | Discharge status: Home / Self Care | Payer: PPO | Attending: Emergency Medicine | Admitting: Emergency Medicine

## 2016-09-16 ENCOUNTER — Encounter (HOSPITAL_COMMUNITY): Payer: Self-pay

## 2016-09-16 ENCOUNTER — Emergency Department (HOSPITAL_COMMUNITY): Payer: PPO

## 2016-09-16 DIAGNOSIS — G8929 Other chronic pain: Secondary | ICD-10-CM | POA: Insufficient documentation

## 2016-09-16 DIAGNOSIS — E119 Type 2 diabetes mellitus without complications: Secondary | ICD-10-CM | POA: Diagnosis not present

## 2016-09-16 DIAGNOSIS — M545 Low back pain, unspecified: Secondary | ICD-10-CM

## 2016-09-16 DIAGNOSIS — S3992XA Unspecified injury of lower back, initial encounter: Secondary | ICD-10-CM | POA: Diagnosis not present

## 2016-09-16 DIAGNOSIS — M7918 Myalgia, other site: Secondary | ICD-10-CM

## 2016-09-16 DIAGNOSIS — Z7982 Long term (current) use of aspirin: Secondary | ICD-10-CM | POA: Diagnosis not present

## 2016-09-16 DIAGNOSIS — Y939 Activity, unspecified: Secondary | ICD-10-CM | POA: Insufficient documentation

## 2016-09-16 DIAGNOSIS — R51 Headache: Secondary | ICD-10-CM | POA: Insufficient documentation

## 2016-09-16 DIAGNOSIS — M542 Cervicalgia: Secondary | ICD-10-CM | POA: Insufficient documentation

## 2016-09-16 DIAGNOSIS — J449 Chronic obstructive pulmonary disease, unspecified: Secondary | ICD-10-CM | POA: Diagnosis not present

## 2016-09-16 DIAGNOSIS — I509 Heart failure, unspecified: Secondary | ICD-10-CM | POA: Insufficient documentation

## 2016-09-16 DIAGNOSIS — Y999 Unspecified external cause status: Secondary | ICD-10-CM | POA: Diagnosis not present

## 2016-09-16 DIAGNOSIS — S79912A Unspecified injury of left hip, initial encounter: Secondary | ICD-10-CM | POA: Diagnosis not present

## 2016-09-16 DIAGNOSIS — Y92410 Unspecified street and highway as the place of occurrence of the external cause: Secondary | ICD-10-CM | POA: Insufficient documentation

## 2016-09-16 DIAGNOSIS — M549 Dorsalgia, unspecified: Secondary | ICD-10-CM | POA: Diagnosis not present

## 2016-09-16 DIAGNOSIS — M25552 Pain in left hip: Secondary | ICD-10-CM | POA: Diagnosis not present

## 2016-09-16 DIAGNOSIS — Z87891 Personal history of nicotine dependence: Secondary | ICD-10-CM | POA: Insufficient documentation

## 2016-09-16 DIAGNOSIS — S299XXA Unspecified injury of thorax, initial encounter: Secondary | ICD-10-CM | POA: Diagnosis not present

## 2016-09-16 DIAGNOSIS — I110 Hypertensive heart disease with heart failure: Secondary | ICD-10-CM | POA: Diagnosis not present

## 2016-09-16 DIAGNOSIS — S0990XA Unspecified injury of head, initial encounter: Secondary | ICD-10-CM | POA: Diagnosis not present

## 2016-09-16 DIAGNOSIS — S199XXA Unspecified injury of neck, initial encounter: Secondary | ICD-10-CM | POA: Diagnosis not present

## 2016-09-16 DIAGNOSIS — R404 Transient alteration of awareness: Secondary | ICD-10-CM | POA: Diagnosis not present

## 2016-09-16 DIAGNOSIS — Z79899 Other long term (current) drug therapy: Secondary | ICD-10-CM | POA: Insufficient documentation

## 2016-09-16 DIAGNOSIS — I2510 Atherosclerotic heart disease of native coronary artery without angina pectoris: Secondary | ICD-10-CM | POA: Insufficient documentation

## 2016-09-16 DIAGNOSIS — M791 Myalgia: Secondary | ICD-10-CM | POA: Diagnosis not present

## 2016-09-16 DIAGNOSIS — R079 Chest pain, unspecified: Secondary | ICD-10-CM | POA: Diagnosis not present

## 2016-09-16 DIAGNOSIS — R42 Dizziness and giddiness: Secondary | ICD-10-CM | POA: Diagnosis not present

## 2016-09-16 LAB — I-STAT TROPONIN, ED: TROPONIN I, POC: 0.01 ng/mL (ref 0.00–0.08)

## 2016-09-16 MED ORDER — MORPHINE SULFATE (PF) 4 MG/ML IV SOLN
4.0000 mg | Freq: Once | INTRAVENOUS | Status: AC
  Administered 2016-09-16: 4 mg via INTRAVENOUS
  Filled 2016-09-16: qty 1

## 2016-09-16 MED ORDER — NAPROXEN 375 MG PO TABS: 375.0000 mg | ORAL_TABLET | Freq: Two times a day (BID) | ORAL | 0 refills | Status: DC

## 2016-09-16 MED ORDER — CYCLOBENZAPRINE HCL 10 MG PO TABS: 10.0000 mg | ORAL_TABLET | Freq: Once | ORAL | Status: DC

## 2016-09-16 MED ORDER — CYCLOBENZAPRINE HCL 10 MG PO TABS: 10.0000 mg | ORAL_TABLET | Freq: Two times a day (BID) | ORAL | 0 refills | Status: DC | PRN
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
## 2016-09-16 NOTE — ED Notes (Signed)
[employer] at bedside 
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
## 2016-09-16 NOTE — ED Notes (Signed)
Patient transported to X-ray 

## 2016-09-22 DIAGNOSIS — G8929 Other chronic pain: Secondary | ICD-10-CM | POA: Diagnosis not present

## 2016-09-22 DIAGNOSIS — E118 Type 2 diabetes mellitus with unspecified complications: Secondary | ICD-10-CM | POA: Diagnosis not present

## 2016-09-22 DIAGNOSIS — I10 Essential (primary) hypertension: Secondary | ICD-10-CM | POA: Diagnosis not present

## 2016-09-22 DIAGNOSIS — M5440 Lumbago with sciatica, unspecified side: Secondary | ICD-10-CM | POA: Diagnosis not present

## 2016-09-24 DIAGNOSIS — R079 Chest pain, unspecified: Secondary | ICD-10-CM | POA: Diagnosis not present

## 2016-09-24 DIAGNOSIS — I517 Cardiomegaly: Secondary | ICD-10-CM | POA: Diagnosis not present

## 2016-09-24 DIAGNOSIS — Z09 Encounter for follow-up examination after completed treatment for conditions other than malignant neoplasm: Secondary | ICD-10-CM | POA: Diagnosis not present

## 2016-09-25 ENCOUNTER — Telehealth: Payer: Self-pay | Admitting: *Deleted

## 2016-09-25 ENCOUNTER — Other Ambulatory Visit (HOSPITAL_BASED_OUTPATIENT_CLINIC_OR_DEPARTMENT_OTHER): Payer: PPO

## 2016-09-25 ENCOUNTER — Ambulatory Visit (HOSPITAL_BASED_OUTPATIENT_CLINIC_OR_DEPARTMENT_OTHER): Payer: PPO

## 2016-09-25 DIAGNOSIS — E291 Testicular hypofunction: Secondary | ICD-10-CM

## 2016-09-25 DIAGNOSIS — D508 Other iron deficiency anemias: Secondary | ICD-10-CM

## 2016-09-25 DIAGNOSIS — I5022 Chronic systolic (congestive) heart failure: Secondary | ICD-10-CM | POA: Diagnosis not present

## 2016-09-25 DIAGNOSIS — D509 Iron deficiency anemia, unspecified: Secondary | ICD-10-CM | POA: Diagnosis not present

## 2016-09-25 DIAGNOSIS — E349 Endocrine disorder, unspecified: Secondary | ICD-10-CM

## 2016-09-25 LAB — CBC WITH DIFFERENTIAL (CANCER CENTER ONLY)
BASO#: 0 10*3/uL (ref 0.0–0.2)
BASO%: 0.4 % (ref 0.0–2.0)
EOS ABS: 0.2 10*3/uL (ref 0.0–0.5)
EOS%: 3.2 % (ref 0.0–7.0)
HCT: 47.1 % (ref 38.7–49.9)
HGB: 15.4 g/dL (ref 13.0–17.1)
LYMPH#: 2.4 10*3/uL (ref 0.9–3.3)
LYMPH%: 34.9 % (ref 14.0–48.0)
MCH: 28.7 pg (ref 28.0–33.4)
MCHC: 32.7 g/dL (ref 32.0–35.9)
MCV: 88 fL (ref 82–98)
MONO#: 0.4 10*3/uL (ref 0.1–0.9)
MONO%: 6.3 % (ref 0.0–13.0)
NEUT#: 3.8 10*3/uL (ref 1.5–6.5)
NEUT%: 55.2 % (ref 40.0–80.0)
PLATELETS: 284 10*3/uL (ref 145–400)
RBC: 5.37 10*6/uL (ref 4.20–5.70)
RDW: 15.3 % (ref 11.1–15.7)
WBC: 6.8 10*3/uL (ref 4.0–10.0)

## 2016-09-25 LAB — COMPREHENSIVE METABOLIC PANEL
ALBUMIN: 3.6 g/dL (ref 3.5–5.0)
ALK PHOS: 126 U/L (ref 40–150)
ALT: 23 U/L (ref 0–55)
ANION GAP: 11 meq/L (ref 3–11)
AST: 19 U/L (ref 5–34)
BILIRUBIN TOTAL: 0.45 mg/dL (ref 0.20–1.20)
BUN: 13.1 mg/dL (ref 7.0–26.0)
CALCIUM: 9.5 mg/dL (ref 8.4–10.4)
CO2: 28 mEq/L (ref 22–29)
CREATININE: 1.2 mg/dL (ref 0.7–1.3)
Chloride: 102 mEq/L (ref 98–109)
EGFR: 74 mL/min/{1.73_m2} — ABNORMAL LOW (ref >90)
Glucose: 116 mg/dl (ref 70–140)
Potassium: 2.8 mEq/L — CL (ref 3.5–5.1)
Sodium: 141 mEq/L (ref 136–145)
TOTAL PROTEIN: 7.8 g/dL (ref 6.4–8.3)

## 2016-09-25 LAB — IRON AND TIBC
%SAT: 18 % — ABNORMAL LOW (ref 20–55)
Iron: 50 ug/dL (ref 42–163)
TIBC: 280 ug/dL (ref 202–409)
UIBC: 230 ug/dL (ref 117–376)

## 2016-09-25 LAB — FERRITIN: Ferritin: 260 ng/ml (ref 22–316)

## 2016-09-25 MED ORDER — TESTOSTERONE CYPIONATE 200 MG/ML IM SOLN
INTRAMUSCULAR | Status: AC
  Filled 2016-09-25: qty 2

## 2016-09-25 MED ORDER — TESTOSTERONE CYPIONATE 200 MG/ML IM SOLN
300.0000 mg | INTRAMUSCULAR | Status: DC
  Administered 2016-09-25: 300 mg via INTRAMUSCULAR

## 2016-09-25 NOTE — Telephone Encounter (Addendum)
Several attempts made to contact patient. Unable to reach patient as no on picks up, and there is no ability to leave message. Patient has a scheduled appointment on 3/30. Will address this need then.  ----- Message from Volanda Napoleon, MD sent at 09/25/2016 12:24 PM EDT ----- Call - iron is low!!  He needs 1 dose of feraheme.  pete

## 2016-09-25 NOTE — Patient Instructions (Signed)
Testosterone injection What is this medicine? TESTOSTERONE (tes TOS ter one) is the main male hormone. It supports normal male development such as muscle growth, facial hair, and deep voice. It is used in males to treat low testosterone levels. This medicine may be used for other purposes; ask your health care provider or pharmacist if you have questions. COMMON BRAND NAME(S): Andro-L.A., Aveed, Delatestryl, Depo-Testosterone, Virilon What should I tell my health care provider before I take this medicine? They need to know if you have any of these conditions: -cancer -diabetes -heart disease -kidney disease -liver disease -lung disease -prostate disease -an unusual or allergic reaction to testosterone, other medicines, foods, dyes, or preservatives -pregnant or trying to get pregnant -breast-feeding How should I use this medicine? This medicine is for injection into a muscle. It is usually given by a health care professional in a hospital or clinic setting. Contact your pediatrician regarding the use of this medicine in children. While this medicine may be prescribed for children as young as 12 years of age for selected conditions, precautions do apply. Overdosage: If you think you have taken too much of this medicine contact a poison control center or emergency room at once. NOTE: This medicine is only for you. Do not share this medicine with others. What if I miss a dose? Try not to miss a dose. Your doctor or health care professional will tell you when your next injection is due. Notify the office if you are unable to keep an appointment. What may interact with this medicine? -medicines for diabetes -medicines that treat or prevent blood clots like warfarin -oxyphenbutazone -propranolol -steroid medicines like prednisone or cortisone This list may not describe all possible interactions. Give your health care provider a list of all the medicines, herbs, non-prescription drugs, or  dietary supplements you use. Also tell them if you smoke, drink alcohol, or use illegal drugs. Some items may interact with your medicine. What should I watch for while using this medicine? Visit your doctor or health care professional for regular checks on your progress. They will need to check the level of testosterone in your blood. This medicine is only approved for use in men who have low levels of testosterone related to certain medical conditions. Heart attacks and strokes have been reported with the use of this medicine. Notify your doctor or health care professional and seek emergency treatment if you develop breathing problems; changes in vision; confusion; chest pain or chest tightness; sudden arm pain; severe, sudden headache; trouble speaking or understanding; sudden numbness or weakness of the face, arm or leg; loss of balance or coordination. Talk to your doctor about the risks and benefits of this medicine. This medicine may affect blood sugar levels. If you have diabetes, check with your doctor or health care professional before you change your diet or the dose of your diabetic medicine. Testosterone injections are not commonly used in women. Women should inform their doctor if they wish to become pregnant or think they might be pregnant. There is a potential for serious side effects to an unborn child. Talk to your health care professional or pharmacist for more information. Talk with your doctor or health care professional about your birth control options while taking this medicine. This drug is banned from use in athletes by most athletic organizations. What side effects may I notice from receiving this medicine? Side effects that you should report to your doctor or health care professional as soon as possible: -allergic reactions like skin rash,   itching or hives, swelling of the face, lips, or tongue -breast enlargement -breathing problems -changes in emotions or moods -deep or  hoarse voice -irregular menstrual periods -signs and symptoms of liver injury like dark yellow or brown urine; general ill feeling or flu-like symptoms; light-colored stools; loss of appetite; nausea; right upper belly pain; unusually weak or tired; yellowing of the eyes or skin -stomach pain -swelling of the ankles, feet, hands -too frequent or persistent erections -trouble passing urine or change in the amount of urine Side effects that usually do not require medical attention (report to your doctor or health care professional if they continue or are bothersome): -acne -change in sex drive or performance -facial hair growth -hair loss -headache This list may not describe all possible side effects. Call your doctor for medical advice about side effects. You may report side effects to FDA at 1-800-FDA-1088. Where should I keep my medicine? Keep out of the reach of children. This medicine can be abused. Keep your medicine in a safe place to protect it from theft. Do not share this medicine with anyone. Selling or giving away this medicine is dangerous and against the law. Store at room temperature between 20 and 25 degrees C (68 and 77 degrees F). Do not freeze. Protect from light. Follow the directions for the product you are prescribed. Throw away any unused medicine after the expiration date. NOTE: This sheet is a summary. It may not cover all possible information. If you have questions about this medicine, talk to your doctor, pharmacist, or health care provider.  2018 Elsevier/Gold Standard (2015-08-03 07:33:55)  

## 2016-09-25 NOTE — Telephone Encounter (Signed)
Critical Value Potassium 2.8 Dr Marin Olp aware. Order received

## 2016-09-26 LAB — TESTOSTERONE: TESTOSTERONE: 194 ng/dL — AB (ref 264–916)

## 2016-09-30 DIAGNOSIS — J441 Chronic obstructive pulmonary disease with (acute) exacerbation: Secondary | ICD-10-CM | POA: Diagnosis not present

## 2016-10-05 DIAGNOSIS — J441 Chronic obstructive pulmonary disease with (acute) exacerbation: Secondary | ICD-10-CM | POA: Diagnosis not present

## 2016-10-09 ENCOUNTER — Ambulatory Visit (HOSPITAL_BASED_OUTPATIENT_CLINIC_OR_DEPARTMENT_OTHER): Payer: PPO

## 2016-10-09 VITALS — BP 135/94 | HR 97 | Temp 97.8°F | Resp 20

## 2016-10-09 DIAGNOSIS — E349 Endocrine disorder, unspecified: Secondary | ICD-10-CM

## 2016-10-09 DIAGNOSIS — E291 Testicular hypofunction: Secondary | ICD-10-CM

## 2016-10-09 MED ORDER — TESTOSTERONE CYPIONATE 200 MG/ML IM SOLN
300.0000 mg | INTRAMUSCULAR | Status: DC
  Administered 2016-10-09: 300 mg via INTRAMUSCULAR

## 2016-10-09 MED ORDER — TESTOSTERONE CYPIONATE 200 MG/ML IM SOLN
INTRAMUSCULAR | Status: AC
  Filled 2016-10-09: qty 2

## 2016-10-12 ENCOUNTER — Ambulatory Visit (HOSPITAL_BASED_OUTPATIENT_CLINIC_OR_DEPARTMENT_OTHER): Payer: PPO

## 2016-10-12 VITALS — BP 126/86 | HR 62 | Temp 98.3°F | Resp 18

## 2016-10-12 DIAGNOSIS — D500 Iron deficiency anemia secondary to blood loss (chronic): Secondary | ICD-10-CM

## 2016-10-12 DIAGNOSIS — D509 Iron deficiency anemia, unspecified: Secondary | ICD-10-CM

## 2016-10-12 MED ORDER — SODIUM CHLORIDE 0.9 % IV SOLN
510.0000 mg | Freq: Once | INTRAVENOUS | Status: AC
  Administered 2016-10-12: 510 mg via INTRAVENOUS
  Filled 2016-10-12: qty 17

## 2016-10-12 NOTE — Patient Instructions (Signed)

## 2016-10-15 DIAGNOSIS — J449 Chronic obstructive pulmonary disease, unspecified: Secondary | ICD-10-CM | POA: Diagnosis not present

## 2016-10-15 DIAGNOSIS — G4733 Obstructive sleep apnea (adult) (pediatric): Secondary | ICD-10-CM | POA: Diagnosis not present

## 2016-10-22 DIAGNOSIS — I10 Essential (primary) hypertension: Secondary | ICD-10-CM | POA: Diagnosis not present

## 2016-10-22 DIAGNOSIS — R0789 Other chest pain: Secondary | ICD-10-CM | POA: Diagnosis not present

## 2016-10-22 DIAGNOSIS — I428 Other cardiomyopathies: Secondary | ICD-10-CM | POA: Diagnosis not present

## 2016-10-22 DIAGNOSIS — J9610 Chronic respiratory failure, unspecified whether with hypoxia or hypercapnia: Secondary | ICD-10-CM | POA: Diagnosis not present

## 2016-10-22 DIAGNOSIS — E785 Hyperlipidemia, unspecified: Secondary | ICD-10-CM | POA: Diagnosis not present

## 2016-10-23 ENCOUNTER — Ambulatory Visit (HOSPITAL_BASED_OUTPATIENT_CLINIC_OR_DEPARTMENT_OTHER): Payer: PPO

## 2016-10-23 ENCOUNTER — Ambulatory Visit: Payer: Self-pay

## 2016-10-23 VITALS — BP 119/80 | HR 70 | Temp 98.1°F | Resp 18

## 2016-10-23 DIAGNOSIS — E291 Testicular hypofunction: Secondary | ICD-10-CM | POA: Diagnosis not present

## 2016-10-23 DIAGNOSIS — E349 Endocrine disorder, unspecified: Secondary | ICD-10-CM

## 2016-10-23 MED ORDER — TESTOSTERONE CYPIONATE 200 MG/ML IM SOLN
300.0000 mg | INTRAMUSCULAR | Status: DC
  Administered 2016-10-23: 300 mg via INTRAMUSCULAR

## 2016-10-23 MED ORDER — TESTOSTERONE CYPIONATE 200 MG/ML IM SOLN
INTRAMUSCULAR | Status: AC
  Filled 2016-10-23: qty 2

## 2016-11-05 DIAGNOSIS — J441 Chronic obstructive pulmonary disease with (acute) exacerbation: Secondary | ICD-10-CM | POA: Diagnosis not present

## 2016-11-06 ENCOUNTER — Ambulatory Visit (HOSPITAL_BASED_OUTPATIENT_CLINIC_OR_DEPARTMENT_OTHER): Payer: PPO

## 2016-11-06 DIAGNOSIS — E291 Testicular hypofunction: Secondary | ICD-10-CM | POA: Diagnosis not present

## 2016-11-06 DIAGNOSIS — E349 Endocrine disorder, unspecified: Secondary | ICD-10-CM

## 2016-11-06 MED ORDER — TESTOSTERONE CYPIONATE 200 MG/ML IM SOLN
INTRAMUSCULAR | Status: AC
  Filled 2016-11-06: qty 2

## 2016-11-06 MED ORDER — TESTOSTERONE CYPIONATE 200 MG/ML IM SOLN
300.0000 mg | INTRAMUSCULAR | Status: DC
  Administered 2016-11-06: 300 mg via INTRAMUSCULAR

## 2016-11-06 NOTE — Patient Instructions (Signed)
Testosterone injection What is this medicine? TESTOSTERONE (tes TOS ter one) is the main male hormone. It supports normal male development such as muscle growth, facial hair, and deep voice. It is used in males to treat low testosterone levels. This medicine may be used for other purposes; ask your health care provider or pharmacist if you have questions. COMMON BRAND NAME(S): Andro-L.A., Aveed, Delatestryl, Depo-Testosterone, Virilon What should I tell my health care provider before I take this medicine? They need to know if you have any of these conditions: -cancer -diabetes -heart disease -kidney disease -liver disease -lung disease -prostate disease -an unusual or allergic reaction to testosterone, other medicines, foods, dyes, or preservatives -pregnant or trying to get pregnant -breast-feeding How should I use this medicine? This medicine is for injection into a muscle. It is usually given by a health care professional in a hospital or clinic setting. Contact your pediatrician regarding the use of this medicine in children. While this medicine may be prescribed for children as young as 12 years of age for selected conditions, precautions do apply. Overdosage: If you think you have taken too much of this medicine contact a poison control center or emergency room at once. NOTE: This medicine is only for you. Do not share this medicine with others. What if I miss a dose? Try not to miss a dose. Your doctor or health care professional will tell you when your next injection is due. Notify the office if you are unable to keep an appointment. What may interact with this medicine? -medicines for diabetes -medicines that treat or prevent blood clots like warfarin -oxyphenbutazone -propranolol -steroid medicines like prednisone or cortisone This list may not describe all possible interactions. Give your health care provider a list of all the medicines, herbs, non-prescription drugs, or  dietary supplements you use. Also tell them if you smoke, drink alcohol, or use illegal drugs. Some items may interact with your medicine. What should I watch for while using this medicine? Visit your doctor or health care professional for regular checks on your progress. They will need to check the level of testosterone in your blood. This medicine is only approved for use in men who have low levels of testosterone related to certain medical conditions. Heart attacks and strokes have been reported with the use of this medicine. Notify your doctor or health care professional and seek emergency treatment if you develop breathing problems; changes in vision; confusion; chest pain or chest tightness; sudden arm pain; severe, sudden headache; trouble speaking or understanding; sudden numbness or weakness of the face, arm or leg; loss of balance or coordination. Talk to your doctor about the risks and benefits of this medicine. This medicine may affect blood sugar levels. If you have diabetes, check with your doctor or health care professional before you change your diet or the dose of your diabetic medicine. Testosterone injections are not commonly used in women. Women should inform their doctor if they wish to become pregnant or think they might be pregnant. There is a potential for serious side effects to an unborn child. Talk to your health care professional or pharmacist for more information. Talk with your doctor or health care professional about your birth control options while taking this medicine. This drug is banned from use in athletes by most athletic organizations. What side effects may I notice from receiving this medicine? Side effects that you should report to your doctor or health care professional as soon as possible: -allergic reactions like skin rash,   itching or hives, swelling of the face, lips, or tongue -breast enlargement -breathing problems -changes in emotions or moods -deep or  hoarse voice -irregular menstrual periods -signs and symptoms of liver injury like dark yellow or brown urine; general ill feeling or flu-like symptoms; light-colored stools; loss of appetite; nausea; right upper belly pain; unusually weak or tired; yellowing of the eyes or skin -stomach pain -swelling of the ankles, feet, hands -too frequent or persistent erections -trouble passing urine or change in the amount of urine Side effects that usually do not require medical attention (report to your doctor or health care professional if they continue or are bothersome): -acne -change in sex drive or performance -facial hair growth -hair loss -headache This list may not describe all possible side effects. Call your doctor for medical advice about side effects. You may report side effects to FDA at 1-800-FDA-1088. Where should I keep my medicine? Keep out of the reach of children. This medicine can be abused. Keep your medicine in a safe place to protect it from theft. Do not share this medicine with anyone. Selling or giving away this medicine is dangerous and against the law. Store at room temperature between 20 and 25 degrees C (68 and 77 degrees F). Do not freeze. Protect from light. Follow the directions for the product you are prescribed. Throw away any unused medicine after the expiration date. NOTE: This sheet is a summary. It may not cover all possible information. If you have questions about this medicine, talk to your doctor, pharmacist, or health care provider.  2018 Elsevier/Gold Standard (2015-08-03 07:33:55)  

## 2016-11-10 DIAGNOSIS — E876 Hypokalemia: Secondary | ICD-10-CM

## 2016-11-10 HISTORY — DX: Hypokalemia: E87.6

## 2016-11-11 ENCOUNTER — Encounter (INDEPENDENT_AMBULATORY_CARE_PROVIDER_SITE_OTHER): Payer: Self-pay | Admitting: Orthopaedic Surgery

## 2016-11-11 ENCOUNTER — Ambulatory Visit (INDEPENDENT_AMBULATORY_CARE_PROVIDER_SITE_OTHER): Payer: PPO

## 2016-11-11 ENCOUNTER — Ambulatory Visit (INDEPENDENT_AMBULATORY_CARE_PROVIDER_SITE_OTHER): Payer: PPO | Admitting: Orthopaedic Surgery

## 2016-11-11 VITALS — BP 132/94 | HR 70 | Ht 66.0 in | Wt 201.0 lb

## 2016-11-11 DIAGNOSIS — G8929 Other chronic pain: Secondary | ICD-10-CM

## 2016-11-11 DIAGNOSIS — M25561 Pain in right knee: Secondary | ICD-10-CM

## 2016-11-11 DIAGNOSIS — M25562 Pain in left knee: Secondary | ICD-10-CM

## 2016-11-11 MED ORDER — METHYLPREDNISOLONE ACETATE 40 MG/ML IJ SUSP
40.0000 mg | INTRAMUSCULAR | Status: AC | PRN
  Administered 2016-11-11: 40 mg via INTRA_ARTICULAR

## 2016-11-11 MED ORDER — LIDOCAINE HCL 1 % IJ SOLN
1.0000 mL | INTRAMUSCULAR | Status: AC | PRN
  Administered 2016-11-11: 1 mL

## 2016-11-11 MED ORDER — BUPIVACAINE HCL 0.25 % IJ SOLN
0.6600 mL | INTRAMUSCULAR | Status: AC | PRN
  Administered 2016-11-11: .66 mL via INTRA_ARTICULAR
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
## 2016-11-12 ENCOUNTER — Encounter (HOSPITAL_COMMUNITY): Payer: Self-pay | Admitting: *Deleted

## 2016-11-12 ENCOUNTER — Emergency Department (HOSPITAL_COMMUNITY): Payer: PPO

## 2016-11-12 ENCOUNTER — Inpatient Hospital Stay (HOSPITAL_COMMUNITY)
Admission: EM | Admit: 2016-11-12 | Discharge: 2016-11-15 | DRG: 949 | Disposition: A | Discharge status: Home / Self Care | Payer: PPO | Attending: Internal Medicine | Admitting: Internal Medicine

## 2016-11-12 DIAGNOSIS — I10 Essential (primary) hypertension: Secondary | ICD-10-CM | POA: Diagnosis not present

## 2016-11-12 DIAGNOSIS — Z9981 Dependence on supplemental oxygen: Secondary | ICD-10-CM | POA: Diagnosis not present

## 2016-11-12 DIAGNOSIS — Z7982 Long term (current) use of aspirin: Secondary | ICD-10-CM

## 2016-11-12 DIAGNOSIS — Z87891 Personal history of nicotine dependence: Secondary | ICD-10-CM | POA: Diagnosis not present

## 2016-11-12 DIAGNOSIS — D72829 Elevated white blood cell count, unspecified: Secondary | ICD-10-CM

## 2016-11-12 DIAGNOSIS — G4700 Insomnia, unspecified: Secondary | ICD-10-CM | POA: Diagnosis present

## 2016-11-12 DIAGNOSIS — E785 Hyperlipidemia, unspecified: Secondary | ICD-10-CM | POA: Diagnosis present

## 2016-11-12 DIAGNOSIS — Z7984 Long term (current) use of oral hypoglycemic drugs: Secondary | ICD-10-CM

## 2016-11-12 DIAGNOSIS — R413 Other amnesia: Secondary | ICD-10-CM | POA: Diagnosis not present

## 2016-11-12 DIAGNOSIS — J449 Chronic obstructive pulmonary disease, unspecified: Secondary | ICD-10-CM | POA: Diagnosis present

## 2016-11-12 DIAGNOSIS — R531 Weakness: Secondary | ICD-10-CM

## 2016-11-12 DIAGNOSIS — R42 Dizziness and giddiness: Secondary | ICD-10-CM | POA: Diagnosis present

## 2016-11-12 DIAGNOSIS — Z7951 Long term (current) use of inhaled steroids: Secondary | ICD-10-CM

## 2016-11-12 DIAGNOSIS — K219 Gastro-esophageal reflux disease without esophagitis: Secondary | ICD-10-CM | POA: Diagnosis present

## 2016-11-12 DIAGNOSIS — R079 Chest pain, unspecified: Secondary | ICD-10-CM | POA: Diagnosis not present

## 2016-11-12 DIAGNOSIS — G8929 Other chronic pain: Secondary | ICD-10-CM | POA: Diagnosis not present

## 2016-11-12 DIAGNOSIS — Z79899 Other long term (current) drug therapy: Secondary | ICD-10-CM | POA: Diagnosis not present

## 2016-11-12 DIAGNOSIS — Z8249 Family history of ischemic heart disease and other diseases of the circulatory system: Secondary | ICD-10-CM | POA: Diagnosis not present

## 2016-11-12 DIAGNOSIS — Z833 Family history of diabetes mellitus: Secondary | ICD-10-CM

## 2016-11-12 DIAGNOSIS — M6281 Muscle weakness (generalized): Secondary | ICD-10-CM | POA: Diagnosis not present

## 2016-11-12 DIAGNOSIS — M109 Gout, unspecified: Secondary | ICD-10-CM | POA: Diagnosis present

## 2016-11-12 DIAGNOSIS — N179 Acute kidney failure, unspecified: Secondary | ICD-10-CM | POA: Diagnosis not present

## 2016-11-12 DIAGNOSIS — E876 Hypokalemia: Secondary | ICD-10-CM

## 2016-11-12 DIAGNOSIS — E118 Type 2 diabetes mellitus with unspecified complications: Secondary | ICD-10-CM

## 2016-11-12 DIAGNOSIS — R404 Transient alteration of awareness: Secondary | ICD-10-CM | POA: Diagnosis not present

## 2016-11-12 DIAGNOSIS — R001 Bradycardia, unspecified: Secondary | ICD-10-CM | POA: Diagnosis present

## 2016-11-12 DIAGNOSIS — I2510 Atherosclerotic heart disease of native coronary artery without angina pectoris: Secondary | ICD-10-CM | POA: Diagnosis present

## 2016-11-12 DIAGNOSIS — I509 Heart failure, unspecified: Secondary | ICD-10-CM | POA: Diagnosis not present

## 2016-11-12 DIAGNOSIS — E1151 Type 2 diabetes mellitus with diabetic peripheral angiopathy without gangrene: Secondary | ICD-10-CM | POA: Diagnosis present

## 2016-11-12 DIAGNOSIS — J439 Emphysema, unspecified: Secondary | ICD-10-CM | POA: Diagnosis not present

## 2016-11-12 LAB — CBC WITH DIFFERENTIAL/PLATELET
BASOS PCT: 0 %
Basophils Absolute: 0 10*3/uL (ref 0.0–0.1)
EOS PCT: 0 %
Eosinophils Absolute: 0 10*3/uL (ref 0.0–0.7)
HEMATOCRIT: 44.4 % (ref 39.0–52.0)
Hemoglobin: 14.7 g/dL (ref 13.0–17.0)
Lymphocytes Relative: 10 %
Lymphs Abs: 1.8 10*3/uL (ref 0.7–4.0)
MCH: 29.3 pg (ref 26.0–34.0)
MCHC: 33.1 g/dL (ref 30.0–36.0)
MCV: 88.4 fL (ref 78.0–100.0)
MONOS PCT: 5 %
Monocytes Absolute: 0.9 10*3/uL (ref 0.1–1.0)
NEUTROS PCT: 85 %
Neutro Abs: 15.7 10*3/uL — ABNORMAL HIGH (ref 1.7–7.7)
Platelets: 299 10*3/uL (ref 150–400)
RBC: 5.02 MIL/uL (ref 4.22–5.81)
RDW: 14.4 % (ref 11.5–15.5)
WBC: 18.4 10*3/uL — ABNORMAL HIGH (ref 4.0–10.5)

## 2016-11-12 LAB — URINALYSIS, ROUTINE W REFLEX MICROSCOPIC
Bacteria, UA: NONE SEEN
Bilirubin Urine: NEGATIVE
Ketones, ur: NEGATIVE mg/dL
Leukocytes, UA: NEGATIVE
Nitrite: NEGATIVE
PH: 7 (ref 5.0–8.0)
PROTEIN: NEGATIVE mg/dL
SPECIFIC GRAVITY, URINE: 1.009 (ref 1.005–1.030)
SQUAMOUS EPITHELIAL / LPF: NONE SEEN

## 2016-11-12 LAB — TROPONIN I
TROPONIN I: 0.04 ng/mL — AB (ref <0.03)
Troponin I: 0.04 ng/mL (ref <0.03)

## 2016-11-12 LAB — BASIC METABOLIC PANEL
Anion gap: 9 (ref 5–15)
Anion gap: 8 (ref 5–15)
BUN: 10 mg/dL (ref 6–20)
BUN: 11 mg/dL (ref 6–20)
CO2: 26 mmol/L (ref 22–32)
CO2: 26 mmol/L (ref 22–32)
CREATININE: 1.25 mg/dL — AB (ref 0.61–1.24)
Calcium: 10.4 mg/dL — ABNORMAL HIGH (ref 8.9–10.3)
Calcium: 10 mg/dL (ref 8.9–10.3)
Chloride: 104 mmol/L (ref 101–111)
Chloride: 104 mmol/L (ref 101–111)
Creatinine, Ser: 1.26 mg/dL — ABNORMAL HIGH (ref 0.61–1.24)
GFR calc non Af Amer: >60 mL/min (ref >60)
GFR, EST NON AFRICAN AMERICAN: 60 mL/min — AB (ref >60)
Glucose, Bld: 147 mg/dL — ABNORMAL HIGH (ref 65–99)
Glucose, Bld: 285 mg/dL — ABNORMAL HIGH (ref 65–99)
POTASSIUM: 4 mmol/L (ref 3.5–5.1)
SODIUM: 139 mmol/L (ref 135–145)
SODIUM: 138 mmol/L (ref 135–145)

## 2016-11-12 LAB — COMPREHENSIVE METABOLIC PANEL
ALT: 16 U/L — AB (ref 17–63)
AST: 20 U/L (ref 15–41)
Albumin: 3.5 g/dL (ref 3.5–5.0)
Alkaline Phosphatase: 99 U/L (ref 38–126)
Anion gap: 7 (ref 5–15)
BILIRUBIN TOTAL: 0.5 mg/dL (ref 0.3–1.2)
BUN: 10 mg/dL (ref 6–20)
CALCIUM: 10.2 mg/dL (ref 8.9–10.3)
CO2: 26 mmol/L (ref 22–32)
CREATININE: 1.34 mg/dL — AB (ref 0.61–1.24)
Chloride: 105 mmol/L (ref 101–111)
GFR, EST NON AFRICAN AMERICAN: 56 mL/min — AB (ref >60)
Glucose, Bld: 154 mg/dL — ABNORMAL HIGH (ref 65–99)
Potassium: <2 mmol/L — CL (ref 3.5–5.1)
Sodium: 138 mmol/L (ref 135–145)
TOTAL PROTEIN: 7 g/dL (ref 6.5–8.1)

## 2016-11-12 LAB — RAPID URINE DRUG SCREEN, HOSP PERFORMED
AMPHETAMINES: NOT DETECTED
BARBITURATES: NOT DETECTED
BENZODIAZEPINES: NOT DETECTED
COCAINE: NOT DETECTED
Opiates: NOT DETECTED
TETRAHYDROCANNABINOL: NOT DETECTED

## 2016-11-12 LAB — GLUCOSE, CAPILLARY
Glucose-Capillary: 128 mg/dL — ABNORMAL HIGH (ref 65–99)
Glucose-Capillary: 333 mg/dL — ABNORMAL HIGH (ref 65–99)

## 2016-11-12 LAB — TSH: TSH: 0.356 u[IU]/mL (ref 0.350–4.500)

## 2016-11-12 LAB — PHOSPHORUS
Phosphorus: 1.2 mg/dL — ABNORMAL LOW (ref 2.5–4.6)
Phosphorus: 2.2 mg/dL — ABNORMAL LOW (ref 2.5–4.6)
Phosphorus: 2.2 mg/dL — ABNORMAL LOW (ref 2.5–4.6)

## 2016-11-12 LAB — MAGNESIUM: Magnesium: 1.7 mg/dL (ref 1.7–2.4)

## 2016-11-12 MED ORDER — TIZANIDINE HCL 4 MG PO TABS
4.0000 mg | ORAL_TABLET | Freq: Three times a day (TID) | ORAL | Status: DC | PRN
  Administered 2016-11-12: 4 mg via ORAL
  Filled 2016-11-12 (×2): qty 1

## 2016-11-12 MED ORDER — ONDANSETRON HCL 4 MG PO TABS
4.0000 mg | ORAL_TABLET | Freq: Four times a day (QID) | ORAL | Status: DC | PRN
Start: 2016-11-12 — End: 2016-11-15

## 2016-11-12 MED ORDER — GABAPENTIN 300 MG PO CAPS
300.0000 mg | ORAL_CAPSULE | Freq: Three times a day (TID) | ORAL | Status: DC
  Administered 2016-11-12 – 2016-11-15 (×9): 300 mg via ORAL
  Filled 2016-11-12 (×9): qty 1

## 2016-11-12 MED ORDER — POTASSIUM PHOSPHATES 15 MMOLE/5ML IV SOLN
10.0000 mmol | Freq: Once | INTRAVENOUS | Status: DC
  Administered 2016-11-12: 10 mmol via INTRAVENOUS
  Filled 2016-11-12: qty 3.33

## 2016-11-12 MED ORDER — INSULIN ASPART 100 UNIT/ML ~~LOC~~ SOLN
0.0000 [IU] | Freq: Three times a day (TID) | SUBCUTANEOUS | Status: DC
  Administered 2016-11-12: 1 [IU] via SUBCUTANEOUS
  Administered 2016-11-13: 3 [IU] via SUBCUTANEOUS
  Administered 2016-11-13: 2 [IU] via SUBCUTANEOUS
  Administered 2016-11-13: 5 [IU] via SUBCUTANEOUS
  Administered 2016-11-14 (×3): 3 [IU] via SUBCUTANEOUS
  Administered 2016-11-15: 5 [IU] via SUBCUTANEOUS
  Administered 2016-11-15: 1 [IU] via SUBCUTANEOUS

## 2016-11-12 MED ORDER — POTASSIUM CHLORIDE 10 MEQ/100ML IV SOLN
10.0000 meq | INTRAVENOUS | Status: AC
  Administered 2016-11-12 (×2): 10 meq via INTRAVENOUS
  Filled 2016-11-12 (×2): qty 100

## 2016-11-12 MED ORDER — ALLOPURINOL 300 MG PO TABS
300.0000 mg | ORAL_TABLET | Freq: Every day | ORAL | Status: DC
  Administered 2016-11-12 – 2016-11-15 (×4): 300 mg via ORAL
  Filled 2016-11-12 (×4): qty 1

## 2016-11-12 MED ORDER — ZOLPIDEM TARTRATE 5 MG PO TABS
10.0000 mg | ORAL_TABLET | Freq: Every day | ORAL | Status: DC
  Administered 2016-11-12 – 2016-11-14 (×3): 10 mg via ORAL
  Filled 2016-11-12 (×3): qty 2

## 2016-11-12 MED ORDER — POTASSIUM CHLORIDE 10 MEQ/100ML IV SOLN: 10.0000 meq | Freq: Once | INTRAVENOUS | Status: DC

## 2016-11-12 MED ORDER — ATORVASTATIN CALCIUM 20 MG PO TABS
20.0000 mg | ORAL_TABLET | Freq: Every day | ORAL | Status: DC
  Administered 2016-11-12 – 2016-11-14 (×3): 20 mg via ORAL
  Filled 2016-11-12 (×3): qty 1

## 2016-11-12 MED ORDER — LOSARTAN POTASSIUM 50 MG PO TABS
100.0000 mg | ORAL_TABLET | Freq: Every day | ORAL | Status: DC
  Administered 2016-11-12 – 2016-11-15 (×4): 100 mg via ORAL
  Filled 2016-11-12 (×4): qty 2

## 2016-11-12 MED ORDER — SODIUM CHLORIDE 0.9 % IV SOLN
1.0000 g | Freq: Once | INTRAVENOUS | Status: AC
  Administered 2016-11-12: 1 g via INTRAVENOUS
  Filled 2016-11-12: qty 10

## 2016-11-12 MED ORDER — PANTOPRAZOLE SODIUM 40 MG PO TBEC
40.0000 mg | DELAYED_RELEASE_TABLET | Freq: Every day | ORAL | Status: DC
  Administered 2016-11-12 – 2016-11-15 (×4): 40 mg via ORAL
  Filled 2016-11-12 (×4): qty 1

## 2016-11-12 MED ORDER — DULOXETINE HCL 60 MG PO CPEP
60.0000 mg | ORAL_CAPSULE | Freq: Every day | ORAL | Status: DC
  Administered 2016-11-12 – 2016-11-15 (×4): 60 mg via ORAL
  Filled 2016-11-12 (×4): qty 1

## 2016-11-12 MED ORDER — TRAMADOL HCL 50 MG PO TABS
50.0000 mg | ORAL_TABLET | Freq: Every day | ORAL | Status: DC | PRN
  Administered 2016-11-12: 50 mg via ORAL
  Filled 2016-11-12: qty 1

## 2016-11-12 MED ORDER — PRIMIDONE 50 MG PO TABS
50.0000 mg | ORAL_TABLET | Freq: Every day | ORAL | Status: DC
  Administered 2016-11-12 – 2016-11-15 (×4): 50 mg via ORAL
  Filled 2016-11-12 (×4): qty 1

## 2016-11-12 MED ORDER — POTASSIUM CHLORIDE CRYS ER 10 MEQ PO TBCR: 10.0000 meq | EXTENDED_RELEASE_TABLET | Freq: Once | ORAL | Status: DC

## 2016-11-12 MED ORDER — POTASSIUM CHLORIDE 10 MEQ/100ML IV SOLN
10.0000 meq | INTRAVENOUS | Status: DC
  Administered 2016-11-12: 10 meq via INTRAVENOUS
  Filled 2016-11-12: qty 100

## 2016-11-12 MED ORDER — POTASSIUM CHLORIDE CRYS ER 20 MEQ PO TBCR: 40.0000 meq | EXTENDED_RELEASE_TABLET | Freq: Once | ORAL | Status: DC

## 2016-11-12 MED ORDER — ASPIRIN EC 81 MG PO TBEC
81.0000 mg | DELAYED_RELEASE_TABLET | Freq: Every day | ORAL | Status: DC
  Administered 2016-11-12 – 2016-11-15 (×4): 81 mg via ORAL
  Filled 2016-11-12 (×4): qty 1

## 2016-11-12 MED ORDER — FLUTICASONE-UMECLIDIN-VILANT 100-62.5-25 MCG/INH IN AEPB: 1.0000 | INHALATION_SPRAY | Freq: Every day | RESPIRATORY_TRACT | Status: DC

## 2016-11-12 MED ORDER — TAMSULOSIN HCL 0.4 MG PO CAPS
0.4000 mg | ORAL_CAPSULE | Freq: Every day | ORAL | Status: DC
  Administered 2016-11-12 – 2016-11-15 (×4): 0.4 mg via ORAL
  Filled 2016-11-12 (×4): qty 1

## 2016-11-12 MED ORDER — INSULIN ASPART 100 UNIT/ML ~~LOC~~ SOLN
0.0000 [IU] | Freq: Every day | SUBCUTANEOUS | Status: DC
  Administered 2016-11-12 – 2016-11-13 (×2): 4 [IU] via SUBCUTANEOUS

## 2016-11-12 MED ORDER — ALBUTEROL SULFATE (2.5 MG/3ML) 0.083% IN NEBU
2.5000 mg | INHALATION_SOLUTION | Freq: Four times a day (QID) | RESPIRATORY_TRACT | Status: DC | PRN
Start: 2016-11-12 — End: 2016-11-15

## 2016-11-12 MED ORDER — POTASSIUM CHLORIDE CRYS ER 20 MEQ PO TBCR
60.0000 meq | EXTENDED_RELEASE_TABLET | Freq: Once | ORAL | Status: AC
  Administered 2016-11-12: 60 meq via ORAL
  Filled 2016-11-12: qty 3

## 2016-11-12 MED ORDER — SODIUM CHLORIDE 0.9 % IV SOLN
INTRAVENOUS | Status: DC
  Administered 2016-11-12: 16:00:00 via INTRAVENOUS

## 2016-11-12 MED ORDER — POTASSIUM CHLORIDE 10 MEQ/100ML IV SOLN
10.0000 meq | INTRAVENOUS | Status: AC
  Administered 2016-11-12 – 2016-11-13 (×3): 10 meq via INTRAVENOUS
  Filled 2016-11-12 (×3): qty 100

## 2016-11-12 MED ORDER — TIOTROPIUM BROMIDE MONOHYDRATE 18 MCG IN CAPS
18.0000 ug | ORAL_CAPSULE | Freq: Every day | RESPIRATORY_TRACT | Status: DC
  Administered 2016-11-13 – 2016-11-15 (×3): 18 ug via RESPIRATORY_TRACT
  Filled 2016-11-12: qty 5

## 2016-11-12 MED ORDER — POTASSIUM CHLORIDE 20 MEQ PO PACK
80.0000 meq | PACK | Freq: Once | ORAL | Status: AC
  Administered 2016-11-12: 80 meq via ORAL
  Filled 2016-11-12 (×2): qty 4

## 2016-11-12 MED ORDER — ONDANSETRON HCL 4 MG/2ML IJ SOLN: 4.0000 mg | Freq: Four times a day (QID) | INTRAMUSCULAR | Status: DC | PRN

## 2016-11-12 MED ORDER — UMECLIDINIUM BROMIDE 62.5 MCG/INH IN AEPB
1.0000 | INHALATION_SPRAY | Freq: Every day | RESPIRATORY_TRACT | Status: DC
  Administered 2016-11-13 – 2016-11-15 (×3): 1 via RESPIRATORY_TRACT
  Filled 2016-11-12: qty 7

## 2016-11-12 MED ORDER — HEPARIN SODIUM (PORCINE) 5000 UNIT/ML IJ SOLN
5000.0000 [IU] | Freq: Three times a day (TID) | INTRAMUSCULAR | Status: DC
  Administered 2016-11-12 – 2016-11-15 (×9): 5000 [IU] via SUBCUTANEOUS
  Filled 2016-11-12 (×9): qty 1

## 2016-11-12 MED ORDER — POTASSIUM CHLORIDE 10 MEQ/100ML IV SOLN: 10.0000 meq | INTRAVENOUS | Status: DC

## 2016-11-12 MED ORDER — POTASSIUM CHLORIDE CRYS ER 20 MEQ PO TBCR
40.0000 meq | EXTENDED_RELEASE_TABLET | Freq: Once | ORAL | Status: AC
  Administered 2016-11-12: 40 meq via ORAL
  Filled 2016-11-12: qty 2

## 2016-11-12 NOTE — ED Notes (Signed)
Dr. Marily Memos at bedside to see patient

## 2016-11-12 NOTE — ED Notes (Signed)
Got patient undress on the monitor did ekg shown to Dr Campos 

## 2016-11-12 NOTE — Progress Notes (Signed)
RN notified Dr Marily Memos of patient's critical lab results of potassium less than 2.0 and troponin of 0.04. Dr Marily Memos gave RN verbal orders for potassium chloride IV x 6 bags ; Kdur 40 meqx1 now, Kdur 40 meq  x1 tonight at bedtime, KDur 40 meq x1 in AM. RN notified pharmacy of this as well.

## 2016-11-12 NOTE — ED Notes (Signed)
Pt reported potassium was burning his arm, rate of potassium slowed, iv line flushed with saline and heat pack applied to arm for comfort. No redness or swelling present at iv site.

## 2016-11-12 NOTE — ED Notes (Signed)
Portable xray at bedside.
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
## 2016-11-12 NOTE — ED Triage Notes (Signed)
Pt arrives from home via [facility]. Pt states he began feeling tired yesterday evening before bed and wakes up with generalized weakness. Pt has a hx of CHF and HTN, on lopressor. HR is in the 40s which is new for pt. Pt also has bilateral edema to lower legs. Pt states the only oral intake is 32 oz of pepsi daily. Pt wears a CPAP at night. No current c/o CP, SOB, HA or any pain.

## 2016-11-12 NOTE — Progress Notes (Signed)
CRITICAL VALUE ALERT  Critical value received:  Potassium less than 2.0 ; troponin 0.04  Date of notification:  11/12/16  Time of notification:  1810  Critical value read back:Yes  Nurse who received alert:  Charise Carwin, RN  MD notified (1st page):  Dr Marily Memos  Time of first page:  1812  MD notified (2nd page):  Time of second page:  Responding MD:  Dr Marily Memos  Time MD responded:  [phone]

## 2016-11-13 DIAGNOSIS — Z7984 Long term (current) use of oral hypoglycemic drugs: Secondary | ICD-10-CM | POA: Diagnosis not present

## 2016-11-13 DIAGNOSIS — R531 Weakness: Secondary | ICD-10-CM | POA: Diagnosis not present

## 2016-11-13 DIAGNOSIS — G4733 Obstructive sleep apnea (adult) (pediatric): Secondary | ICD-10-CM | POA: Diagnosis not present

## 2016-11-13 DIAGNOSIS — G8929 Other chronic pain: Secondary | ICD-10-CM | POA: Diagnosis not present

## 2016-11-13 DIAGNOSIS — I509 Heart failure, unspecified: Secondary | ICD-10-CM | POA: Diagnosis not present

## 2016-11-13 DIAGNOSIS — E876 Hypokalemia: Secondary | ICD-10-CM | POA: Diagnosis not present

## 2016-11-13 DIAGNOSIS — M109 Gout, unspecified: Secondary | ICD-10-CM | POA: Diagnosis not present

## 2016-11-13 DIAGNOSIS — R413 Other amnesia: Secondary | ICD-10-CM | POA: Diagnosis not present

## 2016-11-13 DIAGNOSIS — K219 Gastro-esophageal reflux disease without esophagitis: Secondary | ICD-10-CM | POA: Diagnosis not present

## 2016-11-13 DIAGNOSIS — Z8249 Family history of ischemic heart disease and other diseases of the circulatory system: Secondary | ICD-10-CM | POA: Diagnosis not present

## 2016-11-13 DIAGNOSIS — Z833 Family history of diabetes mellitus: Secondary | ICD-10-CM | POA: Diagnosis not present

## 2016-11-13 DIAGNOSIS — Z7951 Long term (current) use of inhaled steroids: Secondary | ICD-10-CM | POA: Diagnosis not present

## 2016-11-13 DIAGNOSIS — I2510 Atherosclerotic heart disease of native coronary artery without angina pectoris: Secondary | ICD-10-CM | POA: Diagnosis not present

## 2016-11-13 DIAGNOSIS — Z87891 Personal history of nicotine dependence: Secondary | ICD-10-CM | POA: Diagnosis not present

## 2016-11-13 DIAGNOSIS — Z7982 Long term (current) use of aspirin: Secondary | ICD-10-CM | POA: Diagnosis not present

## 2016-11-13 DIAGNOSIS — Z9981 Dependence on supplemental oxygen: Secondary | ICD-10-CM | POA: Diagnosis not present

## 2016-11-13 DIAGNOSIS — E118 Type 2 diabetes mellitus with unspecified complications: Secondary | ICD-10-CM | POA: Diagnosis not present

## 2016-11-13 DIAGNOSIS — R001 Bradycardia, unspecified: Secondary | ICD-10-CM | POA: Diagnosis not present

## 2016-11-13 DIAGNOSIS — N179 Acute kidney failure, unspecified: Secondary | ICD-10-CM | POA: Diagnosis not present

## 2016-11-13 DIAGNOSIS — I10 Essential (primary) hypertension: Secondary | ICD-10-CM | POA: Diagnosis not present

## 2016-11-13 DIAGNOSIS — G4700 Insomnia, unspecified: Secondary | ICD-10-CM | POA: Diagnosis not present

## 2016-11-13 DIAGNOSIS — R42 Dizziness and giddiness: Secondary | ICD-10-CM | POA: Diagnosis not present

## 2016-11-13 DIAGNOSIS — J439 Emphysema, unspecified: Secondary | ICD-10-CM

## 2016-11-13 DIAGNOSIS — J449 Chronic obstructive pulmonary disease, unspecified: Secondary | ICD-10-CM | POA: Diagnosis not present

## 2016-11-13 DIAGNOSIS — E1151 Type 2 diabetes mellitus with diabetic peripheral angiopathy without gangrene: Secondary | ICD-10-CM | POA: Diagnosis not present

## 2016-11-13 DIAGNOSIS — E785 Hyperlipidemia, unspecified: Secondary | ICD-10-CM | POA: Diagnosis not present

## 2016-11-13 DIAGNOSIS — D72829 Elevated white blood cell count, unspecified: Secondary | ICD-10-CM | POA: Diagnosis not present

## 2016-11-13 DIAGNOSIS — Z79899 Other long term (current) drug therapy: Secondary | ICD-10-CM | POA: Diagnosis not present

## 2016-11-13 LAB — CBC
HCT: 41.7 % (ref 39.0–52.0)
HEMOGLOBIN: 13.3 g/dL (ref 13.0–17.0)
MCH: 28.1 pg (ref 26.0–34.0)
MCHC: 31.9 g/dL (ref 30.0–36.0)
MCV: 88 fL (ref 78.0–100.0)
Platelets: 287 10*3/uL (ref 150–400)
RBC: 4.74 MIL/uL (ref 4.22–5.81)
RDW: 14.8 % (ref 11.5–15.5)
WBC: 12 10*3/uL — AB (ref 4.0–10.5)

## 2016-11-13 LAB — GLUCOSE, CAPILLARY
GLUCOSE-CAPILLARY: 172 mg/dL — AB (ref 65–99)
Glucose-Capillary: 225 mg/dL — ABNORMAL HIGH (ref 65–99)
Glucose-Capillary: 264 mg/dL — ABNORMAL HIGH (ref 65–99)

## 2016-11-13 LAB — BASIC METABOLIC PANEL
Anion gap: 6 (ref 5–15)
BUN: 9 mg/dL (ref 6–20)
CALCIUM: 9.5 mg/dL (ref 8.9–10.3)
CO2: 29 mmol/L (ref 22–32)
CREATININE: 1.1 mg/dL (ref 0.61–1.24)
Chloride: 104 mmol/L (ref 101–111)
GFR calc non Af Amer: >60 mL/min (ref >60)
Glucose, Bld: 182 mg/dL — ABNORMAL HIGH (ref 65–99)
Potassium: 3.3 mmol/L — ABNORMAL LOW (ref 3.5–5.1)
SODIUM: 139 mmol/L (ref 135–145)

## 2016-11-13 LAB — PHOSPHORUS: PHOSPHORUS: 1.9 mg/dL — AB (ref 2.5–4.6)

## 2016-11-13 LAB — HIV ANTIBODY (ROUTINE TESTING W REFLEX): HIV Screen 4th Generation wRfx: NONREACTIVE

## 2016-11-13 LAB — TROPONIN I: TROPONIN I: 0.03 ng/mL — AB (ref <0.03)

## 2016-11-13 MED ORDER — POTASSIUM CHLORIDE CRYS ER 20 MEQ PO TBCR
40.0000 meq | EXTENDED_RELEASE_TABLET | Freq: Once | ORAL | Status: AC
  Administered 2016-11-13: 40 meq via ORAL
  Filled 2016-11-13: qty 2

## 2016-11-13 MED ORDER — DEXTROSE 5 % IV SOLN
30.0000 mmol | Freq: Once | INTRAVENOUS | Status: AC
  Administered 2016-11-13: 30 mmol via INTRAVENOUS
  Filled 2016-11-13: qty 10

## 2016-11-13 MED ORDER — SODIUM CHLORIDE 0.9 % IV SOLN
INTRAVENOUS | Status: DC
  Administered 2016-11-14: 03:00:00 via INTRAVENOUS

## 2016-11-13 NOTE — Progress Notes (Signed)
PROGRESS NOTE    James Zuniga  HCW:[id number] DOB: 11-23-1955 DOA: 11/12/2016 PCP: Berkley Harvey, NP    Brief Narrative: James Zuniga is a 61 y.o. male with medical history significant of CHF, peripheral vascular disease, memory loss, insomnia, hypokalemia, hypertension, hyperlipidemia, gout, diabetes, coronary artery disease, COPD. Patient presenting with a complaint of generalized weakness and fatigue  Assessment & Plan:   Active Problems:   COPD (chronic obstructive pulmonary disease) (H[facility])   Essential (primary) hypertension   Bradycardia   Generalized weakness   AKI (acute kidney injury) ([place])   Hypophosphatemia   Leukocytosis   Diabetes mellitus with complication ([facility])   Generalized weakness and fatigue:  Probably from bradycardia, hypokalemia and hypophosphatemia. Replete as needed.  Bradycardia improved with replacement of potassium. holding metoprolol. Echocardiogram ordered and still pending.  He reports persistent dizziness for which orthostatics will be obtained, recommend continue with gentle hydration and replace potassium and phosphorus which was still low.  Repeat potassium and phos levels in am.    Acute kidney injury" improved with fluids, probably from dehyration.    DM  CBG (last 3)   Recent Labs  11/12/16 2107 11/13/16 0630 11/13/16 1119  GLUCAP 333* 172* 225*    Resume SSI.    Hypertension:  Well controlled.   COPD: NO wheezing heard. Stable, resume spiriva.        DVT prophylaxis: heparin.  Code Status: full code.  Family Communication: none at bedsid.e  Disposition Plan: pending further eval   Consultants:   None.   Procedures: none.   Antimicrobials: none.    Subjective: Reports feeling dizzy when getting, like he is going to fall.   Objective: Vitals:   11/12/16 2029 11/13/16 0634 11/13/16 0639 11/13/16 0741  BP: 123/86 112/81    Pulse: (!) 106 80    Resp: 18 18    Temp: 98.2 F (36.8 C) 98.1 F (36.7 C)      TempSrc: Oral Oral    SpO2: 96% 94%  94%  Weight:   93.4 kg (205 lb 14.4 oz)   Height:        Intake/Output Summary (Last 24 hours) at 11/13/16 1320 Last data filed at 11/12/16 2206  Gross per 24 hour  Intake             1280 ml  Output             2400 ml  Net            -1120 ml   Filed Weights   11/12/16 1543 11/13/16 0639  Weight: 95.3 kg (210 lb 1.6 oz) 93.4 kg (205 lb 14.4 oz)    Examination:  General exam: Appears calm and comfortable  Respiratory system: Clear to auscultation. Respiratory effort normal. Cardiovascular system: S1 & S2 heard, RRR. No JVD, murmurs, rubs, gallops or clicks. No pedal edema. Gastrointestinal system: Abdomen is nondistended, soft and nontender. No organomegaly or masses felt. Normal bowel sounds heard. Central nervous system: Alert and oriented. No focal neurological deficits. Extremities: Symmetric 5 x 5 power. Skin: No rashes, lesions or ulcers     Data Reviewed: I have personally reviewed following labs and imaging studies  CBC:  Recent Labs Lab 11/12/16 1121 11/13/16 0226  WBC 18.4* 12.0*  NEUTROABS 15.7*  --   HGB 14.7 13.3  HCT 44.4 41.7  MCV 88.4 88.0  PLT 299 176   Basic Metabolic Panel:  Recent Labs Lab 11/12/16 1121 11/12/16 1244 11/12/16 1704 11/12/16 2248  11/13/16 0619  NA 138  --  139 138 139  K <2.0*  --  <2.0* 4.0 3.3*  CL 105  --  104 104 104  CO2 26  --  26 26 29   GLUCOSE 154*  --  147* 285* 182*  BUN 10  --  10 11 9   CREATININE 1.34*  --  1.26* 1.25* 1.10  CALCIUM 10.2  --  10.4* 10.0 9.5  MG  --  1.7  --   --   --   PHOS  --  1.2* 2.2* 2.2* 1.9*   GFR: Estimated Creatinine Clearance: 69.9 mL/min (by C-G formula based on SCr of 1.1 mg/dL). Liver Function Tests:  Recent Labs Lab 11/12/16 1121  AST 20  ALT 16*  ALKPHOS 99  BILITOT 0.5  PROT 7.0  ALBUMIN 3.5   No results for input(s): LIPASE, AMYLASE in the last 168 hours. No results for input(s): AMMONIA in the last 168  hours. Coagulation Profile: No results for input(s): INR, PROTIME in the last 168 hours. Cardiac Enzymes:  Recent Labs Lab 11/12/16 1121 11/12/16 1704 11/12/16 2248 11/13/16 0226  TROPONINI <0.03 0.04* 0.04* 0.03*   BNP (last 3 results) No results for input(s): PROBNP in the last 8760 hours. HbA1C: No results for input(s): HGBA1C in the last 72 hours. CBG:  Recent Labs Lab 11/12/16 1609 11/12/16 2107 11/13/16 0630 11/13/16 1119  GLUCAP 128* 333* 172* 225*   Lipid Profile: No results for input(s): CHOL, HDL, LDLCALC, TRIG, CHOLHDL, LDLDIRECT in the last 72 hours. Thyroid Function Tests:  Recent Labs  11/12/16 1244  TSH 0.356   Anemia Panel: No results for input(s): VITAMINB12, FOLATE, FERRITIN, TIBC, IRON, RETICCTPCT in the last 72 hours. Sepsis Labs: No results for input(s): PROCALCITON, LATICACIDVEN in the last 168 hours.  No results found for this or any previous visit (from the past 240 hour(s)).       Radiology Studies: Dg Chest Portable 1 View  Result Date: 11/12/2016 CLINICAL DATA:  Chest pain and bradycardia EXAM: PORTABLE CHEST 1 VIEW COMPARISON:  09/24/2016 FINDINGS: Mild cardiomegaly. Stable aortic tortuosity, accentuated by lower lung volumes. There is no edema, consolidation, effusion, or pneumothorax. No acute osseous finding. EKG leads create artifact over the chest. IMPRESSION: Stable from prior.  No acute finding. Electronically Signed   By: Monte Fantasia M.D.   On: 11/12/2016 11:39        Scheduled Meds: . allopurinol  300 mg Oral Daily  . aspirin EC  81 mg Oral Daily  . atorvastatin  20 mg Oral q1800  . DULoxetine  60 mg Oral Daily  . gabapentin  300 mg Oral TID  . heparin  5,000 Units Subcutaneous Q8H  . insulin aspart  0-5 Units Subcutaneous QHS  . insulin aspart  0-9 Units Subcutaneous TID WC  . losartan  100 mg Oral Daily  . pantoprazole  40 mg Oral Daily  . primidone  50 mg Oral Daily  . tamsulosin  0.4 mg Oral QPC breakfast   . tiotropium  18 mcg Inhalation Daily  . umeclidinium bromide  1 puff Inhalation Daily  . zolpidem  10 mg Oral QHS   Continuous Infusions:   LOS: 0 days    Time spent: 30 minutes.     Hosie Poisson, MD [facility] Pager [phone]  If 7PM-7AM, please contact night-coverage [email or web address] Password [email or web address] 11/13/2016, 1:20 PM

## 2016-11-14 LAB — GLUCOSE, CAPILLARY
GLUCOSE-CAPILLARY: 208 mg/dL — AB (ref 65–99)
GLUCOSE-CAPILLARY: 221 mg/dL — AB (ref 65–99)
Glucose-Capillary: 215 mg/dL — ABNORMAL HIGH (ref 65–99)
Glucose-Capillary: 185 mg/dL — ABNORMAL HIGH (ref 65–99)

## 2016-11-14 LAB — BASIC METABOLIC PANEL
ANION GAP: 10 (ref 5–15)
BUN: 6 mg/dL (ref 6–20)
CALCIUM: 9 mg/dL (ref 8.9–10.3)
CO2: 29 mmol/L (ref 22–32)
Chloride: 98 mmol/L — ABNORMAL LOW (ref 101–111)
Creatinine, Ser: 1.01 mg/dL (ref 0.61–1.24)
GLUCOSE: 202 mg/dL — AB (ref 65–99)
POTASSIUM: 3 mmol/L — AB (ref 3.5–5.1)
SODIUM: 137 mmol/L (ref 135–145)

## 2016-11-14 LAB — PHOSPHORUS: PHOSPHORUS: 2.9 mg/dL (ref 2.5–4.6)

## 2016-11-14 MED ORDER — LINAGLIPTIN 5 MG PO TABS
5.0000 mg | ORAL_TABLET | Freq: Every day | ORAL | Status: DC
  Administered 2016-11-14 – 2016-11-15 (×2): 5 mg via ORAL
  Filled 2016-11-14 (×2): qty 1

## 2016-11-14 MED ORDER — FLUTICASONE FUROATE-VILANTEROL 100-25 MCG/INH IN AEPB
1.0000 | INHALATION_SPRAY | Freq: Every day | RESPIRATORY_TRACT | Status: DC
  Filled 2016-11-14 (×2): qty 28

## 2016-11-14 MED ORDER — POTASSIUM CHLORIDE CRYS ER 20 MEQ PO TBCR
40.0000 meq | EXTENDED_RELEASE_TABLET | Freq: Three times a day (TID) | ORAL | Status: AC
  Administered 2016-11-14 (×3): 40 meq via ORAL
  Filled 2016-11-14 (×3): qty 2

## 2016-11-14 NOTE — Progress Notes (Signed)
PROGRESS NOTE    James Zuniga  HGD:[id number] DOB: 1955-08-01 DOA: 11/12/2016 PCP: Berkley Harvey, NP    Brief Narrative: James Zuniga is a 61 y.o. male with medical history significant of CHF, peripheral vascular disease, memory loss, insomnia, hypokalemia, hypertension, hyperlipidemia, gout, diabetes, coronary artery disease, COPD. Patient presenting with a complaint of generalized weakness and fatigue  Assessment & Plan:   Active Problems:   COPD (chronic obstructive pulmonary disease) (HCC)   Essential (primary) hypertension   Bradycardia   Generalized weakness   AKI (acute kidney injury) ([place])   Hypophosphatemia   Leukocytosis   Diabetes mellitus with complication (HCC)   Generalized weakness and fatigue:  Probably from bradycardia, hypokalemia and hypophosphatemia. Replete as needed.  Bradycardia improved with replacement of potassium. holding metoprolol. Echocardiogram ordered and still pending.  He reports persistent dizziness , last night,  Orthostatics so far negative. recommend continue with gentle hydration and replace potassium and phosphorus which was still low.  Repeat potassium in am still low. Check mag levels.    Acute kidney injury" improved with fluids, probably from dehyration.    DM  CBG (last 3)   Recent Labs  11/13/16 1624 11/14/16 0554 11/14/16 1113  GLUCAP 264* 208* 221*    Resume SSI.    Hypertension:  Well controlled.   COPD: NO wheezing heard. Stable, resume spiriva.        DVT prophylaxis: heparin.  Code Status: full code.  Family Communication: none at [email or web address]  Disposition Plan: pending further eval   Consultants:   None.   Procedures: none.   Antimicrobials: none.    Subjective: Reports felt dizzy last night.   Objective: Vitals:   11/14/16 0529 11/14/16 0616 11/14/16 0903 11/14/16 1301  BP: 109/71   122/84  Pulse: 75   100  Resp: 19   18  Temp: 98 F (36.7 C)   98.3 F (36.8 C)  TempSrc: Oral   Oral    SpO2: 98%  95% 98%  Weight:  94.1 kg (207 lb 6.4 oz)    Height:        Intake/Output Summary (Last 24 hours) at 11/14/16 1554 Last data filed at 11/14/16 1037  Gross per 24 hour  Intake              240 ml  Output             2825 ml  Net            -2585 ml   Filed Weights   11/12/16 1543 11/13/16 0639 11/14/16 0616  Weight: 95.3 kg (210 lb 1.6 oz) 93.4 kg (205 lb 14.4 oz) 94.1 kg (207 lb 6.4 oz)    Examination:  General exam: Appears calm and comfortable  Respiratory system: Clear to auscultation. Respiratory effort normal. Cardiovascular system: S1 & S2 heard, RRR. No JVD, murmurs, rubs, gallops or clicks. No pedal edema. Gastrointestinal system: Abdomen is nondistended, soft and nontender. No organomegaly or masses felt. Normal bowel sounds heard. Central nervous system: Alert and oriented. No focal neurological deficits. Extremities: Symmetric 5 x 5 power. Skin: No rashes, lesions or ulcers     Data Reviewed: I have personally reviewed following labs and imaging studies  CBC:  Recent Labs Lab 11/12/16 1121 11/13/16 0226  WBC 18.4* 12.0*  NEUTROABS 15.7*  --   HGB 14.7 13.3  HCT 44.4 41.7  MCV 88.4 88.0  PLT 299 962   Basic Metabolic Panel:  Recent Labs Lab 11/12/16 1121  11/12/16 1244 11/12/16 1704 11/12/16 2248 11/13/16 0619 11/14/16 0233  NA 138  --  139 138 139 137  K <2.0*  --  <2.0* 4.0 3.3* 3.0*  CL 105  --  104 104 104 98*  CO2 26  --  26 26 29 29   GLUCOSE 154*  --  147* 285* 182* 202*  BUN 10  --  10 11 9 6   CREATININE 1.34*  --  1.26* 1.25* 1.10 1.01  CALCIUM 10.2  --  10.4* 10.0 9.5 9.0  MG  --  1.7  --   --   --   --   PHOS  --  1.2* 2.2* 2.2* 1.9* 2.9   GFR: Estimated Creatinine Clearance: 76.5 mL/min (by C-G formula based on SCr of 1.01 mg/dL). Liver Function Tests:  Recent Labs Lab 11/12/16 1121  AST 20  ALT 16*  ALKPHOS 99  BILITOT 0.5  PROT 7.0  ALBUMIN 3.5   No results for input(s): LIPASE, AMYLASE in the last 168  hours. No results for input(s): AMMONIA in the last 168 hours. Coagulation Profile: No results for input(s): INR, PROTIME in the last 168 hours. Cardiac Enzymes:  Recent Labs Lab 11/12/16 1121 11/12/16 1704 11/12/16 2248 11/13/16 0226  TROPONINI <0.03 0.04* 0.04* 0.03*   BNP (last 3 results) No results for input(s): PROBNP in the last 8760 hours. HbA1C: No results for input(s): HGBA1C in the last 72 hours. CBG:  Recent Labs Lab 11/13/16 0630 11/13/16 1119 11/13/16 1624 11/14/16 0554 11/14/16 1113  GLUCAP 172* 225* 264* 208* 221*   Lipid Profile: No results for input(s): CHOL, HDL, LDLCALC, TRIG, CHOLHDL, LDLDIRECT in the last 72 hours. Thyroid Function Tests:  Recent Labs  11/12/16 1244  TSH 0.356   Anemia Panel: No results for input(s): VITAMINB12, FOLATE, FERRITIN, TIBC, IRON, RETICCTPCT in the last 72 hours. Sepsis Labs: No results for input(s): PROCALCITON, LATICACIDVEN in the last 168 hours.  No results found for this or any previous visit (from the past 240 hour(s)).       Radiology Studies: No results found.      Scheduled Meds: . allopurinol  300 mg Oral Daily  . aspirin EC  81 mg Oral Daily  . atorvastatin  20 mg Oral q1800  . DULoxetine  60 mg Oral Daily  . fluticasone furoate-vilanterol  1 puff Inhalation Daily  . gabapentin  300 mg Oral TID  . heparin  5,000 Units Subcutaneous Q8H  . insulin aspart  0-5 Units Subcutaneous QHS  . insulin aspart  0-9 Units Subcutaneous TID WC  . linagliptin  5 mg Oral Daily  . losartan  100 mg Oral Daily  . pantoprazole  40 mg Oral Daily  . potassium chloride  40 mEq Oral TID  . primidone  50 mg Oral Daily  . tamsulosin  0.4 mg Oral QPC breakfast  . tiotropium  18 mcg Inhalation Daily  . umeclidinium bromide  1 puff Inhalation Daily  . zolpidem  10 mg Oral QHS   Continuous Infusions:   LOS: 1 day    Time spent: 30 minutes.     Hosie Poisson, MD [facility] Pager [phone]  If  7PM-7AM, please contact night-coverage [email or web address] Password [facility] 11/14/2016, 3:54 PM

## 2016-11-14 NOTE — Progress Notes (Signed)
CBG 320  

## 2016-11-15 ENCOUNTER — Inpatient Hospital Stay (HOSPITAL_COMMUNITY): Payer: PPO

## 2016-11-15 DIAGNOSIS — I509 Heart failure, unspecified: Secondary | ICD-10-CM

## 2016-11-15 LAB — BASIC METABOLIC PANEL
ANION GAP: 11 (ref 5–15)
BUN: 6 mg/dL (ref 6–20)
CALCIUM: 9.8 mg/dL (ref 8.9–10.3)
CO2: 26 mmol/L (ref 22–32)
Chloride: 99 mmol/L — ABNORMAL LOW (ref 101–111)
Creatinine, Ser: 1.2 mg/dL (ref 0.61–1.24)
GFR calc Af Amer: >60 mL/min (ref >60)
GLUCOSE: 280 mg/dL — AB (ref 65–99)
POTASSIUM: 3.8 mmol/L (ref 3.5–5.1)
SODIUM: 136 mmol/L (ref 135–145)

## 2016-11-15 LAB — ECHOCARDIOGRAM COMPLETE
HEIGHTINCHES: 62 in
WEIGHTICAEL: 3315.72 [oz_av]

## 2016-11-15 LAB — MAGNESIUM: MAGNESIUM: 1.7 mg/dL (ref 1.7–2.4)

## 2016-11-15 LAB — GLUCOSE, CAPILLARY: GLUCOSE-CAPILLARY: 142 mg/dL — AB (ref 65–99)
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
## 2016-11-15 NOTE — Progress Notes (Signed)
  Echocardiogram 2D Echocardiogram has been performed.  Darlina Sicilian M 11/15/2016, 2:50 PM

## 2016-11-15 NOTE — Progress Notes (Signed)
James Zuniga to be D/C'd Home per MD order. Discussed with the patient and all questions fully answered.    VVS, Skin clean, dry and intact without evidence of skin break down, no evidence of skin tears noted.  IV catheter discontinued intact. Site without signs and symptoms of complications. Dressing and pressure applied.  An After Visit Summary was printed and given to the patient.  Patient escorted via [place], and D/C home via private auto.  [name]  11/15/2016 6:50 PM

## 2016-11-16 LAB — GLUCOSE, CAPILLARY
Glucose-Capillary: 320 mg/dL — ABNORMAL HIGH (ref 65–99)
Glucose-Capillary: 255 mg/dL — ABNORMAL HIGH (ref 65–99)

## 2016-11-19 DIAGNOSIS — Z09 Encounter for follow-up examination after completed treatment for conditions other than malignant neoplasm: Secondary | ICD-10-CM | POA: Diagnosis not present

## 2016-11-19 DIAGNOSIS — E876 Hypokalemia: Secondary | ICD-10-CM | POA: Diagnosis not present

## 2016-11-19 DIAGNOSIS — I10 Essential (primary) hypertension: Secondary | ICD-10-CM | POA: Diagnosis not present

## 2016-11-19 DIAGNOSIS — E119 Type 2 diabetes mellitus without complications: Secondary | ICD-10-CM | POA: Diagnosis not present

## 2016-11-20 ENCOUNTER — Ambulatory Visit (HOSPITAL_BASED_OUTPATIENT_CLINIC_OR_DEPARTMENT_OTHER): Payer: PPO

## 2016-11-20 VITALS — BP 137/98 | HR 86 | Temp 97.9°F | Resp 16

## 2016-11-20 DIAGNOSIS — E349 Endocrine disorder, unspecified: Secondary | ICD-10-CM

## 2016-11-20 DIAGNOSIS — E291 Testicular hypofunction: Secondary | ICD-10-CM | POA: Diagnosis not present

## 2016-11-20 MED ORDER — TESTOSTERONE CYPIONATE 200 MG/ML IM SOLN
300.0000 mg | INTRAMUSCULAR | Status: DC
  Administered 2016-11-20: 300 mg via INTRAMUSCULAR

## 2016-11-20 MED ORDER — TESTOSTERONE CYPIONATE 200 MG/ML IM SOLN
INTRAMUSCULAR | Status: AC
  Filled 2016-11-20: qty 2

## 2016-11-20 NOTE — Patient Instructions (Signed)
Testosterone injection What is this medicine? TESTOSTERONE (tes TOS ter one) is the main male hormone. It supports normal male development such as muscle growth, facial hair, and deep voice. It is used in males to treat low testosterone levels. This medicine may be used for other purposes; ask your health care provider or pharmacist if you have questions. COMMON BRAND NAME(S): Andro-L.A., Aveed, Delatestryl, Depo-Testosterone, Virilon What should I tell my health care provider before I take this medicine? They need to know if you have any of these conditions: -cancer -diabetes -heart disease -kidney disease -liver disease -lung disease -prostate disease -an unusual or allergic reaction to testosterone, other medicines, foods, dyes, or preservatives -pregnant or trying to get pregnant -breast-feeding How should I use this medicine? This medicine is for injection into a muscle. It is usually given by a health care professional in a hospital or clinic setting. Contact your pediatrician regarding the use of this medicine in children. While this medicine may be prescribed for children as young as 12 years of age for selected conditions, precautions do apply. Overdosage: If you think you have taken too much of this medicine contact a poison control center or emergency room at once. NOTE: This medicine is only for you. Do not share this medicine with others. What if I miss a dose? Try not to miss a dose. Your doctor or health care professional will tell you when your next injection is due. Notify the office if you are unable to keep an appointment. What may interact with this medicine? -medicines for diabetes -medicines that treat or prevent blood clots like warfarin -oxyphenbutazone -propranolol -steroid medicines like prednisone or cortisone This list may not describe all possible interactions. Give your health care provider a list of all the medicines, herbs, non-prescription drugs, or  dietary supplements you use. Also tell them if you smoke, drink alcohol, or use illegal drugs. Some items may interact with your medicine. What should I watch for while using this medicine? Visit your doctor or health care professional for regular checks on your progress. They will need to check the level of testosterone in your blood. This medicine is only approved for use in men who have low levels of testosterone related to certain medical conditions. Heart attacks and strokes have been reported with the use of this medicine. Notify your doctor or health care professional and seek emergency treatment if you develop breathing problems; changes in vision; confusion; chest pain or chest tightness; sudden arm pain; severe, sudden headache; trouble speaking or understanding; sudden numbness or weakness of the face, arm or leg; loss of balance or coordination. Talk to your doctor about the risks and benefits of this medicine. This medicine may affect blood sugar levels. If you have diabetes, check with your doctor or health care professional before you change your diet or the dose of your diabetic medicine. Testosterone injections are not commonly used in women. Women should inform their doctor if they wish to become pregnant or think they might be pregnant. There is a potential for serious side effects to an unborn child. Talk to your health care professional or pharmacist for more information. Talk with your doctor or health care professional about your birth control options while taking this medicine. This drug is banned from use in athletes by most athletic organizations. What side effects may I notice from receiving this medicine? Side effects that you should report to your doctor or health care professional as soon as possible: -allergic reactions like skin rash,   itching or hives, swelling of the face, lips, or tongue -breast enlargement -breathing problems -changes in emotions or moods -deep or  hoarse voice -irregular menstrual periods -signs and symptoms of liver injury like dark yellow or brown urine; general ill feeling or flu-like symptoms; light-colored stools; loss of appetite; nausea; right upper belly pain; unusually weak or tired; yellowing of the eyes or skin -stomach pain -swelling of the ankles, feet, hands -too frequent or persistent erections -trouble passing urine or change in the amount of urine Side effects that usually do not require medical attention (report to your doctor or health care professional if they continue or are bothersome): -acne -change in sex drive or performance -facial hair growth -hair loss -headache This list may not describe all possible side effects. Call your doctor for medical advice about side effects. You may report side effects to FDA at 1-800-FDA-1088. Where should I keep my medicine? Keep out of the reach of children. This medicine can be abused. Keep your medicine in a safe place to protect it from theft. Do not share this medicine with anyone. Selling or giving away this medicine is dangerous and against the law. Store at room temperature between 20 and 25 degrees C (68 and 77 degrees F). Do not freeze. Protect from light. Follow the directions for the product you are prescribed. Throw away any unused medicine after the expiration date. NOTE: This sheet is a summary. It may not cover all possible information. If you have questions about this medicine, talk to your doctor, pharmacist, or health care provider.  2018 Elsevier/Gold Standard (2015-08-03 07:33:55)  

## 2016-11-30 DIAGNOSIS — R0789 Other chest pain: Secondary | ICD-10-CM | POA: Diagnosis not present

## 2016-11-30 DIAGNOSIS — E876 Hypokalemia: Secondary | ICD-10-CM | POA: Diagnosis not present

## 2016-11-30 DIAGNOSIS — I10 Essential (primary) hypertension: Secondary | ICD-10-CM | POA: Diagnosis not present

## 2016-11-30 DIAGNOSIS — R001 Bradycardia, unspecified: Secondary | ICD-10-CM | POA: Diagnosis not present

## 2016-11-30 DIAGNOSIS — I428 Other cardiomyopathies: Secondary | ICD-10-CM | POA: Diagnosis not present

## 2016-11-30 DIAGNOSIS — E785 Hyperlipidemia, unspecified: Secondary | ICD-10-CM | POA: Diagnosis not present

## 2016-12-04 ENCOUNTER — Ambulatory Visit (HOSPITAL_BASED_OUTPATIENT_CLINIC_OR_DEPARTMENT_OTHER): Payer: PPO

## 2016-12-04 VITALS — BP 135/93 | HR 84 | Temp 97.9°F | Resp 18

## 2016-12-04 DIAGNOSIS — E291 Testicular hypofunction: Secondary | ICD-10-CM

## 2016-12-04 DIAGNOSIS — E349 Endocrine disorder, unspecified: Secondary | ICD-10-CM

## 2016-12-04 MED ORDER — TESTOSTERONE CYPIONATE 200 MG/ML IM SOLN
300.0000 mg | INTRAMUSCULAR | Status: DC
  Administered 2016-12-04: 300 mg via INTRAMUSCULAR

## 2016-12-04 MED ORDER — TESTOSTERONE CYPIONATE 200 MG/ML IM SOLN
INTRAMUSCULAR | Status: AC
  Filled 2016-12-04: qty 2

## 2016-12-04 NOTE — Patient Instructions (Signed)
Testosterone injection What is this medicine? TESTOSTERONE (tes TOS ter one) is the main male hormone. It supports normal male development such as muscle growth, facial hair, and deep voice. It is used in males to treat low testosterone levels. This medicine may be used for other purposes; ask your health care provider or pharmacist if you have questions. COMMON BRAND NAME(S): Andro-L.A., Aveed, Delatestryl, Depo-Testosterone, Virilon What should I tell my health care provider before I take this medicine? They need to know if you have any of these conditions: -cancer -diabetes -heart disease -kidney disease -liver disease -lung disease -prostate disease -an unusual or allergic reaction to testosterone, other medicines, foods, dyes, or preservatives -pregnant or trying to get pregnant -breast-feeding How should I use this medicine? This medicine is for injection into a muscle. It is usually given by a health care professional in a hospital or clinic setting. Contact your pediatrician regarding the use of this medicine in children. While this medicine may be prescribed for children as young as 12 years of age for selected conditions, precautions do apply. Overdosage: If you think you have taken too much of this medicine contact a poison control center or emergency room at once. NOTE: This medicine is only for you. Do not share this medicine with others. What if I miss a dose? Try not to miss a dose. Your doctor or health care professional will tell you when your next injection is due. Notify the office if you are unable to keep an appointment. What may interact with this medicine? -medicines for diabetes -medicines that treat or prevent blood clots like warfarin -oxyphenbutazone -propranolol -steroid medicines like prednisone or cortisone This list may not describe all possible interactions. Give your health care provider a list of all the medicines, herbs, non-prescription drugs, or  dietary supplements you use. Also tell them if you smoke, drink alcohol, or use illegal drugs. Some items may interact with your medicine. What should I watch for while using this medicine? Visit your doctor or health care professional for regular checks on your progress. They will need to check the level of testosterone in your blood. This medicine is only approved for use in men who have low levels of testosterone related to certain medical conditions. Heart attacks and strokes have been reported with the use of this medicine. Notify your doctor or health care professional and seek emergency treatment if you develop breathing problems; changes in vision; confusion; chest pain or chest tightness; sudden arm pain; severe, sudden headache; trouble speaking or understanding; sudden numbness or weakness of the face, arm or leg; loss of balance or coordination. Talk to your doctor about the risks and benefits of this medicine. This medicine may affect blood sugar levels. If you have diabetes, check with your doctor or health care professional before you change your diet or the dose of your diabetic medicine. Testosterone injections are not commonly used in women. Women should inform their doctor if they wish to become pregnant or think they might be pregnant. There is a potential for serious side effects to an unborn child. Talk to your health care professional or pharmacist for more information. Talk with your doctor or health care professional about your birth control options while taking this medicine. This drug is banned from use in athletes by most athletic organizations. What side effects may I notice from receiving this medicine? Side effects that you should report to your doctor or health care professional as soon as possible: -allergic reactions like skin rash,   itching or hives, swelling of the face, lips, or tongue -breast enlargement -breathing problems -changes in emotions or moods -deep or  hoarse voice -irregular menstrual periods -signs and symptoms of liver injury like dark yellow or brown urine; general ill feeling or flu-like symptoms; light-colored stools; loss of appetite; nausea; right upper belly pain; unusually weak or tired; yellowing of the eyes or skin -stomach pain -swelling of the ankles, feet, hands -too frequent or persistent erections -trouble passing urine or change in the amount of urine Side effects that usually do not require medical attention (report to your doctor or health care professional if they continue or are bothersome): -acne -change in sex drive or performance -facial hair growth -hair loss -headache This list may not describe all possible side effects. Call your doctor for medical advice about side effects. You may report side effects to FDA at 1-800-FDA-1088. Where should I keep my medicine? Keep out of the reach of children. This medicine can be abused. Keep your medicine in a safe place to protect it from theft. Do not share this medicine with anyone. Selling or giving away this medicine is dangerous and against the law. Store at room temperature between 20 and 25 degrees C (68 and 77 degrees F). Do not freeze. Protect from light. Follow the directions for the product you are prescribed. Throw away any unused medicine after the expiration date. NOTE: This sheet is a summary. It may not cover all possible information. If you have questions about this medicine, talk to your doctor, pharmacist, or health care provider.  2018 Elsevier/Gold Standard (2015-08-03 07:33:55)  

## 2016-12-05 DIAGNOSIS — J441 Chronic obstructive pulmonary disease with (acute) exacerbation: Secondary | ICD-10-CM | POA: Diagnosis not present

## 2016-12-08 DIAGNOSIS — G629 Polyneuropathy, unspecified: Secondary | ICD-10-CM | POA: Diagnosis not present

## 2016-12-08 DIAGNOSIS — E119 Type 2 diabetes mellitus without complications: Secondary | ICD-10-CM | POA: Diagnosis not present

## 2016-12-09 DIAGNOSIS — H5340 Unspecified visual field defects: Secondary | ICD-10-CM | POA: Diagnosis not present

## 2016-12-09 DIAGNOSIS — H40013 Open angle with borderline findings, low risk, bilateral: Secondary | ICD-10-CM | POA: Diagnosis not present

## 2016-12-09 DIAGNOSIS — E119 Type 2 diabetes mellitus without complications: Secondary | ICD-10-CM | POA: Diagnosis not present

## 2016-12-11 ENCOUNTER — Ambulatory Visit: Payer: Self-pay

## 2016-12-11 ENCOUNTER — Ambulatory Visit: Payer: Self-pay | Admitting: Hematology & Oncology

## 2016-12-11 ENCOUNTER — Other Ambulatory Visit: Payer: Self-pay

## 2016-12-15 ENCOUNTER — Ambulatory Visit (INDEPENDENT_AMBULATORY_CARE_PROVIDER_SITE_OTHER): Payer: PPO | Admitting: Orthopaedic Surgery

## 2016-12-15 DIAGNOSIS — R0789 Other chest pain: Secondary | ICD-10-CM | POA: Diagnosis not present

## 2016-12-15 DIAGNOSIS — J449 Chronic obstructive pulmonary disease, unspecified: Secondary | ICD-10-CM | POA: Diagnosis not present

## 2016-12-16 ENCOUNTER — Ambulatory Visit (INDEPENDENT_AMBULATORY_CARE_PROVIDER_SITE_OTHER): Payer: PPO | Admitting: Orthopaedic Surgery

## 2016-12-16 ENCOUNTER — Encounter (INDEPENDENT_AMBULATORY_CARE_PROVIDER_SITE_OTHER): Payer: Self-pay | Admitting: Orthopaedic Surgery

## 2016-12-16 DIAGNOSIS — M25562 Pain in left knee: Secondary | ICD-10-CM

## 2016-12-16 DIAGNOSIS — G8929 Other chronic pain: Secondary | ICD-10-CM

## 2016-12-16 MED ORDER — DIAZEPAM 5 MG PO TABS: ORAL_TABLET | ORAL | 0 refills | Status: DC

## 2016-12-16 NOTE — Progress Notes (Signed)
Office Visit Note   Patient: James Zuniga           Date of Birth: November 09, 1955           MRN: [id number] Visit Date: 12/16/2016              Requested by: Berkley Harvey, NP [id number], [place] [address] PCP: Berkley Harvey, NP   Assessment & Plan: Visit Diagnoses:  1. Chronic pain of left knee     Plan: We'll proceed with an MRI scan of his left needed chronic knee pain and failure to respond with cortisone injection except for 3 weeks. He rates his pain as severe is not a candidate for anti-inflammatories due to his heart failure. Office follow-up after left knee MRI.  Follow-Up Instructions: No Follow-up on file.   Orders:  Orders Placed This Encounter  Procedures  . MR Knee Left w/o contrast  . Uric acid   Meds ordered this encounter  Medications  . diazepam (VALIUM) 5 MG tablet    Sig: Take as directed prior to MRI    Dispense:  4 tablet    Refill:  0      Procedures: No procedures performed   Clinical Data: No additional findings.   Subjective: Chief Complaint  Patient presents with  . Left Knee - Pain  . Right Knee - Pain    HPI 61 year old male returns with ongoing pain in his left knee. Previous injection 5-18 and states he got good relief for 3 weeks and now he states the pain is back in its severe. He's had previous right knee arthroscopy by Dr. Marlou Sa in the past. His knee has been catching and states it's been locking on him particularly when he gets from sitting to standing. He was recently admitted to the hospital for heart failure. He has a history of gout and has not had a uric acid in many years. Knee has been swollen and he has difficulty primarily getting from sitting to standing and has to be lifted by his wife at times to get upright position. He rates his pain as severe. Opposite right knee bothers him slightly and he is pain mature with a cane due to left knee pain.  Review of Systems 14 point review of systems is updated  positive for history of heart failure and gout. Review of systems is updated and is unchanged from last office visit other than as mentioned in history of present illness.   Objective: Vital Signs: BP (!) 143/98   Pulse 88   Ht 5\' 6"  (1.676 m)   Wt 201 lb (91.2 kg)   BMI 32.44 kg/m   Physical Exam  Constitutional: He is oriented to person, place, and time. He appears well-developed and well-nourished.  HENT:  Head: Normocephalic and atraumatic.  Eyes: EOM are normal. Pupils are equal, round, and reactive to light.  Neck: No tracheal deviation present. No thyromegaly present.  Cardiovascular: Normal rate.   Pulmonary/Chest: Effort normal. He has no wheezes.  Abdominal: Soft. Bowel sounds are normal.  Musculoskeletal:  Patient has negative straight leg raising 90. There is trace left knee effusion. He has medial lateral joint line tenderness. No palpable Baker's cyst. Distal pulses palpable. The pain with hip range of motion right or left. Anterior tib and ankle plantar flexion is strong no sensory deficit. Collateral and cruciate ligament exam right and left knee is normal. Well-healed arthroscopic portal scars right knee. Iliotibial band is normal  pelvis bursa is normal.  Neurological: He is alert and oriented to person, place, and time.  Skin: Skin is warm and dry. Capillary refill takes less than 2 seconds.  Psychiatric: He has a normal mood and affect. His behavior is normal. Judgment and thought content normal.    Ortho Exam  Specialty Comments:  No specialty comments available.  Imaging: Ct Head Code Stroke W/o Cm  Result Date: 12/22/2016 CLINICAL DATA:  Code stroke.  Code stroke, left-sided weakness EXAM: CT HEAD WITHOUT CONTRAST TECHNIQUE: Contiguous axial images were obtained from the base of the skull through the vertex without intravenous contrast. COMPARISON:  CT head 09/16/2016 FINDINGS: Brain: Negative for acute infarct, hemorrhage, mass. Ventricle size normal. Patchy  hypodensity in the cerebral white matter bilaterally appears chronic. Vascular: Negative for hyperdense vessel. Skull: Negative Sinuses/Orbits: Mild mucosal edema in the frontal sinuses. Remaining sinuses clear. Normal orbit. Other: None ASPECTS ([place] Stroke Program Early CT Score) - Ganglionic level infarction (caudate, lentiform nuclei, internal capsule, insula, M1-M3 cortex): 7 - Supraganglionic infarction (M4-M6 cortex): 3 Total score (0-10 with 10 being normal): 10 IMPRESSION: 1. No acute intracranial abnormality 2. ASPECTS is 10 Electronically Signed   By: Franchot Gallo M.D.   On: 12/22/2016 11:45     PMFS History: Patient Active Problem List   Diagnosis Date Noted  . Chronic pain of left knee 12/22/2016  . Bradycardia 11/12/2016  . Generalized weakness 11/12/2016  . AKI (acute kidney injury) ([place]) 11/12/2016  . Hypophosphatemia 11/12/2016  . Leukocytosis 11/12/2016  . Diabetes mellitus with complication ([place]) 35/36/1443  . At risk for falling 09/05/2015  . Chronic respiratory failure ([place]) 07/22/2015  . COLD (chronic obstructive lung disease) ([place]) 11/12/2014  . Dyspnea and respiratory abnormality 11/12/2014  . Lung nodule 04/02/2014  . Smoking 04/02/2014  . Hypokalemia 12/15/2013  . Diabetes mellitus, type 2 ([place]) 09/04/2013  . Essential (primary) hypertension 09/04/2013  . Absolute anemia 09/01/2013  . Chronic pain 08/16/2013  . CAFL (chronic airflow limitation) ([place]) 08/16/2013  . Chronic obstructive pulmonary disease ([place]) 08/16/2013  . Adiposity 08/11/2013  . Peripheral blood vessel disorder ([place]) 07/03/2013  . Cannot sleep 07/03/2013  . Peripheral vascular disease ([place]) 07/03/2013  . Iron deficiency anemia 07/01/2013  . Hypotestosteronism 07/01/2013  . 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 Compulsive tobacco user syndrome 06/01/2013  . Current tobacco use 06/01/2013  . Pulmonary nodule, right 35mm Aug 2014 03/25/2013  . Liver lesion 03/25/2013    . Abnormal magnetic resonance imaging study 03/21/2013  . Disease of liver 03/15/2013  . 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 Cancer screening 10/18/2012  . COPD (chronic obstructive pulmonary disease) ([place]) 08/11/2012  . Adverse reaction to bacterial vaccine 06/17/2012  . 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 Body mass index 27.0-27.9, adult 01/04/2012  . Emphysema 11/23/2011  . Chronic gout 11/23/2011  . Congestive heart failure ([place]) 11/23/2011  . Hypertension 11/23/2011  . Hyperlipidemia 11/23/2011  . Chronic bronchitis 11/23/2011  . Type 2 diabetes mellitus ([place]) 11/23/2011  . Chronic back pain 11/23/2011  . Osteoarthritis of left knee 11/23/2011  . Avitaminosis D 11/05/2011  . Gout 11/05/2011  . Cardiomyopathy, idiopathic ([place]) 11/04/2011  . Nondependent alcohol abuse 11/04/2011  . Amnesia 11/04/2011  . Acid reflux 11/04/2011  . Arthropathia 11/04/2011  . Primary cardiomyopathy ([place]) 11/04/2011   Past Medical History:  Diagnosis Date  . CHF (congestive heart failure) ([place])   . Chronic airway obstruction ([place])   . Chronic pain   . Coronary  artery disease   . Diabetes mellitus   . Disorder of liver   . Esophageal reflux   . Gout   . Hyperlipidemia   . Hypertension   . Hypokalemia   . Hypokalemia 11/2016  . Hypotestosteronism 07/01/2013  . Insomnia   . Iron deficiency anemia, unspecified 07/01/2013  . Memory loss   . PVD (peripheral vascular disease) ([place])   . Renal disorder   . Vitamin D deficiency     Family History  Problem Relation Age of Onset  . Heart disease Mother   . Diabetes Father   . Stroke Brother     Past Surgical History:  Procedure Laterality Date  . KNEE ARTHROSCOPY    . NO PAST SURGERIES     Social History   Occupational History  . Not on file.   Social History Main Topics  . Smoking status: Former Smoker    Packs/day: 1.00    Years: 30.00    Types: Cigarettes    Start date: 08/05/1983    Quit date: 10/11/2016  . Smokeless  tobacco: Never Used     Comment: quit smoking 4 months ago  . Alcohol use No  . Drug use: No  . Sexual activity: Not on file

## 2016-12-17 ENCOUNTER — Other Ambulatory Visit: Payer: Self-pay | Admitting: *Deleted

## 2016-12-17 DIAGNOSIS — D500 Iron deficiency anemia secondary to blood loss (chronic): Secondary | ICD-10-CM

## 2016-12-17 LAB — URIC ACID: URIC ACID, SERUM: 3.8 mg/dL — AB (ref 4.0–8.0)

## 2016-12-18 ENCOUNTER — Ambulatory Visit (HOSPITAL_BASED_OUTPATIENT_CLINIC_OR_DEPARTMENT_OTHER): Payer: PPO

## 2016-12-18 ENCOUNTER — Other Ambulatory Visit (HOSPITAL_BASED_OUTPATIENT_CLINIC_OR_DEPARTMENT_OTHER): Payer: PPO

## 2016-12-18 ENCOUNTER — Ambulatory Visit (HOSPITAL_BASED_OUTPATIENT_CLINIC_OR_DEPARTMENT_OTHER): Payer: PPO | Admitting: Hematology & Oncology

## 2016-12-18 VITALS — BP 156/107 | HR 84 | Temp 98.2°F | Resp 17 | Wt 211.0 lb

## 2016-12-18 DIAGNOSIS — E291 Testicular hypofunction: Secondary | ICD-10-CM

## 2016-12-18 DIAGNOSIS — D509 Iron deficiency anemia, unspecified: Secondary | ICD-10-CM

## 2016-12-18 DIAGNOSIS — K769 Liver disease, unspecified: Secondary | ICD-10-CM

## 2016-12-18 DIAGNOSIS — D500 Iron deficiency anemia secondary to blood loss (chronic): Secondary | ICD-10-CM | POA: Diagnosis not present

## 2016-12-18 DIAGNOSIS — I5022 Chronic systolic (congestive) heart failure: Secondary | ICD-10-CM

## 2016-12-18 DIAGNOSIS — E349 Endocrine disorder, unspecified: Secondary | ICD-10-CM

## 2016-12-18 LAB — IRON AND TIBC
%SAT: 19 % — AB (ref 20–55)
IRON: 50 ug/dL (ref 42–163)
TIBC: 264 ug/dL (ref 202–409)
UIBC: 214 ug/dL (ref 117–376)

## 2016-12-18 LAB — CBC WITH DIFFERENTIAL (CANCER CENTER ONLY)
BASO#: 0 10*3/uL (ref 0.0–0.2)
BASO%: 0.4 % (ref 0.0–2.0)
EOS%: 2.8 % (ref 0.0–7.0)
Eosinophils Absolute: 0.2 10*3/uL (ref 0.0–0.5)
HCT: 46.5 % (ref 38.7–49.9)
HEMOGLOBIN: 15.2 g/dL (ref 13.0–17.1)
LYMPH#: 2 10*3/uL (ref 0.9–3.3)
LYMPH%: 26.6 % (ref 14.0–48.0)
MCH: 29.6 pg (ref 28.0–33.4)
MCHC: 32.7 g/dL (ref 32.0–35.9)
MCV: 91 fL (ref 82–98)
MONO#: 0.4 10*3/uL (ref 0.1–0.9)
MONO%: 5.2 % (ref 0.0–13.0)
NEUT%: 65 % (ref 40.0–80.0)
NEUTROS ABS: 4.8 10*3/uL (ref 1.5–6.5)
Platelets: 291 10*3/uL (ref 145–400)
RBC: 5.13 10*6/uL (ref 4.20–5.70)
RDW: 14.7 % (ref 11.1–15.7)
WBC: 7.4 10*3/uL (ref 4.0–10.0)

## 2016-12-18 LAB — FERRITIN: FERRITIN: 180 ng/mL (ref 22–316)

## 2016-12-18 MED ORDER — TESTOSTERONE CYPIONATE 200 MG/ML IM SOLN
INTRAMUSCULAR | Status: AC
  Filled 2016-12-18: qty 2

## 2016-12-18 MED ORDER — TESTOSTERONE CYPIONATE 200 MG/ML IM SOLN
300.0000 mg | INTRAMUSCULAR | Status: DC
  Administered 2016-12-18: 300 mg via INTRAMUSCULAR

## 2016-12-18 NOTE — Patient Instructions (Signed)
Testosterone injection What is this medicine? TESTOSTERONE (tes TOS ter one) is the main male hormone. It supports normal male development such as muscle growth, facial hair, and deep voice. It is used in males to treat low testosterone levels. This medicine may be used for other purposes; ask your health care provider or pharmacist if you have questions. COMMON BRAND NAME(S): Andro-L.A., Aveed, Delatestryl, Depo-Testosterone, Virilon What should I tell my health care provider before I take this medicine? They need to know if you have any of these conditions: -cancer -diabetes -heart disease -kidney disease -liver disease -lung disease -prostate disease -an unusual or allergic reaction to testosterone, other medicines, foods, dyes, or preservatives -pregnant or trying to get pregnant -breast-feeding How should I use this medicine? This medicine is for injection into a muscle. It is usually given by a health care professional in a hospital or clinic setting. Contact your pediatrician regarding the use of this medicine in children. While this medicine may be prescribed for children as young as 12 years of age for selected conditions, precautions do apply. Overdosage: If you think you have taken too much of this medicine contact a poison control center or emergency room at once. NOTE: This medicine is only for you. Do not share this medicine with others. What if I miss a dose? Try not to miss a dose. Your doctor or health care professional will tell you when your next injection is due. Notify the office if you are unable to keep an appointment. What may interact with this medicine? -medicines for diabetes -medicines that treat or prevent blood clots like warfarin -oxyphenbutazone -propranolol -steroid medicines like prednisone or cortisone This list may not describe all possible interactions. Give your health care provider a list of all the medicines, herbs, non-prescription drugs, or  dietary supplements you use. Also tell them if you smoke, drink alcohol, or use illegal drugs. Some items may interact with your medicine. What should I watch for while using this medicine? Visit your doctor or health care professional for regular checks on your progress. They will need to check the level of testosterone in your blood. This medicine is only approved for use in men who have low levels of testosterone related to certain medical conditions. Heart attacks and strokes have been reported with the use of this medicine. Notify your doctor or health care professional and seek emergency treatment if you develop breathing problems; changes in vision; confusion; chest pain or chest tightness; sudden arm pain; severe, sudden headache; trouble speaking or understanding; sudden numbness or weakness of the face, arm or leg; loss of balance or coordination. Talk to your doctor about the risks and benefits of this medicine. This medicine may affect blood sugar levels. If you have diabetes, check with your doctor or health care professional before you change your diet or the dose of your diabetic medicine. Testosterone injections are not commonly used in women. Women should inform their doctor if they wish to become pregnant or think they might be pregnant. There is a potential for serious side effects to an unborn child. Talk to your health care professional or pharmacist for more information. Talk with your doctor or health care professional about your birth control options while taking this medicine. This drug is banned from use in athletes by most athletic organizations. What side effects may I notice from receiving this medicine? Side effects that you should report to your doctor or health care professional as soon as possible: -allergic reactions like skin rash,   itching or hives, swelling of the face, lips, or tongue -breast enlargement -breathing problems -changes in emotions or moods -deep or  hoarse voice -irregular menstrual periods -signs and symptoms of liver injury like dark yellow or brown urine; general ill feeling or flu-like symptoms; light-colored stools; loss of appetite; nausea; right upper belly pain; unusually weak or tired; yellowing of the eyes or skin -stomach pain -swelling of the ankles, feet, hands -too frequent or persistent erections -trouble passing urine or change in the amount of urine Side effects that usually do not require medical attention (report to your doctor or health care professional if they continue or are bothersome): -acne -change in sex drive or performance -facial hair growth -hair loss -headache This list may not describe all possible side effects. Call your doctor for medical advice about side effects. You may report side effects to FDA at 1-800-FDA-1088. Where should I keep my medicine? Keep out of the reach of children. This medicine can be abused. Keep your medicine in a safe place to protect it from theft. Do not share this medicine with anyone. Selling or giving away this medicine is dangerous and against the law. Store at room temperature between 20 and 25 degrees C (68 and 77 degrees F). Do not freeze. Protect from light. Follow the directions for the product you are prescribed. Throw away any unused medicine after the expiration date. NOTE: This sheet is a summary. It may not cover all possible information. If you have questions about this medicine, talk to your doctor, pharmacist, or health care provider.  2018 Elsevier/Gold Standard (2015-08-03 07:33:55)  

## 2016-12-18 NOTE — Progress Notes (Signed)
Hematology and Oncology Follow Up Visit  James Zuniga [id number] 04-13-1956 61 y.o. 12/18/2016   Principle Diagnosis:   Iron deficiency anemia  hypotestosteronemia  Chronic liver lesions  Current Therapy:    IV iron as indicated  Depo- testosterone 300mg  IM Q2 weeks     Interim History:  [name] is back for followup. He actually looks fairly good. He acts and was hospitalized back in early May. He was discharged on May 6. It looks like he may have had some cardiac issues. He was bradycardic. He had hypokalemia. The bradycardia may have been from metoprolol. He did have an echocardiogram done. His ejection fraction was only 35-80%. He had some regional hypokinesis. He is now seen a cardiologist.  He is not smoking. His blood sugars have been doing okay.  He does get his testosterone injections. This has helped him.  Back in March, his testosterone level was 194. Without supplemental testosterone, he really feels tired and fatigued.   His iron studies back in March showed a ferritin of 260 with an iron saturation of 18%. He did get a dose of IV Feraheme. This is back in early April.   He may need have knee surgery now. He has a lot of knee issues. It sounds like he is going have an MRI of his knee. Hopefully this will allow him to have a little bit better quality of life.   Overall, his performance status is ECOG 2.  Medications:  Current Outpatient Prescriptions:  .  albuterol (PROVENTIL HFA;VENTOLIN HFA) 108 (90 Base) MCG/ACT inhaler, Inhale 1-2 puffs into the lungs every 6 (six) hours as needed for wheezing., Disp: 1 Inhaler, Rfl: 0 .  albuterol (PROVENTIL) (2.5 MG/3ML) 0.083% nebulizer solution, Take 2.5 mg by nebulization every 6 (six) hours as needed for wheezing or shortness of breath., Disp: , Rfl:  .  allopurinol (ZYLOPRIM) 300 MG tablet, Take 300 mg by mouth daily., Disp: , Rfl:  .  aspirin 81 MG EC tablet, Take 81 mg by mouth daily. Swallow whole., Disp: , Rfl:  .   atorvastatin (LIPITOR) 20 MG tablet, Take 20 mg by mouth every morning. , Disp: , Rfl:  .  BREO ELLIPTA 100-25 MCG/INH AEPB, INL 1 PUFF PO D (Patient taking differently: Inhale 1 puff into the lungs daily. ), Disp: 1 each, Rfl: 0 .  diazepam (VALIUM) 5 MG tablet, Take as directed prior to MRI, Disp: 4 tablet, Rfl: 0 .  Dulaglutide 0.75 MG/0.5ML SOPN, Inject 0.75 mg into the skin., Disp: , Rfl:  .  DULoxetine (CYMBALTA) 60 MG capsule, Take 60 mg by mouth daily., Disp: , Rfl:  .  Fluticasone-Umeclidin-Vilant 100-62.5-25 MCG/INH AEPB, Inhale 1 puff into the lungs daily., Disp: , Rfl:  .  furosemide (LASIX) 20 MG tablet, Take 20 mg by mouth 2 (two) times daily. , Disp: , Rfl:  .  gabapentin (NEURONTIN) 300 MG capsule, Take 300 mg by mouth 3 (three) times daily. , Disp: , Rfl:  .  linagliptin (TRADJENTA) 5 MG TABS tablet, Take 5 mg by mouth., Disp: , Rfl:  .  losartan (COZAAR) 100 MG tablet, Take 100 mg by mouth daily. , Disp: , Rfl:  .  omeprazole (PRILOSEC) 20 MG capsule, Take 20 mg by mouth daily. , Disp: , Rfl: 0 .  OXYGEN, Inhale 2 L into the lungs as needed., Disp: , Rfl:  .  potassium chloride SA (K-DUR,KLOR-CON) 20 MEQ tablet, Take 1 tablet (20 mEq total) by mouth 2 (two)  times daily., Disp: 30 tablet, Rfl: 3 .  primidone (MYSOLINE) 50 MG tablet, Take 50 mg by mouth daily. , Disp: , Rfl:  .  tamsulosin (FLOMAX) 0.4 MG CAPS capsule, Take 1 capsule (0.4 mg total) by mouth daily after breakfast., Disp: 30 capsule, Rfl: 12 .  testosterone cypionate (DEPOTESTOSTERONE CYPIONATE) 200 MG/ML injection, Inject 200 mg into the muscle every 14 (fourteen) days. , Disp: , Rfl:  .  tiotropium (SPIRIVA) 18 MCG inhalation capsule, Place 1 capsule (18 mcg total) into inhaler and inhale daily., Disp: 30 capsule, Rfl: 6 .  tiZANidine (ZANAFLEX) 4 MG tablet, 4 mg 3 (three) times daily as needed for muscle spasms. , Disp: , Rfl: 0 .  traMADol (ULTRAM) 50 MG tablet, Take 50 mg by mouth daily as needed for moderate  pain. , Disp: , Rfl:  .  umeclidinium bromide (INCRUSE ELLIPTA) 62.5 MCG/INH AEPB, Inhale 1 puff into the lungs daily., Disp: 1 each, Rfl: 0 .  zolpidem (AMBIEN) 10 MG tablet, Take 10 mg by mouth at bedtime. , Disp: , Rfl:   Current Facility-Administered Medications:  .  testosterone cypionate (DEPOTESTOSTERONE CYPIONATE) injection 300 mg, 300 mg, Intramuscular, Q14 Days, [place], Holli Humbles, NP  Allergies: No Known Allergies  Past Medical History, Surgical history, Social history, and Family History were reviewed and updated.  Review of Systems: As above  Physical Exam:  weight is 211 lb (95.7 kg). His oral temperature is 98.2 F (36.8 C). His blood pressure is 156/107 (abnormal) and his pulse is 84. His respiration is 17 and oxygen saturation is 97%.   Well-developed and well-nourished African American gentleman. Head and neck exam shows no ocular or oral lesions. He does have a bit of firmness and slight tenderness in about a centimeter area just to the left of the angle of his mouth. This is where he has this abscess. He has no adenopathy. Lungs are clear. Cardiac exam is regular rate and rhythm. There are no murmurs, rubs or bruits. Abdomen is soft. Has good bowel sounds. There is no fluid wave. There is no palpable liver or spleen tip. Back exam shows no tenderness over the spine ribs or hips. Extremities shows no clubbing, cyanosis or edema. He has a knee brace on the left knee. He has some slight swelling in the left leg. Skin exam shows no rashes, ecchymoses or petechia. Neurological exam is nonfocal.  Lab Results  Component Value Date   WBC 7.4 12/18/2016   HGB 15.2 12/18/2016   HCT 46.5 12/18/2016   MCV 91 12/18/2016   PLT 291 12/18/2016     Chemistry      Component Value Date/Time   NA 136 11/15/2016 0907   NA 141 09/25/2016 0809   K 3.8 11/15/2016 0907   K 2.8 (LL) 09/25/2016 0809   CL 99 (L) 11/15/2016 0907   CL 102 07/31/2016 0815   CO2 26 11/15/2016 0907   CO2 28  09/25/2016 0809   BUN 6 11/15/2016 0907   BUN 13.1 09/25/2016 0809   CREATININE 1.20 11/15/2016 0907   CREATININE 1.2 09/25/2016 0809      Component Value Date/Time   CALCIUM 9.8 11/15/2016 0907   CALCIUM 9.5 09/25/2016 0809   ALKPHOS 99 11/12/2016 1121   ALKPHOS 126 09/25/2016 0809   AST 20 11/12/2016 1121   AST 19 09/25/2016 0809   ALT 16 (L) 11/12/2016 1121   ALT 23 09/25/2016 0809   BILITOT 0.5 11/12/2016 1121   BILITOT 0.45 09/25/2016 0809  Impression and Plan: Mr. Breeden is 61 year old African-American male. He has multifactorial anemia. We have taken care of this nicely. He gets IV iron on occasion . I would think that his iron levels should be okay.  From my point of view, I don't see any problem with him having knee surgery. If this will help his quality of life, he should have it done.  I told him that if he is going to have knee surgery, that his blood sugars have to get in good control. If not, then he will have discharged her difficulties healing and this will tend to cause more problems for him.   We will continue his testosterone every 2 weeks. Parafalcine back in 6 weeks.  Nile Riggs, MD 6/8/20188:55 AM

## 2016-12-19 LAB — TESTOSTERONE: TESTOSTERONE: 574 ng/dL (ref 264–916)

## 2016-12-22 ENCOUNTER — Inpatient Hospital Stay (HOSPITAL_COMMUNITY): Payer: PPO

## 2016-12-22 ENCOUNTER — Encounter (HOSPITAL_COMMUNITY): Payer: Self-pay | Admitting: Emergency Medicine

## 2016-12-22 ENCOUNTER — Other Ambulatory Visit: Payer: Self-pay

## 2016-12-22 ENCOUNTER — Emergency Department (HOSPITAL_COMMUNITY): Payer: PPO

## 2016-12-22 ENCOUNTER — Inpatient Hospital Stay (HOSPITAL_COMMUNITY)
Admission: EM | Admit: 2016-12-22 | Discharge: 2016-12-25 | DRG: 065 | Disposition: A | Discharge status: Home / Self Care | Payer: PPO | Attending: Internal Medicine | Admitting: Internal Medicine

## 2016-12-22 DIAGNOSIS — Z87891 Personal history of nicotine dependence: Secondary | ICD-10-CM

## 2016-12-22 DIAGNOSIS — K219 Gastro-esophageal reflux disease without esophagitis: Secondary | ICD-10-CM | POA: Diagnosis present

## 2016-12-22 DIAGNOSIS — R531 Weakness: Secondary | ICD-10-CM

## 2016-12-22 DIAGNOSIS — I110 Hypertensive heart disease with heart failure: Secondary | ICD-10-CM | POA: Diagnosis present

## 2016-12-22 DIAGNOSIS — G8929 Other chronic pain: Secondary | ICD-10-CM | POA: Insufficient documentation

## 2016-12-22 DIAGNOSIS — R29810 Facial weakness: Secondary | ICD-10-CM | POA: Diagnosis not present

## 2016-12-22 DIAGNOSIS — G4733 Obstructive sleep apnea (adult) (pediatric): Secondary | ICD-10-CM | POA: Diagnosis not present

## 2016-12-22 DIAGNOSIS — J449 Chronic obstructive pulmonary disease, unspecified: Secondary | ICD-10-CM | POA: Diagnosis not present

## 2016-12-22 DIAGNOSIS — Z8249 Family history of ischemic heart disease and other diseases of the circulatory system: Secondary | ICD-10-CM

## 2016-12-22 DIAGNOSIS — Z7982 Long term (current) use of aspirin: Secondary | ICD-10-CM

## 2016-12-22 DIAGNOSIS — I429 Cardiomyopathy, unspecified: Secondary | ICD-10-CM | POA: Diagnosis not present

## 2016-12-22 DIAGNOSIS — Z833 Family history of diabetes mellitus: Secondary | ICD-10-CM

## 2016-12-22 DIAGNOSIS — Z6834 Body mass index (BMI) 34.0-34.9, adult: Secondary | ICD-10-CM

## 2016-12-22 DIAGNOSIS — E11649 Type 2 diabetes mellitus with hypoglycemia without coma: Secondary | ICD-10-CM | POA: Diagnosis not present

## 2016-12-22 DIAGNOSIS — I639 Cerebral infarction, unspecified: Secondary | ICD-10-CM | POA: Diagnosis not present

## 2016-12-22 DIAGNOSIS — I63319 Cerebral infarction due to thrombosis of unspecified middle cerebral artery: Secondary | ICD-10-CM | POA: Diagnosis not present

## 2016-12-22 DIAGNOSIS — E119 Type 2 diabetes mellitus without complications: Secondary | ICD-10-CM

## 2016-12-22 DIAGNOSIS — E785 Hyperlipidemia, unspecified: Secondary | ICD-10-CM | POA: Diagnosis not present

## 2016-12-22 DIAGNOSIS — I6789 Other cerebrovascular disease: Secondary | ICD-10-CM | POA: Diagnosis not present

## 2016-12-22 DIAGNOSIS — I63411 Cerebral infarction due to embolism of right middle cerebral artery: Principal | ICD-10-CM | POA: Diagnosis present

## 2016-12-22 DIAGNOSIS — I638 Other cerebral infarction: Secondary | ICD-10-CM | POA: Diagnosis not present

## 2016-12-22 DIAGNOSIS — Z9981 Dependence on supplemental oxygen: Secondary | ICD-10-CM

## 2016-12-22 DIAGNOSIS — E669 Obesity, unspecified: Secondary | ICD-10-CM | POA: Diagnosis not present

## 2016-12-22 DIAGNOSIS — N138 Other obstructive and reflux uropathy: Secondary | ICD-10-CM | POA: Diagnosis present

## 2016-12-22 DIAGNOSIS — R29704 NIHSS score 4: Secondary | ICD-10-CM | POA: Diagnosis present

## 2016-12-22 DIAGNOSIS — I493 Ventricular premature depolarization: Secondary | ICD-10-CM | POA: Diagnosis not present

## 2016-12-22 DIAGNOSIS — M549 Dorsalgia, unspecified: Secondary | ICD-10-CM | POA: Diagnosis not present

## 2016-12-22 DIAGNOSIS — G8194 Hemiplegia, unspecified affecting left nondominant side: Secondary | ICD-10-CM | POA: Diagnosis not present

## 2016-12-22 DIAGNOSIS — I509 Heart failure, unspecified: Secondary | ICD-10-CM

## 2016-12-22 DIAGNOSIS — M109 Gout, unspecified: Secondary | ICD-10-CM | POA: Diagnosis present

## 2016-12-22 DIAGNOSIS — I6349 Cerebral infarction due to embolism of other cerebral artery: Secondary | ICD-10-CM | POA: Diagnosis not present

## 2016-12-22 DIAGNOSIS — I63019 Cerebral infarction due to thrombosis of unspecified vertebral artery: Secondary | ICD-10-CM | POA: Diagnosis not present

## 2016-12-22 DIAGNOSIS — Z79899 Other long term (current) drug therapy: Secondary | ICD-10-CM

## 2016-12-22 DIAGNOSIS — I2510 Atherosclerotic heart disease of native coronary artery without angina pectoris: Secondary | ICD-10-CM | POA: Diagnosis not present

## 2016-12-22 DIAGNOSIS — I10 Essential (primary) hypertension: Secondary | ICD-10-CM | POA: Diagnosis not present

## 2016-12-22 DIAGNOSIS — H5712 Ocular pain, left eye: Secondary | ICD-10-CM | POA: Diagnosis not present

## 2016-12-22 DIAGNOSIS — M25562 Pain in left knee: Principal | ICD-10-CM

## 2016-12-22 DIAGNOSIS — M5432 Sciatica, left side: Secondary | ICD-10-CM | POA: Diagnosis present

## 2016-12-22 DIAGNOSIS — E1151 Type 2 diabetes mellitus with diabetic peripheral angiopathy without gangrene: Secondary | ICD-10-CM | POA: Diagnosis present

## 2016-12-22 DIAGNOSIS — E118 Type 2 diabetes mellitus with unspecified complications: Secondary | ICD-10-CM

## 2016-12-22 DIAGNOSIS — N401 Enlarged prostate with lower urinary tract symptoms: Secondary | ICD-10-CM | POA: Diagnosis present

## 2016-12-22 DIAGNOSIS — R29818 Other symptoms and signs involving the nervous system: Secondary | ICD-10-CM | POA: Diagnosis not present

## 2016-12-22 DIAGNOSIS — I5022 Chronic systolic (congestive) heart failure: Secondary | ICD-10-CM | POA: Diagnosis present

## 2016-12-22 DIAGNOSIS — Z823 Family history of stroke: Secondary | ICD-10-CM

## 2016-12-22 DIAGNOSIS — E1169 Type 2 diabetes mellitus with other specified complication: Secondary | ICD-10-CM | POA: Diagnosis not present

## 2016-12-22 DIAGNOSIS — M6289 Other specified disorders of muscle: Secondary | ICD-10-CM | POA: Diagnosis not present

## 2016-12-22 DIAGNOSIS — I360 Nonrheumatic tricuspid (valve) stenosis: Secondary | ICD-10-CM | POA: Diagnosis not present

## 2016-12-22 LAB — I-STAT CHEM 8, ED
BUN: 8 mg/dL (ref 6–20)
CHLORIDE: 103 mmol/L (ref 101–111)
CREATININE: 1.1 mg/dL (ref 0.61–1.24)
Calcium, Ion: 1.12 mmol/L — ABNORMAL LOW (ref 1.15–1.40)
GLUCOSE: 76 mg/dL (ref 65–99)
HEMATOCRIT: 47 % (ref 39.0–52.0)
HEMOGLOBIN: 16 g/dL (ref 13.0–17.0)
POTASSIUM: 3.8 mmol/L (ref 3.5–5.1)
Sodium: 140 mmol/L (ref 135–145)
TCO2: 30 mmol/L (ref 0–100)

## 2016-12-22 LAB — RAPID URINE DRUG SCREEN, HOSP PERFORMED
Amphetamines: NOT DETECTED
Barbiturates: NOT DETECTED
Benzodiazepines: NOT DETECTED
Cocaine: NOT DETECTED
OPIATES: NOT DETECTED
TETRAHYDROCANNABINOL: NOT DETECTED

## 2016-12-22 LAB — DIFFERENTIAL
BASOS ABS: 0 10*3/uL (ref 0.0–0.1)
Basophils Relative: 1 %
Eosinophils Absolute: 0.2 10*3/uL (ref 0.0–0.7)
Eosinophils Relative: 3 %
Lymphocytes Relative: 30 %
Lymphs Abs: 2.4 10*3/uL (ref 0.7–4.0)
MONO ABS: 0.4 10*3/uL (ref 0.1–1.0)
MONOS PCT: 5 %
NEUTROS ABS: 5.1 10*3/uL (ref 1.7–7.7)
Neutrophils Relative %: 61 %

## 2016-12-22 LAB — I-STAT TROPONIN, ED: Troponin i, poc: 0.01 ng/mL (ref 0.00–0.08)

## 2016-12-22 LAB — COMPREHENSIVE METABOLIC PANEL
ALK PHOS: 80 U/L (ref 38–126)
ALT: 22 U/L (ref 17–63)
AST: 25 U/L (ref 15–41)
Albumin: 3.6 g/dL (ref 3.5–5.0)
Anion gap: 8 (ref 5–15)
BUN: 7 mg/dL (ref 6–20)
CHLORIDE: 104 mmol/L (ref 101–111)
CO2: 26 mmol/L (ref 22–32)
CREATININE: 1.16 mg/dL (ref 0.61–1.24)
Calcium: 9.5 mg/dL (ref 8.9–10.3)
Glucose, Bld: 81 mg/dL (ref 65–99)
Potassium: 3.7 mmol/L (ref 3.5–5.1)
SODIUM: 138 mmol/L (ref 135–145)
Total Bilirubin: 0.4 mg/dL (ref 0.3–1.2)
Total Protein: 7.1 g/dL (ref 6.5–8.1)

## 2016-12-22 LAB — GLUCOSE, CAPILLARY
GLUCOSE-CAPILLARY: 71 mg/dL (ref 65–99)
Glucose-Capillary: 78 mg/dL (ref 65–99)

## 2016-12-22 LAB — CBC
HEMATOCRIT: 46 % (ref 39.0–52.0)
Hemoglobin: 14.7 g/dL (ref 13.0–17.0)
MCH: 29.2 pg (ref 26.0–34.0)
MCHC: 32 g/dL (ref 30.0–36.0)
MCV: 91.5 fL (ref 78.0–100.0)
Platelets: 288 10*3/uL (ref 150–400)
RBC: 5.03 MIL/uL (ref 4.22–5.81)
RDW: 14.6 % (ref 11.5–15.5)
WBC: 8.1 10*3/uL (ref 4.0–10.5)

## 2016-12-22 LAB — PROTIME-INR
INR: 0.97
Prothrombin Time: 12.9 seconds (ref 11.4–15.2)

## 2016-12-22 LAB — APTT: APTT: 29 s (ref 24–36)

## 2016-12-22 LAB — CBG MONITORING, ED: Glucose-Capillary: 70 mg/dL (ref 65–99)

## 2016-12-22 MED ORDER — DULOXETINE HCL 60 MG PO CPEP
60.0000 mg | ORAL_CAPSULE | Freq: Every day | ORAL | Status: DC
  Administered 2016-12-22 – 2016-12-25 (×3): 60 mg via ORAL
  Filled 2016-12-22 (×3): qty 1

## 2016-12-22 MED ORDER — TAMSULOSIN HCL 0.4 MG PO CAPS
0.4000 mg | ORAL_CAPSULE | Freq: Every day | ORAL | Status: DC
  Administered 2016-12-23 – 2016-12-25 (×2): 0.4 mg via ORAL
  Filled 2016-12-22 (×2): qty 1

## 2016-12-22 MED ORDER — FLUTICASONE FUROATE-VILANTEROL 100-25 MCG/INH IN AEPB
1.0000 | INHALATION_SPRAY | Freq: Every day | RESPIRATORY_TRACT | Status: DC
  Administered 2016-12-23 – 2016-12-24 (×2): 1 via RESPIRATORY_TRACT
  Filled 2016-12-22 (×2): qty 28

## 2016-12-22 MED ORDER — GABAPENTIN 300 MG PO CAPS
300.0000 mg | ORAL_CAPSULE | Freq: Three times a day (TID) | ORAL | Status: DC
  Administered 2016-12-22 – 2016-12-25 (×6): 300 mg via ORAL
  Filled 2016-12-22 (×8): qty 1

## 2016-12-22 MED ORDER — INSULIN ASPART 100 UNIT/ML ~~LOC~~ SOLN: 0.0000 [IU] | Freq: Every day | SUBCUTANEOUS | Status: DC

## 2016-12-22 MED ORDER — LORAZEPAM 1 MG PO TABS
1.0000 mg | ORAL_TABLET | Freq: Once | ORAL | Status: AC | PRN
  Administered 2016-12-22: 1 mg via ORAL
  Filled 2016-12-22: qty 1

## 2016-12-22 MED ORDER — ACETAMINOPHEN 160 MG/5ML PO SOLN: 650.0000 mg | ORAL | Status: DC | PRN

## 2016-12-22 MED ORDER — ALLOPURINOL 100 MG PO TABS
300.0000 mg | ORAL_TABLET | Freq: Every day | ORAL | Status: DC
  Administered 2016-12-22 – 2016-12-25 (×3): 300 mg via ORAL
  Filled 2016-12-22 (×3): qty 3

## 2016-12-22 MED ORDER — ACETAMINOPHEN 650 MG RE SUPP: 650.0000 mg | RECTAL | Status: DC | PRN

## 2016-12-22 MED ORDER — STROKE: EARLY STAGES OF RECOVERY BOOK: Freq: Once | Status: DC

## 2016-12-22 MED ORDER — IOPAMIDOL (ISOVUE-370) INJECTION 76%
INTRAVENOUS | Status: AC
  Administered 2016-12-22: 140 mL via INTRAVENOUS
  Filled 2016-12-22: qty 50

## 2016-12-22 MED ORDER — INSULIN ASPART 100 UNIT/ML ~~LOC~~ SOLN
0.0000 [IU] | Freq: Three times a day (TID) | SUBCUTANEOUS | Status: DC
Start: 2016-12-22 — End: 2016-12-25
  Administered 2016-12-25: 2 [IU] via SUBCUTANEOUS

## 2016-12-22 MED ORDER — PANTOPRAZOLE SODIUM 40 MG PO TBEC
40.0000 mg | DELAYED_RELEASE_TABLET | Freq: Every day | ORAL | Status: DC
  Administered 2016-12-22 – 2016-12-25 (×3): 40 mg via ORAL
  Filled 2016-12-22 (×3): qty 1

## 2016-12-22 MED ORDER — ASPIRIN 325 MG PO TABS
325.0000 mg | ORAL_TABLET | Freq: Every day | ORAL | Status: DC
  Administered 2016-12-22: 325 mg via ORAL
  Filled 2016-12-22: qty 1

## 2016-12-22 MED ORDER — UMECLIDINIUM BROMIDE 62.5 MCG/INH IN AEPB
1.0000 | INHALATION_SPRAY | Freq: Every day | RESPIRATORY_TRACT | Status: DC
  Administered 2016-12-23 – 2016-12-24 (×2): 1 via RESPIRATORY_TRACT
  Filled 2016-12-22 (×2): qty 7

## 2016-12-22 MED ORDER — ENOXAPARIN SODIUM 40 MG/0.4ML ~~LOC~~ SOLN
40.0000 mg | SUBCUTANEOUS | Status: DC
  Administered 2016-12-22 – 2016-12-24 (×3): 40 mg via SUBCUTANEOUS
  Filled 2016-12-22 (×3): qty 0.4

## 2016-12-22 MED ORDER — PRIMIDONE 50 MG PO TABS
50.0000 mg | ORAL_TABLET | Freq: Every day | ORAL | Status: DC
  Administered 2016-12-22 – 2016-12-25 (×3): 50 mg via ORAL
  Filled 2016-12-22 (×3): qty 1

## 2016-12-22 MED ORDER — SENNOSIDES-DOCUSATE SODIUM 8.6-50 MG PO TABS: 1.0000 | ORAL_TABLET | Freq: Every evening | ORAL | Status: DC | PRN

## 2016-12-22 MED ORDER — ATORVASTATIN CALCIUM 80 MG PO TABS
80.0000 mg | ORAL_TABLET | Freq: Every day | ORAL | Status: DC
  Administered 2016-12-22 – 2016-12-24 (×3): 80 mg via ORAL
  Filled 2016-12-22 (×3): qty 1

## 2016-12-22 MED ORDER — ACETAMINOPHEN 325 MG PO TABS
650.0000 mg | ORAL_TABLET | ORAL | Status: DC | PRN
  Administered 2016-12-22 – 2016-12-25 (×2): 650 mg via ORAL
  Filled 2016-12-22 (×2): qty 2

## 2016-12-22 MED ORDER — ALBUTEROL SULFATE (2.5 MG/3ML) 0.083% IN NEBU: 2.5000 mg | INHALATION_SOLUTION | Freq: Four times a day (QID) | RESPIRATORY_TRACT | Status: DC | PRN

## 2016-12-22 NOTE — Consult Note (Signed)
Referring Physician: Dr. Leonette Monarch    Chief Complaint: Acute onset of LUE incoordination and weakness  HPI: James Zuniga is an 61 y.o. male who presents for acute onset LUE incoordination and weakness. LKN was early this AM at [place]. He woke up at about 0130 and noticed that he was having difficulty using his left hand to adjust his CPAP mask. He then went back to bed. When his wife arrived home later this morning (she is a CNA), she noted that he was not quite right and took him to his PCP, who also noted a deficit and had him transported emergently to the ED. Code Stroke was called by EMS en route when patient began having difficulty seeing and with his speech.   LSN: [id number] tPA Given: No: Out of IV tPA time window  Past Medical History:  Diagnosis Date  . CHF (congestive heart failure) ([place])   . Chronic airway obstruction ([place])   . Chronic pain   . Coronary artery disease   . Diabetes mellitus   . Disorder of liver   . Esophageal reflux   . Gout   . Hyperlipidemia   . Hypertension   . Hypokalemia   . Hypokalemia 11/2016  . Hypotestosteronism 07/01/2013  . Insomnia   . Iron deficiency anemia, unspecified 07/01/2013  . Memory loss   . PVD (peripheral vascular disease) ([place])   . Renal disorder   . Vitamin D deficiency     Past Surgical History:  Procedure Laterality Date  . KNEE ARTHROSCOPY    . NO PAST SURGERIES      Family History  Problem Relation Age of Onset  . Heart disease Mother   . Diabetes Father   . Stroke Brother    Social History:  reports that he quit smoking about 2 months ago. His smoking use included Cigarettes. He started smoking about 33 years ago. He has a 30.00 pack-year smoking history. He has never used smokeless tobacco. He reports that he does not drink alcohol or use drugs.  Allergies: No Known Allergies  Medications:   albuterol (PROVENTIL HFA;VENTOLIN HFA) 108 (90 Base) MCG/ACT inhaler Inhale 1-2 puffs into the lungs every 6 (six) hours as  needed for wheezing. Jule Ser, DO Not Ordered  albuterol (PROVENTIL) (2.5 MG/3ML) 0.083% nebulizer solution Take 2.5 mg by nebulization every 6 (six) hours as needed for wheezing or shortness of breath. Jule Ser, DO Reordered  Orderedas:albuterol (PROVENTIL) (2.5 MG/3ML) 0.083% nebulizer solution 2.5 mg - 2.5 mg, Nebulization, Every 6 hours PRN, wheezing, shortness of breath, Starting Tue 12/22/16 at 1534  allopurinol (ZYLOPRIM) 300 MG tablet Take 300 mg by mouth daily. Jule Ser, DO Reordered  Orderedas:allopurinol [facility]) tablet 300 mg - 300 mg, Oral, Daily, First dose on Tue 12/22/16 at 1800  aspirin 81 MG EC tablet Take 81 mg by mouth daily. Swallow whole. Jule Ser, DO Not Ordered  atorvastatin (LIPITOR) 20 MG tablet Take 20 mg by mouth every morning.  Jule Ser, DO Reordered  Orderedas:atorvastatin (LIPITOR) tablet 80 mg - 80 mg, Oral, Daily-1800, First dose on Tue 12/22/16 at 1800  BREO ELLIPTA 100-25 MCG/INH AEPB INL 1 PUFF PO D Jule Ser, DO Reordered   Patient taking differently: Inhale 1 puff into the lungs daily.     Orderedas:fluticasone furoate-vilanterol (BREO ELLIPTA) 100-25 MCG/INH 1 puff - 1 puff, Inhalation, Daily, First dose on Wed 12/23/16 at 1000  Dulaglutide 0.75 MG/0.5ML SOPN Inject 0.75 mg into the skin every 7 (seven) days. Tuesday of each  week Jule Ser, DO Not Ordered  DULoxetine (CYMBALTA) 60 MG capsule Take 60 mg by mouth daily. Jule Ser, DO Reordered  Orderedas:DULoxetine (CYMBALTA) DR capsule 60 mg - 60 mg, Oral, Daily, First dose on Tue 12/22/16 at 1800  furosemide (LASIX) 20 MG tablet Take 20 mg by mouth 2 (two) times daily.  Jule Ser, DO Not Ordered  gabapentin (NEURONTIN) 300 MG capsule Take 300 mg by mouth 3 (three) times daily.  Jule Ser, DO Reordered  Orderedas:gabapentin (NEURONTIN) capsule 300 mg - 300 mg, Oral, 3 times daily, First dose on Tue 12/22/16 at 2200  losartan (COZAAR) 100 MG  tablet Take 100 mg by mouth daily.  Jule Ser, DO Not Ordered  omeprazole (PRILOSEC) 20 MG capsule Take 20 mg by mouth daily.  Jule Ser, DO Reordered  Orderedas:pantoprazole (PROTONIX) EC tablet 40 mg - 40 mg, Oral, Daily, First dose on Tue 12/22/16 at 1800  potassium chloride SA (K-DUR,KLOR-CON) 20 MEQ tablet Take 1 tablet (20 mEq total) by mouth 2 (two) times daily. Jule Ser, DO Not Ordered  primidone (MYSOLINE) 50 MG tablet Take 50 mg by mouth daily.  Jule Ser, DO Reordered  Orderedas:primidone (MYSOLINE) tablet 50 mg - 50 mg, Oral, Daily, First dose on Tue 12/22/16 at 1800  tamsulosin (FLOMAX) 0.4 MG CAPS capsule Take 1 capsule (0.4 mg total) by mouth daily after breakfast. Jule Ser, DO Reordered  Orderedas:tamsulosin [facility]) capsule 0.4 mg - 0.4 mg, Oral, Daily after breakfast, First dose on Wed 12/23/16 at 0900  testosterone cypionate (DEPOTESTOSTERONE CYPIONATE) 200 MG/ML injection Inject 200 mg into the muscle every 14 (fourteen) days.  Jule Ser, DO Not Ordered  tiZANidine (ZANAFLEX) 4 MG tablet 4 mg 3 (three) times daily as needed for muscle spasms.  Jule Ser, DO Not Ordered  traMADol (ULTRAM) 50 MG tablet Take 50 mg by mouth daily as needed for moderate pain.  Jule Ser, DO Not Ordered  umeclidinium bromide (INCRUSE ELLIPTA) 62.5 MCG/INH AEPB Inhale 1 puff into the lungs daily. Jule Ser, DO Reordered  Orderedas:umeclidinium bromide (INCRUSE ELLIPTA) 62.5 MCG/INH 1 puff - 1 puff, Inhalation, Daily, First dose on Wed 12/23/16 at 1000  zolpidem (AMBIEN) 10 MG tablet Take 10 mg by mouth at bedtime.  Jule Ser, DO Not Ordered  Fluticasone-Umeclidin-Vilant 100-62.5-25 MCG/INH AEPB Inhale 1 puff into the lungs daily. Jule Ser, DO Not Ordered  OXYGEN Inhale 2 L into the lungs as needed. Jule Ser, DO Not Ordered  tiotropium (SPIRIVA) 18 MCG inhalation capsule Place 1 capsule (18 mcg total) into inhaler and inhale  daily. Jule Ser, DO Not Ordered   Patient not taking: Reported on 12/22/2016    diazepam (VALIUM) 5 MG tablet Take as directed prior to MRI Jule Ser, DO Discontinued  linagliptin (TRADJENTA) 5 MG TABS tablet Take 5 mg by mouth. Jule Ser, DO Discontinued    ROS: As per HPI.  Physical Examination: There were no vitals taken for this visit.  HEENT: [place]/AT Lungs: Respirations unlabored Ext: Warm and well perfused  Neurologic Examination: Mental Status: Awake and alert. Oriented to self, location, city, state, year, month and day. Speech fluent with intact comprehension, naming and repetition. Dysarthria noted. Able to follow all commands without difficulty. Cranial Nerves: II:  Visual fields intact all 4 quadrants with each eye tested individually. PERRL.  III,IV, VI: ptosis not present, EOMI without nystagmus V,VII: smile symmetric, facial temp sensation normal bilaterally VIII: hearing intact to conversation IX,X: palate rises symmetrically XI: No gross asymmetry XII: midline tongue extension  Motor: RUE and RLE 5/5 LUE: 4/5 LLE: 5/5 Mildly decreased tone to LUE and LLE.  Sensory: Temperature and light touch intact x 4 Deep Tendon Reflexes:  Slightly hypoactive on left relative to the right Cerebellar: Ataxia with left FNF and left heel-shin. Normal on right Gait: Deferred due to falls risk concerns   Results for orders placed or performed during the hospital encounter of 12/22/16 (from the past 48 hour(s))  CBG monitoring, ED     Status: None   Collection Time: 12/22/16 11:25 AM  Result Value Ref Range   Glucose-Capillary 70 65 - 99 mg/dL  Protime-INR     Status: None   Collection Time: 12/22/16 11:28 AM  Result Value Ref Range   Prothrombin Time 12.9 11.4 - 15.2 seconds   INR 0.97   APTT     Status: None   Collection Time: 12/22/16 11:28 AM  Result Value Ref Range   aPTT 29 24 - 36 seconds  CBC     Status: None   Collection Time: 12/22/16 11:28  AM  Result Value Ref Range   WBC 8.1 4.0 - 10.5 K/uL   RBC 5.03 4.22 - 5.81 MIL/uL   Hemoglobin 14.7 13.0 - 17.0 g/dL   HCT 46.0 39.0 - 52.0 %   MCV 91.5 78.0 - 100.0 fL   MCH 29.2 26.0 - 34.0 pg   MCHC 32.0 30.0 - 36.0 g/dL   RDW 14.6 11.5 - 15.5 %   Platelets 288 150 - 400 K/uL  Differential     Status: None   Collection Time: 12/22/16 11:28 AM  Result Value Ref Range   Neutrophils Relative % 61 %   Neutro Abs 5.1 1.7 - 7.7 K/uL   Lymphocytes Relative 30 %   Lymphs Abs 2.4 0.7 - 4.0 K/uL   Monocytes Relative 5 %   Monocytes Absolute 0.4 0.1 - 1.0 K/uL   Eosinophils Relative 3 %   Eosinophils Absolute 0.2 0.0 - 0.7 K/uL   Basophils Relative 1 %   Basophils Absolute 0.0 0.0 - 0.1 K/uL  I-stat troponin, ED     Status: None   Collection Time: 12/22/16 11:31 AM  Result Value Ref Range   Troponin i, poc 0.01 0.00 - 0.08 ng/mL   Comment 3            Comment: Due to the release kinetics of cTnI, a negative result within the first hours of the onset of symptoms does not rule out myocardial infarction with certainty. If myocardial infarction is still suspected, repeat the test at appropriate intervals.   I-Stat Chem 8, ED     Status: Abnormal   Collection Time: 12/22/16 11:33 AM  Result Value Ref Range   Sodium 140 135 - 145 mmol/L   Potassium 3.8 3.5 - 5.1 mmol/L   Chloride 103 101 - 111 mmol/L   BUN 8 6 - 20 mg/dL   Creatinine, Ser 1.10 0.61 - 1.24 mg/dL   Glucose, Bld 76 65 - 99 mg/dL   Calcium, Ion 1.12 (L) 1.15 - 1.40 mmol/L   TCO2 30 0 - 100 mmol/L   Hemoglobin 16.0 13.0 - 17.0 g/dL   HCT 47.0 39.0 - 52.0 %   Ct Head Code Stroke W/o Cm  Result Date: 12/22/2016 CLINICAL DATA:  Code stroke.  Code stroke, left-sided weakness EXAM: CT HEAD WITHOUT CONTRAST TECHNIQUE: Contiguous axial images were obtained from the base of the skull through the vertex without intravenous contrast. COMPARISON:  CT head 09/16/2016  FINDINGS: Brain: Negative for acute infarct, hemorrhage,  mass. Ventricle size normal. Patchy hypodensity in the cerebral white matter bilaterally appears chronic. Vascular: Negative for hyperdense vessel. Skull: Negative Sinuses/Orbits: Mild mucosal edema in the frontal sinuses. Remaining sinuses clear. Normal orbit. Other: None ASPECTS ([place] Stroke Program Early CT Score) - Ganglionic level infarction (caudate, lentiform nuclei, internal capsule, insula, M1-M3 cortex): 7 - Supraganglionic infarction (M4-M6 cortex): 3 Total score (0-10 with 10 being normal): 10 IMPRESSION: 1. No acute intracranial abnormality 2. ASPECTS is 10 Electronically Signed   By: Franchot Gallo M.D.   On: 12/22/2016 11:45    Assessment: 62 y.o. male with acute onset of left upper extremity weakness with incoordination.  1. NIHSS = 4 2. Exam best localizes as a left cerebellar lesion.  3. CT head without acute abormality. Most likely a subacute stroke is present that is not yet visible on CT. Out of the IV tPA time window. Classifiable as having failed ASA monotherapy.  4. CTA of head and neck shows no LVO, therefore not an endovascular candidate.  5. CT perfusion shows no definite perfusion deficit. No core infarct detected. Artifact noted.  6. Stroke Risk Factors - CHF, CAD, DM, HLD, HTN, PVD  Plan: 1. HgbA1c, fasting lipid panel 2. MRI, MRA  of the brain without contrast 3. PT consult, OT consult, Speech consult 4. Echocardiogram 5. Carotid dopplers 6. Add Plavix to ASA 7. Risk factor modification 8. Telemetry monitoring 9. Frequent neuro checks 10. Continue atorvastatin.  11. Permissive HTN x 24 hours  @Electronically  signed: Dr. Kerney Elbe  12/22/2016, 11:51 AM

## 2016-12-22 NOTE — ED Notes (Signed)
#  18 IV in RAC placed by EMS removed by this RN d/t iv contrast infiltration. Warm compress applied. Pt denies pain to site. RUE elevated on towel rolls.

## 2016-12-22 NOTE — ED Notes (Signed)
Ice applied to RAC with compression wrap.  Pt denies pain to site.  No change since prior assessment.

## 2016-12-22 NOTE — ED Triage Notes (Signed)
Per EMS:  Pt LKW 0634 this am.  Pt woke up at 0130 this am " not feeling well and unable to control left arm".  Pts wife accompanied pt to PCP with c/o of left arm dysfunction and pain. PCP called EMS to have pt transported to [facility] for Code Stroke.  In route to ED pt c/o of headache and visual disturbances.

## 2016-12-22 NOTE — Code Documentation (Signed)
61yo male arriving to [facility] via [place] at 1122.  Patient from PCP office where EMS was called for left sided deficits.  EMS reports patient noticed difficulty putting on his CPAP machine at 0005 this morning.  His wife who is a CNA returned home later in the morning and noticed "something was not right" and took him to his PCP.  EMS assessed left facial droop, left hand weakness and visual disturbance en route to the hospital and activated a code stroke.  Stroke team to the bedside on patient arrival.  Labs drawn and patient cleared for CT by Dr. Leonette Monarch.  Patient to CT with team.  CT completed.  NIHSS 4, see documentation for details and code stroke times.  Patient with left arm and leg ataxia, mild dysarthria and decreased sensation on the left side on exam.  CTP and CTA completed per MD order.  Patient with contrast extravasation requiring CTA to be repeated.  Patient is outside the window for treatment with tPA.  No acute stroke treatment at this time per MD.  Bedside handoff with ED RN.

## 2016-12-22 NOTE — H&P (Signed)
Date: 12/22/2016               Patient Name:  James Zuniga MRN: [id number]  DOB: Jan 27, 1956 Age / Sex: 61 y.o., male   PCP: Berkley Harvey, NP              Medical Service: Internal Medicine Teaching Service              Attending Physician: Dr. Joni Reining    First Contact: Janett Labella, MS3 Pager: [phone]  Second Contact: Dr. Jari Favre Pager: [phone]  Third Contact Dr. Juleen China Pager: [phone]       After Hours (After 5p/  First Contact Pager: [phone]  weekends / holidays): Second Contact Pager: [phone]   Chief Complaint: Left hand and left leg weakness  History of Present Illness: James Zuniga is a 61 y/o right-handed man with PMH of CHF (EF 35-40%), COPD (CPAP for sleep, 2L O2 PRN), hypertension, iron deficiency anemia, and left sciatica (occupational back injury 30 yrs. Ago) who presents with left hand and left leg weakness. The patient says he went to bed at approximately 12:00 am, at which time he felt normal, with no weakness. He denies doing anything unusual that day, and says he took all of his normal bedtime medications (ambien, tramadol, tinazidine). He awoke at around 1:30 am, at which time he attempted to put on his CPAP (he typically wears this every night). He was unable to put on this CPAP due to apparent weakness. He says at that time he recognized that "something was not right."  He went to pour a drink of water in his kitchen, during which he dropped his cup from his left. At this time he was aware of weakness in his left hand and left leg. The patient went back to bed and waited for his wife to come home. When his wife came home, she took him to his PCP they called an ambulance which brought the patient to  [facility] ED.   In the ED, the patient describes that the weakness in his left arm and left leg is unchanged from when it first started. He describes accompanying tingling and numbness in the same extremities. He also describes a headache localized to the right fronto-lateral head  that has been off and on for the past week that gets better with sleep, though he denies typically having headaches. He further states that he may have some left sided vision changes, but was unable to elaborate further. He says his blood sugars have been running high in the 300s over the past few weeks but denies hypoglycemic events. He also states that his blood pressure has been consistently elevated in the 150s/100s. He denies recent travel and sick contacts. He denies any head trauma, loss of consciousness.  The patient presented afebrile with BP 159/102, HR 72, RR 21, saturating 92% on RA. CT w/o contrast showed no acute intracranial abnormalities, negative CTA, ECG shows mild ST-elevations in leads V2, V3, V3 new from previous ECG (11/15/16) (pt. denies chest pain), troponin NL, glucose 70, CBC pan-normal, C-met pan-normal. Last known normal >4.5 hours prior to admission.   Meds: No current facility-administered medications for this encounter.    Current Outpatient Prescriptions  Medication Sig Dispense Refill  . albuterol (PROVENTIL HFA;VENTOLIN HFA) 108 (90 Base) MCG/ACT inhaler Inhale 1-2 puffs into the lungs every 6 (six) hours as needed for wheezing. 1 Inhaler 0  . albuterol (PROVENTIL) (2.5 MG/3ML) 0.083% nebulizer solution Take 2.5 mg by nebulization every  6 (six) hours as needed for wheezing or shortness of breath.    . allopurinol (ZYLOPRIM) 300 MG tablet Take 300 mg by mouth daily.    Marland Kitchen aspirin 81 MG EC tablet Take 81 mg by mouth daily. Swallow whole.    Marland Kitchen atorvastatin (LIPITOR) 20 MG tablet Take 20 mg by mouth every morning.     Marland Kitchen BREO ELLIPTA 100-25 MCG/INH AEPB INL 1 PUFF PO D (Patient taking differently: Inhale 1 puff into the lungs daily. ) 1 each 0  . diazepam (VALIUM) 5 MG tablet Take as directed prior to MRI 4 tablet 0  . Dulaglutide 0.75 MG/0.5ML SOPN Inject 0.75 mg into the skin.    . DULoxetine (CYMBALTA) 60 MG capsule Take 60 mg by mouth daily.    .  Fluticasone-Umeclidin-Vilant 100-62.5-25 MCG/INH AEPB Inhale 1 puff into the lungs daily.    . furosemide (LASIX) 20 MG tablet Take 20 mg by mouth 2 (two) times daily.     Marland Kitchen gabapentin (NEURONTIN) 300 MG capsule Take 300 mg by mouth 3 (three) times daily.     Marland Kitchen linagliptin (TRADJENTA) 5 MG TABS tablet Take 5 mg by mouth.    . losartan (COZAAR) 100 MG tablet Take 100 mg by mouth daily.     Marland Kitchen omeprazole (PRILOSEC) 20 MG capsule Take 20 mg by mouth daily.   0  . OXYGEN Inhale 2 L into the lungs as needed.    . potassium chloride SA (K-DUR,KLOR-CON) 20 MEQ tablet Take 1 tablet (20 mEq total) by mouth 2 (two) times daily. 30 tablet 3  . primidone (MYSOLINE) 50 MG tablet Take 50 mg by mouth daily.     . tamsulosin (FLOMAX) 0.4 MG CAPS capsule Take 1 capsule (0.4 mg total) by mouth daily after breakfast. 30 capsule 12  . testosterone cypionate (DEPOTESTOSTERONE CYPIONATE) 200 MG/ML injection Inject 200 mg into the muscle every 14 (fourteen) days.     Marland Kitchen tiotropium (SPIRIVA) 18 MCG inhalation capsule Place 1 capsule (18 mcg total) into inhaler and inhale daily. 30 capsule 6  . tiZANidine (ZANAFLEX) 4 MG tablet 4 mg 3 (three) times daily as needed for muscle spasms.   0  . traMADol (ULTRAM) 50 MG tablet Take 50 mg by mouth daily as needed for moderate pain.     Marland Kitchen umeclidinium bromide (INCRUSE ELLIPTA) 62.5 MCG/INH AEPB Inhale 1 puff into the lungs daily. 1 each 0  . zolpidem (AMBIEN) 10 MG tablet Take 10 mg by mouth at bedtime.       Allergies: Allergies as of 12/22/2016  . (No Known Allergies)   Past Medical History:  Diagnosis Date  . CHF (congestive heart failure) ([place])   . Chronic airway obstruction ([place])   . Chronic pain   . Coronary artery disease   . Diabetes mellitus   . Disorder of liver   . Esophageal reflux   . Gout   . Hyperlipidemia   . Hypertension   . Hypokalemia   . Hypokalemia 11/2016  . Hypotestosteronism 07/01/2013  . Insomnia   . Iron deficiency anemia, unspecified  07/01/2013  . Memory loss   . PVD (peripheral vascular disease) ([place])   . Renal disorder   . Vitamin D deficiency    Past Surgical History:  Procedure Laterality Date  . KNEE ARTHROSCOPY    . NO PAST SURGERIES     Family history: Mother: heart disease Father: Diabetes mellitus  Brother 1: hemorrhagic stroke Brother 2: Heart failure Multiple uncles: stroke  Social  history: 40 pk/yrs smoking history, denies alcohol, consumption, denies illicit drug use. No occupation due to back injury 30 years ago.   Review of Systems: Constitutional: denies fevers, chills, night sweats, weight loss HEENT: Denies hearing changes, difficulty breathing, change in tone; endorses mild vision change to left eye CV: Denies chest pain, palpitations, dizziness, syncope Lungs: Denies shortness of breath, wheezing Abdomen: denies abdominal pain GI: denies change in bowel habit, hematochezia, melena GU: denies change in urinary frequency, gross hematuria Neuro: endorses paraesthesias of left hand and left foot, weakness of left upper and lower extremities  Physical Exam: Blood pressure (!) 145/110, pulse (!) 57, temperature 97.9 F (36.6 C), resp. rate 17, height '5\' 6"'  (1.676 m), weight 98 kg (216 lb 0.8 oz), SpO2 93 %.  Gen: Well-appearing, no acute distress.  CV: RRR, no MRG Pulm: CTAB Neuro: Mild sensation deficit in left V2 distribution. Otherwise CN II-XII intact bilaterally. Extraocular movements intact, symmetric face, tongue midline. 5/5 strength right upper and lower extremities. 4/5 strength left upper and lower extremities. Left upper extremity notable for spasticity with movement. Normal sensation right upper and lower extremities. Decreased sensation in left upper and lower extremities. Finger-to-nose test significant for left-sided dysmetria. Both rapid alternating movements of hands and heel-to-shin test normal bilaterally. Left pronator drift. Extremities: 1+ pitting edema lower extremities  bilaterally  Lab results: Basic Metabolic Panel:  Recent Labs  12/22/16 1128 12/22/16 1133  NA 138 140  K 3.7 3.8  CL 104 103  CO2 26  --   GLUCOSE 81 76  BUN 7 8  CREATININE 1.16 1.10  CALCIUM 9.5  --    Liver Function Tests:  Recent Labs  12/22/16 1128  AST 25  ALT 22  ALKPHOS 80  BILITOT 0.4  PROT 7.1  ALBUMIN 3.6   CBC:  Recent Labs  12/22/16 1128 12/22/16 1133  WBC 8.1  --   NEUTROABS 5.1  --   HGB 14.7 16.0  HCT 46.0 47.0  MCV 91.5  --   PLT 288  --    Cardiac Enzymes:  CBG:  Recent Labs  12/22/16 1125  GLUCAP 70   Coagulation:  Recent Labs  12/22/16 1128  LABPROT 12.9  INR 0.97    Imaging results:   -CT w/o contrast showed no acute intracranial abnormalities -Negative CTA  Other results: EKG: Concave ST-segment elevations in leads V2, V3, V3 and poor R-wave progression new from previous ECG (11/15/16)  Troponin: 0.01 (0.00-0.08)  Assessment & Plan by Problem: Active Problems:   * No active hospital problems. *   James Zuniga is a 61 y/o right-handed man with PMH of CHF (EF 35-40%), COPD (CPAP for sleep, 2L O2 PRN), hypertension, iron deficiency anemia, and left sciatica (occupational back injury 30 yrs. Ago) who presents with left hand and left leg weakness most concerning for stroke.  Left-sided weakness: In setting of 61 y/o pt. With HTN and diabetes mellitus 2, and family history significant for multiple family members with cerebrovascular events, right sided stroke most concerning. In particular, involvement of left arm weakness suggests R. MCA involvement, and left leg weakness suggests likely either R. MCA or R. ACA involvement. Cerebellar involvement is also possible given dysmetria on exam. Negative CT non-contrast suggests non-hemorrhagic, ischemic stroke. Embolic ischemic stroke less likely given lack of obvious embolus source: no evidence of DVT on exam in setting of previous echocardiogram neg. For septal defect (which would  allow for embolus to pass from right to left heart then  systemic circulation) as well as CT cerebral perfusion imaging showing no atherosclerotic disease or stenosis of carotids bilaterally. 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 Less likely on ddx include viral encephalitis (no confusion, no travel), meningitis (no fever, no meningismus), acute on chronic subdural bleed (neg. CT), intracranial mass (neg. CT, headache relieved with sleep/lying down), hypoglycemia (CBG 70 on admission but unilateral focal neuro deficits less consistent with hypoglycemia).  -Aspirin 325 mg QD -Lovenox DVT ppx -Atorvastatin 80 mg QD -Neuro consult -MRI of brain for further investigation of stroke etiology -Echocardiogram for possible embolic source -PT/OT and SLP consults   HTN: From recent clinic visits, BP run upper 130s/upper 80s. BP 140s/100s on admission, stable.  -Hold home losartan 100 mg, allow for permissive hypertension  CHF: EF 35-40% May 9242, grade 1 diastolic dysfunction. Pt. Denies dyspnea, SOB. Mild pitting edema on exam. Hx. Of hypokalemia. Stable.  -hold home furosemide 20 mg BID, allow for permissive hypertension -hold home Potassium chloride  COPD: 2017 PFTs show FVC (79%), FEV1 (70%), FEV1/FVC (110%). Pt. Uses CPAP at night, 2L O2 PRN during day. Currently no SOB, difficulty breathing, cough/sputum, and exam reveals clear lungs bilaterally.  -Home albuterol, Breo ellipta, fluticasone/umeclidin/vilant, tiotropium  Gout: stable.  -home allopurinol 300 mg qd  Sciatica: Hx. Of sciatica of left leg secondary to back injury 30 years ago. Stable.  -home tizanidine 4 mg q8h prn -home duloxetine 60 mg Qd  -gabapentin 300 mg TID  Diabetes mellitus 2: Pt. Reports blood glucose has recently been in the 200s. Last A1C 7.8 in May 2018. Glucose 70 at admission.   -SSI -Hold home medications   Bladder outlet obstruction: Denies dysuria or difficulty starting or stopping  stream.  -Home tamsulosin 0.4 mg QD  Dispo: floor GI/FEN: heart healthy/carb modified DVT ppx: lovenox   This is a Careers information officer Note.  The care of the patient was discussed with Dr. Jari Favre and the assessment and plan was formulated with their assistance.  Please see their note for official documentation of the patient encounter.   Signed: Jillyn Hidden, Medical Student 12/22/2016, 2:29 PM

## 2016-12-22 NOTE — ED Provider Notes (Signed)
[facility] DEPT Provider Note   CSN: [id number] Arrival date & time: 12/22/16  1121   An emergency department physician performed an initial assessment on this suspected stroke patient at 1122.  History   Chief Complaint Chief Complaint  Patient presents with  . Code Stroke    HPI James Zuniga is a 61 y.o. male.  HPI  61 year old male with a history of hypertension, hyperlipidemia, diabetes, CHF who presents to the ED with left-sided deficits. Last known normal was 12:05 AM (approximately 12 hours from now). Patient reports that he had difficulty putting on his CPAP machine last night. Note that he was having inability to control his left arm which gradually worsened throughout the night and into this morning. Patient also noted left leg weakness.  No other aggravating or alleviating factors.  No other physical complaints.   Patient went to his PCP who contacted EMS with concern for stroke. In route EMS noted the patient's mental status had changed and he had slurred speech. In coded as a code stroke given the new changes.  Past Medical History:  Diagnosis Date  . CHF (congestive heart failure) ([place])   . Chronic airway obstruction ([place])   . Chronic pain   . Coronary artery disease   . Diabetes mellitus   . Disorder of liver   . Esophageal reflux   . Gout   . Hyperlipidemia   . Hypertension   . Hypokalemia   . Hypokalemia 11/2016  . Hypotestosteronism 07/01/2013  . Insomnia   . Iron deficiency anemia, unspecified 07/01/2013  . Memory loss   . PVD (peripheral vascular disease) ([place])   . Renal disorder   . Vitamin D deficiency     Patient Active Problem List   Diagnosis Date Noted  . Bradycardia 11/12/2016  . Generalized weakness 11/12/2016  . AKI (acute kidney injury) ([place]) 11/12/2016  . Hypophosphatemia 11/12/2016  . Leukocytosis 11/12/2016  . Diabetes mellitus with complication ([place]) 40/98/1191  . At risk for falling 09/05/2015  . Chronic respiratory  failure ([place]) 07/22/2015  . COLD (chronic obstructive lung disease) ([place]) 11/12/2014  . Dyspnea and respiratory abnormality 11/12/2014  . Lung nodule 04/02/2014  . Smoking 04/02/2014  . Hypokalemia 12/15/2013  . Diabetes mellitus, type 2 ([place]) 09/04/2013  . Essential (primary) hypertension 09/04/2013  . Absolute anemia 09/01/2013  . Chronic pain 08/16/2013  . CAFL (chronic airflow limitation) ([place]) 08/16/2013  . Chronic obstructive pulmonary disease ([place]) 08/16/2013  . Adiposity 08/11/2013  . Peripheral blood vessel disorder ([place]) 07/03/2013  . Cannot sleep 07/03/2013  . Peripheral vascular disease ([place]) 07/03/2013  . Iron deficiency anemia 07/01/2013  . Hypotestosteronism 07/01/2013  . 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 Compulsive tobacco user syndrome 06/01/2013  . Current tobacco use 06/01/2013  . Pulmonary nodule, right 75mm Aug 2014 03/25/2013  . Liver lesion 03/25/2013  . Abnormal magnetic resonance imaging study 03/21/2013  . Disease of liver 03/15/2013  . 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 Cancer screening 10/18/2012  . COPD (chronic obstructive pulmonary disease) ([place]) 08/11/2012  . Adverse reaction to bacterial vaccine 06/17/2012  . 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 Body mass index 27.0-27.9, adult 01/04/2012  . Emphysema 11/23/2011  . Chronic gout 11/23/2011  . Congestive heart failure ([place]) 11/23/2011  . Hypertension 11/23/2011  . Hyperlipidemia 11/23/2011  . Chronic bronchitis 11/23/2011  . Type 2 diabetes mellitus ([place]) 11/23/2011  . Chronic back pain 11/23/2011  . Osteoarthritis of left knee 11/23/2011  . Avitaminosis D 11/05/2011  .  Gout 11/05/2011  . Cardiomyopathy, idiopathic ([place]) 11/04/2011  . Nondependent alcohol abuse 11/04/2011  . Amnesia 11/04/2011  . Acid reflux 11/04/2011  . Arthropathia 11/04/2011  . Primary cardiomyopathy ([place]) 11/04/2011    Past Surgical History:  Procedure Laterality Date  . KNEE  ARTHROSCOPY    . NO PAST SURGERIES         Home Medications    Prior to Admission medications   Medication Sig Start Date End Date Taking? Authorizing Provider  albuterol (PROVENTIL HFA;VENTOLIN HFA) 108 (90 Base) MCG/ACT inhaler Inhale 1-2 puffs into the lungs every 6 (six) hours as needed for wheezing. 09/25/15   Tanna Furry, MD  albuterol (PROVENTIL) (2.5 MG/3ML) 0.083% nebulizer solution Take 2.5 mg by nebulization every 6 (six) hours as needed for wheezing or shortness of breath. 07/27/16   [provider]  allopurinol (ZYLOPRIM) 300 MG tablet Take 300 mg by mouth daily.    [provider]  aspirin 81 MG EC tablet Take 81 mg by mouth daily. Swallow whole.    [provider]  atorvastatin (LIPITOR) 20 MG tablet Take 20 mg by mouth every morning.  10/15/16   [provider]  BREO ELLIPTA 100-25 MCG/INH AEPB INL 1 PUFF PO D Patient taking differently: Inhale 1 puff into the lungs daily.  08/17/16   Brand Males, MD  diazepam (VALIUM) 5 MG tablet Take as directed prior to MRI 12/16/16   Marybelle Killings, MD  Dulaglutide 0.75 MG/0.5ML SOPN Inject 0.75 mg into the skin. 12/08/16   [provider]  DULoxetine (CYMBALTA) 60 MG capsule Take 60 mg by mouth daily.    [provider]  Fluticasone-Umeclidin-Vilant 100-62.5-25 MCG/INH AEPB Inhale 1 puff into the lungs daily. 08/07/16   [provider]  furosemide (LASIX) 20 MG tablet Take 20 mg by mouth 2 (two) times daily.  03/21/13   [provider]  gabapentin (NEURONTIN) 300 MG capsule Take 300 mg by mouth 3 (three) times daily.  10/18/14   [provider]  linagliptin (TRADJENTA) 5 MG TABS tablet Take 5 mg by mouth. 09/30/16   [provider]  losartan (COZAAR) 100 MG tablet Take 100 mg by mouth daily.  10/15/16   [provider]  omeprazole (PRILOSEC) 20 MG capsule Take 20 mg by mouth daily.  07/19/14   [provider]  OXYGEN Inhale 2 L into the lungs  as needed.    [provider]  potassium chloride SA (K-DUR,KLOR-CON) 20 MEQ tablet Take 1 tablet (20 mEq total) by mouth 2 (two) times daily. 05/03/15   Volanda Napoleon, MD  primidone (MYSOLINE) 50 MG tablet Take 50 mg by mouth daily.  06/30/16 06/30/17  [provider]  tamsulosin (FLOMAX) 0.4 MG CAPS capsule Take 1 capsule (0.4 mg total) by mouth daily after breakfast. 12/06/15   Volanda Napoleon, MD  testosterone cypionate (DEPOTESTOSTERONE CYPIONATE) 200 MG/ML injection Inject 200 mg into the muscle every 14 (fourteen) days.     [provider]  tiotropium (SPIRIVA) 18 MCG inhalation capsule Place 1 capsule (18 mcg total) into inhaler and inhale daily. 07/22/15   Parrett, Fonnie Mu, NP  tiZANidine (ZANAFLEX) 4 MG tablet 4 mg 3 (three) times daily as needed for muscle spasms.  10/04/14   [provider]  traMADol (ULTRAM) 50 MG tablet Take 50 mg by mouth daily as needed for moderate pain.  09/08/13   [provider]  umeclidinium bromide (INCRUSE ELLIPTA) 62.5 MCG/INH AEPB Inhale 1 puff into  the lungs daily. 08/17/16   Brand Males, MD  zolpidem (AMBIEN) 10 MG tablet Take 10 mg by mouth at bedtime.  09/02/15   [provider]    Family History Family History  Problem Relation Age of Onset  . Heart disease Mother   . Diabetes Father   . Stroke Brother     Social History Social History  Substance Use Topics  . Smoking status: Former Smoker    Packs/day: 1.00    Years: 30.00    Types: Cigarettes    Start date: 08/05/1983    Quit date: 10/11/2016  . Smokeless tobacco: Never Used     Comment: quit smoking 4 months ago  . Alcohol use No     Allergies   Patient has no known allergies.   Review of Systems Review of Systems All other systems are reviewed and are negative for acute change except as noted in the HPI   Physical Exam Updated Vital Signs BP (!) 145/110   Pulse (!) 57   Temp 97.9 F (36.6 C)   Resp 17   Ht 5\' 6"   (1.676 m)   Wt 98 kg (216 lb 0.8 oz)   SpO2 93%   BMI 34.87 kg/m   Physical Exam  Constitutional: He is oriented to person, place, and time. He appears well-developed and well-nourished. No distress.  HENT:  Head: Normocephalic and atraumatic.  Nose: Nose normal.  Eyes: Conjunctivae and EOM are normal. Pupils are equal, round, and reactive to light. Right eye exhibits no discharge. Left eye exhibits no discharge. No scleral icterus.  Neck: Normal range of motion. Neck supple.  Cardiovascular: Normal rate and regular rhythm.  Exam reveals no gallop and no friction rub.   No murmur heard. Pulmonary/Chest: Effort normal and breath sounds normal. No stridor. No respiratory distress. He has no rales.  Abdominal: Soft. He exhibits no distension. There is no tenderness.  Musculoskeletal: He exhibits no edema or tenderness.  Neurological: He is alert and oriented to person, place, and time.  Mental Status: Alert and oriented to person, place, and time. Attention and concentration normal. Speech with slurring. Recent memory is intact  Cranial Nerves  II Visual Fields: Intact to confrontation. Visual fields intact. III, IV, VI: Pupils equal and reactive to light and near. Full eye movement without nystagmus  V Facial Sensation: Normal. No weakness of masticatory muscles  VII: No facial weakness or asymmetry  VIII Auditory Acuity: Grossly normal  IX/X: The uvula is midline; the palate elevates symmetrically  XI: Normal sternocleidomastoid and trapezius strength  XII: The tongue is midline. No atrophy or fasciculations.   Motor System: Muscle Strength: 4/5 strength in the left upper and lower extremities. Pronator drift with the left. 5 out of 5 strength on the right Muscle Tone: Tone and muscle bulk are normal in the upper and lower extremities.   Reflexes: DTRs: 1+ and symmetrical in all four extremities. Plantar responses are flexor bilaterally.  Coordination:  No tremor.  Sensation: Intact  to light touch, and pinprick.  Gait: deferred.    Skin: Skin is warm and dry. No rash noted. He is not diaphoretic. No erythema.  Psychiatric: He has a normal mood and affect.  Vitals reviewed.    ED Treatments / Results  Labs (all labs ordered are listed, but only abnormal results are displayed) Labs Reviewed  I-STAT CHEM 8, ED - Abnormal; Notable for the following:       Result Value   Calcium, Ion 1.12 (*)  All other components within normal limits  PROTIME-INR  APTT  CBC  DIFFERENTIAL  COMPREHENSIVE METABOLIC PANEL  I-STAT TROPOININ, ED  CBG MONITORING, ED    EKG  EKG Interpretation None       Radiology Ct Head Code Stroke W/o Cm  Result Date: 12/22/2016 CLINICAL DATA:  Code stroke.  Code stroke, left-sided weakness EXAM: CT HEAD WITHOUT CONTRAST TECHNIQUE: Contiguous axial images were obtained from the base of the skull through the vertex without intravenous contrast. COMPARISON:  CT head 09/16/2016 FINDINGS: Brain: Negative for acute infarct, hemorrhage, mass. Ventricle size normal. Patchy hypodensity in the cerebral white matter bilaterally appears chronic. Vascular: Negative for hyperdense vessel. Skull: Negative Sinuses/Orbits: Mild mucosal edema in the frontal sinuses. Remaining sinuses clear. Normal orbit. Other: None ASPECTS ([place] Stroke Program Early CT Score) - Ganglionic level infarction (caudate, lentiform nuclei, internal capsule, insula, M1-M3 cortex): 7 - Supraganglionic infarction (M4-M6 cortex): 3 Total score (0-10 with 10 being normal): 10 IMPRESSION: 1. No acute intracranial abnormality 2. ASPECTS is 10 Electronically Signed   By: Franchot Gallo M.D.   On: 12/22/2016 11:45    Procedures Procedures (including critical care time)  Medications Ordered in ED Medications  iopamidol (ISOVUE-370) 76 % injection (140 mLs Intravenous Contrast Given 12/22/16 1219)     Initial Impression / Assessment and Plan / ED Course  I have reviewed the triage  vital signs and the nursing notes.  Pertinent labs & imaging results that were available during my care of the patient were reviewed by me and considered in my medical decision making (see chart for details).  Clinical Course as of Dec 23 1510  Tue Dec 22, 2016  1136 Left-sided deficits, last known normal 1205 last night. Included as a code STEMI in route by EMS. On arrival patient has definite left-sided deficits. Taken to CT scan.  [PC]  6761 No acute findings on CT. Labs grossly reassuring. Patient had a candidate for TPA at this time. We'll admit to medicine for stroke workup and management.  [PC]    Clinical Course User Index [PC] Kesha Hurrell, Grayce Sessions, MD      Final Clinical Impressions(s) / ED Diagnoses   Final diagnoses:  Left-sided weakness  Left-sided weakness  Left-sided weakness      Jayson Waterhouse, Grayce Sessions, MD 12/22/16 1512

## 2016-12-22 NOTE — ED Notes (Signed)
Removed ice from RAC.  No change from previous assessment.  Pt denies any pain to site.

## 2016-12-22 NOTE — H&P (Signed)
Date: 12/22/2016               Patient Name:  James Zuniga MRN: [id number]  DOB: 04/08/1956 Age / Sex: 61 y.o., male   PCP: Berkley Harvey, NP         Medical Service: Internal Medicine Teaching Service         Attending Physician: Dr. Lucious Groves, DO    First Contact: Dr. Jari Favre Pager: [phone]  Second Contact: Dr. Juleen China Pager: [phone]       After Hours (After 5p/  First Contact Pager: [phone]  weekends / holidays): Second Contact Pager: [phone]   Chief Complaint: left sided weakness  History of Present Illness:  James Zuniga is a 61yo male with PMH of NICM, CHF (Echo 5/18 EF 35-40% G1DD, clear cath in 2013, normal myoview 6/18), OSA, COPD, T2DM, HTN, HLD, GERD, chronic back pain, osteoarthritis, presenting from his PCP's office to [facility] with left sided weakness. Patient states that he went to bed at around midnight last night feeling like his normal self; at around 1:30am he woke up, noticed he had trouble adjusting his CPAP mask and dropped a glass into his sink when getting a drink. He went back to sleep and when his wife came home from work, she took him to his PCP to get evaluated. His PCP noted left sided deficits and had an ambulance transport him to the ED for evaluation for stroke.   Patient endorses left arm and leg weakness and numbness since early this morning, and new right sided headache for him in the last week or so. He endorses some forgetfulness while in the ambulance that has now resolved. He has apparently been to about 5 different doctors this week and his wife notes BP has been in the 140-150's/100s which is abnormal for him. He endorses compliance with his medicines, denies trauma or falls, denies syncope. He endorses intermittent episodes of chest pain at rest that is pressure for which he recently had a normal myoview per his cardiologist. He denies hearing or vision changes, he may have had some dysarthria that is resolved now, denies dysphagia, fevers,  chills, abdominal pain, nausea, vomiting, dizziness.  Meds:  Current Meds  Medication Sig  . albuterol (PROVENTIL HFA;VENTOLIN HFA) 108 (90 Base) MCG/ACT inhaler Inhale 1-2 puffs into the lungs every 6 (six) hours as needed for wheezing.  [place] Kitchen albuterol (PROVENTIL) (2.5 MG/3ML) 0.083% nebulizer solution Take 2.5 mg by nebulization every 6 (six) hours as needed for wheezing or shortness of breath.  . allopurinol (ZYLOPRIM) 300 MG tablet Take 300 mg by mouth daily.  [place] Kitchen aspirin 81 MG EC tablet Take 81 mg by mouth daily. Swallow whole.  [place] Kitchen atorvastatin (LIPITOR) 20 MG tablet Take 20 mg by mouth every morning.   [place] Kitchen BREO ELLIPTA 100-25 MCG/INH AEPB INL 1 PUFF PO D (Patient taking differently: Inhale 1 puff into the lungs daily. )  . Dulaglutide 0.75 MG/0.5ML SOPN Inject 0.75 mg into the skin every 7 (seven) days. Tuesday of each week  . DULoxetine (CYMBALTA) 60 MG capsule Take 60 mg by mouth daily.  . furosemide (LASIX) 20 MG tablet Take 20 mg by mouth 2 (two) times daily.   [place] Kitchen gabapentin (NEURONTIN) 300 MG capsule Take 300 mg by mouth 3 (three) times daily.   [place] Kitchen losartan (COZAAR) 100 MG tablet Take 100 mg by mouth daily.   [place] Kitchen omeprazole (PRILOSEC) 20 MG capsule Take 20 mg by mouth daily.   Marland Kitchen  potassium chloride SA (K-DUR,KLOR-CON) 20 MEQ tablet Take 1 tablet (20 mEq total) by mouth 2 (two) times daily.  . primidone (MYSOLINE) 50 MG tablet Take 50 mg by mouth daily.   . tamsulosin (FLOMAX) 0.4 MG CAPS capsule Take 1 capsule (0.4 mg total) by mouth daily after breakfast.  . testosterone cypionate (DEPOTESTOSTERONE CYPIONATE) 200 MG/ML injection Inject 200 mg into the muscle every 14 (fourteen) days.   [place] Kitchen tiZANidine (ZANAFLEX) 4 MG tablet 4 mg 3 (three) times daily as needed for muscle spasms.   . traMADol (ULTRAM) 50 MG tablet Take 50 mg by mouth daily as needed for moderate pain.   [place] Kitchen umeclidinium bromide (INCRUSE ELLIPTA) 62.5 MCG/INH AEPB Inhale 1 puff into the lungs daily.  [place] Kitchen zolpidem (AMBIEN) 10 MG tablet  Take 10 mg by mouth at bedtime.    Allergies: Allergies as of 12/22/2016  . (No Known Allergies)   Past Medical History:  Diagnosis Date  . CHF (congestive heart failure) ([place])   . Chronic airway obstruction ([place])   . Chronic pain   . Coronary artery disease   . Diabetes mellitus   . Disorder of liver   . Esophageal reflux   . Gout   . Hyperlipidemia   . Hypertension   . Hypokalemia   . Hypokalemia 11/2016  . Hypotestosteronism 07/01/2013  . Insomnia   . Iron deficiency anemia, unspecified 07/01/2013  . Memory loss   . PVD (peripheral vascular disease) ([place])   . Renal disorder   . Vitamin D deficiency     Family History: Brother with stroke at 72; brother with CHF in 63's; multiple family members with strokes in their 71's.   Social History: Former smoker, 40+ pack years; denies alcohol or illicit drug use; disabled due to back injury  Review of Systems: A complete ROS was negative except as per HPI.   Physical Exam: Blood pressure (!) 143/98, pulse 73, temperature 97.9 F (36.6 C), resp. rate 19, height 5\' 6"  (1.676 m), weight 216 lb 0.8 oz (98 kg), SpO2 92 %. General: alert, well-developed, and cooperative to examination.  Head: normocephalic and atraumatic.  Eyes: vision grossly intact, pupils equal, pupils round, pupils reactive to light, no injection and anicteric.  Mouth: pharynx pink and moist, no erythema, and no exudates.  Neck: supple, full ROM, no thyromegaly.  Lungs: normal respiratory effort, no accessory muscle use, normal breath sounds, no crackles, and no wheezes. Heart: distant heart sounds, normal rate, regular rhythm, no murmur, no gallop, and no rub.  Abdomen: soft, non-tender, normal bowel sounds, no distention, no guarding, no rebound tenderness.  Msk: no joint swelling, no joint warmth, and no redness over joints.  Pulses: 2+ DP/PT pulses bilaterally Extremities: No cyanosis, clubbing; mild pitting edema bil LE; right arm wrapped with  compression due to IV infiltration Neurologic: alert & oriented X3, cranial nerves II-XII intact, RU/L extremity strength and sensation intact, LU/L ext strength 4/5 and slightly decreased sensation; heel to shin normal bilaterally; abn finger to nose on left; lack of nasolabial fold on right.  Skin: turgor normal and no rashes.  Psych: Oriented X3, memory intact for recent and remote, normally interactive, good eye contact, not anxious appearing, and not depressed appearing  LABS: CBG 70 Na 138, K 3.7, Cl 104, CO2 26, BUN 7, Cr 1.16, glu 81 WBC 8.1, Hgb 14.7, Hct 46, Plts 288 PT 12.9, APTT 29, INR 0.97  EKG: personally reviewed - NSR, no acute ST or T wave changes  CT head  12/22/16: Negative for acute infarct, hemorrhage, or mass; Patchy hypodensity in the cerebral white matter bilaterally appears chronic  CTA head/neck; CT cerebral perfusion 12/22/16: CT perfusion study degraded due to motion artifact; no emergent large vessel occlusion;  Assessment & Plan by Problem: Active Problems:   Congestive heart failure (HCC)   Hypertension   Hyperlipidemia   Type 2 diabetes mellitus (HCC)   Chronic back pain   COPD (chronic obstructive pulmonary disease) (HCC)   Stroke (HCC)  CVA:  Patient with new onset left upper and lower extremity weakness and numbness with last presumed normal ~ midnight on day of admission; risk factors include T2DM, HTN, HLD, h/o smoking and family history of CVAs in 24's. No known autoimmune or heme disorder. No known malignancy. CT head was negative; CTA was largely negative but motion degraded, no large vessel or carotid occlusions noted. Neurology was consulted in ED and evaluated patient. He had a recent echo in May 2018 which showed EF 35-40%; idiopathic NICM; no arrhythmia on EKG. --ASA --atorvastatin --MRI/A of brain, echo --A1c, lipid panel, UDS --Neuro consulted - appreciate their assistance --permissive hypertension <220/110 --PT/OT/SLP  consults  NICM HTN: Patient with idiopathic NICM with clear cardiac cath in 2013, normal myoview in May 2018. At home on losartan 100mg  daily; he was on metoprolol but d/c'd due to symptomatic bradycardia at last admission with no plans from his cardiologist to restart. --atorvastatin --asa --hold losartan for permissive hypertension for now --echo  Combined CHF: Patient with last echo in May 2018 showing EF 92-33%, grade 1 diastolic dysfunction, severe inferior and inferoseptal hypokinesis. At home on lasix 30mg  BID, potassium 41mEq BID, losartan 100mg  daily. --hold losartan as above --hold lasix for now as appears close to euvolemic, may restart in AM --follow bmets  T2DM: Patient with last A1c of 7.8 in May 2018 on Dulaglutide 0.75mg  weekly at home.  --Check A1c --SSI-M ac/hs  COPD: On Breo and Incruse at home; has PRN 2L [place] oxygen which he rarely uses. No wheezing on exam. --Continue home meds --albuterol PRN  OSA: --CPAP at night  GERD: Patient continued on PPI.  BPH: Patient continued on tamsulosin 0.4mg  daily  H/o gout: Patient continued on allopurinol 300mg  daily.  Diet: CM IVF: None VTE ppx: lovenox Code: FULL   Dispo: Admit patient to Inpatient with expected length of stay greater than 2 midnights.  Signed: Alphonzo Grieve, MD 12/22/2016, 3:18 PM  Pager [phone]

## 2016-12-23 DIAGNOSIS — I5022 Chronic systolic (congestive) heart failure: Secondary | ICD-10-CM

## 2016-12-23 DIAGNOSIS — R531 Weakness: Secondary | ICD-10-CM

## 2016-12-23 DIAGNOSIS — I6349 Cerebral infarction due to embolism of other cerebral artery: Secondary | ICD-10-CM

## 2016-12-23 DIAGNOSIS — I63019 Cerebral infarction due to thrombosis of unspecified vertebral artery: Secondary | ICD-10-CM

## 2016-12-23 DIAGNOSIS — I110 Hypertensive heart disease with heart failure: Secondary | ICD-10-CM

## 2016-12-23 LAB — COMPREHENSIVE METABOLIC PANEL
ALT: 17 U/L (ref 17–63)
AST: 18 U/L (ref 15–41)
Albumin: 3.2 g/dL — ABNORMAL LOW (ref 3.5–5.0)
Alkaline Phosphatase: 75 U/L (ref 38–126)
Anion gap: 10 (ref 5–15)
BILIRUBIN TOTAL: 0.5 mg/dL (ref 0.3–1.2)
BUN: 9 mg/dL (ref 6–20)
CHLORIDE: 100 mmol/L — AB (ref 101–111)
CO2: 29 mmol/L (ref 22–32)
CREATININE: 1.22 mg/dL (ref 0.61–1.24)
Calcium: 9.3 mg/dL (ref 8.9–10.3)
Glucose, Bld: 72 mg/dL (ref 65–99)
Potassium: 3.1 mmol/L — ABNORMAL LOW (ref 3.5–5.1)
Sodium: 139 mmol/L (ref 135–145)
TOTAL PROTEIN: 6.1 g/dL — AB (ref 6.5–8.1)

## 2016-12-23 LAB — GLUCOSE, CAPILLARY
GLUCOSE-CAPILLARY: 120 mg/dL — AB (ref 65–99)
GLUCOSE-CAPILLARY: 92 mg/dL (ref 65–99)
Glucose-Capillary: 114 mg/dL — ABNORMAL HIGH (ref 65–99)
Glucose-Capillary: 106 mg/dL — ABNORMAL HIGH (ref 65–99)

## 2016-12-23 LAB — CBC
HCT: 46 % (ref 39.0–52.0)
Hemoglobin: 14.3 g/dL (ref 13.0–17.0)
MCH: 28.5 pg (ref 26.0–34.0)
MCHC: 31.1 g/dL (ref 30.0–36.0)
MCV: 91.8 fL (ref 78.0–100.0)
PLATELETS: 274 10*3/uL (ref 150–400)
RBC: 5.01 MIL/uL (ref 4.22–5.81)
RDW: 14.7 % (ref 11.5–15.5)
WBC: 6.9 10*3/uL (ref 4.0–10.5)

## 2016-12-23 MED ORDER — POTASSIUM CHLORIDE CRYS ER 20 MEQ PO TBCR
40.0000 meq | EXTENDED_RELEASE_TABLET | Freq: Two times a day (BID) | ORAL | Status: AC
  Administered 2016-12-23 (×2): 40 meq via ORAL
  Filled 2016-12-23 (×2): qty 2

## 2016-12-23 MED ORDER — CLOPIDOGREL BISULFATE 75 MG PO TABS
75.0000 mg | ORAL_TABLET | Freq: Every day | ORAL | Status: DC
  Administered 2016-12-23 – 2016-12-25 (×2): 75 mg via ORAL
  Filled 2016-12-23 (×2): qty 1

## 2016-12-23 MED ORDER — ASPIRIN EC 81 MG PO TBEC
81.0000 mg | DELAYED_RELEASE_TABLET | Freq: Every day | ORAL | Status: DC
Start: 2016-12-23 — End: 2016-12-25
  Administered 2016-12-23 – 2016-12-25 (×2): 81 mg via ORAL
  Filled 2016-12-23 (×2): qty 1

## 2016-12-23 MED ORDER — ZOLPIDEM TARTRATE 5 MG PO TABS
5.0000 mg | ORAL_TABLET | Freq: Every day | ORAL | Status: AC
  Administered 2016-12-23: 5 mg via ORAL
  Filled 2016-12-23: qty 1

## 2016-12-23 NOTE — Progress Notes (Signed)
Subjective: No acute overnight events. Pt. States he feels same as presentation in terms of weakness. No complaints of vision changes. No new complaints.   Objective: Vital signs in last 24 hours: Vitals:   12/23/16 0200 12/23/16 0400 12/23/16 0615 12/23/16 0904  BP: (!) 132/98 (!) 132/112 (!) 150/98 (!) 152/102  Pulse: (!) 107 71 68 78  Resp: 18 16 18 20   Temp: (!) 96.8 F (36 C) 97 F (36.1 C) 98.2 F (36.8 C) 98.2 F (36.8 C)  TempSrc: Axillary Axillary Axillary Oral  SpO2: 100% 97% 97% 100%  Weight:      Height:       Gen: Well-appearing, no acute distress.  CV: RRR, no MRG Pulm: CTAB Neuro: No left upper extremity spasticity on strength test. Decreased left pronator drift. Mild sensation deficit in left V1, V3 distribution. Minor dysmetria with left finger-to-nose test. No dysdiadochokinesia left or right upper or lower extremities. Otherwise CN II-XII intact bilaterally. Extraocular movements intact, symmetric face, tongue midline. 5/5 strength right upper and lower extremities. 4/5 strength left upper and lower extremities.  Normal sensation right upper and lower extremities. Decreased sensation in left upper and lower extremities. Extremities: 1+ pitting edema lower extremities bilaterally  Lab Results: Basic Metabolic Panel:  Recent Labs Lab 12/22/16 1128 12/22/16 1133 12/23/16 0443  NA 138 140 139  K 3.7 3.8 3.1*  CL 104 103 100*  CO2 26  --  29  GLUCOSE 81 76 72  BUN 7 8 9   CREATININE 1.16 1.10 1.22  CALCIUM 9.5  --  9.3   CBC:  Recent Labs Lab 12/18/16 0804  12/22/16 1128 12/22/16 1133 12/23/16 0443  WBC 7.4  --  8.1  --  6.9  NEUTROABS 4.8  --  5.1  --   --   HGB 15.2  < > 14.7 16.0 14.3  HCT 46.5  < > 46.0 47.0 46.0  MCV 91  --  91.5  --  91.8  PLT 291  --  288  --  274  < > = values in this interval not displayed.  CBG:  Recent Labs Lab 12/22/16 1125 12/22/16 1640 12/22/16 2343 12/23/16 0820  GLUCAP 70 71 78 120*    Coagulation:  Recent Labs Lab 12/22/16 1128  LABPROT 12.9  INR 0.97   Anemia Panel:  Recent Labs Lab 12/18/16 0805  FERRITIN 180  TIBC 264  IRON 50     Studies/Results: Medications:  Scheduled Meds: .  stroke: mapping our early stages of recovery book   Does not apply Once  . allopurinol  300 mg Oral Daily  . aspirin EC  81 mg Oral Daily  . atorvastatin  80 mg Oral q1800  . clopidogrel  75 mg Oral Daily  . DULoxetine  60 mg Oral Daily  . enoxaparin (LOVENOX) injection  40 mg Subcutaneous Q24H  . fluticasone furoate-vilanterol  1 puff Inhalation Daily  . gabapentin  300 mg Oral TID  . insulin aspart  0-15 Units Subcutaneous TID WC  . insulin aspart  0-5 Units Subcutaneous QHS  . pantoprazole  40 mg Oral Daily  . potassium chloride  40 mEq Oral BID  . primidone  50 mg Oral Daily  . tamsulosin  0.4 mg Oral QPC breakfast  . umeclidinium bromide  1 puff Inhalation Daily   Continuous Infusions: PRN Meds:.acetaminophen **OR** [DISCONTINUED] acetaminophen (TYLENOL) oral liquid 160 mg/5 mL **OR** acetaminophen, albuterol, senna-docusate Assessment/Plan: Active Problems:   Congestive heart failure (HCC)   Hypertension  Hyperlipidemia   Type 2 diabetes mellitus (HCC)   Chronic back pain   COPD (chronic obstructive pulmonary disease) ([place])   Stroke [facility])  [name] is a 61 y/o right-handed man with PMH of CHF (EF 35-40%), COPD (CPAP for sleep, 2L O2 PRN), hypertension, iron deficiency anemia, and left sciatica (occupational back injury 30 yrs. Ago) being managed for right MCA ischemic stroke.  Left-sided weakness: MRI of brain significant for acute small right frontoparietal/MCA territory infarct, as well as moderate chronic small vessel ischemic disease. Embolic vs. Thrombotic still unclear. Neurology further investigating, we appreciate their assistance with this case. Pt. Same condition, possibly some improvement in strength.   -TEE per Neurology -Aspirin 325 mg  QD -Lovenox DVT ppx -Atorvastatin 80 mg QD -PT/OT and SLP consults  HTN: From recent clinic visits, BP run upper 130s/upper 80s. BP 140s/100s on admission, stable.  -Hold home losartan 100 mg, allow for permissive hypertension  CHF: EF 35-40% May 8889, grade 1 diastolic dysfunction. Pt. Denies dyspnea, SOB. Mild pitting edema on exam. Hx. Of hypokalemia. Stable.  -hold home furosemide 20 mg BID, allow for permissive hypertension -hold home Potassium chloride  COPD: 2017 PFTs show FVC (79%), FEV1 (70%), FEV1/FVC (110%). Pt. Uses CPAP at night, 2L O2 PRN during day. Currently no SOB, difficulty breathing, cough/sputum, and exam reveals clear lungs bilaterally.  -Home albuterol, Breo ellipta, fluticasone/umeclidin/vilant, tiotropium  Gout: stable.  -home allopurinol 300 mg qd  Sciatica: Hx. Of sciatica of left leg secondary to back injury 30 years ago. Stable.  -home tizanidine 4 mg q8h prn -home duloxetine 60 mg Qd  -gabapentin 300 mg TID  Diabetes mellitus 2: Pt. Reports blood glucose has recently been in the 200s. Last A1C 7.8 in May 2018. Glucose 70 at admission.   -SSI -Hold home medications   Bladder outlet obstruction: Denies dysuria or difficulty starting or stopping stream.  -Home tamsulosin 0.4 mg QD  Dispo: floor GI/FEN: heart healthy/carb modified DVT ppx: lovenox   This is a Careers information officer Note.  The care of the patient was discussed with Dr. Jari Favre and the assessment and plan formulated with their assistance.  Please see their attached note for official documentation of the daily encounter.   LOS: 1 day   Jillyn Hidden, Medical Student 12/23/2016, 11:49 AM

## 2016-12-23 NOTE — Progress Notes (Signed)
   Subjective:  Patient with no complaints today; he states he had a frontal headache earlier but not anymore. He feels like his weakness might be improving. He denies vision changes or any new neurological symptoms.  Objective:  Vital signs in last 24 hours: Vitals:   12/23/16 0200 12/23/16 0400 12/23/16 0615 12/23/16 0904  BP: (!) 132/98 (!) 132/112 (!) 150/98 (!) 152/102  Pulse: (!) 107 71 68 78  Resp: 18 16 18 20   Temp: (!) 96.8 F (36 C) 97 F (36.1 C) 98.2 F (36.8 C) 98.2 F (36.8 C)  TempSrc: Axillary Axillary Axillary Oral  SpO2: 100% 97% 97% 100%  Weight:      Height:       Constitutional: NAD, pleasant CV: RRR, no mrg, no LE edema, pulses intact Resp: CTAB, no increased work of breathing CBS:WHQP Neuro: CN 2-12 intact other than still mild right facial droop. LLE 4/5 strength, LUE 4/5 but better from yesterday.   Assessment/Plan:  Active Problems:   Congestive heart failure (HCC)   Hypertension   Hyperlipidemia   Type 2 diabetes mellitus (HCC)   Chronic back pain   COPD (chronic obstructive pulmonary disease) (HCC)   Stroke (HCC)  CVA:  Patient with new onset left upper and lower extremity weakness and numbness with last presumed normal ~ midnight on day of admission; risk factors include T2DM, HTN, HLD, h/o smoking and family history of CVAs in 64's. No known autoimmune or heme disorder. No known malignancy. CT head was negative; CTA was largely negative but motion degraded, no large vessel or carotid occlusions noted. Neurology was consulted in ED and evaluated patient. He had a recent echo in May 2018 which showed EF 35-40%; idiopathic NICM; no arrhythmia on EKG. MRI shows acute small right frontoparietal/MCA territory infarct; MRA is motion degraded but does not show acute large vessel occlusion or severe stenosis. Neurology has requested a TEE to evaluate for cardiac source of thrombus. --ASA, start plavix --atorvastatin --A1c, lipid panel --Neuro consulted  - appreciate their assistance --permissive hypertension <220/110 --PT/OT/SLP consults  NICM HTN: Patient with idiopathic NICM with clear cardiac cath in 2013, normal myoview in May 2018. At home on losartan 100mg  daily; he was on metoprolol but d/c'd due to symptomatic bradycardia at last admission with no plans from his cardiologist to restart. --atorvastatin --asa --hold losartan for permissive hypertension for now; restart on discharge  Combined CHF: Patient with last echo in May 2018 showing EF 59-16%, grade 1 diastolic dysfunction, severe inferior and inferoseptal hypokinesis. At home on lasix 30mg  BID, potassium 31mEq BID, losartan 100mg  daily. --hold losartan as above --hold lasix for now as appears close to euvolemic, may restart in AM --follow bmets  T2DM: Patient with last A1c of 7.8 in May 2018 on Dulaglutide 0.75mg  weekly at home.  --Check A1c --SSI-M ac/hs  COPD: On Breo and Incruse at home; has PRN 2L [place] oxygen which he rarely uses. No wheezing on exam. --Continue home meds --albuterol PRN  OSA: --CPAP at night  GERD: Patient continued on PPI.  BPH: Patient continued on tamsulosin 0.4mg  daily  H/o gout: Patient continued on allopurinol 300mg  daily  Dispo: Anticipated discharge in approximately 1-2 day(s).   Alphonzo Grieve, MD 12/23/2016, 11:35 AM Pager [phone]

## 2016-12-23 NOTE — Evaluation (Signed)
Occupational Therapy Evaluation Patient Details Name: James Zuniga MRN: [id number] DOB: [birth year] Today's Date: 12/23/2016    History of Present Illness Mr. Deeb is a 61 y/o male with PMH of CHF, COPD (CPAP for sleep, 2L O2 PRN), hypertension, iron deficiency anemia, and left sciatica who presented to the ED on 12/22/16 with left sided sensory changes and weakness. MRI revealed Acute small RIGHT frontoparietal/MCA territory infarct.   Clinical Impression   PTA Pt independent in ADL and used SPC for mobility. Pt currently supervision for ADL and min guard for stand pivot transfer. Pt with deficits in LUE (non-dominant hand) decreased fine motor, gross motor, and sensation. BUt functionally able to make fist, open to extension, hand to mouth, touch back of head and lower back (with increased time). Pt will benefit from skilled OT in the acute setting and OUtpatient OT upon discharge to maximize safety and independence in ADL and maximize function/rehab of LUE. Next session to focus on establishing HEP for LUE for fine and gross motor.     Follow Up Recommendations  Outpatient OT;Supervision - Intermittent    Equipment Recommendations  Tub/shower seat    Recommendations for Other Services       Precautions / Restrictions Precautions Precautions: Fall Restrictions Weight Bearing Restrictions: No      Mobility Bed Mobility Overal bed mobility: Modified Independent             General bed mobility comments: flat bad, use of rails to assist  Transfers Overall transfer level: Needs assistance Equipment used: None Transfers: Stand Pivot Transfers;Sit to/from Stand Sit to Stand: Min guard Stand pivot transfers: Min guard       General transfer comment: min guard for safety, cane close by, but Pt elected to use furniture for steadying balance    Balance Overall balance assessment: Needs assistance Sitting-balance support: No upper extremity supported;Feet supported Sitting  balance-Leahy Scale: Good Sitting balance - Comments: sitting EOB for LB dressing task   Standing balance support: Single extremity supported Standing balance-Leahy Scale: Fair Standing balance comment: for transfer                           ADL either performed or assessed with clinical judgement   ADL Overall ADL's : Needs assistance/impaired Eating/Feeding: Set up;Sitting Eating/Feeding Details (indicate cue type and reason): ice cream and lunch, able to open containers, small "Ms Deliah [place] packet" Grooming: Set up;Sitting   Upper Body Bathing: Set up;Sitting   Lower Body Bathing: Supervison/ safety;Sit to/from stand Lower Body Bathing Details (indicate cue type and reason): talked in depth about this as Pt does not feel he will have the balance to be safe, discussed shower mat for extra grip on the floor as well Upper Body Dressing : Supervision/safety;Sitting   Lower Body Dressing: Min guard;Sit to/from stand Lower Body Dressing Details (indicate cue type and reason): Pt able to don/doff socks sitting EOB Toilet Transfer: Min Designer, jewellery Details (indicate cue type and reason): simulated with bed to recliner Toileting- Clothing Manipulation and Hygiene: Min guard;Sit to/from stand   Tub/ Shower Transfer: Tub transfer;Minimal assistance;Shower seat   Functional mobility during ADLs: Min guard;Cane (stand pivot only this session)       Vision Baseline Vision/History:  ("I think I'm starting to need glasses") Patient Visual Report: No change from baseline Vision Assessment?: Yes Eye Alignment: Within Functional Limits Ocular Range of Motion: Within Functional Limits Alignment/Gaze Preference: Within Defined Limits Tracking/Visual Pursuits: Able  to track stimulus in all quads without difficulty Convergence: Within functional limits Visual Fields: No apparent deficits     Perception     Praxis      Pertinent Vitals/Pain Pain Assessment:  Faces Faces Pain Scale: Hurts little more Pain Location: head when bending over Pain Descriptors / Indicators: Headache Pain Intervention(s): Monitored during session;Repositioned     Hand Dominance Right   Extremity/Trunk Assessment Upper Extremity Assessment Upper Extremity Assessment: LUE deficits/detail LUE Deficits / Details: grossly 4/5 strength, able to make full fist and full extension, increased time to perform tasks LUE Sensation: decreased light touch;decreased proprioception LUE Coordination: decreased fine motor;decreased gross motor   Lower Extremity Assessment Lower Extremity Assessment: Defer to PT evaluation   Cervical / Trunk Assessment Cervical / Trunk Assessment: Normal   Communication Communication Communication: No difficulties   Cognition Arousal/Alertness: Awake/alert Behavior During Therapy: WFL for tasks assessed/performed Overall Cognitive Status: Within Functional Limits for tasks assessed                                     General Comments  Pt concerned about cost of OPOT when discussing the plan going forward    Exercises     Shoulder Instructions      Home Living Family/patient expects to be discharged to:: Private residence Living Arrangements: Spouse/significant other Available Help at Discharge: Family;Available PRN/intermittently (wife works at night, daughters live in development) Type of Home: Apartment Home Access: Level entry     Home Layout: One level     Bathroom Shower/Tub: Teacher, early years/pre: Standard Bathroom Accessibility: Yes How Accessible: Accessible via walker Home Equipment: Kasandra Knudsen - single point   Additional Comments: moving apts on July 20th, will have 7 steps at that point      Prior Functioning/Environment Level of Independence: Independent with assistive device(s)        Comments: used SPC for mobility        OT Problem List: Decreased strength;Decreased range of  motion;Impaired balance (sitting and/or standing);Decreased coordination;Decreased knowledge of use of DME or AE;Impaired sensation;Impaired UE functional use      OT Treatment/Interventions: Self-care/ADL training;Therapeutic exercise;Neuromuscular education;DME and/or AE instruction;Therapeutic activities;Patient/family education;Balance training    OT Goals(Current goals can be found in the care plan section) Acute Rehab OT Goals Patient Stated Goal: to get back as much use of LUE as possible OT Goal Formulation: With patient Time For Goal Achievement: 01/06/17 Potential to Achieve Goals: Good ADL Goals Pt Will Perform Grooming: with modified independence;standing Pt Will Transfer to Toilet: with modified independence;ambulating (with SPC) Pt Will Perform Toileting - Clothing Manipulation and hygiene: with modified independence;sit to/from stand Pt Will Perform Tub/Shower Transfer: Tub transfer;with supervision;with caregiver independent in assisting;shower seat;ambulating (with SPC) Pt/caregiver will Perform Home Exercise Program: Left upper extremity;With written HEP provided  OT Frequency: Min 2X/week   Barriers to D/C:    Pt's wife works nights - he has 3 daughters that live in the same apt complex that can help       Co-evaluation              AM-PAC PT "6 Clicks" Daily Activity     Outcome Measure Help from another person eating meals?: None Help from another person taking care of personal grooming?: A Little Help from another person toileting, which includes using toliet, bedpan, or urinal?: A Little Help from another person bathing (including washing,  rinsing, drying)?: None Help from another person to put on and taking off regular upper body clothing?: None Help from another person to put on and taking off regular lower body clothing?: None 6 Click Score: 22   End of Session Equipment Utilized During Treatment: Gait belt Nurse Communication: Mobility  status  Activity Tolerance: Patient tolerated treatment well Patient left: in chair;with call bell/phone within reach;with chair alarm set  OT Visit Diagnosis: Unsteadiness on feet (R26.81);Hemiplegia and hemiparesis Hemiplegia - Right/Left: Left Hemiplegia - dominant/non-dominant: Non-Dominant Hemiplegia - caused by: Cerebral infarction                Time: 6203-5597 OT Time Calculation (min): 30 min Charges:  OT General Charges $OT Visit: 1 Procedure OT Evaluation $OT Eval Moderate Complexity: 1 Procedure OT Treatments $Self Care/Home Management : 8-22 mins G-Codes:     Arhan Mcmanamon OTR/L [place] 12/23/2016, 3:08 PM

## 2016-12-23 NOTE — Evaluation (Signed)
Physical Therapy Evaluation Patient Details Name: James Zuniga MRN: [id number] DOB: 03-04-1956 Today's Date: 12/23/2016   History of Present Illness  Mr. Liller is a 61 y/o male with PMH of CHF, COPD (CPAP for sleep, 2L O2 PRN), hypertension, iron deficiency anemia, and left sciatica who presented to the ED on 12/22/16 with left sided sensory changes and weakness. MRI revealed Acute small RIGHT frontoparietal/MCA territory infarct.  Clinical Impression  Pt presented sitting OOB in recliner chair, awake and willing to participate in therapy session. Prior to admission, pt reported that he was mod I with use of SPC and independent with ADLs. Pt ambulated in hallway with min guard with use of SPC, demonstrating modest instability but no overt LOB. Pt would greatly benefit from OP PT services upon d/c. Pt would continue to benefit from skilled physical therapy services at this time while admitted and after d/c to address the below listed limitations in order to improve overall safety and independence with functional mobility.      Follow Up Recommendations Outpatient PT    Equipment Recommendations  None recommended by PT    Recommendations for Other Services       Precautions / Restrictions Precautions Precautions: Fall Restrictions Weight Bearing Restrictions: No      Mobility  Bed Mobility Overal bed mobility: Modified Independent             General bed mobility comments: pt OOB in recliner chair with pt arrived  Transfers Overall transfer level: Needs assistance Equipment used: None;Straight cane Transfers: Sit to/from Stand Sit to Stand: Min guard Stand pivot transfers: Min guard       General transfer comment: increased time, min guard for safety, pt mildly unsteady upon standing but no need for physical assistance  Ambulation/Gait Ambulation/Gait assistance: Min guard Ambulation Distance (Feet): 100 Feet Assistive device: Straight cane Gait Pattern/deviations:  Step-through pattern;Decreased stride length Gait velocity: decreased Gait velocity interpretation: Below normal speed for age/gender General Gait Details: lack of arm swing on L, modest instability but no overt LOB or need for physical assistance, close min guard for safety  Stairs            Wheelchair Mobility    Modified Rankin (Stroke Patients Only) Modified Rankin (Stroke Patients Only) Pre-Morbid Rankin Score: Slight disability Modified Rankin: Moderate disability     Balance Overall balance assessment: Needs assistance Sitting-balance support: No upper extremity supported;Feet supported Sitting balance-Leahy Scale: Good Sitting balance - Comments: sitting EOB for LB dressing task   Standing balance support: During functional activity;No upper extremity supported Standing balance-Leahy Scale: Fair Standing balance comment: for transfer                             Pertinent Vitals/Pain Pain Assessment: No/denies pain Faces Pain Scale: Hurts little more Pain Location: head when bending over Pain Descriptors / Indicators: Headache Pain Intervention(s): Monitored during session;Repositioned    Home Living Family/patient expects to be discharged to:: Private residence Living Arrangements: Spouse/significant other Available Help at Discharge: Family;Available PRN/intermittently (wife works at night, daughters live in development) Type of Home: Apartment Home Access: Level entry     Home Layout: One level [place]: Kasandra Knudsen - single point Additional Comments: moving apts on July 20th, will have 7 steps at that point    Prior Function Level of Independence: Independent with assistive device(s)         Comments: used SPC for mobility  Hand Dominance   Dominant Hand: Right    Extremity/Trunk Assessment   Upper Extremity Assessment Upper Extremity Assessment: Defer to OT evaluation LUE Deficits / Details: grossly 4/5 strength, able to  make full fist and full extension, increased time to perform tasks LUE Sensation: decreased light touch;decreased proprioception LUE Coordination: decreased fine motor;decreased gross motor    Lower Extremity Assessment Lower Extremity Assessment: LLE deficits/detail LLE Deficits / Details: pt with diminished sensation to light touch throughout L LE as compared to R. MMT revealed 3+/5 for hip flexion, hip abduction, hip adduction, knee flexion; 4/5 for knee extension and ankle DF. LLE Sensation: decreased light touch LLE Coordination: decreased fine motor;decreased gross motor    Cervical / Trunk Assessment Cervical / Trunk Assessment: Normal  Communication   Communication: No difficulties  Cognition Arousal/Alertness: Awake/alert Behavior During Therapy: WFL for tasks assessed/performed Overall Cognitive Status: Within Functional Limits for tasks assessed                                        General Comments General comments (skin integrity, edema, etc.): Pt concerned about cost of OPOT when discussing the plan going forward    Exercises     Assessment/Plan    PT Assessment Patient needs continued PT services  PT Problem List Decreased strength;Decreased activity tolerance;Decreased balance;Decreased mobility;Decreased coordination;Decreased knowledge of use of DME;Decreased safety awareness       PT Treatment Interventions DME instruction;Gait training;Stair training;Functional mobility training;Therapeutic activities;Therapeutic exercise;Balance training;Neuromuscular re-education;Patient/family education    PT Goals (Current goals can be found in the Care Plan section)  Acute Rehab PT Goals Patient Stated Goal: return to PLOF PT Goal Formulation: With patient Time For Goal Achievement: 01/06/17 Potential to Achieve Goals: Good    Frequency Min 4X/week   Barriers to discharge        Co-evaluation               AM-PAC PT "6 Clicks" Daily  Activity  Outcome Measure Difficulty turning over in bed (including adjusting bedclothes, sheets and blankets)?: A Little Difficulty moving from lying on back to sitting on the side of the bed? : A Little Difficulty sitting down on and standing up from a chair with arms (e.g., wheelchair, bedside commode, etc,.)?: A Little Help needed moving to and from a bed to chair (including a wheelchair)?: A Little Help needed walking in hospital room?: A Little Help needed climbing 3-5 steps with a railing? : A Little 6 Click Score: 18    End of Session Equipment Utilized During Treatment: Gait belt Activity Tolerance: Patient tolerated treatment well Patient left: in chair;with call bell/phone within reach;with chair alarm set Nurse Communication: Mobility status PT Visit Diagnosis: Other abnormalities of gait and mobility (R26.89);Other symptoms and signs involving the nervous system (R29.898)    Time: 7619-5093 PT Time Calculation (min) (ACUTE ONLY): 17 min   Charges:   PT Evaluation $PT Eval Moderate Complexity: 1 Procedure     PT G Codes:        Sherie Don, PT, DPT [place] 12/23/2016, 3:24 PM

## 2016-12-23 NOTE — Progress Notes (Signed)
    [facility] has been requested to perform a transesophageal echocardiogram on James Zuniga for stroke.  After careful review of history and examination, the risks and benefits of transesophageal echocardiogram have been explained including risks of esophageal damage, perforation (1:10,000 risk), bleeding, pharyngeal hematoma as well as other potential complications associated with conscious sedation including aspiration, arrhythmia, respiratory failure and death. Alternatives to treatment were discussed, questions were answered. Patient is willing to proceed.   Linus Mako, PA-C  12/23/2016 2:54 PM

## 2016-12-23 NOTE — Progress Notes (Signed)
RT set up CPAP and placed on patient. Patient tolerating well at this time. RT will monitor as needed.  

## 2016-12-23 NOTE — Progress Notes (Signed)
STROKE TEAM PROGRESS NOTE   HISTORY OF PRESENT ILLNESS (per record) James Zuniga is an 61 y.o. male with a history of CHF, CAD, COPD, diabetes mellitus, hypertension, and peripheral vascular disease who presented 12/22/2016 for acute onset LUE incoordination and weakness.   He woke up at about 0130 and noticed that he was having difficulty using his left hand to adjust his CPAP mask. He then went back to bed. When his wife arrived home later this morning (she is a CNA), she noted that he was not quite right and took him to his PCP, who also noted a deficit and had him transported emergently to the ED. Code Stroke was called by EMS en route when patient began having difficulty seeing and with his speech.  LKN was 0005 on 12/22/2016.   Patient was not administered IV t-PA due to arriving outside of the treatment window.  He was admitted to General Neurology for further evaluation and treatment.   SUBJECTIVE (INTERVAL HISTORY) His wife is at the bedside.  The patient is awake, alert, and oriented.   OBJECTIVE Temp:  [96.8 F (36 C)-98.8 F (37.1 C)] 98.8 F (37.1 C) (06/13 1649) Pulse Rate:  [62-107] 80 (06/13 1649) Cardiac Rhythm: Normal sinus rhythm (06/13 0700) Resp:  [16-20] 20 (06/13 1649) BP: (118-160)/(74-112) 118/74 (06/13 1649) SpO2:  [96 %-100 %] 96 % (06/13 1649) Weight:  [215 lb (97.5 kg)] 215 lb (97.5 kg) (06/12 2000)  CBC:  Recent Labs Lab 12/18/16 0804  12/22/16 1128 12/22/16 1133 12/23/16 0443  WBC 7.4  --  8.1  --  6.9  NEUTROABS 4.8  --  5.1  --   --   HGB 15.2  < > 14.7 16.0 14.3  HCT 46.5  < > 46.0 47.0 46.0  MCV 91  --  91.5  --  91.8  PLT 291  --  288  --  274  < > = values in this interval not displayed.  Basic Metabolic Panel:   Recent Labs Lab 12/22/16 1128 12/22/16 1133 12/23/16 0443  NA 138 140 139  K 3.7 3.8 3.1*  CL 104 103 100*  CO2 26  --  29  GLUCOSE 81 76 72  BUN 7 8 9   CREATININE 1.16 1.10 1.22  CALCIUM 9.5  --  9.3    Lipid Panel:  No results found for: CHOL, TRIG, HDL, CHOLHDL, VLDL, LDLCALC HgbA1c: No results found for: HGBA1C Urine Drug Screen:     Component Value Date/Time   LABOPIA NONE DETECTED 12/22/2016 1759   COCAINSCRNUR NONE DETECTED 12/22/2016 1759   LABBENZ NONE DETECTED 12/22/2016 1759   AMPHETMU NONE DETECTED 12/22/2016 1759   THCU NONE DETECTED 12/22/2016 1759   LABBARB NONE DETECTED 12/22/2016 1759    Alcohol Level No results found for: ETH  IMAGING  Ct Angio Head W Or Wo Contrast, Ct Angio Neck W Or Wo Contrast, Ct Cerebral Perfusion W Contrast 12/22/2016 IMPRESSION: 1. CT perfusion study degraded by motion artifact. No core infarct detected, and small areas of suggested penumbra in the opposite hemispheres are low most likely artifact. 2. Negative for emergent large vessel occlusion. 3. Negative for large vessel atherosclerosis or stenosis at the aortic arch or in the head and neck. 4. Questionable and most likely artifactual decreased enhancement of left MCA middle sylvian branches (series 16 image 29). I confirmed with Dr. Kerney Elbe that this is contralateral to the patient's symptoms. 5. Otherwise negative intracranial CTA.  Mr Brain Wo Contrast, Mr Jodene Nam  Head/brain Wo Cm 12/22/2016 IMPRESSION: MRI HEAD: Acute small RIGHT frontoparietal/MCA territory infarct. Moderate chronic small vessel ischemic disease. MRA HEAD: Moderately motion degraded examination without acute large vessel occlusion or severe stenosis.   Ct Head Code Stroke W/o Cm 12/22/2016 IMPRESSION: 1. No acute intracranial abnormality 2. ASPECTS is 10.    PHYSICAL EXAM   Physical exam: Exam: Gen: NAD, conversant, well nourised                     CV: RRR, no MRG. No Carotid Bruits. No peripheral edema, warm, nontender Eyes: Conjunctivae clear without exudates or hemorrhage  Neuro: Detailed Neurologic Exam  Speech:    Speech is normal; fluent and spontaneous with normal comprehension.  Cognition:    The patient is  oriented to person, place, and time;     recent and remote memory intact;     language fluent;     normal attention, concentration,     fund of knowledge Cranial Nerves:    The pupils are equal, round, and reactive to light.  Visual fields are full to finger confrontation. Extraocular movements are intact. Trigeminal sensation is intact and the muscles of mastication are normal. The face is symmetric. The palate elevates in the midline. Hearing intact. Voice is normal. Shoulder shrug is normal. The tongue has normal motion without fasciculations.   Coordination:    Left arm dysmetria > left leg  Motor Observation:    No asymmetry, no atrophy, and no involuntary movements noted. Tone:    Normal muscle tone.      Strength: Left arm and leg 4/5 strength otherwise strength is V/V in the upper and lower limbs.      Sensation: intact to LT     Reflex Exam:  ASSESSMENT/PLAN [name] is a 61 y.o. male with history of  CHF, CAD, diabetes mellitus, hypertension, and peripheral vascular disease presenting with LUE incoordination and weakness. He did not receive IV t-PA due to arriving outside of the treatment window.   Stroke:  Acute small right frontoparietal/MCA territory infarct likely embolic from unknown source  Resultant left arm>leg weakness and sensory loss  CT head: No stroke  MRI head: acute small cortical right frontoparietal/MCA territory infarct   Recommend TEE. If PFO found will check for DVTs. If no cause of emboli found on TEE please evaluate for loop.  MRA head: no acute large vessel occlusion or stenosis Carotid dopplers: See CTA neck (Negative for large vessel atherosclerosis or stenosis at the aortic arch or in the head and neck.)  2D Echo  pending  TEE pending  LDL pending  HgbA1c pending  Lovenox 40 mg sq daily for VTE prophylaxis Diet heart healthy/carb modified Room service appropriate? Yes; Fluid consistency: Thin Diet NPO time  specified  aspirin 81 mg daily prior to admission, now on aspirin 81 mg daily  Patient counseled to be compliant with his antithrombotic medications  Ongoing aggressive stroke risk factor management  Therapy recommendations: none  Disposition: pending  Hypertension  Stable Permissive hypertension (OK if < 220/120) but gradually normalize in 5-7 days Long-term BP goal normotensive  Hyperlipidemia  Home meds: atorvastatin 20 mg PO daily, increased to 80 mg PO daily  LDL pending, goal < 70  Continue statin at discharge  Diabetes  HgbA1c pending, goal < 7.0  Controlled  Other Stroke Risk Factors  Obesity, Body mass index is 34.7 kg/m., recommend weight loss, diet and exercise as appropriate   Family hx stroke (  brother)  Coronary artery disease  Obstructive sleep apnea, on CPAP at home  Other Active Problems  None  Hospital day # 1   Personally examined patient and images, and have participated in and made any corrections needed to history, physical, neuro exam,assessment and plan as stated above.  I have personally obtained the history, evaluated lab date, reviewed imaging studies and agree with radiology interpretations.    Sarina Ill, MD Stroke Neurology   To contact Stroke Continuity provider, please refer to [email or web address] After hours, contact General Neurology

## 2016-12-23 NOTE — Progress Notes (Signed)
SLP Cancellation Note  Patient Details Name: James Zuniga MRN: [id number] DOB: 1955-10-08   Cancelled treatment:       Reason Eval/Treat Not Completed: Patient at procedure or test/unavailable. Will continue efforts.  Celia B. [place], [facility], [place]  Shonna Chock 12/23/2016, 11:08 AM

## 2016-12-24 ENCOUNTER — Inpatient Hospital Stay (HOSPITAL_COMMUNITY): Payer: Self-pay

## 2016-12-24 ENCOUNTER — Encounter (HOSPITAL_COMMUNITY)
Admission: EM | Disposition: A | Discharge status: Home / Self Care | Payer: Self-pay | Source: Home / Self Care | Attending: Internal Medicine

## 2016-12-24 ENCOUNTER — Encounter (HOSPITAL_COMMUNITY): Payer: Self-pay | Admitting: *Deleted

## 2016-12-24 ENCOUNTER — Inpatient Hospital Stay (HOSPITAL_COMMUNITY): Payer: PPO

## 2016-12-24 DIAGNOSIS — I63319 Cerebral infarction due to thrombosis of unspecified middle cerebral artery: Secondary | ICD-10-CM

## 2016-12-24 DIAGNOSIS — I638 Other cerebral infarction: Secondary | ICD-10-CM

## 2016-12-24 DIAGNOSIS — I10 Essential (primary) hypertension: Secondary | ICD-10-CM

## 2016-12-24 HISTORY — PX: TEE WITHOUT CARDIOVERSION: SHX5443

## 2016-12-24 LAB — LIPID PANEL
CHOLESTEROL: 90 mg/dL (ref 0–200)
HDL: 25 mg/dL — AB (ref >40)
LDL Cholesterol: 37 mg/dL (ref 0–99)
TRIGLYCERIDES: 140 mg/dL (ref <150)
Total CHOL/HDL Ratio: 3.6 RATIO
VLDL: 28 mg/dL (ref 0–40)

## 2016-12-24 LAB — BASIC METABOLIC PANEL
ANION GAP: 9 (ref 5–15)
BUN: 9 mg/dL (ref 6–20)
CO2: 29 mmol/L (ref 22–32)
Calcium: 9.4 mg/dL (ref 8.9–10.3)
Chloride: 101 mmol/L (ref 101–111)
Creatinine, Ser: 1.13 mg/dL (ref 0.61–1.24)
Glucose, Bld: 96 mg/dL (ref 65–99)
POTASSIUM: 3.1 mmol/L — AB (ref 3.5–5.1)
SODIUM: 139 mmol/L (ref 135–145)

## 2016-12-24 LAB — GLUCOSE, CAPILLARY
GLUCOSE-CAPILLARY: 86 mg/dL (ref 65–99)
GLUCOSE-CAPILLARY: 74 mg/dL (ref 65–99)
GLUCOSE-CAPILLARY: 94 mg/dL (ref 65–99)
Glucose-Capillary: 109 mg/dL — ABNORMAL HIGH (ref 65–99)

## 2016-12-24 LAB — HEMOGLOBIN A1C
HEMOGLOBIN A1C: 6.6 % — AB (ref 4.8–5.6)
Mean Plasma Glucose: 143 mg/dL

## 2016-12-24 SURGERY — LOOP RECORDER INSERTION

## 2016-12-24 SURGERY — ECHOCARDIOGRAM, TRANSESOPHAGEAL
Anesthesia: Moderate Sedation

## 2016-12-24 MED ORDER — LOSARTAN POTASSIUM 50 MG PO TABS
100.0000 mg | ORAL_TABLET | Freq: Every day | ORAL | Status: DC
  Administered 2016-12-25: 100 mg via ORAL
  Filled 2016-12-24: qty 2

## 2016-12-24 MED ORDER — FENTANYL CITRATE (PF) 100 MCG/2ML IJ SOLN
INTRAMUSCULAR | Status: AC
  Filled 2016-12-24: qty 2

## 2016-12-24 MED ORDER — FENTANYL CITRATE (PF) 100 MCG/2ML IJ SOLN
INTRAMUSCULAR | Status: DC | PRN
Start: 2016-12-24 — End: 2016-12-24
  Administered 2016-12-24 (×2): 25 ug via INTRAVENOUS

## 2016-12-24 MED ORDER — LIDOCAINE VISCOUS 2 % MT SOLN
OROMUCOSAL | Status: AC
Start: 2016-12-24 — End: 2016-12-24
  Filled 2016-12-24: qty 15

## 2016-12-24 MED ORDER — LIDOCAINE VISCOUS 2 % MT SOLN
OROMUCOSAL | Status: DC | PRN
  Administered 2016-12-24: 1 via OROMUCOSAL

## 2016-12-24 MED ORDER — MIDAZOLAM HCL 5 MG/ML IJ SOLN
INTRAMUSCULAR | Status: AC
  Filled 2016-12-24: qty 2

## 2016-12-24 MED ORDER — ZOLPIDEM TARTRATE 5 MG PO TABS
5.0000 mg | ORAL_TABLET | Freq: Every evening | ORAL | Status: DC | PRN
  Administered 2016-12-24: 5 mg via ORAL
  Filled 2016-12-24: qty 1

## 2016-12-24 MED ORDER — POTASSIUM CHLORIDE CRYS ER 20 MEQ PO TBCR
40.0000 meq | EXTENDED_RELEASE_TABLET | Freq: Two times a day (BID) | ORAL | Status: AC
  Administered 2016-12-24: 40 meq via ORAL
  Filled 2016-12-24: qty 2

## 2016-12-24 MED ORDER — SODIUM CHLORIDE 0.9 % IV SOLN
INTRAVENOUS | Status: DC
  Administered 2016-12-24: 13:00:00 via INTRAVENOUS

## 2016-12-24 MED ORDER — METOPROLOL SUCCINATE ER 25 MG PO TB24
25.0000 mg | ORAL_TABLET | Freq: Every day | ORAL | Status: DC
  Administered 2016-12-24 – 2016-12-25 (×2): 25 mg via ORAL
  Filled 2016-12-24 (×2): qty 1

## 2016-12-24 MED ORDER — MIDAZOLAM HCL 10 MG/2ML IJ SOLN
INTRAMUSCULAR | Status: DC | PRN
  Administered 2016-12-24: 1 mg via INTRAVENOUS
  Administered 2016-12-24 (×2): 2 mg via INTRAVENOUS

## 2016-12-24 NOTE — Op Note (Signed)
LA, LAA without masses MV normal  Trace MR TV normal  No TR AV Normal  Trace AI PV normal   LVEF severely depressed  RVEF depressed With injection of agitated saline there were few bubbles late consistent with intrapulmonary shunting  Aorta grossly normal

## 2016-12-24 NOTE — Evaluation (Signed)
Speech Language Pathology Evaluation Patient Details Name: SAYGE SALVATO MRN: [id number] DOB: [birth year] Today's Date: 12/24/2016 Time: 6213-0865 SLP Time Calculation (min) (ACUTE ONLY): 23 min  Problem List:  Patient Active Problem List   Diagnosis Date Noted  . Left-sided weakness   . Chronic pain of left knee 12/22/2016  . Stroke ([place]) 12/22/2016  . Bradycardia 11/12/2016  . Generalized weakness 11/12/2016  . AKI (acute kidney injury) ([place]) 11/12/2016  . Hypophosphatemia 11/12/2016  . Leukocytosis 11/12/2016  . Diabetes mellitus with complication ([place]) 78/46/9629  . At risk for falling 09/05/2015  . Chronic respiratory failure ([place]) 07/22/2015  . COLD (chronic obstructive lung disease) ([place]) 11/12/2014  . Dyspnea and respiratory abnormality 11/12/2014  . Lung nodule 04/02/2014  . Smoking 04/02/2014  . Hypokalemia 12/15/2013  . Diabetes mellitus, type 2 ([place]) 09/04/2013  . Essential (primary) hypertension 09/04/2013  . Absolute anemia 09/01/2013  . Chronic pain 08/16/2013  . CAFL (chronic airflow limitation) ([place]) 08/16/2013  . Chronic obstructive pulmonary disease ([place]) 08/16/2013  . Adiposity 08/11/2013  . Peripheral blood vessel disorder ([place]) 07/03/2013  . Cannot sleep 07/03/2013  . Peripheral vascular disease ([place]) 07/03/2013  . Iron deficiency anemia 07/01/2013  . Hypotestosteronism 07/01/2013  . 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 Compulsive tobacco user syndrome 06/01/2013  . Current tobacco use 06/01/2013  . Pulmonary nodule, right 51mm Aug 2014 03/25/2013  . Liver lesion 03/25/2013  . Abnormal magnetic resonance imaging study 03/21/2013  . Disease of liver 03/15/2013  . 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 Cancer screening 10/18/2012  . COPD (chronic obstructive pulmonary disease) ([place]) 08/11/2012  . Adverse reaction to bacterial vaccine 06/17/2012  . 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 Body mass index 27.0-27.9, adult 01/04/2012   . Emphysema 11/23/2011  . Chronic gout 11/23/2011  . Congestive heart failure ([place]) 11/23/2011  . Hypertension 11/23/2011  . Hyperlipidemia 11/23/2011  . Chronic bronchitis 11/23/2011  . Type 2 diabetes mellitus ([place]) 11/23/2011  . Chronic back pain 11/23/2011  . Osteoarthritis of left knee 11/23/2011  . Avitaminosis D 11/05/2011  . Gout 11/05/2011  . Cardiomyopathy, idiopathic ([place]) 11/04/2011  . Nondependent alcohol abuse 11/04/2011  . Amnesia 11/04/2011  . Acid reflux 11/04/2011  . Arthropathia 11/04/2011  . Primary cardiomyopathy ([place]) 11/04/2011   Past Medical History:  Past Medical History:  Diagnosis Date  . CHF (congestive heart failure) ([place])   . Chronic airway obstruction ([place])   . Chronic pain   . Coronary artery disease   . Diabetes mellitus   . Disorder of liver   . Esophageal reflux   . Gout   . Hyperlipidemia   . Hypertension   . Hypokalemia   . Hypokalemia 11/2016  . Hypotestosteronism 07/01/2013  . Insomnia   . Iron deficiency anemia, unspecified 07/01/2013  . Memory loss   . PVD (peripheral vascular disease) ([place])   . Renal disorder   . Sleep apnea   . Vitamin D deficiency    Past Surgical History:  Past Surgical History:  Procedure Laterality Date  . KNEE ARTHROSCOPY    . NO PAST SURGERIES     HPI:  61 year old male admitted 12/22/16 with left weakness. PMH significant for NICM, CHF, OSA, COPD, DM2, HTN, HLD, GERD,back pain, OA. MRI reveals right fronto-parietal MCA CVA.   Assessment / Plan / Recommendation Clinical Impression  Pt has mild impairments with sustained attention, basic calculations, and memory retrieval, although with  more moderate difficulty observed during storage of new information.  Pt reports that this is a significant change from his baseline level of function. He would benefit from OP SLP f/u and intermittent supervision upon d/c.    SLP Assessment  SLP Recommendation/Assessment: Patient needs continued Speech Lanaguage  Pathology Services SLP Visit Diagnosis: Cognitive communication deficit (R41.841)    Follow Up Recommendations  Outpatient SLP;Other (comment) (intermittent supervision)    Frequency and Duration min 2x/week  2 weeks      SLP Evaluation Cognition  Overall Cognitive Status: Impaired/Different from baseline Arousal/Alertness: Awake/alert Orientation Level: Oriented X4 Attention: Sustained Sustained Attention: Impaired Sustained Attention Impairment: Verbal basic Memory: Impaired Memory Impairment: Storage deficit;Retrieval deficit;Decreased recall of new information Awareness: Appears intact Problem Solving: Impaired Problem Solving Impairment: Verbal complex Safety/Judgment: Appears intact       Comprehension  Auditory Comprehension Overall Auditory Comprehension: Appears within functional limits for tasks assessed    Expression Expression Primary Mode of Expression: Verbal Verbal Expression Overall Verbal Expression: Appears within functional limits for tasks assessed   Oral / Motor  Motor Speech Overall Motor Speech: Appears within functional limits for tasks assessed   GO                    Germain Osgood 12/24/2016, 11:27 AM  Germain Osgood, M.A. CCC-SLP [phone]

## 2016-12-24 NOTE — H&P (View-Only) (Signed)
ELECTROPHYSIOLOGY CONSULT NOTE  Patient ID: James Zuniga MRN: [id number], DOB/AGE: Jul 15, 1955   Admit date: 12/22/2016 Date of Consult: 12/24/2016  Primary Physician: Berkley Harvey, NP Primary Cardiologist: Dr. Otho Perl, [facility] Reason for Consultation: Cryptogenic stroke ; recommendations regarding Implantable Loop Recorder by Dr. Jaynee Eagles  History of Present Illness James Zuniga was admitted on 12/22/2016 with acute CVA.  They first developed symptoms of L hand weakness upon waking and progressed to difficult speech enroute to hospital.   PMHx includes CHF, DCM, CAD (non obstructive disease by cath 2013 by notes) , COPD, diabetes mellitus, hypertension, OSA, and peripheral vascular disease.   Imaging demonstrated Acute small right frontoparietal/MCA territory infarct likely embolic from unknown source.  he has undergone workup for stroke including echocardiogram and carotid dopplers.  The patient has been monitored on telemetry which has demonstrated sinus rhythm with no arrhythmias.  Inpatient stroke work-up is to be completed with a TEE.   Echocardiogram  11/15/16 Study Conclusions - Left ventricle: The cavity size was normal. Wall thickness was   normal. Systolic function was moderately reduced. The estimated   ejection fraction was in the range of 35% to 40%. Severe inferior   and inferoseptal hypokinesis. Doppler parameters are consistent   with abnormal left ventricular relaxation (grade 1 diastolic   dysfunction). LV filling pressures are elevated. - Left atrium: The atrium was normal in size. - Inferior vena cava: The vessel was normal in size. The   respirophasic diameter changes were in the normal range (= 50%),   consistent with normal central venous pressure. Impressions: - LVEF 35-40%, normal wall thickness, inferior and inferoseptal   severe hypokinesis, grade 1 DD with elevated LV filling pressure,   normal LA size, normal IVC Lab work is reviewed.   Prior to admission,  the patient denies, shortness of breath, dizziness, palpitations, or syncope.  He has been seeing his out patient cardiologist for ongoing episodes of CP, but not any palpitations. They are recovering from their stroke with plans to home at discharge per the patient.  EP has been asked to evaluate for placement of an implantable loop recorder to monitor for atrial fibrillation.     Past Medical History:  Diagnosis Date  . CHF (congestive heart failure) ([place])   . Chronic airway obstruction ([place])   . Chronic pain   . Coronary artery disease   . Diabetes mellitus   . Disorder of liver   . Esophageal reflux   . Gout   . Hyperlipidemia   . Hypertension   . Hypokalemia   . Hypokalemia 11/2016  . Hypotestosteronism 07/01/2013  . Insomnia   . Iron deficiency anemia, unspecified 07/01/2013  . Memory loss   . PVD (peripheral vascular disease) ([place])   . Renal disorder   . Sleep apnea   . Vitamin D deficiency      Surgical History:  Past Surgical History:  Procedure Laterality Date  . KNEE ARTHROSCOPY    . NO PAST SURGERIES       Prescriptions Prior to Admission  Medication Sig Dispense Refill Last Dose  . albuterol (PROVENTIL HFA;VENTOLIN HFA) 108 (90 Base) MCG/ACT inhaler Inhale 1-2 puffs into the lungs every 6 (six) hours as needed for wheezing. 1 Inhaler 0 Past Month at Unknown time  . albuterol (PROVENTIL) (2.5 MG/3ML) 0.083% nebulizer solution Take 2.5 mg by nebulization every 6 (six) hours as needed for wheezing or shortness of breath.   Past Week at Unknown time  .  allopurinol (ZYLOPRIM) 300 MG tablet Take 300 mg by mouth daily.   12/21/2016 at Unknown time  . aspirin 81 MG EC tablet Take 81 mg by mouth daily. Swallow whole.   12/21/2016 at Unknown time  . atorvastatin (LIPITOR) 20 MG tablet Take 20 mg by mouth every morning.    12/21/2016 at Unknown time  . BREO ELLIPTA 100-25 MCG/INH AEPB INL 1 PUFF PO D (Patient taking differently: Inhale 1 puff into the lungs daily. ) 1 each  0 Past Week at Unknown time  . Dulaglutide 0.75 MG/0.5ML SOPN Inject 0.75 mg into the skin every 7 (seven) days. Tuesday of each week   12/21/2016 at Unknown time  . DULoxetine (CYMBALTA) 60 MG capsule Take 60 mg by mouth daily.   12/21/2016 at Unknown time  . furosemide (LASIX) 20 MG tablet Take 20 mg by mouth 2 (two) times daily.    12/21/2016 at Unknown time  . gabapentin (NEURONTIN) 300 MG capsule Take 300 mg by mouth 3 (three) times daily.    12/21/2016 at Unknown time  . losartan (COZAAR) 100 MG tablet Take 100 mg by mouth daily.    12/21/2016 at Unknown time  . omeprazole (PRILOSEC) 20 MG capsule Take 20 mg by mouth daily.   0 12/21/2016 at Unknown time  . potassium chloride SA (K-DUR,KLOR-CON) 20 MEQ tablet Take 1 tablet (20 mEq total) by mouth 2 (two) times daily. 30 tablet 3 12/22/2016 at Unknown time  . primidone (MYSOLINE) 50 MG tablet Take 50 mg by mouth daily.    12/21/2016 at Unknown time  . tamsulosin (FLOMAX) 0.4 MG CAPS capsule Take 1 capsule (0.4 mg total) by mouth daily after breakfast. 30 capsule 12 12/22/2016 at Unknown time  . testosterone cypionate (DEPOTESTOSTERONE CYPIONATE) 200 MG/ML injection Inject 200 mg into the muscle every 14 (fourteen) days.    12/08/2016 at Unknown time  . tiZANidine (ZANAFLEX) 4 MG tablet 4 mg 3 (three) times daily as needed for muscle spasms.   0 Past Week at Unknown time  . traMADol (ULTRAM) 50 MG tablet Take 50 mg by mouth daily as needed for moderate pain.    12/21/2016 at Unknown time  . umeclidinium bromide (INCRUSE ELLIPTA) 62.5 MCG/INH AEPB Inhale 1 puff into the lungs daily. 1 each 0 12/21/2016 at Unknown time  . zolpidem (AMBIEN) 10 MG tablet Take 10 mg by mouth at bedtime.    12/21/2016 at Unknown time  . Fluticasone-Umeclidin-Vilant 100-62.5-25 MCG/INH AEPB Inhale 1 puff into the lungs daily.   Taking  . OXYGEN Inhale 2 L into the lungs as needed.   cont  . tiotropium (SPIRIVA) 18 MCG inhalation capsule Place 1 capsule (18 mcg total) into inhaler  and inhale daily. (Patient not taking: Reported on 12/22/2016) 30 capsule 6 Not Taking at Unknown time    Inpatient Medications:  .  stroke: mapping our early stages of recovery book   Does not apply Once  . allopurinol  300 mg Oral Daily  . aspirin EC  81 mg Oral Daily  . atorvastatin  80 mg Oral q1800  . clopidogrel  75 mg Oral Daily  . DULoxetine  60 mg Oral Daily  . enoxaparin (LOVENOX) injection  40 mg Subcutaneous Q24H  . fluticasone furoate-vilanterol  1 puff Inhalation Daily  . gabapentin  300 mg Oral TID  . insulin aspart  0-15 Units Subcutaneous TID WC  . insulin aspart  0-5 Units Subcutaneous QHS  . pantoprazole  40 mg Oral Daily  .  potassium chloride  40 mEq Oral BID  . primidone  50 mg Oral Daily  . tamsulosin  0.4 mg Oral QPC breakfast  . umeclidinium bromide  1 puff Inhalation Daily    Allergies: No Known Allergies  Social History   Social History  . Marital status: Married    Spouse name: N/A  . Number of children: N/A  . Years of education: N/A   Occupational History  . Not on file.   Social History Main Topics  . Smoking status: Former Smoker    Packs/day: 1.00    Years: 30.00    Types: Cigarettes    Start date: 08/05/1983    Quit date: 10/11/2016  . Smokeless tobacco: Never Used     Comment: quit smoking 4 months ago  . Alcohol use No  . Drug use: No  . Sexual activity: Not on file   Other Topics Concern  . Not on file   Social History Narrative  . No narrative on file     Family History  Problem Relation Age of Onset  . Heart disease Mother   . Diabetes Father   . Stroke Brother       Review of Systems: All other systems reviewed and are otherwise negative except as noted above.  Physical Exam: Vitals:   12/23/16 1649 12/23/16 2100 12/24/16 0500 12/24/16 0934  BP: 118/74 (!) 154/106 131/83   Pulse: 80 93 72   Resp: 20 20 20    Temp: 98.8 F (37.1 C) 98.8 F (37.1 C) 98.6 F (37 C)   TempSrc: Oral Oral Oral   SpO2: 96% 94% 97%  98%  Weight:      Height:        GEN- The patient is well appearing, alert and oriented x 3 today.   Head- normocephalic, atraumatic Eyes-  Sclera clear, conjunctiva pink Ears- hearing intact Oropharynx- clear Neck- supple Lungs- CTA b/l,  normal work of breathing Heart- RRR, no murmurs, rubs or gallops  GI- soft, NT, ND Extremities- no clubbing, cyanosis, or edema MS- no significant deformity or atrophy Skin- no rash or lesion Psych- euthymic mood, full affect   Labs:   Lab Results  Component Value Date   WBC 6.9 12/23/2016   HGB 14.3 12/23/2016   HCT 46.0 12/23/2016   MCV 91.8 12/23/2016   PLT 274 12/23/2016    Recent Labs Lab 12/23/16 0443 12/24/16 0411  NA 139 139  K 3.1* 3.1*  CL 100* 101  CO2 29 29  BUN 9 9  CREATININE 1.22 1.13  CALCIUM 9.3 9.4  PROT 6.1*  --   BILITOT 0.5  --   ALKPHOS 75  --   ALT 17  --   AST 18  --   GLUCOSE 72 96   Lab Results  Component Value Date   TROPONINI 0.03 (HH) 11/13/2016   Lab Results  Component Value Date   CHOL 90 12/24/2016   Lab Results  Component Value Date   HDL 25 (L) 12/24/2016   Lab Results  Component Value Date   LDLCALC 37 12/24/2016   Lab Results  Component Value Date   TRIG 140 12/24/2016   Lab Results  Component Value Date   CHOLHDL 3.6 12/24/2016   No results found for: LDLDIRECT  Lab Results  Component Value Date   DDIMER 0.47 11/12/2014     Radiology/Studies:  Ct Angio Head W Or Wo Contrast Result Date: 12/22/2016 CLINICAL DATA:  61 year old male code stroke presentation with  left side weakness, possible visual changes. EXAM: CT ANGIOGRAPHY HEAD AND NECK CT PERFUSION BRAIN TECHNIQUE: Multidetector CT imaging of the head and neck was performed using the standard protocol during bolus administration of intravenous contrast. Multiplanar CT image reconstructions and MIPs were obtained to evaluate the vascular anatomy. Carotid stenosis measurements (when applicable) are obtained  utilizing NASCET criteria, using the distal internal carotid diameter as the denominator. Multiphase CT imaging of the brain was performed following IV bolus contrast injection. Subsequent parametric perfusion maps were calculated using RAPID software. CONTRAST:  140 mL Isovue 370 The initial CT perfusion contrast bolus was adequate, but the patient right antecubital fossa IV ruptured an infiltrated in during the initial cta head and neck attempt. I was called to see the patient at 1200 hours. Associated soft tissue swelling in the right AC fossa, but the patient reported little discomfort. A new IV had been started at the left antecubital fossa. Appropriate post infiltration care orders for the right upper extremity are being placed. COMPARISON:  Head CT without contrast today 1137 hours. Cervical spine CT 09/16/2016. FINDINGS: CT Brain Perfusion Findings: Motion artifact occurred during this CTA source acquisition resulting in automatic software rejection of some imaging data from the early arterial phase. CBF (<30%) Volume: 0 mL Perfusion (Tmax>6.0s) volume: 17 mL of probably artifactual penumbra designated in the anterior left inferior frontal gyrus and at the same time the right occipital lobe. Mismatch Volume: Not applicable Infarction Location:  Not applicable CTA NECK Skeleton: Much of the dentition is absent. Cervical spine degeneration appears stable. No acute osseous abnormality identified. Upper chest: Superior mediastinal lipomatosis. No superior mediastinal lymphadenopathy. Upper lungs remarkable for atelectasis and some paraseptal emphysema or scarring. Other neck: Negative.  No cervical lymphadenopathy. Aortic arch: 3 vessel arch configuration with no arch atherosclerosis or stenosis. Right carotid system: Tortuous brachiocephalic artery and right CCA origin both with a mildly kinked appearance. No right cervical carotid atherosclerosis or stenosis. Normal right carotid bifurcation. Left carotid  system: Mild left CCA tortuosity. No cervical left carotid atherosclerosis or stenosis. Normal left carotid bifurcation. Vertebral arteries: No proximal right subclavian artery atherosclerosis or stenosis. Normal right vertebral artery origin. Normal right vertebral artery to the skullbase. No proximal left subclavian artery atherosclerosis or stenosis. Mild tortuosity. Normal left vertebral artery origin. Codominant vertebral arteries. Normal left vertebral artery to the skullbase. CTA HEAD Posterior circulation: Normal codominant distal vertebral arteries. Normal PICA origins. Vertebrobasilar junction is normal aside from mild fenestration (normal variant). Patent basilar artery without stenosis. Normal SCA and PCA origins. Posterior communicating arteries are diminutive or absent. Bilateral PCA branches are normal. Anterior circulation: Patent ICA siphons with minimal calcified plaque and no siphon stenosis. Patent carotid termini. Dominant left ACA A1 segment. Anterior communicating artery and bilateral ACA branches are within normal limits. Unusual or early left MCA branching from the left ICA terminus posteriorly (series 11, image 89). There is some superimposed venous contamination. Left MCA M1 segment and left MCA bifurcation are patent. There is questionable decreased enhancement of some middle left MCA M2 and M3 branches, best seen on series 16, image 29. No complete branch occlusion, and the other left MCA branches appear normal. Right MCA M1 segment, bifurcation, and right MCA branches appear normal. Venous sinuses: Patent. Anatomic variants: Dominant left ACA A1 segment. Mildly fenestrated vertebrobasilar junction. Review of the MIP images confirms the above findings IMPRESSION: 1. CT perfusion study degraded by motion artifact. No core infarct detected, and small areas of suggested penumbra in the  opposite hemispheres are low most likely artifact. 2. Negative for emergent large vessel occlusion. 3. #1  and #2 were discussed preliminarily with Dr. Kerney Elbe at 1206 hours. 4. Negative for large vessel atherosclerosis or stenosis at the aortic arch or in the head and neck. 5. Questionable and most likely artifactual decreased enhancement of left MCA middle sylvian branches (series 16 image 29). I confirmed with Dr. Kerney Elbe that this is contralateral to the patient's symptoms. 6. Otherwise negative intracranial CTA. 7. IV infiltration during the initial CTA attempt. Physical exam of the patient revealed swelling about the right AC fossa infiltration site, but no other complicating features. Appropriate post infiltration orders Jing Howatt be placed in the EMR. The above also discussed by telephone with Dr. Kerney Elbe on 12/22/2016 at 1237 hours. Electronically Signed   By: Genevie Ann M.D.   On: 12/22/2016 12:43    Mr Brain Wo Contrast Result Date: 12/22/2016 CLINICAL DATA:  LEFT-sided weakness and possible vision changes. History of hypertension, hyperlipidemia, diabetes, memory loss. EXAM: MRI HEAD WITHOUT CONTRAST MRA HEAD WITHOUT CONTRAST TECHNIQUE: Multiplanar, multiecho pulse sequences of the brain and surrounding structures were obtained without intravenous contrast. Angiographic images of the head were obtained using MRA technique without contrast. COMPARISON:  CT HEAD December 22, 2016 at 1137 hours FINDINGS: MRI HEAD FINDINGS- mildly motion degraded examination. BRAIN: 14 x 7 mm reduced diffusion within the RIGHT frontoparietal lobes, pre and postcentral gyri with low ADC values. No susceptibility artifact to suggest hemorrhage. The ventricles and sulci are normal for patient's age. Patchy to confluent supratentorial white matter FLAIR T2 hyperintensities. Prominent basal ganglia perivascular spaces associated with chronic small vessel ischemic disease. No suspicious parenchymal signal, masses or mass effect. No abnormal extra-axial fluid collections. VASCULAR: Normal major intracranial vascular flow voids  present at skull base. SKULL AND UPPER CERVICAL SPINE: No abnormal sellar expansion. No suspicious calvarial bone marrow signal. Craniocervical junction maintained. SINUSES/ORBITS: The mastoid air-cells and included paranasal sinuses are well-aerated. The included ocular globes and orbital contents are non-suspicious. OTHER: Patient is edentulous. MRA HEAD FINDINGS- moderately motion degraded examination ANTERIOR CIRCULATION: Normal flow related enhancement of the included cervical, petrous, cavernous and supraclinoid internal carotid arteries. Patent anterior communicating artery. Normal flow related enhancement of the anterior and middle cerebral arteries, including distal segments. No large vessel occlusion, high-grade stenosis, or definite aneurysm. POSTERIOR CIRCULATION: LEFT vertebral artery is dominant. Basilar artery is patent, with normal flow related enhancement of the main branch vessels. Normal flow related enhancement of the posterior cerebral arteries. No large vessel occlusion, high-grade stenosis, or definite aneurysm. ANATOMIC VARIANTS: None. Source images and MIP images were reviewed. IMPRESSION: MRI HEAD: Acute small RIGHT frontoparietal/MCA territory infarct. Moderate chronic small vessel ischemic disease. MRA HEAD: Moderately motion degraded examination without acute large vessel occlusion or severe stenosis. Electronically Signed   By: Elon Alas M.D.   On: 12/22/2016 22:49    Ct Head Code Stroke W/o Cm Result Date: 12/22/2016 CLINICAL DATA:  Code stroke.  Code stroke, left-sided weakness EXAM: CT HEAD WITHOUT CONTRAST TECHNIQUE: Contiguous axial images were obtained from the base of the skull through the vertex without intravenous contrast. COMPARISON:  CT head 09/16/2016 FINDINGS: Brain: Negative for acute infarct, hemorrhage, mass. Ventricle size normal. Patchy hypodensity in the cerebral white matter bilaterally appears chronic. Vascular: Negative for hyperdense vessel. Skull:  Negative Sinuses/Orbits: Mild mucosal edema in the frontal sinuses. Remaining sinuses clear. Normal orbit. Other: None ASPECTS ([place] Stroke Program Early CT Score) - Ganglionic level  infarction (caudate, lentiform nuclei, internal capsule, insula, M1-M3 cortex): 7 - Supraganglionic infarction (M4-M6 cortex): 3 Total score (0-10 with 10 being normal): 10 IMPRESSION: 1. No acute intracranial abnormality 2. ASPECTS is 10 Electronically Signed   By: Franchot Gallo M.D.   On: 12/22/2016 11:45    12-lead ECG are SR All prior EKG's in EPIC reviewed with no documented atrial fibrillation  Telemetry SR only noted  Assessment and Plan:  1. Cryptogenic stroke The patient presents with cryptogenic stroke.  The patient has a TEE planned for this afternoon.  I spoke at length with the patient and his family about monitoring for afib with either a 30 day event monitor or an implantable loop recorder.  Risks, benefits, and alteratives to implantable loop recorder were discussed with the patient today.   He has a primary cardiologist and discussed f/u with him regarding monitoring as well.  He has been having CP it appears for some tme and out patient w/u for this is in progress.  At this time, the patient is very clear in their decision to proceed with implantable loop recorder at this time, rather then out patient via his cardiologist.   Wound care was reviewed with the patient (keep incision clean and dry for 3 days).  Wound check Kingslee Mairena be scheduled for the patient.  Please call with questions.   Baldwin [place], PA-C 12/24/2016  I have seen and examined this patient with Tommye Standard.  Agree with above, note added to reflect my findings.  On exam, RRR, no murmurs, lungs clear. Presented with left and weakness and difficulty with speech found to have a R MCA stroke. Has since recovered. No cause has been found. TEE planned for later today. Has had a TTE in the past showing an EF of 35-40%. Sufyaan Palma plan for LINQ  implant. Risks and benefits discussed. Risks include but not limited to bleeding and infection. He understands the risks and has agreed to the procedure. Should his EF be lower than 35% on TEE, he would likely benefit from 30 day monitoring and repeat TTE as he would potentially have an ICD implanted.    Jadira Nierman M. Nilesh Stegall MD 12/24/2016 12:24 PM

## 2016-12-24 NOTE — Progress Notes (Signed)
Internal Medicine Attending:   I saw and examined the patient. I reviewed the resident's note and I agree with the resident's findings and plan as documented in the resident's note. No current complaints, plan for TEE today then likely LINQ placement.

## 2016-12-24 NOTE — Interval H&P Note (Signed)
History and Physical Interval Note:  12/24/2016 2:12 PM  James Zuniga  has presented today for surgery, with the diagnosis of CVA  The various methods of treatment have been discussed with the patient and family. After consideration of risks, benefits and other options for treatment, the patient has consented to  Procedure(s): TRANSESOPHAGEAL ECHOCARDIOGRAM (TEE) (N/A) as a surgical intervention .  The patient's history has been reviewed, patient examined, no change in status, stable for surgery.  I have reviewed the patient's chart and labs.  Questions were answered to the patient's satisfaction.     Dorris Carnes

## 2016-12-24 NOTE — Care Management Note (Signed)
Case Management Note  Patient Details  Name: James Zuniga MRN: [id number] Date of Birth: August 26, 1955  Subjective/Objective:    Pt admitted with CVA. He is from home with spouse.                Action/Plan: PT/OT recommending outpatient therapy. CM following.  Expected Discharge Date:  12/25/16               Expected Discharge Plan:  OP Rehab  In-House Referral:     Discharge planning Services  CM Consult  Post Acute Care Choice:    Choice offered to:     DME Arranged:    DME Agency:     HH Arranged:    HH Agency:     Status of Service:  In process, will continue to follow  If discussed at Long Length of Stay Meetings, dates discussed:    Additional Comments:  Pollie Friar, RN 12/24/2016, 11:21 AM

## 2016-12-24 NOTE — Consult Note (Signed)
ELECTROPHYSIOLOGY CONSULT NOTE  Patient ID: James Zuniga MRN: [id number], DOB/AGE: 11-Feb-1956   Admit date: 12/22/2016 Date of Consult: 12/24/2016  Primary Physician: Berkley Harvey, NP Primary Cardiologist: Dr. Otho Perl, [facility] Reason for Consultation: Cryptogenic stroke ; recommendations regarding Implantable Loop Recorder by Dr. Jaynee Eagles  History of Present Illness James Zuniga was admitted on 12/22/2016 with acute CVA.  They first developed symptoms of L hand weakness upon waking and progressed to difficult speech enroute to hospital.   PMHx includes CHF, DCM, CAD (non obstructive disease by cath 2013 by notes) , COPD, diabetes mellitus, hypertension, OSA, and peripheral vascular disease.   Imaging demonstrated Acute small right frontoparietal/MCA territory infarct likely embolic from unknown source.  he has undergone workup for stroke including echocardiogram and carotid dopplers.  The patient has been monitored on telemetry which has demonstrated sinus rhythm with no arrhythmias.  Inpatient stroke work-up is to be completed with a TEE.   Echocardiogram  11/15/16 Study Conclusions - Left ventricle: The cavity size was normal. Wall thickness was   normal. Systolic function was moderately reduced. The estimated   ejection fraction was in the range of 35% to 40%. Severe inferior   and inferoseptal hypokinesis. Doppler parameters are consistent   with abnormal left ventricular relaxation (grade 1 diastolic   dysfunction). LV filling pressures are elevated. - Left atrium: The atrium was normal in size. - Inferior vena cava: The vessel was normal in size. The   respirophasic diameter changes were in the normal range (= 50%),   consistent with normal central venous pressure. Impressions: - LVEF 35-40%, normal wall thickness, inferior and inferoseptal   severe hypokinesis, grade 1 DD with elevated LV filling pressure,   normal LA size, normal IVC Lab work is reviewed.   Prior to admission,  the patient denies, shortness of breath, dizziness, palpitations, or syncope.  He has been seeing his out patient cardiologist for ongoing episodes of CP, but not any palpitations. They are recovering from their stroke with plans to home at discharge per the patient.  EP has been asked to evaluate for placement of an implantable loop recorder to monitor for atrial fibrillation.     Past Medical History:  Diagnosis Date  . CHF (congestive heart failure) ([place])   . Chronic airway obstruction ([place])   . Chronic pain   . Coronary artery disease   . Diabetes mellitus   . Disorder of liver   . Esophageal reflux   . Gout   . Hyperlipidemia   . Hypertension   . Hypokalemia   . Hypokalemia 11/2016  . Hypotestosteronism 07/01/2013  . Insomnia   . Iron deficiency anemia, unspecified 07/01/2013  . Memory loss   . PVD (peripheral vascular disease) ([place])   . Renal disorder   . Sleep apnea   . Vitamin D deficiency      Surgical History:  Past Surgical History:  Procedure Laterality Date  . KNEE ARTHROSCOPY    . NO PAST SURGERIES       Prescriptions Prior to Admission  Medication Sig Dispense Refill Last Dose  . albuterol (PROVENTIL HFA;VENTOLIN HFA) 108 (90 Base) MCG/ACT inhaler Inhale 1-2 puffs into the lungs every 6 (six) hours as needed for wheezing. 1 Inhaler 0 Past Month at Unknown time  . albuterol (PROVENTIL) (2.5 MG/3ML) 0.083% nebulizer solution Take 2.5 mg by nebulization every 6 (six) hours as needed for wheezing or shortness of breath.   Past Week at Unknown time  .  allopurinol (ZYLOPRIM) 300 MG tablet Take 300 mg by mouth daily.   12/21/2016 at Unknown time  . aspirin 81 MG EC tablet Take 81 mg by mouth daily. Swallow whole.   12/21/2016 at Unknown time  . atorvastatin (LIPITOR) 20 MG tablet Take 20 mg by mouth every morning.    12/21/2016 at Unknown time  . BREO ELLIPTA 100-25 MCG/INH AEPB INL 1 PUFF PO D (Patient taking differently: Inhale 1 puff into the lungs daily. ) 1 each  0 Past Week at Unknown time  . Dulaglutide 0.75 MG/0.5ML SOPN Inject 0.75 mg into the skin every 7 (seven) days. Tuesday of each week   12/21/2016 at Unknown time  . DULoxetine (CYMBALTA) 60 MG capsule Take 60 mg by mouth daily.   12/21/2016 at Unknown time  . furosemide (LASIX) 20 MG tablet Take 20 mg by mouth 2 (two) times daily.    12/21/2016 at Unknown time  . gabapentin (NEURONTIN) 300 MG capsule Take 300 mg by mouth 3 (three) times daily.    12/21/2016 at Unknown time  . losartan (COZAAR) 100 MG tablet Take 100 mg by mouth daily.    12/21/2016 at Unknown time  . omeprazole (PRILOSEC) 20 MG capsule Take 20 mg by mouth daily.   0 12/21/2016 at Unknown time  . potassium chloride SA (K-DUR,KLOR-CON) 20 MEQ tablet Take 1 tablet (20 mEq total) by mouth 2 (two) times daily. 30 tablet 3 12/22/2016 at Unknown time  . primidone (MYSOLINE) 50 MG tablet Take 50 mg by mouth daily.    12/21/2016 at Unknown time  . tamsulosin (FLOMAX) 0.4 MG CAPS capsule Take 1 capsule (0.4 mg total) by mouth daily after breakfast. 30 capsule 12 12/22/2016 at Unknown time  . testosterone cypionate (DEPOTESTOSTERONE CYPIONATE) 200 MG/ML injection Inject 200 mg into the muscle every 14 (fourteen) days.    12/08/2016 at Unknown time  . tiZANidine (ZANAFLEX) 4 MG tablet 4 mg 3 (three) times daily as needed for muscle spasms.   0 Past Week at Unknown time  . traMADol (ULTRAM) 50 MG tablet Take 50 mg by mouth daily as needed for moderate pain.    12/21/2016 at Unknown time  . umeclidinium bromide (INCRUSE ELLIPTA) 62.5 MCG/INH AEPB Inhale 1 puff into the lungs daily. 1 each 0 12/21/2016 at Unknown time  . zolpidem (AMBIEN) 10 MG tablet Take 10 mg by mouth at bedtime.    12/21/2016 at Unknown time  . Fluticasone-Umeclidin-Vilant 100-62.5-25 MCG/INH AEPB Inhale 1 puff into the lungs daily.   Taking  . OXYGEN Inhale 2 L into the lungs as needed.   cont  . tiotropium (SPIRIVA) 18 MCG inhalation capsule Place 1 capsule (18 mcg total) into inhaler  and inhale daily. (Patient not taking: Reported on 12/22/2016) 30 capsule 6 Not Taking at Unknown time    Inpatient Medications:  .  stroke: mapping our early stages of recovery book   Does not apply Once  . allopurinol  300 mg Oral Daily  . aspirin EC  81 mg Oral Daily  . atorvastatin  80 mg Oral q1800  . clopidogrel  75 mg Oral Daily  . DULoxetine  60 mg Oral Daily  . enoxaparin (LOVENOX) injection  40 mg Subcutaneous Q24H  . fluticasone furoate-vilanterol  1 puff Inhalation Daily  . gabapentin  300 mg Oral TID  . insulin aspart  0-15 Units Subcutaneous TID WC  . insulin aspart  0-5 Units Subcutaneous QHS  . pantoprazole  40 mg Oral Daily  .  potassium chloride  40 mEq Oral BID  . primidone  50 mg Oral Daily  . tamsulosin  0.4 mg Oral QPC breakfast  . umeclidinium bromide  1 puff Inhalation Daily    Allergies: No Known Allergies  Social History   Social History  . Marital status: Married    Spouse name: N/A  . Number of children: N/A  . Years of education: N/A   Occupational History  . Not on file.   Social History Main Topics  . Smoking status: Former Smoker    Packs/day: 1.00    Years: 30.00    Types: Cigarettes    Start date: 08/05/1983    Quit date: 10/11/2016  . Smokeless tobacco: Never Used     Comment: quit smoking 4 months ago  . Alcohol use No  . Drug use: No  . Sexual activity: Not on file   Other Topics Concern  . Not on file   Social History Narrative  . No narrative on file     Family History  Problem Relation Age of Onset  . Heart disease Mother   . Diabetes Father   . Stroke Brother       Review of Systems: All other systems reviewed and are otherwise negative except as noted above.  Physical Exam: Vitals:   12/23/16 1649 12/23/16 2100 12/24/16 0500 12/24/16 0934  BP: 118/74 (!) 154/106 131/83   Pulse: 80 93 72   Resp: 20 20 20    Temp: 98.8 F (37.1 C) 98.8 F (37.1 C) 98.6 F (37 C)   TempSrc: Oral Oral Oral   SpO2: 96% 94% 97%  98%  Weight:      Height:        GEN- The patient is well appearing, alert and oriented x 3 today.   Head- normocephalic, atraumatic Eyes-  Sclera clear, conjunctiva pink Ears- hearing intact Oropharynx- clear Neck- supple Lungs- CTA b/l,  normal work of breathing Heart- RRR, no murmurs, rubs or gallops  GI- soft, NT, ND Extremities- no clubbing, cyanosis, or edema MS- no significant deformity or atrophy Skin- no rash or lesion Psych- euthymic mood, full affect   Labs:   Lab Results  Component Value Date   WBC 6.9 12/23/2016   HGB 14.3 12/23/2016   HCT 46.0 12/23/2016   MCV 91.8 12/23/2016   PLT 274 12/23/2016    Recent Labs Lab 12/23/16 0443 12/24/16 0411  NA 139 139  K 3.1* 3.1*  CL 100* 101  CO2 29 29  BUN 9 9  CREATININE 1.22 1.13  CALCIUM 9.3 9.4  PROT 6.1*  --   BILITOT 0.5  --   ALKPHOS 75  --   ALT 17  --   AST 18  --   GLUCOSE 72 96   Lab Results  Component Value Date   TROPONINI 0.03 (HH) 11/13/2016   Lab Results  Component Value Date   CHOL 90 12/24/2016   Lab Results  Component Value Date   HDL 25 (L) 12/24/2016   Lab Results  Component Value Date   LDLCALC 37 12/24/2016   Lab Results  Component Value Date   TRIG 140 12/24/2016   Lab Results  Component Value Date   CHOLHDL 3.6 12/24/2016   No results found for: LDLDIRECT  Lab Results  Component Value Date   DDIMER 0.47 11/12/2014     Radiology/Studies:  Ct Angio Head W Or Wo Contrast Result Date: 12/22/2016 CLINICAL DATA:  61 year old male code stroke presentation with  left side weakness, possible visual changes. EXAM: CT ANGIOGRAPHY HEAD AND NECK CT PERFUSION BRAIN TECHNIQUE: Multidetector CT imaging of the head and neck was performed using the standard protocol during bolus administration of intravenous contrast. Multiplanar CT image reconstructions and MIPs were obtained to evaluate the vascular anatomy. Carotid stenosis measurements (when applicable) are obtained  utilizing NASCET criteria, using the distal internal carotid diameter as the denominator. Multiphase CT imaging of the brain was performed following IV bolus contrast injection. Subsequent parametric perfusion maps were calculated using RAPID software. CONTRAST:  140 mL Isovue 370 The initial CT perfusion contrast bolus was adequate, but the patient right antecubital fossa IV ruptured an infiltrated in during the initial cta head and neck attempt. I was called to see the patient at 1200 hours. Associated soft tissue swelling in the right AC fossa, but the patient reported little discomfort. A new IV had been started at the left antecubital fossa. Appropriate post infiltration care orders for the right upper extremity are being placed. COMPARISON:  Head CT without contrast today 1137 hours. Cervical spine CT 09/16/2016. FINDINGS: CT Brain Perfusion Findings: Motion artifact occurred during this CTA source acquisition resulting in automatic software rejection of some imaging data from the early arterial phase. CBF (<30%) Volume: 0 mL Perfusion (Tmax>6.0s) volume: 17 mL of probably artifactual penumbra designated in the anterior left inferior frontal gyrus and at the same time the right occipital lobe. Mismatch Volume: Not applicable Infarction Location:  Not applicable CTA NECK Skeleton: Much of the dentition is absent. Cervical spine degeneration appears stable. No acute osseous abnormality identified. Upper chest: Superior mediastinal lipomatosis. No superior mediastinal lymphadenopathy. Upper lungs remarkable for atelectasis and some paraseptal emphysema or scarring. Other neck: Negative.  No cervical lymphadenopathy. Aortic arch: 3 vessel arch configuration with no arch atherosclerosis or stenosis. Right carotid system: Tortuous brachiocephalic artery and right CCA origin both with a mildly kinked appearance. No right cervical carotid atherosclerosis or stenosis. Normal right carotid bifurcation. Left carotid  system: Mild left CCA tortuosity. No cervical left carotid atherosclerosis or stenosis. Normal left carotid bifurcation. Vertebral arteries: No proximal right subclavian artery atherosclerosis or stenosis. Normal right vertebral artery origin. Normal right vertebral artery to the skullbase. No proximal left subclavian artery atherosclerosis or stenosis. Mild tortuosity. Normal left vertebral artery origin. Codominant vertebral arteries. Normal left vertebral artery to the skullbase. CTA HEAD Posterior circulation: Normal codominant distal vertebral arteries. Normal PICA origins. Vertebrobasilar junction is normal aside from mild fenestration (normal variant). Patent basilar artery without stenosis. Normal SCA and PCA origins. Posterior communicating arteries are diminutive or absent. Bilateral PCA branches are normal. Anterior circulation: Patent ICA siphons with minimal calcified plaque and no siphon stenosis. Patent carotid termini. Dominant left ACA A1 segment. Anterior communicating artery and bilateral ACA branches are within normal limits. Unusual or early left MCA branching from the left ICA terminus posteriorly (series 11, image 89). There is some superimposed venous contamination. Left MCA M1 segment and left MCA bifurcation are patent. There is questionable decreased enhancement of some middle left MCA M2 and M3 branches, best seen on series 16, image 29. No complete branch occlusion, and the other left MCA branches appear normal. Right MCA M1 segment, bifurcation, and right MCA branches appear normal. Venous sinuses: Patent. Anatomic variants: Dominant left ACA A1 segment. Mildly fenestrated vertebrobasilar junction. Review of the MIP images confirms the above findings IMPRESSION: 1. CT perfusion study degraded by motion artifact. No core infarct detected, and small areas of suggested penumbra in the  opposite hemispheres are low most likely artifact. 2. Negative for emergent large vessel occlusion. 3. #1  and #2 were discussed preliminarily with Dr. Kerney Elbe at 1206 hours. 4. Negative for large vessel atherosclerosis or stenosis at the aortic arch or in the head and neck. 5. Questionable and most likely artifactual decreased enhancement of left MCA middle sylvian branches (series 16 image 29). I confirmed with Dr. Kerney Elbe that this is contralateral to the patient's symptoms. 6. Otherwise negative intracranial CTA. 7. IV infiltration during the initial CTA attempt. Physical exam of the patient revealed swelling about the right AC fossa infiltration site, but no other complicating features. Appropriate post infiltration orders will be placed in the EMR. The above also discussed by telephone with Dr. Kerney Elbe on 12/22/2016 at 1237 hours. Electronically Signed   By: Genevie Ann M.D.   On: 12/22/2016 12:43    Mr Brain Wo Contrast Result Date: 12/22/2016 CLINICAL DATA:  LEFT-sided weakness and possible vision changes. History of hypertension, hyperlipidemia, diabetes, memory loss. EXAM: MRI HEAD WITHOUT CONTRAST MRA HEAD WITHOUT CONTRAST TECHNIQUE: Multiplanar, multiecho pulse sequences of the brain and surrounding structures were obtained without intravenous contrast. Angiographic images of the head were obtained using MRA technique without contrast. COMPARISON:  CT HEAD December 22, 2016 at 1137 hours FINDINGS: MRI HEAD FINDINGS- mildly motion degraded examination. BRAIN: 14 x 7 mm reduced diffusion within the RIGHT frontoparietal lobes, pre and postcentral gyri with low ADC values. No susceptibility artifact to suggest hemorrhage. The ventricles and sulci are normal for patient's age. Patchy to confluent supratentorial white matter FLAIR T2 hyperintensities. Prominent basal ganglia perivascular spaces associated with chronic small vessel ischemic disease. No suspicious parenchymal signal, masses or mass effect. No abnormal extra-axial fluid collections. VASCULAR: Normal major intracranial vascular flow voids  present at skull base. SKULL AND UPPER CERVICAL SPINE: No abnormal sellar expansion. No suspicious calvarial bone marrow signal. Craniocervical junction maintained. SINUSES/ORBITS: The mastoid air-cells and included paranasal sinuses are well-aerated. The included ocular globes and orbital contents are non-suspicious. OTHER: Patient is edentulous. MRA HEAD FINDINGS- moderately motion degraded examination ANTERIOR CIRCULATION: Normal flow related enhancement of the included cervical, petrous, cavernous and supraclinoid internal carotid arteries. Patent anterior communicating artery. Normal flow related enhancement of the anterior and middle cerebral arteries, including distal segments. No large vessel occlusion, high-grade stenosis, or definite aneurysm. POSTERIOR CIRCULATION: LEFT vertebral artery is dominant. Basilar artery is patent, with normal flow related enhancement of the main branch vessels. Normal flow related enhancement of the posterior cerebral arteries. No large vessel occlusion, high-grade stenosis, or definite aneurysm. ANATOMIC VARIANTS: None. Source images and MIP images were reviewed. IMPRESSION: MRI HEAD: Acute small RIGHT frontoparietal/MCA territory infarct. Moderate chronic small vessel ischemic disease. MRA HEAD: Moderately motion degraded examination without acute large vessel occlusion or severe stenosis. Electronically Signed   By: Elon Alas M.D.   On: 12/22/2016 22:49    Ct Head Code Stroke W/o Cm Result Date: 12/22/2016 CLINICAL DATA:  Code stroke.  Code stroke, left-sided weakness EXAM: CT HEAD WITHOUT CONTRAST TECHNIQUE: Contiguous axial images were obtained from the base of the skull through the vertex without intravenous contrast. COMPARISON:  CT head 09/16/2016 FINDINGS: Brain: Negative for acute infarct, hemorrhage, mass. Ventricle size normal. Patchy hypodensity in the cerebral white matter bilaterally appears chronic. Vascular: Negative for hyperdense vessel. Skull:  Negative Sinuses/Orbits: Mild mucosal edema in the frontal sinuses. Remaining sinuses clear. Normal orbit. Other: None ASPECTS ([place] Stroke Program Early CT Score) - Ganglionic level  infarction (caudate, lentiform nuclei, internal capsule, insula, M1-M3 cortex): 7 - Supraganglionic infarction (M4-M6 cortex): 3 Total score (0-10 with 10 being normal): 10 IMPRESSION: 1. No acute intracranial abnormality 2. ASPECTS is 10 Electronically Signed   By: Franchot Gallo M.D.   On: 12/22/2016 11:45    12-lead ECG are SR All prior EKG's in EPIC reviewed with no documented atrial fibrillation  Telemetry SR only noted  Assessment and Plan:  1. Cryptogenic stroke The patient presents with cryptogenic stroke.  The patient has a TEE planned for this afternoon.  I spoke at length with the patient and his family about monitoring for afib with either a 30 day event monitor or an implantable loop recorder.  Risks, benefits, and alteratives to implantable loop recorder were discussed with the patient today.   He has a primary cardiologist and discussed f/u with him regarding monitoring as well.  He has been having CP it appears for some tme and out patient w/u for this is in progress.  At this time, the patient is very clear in their decision to proceed with implantable loop recorder at this time, rather then out patient via his cardiologist.   Wound care was reviewed with the patient (keep incision clean and dry for 3 days).  Wound check will be scheduled for the patient.  Please call with questions.   Baldwin Jamaica, PA-C 12/24/2016  I have seen and examined this patient with Tommye Standard.  Agree with above, note added to reflect my findings.  On exam, RRR, no murmurs, lungs clear. Presented with left and weakness and difficulty with speech found to have a R MCA stroke. Has since recovered. No cause has been found. TEE planned for later today. Has had a TTE in the past showing an EF of 35-40%. Will plan for LINQ  implant. Risks and benefits discussed. Risks include but not limited to bleeding and infection. He understands the risks and has agreed to the procedure. Should his EF be lower than 35% on TEE, he would likely benefit from 30 day monitoring and repeat TTE as he would potentially have an ICD implanted.    Will M. Camnitz MD 12/24/2016 12:24 PM

## 2016-12-24 NOTE — Progress Notes (Signed)
   Subjective:  Patient denies chest pain, shortness of breath, new weakness/numbness. He did have about a one hour episode of left eye pain with some blurry vision yesterday which resolved when he went to sleep; he had no further pain. Patient and wife stated that he followed with his eye doctor last week and had a normal exam.  Objective:  Vital signs in last 24 hours: Vitals:   12/23/16 1402 12/23/16 1649 12/23/16 2100 12/24/16 0500  BP: (!) 160/103 118/74 (!) 154/106 131/83  Pulse: 81 80 93 72  Resp: 20 20 20 20   Temp: 98.8 F (37.1 C) 98.8 F (37.1 C) 98.8 F (37.1 C) 98.6 F (37 C)  TempSrc: Oral Oral Oral Oral  SpO2: 98% 96% 94% 97%  Weight:      Height:       Constitutional: NAD, pleasant CV: RRR, no mrg, no LE edema, pulses intact Resp: CTAB, no increased work of breathing Abd: NDNT Neuro: CN 2-12 intact other than still mild right facial droop. LLE 4/5 strength, LUE 4/5 but better from yesterday. Dysmetria improved but still present on L.  Assessment/Plan:  Active Problems:   Congestive heart failure (HCC)   Hypertension   Hyperlipidemia   Type 2 diabetes mellitus (HCC)   Chronic back pain   COPD (chronic obstructive pulmonary disease) (HCC)   Stroke (HCC)   Left-sided weakness  CVA:  Patient with new onset left upper and lower extremity weakness and numbness with last presumed normal ~ midnight on day of admission; risk factors include T2DM, HTN, HLD, h/o smoking and family history of CVAs in 42's. No known autoimmune or heme disorder. No known malignancy. CT head was negative; CTA was largely negative but motion degraded, no large vessel or carotid occlusions noted. Neurology was consulted in ED and evaluated patient. He had a recent echo in May 2018 which showed EF 35-40%; idiopathic NICM; no arrhythmia on EKG. MRI shows acute small right frontoparietal/MCA territory infarct; MRA is motion degraded but does not show acute large vessel occlusion or severe  stenosis. Neurology has requested a TEE to evaluate for cardiac source of thrombus; if negative will have loop recorder placed. Telemetry shows only some PVCs. --ASA, plavix --atorvastatin --Neuro consulted - appreciate their assistance --PT/OT/SLP consults - PT/OT/SLP outpatient, tub/shower seat - ordered  NICM HTN: Patient with idiopathic NICM with clear cardiac cath in 2013, normal myoview in May 2018. At home on losartan 100mg  daily; he was on metoprolol but d/c'd due to symptomatic bradycardia at last admission with no plans from his cardiologist to restart. --atorvastatin --asa --can restart losartan at discharge unless necessary before as he is normotensive  Combined CHF: Patient with last echo in May 2018 showing EF 54-65%, grade 1 diastolic dysfunction, severe inferior and inferoseptal hypokinesis. At home on lasix 30mg  BID, potassium 48mEq BID, losartan 100mg  daily. --can restart losartan as needed; normotensive now --replete potassium --hold lasix for now as appears close to euvolemic --follow bmets  T2DM: On Dulaglutide 0.75mg  weekly at home. A1c on admission is 6.6. --SSI-M ac/hs  COPD: On Breo and Incruse at home; has PRN 2L [place] oxygen which he rarely uses. No wheezing on exam. --Continue home meds --albuterol PRN  OSA: --CPAP at night  GERD: Patient continued on PPI.  BPH: Patient continued on tamsulosin 0.4mg  daily  H/o gout: Patient continued on allopurinol 300mg  daily  Dispo: Anticipated discharge in approximately 1-2 day(s).   Alphonzo Grieve, MD 12/24/2016, 8:59 AM Pager [phone]

## 2016-12-24 NOTE — Progress Notes (Signed)
Subjective: No acute overnight events. PT/OT saw yesterday, recommend outpatient f/u. Pt. Describes episode of eye pain with accompanying blurriness of left eye. Pt. Took zolpidem, went to sleep, and awoke in the AM without eye pain or blurriness. Pt. Has no other complaints.   Objective: Vital signs in last 24 hours: Vitals:   12/23/16 1402 12/23/16 1649 12/23/16 2100 12/24/16 0500  BP: (!) 160/103 118/74 (!) 154/106 131/83  Pulse: 81 80 93 72  Resp: '20 20 20 20  ' Temp: 98.8 F (37.1 C) 98.8 F (37.1 C) 98.8 F (37.1 C) 98.6 F (37 C)  TempSrc: Oral Oral Oral Oral  SpO2: 98% 96% 94% 97%  Weight:      Height:        Physical Exam: Gen: Well appearing, NAD CV: RRR, no MRG HEENT: No pain with palpation of frontal or maxillary sinuses. No tenderness to palpation of scalp or forehead. Pulm: CTAB Neuro: No left upper extremity spasticity on strength test. Mild left pronator drift. Mild sensation deficit in left V1, V2, V3 distribution. Minor dysmetria with left finger-to-nose test. Otherwise CN II-XII intact bilaterally. Extraocular movements intact, symmetric face, tongue midline. 5/5 strength right upper and lower extremities. 4/5 strength left upper and lower extremities.  Normal sensation right upper and lower extremities. Decreased sensation in left upper and lower extremities. Extremities: 1+ pitting edema lower extremities bilaterally Occular: Decreased peripheral vision bilaterally, more prominent on left with bedside eye exam  Lab Results: Basic Metabolic Panel:  Recent Labs Lab 12/23/16 0443 12/24/16 0411  NA 139 139  K 3.1* 3.1*  CL 100* 101  CO2 29 29  GLUCOSE 72 96  BUN 9 9  CREATININE 1.22 1.13  CALCIUM 9.3 9.4    CBC:  Recent Labs Lab 12/18/16 0804  12/22/16 1128 12/22/16 1133 12/23/16 0443  WBC 7.4  --  8.1  --  6.9  NEUTROABS 4.8  --  5.1  --   --   HGB 15.2  < > 14.7 16.0 14.3  HCT 46.5  < > 46.0 47.0 46.0  MCV 91  --  91.5  --  91.8  PLT 291   --  288  --  274  < > = values in this interval not displayed.  CBG:  Recent Labs Lab 12/22/16 2343 12/23/16 0820 12/23/16 1155 12/23/16 1630 12/23/16 2109 12/24/16 0604  GLUCAP 78 120* 92 114* 106* 109*   Hemoglobin A1C:  Recent Labs Lab 12/23/16 0443  HGBA1C 6.6*   Fasting Lipid Panel:  Recent Labs Lab 12/24/16 0411  CHOL 90  HDL 25*  LDLCALC 37  TRIG 140  CHOLHDL 3.6   Coagulation:  Recent Labs Lab 12/22/16 1128  LABPROT 12.9  INR 0.97    Medications:  Scheduled Meds: .  stroke: mapping our early stages of recovery book   Does not apply Once  . allopurinol  300 mg Oral Daily  . aspirin EC  81 mg Oral Daily  . atorvastatin  80 mg Oral q1800  . clopidogrel  75 mg Oral Daily  . DULoxetine  60 mg Oral Daily  . enoxaparin (LOVENOX) injection  40 mg Subcutaneous Q24H  . fluticasone furoate-vilanterol  1 puff Inhalation Daily  . gabapentin  300 mg Oral TID  . insulin aspart  0-15 Units Subcutaneous TID WC  . insulin aspart  0-5 Units Subcutaneous QHS  . pantoprazole  40 mg Oral Daily  . potassium chloride  40 mEq Oral BID  . primidone  50 mg  Oral Daily  . tamsulosin  0.4 mg Oral QPC breakfast  . umeclidinium bromide  1 puff Inhalation Daily   Continuous Infusions: . sodium chloride     PRN Meds:.acetaminophen **OR** [DISCONTINUED] acetaminophen (TYLENOL) oral liquid 160 mg/5 mL **OR** acetaminophen, albuterol, senna-docusate Assessment/Plan: Active Problems:   Congestive heart failure (HCC)   Hypertension   Hyperlipidemia   Type 2 diabetes mellitus (HCC)   Chronic back pain   COPD (chronic obstructive pulmonary disease) (HCC)   Stroke ([place])   Left-sided weakness   James Zuniga is a 61 y/o right-handed man with PMH of CHF (EF 35-40%), COPD (CPAP for sleep, 2L O2 PRN), hypertension, iron deficiency anemia, and left sciatica (occupational back injury 30 yrs. Ago) being managed for right MCA ischemic stroke.  Left-sided weakness:MRI of brain  significant for acute small right frontoparietal/MCA territory infarct, as well as moderate chronic small vessel ischemic disease. Embolic vs. Thrombotic still unclear. Neurology further investigating, we appreciate their assistance with this case. Pt. Same condition, possibly some improvement in strength. PT/OT recommend outpt. F/u.  -TEE today per Neurology -Aspirin 81 mg QD -Lovenox DVT ppx -Atorvastatin 80 mg QD -PT/OT and SLP recommend outpt. f/u  Eye pain w/ blurriness:  Pt. Describes episode of eye pain with accompanying blurriness of left eye last night. Notably, the Pt. Described eye discomfort without further elaboration on admission that went away within the first hospital day. DDx. Includes acute angle closure glaucoma, TIA, vitreous hemorrhage, retinal artery/vein occlusion, giant cell arteritis. Given the transient nature of the pain/blurriness, acute glaucoma less likely. Given exam findings, chronic glaucoma possible. Given painful presentation, vitreous hemorrhage and retinal artery/vein occlusion less likely esp given transient nature. TIA/amaurosis fugax less likely given presence of  pain, no vision loss during episode, and lack of carotid atherosclerotic disease. GCA possible given hx of headache (contralateral to eye pain/vision disturbance), but less likely given negative exam findings.  -Consider ESR for GCA -Outpatient occular exam, esp. Given hx. Of HTN, DM 2  Hypokalemia: Hx. Of hypokalemia w/ hospitalization 11/12/2016 for K+<2.0. Pt. Was on furosemide and K+ supplement at time. K+ 3.1 today and yesterday. Pt. Asymptomatic. Furosemide, K+ supplement, and losartan have been held since admission. K+ mildly low, stable, and causing no sxs.  -Trend BMP during hospital stay -PO K+ supplement if K+ down trends or pt. Symptomatic  HTN: From recent clinic visits, BP run upper 130s/upper 80s. BP 140s/100s on admission, stable.  -Hold home losartan 100 mg, allow for permissive  hypertension  CHF: EF 35-40% May 1740, grade 1 diastolic dysfunction. Pt. Denies dyspnea, SOB. Mild pitting edema on exam. Hx. Of hypokalemia. Stable.  -hold home furosemide 20 mg BID, allow for permissive hypertension -hold home Potassium chloride  COPD: 2017 PFTs show FVC (79%), FEV1 (70%), FEV1/FVC (110%). Pt. Uses CPAP at night, 2L O2 PRN during day. Currently no SOB, difficulty breathing, cough/sputum, and exam reveals clear lungs bilaterally.  -Home albuterol, Breo ellipta, fluticasone/umeclidin/vilant, tiotropium  Gout:stable.  -home allopurinol 300 mg qd  Sciatica: Hx. Of sciatica of left leg secondary to back injury 30 years ago. Stable.  -home tizanidine 4 mg q8h prn -home duloxetine 60 mg Qd  -gabapentin 300 mg TID  Diabetes mellitus 2: Pt. Reports blood glucose has recently been in the 200s. Last A1C 6.6 yesterday. Stable serum glucose levels.  -SSI -Hold home medications   Bladder outlet obstruction:Denies dysuria or difficulty starting or stopping stream.  -Home tamsulosin 0.4 mg QD  Dispo: floor GI/FEN: heart healthy/carb  modified DVT ppx: lovenox   This is a Careers information officer Note.  The care of the patient was discussed with Dr. Jari Favre and the assessment and plan formulated with their assistance.  Please see their attached note for official documentation of the daily encounter.   LOS: 2 days   James Zuniga, Medical Student 12/24/2016, 8:33 AM

## 2016-12-24 NOTE — Progress Notes (Signed)
Dr. Curt Bears in d/w Dr. Harrington Challenger post TEE LVEF 25-30%.  Given this do not recommend he undergo loop implant (given potential need for ICD).  The patient has a primary cardiologist who he is actively following with.   At this time recommend adding Toprol to his regime to better treat his cardiomyopathy, noting recently his lopressor was stopped for bradycardia though this in the environment of severe hypokalemia. I spoke with neurology ARNP, and is OK from their standpoint from a BP perspective post CVA to add on BB and resume his home cardiac meds.  Dr. Curt Bears discussed this with the patient as well as his wife and family.  They state understanding and the importance of early follow up with his cardiologist to review his TEE for further recommendations/testing/ ICD implant.  Tommye Standard, PA-C

## 2016-12-24 NOTE — Progress Notes (Signed)
12/24/2016- Respiratory care note- Pt wearing cpap for the night and tolerating well.

## 2016-12-24 NOTE — Progress Notes (Addendum)
STROKE TEAM PROGRESS NOTE   HISTORY OF PRESENT ILLNESS (per record) James Zuniga is an 61 y.o. male with a history of CHF, CAD, COPD, diabetes mellitus, hypertension, and peripheral vascular disease who presented 12/22/2016 for acute onset LUE incoordination and weakness.   He woke up at about 0130 and noticed that he was having difficulty using his left hand to adjust his CPAP mask. He then went back to bed. When his wife arrived home later this morning (she is a CNA), she noted that he was not quite right and took him to his PCP, who also noted a deficit and had him transported emergently to the ED. Code Stroke was called by EMS en route when patient began having difficulty seeing and with his speech.  LKN was 0005 on 12/22/2016.   Patient was not administered IV t-PA due to arriving outside of the treatment window.  He was admitted to General Neurology for further evaluation and treatment.   SUBJECTIVE (INTERVAL HISTORY) His family is at the bedside.  The patient is awake, alert, and oriented. He is going for TEE today   OBJECTIVE Temp:  [98.4 F (36.9 C)-98.8 F (37.1 C)] 98.4 F (36.9 C) (06/14 1314) Pulse Rate:  [72-93] 72 (06/14 0500) Cardiac Rhythm: Normal sinus rhythm (06/14 0700) Resp:  [20-28] 28 (06/14 1314) BP: (118-168)/(74-115) 168/115 (06/14 1314) SpO2:  [94 %-98 %] 96 % (06/14 1314)  CBC:  Recent Labs Lab 12/18/16 0804  12/22/16 1128 12/22/16 1133 12/23/16 0443  WBC 7.4  --  8.1  --  6.9  NEUTROABS 4.8  --  5.1  --   --   HGB 15.2  < > 14.7 16.0 14.3  HCT 46.5  < > 46.0 47.0 46.0  MCV 91  --  91.5  --  91.8  PLT 291  --  288  --  274  < > = values in this interval not displayed.  Basic Metabolic Panel:   Recent Labs Lab 12/23/16 0443 12/24/16 0411  NA 139 139  K 3.1* 3.1*  CL 100* 101  CO2 29 29  GLUCOSE 72 96  BUN 9 9  CREATININE 1.22 1.13  CALCIUM 9.3 9.4    Lipid Panel:     Component Value Date/Time   CHOL 90 12/24/2016 0411   TRIG 140  12/24/2016 0411   HDL 25 (L) 12/24/2016 0411   CHOLHDL 3.6 12/24/2016 0411   VLDL 28 12/24/2016 0411   LDLCALC 37 12/24/2016 0411   HgbA1c:  Lab Results  Component Value Date   HGBA1C 6.6 (H) 12/23/2016   Urine Drug Screen:     Component Value Date/Time   LABOPIA NONE DETECTED 12/22/2016 1759   COCAINSCRNUR NONE DETECTED 12/22/2016 1759   LABBENZ NONE DETECTED 12/22/2016 1759   AMPHETMU NONE DETECTED 12/22/2016 1759   THCU NONE DETECTED 12/22/2016 1759   LABBARB NONE DETECTED 12/22/2016 1759    Alcohol Level No results found for: ETH  IMAGING  Ct Angio Head W Or Wo Contrast, Ct Angio Neck W Or Wo Contrast, Ct Cerebral Perfusion W Contrast 12/22/2016 IMPRESSION: 1. CT perfusion study degraded by motion artifact. No core infarct detected, and small areas of suggested penumbra in the opposite hemispheres are low most likely artifact. 2. Negative for emergent large vessel occlusion. 3. Negative for large vessel atherosclerosis or stenosis at the aortic arch or in the head and neck. 4. Questionable and most likely artifactual decreased enhancement of left MCA middle sylvian branches (series 16 image 29).  I confirmed with Dr. Kerney Elbe that this is contralateral to the patient's symptoms. 5. Otherwise negative intracranial CTA.  Mr Brain Wo Contrast, Mr Jodene Nam Head/brain Wo Cm 12/22/2016 IMPRESSION: MRI HEAD: Acute small RIGHT frontoparietal/MCA territory infarct. Moderate chronic small vessel ischemic disease. MRA HEAD: Moderately motion degraded examination without acute large vessel occlusion or severe stenosis.   Ct Head Code Stroke W/o Cm 12/22/2016 IMPRESSION: 1. No acute intracranial abnormality 2. ASPECTS is 10.    PHYSICAL EXAM   Physical exam: Exam: Gen: NAD, conversant, well nourised                     CV: RRR, no MRG. No Carotid Bruits. No peripheral edema, warm, nontender Eyes: Conjunctivae clear without exudates or hemorrhage  Neuro: Detailed Neurologic  Exam  Speech:    Speech is normal; fluent and spontaneous with normal comprehension.  Cognition:    The patient is oriented to person, place, and time;     recent and remote memory intact;     language fluent;     normal attention, concentration,     fund of knowledge Cranial Nerves:    The pupils are equal, round, and reactive to light.  Visual fields are full to finger confrontation. Extraocular movements are intact. Trigeminal sensation is intact and the muscles of mastication are normal. The face is symmetric. The palate elevates in the midline. Hearing intact. Voice is normal. Shoulder shrug is normal. The tongue has normal motion without fasciculations.   Coordination:    Left arm dysmetria > left leg  Motor Observation:    No asymmetry, no atrophy, and no involuntary movements noted. Tone:    Normal muscle tone.      Strength: Left arm and leg 4/5 strength otherwise strength is V/V in the upper and lower limbs.      Sensation: intact to LT     Reflex Exam:  ASSESSMENT/PLAN [name] is a 61 y.o. male with history of  CHF, CAD, diabetes mellitus, hypertension, and peripheral vascular disease presenting with LUE incoordination and weakness. He did not receive IV t-PA due to arriving outside of the treatment window.   Stroke:  Acute small right frontoparietal/MCA territory infarct likely embolic from unknown source  Resultant left arm>leg weakness and sensory loss  CT head: No stroke  MRI head: acute small cortical right frontoparietal/MCA territory infarct   TEE: LVEF severely depressed  RVEF depressed.  loop deferred and needs follow up with cardiology  MRA head: no acute large vessel occlusion or stenosis Carotid dopplers: See CTA neck (Negative for large vessel atherosclerosis or stenosis at the aortic arch or in the head and neck.)  2D Echo  pending  TEE with   LDL 37  HgbA1c 6.6  Lovenox 40 mg sq daily for VTE prophylaxis Diet NPO time  specified  aspirin 81 mg daily prior to admission, now on aspirin 81 mg daily. Recommend Plavix on discharge.  Patient counseled to be compliant with his antithrombotic medications  Ongoing aggressive stroke risk factor management  Therapy recommendations: none  Disposition: pending  Hypertension  Stable Permissive hypertension (OK if < 220/120) but gradually normalize in 5-7 days Long-term BP goal normotensive  Hyperlipidemia  Home meds: atorvastatin 20 mg PO daily, increased to 80 mg PO daily. Ok to decrease back to 20mg , his ldl is 37.  LDL 37, goal < 70  Continue statin at discharge  Diabetes  HgbA1c 6.6, goal < 7.0  Controlled  Other Stroke Risk Factors  Obesity, Body mass index is 34.7 kg/m., recommend weight loss, diet and exercise as appropriate   Family hx stroke (brother)  Coronary artery disease  Obstructive sleep apnea, on CPAP at home, encouraged compliance  Other Active Problems  None  Hospital day # 2   Personally examined patient and images, and have participated in and made any corrections needed to history, physical, neuro exam,assessment and plan as stated above.  I have personally obtained the history, evaluated lab date, reviewed imaging studies and agree with radiology interpretations.   Patient with cryptogenic embolic stroke. Workup without etiology, pending TEE and echo, possibly loop. When this is completed neurology will sign off. Needs follow up in 2 months with Cecille Rubin at [facility].  Neurology will sign off at this time.  Sarina Ill, MD Stroke Neurology   To contact Stroke Continuity provider, please refer to [email or web address] After hours, contact General Neurology

## 2016-12-24 NOTE — Progress Notes (Signed)
OT Cancellation Note  Patient Details Name: James Zuniga MRN: [id number] DOB: 09-26-1955   Cancelled Treatment:    Reason Eval/Treat Not Completed: Patient at procedure or test/ unavailable. OT will check back as schedule allows.  [place] 12/24/2016, 1:08 PM  Hulda Humphrey OTR/L [phone]

## 2016-12-24 NOTE — Progress Notes (Signed)
Physical Therapy Treatment Patient Details Name: James Zuniga MRN: [id number] DOB: 02-Dec-1955 Today's Date: 12/24/2016    History of Present Illness [name] is a 61 y/o male with PMH of CHF, COPD (CPAP for sleep, 2L O2 PRN), hypertension, iron deficiency anemia, and left sciatica who presented to the ED on 12/22/16 with left sided sensory changes and weakness. MRI revealed Acute small RIGHT frontoparietal/MCA territory infarct.    PT Comments    Pt making progress with mobility. PT will continue to follow acutely.   Follow Up Recommendations  Outpatient PT     Equipment Recommendations  None recommended by PT    Recommendations for Other Services       Precautions / Restrictions Precautions Precautions: Fall Restrictions Weight Bearing Restrictions: No    Mobility  Bed Mobility Overal bed mobility: Modified Independent                Transfers Overall transfer level: Needs assistance Equipment used: Straight cane Transfers: Sit to/from Stand Sit to Stand: Supervision         General transfer comment: supervision for safety  Ambulation/Gait Ambulation/Gait assistance: Supervision Ambulation Distance (Feet): 200 Feet Assistive device: Straight cane Gait Pattern/deviations: Step-through pattern;Decreased stride length Gait velocity: decreased Gait velocity interpretation: Below normal speed for age/gender General Gait Details: lack of arm swing on L, modest instability but no overt LOB or need for physical assistance, close min guard for safety   Stairs            Wheelchair Mobility    Modified Rankin (Stroke Patients Only) Modified Rankin (Stroke Patients Only) Pre-Morbid Rankin Score: Slight disability Modified Rankin: Moderate disability     Balance Overall balance assessment: Needs assistance Sitting-balance support: No upper extremity supported;Feet supported Sitting balance-Leahy Scale: Good     Standing balance support: During  functional activity;No upper extremity supported Standing balance-Leahy Scale: Fair                              Cognition Arousal/Alertness: Awake/alert Behavior During Therapy: WFL for tasks assessed/performed Overall Cognitive Status: Impaired/Different from baseline Area of Impairment: Memory                     Memory: Decreased short-term memory         General Comments: pt reported having issues with his memory but unable to fully describe      Exercises      General Comments        Pertinent Vitals/Pain Pain Assessment: No/denies pain    Home Living     Available Help at Discharge: Family;Available PRN/intermittently (wife workds third shift, daughters live in neighborhood)                Prior Function            PT Goals (current goals can now be found in the care plan section) Acute Rehab PT Goals PT Goal Formulation: With patient Time For Goal Achievement: 01/06/17 Potential to Achieve Goals: Good Progress towards PT goals: Progressing toward goals    Frequency    Min 4X/week      PT Plan Current plan remains appropriate    Co-evaluation              AM-PAC PT "6 Clicks" Daily Activity  Outcome Measure  Difficulty turning over in bed (including adjusting bedclothes, sheets and blankets)?: None Difficulty moving from lying on back  to sitting on the side of the bed? : None Difficulty sitting down on and standing up from a chair with arms (e.g., wheelchair, bedside commode, etc,.)?: A Little Help needed moving to and from a bed to chair (including a wheelchair)?: A Little Help needed walking in hospital room?: A Little Help needed climbing 3-5 steps with a railing? : A Little 6 Click Score: 20    End of Session   Activity Tolerance: Patient tolerated treatment well Patient left: with call bell/phone within reach;with family/visitor present;Other (comment) (sitting EOB) Nurse Communication: Mobility  status PT Visit Diagnosis: Other abnormalities of gait and mobility (R26.89);Other symptoms and signs involving the nervous system (J09.643)     Time: 8381-8403 PT Time Calculation (min) (ACUTE ONLY): 10 min  Charges:  $Gait Training: 8-22 mins                    G Codes:       [place], [place], [place] 12/24/2016, 1:42 PM

## 2016-12-25 ENCOUNTER — Encounter (INDEPENDENT_AMBULATORY_CARE_PROVIDER_SITE_OTHER): Payer: Self-pay

## 2016-12-25 ENCOUNTER — Inpatient Hospital Stay (HOSPITAL_COMMUNITY): Payer: PPO

## 2016-12-25 DIAGNOSIS — I360 Nonrheumatic tricuspid (valve) stenosis: Secondary | ICD-10-CM

## 2016-12-25 LAB — ECHOCARDIOGRAM COMPLETE
Height: 66 in
Weight: 3396.85 oz

## 2016-12-25 LAB — BASIC METABOLIC PANEL
Anion gap: 10 (ref 5–15)
BUN: 9 mg/dL (ref 6–20)
CHLORIDE: 101 mmol/L (ref 101–111)
CO2: 28 mmol/L (ref 22–32)
Calcium: 9.5 mg/dL (ref 8.9–10.3)
Creatinine, Ser: 1.11 mg/dL (ref 0.61–1.24)
GFR calc non Af Amer: >60 mL/min (ref >60)
Glucose, Bld: 85 mg/dL (ref 65–99)
POTASSIUM: 3.3 mmol/L — AB (ref 3.5–5.1)
SODIUM: 139 mmol/L (ref 135–145)

## 2016-12-25 LAB — GLUCOSE, CAPILLARY
GLUCOSE-CAPILLARY: 90 mg/dL (ref 65–99)
GLUCOSE-CAPILLARY: 124 mg/dL — AB (ref 65–99)
GLUCOSE-CAPILLARY: 91 mg/dL (ref 65–99)

## 2016-12-25 LAB — MAGNESIUM: MAGNESIUM: 1.7 mg/dL (ref 1.7–2.4)

## 2016-12-25 MED ORDER — CLOPIDOGREL BISULFATE 75 MG PO TABS: 75.0000 mg | ORAL_TABLET | Freq: Every day | ORAL | 2 refills | Status: DC

## 2016-12-25 MED ORDER — METOPROLOL SUCCINATE ER 25 MG PO TB24: 25.0000 mg | ORAL_TABLET | Freq: Every day | ORAL | 2 refills | Status: DC

## 2016-12-25 MED ORDER — MAGNESIUM SULFATE 2 GM/50ML IV SOLN
2.0000 g | Freq: Once | INTRAVENOUS | Status: AC
  Administered 2016-12-25: 2 g via INTRAVENOUS
  Filled 2016-12-25: qty 50

## 2016-12-25 MED ORDER — ATORVASTATIN CALCIUM 80 MG PO TABS: 80.0000 mg | ORAL_TABLET | ORAL | 0 refills | Status: AC

## 2016-12-25 MED ORDER — POTASSIUM CHLORIDE CRYS ER 20 MEQ PO TBCR
60.0000 meq | EXTENDED_RELEASE_TABLET | Freq: Two times a day (BID) | ORAL | Status: DC
  Administered 2016-12-25 (×2): 60 meq via ORAL
  Filled 2016-12-25 (×2): qty 3

## 2016-12-25 NOTE — Care Management Important Message (Signed)
Important Message  Patient Details  Name: James Zuniga MRN: [id number] Date of Birth: [birth year]   Medicare Important Message Given:  Yes    Cuthbert Turton [address] 12/25/2016, 1:15 PM

## 2016-12-25 NOTE — Care Management Note (Addendum)
Case Management Note  Patient Details  Name: James Zuniga MRN: [id number] Date of Birth: 03-10-1956  Subjective/Objective:                    Action/Plan: Patient discharging home with self care. CM consulted for outpatient therapy. CM met with the patient and his wife and they would like to attend [facility]. Orders in EPIC and information on the AVS.  Pt is concerned about the co pays for the rehab. He will discuss this with Wylie Hail when they call to arrange first appointment.  Pt with orders for tub bench. Pt wants to obtain this equipment outside the hospital. It is not covered under his insurance.  Wife to transport home.   Expected Discharge Date:  12/25/16               Expected Discharge Plan:  OP Rehab  In-House Referral:     Discharge planning Services  CM Consult  Post Acute Care Choice:    Choice offered to:     DME Arranged:    DME Agency:     HH Arranged:    HH Agency:     Status of Service:  Completed, signed off  If discussed at [employer] of Stay Meetings, dates discussed:    Additional Comments:  Pollie Friar, RN 12/25/2016, 11:45 AM

## 2016-12-25 NOTE — Progress Notes (Signed)
Subjective:  Patient denies chest pain, shortness of breath or worsening weakness. He had another episode of eye pain last night that resolved with sleep again.   Objective:  Vital signs in last 24 hours: Vitals:   12/25/16 0405 12/25/16 0406 12/25/16 0455 12/25/16 0900  BP:   129/86 (!) 147/96  Pulse:   72 81  Resp:   18 18  Temp:   98.9 F (37.2 C) 98.5 F (36.9 C)  TempSrc:   Oral Oral  SpO2:   100% 98%  Weight: 212 lb 4.9 oz (96.3 kg) 212 lb 4.9 oz (96.3 kg)    Height:       Constitutional: NAD, pleasant CV: RRR, no mrg, no LE edema, pulses intact Resp: CTAB, no increased work of breathing Abd: NDNT Neuro: CN 2-12 intact. LLE 4/5 strength, LUE 4/5. R sided extremities 5/5.   Assessment/Plan:  Active Problems:   Congestive heart failure (HCC)   Hypertension   Hyperlipidemia   Type 2 diabetes mellitus (HCC)   Chronic back pain   COPD (chronic obstructive pulmonary disease) (HCC)   Stroke (HCC)   Left-sided weakness  CVA:  Patient with new onset left upper and lower extremity weakness and numbness with last presumed normal ~ midnight on day of admission; risk factors include T2DM, HTN, HLD, h/o smoking and family history of CVAs in 26's. No known autoimmune or heme disorder. No known malignancy. CT head was negative; CTA was largely negative but motion degraded, no large vessel or carotid occlusions noted. Neurology was consulted in ED and evaluated patient. He had a recent echo in May 2018 which showed EF 35-40%; idiopathic NICM; no arrhythmia on EKG. MRI shows acute small right frontoparietal/MCA territory infarct; MRA is motion degraded but does not show acute large vessel occlusion or severe stenosis. TEE yesterday did not show a thrombus or significant valvular disease; however his LVEF was down to 25-30%. Telemetry shows only some PVCs. Patient to follow up with his cardiologist for further workup and consideration for ICD; loop recorder will not be placed yet as he  may be getting an ICD. --ASA, plavix --atorvastatin --Neuro consulted - appreciate their assistance - s/o --PT/OT/SLP consults - PT/OT/SLP outpatient, tub/shower seat - ordered  NICM HTN: Patient with idiopathic NICM with clear cardiac cath in 2013, normal myoview in May 2018. At home on losartan 100mg  daily. TEE yesterday did not show a thrombus or significant valvular disease; however his LVEF was down to 25-30%. Telemetry shows only some PVCs. Patient to follow up with his cardiologist for further workup and consideration for ICD; loop recorder will not be placed yet as he may be getting an ICD. Metoprolol was started yesterday-tele shows 2 episodes of brady to 48 while patient was sleeping otherwise HR was stable --atorvastatin --asa --metoprolol 25mg  BID and losartan  Combined CHF: Patient with last echo in May 2018 showing EF 96-29%, grade 1 diastolic dysfunction, severe inferior and inferoseptal hypokinesis. At home on lasix 30mg  BID, potassium 57mEq BID, losartan 100mg  daily. --metoprolol and losartan  --replete potassium, magnesium --hold lasix for now as appears close to euvolemic --follow bmets  T2DM: On Dulaglutide 0.75mg  weekly at home. A1c on admission is 6.6. --SSI-M ac/hs  COPD: On Breo and Incruse at home; has PRN 2L [place] oxygen which he rarely uses. No wheezing on exam. --Continue home meds --albuterol PRN  OSA: --CPAP at night  GERD: Patient continued on PPI.  BPH: Patient continued on tamsulosin 0.4mg  daily  H/o gout: Patient  continued on allopurinol 300mg  daily  Dispo: Anticipated discharge today.   Alphonzo Grieve, MD 12/25/2016, 11:41 AM Pager [phone]

## 2016-12-25 NOTE — Progress Notes (Signed)
Pt discharged home. Discharge instructions were reviewed with the patient. Pt and wife verbalized understanding.

## 2016-12-25 NOTE — Discharge Summary (Signed)
Name: James Zuniga MRN: [id number] DOB: 11-Jan-1956 61 y.o. PCP: Berkley Harvey, NP  Date of Admission: 12/22/2016 11:21 AM Date of Discharge: 12/25/2016 Attending Physician: Lucious Groves, DO  Discharge Diagnosis: 1. CVA 2. Systolic CHF Active Problems:   Congestive heart failure (HCC)   Hypertension   Hyperlipidemia   Type 2 diabetes mellitus (HCC)   Chronic back pain   COPD (chronic obstructive pulmonary disease) (HCC)   Stroke (HCC)   Left-sided weakness   Discharge Medications: Allergies as of 12/25/2016   No Known Allergies     Medication List    STOP taking these medications   tiotropium 18 MCG inhalation capsule Commonly known as:  SPIRIVA     TAKE these medications   albuterol 108 (90 Base) MCG/ACT inhaler Commonly known as:  PROVENTIL HFA;VENTOLIN HFA Inhale 1-2 puffs into the lungs every 6 (six) hours as needed for wheezing.   albuterol (2.5 MG/3ML) 0.083% nebulizer solution Commonly known as:  PROVENTIL Take 2.5 mg by nebulization every 6 (six) hours as needed for wheezing or shortness of breath.   allopurinol 300 MG tablet Commonly known as:  ZYLOPRIM Take 300 mg by mouth daily.   aspirin 81 MG EC tablet Take 81 mg by mouth daily. Swallow whole.   atorvastatin 80 MG tablet Commonly known as:  LIPITOR Take 1 tablet (80 mg total) by mouth every morning. What changed:  medication strength  how much to take   BREO ELLIPTA 100-25 MCG/INH Aepb Generic drug:  fluticasone furoate-vilanterol INL 1 PUFF PO D What changed:  how much to take  how to take this  when to take this  additional instructions   clopidogrel 75 MG tablet Commonly known as:  PLAVIX Take 1 tablet (75 mg total) by mouth daily. Start taking on:  12/26/2016   Dulaglutide 0.75 MG/0.5ML Sopn Inject 0.75 mg into the skin every 7 (seven) days. Tuesday of each week   DULoxetine 60 MG capsule Commonly known as:  CYMBALTA Take 60 mg by mouth daily.     Fluticasone-Umeclidin-Vilant 100-62.5-25 MCG/INH Aepb Inhale 1 puff into the lungs daily.   furosemide 20 MG tablet Commonly known as:  LASIX Take 20 mg by mouth 2 (two) times daily.   gabapentin 300 MG capsule Commonly known as:  NEURONTIN Take 300 mg by mouth 3 (three) times daily.   losartan 100 MG tablet Commonly known as:  COZAAR Take 100 mg by mouth daily.   metoprolol succinate 25 MG 24 hr tablet Commonly known as:  TOPROL-XL Take 1 tablet (25 mg total) by mouth daily. Start taking on:  12/26/2016   omeprazole 20 MG capsule Commonly known as:  PRILOSEC Take 20 mg by mouth daily.   OXYGEN Inhale 2 L into the lungs as needed.   potassium chloride SA 20 MEQ tablet Commonly known as:  K-DUR,KLOR-CON Take 1 tablet (20 mEq total) by mouth 2 (two) times daily.   primidone 50 MG tablet Commonly known as:  MYSOLINE Take 50 mg by mouth daily.   tamsulosin 0.4 MG Caps capsule Commonly known as:  FLOMAX Take 1 capsule (0.4 mg total) by mouth daily after breakfast.   testosterone cypionate 200 MG/ML injection Commonly known as:  DEPOTESTOSTERONE CYPIONATE Inject 200 mg into the muscle every 14 (fourteen) days.   tiZANidine 4 MG tablet Commonly known as:  ZANAFLEX 4 mg 3 (three) times daily as needed for muscle spasms.   traMADol 50 MG tablet Commonly known as:  ULTRAM Take 50  mg by mouth daily as needed for moderate pain.   umeclidinium bromide 62.5 MCG/INH Aepb Commonly known as:  INCRUSE ELLIPTA Inhale 1 puff into the lungs daily.   zolpidem 10 MG tablet Commonly known as:  AMBIEN Take 10 mg by mouth at bedtime.            Durable Medical Equipment        Start     Ordered   12/24/16 1139  For home use only DME Tub bench  Once     12/24/16 1138      Disposition and follow-up:   Mr.James Zuniga was discharged from [facility] in Stable condition.  At the hospital follow up visit please address:  1.   Cryptogenic CVA: --added  plavix to aspirin --residual left sided weakness --referred to outpatient PT, OT, speech --loop recorder not placed due to worsening heart failure due to possible need for ICD  Systolic CHF, nonischemic cardiomyopathy: --TEE showed worsening LVEF to 25-30%; however TTE showed 40-45% --Telemetry showed only some PVC's --loop recorder deferred in setting of worsening LV systolic function and possibility for need for ICD --Started on metoprolol 25mg  BID - no symptomatic bradycardia; continued losartan  2.  Labs / imaging needed at time of follow-up: Bmet for K, magnesium;    3.  Pending labs/ test needing follow-up: none  Follow-up Appointments: Follow-up Information    Flossie Buffy., MD Follow up.   Specialty:  Cardiology Why:  Follow up with your cardiologist with in the next 2 weeks to discuss heart monitoring and further work up and testing discussed today Contact information: [place] [address] [place] Follow up.   Specialty:  Rehabilitation Why:  they will contact you for the first appointment. Contact information: [address] [place] [id number] [place]       Berkley Harvey, NP. Schedule an appointment as soon as possible for a visit in 1 week(s).   Specialty:  Nurse Practitioner Contact information: [place] [address] [phone]        Dennie Bible, NP. Schedule an appointment as soon as possible for a visit.   Specialty:  Family Medicine Why:  to be seen in 2 months with neurology Contact information: [address] [place] [address] [phone]           Hospital Course by problem list: Active Problems:   Congestive heart failure ([place])   Hypertension   Hyperlipidemia   Type 2 diabetes mellitus ([place])   Chronic back pain   COPD (chronic obstructive  pulmonary disease) (HCC)   Stroke ([place])   Left-sided weakness   CVA: Patient with PMH of HTN, HLD, NICM, T2DM, h/o tobacco use presented to [facility] with new onset left upper and lower extremity weakness and numbness; in addition, on exam, he was found to have a slight right sided facial droop. Code stroke was called on his arrival to the ED; CT head wo contrast did not reveal an acute infarct or hemorrhage; CTA was largely negative but motion degraded, however no large vessel or carotid occlusions were noted. Neurology was consulted in the ED, and recommended MRI/A and transesophageal echocardiogram in addition to evaluation for further risk factors. MRI showed an acute small right frontoparietal/MCA territory infarct; MRA was motion degraded but did not reveal large vessel occlusion or  severe stenosis. TEE was obtained with plans to insert a loop recorder as well, however this was canceled after TEE revealed a decreased LVEF to 25-30% compared to his prior TTEs; it did not reveal a thrombus or significant valvular disease. A TTE was obtained right before discharge which showed an LVEF of 40-45% (an improvement even from prior TTEs). However, due to TEE results coming in first, cardiology recommended that patient follow up with his outpatient cardiologist for further evaluation for possible ICD placement. His telemetry monitoring during admission only showed occasional PVC's, without other arrhythmias. Patient was placed on dual anti-platelets with ASA and plavix for 3 months with likely downgrade to only plavix afterwards; his atorvastatin was increased to 80mg  daily. He will follow up with Neurology outpatient on one month. PT/OT/speech was consulted for evaluation and recommended outpatient treatment with all three, for which a referral was placed.   NICM Combined CHF HTN: Patient with idiopathic NICM with clear cardiac cath in 2013, normal myoview in May 2018. During stroke workup, a TEE estimated a  decreased LVEF to 23-30%; in lieu of this finding, cardiology placed patient back on metoprolol 25mg  BID which was previously discontinued due to symptomatic bradycardia; his losartan was continued. Telemetry monitoring after beginning metoprolol showed only 2 brief episodes of bradycardia to 48 bpm while patient was sleeping, otherwise he was stable and tolerated the addition well. TTE day of discharge, however, showed an LVEF of 40-45%. During hospitalization, his Lasix was held due to euvolemic state; he was instructed to restart day after discharge in addition to his home potassium. His potassium and magnesium were found to be low and were repleted. At follow up appointment, please check his Bmet and magnesium to evaluate for need for further repletion of K and Mg.   T2DM: Patient on Dulaglutide 0.75mg  weekly. During admission, his glucose was controlled with supplemented SSI of which he did not require much. Of note, CBG on arrival to ED was 70 but stabilized the rest of the admission. A1c was found to be 6.6.  COPD:  Patient was continued on his home Breo and Incruse.  OSA: Patient was provided a CPAP at night during his admission and tolerated it well.  Discharge Vitals:   BP (!) 147/96   Pulse 81   Temp 98.5 F (36.9 C) (Oral)   Resp 18   Ht 5\' 6"  (1.676 m)   Wt 212 lb 4.9 oz (96.3 kg)   SpO2 98%   BMI 34.27 kg/m   Pertinent Labs, Studies, and Procedures:  CBC Latest Ref Rng & Units 12/23/2016 12/22/2016 12/22/2016  WBC 4.0 - 10.5 K/uL 6.9 - 8.1  Hemoglobin 13.0 - 17.0 g/dL 14.3 16.0 14.7  Hematocrit 39.0 - 52.0 % 46.0 47.0 46.0  Platelets 150 - 400 K/uL 274 - 288   BMP Latest Ref Rng & Units 12/25/2016 12/24/2016 12/23/2016  Glucose 65 - 99 mg/dL 85 96 72  BUN 6 - 20 mg/dL 9 9 9   Creatinine 0.61 - 1.24 mg/dL 1.11 1.13 1.22  Sodium 135 - 145 mmol/L 139 139 139  Potassium 3.5 - 5.1 mmol/L 3.3(L) 3.1(L) 3.1(L)  Chloride 101 - 111 mmol/L 101 101 100(L)  CO2 22 - 32 mmol/L 28 29 29    Calcium 8.9 - 10.3 mg/dL 9.5 9.4 9.3   Magnesium 12/25/2016: 1.7 Lipid panel 12/24/2016: Chol 90; Triglycerides 140; HDL 25; LDL 37; Chol/HDL ratio 3.6 Hgb A1c 12/23/2016: 6.6 PT/INR 12/22/2016: 12.9/0.97  CT head wo contrast 12/22/2016: No acute intracranial  abnormality  CTA head/neck/cerebral perfusion w/wo contrast 12/22/2016: 1. CT perfusion study degraded by motion artifact. No core infarct detected, and small areas of suggested penumbra in the opposite hemispheres are low most likely artifact. 2. Negative for emergent large vessel occlusion. 3. #1 and #2 were discussed preliminarily with Dr. Kerney Elbe at 1206 hours. 4. Negative for large vessel atherosclerosis or stenosis at the aortic arch or in the head and neck. 5. Questionable and most likely artifactual decreased enhancement of left MCA middle sylvian branches (series 16 image 29). I confirmed with Dr. Kerney Elbe that this is contralateral to the patient's symptoms. 6. Otherwise negative intracranial CTA. 7. IV infiltration during the initial CTA attempt. Physical exam of the patient revealed swelling about the right AC fossa infiltration site, but no other complicating features. Appropriate post infiltration orders will be placed in the EMR.  MRI brain 12/22/2016: Acute small RIGHT frontoparietal/MCA territory infarct. Moderate chronic small vessel ischemic disease  MRA brain 12/22/2016: Moderately motion degraded examination without acute large vessel occlusion or severe stenosis  Transesophageal echocardiogram 12/24/2016: LVEF 25-30%  Echo complete 12/25/2016: - Left ventricle: The cavity size was moderately dilated. Wall   thickness was normal. Systolic function was mildly to moderately   reduced. The estimated ejection fraction was in the range of 40%   to 45%. - Mitral valve: Calcified annulus. Mildly thickened leaflets .   There was mild regurgitation. - Atrial septum: No defect or patent foramen ovale was  identified. - Pulmonary arteries: PA peak pressure: 33 mm Hg (S).  Discharge Instructions: Discharge Instructions    (HEART FAILURE PATIENTS) Call MD:  Anytime you have any of the following symptoms: 1) 3 pound weight gain in 24 hours or 5 pounds in 1 week 2) shortness of breath, with or without a dry hacking cough 3) swelling in the hands, feet or stomach 4) if you have to sleep on extra pillows at night in order to breathe.    Complete by:  As directed    Ambulatory referral to Neurology    Complete by:  As directed    2-3 months   Ambulatory referral to Occupational Therapy    Complete by:  As directed    Ambulatory referral to Ophthalmology    Complete by:  As directed    Intermittent eye pain in patient with diabetes and hypertension.   Ambulatory referral to Physical Therapy    Complete by:  As directed    Ambulatory referral to Speech Therapy    Complete by:  As directed    Call MD for:  difficulty breathing, headache or visual disturbances    Complete by:  As directed    Call MD for:  extreme fatigue    Complete by:  As directed    Call MD for:  hives    Complete by:  As directed    Call MD for:  persistant dizziness or light-headedness    Complete by:  As directed    Call MD for:  persistant nausea and vomiting    Complete by:  As directed    Call MD for:  redness, tenderness, or signs of infection (pain, swelling, redness, odor or green/yellow discharge around incision site)    Complete by:  As directed    Call MD for:  severe uncontrolled pain    Complete by:  As directed    Call MD for:  temperature >100.4    Complete by:  As directed    Discharge instructions  Complete by:  As directed    For your blood pressure and heart: --take metoprolol 25mg  twice a day --take losartan 100mg  once a day --you can start your lasix tomorrow - 20mg  twice a day --take potassium 7mEq twice a day starting tomorrow --please call your cardiologist today to set up an appointment to  be seen next week  For your stroke: --you will be taking a higher dose of atorvastatin, 80mg  once a day --take plavix 75mg  once a day and aspirin 81mg  once a day --please call the neurologist to set up a follow up appointment  Physical, occupational and speech therapy have been ordered; a referral for a specialized eye doctor is placed as well.   Increase activity slowly    Complete by:  As directed       Signed: Alphonzo Grieve, MD 12/25/2016, 1:39 PM   Pager [phone]

## 2016-12-25 NOTE — Progress Notes (Signed)
Transitions of Care Pharmacy Note  Plan:  Educated on new plavix and metoprolol. He is unsure if he is already taking metoprolol at home.   We discussed that if he does have metoprolol at home that he should take the new prescription prescribed at discharge. (Directed to AVS.) Addressed concerns regarding the cost of Plavix. Patient has insurance and uses a Environmental manager card at [employer] which should make Plavix affordable.   --------------------------------------------- James Zuniga is an 61 y.o. male who presents with a chief complaint of stroke and new onset of CHF. In anticipation of discharge, pharmacy has reviewed this patient's prior to admission medication history, as well as current inpatient medications listed per the [facility].  Current medication indications, dosing, frequency, and notable side effects reviewed with patient. patient verbalized understanding of current inpatient medication regimen and is aware that the After Visit Summary when presented, will represent the most accurate medication list at discharge.   James Zuniga expressed concerns regarding cost of Plavix. Explained that it is generic and affordable.    Assessment: Understanding of regimen: good Understanding of indications: fair Potential of compliance: good Barriers to Obtaining Medications: No  Patient instructed to contact inpatient pharmacy team with further questions or concerns if needed.    Time spent preparing for discharge counseling: 15 minutes  Time spent counseling patient: 15 minutes   Thank you for allowing pharmacy to be a part of this patient's care.  Ihor Austin, PharmD PGY1 Pharmacy Resident Pager: [phone]

## 2016-12-25 NOTE — Progress Notes (Signed)
Internal Medicine Attending:   I saw and examined the patient. I reviewed the resident's note and I agree with the resident's findings and plan as documented in the resident's note. Patient feels well no complaints.  TEE yesterday was neg for LAA thrombus however revealed that LVEF was down to 25-30% thus cardio wants to defer loop recorder as he may need ICD.  He will follow up with his cardiologist for this.  Otherwise he feels his strength may be improving slightly.  He will continue medical management for his CVA, ongoing control of DM and HTN.  He did intermittently have some left eye pain however has resolved quickly without intervention.  I would like him to have formal evaluation for this as an outpatient with Opthalmology.  Otherwise he may be discharged home today.

## 2016-12-25 NOTE — Progress Notes (Signed)
Occupational Therapy Treatment Patient Details Name: James Zuniga MRN: [id number] DOB: 03-26-1956 Today's Date: 12/25/2016    History of present illness James Zuniga is a 61 y/o male with PMH of CHF, COPD (CPAP for sleep, 2L O2 PRN), hypertension, iron deficiency anemia, and left sciatica who presented to the ED on 12/22/16 with left sided sensory changes and weakness. MRI revealed Acute small RIGHT frontoparietal/MCA territory infarct.   OT comments  Pt progressing towards OT goals this session. Pt and wife educated in fine motor coordination HEP, and theraputty HEP as well as PNF patterns for LUE. Ataxic movement noted, but improved strength from last session. OT will continue to follow acutely to reinforce education and HEP, however Pt at adequate level for dc from OT perspective.  Follow Up Recommendations  Outpatient OT;Supervision - Intermittent    Equipment Recommendations  Tub/shower seat    Recommendations for Other Services      Precautions / Restrictions Precautions Precautions: Fall Restrictions Weight Bearing Restrictions: No       Mobility Bed Mobility Overal bed mobility: Modified Independent             General bed mobility comments: Pt sitting EOB when OT arrived in room  Transfers                 General transfer comment: no transfers attempted this session    Balance                                           ADL either performed or assessed with clinical judgement   ADL                                               Vision       Perception     Praxis      Cognition Arousal/Alertness: Awake/alert Behavior During Therapy: WFL for tasks assessed/performed Overall Cognitive Status: Within Functional Limits for tasks assessed                                 General Comments: Pt provided with handouts for exercises to assist with STM        Exercises Exercises: Other exercises Other  Exercises Other Exercises: Theraputty (yellow) provided with written HEP Other Exercises: Generalized fine motor exercise handout provided and practiced Other Exercises: PNF pattern for LUE educated and practiced with Pt   Shoulder Instructions       General Comments Pt's wife present for session and education    Pertinent Vitals/ Pain       Pain Assessment: Faces Faces Pain Scale: Hurts a little bit Pain Location: head when bending over Pain Descriptors / Indicators: Headache Pain Intervention(s): RN gave pain meds during session;Monitored during session  Home Living                                          Prior Functioning/Environment              Frequency  Min 2X/week        Progress Toward Goals  OT Goals(current  goals can now be found in the care plan section)  Progress towards OT goals: Progressing toward goals  Acute Rehab OT Goals Patient Stated Goal: return to PLOF OT Goal Formulation: With patient Time For Goal Achievement: 01/06/17 Potential to Achieve Goals: Good  Plan Discharge plan remains appropriate;Frequency remains appropriate    Co-evaluation                 AM-PAC PT "6 Clicks" Daily Activity     Outcome Measure   Help from another person eating meals?: None Help from another person taking care of personal grooming?: A Little Help from another person toileting, which includes using toliet, bedpan, or urinal?: A Little Help from another person bathing (including washing, rinsing, drying)?: None Help from another person to put on and taking off regular upper body clothing?: None Help from another person to put on and taking off regular lower body clothing?: None 6 Click Score: 22    End of Session    OT Visit Diagnosis: Unsteadiness on feet (R26.81);Hemiplegia and hemiparesis Hemiplegia - Right/Left: Left Hemiplegia - dominant/non-dominant: Non-Dominant Hemiplegia - caused by: Cerebral infarction    Activity Tolerance Patient tolerated treatment well   Patient Left in bed;with call bell/phone within reach;with family/visitor present   Nurse Communication Mobility status        Time: 6644-0347 OT Time Calculation (min): 24 min  Charges: OT General Charges $OT Visit: 1 Procedure OT Treatments $Therapeutic Activity: 8-22 mins $Therapeutic Exercise: 8-22 mins  James Zuniga OTR/L [place] 12/25/2016, 11:09 AM

## 2016-12-25 NOTE — Progress Notes (Signed)
Subjective: No acute overnight events. The pt. Describes some minor left eye pain without visual changes yesterday evening relieved with sleep. No other complaints. Pt. Feels ready to go home.   Objective: Vital signs in last 24 hours: Vitals:   12/25/16 0405 12/25/16 0406 12/25/16 0455 12/25/16 0900  BP:   129/86 (!) 147/96  Pulse:   72 81  Resp:   18 18  Temp:   98.9 F (37.2 C) 98.5 F (36.9 C)  TempSrc:   Oral Oral  SpO2:   100% 98%  Weight: 96.3 kg (212 lb 4.9 oz) 96.3 kg (212 lb 4.9 oz)    Height:       Physical exam: Gen: Well appearing, NAD. CV: RRR, no MRG Pulm: CTAB Neuro: No left upper extremity spasticity on strength test. Mild left pronator drift. Mild sensation deficit in left V1, V2, V3distribution. Minor dysmetria with left finger-to-nose test. Otherwise CN II-XII intact bilaterally. Extraocular movements intact, symmetric face, tongue midline. 5/5 strength right upper and lower extremities. 4/5 strength left upper and lower extremities. Normal sensation right upper and lower extremities. Decreased sensation in left upper and lower extremities.  Lab Results: Basic Metabolic Panel:  Recent Labs Lab 12/24/16 0411 12/25/16 0539  NA 139 139  K 3.1* 3.3*  CL 101 101  CO2 29 28  GLUCOSE 96 85  BUN 9 9  CREATININE 1.13 1.11  CALCIUM 9.4 9.5  MG  --  1.7    CBG:  Recent Labs Lab 12/24/16 0604 12/24/16 1119 12/24/16 1608 12/24/16 2116 12/25/16 0635 12/25/16 1140  GLUCAP 109* 86 74 94 90 124*   Hemoglobin A1C:  Recent Labs Lab 12/23/16 0443  HGBA1C 6.6*   Fasting Lipid Panel:  Recent Labs Lab 12/24/16 0411  CHOL 90  HDL 25*  LDLCALC 37  TRIG 140  CHOLHDL 3.6   Coagulation:  Recent Labs Lab 12/22/16 1128  LABPROT 12.9  INR 0.97    Medications: Scheduled Meds: .  stroke: mapping our early stages of recovery book   Does not apply Once  . allopurinol  300 mg Oral Daily  . aspirin EC  81 mg Oral Daily  . atorvastatin  80 mg  Oral q1800  . clopidogrel  75 mg Oral Daily  . DULoxetine  60 mg Oral Daily  . enoxaparin (LOVENOX) injection  40 mg Subcutaneous Q24H  . fluticasone furoate-vilanterol  1 puff Inhalation Daily  . gabapentin  300 mg Oral TID  . insulin aspart  0-15 Units Subcutaneous TID WC  . insulin aspart  0-5 Units Subcutaneous QHS  . losartan  100 mg Oral Daily  . metoprolol succinate  25 mg Oral Daily  . pantoprazole  40 mg Oral Daily  . potassium chloride  60 mEq Oral BID  . primidone  50 mg Oral Daily  . tamsulosin  0.4 mg Oral QPC breakfast  . umeclidinium bromide  1 puff Inhalation Daily    Continuous Infusions: PRN Meds:.acetaminophen **OR** [DISCONTINUED] acetaminophen (TYLENOL) oral liquid 160 mg/5 mL **OR** acetaminophen, albuterol, senna-docusate, zolpidem Assessment/Plan: Active Problems:   Congestive heart failure (HCC)   Hypertension   Hyperlipidemia   Type 2 diabetes mellitus (HCC)   Chronic back pain   COPD (chronic obstructive pulmonary disease) (HCC)   Stroke ([place])   Left-sided weakness  James Zuniga is a 61 y/o right-handed man with PMH of CHF (EF 35-40%), COPD (CPAP for sleep, 2L O2 PRN), hypertension, iron deficiency anemia, and left sciatica (occupational back injury 30 yrs. Ago)  being managed for right MCA ischemic stroke.  Left-sided weakness:MRI of brain significant for acute small right frontoparietal/MCA territory infarct, as well as moderate chronic small vessel ischemic disease. Embolic vs. Thrombotic still unclear. TEE significant for decreased right ventricular EF and left ventricular EF (25-30%), intrapulmonary shunting. No masses in left atrium or left atrial appendage. Electrophysiology recommends against loop implant given potential need for ICD. No indication for further evaluation of cryptogenic ischemic stroke at this time.   -Discharge on home medications + metoprolol 24 hr tablet 25 mg QD, clopidogrel 75 mg QD -Outpt. PT, OT, speech therapy -Neurology  f/u  Eye pain w/ blurriness: Pt. Reports brief episode of mild left eye pain without concomitant vision change. No persistent pain, gross vision changes.  -Outpatient opthalmology   Hypokalemia: Hx. Of hypokalemia w/ hospitalization 11/12/2016 for K+<2.0. Mg yesterday at 1.7. K+ 3.3 this AM, responding to K+ supplement.  -Discharge with K+ supplement  HTN: From recent clinic visits, BP run upper 130s/upper 80s. BP 140s/100s on admission, stable.  -Hold home losartan 100 mg, allow for permissive hypertension  CHF: TEE significant for decreased right ventricular EF and left ventricular EF (25-30%), intrapulmonary shunting. No masses in left atrium or left atrial appendage. Electrophysiology recommends against loop implant given potential need for ICD.  -Discharge on home medications + metoprolol 24 hr tablet 25 mg QD, clopidogrel 75 mg QD -Early outpatient Cardiology f/u  COPD: 2017 PFTs show FVC (79%), FEV1 (70%), FEV1/FVC (110%).  Pt. Uses CPAP at night, 2L O2 PRN during day. Currently no SOB, difficulty breathing, cough/sputum, and exam reveals clear lungs bilaterally.  -Home albuterol, Breo ellipta, fluticasone/umeclidin/vilant, tiotropium  Gout:stable.  -home allopurinol 300 mg qd  Sciatica: Hx. Of sciatica of left leg secondary to back injury 30 years ago. Stable.  -home tizanidine 4 mg q8h prn -home duloxetine 60 mg Qd  -gabapentin 300 mg TID  Diabetes mellitus 2: Pt. Reports blood glucose has recently been in the 200s. Last A1C 6.6 yesterday. Stable serum glucose levels.  -Home medications   Bladder outlet obstruction:Denies dysuria or difficulty starting or stopping stream.  -Home tamsulosin 0.4 mg QD   This is a Careers information officer Note.  The care of the patient was discussed with Dr. Osborn Coho and the assessment and plan formulated with their assistance.  Please see their attached note for official documentation of the daily encounter.   LOS: 3 days    Jillyn Hidden, Medical Student 12/25/2016, 1:57 PM

## 2016-12-26 ENCOUNTER — Encounter (HOSPITAL_COMMUNITY): Payer: Self-pay | Admitting: Internal Medicine

## 2017-01-01 ENCOUNTER — Ambulatory Visit (HOSPITAL_BASED_OUTPATIENT_CLINIC_OR_DEPARTMENT_OTHER): Payer: PPO

## 2017-01-01 VITALS — BP 151/101 | HR 74 | Temp 98.0°F | Resp 17

## 2017-01-01 DIAGNOSIS — I10 Essential (primary) hypertension: Secondary | ICD-10-CM | POA: Diagnosis not present

## 2017-01-01 DIAGNOSIS — E349 Endocrine disorder, unspecified: Secondary | ICD-10-CM

## 2017-01-01 DIAGNOSIS — Z09 Encounter for follow-up examination after completed treatment for conditions other than malignant neoplasm: Secondary | ICD-10-CM | POA: Diagnosis not present

## 2017-01-01 DIAGNOSIS — J449 Chronic obstructive pulmonary disease, unspecified: Secondary | ICD-10-CM | POA: Diagnosis not present

## 2017-01-01 DIAGNOSIS — E291 Testicular hypofunction: Secondary | ICD-10-CM

## 2017-01-01 DIAGNOSIS — I639 Cerebral infarction, unspecified: Secondary | ICD-10-CM | POA: Diagnosis not present

## 2017-01-01 MED ORDER — TESTOSTERONE CYPIONATE 200 MG/ML IM SOLN
INTRAMUSCULAR | Status: AC
  Filled 2017-01-01: qty 2

## 2017-01-01 MED ORDER — TESTOSTERONE CYPIONATE 200 MG/ML IM SOLN
300.0000 mg | INTRAMUSCULAR | Status: DC
  Administered 2017-01-01: 300 mg via INTRAMUSCULAR

## 2017-01-01 NOTE — Patient Instructions (Signed)
Testosterone injection What is this medicine? TESTOSTERONE (tes TOS ter one) is the main male hormone. It supports normal male development such as muscle growth, facial hair, and deep voice. It is used in males to treat low testosterone levels. This medicine may be used for other purposes; ask your health care provider or pharmacist if you have questions. COMMON BRAND NAME(S): Andro-L.A., Aveed, Delatestryl, Depo-Testosterone, Virilon What should I tell my health care provider before I take this medicine? They need to know if you have any of these conditions: -cancer -diabetes -heart disease -kidney disease -liver disease -lung disease -prostate disease -an unusual or allergic reaction to testosterone, other medicines, foods, dyes, or preservatives -pregnant or trying to get pregnant -breast-feeding How should I use this medicine? This medicine is for injection into a muscle. It is usually given by a health care professional in a hospital or clinic setting. Contact your pediatrician regarding the use of this medicine in children. While this medicine may be prescribed for children as young as 12 years of age for selected conditions, precautions do apply. Overdosage: If you think you have taken too much of this medicine contact a poison control center or emergency room at once. NOTE: This medicine is only for you. Do not share this medicine with others. What if I miss a dose? Try not to miss a dose. Your doctor or health care professional will tell you when your next injection is due. Notify the office if you are unable to keep an appointment. What may interact with this medicine? -medicines for diabetes -medicines that treat or prevent blood clots like warfarin -oxyphenbutazone -propranolol -steroid medicines like prednisone or cortisone This list may not describe all possible interactions. Give your health care provider a list of all the medicines, herbs, non-prescription drugs, or  dietary supplements you use. Also tell them if you smoke, drink alcohol, or use illegal drugs. Some items may interact with your medicine. What should I watch for while using this medicine? Visit your doctor or health care professional for regular checks on your progress. They will need to check the level of testosterone in your blood. This medicine is only approved for use in men who have low levels of testosterone related to certain medical conditions. Heart attacks and strokes have been reported with the use of this medicine. Notify your doctor or health care professional and seek emergency treatment if you develop breathing problems; changes in vision; confusion; chest pain or chest tightness; sudden arm pain; severe, sudden headache; trouble speaking or understanding; sudden numbness or weakness of the face, arm or leg; loss of balance or coordination. Talk to your doctor about the risks and benefits of this medicine. This medicine may affect blood sugar levels. If you have diabetes, check with your doctor or health care professional before you change your diet or the dose of your diabetic medicine. Testosterone injections are not commonly used in women. Women should inform their doctor if they wish to become pregnant or think they might be pregnant. There is a potential for serious side effects to an unborn child. Talk to your health care professional or pharmacist for more information. Talk with your doctor or health care professional about your birth control options while taking this medicine. This drug is banned from use in athletes by most athletic organizations. What side effects may I notice from receiving this medicine? Side effects that you should report to your doctor or health care professional as soon as possible: -allergic reactions like skin rash,   itching or hives, swelling of the face, lips, or tongue -breast enlargement -breathing problems -changes in emotions or moods -deep or  hoarse voice -irregular menstrual periods -signs and symptoms of liver injury like dark yellow or brown urine; general ill feeling or flu-like symptoms; light-colored stools; loss of appetite; nausea; right upper belly pain; unusually weak or tired; yellowing of the eyes or skin -stomach pain -swelling of the ankles, feet, hands -too frequent or persistent erections -trouble passing urine or change in the amount of urine Side effects that usually do not require medical attention (report to your doctor or health care professional if they continue or are bothersome): -acne -change in sex drive or performance -facial hair growth -hair loss -headache This list may not describe all possible side effects. Call your doctor for medical advice about side effects. You may report side effects to FDA at 1-800-FDA-1088. Where should I keep my medicine? Keep out of the reach of children. This medicine can be abused. Keep your medicine in a safe place to protect it from theft. Do not share this medicine with anyone. Selling or giving away this medicine is dangerous and against the law. Store at room temperature between 20 and 25 degrees C (68 and 77 degrees F). Do not freeze. Protect from light. Follow the directions for the product you are prescribed. Throw away any unused medicine after the expiration date. NOTE: This sheet is a summary. It may not cover all possible information. If you have questions about this medicine, talk to your doctor, pharmacist, or health care provider.  2018 Elsevier/Gold Standard (2015-08-03 07:33:55)  

## 2017-01-05 DIAGNOSIS — J441 Chronic obstructive pulmonary disease with (acute) exacerbation: Secondary | ICD-10-CM | POA: Diagnosis not present

## 2017-01-10 DIAGNOSIS — I639 Cerebral infarction, unspecified: Secondary | ICD-10-CM

## 2017-01-10 HISTORY — DX: Cerebral infarction, unspecified: I63.9

## 2017-01-12 ENCOUNTER — Encounter: Payer: Self-pay | Admitting: Physical Therapy

## 2017-01-12 ENCOUNTER — Ambulatory Visit: Payer: PPO | Attending: Internal Medicine | Admitting: Occupational Therapy

## 2017-01-12 ENCOUNTER — Ambulatory Visit: Payer: PPO | Admitting: Physical Therapy

## 2017-01-12 ENCOUNTER — Ambulatory Visit (INDEPENDENT_AMBULATORY_CARE_PROVIDER_SITE_OTHER): Payer: PPO | Admitting: Orthopaedic Surgery

## 2017-01-12 ENCOUNTER — Ambulatory Visit: Payer: PPO | Admitting: *Deleted

## 2017-01-12 VITALS — BP 155/108

## 2017-01-12 DIAGNOSIS — I69354 Hemiplegia and hemiparesis following cerebral infarction affecting left non-dominant side: Secondary | ICD-10-CM | POA: Insufficient documentation

## 2017-01-12 NOTE — Therapy (Signed)
[facility] [address] [place], [place], [address] Phone: [phone]   Fax:  [phone]  Speech Language Pathology Evaluation  Evaluation not completed this date, due to concern for pt's increased blood pressure.  Pt/wife were encouraged to contact his physician, and rescheduled appointments for therapy evaluations at a later time.  Patient Details  Name: James Zuniga MRN: [id number] Date of Birth: [birth year] No Data Recorded  Encounter Date: 01/12/2017    Past Medical History:  Diagnosis Date  . CHF (congestive heart failure) ([place])   . Chronic airway obstruction ([place])   . Chronic pain   . Coronary artery disease   . Diabetes mellitus   . Disorder of liver   . Esophageal reflux   . Gout   . Hyperlipidemia   . Hypertension   . Hypokalemia   . Hypokalemia 11/2016  . Hypotestosteronism 07/01/2013  . Insomnia   . Iron deficiency anemia, unspecified 07/01/2013  . Memory loss   . PVD (peripheral vascular disease) ([place])   . Renal disorder   . Sleep apnea   . Vitamin D deficiency     Past Surgical History:  Procedure Laterality Date  . KNEE ARTHROSCOPY    . NO PAST SURGERIES    . TEE WITHOUT CARDIOVERSION N/A 12/24/2016   Procedure: TRANSESOPHAGEAL ECHOCARDIOGRAM (TEE);  Surgeon: Fay Records, MD;  Location: [facility] ENDOSCOPY;  Service: Cardiovascular;  Laterality: N/A;    Problem List Patient Active Problem List   Diagnosis Date Noted  . Left-sided weakness   . Chronic pain of left knee 12/22/2016  . Stroke ([place]) 12/22/2016  . Bradycardia 11/12/2016  . Generalized weakness 11/12/2016  . AKI (acute kidney injury) ([place]) 11/12/2016  . Hypophosphatemia 11/12/2016  . Leukocytosis 11/12/2016  . Diabetes mellitus with complication ([place]) 62/83/6629  . At risk for falling 09/05/2015  . Chronic respiratory failure ([place]) 07/22/2015  . COLD (chronic obstructive lung disease) ([place]) 11/12/2014  . Dyspnea and  respiratory abnormality 11/12/2014  . Lung nodule 04/02/2014  . Smoking 04/02/2014  . Hypokalemia 12/15/2013  . Diabetes mellitus, type 2 ([place]) 09/04/2013  . Essential (primary) hypertension 09/04/2013  . Absolute anemia 09/01/2013  . Chronic pain 08/16/2013  . CAFL (chronic airflow limitation) ([place]) 08/16/2013  . Chronic obstructive pulmonary disease ([place]) 08/16/2013  . Adiposity 08/11/2013  . Peripheral blood vessel disorder ([place]) 07/03/2013  . Cannot sleep 07/03/2013  . Peripheral vascular disease ([place]) 07/03/2013  . Iron deficiency anemia 07/01/2013  . Hypotestosteronism 07/01/2013  . 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 Compulsive tobacco user syndrome 06/01/2013  . Current tobacco use 06/01/2013  . Pulmonary nodule, right 27mm Aug 2014 03/25/2013  . Liver lesion 03/25/2013  . Abnormal magnetic resonance imaging study 03/21/2013  . Disease of liver 03/15/2013  . 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 Cancer screening 10/18/2012  . COPD (chronic obstructive pulmonary disease) ([place]) 08/11/2012  . Adverse reaction to bacterial vaccine 06/17/2012  . 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 Body mass index 27.0-27.9, adult 01/04/2012  . Emphysema 11/23/2011  . Chronic gout 11/23/2011  . Congestive heart failure ([place]) 11/23/2011  . Hypertension 11/23/2011  . Hyperlipidemia 11/23/2011  . Chronic bronchitis 11/23/2011  . Type 2 diabetes mellitus ([place]) 11/23/2011  . Chronic back pain 11/23/2011  . Osteoarthritis of left knee 11/23/2011  . Avitaminosis D 11/05/2011  . Gout 11/05/2011  . Cardiomyopathy, idiopathic ([place]) 11/04/2011  . Nondependent alcohol abuse 11/04/2011  . Amnesia 11/04/2011  . Acid reflux 11/04/2011  .  Arthropathia 11/04/2011  . Primary cardiomyopathy ([place]) 11/04/2011   Doil Kamara B. [place], MSP, CCC-SLP  Shonna Chock 01/12/2017, 4:26 PM  [facility] [address] [place], [place], [address] Phone: [phone]   Fax:  [phone]   Name: James Zuniga MRN: [id number] Date of Birth: 1955-09-09

## 2017-01-12 NOTE — Therapy (Signed)
[facility] [address] [place], [place], [address] Phone: [phone]   Fax:  [phone]  Physical Therapy Note Evaluation deferred due to elevated BP (see below)  Patient Details  Name: James Zuniga MRN: [id number] Date of Birth: [birth year] Referring Provider: Eldridge Abrahams NP  Encounter Date: 01/12/2017    Patient arrived for PT evaluation with BP LUE 152/102. Allowed pt to sit and rest while obtaining his history. Re-took BP LUE 155/108. Assessed BP in RUE 143/92. Patient reported he did take his BP medicine this morning. Wife reported his BP has been running high since discharge from the hospital. Educated patient/wife to call his primary care physician/NP to make them aware of his BP and that PT, OT, SLP evaluations had to be deferred due to elevated BP (DBP>100).   Patient and wife stated understanding and appreciated my assessment.  Past Medical History:  Diagnosis Date  . CHF (congestive heart failure) ([place])   . Chronic airway obstruction ([place])   . Chronic pain   . Coronary artery disease   . Diabetes mellitus   . Disorder of liver   . Esophageal reflux   . Gout   . Hyperlipidemia   . Hypertension   . Hypokalemia   . Hypokalemia 11/2016  . Hypotestosteronism 07/01/2013  . Insomnia   . Iron deficiency anemia, unspecified 07/01/2013  . Memory loss   . PVD (peripheral vascular disease) ([place])   . Renal disorder   . Sleep apnea   . Vitamin D deficiency     Past Surgical History:  Procedure Laterality Date  . KNEE ARTHROSCOPY    . NO PAST SURGERIES    . TEE WITHOUT CARDIOVERSION N/A 12/24/2016   Procedure: TRANSESOPHAGEAL ECHOCARDIOGRAM (TEE);  Surgeon: Fay Records, MD;  Location: [facility];  Service: Cardiovascular;  Laterality: N/A;    Vitals:   01/12/17 1021 01/12/17 1035  BP: (!) 152/105 (!) 155/108             [facility] PT Assessment - 01/12/17 1028      Assessment   Medical  Diagnosis R MCA CVA   Referring Provider Eldridge Abrahams NP     Balance Screen   Has the patient fallen in the past 6 months Yes   How many times? 2  when in heart failure and had no strength   Has the patient had a decrease in activity level because of a fear of falling?  No   Is the patient reluctant to leave their home because of a fear of falling?  No     Home Environment   Living Environment Private residence   Living Arrangements Spouse/significant other   Available Help at Discharge Family;Available PRN/intermittently  wife works nights   Type of Home Apartment   Home Access Level entry  7 steps to enter apartment he's moving to on 7/20   [place] - single point  oxygen-sleeps with CPAP; uses O2 sometimes during day; TENS    Additional Comments TENS for back; back brace     Prior Function   Level of Independence --  needed assist with shoes                                     Patient will benefit from skilled therapeutic intervention in order to improve the following deficits and impairments:  Visit Diagnosis: Hemiplegia and hemiparesis following cerebral infarction affecting left non-dominant side [facility])     Problem List Patient Active Problem List   Diagnosis Date Noted  . Left-sided weakness   . Chronic pain of left knee 12/22/2016  . Stroke ([place]) 12/22/2016  . Bradycardia 11/12/2016  . Generalized weakness 11/12/2016  . AKI (acute kidney injury) ([place]) 11/12/2016  . Hypophosphatemia 11/12/2016  . Leukocytosis 11/12/2016  . Diabetes mellitus with complication ([place]) 97/53/0051  . At risk for falling 09/05/2015  . Chronic respiratory failure ([place]) 07/22/2015  . COLD (chronic obstructive lung disease) ([place]) 11/12/2014  . Dyspnea and respiratory abnormality 11/12/2014  . Lung nodule 04/02/2014  . Smoking 04/02/2014  . Hypokalemia 12/15/2013  . Diabetes mellitus, type 2 ([place]) 09/04/2013  .  Essential (primary) hypertension 09/04/2013  . Absolute anemia 09/01/2013  . Chronic pain 08/16/2013  . CAFL (chronic airflow limitation) ([place]) 08/16/2013  . Chronic obstructive pulmonary disease ([place]) 08/16/2013  . Adiposity 08/11/2013  . Peripheral blood vessel disorder ([place]) 07/03/2013  . Cannot sleep 07/03/2013  . Peripheral vascular disease ([place]) 07/03/2013  . Iron deficiency anemia 07/01/2013  . Hypotestosteronism 07/01/2013  . 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 Compulsive tobacco user syndrome 06/01/2013  . Current tobacco use 06/01/2013  . Pulmonary nodule, right 36mm Aug 2014 03/25/2013  . Liver lesion 03/25/2013  . Abnormal magnetic resonance imaging study 03/21/2013  . Disease of liver 03/15/2013  . 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 Cancer screening 10/18/2012  . COPD (chronic obstructive pulmonary disease) ([place]) 08/11/2012  . Adverse reaction to bacterial vaccine 06/17/2012  . 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 Body mass index 27.0-27.9, adult 01/04/2012  . Emphysema 11/23/2011  . Chronic gout 11/23/2011  . Congestive heart failure ([place]) 11/23/2011  . Hypertension 11/23/2011  . Hyperlipidemia 11/23/2011  . Chronic bronchitis 11/23/2011  . Type 2 diabetes mellitus ([place]) 11/23/2011  . Chronic back pain 11/23/2011  . Osteoarthritis of left knee 11/23/2011  . Avitaminosis D 11/05/2011  . Gout 11/05/2011  . Cardiomyopathy, idiopathic ([place]) 11/04/2011  . Nondependent alcohol abuse 11/04/2011  . Amnesia 11/04/2011  . Acid reflux 11/04/2011  . Arthropathia 11/04/2011  . Primary cardiomyopathy ([place]) 11/04/2011    Rexanne Mano, PT 01/12/2017, 10:51 AM  [facility] [address] [place], [place], [address] Phone: [phone]   Fax:  [phone]  Name: NICKALAUS CROOKE MRN: [id number] Date of Birth: [birth year]

## 2017-01-14 DIAGNOSIS — I5022 Chronic systolic (congestive) heart failure: Secondary | ICD-10-CM | POA: Diagnosis not present

## 2017-01-14 DIAGNOSIS — Z8673 Personal history of transient ischemic attack (TIA), and cerebral infarction without residual deficits: Secondary | ICD-10-CM | POA: Insufficient documentation

## 2017-01-14 DIAGNOSIS — I10 Essential (primary) hypertension: Secondary | ICD-10-CM | POA: Diagnosis not present

## 2017-01-14 DIAGNOSIS — I428 Other cardiomyopathies: Secondary | ICD-10-CM | POA: Diagnosis not present

## 2017-01-14 DIAGNOSIS — J449 Chronic obstructive pulmonary disease, unspecified: Secondary | ICD-10-CM | POA: Diagnosis not present

## 2017-01-14 DIAGNOSIS — G4733 Obstructive sleep apnea (adult) (pediatric): Secondary | ICD-10-CM | POA: Diagnosis not present

## 2017-01-14 DIAGNOSIS — E785 Hyperlipidemia, unspecified: Secondary | ICD-10-CM | POA: Diagnosis not present

## 2017-01-14 DIAGNOSIS — I69354 Hemiplegia and hemiparesis following cerebral infarction affecting left non-dominant side: Secondary | ICD-10-CM | POA: Diagnosis not present

## 2017-01-15 ENCOUNTER — Ambulatory Visit: Payer: Self-pay

## 2017-01-15 DIAGNOSIS — R251 Tremor, unspecified: Secondary | ICD-10-CM | POA: Diagnosis not present

## 2017-01-15 DIAGNOSIS — I10 Essential (primary) hypertension: Secondary | ICD-10-CM | POA: Diagnosis not present

## 2017-01-18 DIAGNOSIS — R001 Bradycardia, unspecified: Secondary | ICD-10-CM | POA: Diagnosis not present

## 2017-01-18 DIAGNOSIS — Z8673 Personal history of transient ischemic attack (TIA), and cerebral infarction without residual deficits: Secondary | ICD-10-CM | POA: Diagnosis not present

## 2017-01-18 DIAGNOSIS — I428 Other cardiomyopathies: Secondary | ICD-10-CM | POA: Diagnosis not present

## 2017-01-19 ENCOUNTER — Other Ambulatory Visit: Payer: Self-pay | Admitting: Internal Medicine

## 2017-01-26 ENCOUNTER — Other Ambulatory Visit: Payer: Self-pay | Admitting: Internal Medicine

## 2017-01-28 ENCOUNTER — Other Ambulatory Visit: Payer: Self-pay | Admitting: Internal Medicine

## 2017-01-29 ENCOUNTER — Ambulatory Visit: Payer: Self-pay

## 2017-02-03 ENCOUNTER — Ambulatory Visit: Payer: PPO

## 2017-02-03 ENCOUNTER — Encounter: Payer: Self-pay | Admitting: Occupational Therapy

## 2017-02-03 ENCOUNTER — Ambulatory Visit: Payer: PPO | Admitting: Physical Therapy

## 2017-02-03 ENCOUNTER — Ambulatory Visit: Payer: PPO | Admitting: Occupational Therapy

## 2017-02-03 VITALS — BP 145/100 | HR 88

## 2017-02-03 VITALS — BP 153/102

## 2017-02-03 DIAGNOSIS — I69915 Cognitive social or emotional deficit following unspecified cerebrovascular disease: Secondary | ICD-10-CM

## 2017-02-03 DIAGNOSIS — I69354 Hemiplegia and hemiparesis following cerebral infarction affecting left non-dominant side: Secondary | ICD-10-CM | POA: Diagnosis not present

## 2017-02-03 DIAGNOSIS — R208 Other disturbances of skin sensation: Secondary | ICD-10-CM

## 2017-02-03 DIAGNOSIS — M6281 Muscle weakness (generalized): Secondary | ICD-10-CM

## 2017-02-03 DIAGNOSIS — R29898 Other symptoms and signs involving the musculoskeletal system: Secondary | ICD-10-CM

## 2017-02-03 DIAGNOSIS — R278 Other lack of coordination: Secondary | ICD-10-CM

## 2017-02-03 DIAGNOSIS — I69954 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: Secondary | ICD-10-CM

## 2017-02-03 DIAGNOSIS — R41841 Cognitive communication deficit: Secondary | ICD-10-CM

## 2017-02-03 DIAGNOSIS — R2689 Other abnormalities of gait and mobility: Secondary | ICD-10-CM

## 2017-02-03 NOTE — Therapy (Signed)
[facility] [address] [place], [place], [address] Phone: [phone]   Fax:  [phone]  Physical Therapy Evaluation  Patient Details  Name: James Zuniga MRN: [id number] Date of Birth: May 21, 1956 Referring Provider: Eldridge Abrahams NP  Encounter Date: 02/03/2017      PT End of Session - 02/03/17 1600    Visit Number 1   Number of Visits 9   Date for PT Re-Evaluation 04/04/17   Authorization Type [employer]   Authorization Time Period 02/03/17 TO 04/04/17   PT Start Time 0936   PT Stop Time 1010   PT Time Calculation (min) 34 min   Equipment Utilized During Treatment Gait belt   Activity Tolerance Patient limited by pain  due to knee buckle and  back jarred   Behavior During Therapy [facility] for tasks assessed/performed      Past Medical History:  Diagnosis Date  . CHF (congestive heart failure) ([place])   . Chronic airway obstruction ([place])   . Chronic pain   . Coronary artery disease   . Diabetes mellitus   . Disorder of liver   . Esophageal reflux   . Gout   . Hyperlipidemia   . Hypertension   . Hypokalemia   . Hypokalemia 11/2016  . Hypotestosteronism 07/01/2013  . Insomnia   . Iron deficiency anemia, unspecified 07/01/2013  . Memory loss   . PVD (peripheral vascular disease) ([place])   . Renal disorder   . Sleep apnea   . Vitamin D deficiency     Past Surgical History:  Procedure Laterality Date  . KNEE ARTHROSCOPY    . NO PAST SURGERIES    . TEE WITHOUT CARDIOVERSION N/A 12/24/2016   Procedure: TRANSESOPHAGEAL ECHOCARDIOGRAM (TEE);  Surgeon: Fay Records, MD;  Location: [facility] ENDOSCOPY;  Service: Cardiovascular;  Laterality: N/A;    Vitals:   02/03/17 0941 02/03/17 0956  BP: 138/89 (!) 145/100  Pulse: 80 88         Subjective Assessment - 02/03/17 1500    Subjective Patient and wife report he was not completely steady prior to CVA due to severe Lt knee arthritis (was planning to  have surgery, however MD has now delayed until he completes therapies). Pt/wife report he has noticed increased episodes of Lt knee buckling and afraid he will fall.    Patient is accompained by: Family member  wife   Pertinent History HTN, CHF EF 25-45%, DM, back pain (uses TENS), COPD, tremor LUE more than RUE   Limitations Walking   How long can you walk comfortably? inside store has to stop and rest    Patient Stated Goals be able to strengthen lt knee so he can have knee surgery    Currently in Pain? Yes   Pain Score 7    Pain Location Back   Pain Orientation Lower   Pain Descriptors / Indicators Aching   Pain Type Chronic pain   Pain Onset More than a month ago   Aggravating Factors  walking, standing            [facility] PT Assessment - 02/03/17 0939      Assessment   Medical Diagnosis R MCA CVA   Referring Provider Eldridge Abrahams NP   Onset Date/Surgical Date 12/22/16     Precautions   Precautions Fall     Balance Screen   Has the patient fallen in the past 6 months Yes   How many times? 2  when in heart  failure and very weak   Has the patient had a decrease in activity level because of a fear of falling?  No   Is the patient reluctant to leave their home because of a fear of falling?  No     Home Environment   Living Environment Private residence   Living Arrangements Spouse/significant other   Available Help at Discharge Family;Available PRN/intermittently  wife works nights   Type of [place] to enter  7 steps to enter apartment he's moving to on 7/20   Entrance Stairs-Number of Steps 16   Entrance Stairs-Rails Right;Left;Can reach both   [place] One level   [place] - single point  oxygen-sleeps with CPAP; uses O2 sometimes during day; TENS    Additional Comments TENS for back; back brace     Prior Function   Level of Independence Independent with community mobility with device;Independent with household mobility  without device  needed assist with shoes   Vocation Retired   Leisure walking, driving, TV,      Observation/Other Assessments   Focus on Therapeutic Outcomes (FOTO)  FS 52 (risk adjusted 48); LE 42.8     Sensation   Light Touch Appears Intact   Stereognosis Appears Intact     Coordination   Fine Motor Movements are Fluid and Coordinated No  RAM toe tapping degrades on Lt     ROM / Strength   AROM / PROM / Strength Strength     Strength   Overall Strength Deficits   Strength Assessment Site Hip;Knee;Ankle   Right/Left Hip Left;Right   Right Hip Flexion 5/5   Right Hip ABduction 4/5   Left Hip Flexion 4/5   Left Hip ABduction 4-/5   Right/Left Knee Right;Left   Right Knee Flexion 4/5   Right Knee Extension 5/5   Left Knee Flexion 3+/5   Left Knee Extension 3+/5   Right/Left Ankle Right;Left   Right Ankle Dorsiflexion 5/5   Left Ankle Dorsiflexion 5/5     Transfers   Transfers Sit to Stand;Stand to Sit   Sit to Stand 6: Modified independent (Device/Increase time);With upper extremity assist;With armrests  and cane   Stand to Sit 6: Modified independent (Device/Increase time);With upper extremity assist     Ambulation/Gait   Ambulation/Gait Yes   Ambulation/Gait Assistance 6: Modified independent (Device/Increase time)   Ambulation Distance (Feet) 50 Feet   Assistive device Straight cane   Gait Pattern Step-through pattern;Decreased step length - right;Decreased stance time - left;Decreased weight shift to left   Ambulation Surface Level     Standardized Balance Assessment   Standardized Balance Assessment Berg Balance Test;Timed Up and Go Test     Berg Balance Test   Sit to Stand Able to stand  independently using hands   Standing Unsupported Able to stand safely 2 minutes   Sitting with Back Unsupported but Feet Supported on Floor or Stool Able to sit safely and securely 2 minutes   Stand to Sit Controls descent by using hands   Transfers Able to transfer  safely, minor use of hands   From Standing, Reach Forward with Outstretched Arm --   From Standing Position, Turn to Look Behind Over each Shoulder Looks behind one side only/other side shows less weight shift   Turn 360 Degrees Able to turn 360 degrees safely but slowly   Standing Unsupported, Alternately Place Feet on Step/Stool Needs assistance to keep from falling or unable to try  due to lt knee buckling   Standing Unsupported, One Foot in Front Able to plae foot ahead of the other independently and hold 30 seconds   Standing on One Leg Tries to lift leg/unable to hold 3 seconds but remains standing independently   Berg comment: Patient with knee buckle/near fall when attempting alternating step touch with full WB on LLE; this caused incr back pain and further items deferred     Timed Up and Go Test   Normal TUG (seconds) 12.44            Objective measurements completed on examination: See above findings.                  PT Education - 02/03/17 1551    Education provided Yes   Education Details results of PT eval, PT POC; discussed use of his Lt knee brace (he reports he has 2) and to bring to his next appt   Person(s) Educated Patient;Spouse   Methods Explanation   Comprehension Verbalized understanding             PT Long Term Goals - 02/03/17 1624      PT LONG TERM GOAL #1   Title Patient will be independent with HEP for strengthening and balance.    Time 4   Period Weeks   Status New   Target Date 03/05/17     PT LONG TERM GOAL #2   Title Patient  will be able to verbalize plan to continue exercise/activity post-discharge from PT.    Time 4   Period Weeks   Status New   Target Date 03/05/17     PT LONG TERM GOAL #3   Title Patient will agree to orthotist consult to discuss possilble orthotics to reduce knee buckling and reduce fall risk.    Time 4   Period Weeks   Status New     PT LONG TERM GOAL #4   Title Patient will increase Berg  score by 3 points compared to baseline to indicate lesser fall risk.    Time 4   Period Weeks   Status New     PT LONG TERM GOAL #5   Title Patient can verbalize signs and symptoms of CVA and appropriate course of action.   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 These elements combined make him a fall risk (as displayed when attempting item of Berg Balance assessment with near fall). Patient remains hopeful that he can regain enough strength in his lt knee to be able to go forward with previously planned knee surgery. Patient will benefit from the interventions outlined below to address the deficits listed below.    History and Personal Factors relevant to plan of care: Relevant PMHx-severe Lt knee arthritis, back pain, CHF with EF documented anywhere from 25-45%, COPD; Personal factors-prior level of function (due to knee and back pain)   Clinical Presentation Stable   Clinical Presentation due to: no acute issues with other co-morbidities   Clinical Decision Making Low   Rehab Potential Good   Clinical Impairments Affecting Rehab Potential degree of back and knee pain   PT Frequency 2x / week   PT Duration 4 weeks   PT  Treatment/Interventions ADLs/Self Care Home Management;Functional mobility training;Stair training;Gait training;DME Instruction;Therapeutic activities;Therapeutic exercise;Balance training;Neuromuscular re-education;Patient/family education;Orthotic Fit/Training   PT Next Visit Plan if pt brought knee braces, determine if either are beneficial to wear; finish elements of Berg not completed on eval to get full Berg score; begin HEP to strengthen Lt knee (with noted incr arthritis) to prep for possible knee surgery and for balance (especially with narrow BOS), gait training with cane;   Recommended  Other Services Possible orthotist consult for Lt knee weakness   Consulted and Agree with Plan of Care Patient;Family member/caregiver   Family Member Consulted wife      Patient will benefit from skilled therapeutic intervention in order to improve the following deficits and impairments:  Abnormal gait, Cardiopulmonary status limiting activity, Decreased activity tolerance, Decreased balance, Decreased coordination, Decreased endurance, Decreased knowledge of use of DME, Decreased mobility, Decreased strength, Difficulty walking, Obesity, Pain  Visit Diagnosis: Hemiplegia and hemiparesis following cerebral infarction affecting left non-dominant side (HCC)  Muscle weakness (generalized)  Other abnormalities of gait and mobility  Other symptoms and signs involving the musculoskeletal system      G-Codes - February 23, 2017 1631    Functional Assessment Tool Used (Outpatient Only) TUG 12.44 sec; unable to complete Berg due to near fall during assessment (with back "jarred")   Functional Limitation Mobility: Walking and moving around   Mobility: Walking and Moving Around Current Status [phone]) At least 1 percent but less than 20 percent impaired, limited or restricted   Mobility: Walking and Moving Around Goal Status [phone]) At least 1 percent but less than 20 percent impaired, limited or restricted       Problem List Patient Active Problem List   Diagnosis Date Noted  . Left-sided weakness   . Chronic pain of left knee 12/22/2016  . Stroke ([place]) 12/22/2016  . Bradycardia 11/12/2016  . Generalized weakness 11/12/2016  . AKI (acute kidney injury) ([place]) 11/12/2016  . Hypophosphatemia 11/12/2016  . Leukocytosis 11/12/2016  . Diabetes mellitus with complication ([place]) 83/66/2947  . At risk for falling 09/05/2015  . Chronic respiratory failure ([place]) 07/22/2015  . COLD (chronic obstructive lung disease) ([place]) 11/12/2014  . Dyspnea and respiratory abnormality 11/12/2014  . Lung nodule  04/02/2014  . Smoking 04/02/2014  . Hypokalemia 12/15/2013  . Diabetes mellitus, type 2 ([place]) 09/04/2013  . Essential (primary) hypertension 09/04/2013  . Absolute anemia 09/01/2013  . Chronic pain 08/16/2013  . CAFL (chronic airflow limitation) ([place]) 08/16/2013  . Chronic obstructive pulmonary disease ([place]) 08/16/2013  . Adiposity 08/11/2013  . Peripheral blood vessel disorder ([place]) 07/03/2013  . Cannot sleep 07/03/2013  . Peripheral vascular disease ([place]) 07/03/2013  . Iron deficiency anemia 07/01/2013  . Hypotestosteronism 07/01/2013  . 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 Compulsive tobacco user syndrome 06/01/2013  . Current tobacco use 06/01/2013  . Pulmonary nodule, right 2mm Aug 2014 03/25/2013  . Liver lesion 03/25/2013  . Abnormal magnetic resonance imaging study 03/21/2013  . Disease of liver 03/15/2013  . 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 Cancer screening 10/18/2012  . COPD (chronic obstructive pulmonary disease) ([place]) 08/11/2012  . Adverse reaction to bacterial vaccine 06/17/2012  . 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 Body mass index 27.0-27.9, adult 01/04/2012  . Emphysema 11/23/2011  . Chronic gout 11/23/2011  . Congestive heart failure ([place]) 11/23/2011  . Hypertension 11/23/2011  . Hyperlipidemia 11/23/2011  . Chronic bronchitis 11/23/2011  . Type 2 diabetes mellitus ([place]) 11/23/2011  . Chronic back pain 11/23/2011  .  Osteoarthritis of left knee 11/23/2011  . Avitaminosis D 11/05/2011  . Gout 11/05/2011  . Cardiomyopathy, idiopathic ([place]) 11/04/2011  . Nondependent alcohol abuse 11/04/2011  . Amnesia 11/04/2011  . Acid reflux 11/04/2011  . Arthropathia 11/04/2011  . Primary cardiomyopathy ([place]) 11/04/2011    Rexanne Mano, PT 02/03/2017, 4:35 PM  [facility] [address] [place], [place], [address] Phone: [phone]   Fax:  [phone]  Name: James Zuniga MRN: [id number] Date of Birth: 1956-07-03

## 2017-02-03 NOTE — Therapy (Signed)
[facility] [address] [place], [place], [address] Phone: [phone]   Fax:  [phone]  Occupational Therapy Evaluation  Patient Details  Name: James Zuniga MRN: [id number] Date of Birth: [birth year] Referring Provider: Eldridge Abrahams  Encounter Date: 02/03/2017      OT End of Session - 02/03/17 1606    Visit Number 1   Number of Visits 9   Authorization Type Health Team $15 co pay, 0 visit limit   OT Start Time 0845   OT Stop Time 0930   OT Time Calculation (min) 45 min   Activity Tolerance Patient tolerated treatment well   Behavior During Therapy [facility] for tasks assessed/performed      Past Medical History:  Diagnosis Date  . CHF (congestive heart failure) ([place])   . Chronic airway obstruction ([place])   . Chronic pain   . Coronary artery disease   . Diabetes mellitus   . Disorder of liver   . Esophageal reflux   . Gout   . Hyperlipidemia   . Hypertension   . Hypokalemia   . Hypokalemia 11/2016  . Hypotestosteronism 07/01/2013  . Insomnia   . Iron deficiency anemia, unspecified 07/01/2013  . Memory loss   . PVD (peripheral vascular disease) ([place])   . Renal disorder   . Sleep apnea   . Vitamin D deficiency     Past Surgical History:  Procedure Laterality Date  . KNEE ARTHROSCOPY    . NO PAST SURGERIES    . TEE WITHOUT CARDIOVERSION N/A 12/24/2016   Procedure: TRANSESOPHAGEAL ECHOCARDIOGRAM (TEE);  Surgeon: Fay Records, MD;  Location: [facility];  Service: Cardiovascular;  Laterality: N/A;    Vitals:   02/03/17 0856  BP: (!) 153/102        Subjective Assessment - 02/03/17 0856    Subjective  My arm is weak, and my hand shakes sometime - get those better   Patient is accompained by: Family member  Murray Hodgkins   Currently in Pain? Yes   Pain Score 7    Pain Location Back   Pain Orientation Lower   Pain Descriptors / Indicators Aching   Pain Type Chronic pain           [facility] OT  Assessment - 02/03/17 0001      Assessment   Diagnosis cryptogenic stroke with left sided weakness   Referring Provider Eldridge Abrahams   Onset Date 12/22/16   Prior Therapy Acute hospitalization     Precautions   Precautions Fall   Precaution Comments history of back pain walks with a canre     Restrictions   Weight Bearing Restrictions No     Balance Screen   Has the patient fallen in the past 6 months No     Home  Environment   Family/patient expects to be discharged to: Private residence   Lives With Spouse     Prior Function   Level of Independence Independent with basic ADLs;Independent with community mobility with device;Independent with household mobility with device   Vocation Retired   Leisure walking, driving, TV,      ADL   Eating/Feeding Other (comment )  Increased time   Grooming Other (comment)  increased time   Lower Body Bathing Modified independent   Upper Body Dressing Increased time   Lower Body Dressing Modified independent   Toilet Transfer Modified independent   Toileting - Clothing Manipulation Modified independent   [place]  Tub/Shower Transfer Modified independent     IADL   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   Prior Level of Function Light Housekeeping independent   Light Housekeeping Performs light daily tasks such as dishwashing, bed making   Prior Level of Function Meal Prep performed only light simple meals prior   Meal Prep Able to complete simple cold meal and snack prep   Prior Level of Function Manufacturing engineer Relies on family or friends for transportation     Written Expression   Dominant Hand Right   Handwriting 90% legible     Vision - History   Baseline Vision Wears glasses only for reading     Vision Assessment   Eye Alignment Within Functional Limits     Activity Tolerance   Activity Tolerance  Comments Patient with COPD- uses O2 intermittently     Cognition   Overall Cognitive Status Impaired/Different from baseline   Area of Impairment Memory;Awareness   Memory Decreased short-term memory   Awareness Emergent   Attention Selective   Behaviors Poor frustration tolerance     Observation/Other Assessments   Focus on Therapeutic Outcomes (FOTO)  NeuroQOL UE 34.8     Sensation   Light Touch Impaired by gross assessment   Stereognosis Appears Intact   Proprioception Impaired by gross assessment     Coordination   Gross Motor Movements are Fluid and Coordinated No   Fine Motor Movements are Fluid and Coordinated No   Finger Nose Finger Test undershooting   9 Hole Peg Test Right;Left   Right 9 Hole Peg Test 29.78   Left 9 Hole Peg Test 37.22     Praxis   Praxis Not tested  Some difficulty following finger to nose testing     Tone   Assessment Location Left Upper Extremity     ROM / Strength   AROM / PROM / Strength Strength     Strength   Overall Strength Deficits   Strength Assessment Site Shoulder   Right/Left Shoulder Left   Left Shoulder Flexion 4-/5   Left Shoulder ABduction 4-/5     Hand Function   Right Hand Gross Grasp Functional   Right Hand Grip (lbs) 80   Right Hand Lateral Pinch 24 lbs   Left Hand Gross Grasp Impaired   Left Hand Grip (lbs) 55   Left Hand Lateral Pinch 22 lbs     LUE Tone   LUE Tone Mild;Hypertonic     LUE Tone   Hypertonic Details more evident proximally                         OT Education - 02/03/17 1605    Education provided Yes   Education Details reviewed results of OT eval and potential goal areas - discussed areas assessed to determine return to driving   Person(s) Educated Patient;Spouse   Methods Explanation   Comprehension Verbalized understanding             OT Long Term Goals - 02/03/17 1630      OT LONG TERM GOAL #1   Title Patient will complete a Home Exercise Program designed  to improve strength in left hand   Time 4   Period Weeks   Status New   Target Date 03/20/17     OT LONG TERM GOAL #2   Title Patient will complete a home exercise program designed to improve  coordination in left hand   Time 4   Period Weeks   Status New     OT LONG TERM GOAL #3   Title Patient will safely carry open drink across the room (25+ feet) with left hand, while walking with right hand on cane.     Time 4   Period Weeks   Status New     OT LONG TERM GOAL #4   Title Patient will demonstrate improved coordination for buttoning a dress shirt, as evidenced by reduced time for 9 hole peg test by 5 seconds.     Baseline 37.22 baseline - goal ~35 seconds   Time 4   Period Weeks   Status New     OT LONG TERM GOAL #5   Title Patient will demonstrate improved strength to aide with opening twist top bottles, etc, as evidenced by improved grip strength by 10 lbs.     Time 4   Period Weeks   Status New               Plan - 02-20-2017 1608    Clinical Impression Statement Patient is a 61 year old male with complicated medical history with recent cryptogenic stroke (12/22/2016) following prior admission to hospital for Congestive Heart Failure.  Patient presents to OT eval with left sided weakness, decreased coordination, decreased activity tolerance, decreased awareness of deficits, decreased alternating / divided attention, tremor in LUE, and longstanding pain, and endurance deficits.                       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  He recently moved to [place].  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     Occupational performance deficits (Please refer to evaluation for details): ADL's;IADL's;Rest and  Sleep;Leisure;Social Participation   Rehab Potential Good   OT Frequency 2x / week   OT Duration 4 weeks   OT Treatment/Interventions Self-care/ADL training;Therapeutic exercise;Functional Mobility Training;Patient/family education;Balance training;Neuromuscular education;Therapeutic activities;DME and/or AE instruction;Cognitive remediation/compensation;Visual/perceptual remediation/compensation   Plan Review long term goals with patient, Initiate coord HEP, monitor BP   Consulted and Agree with Plan of Care Patient;Family member/caregiver      Patient will benefit from skilled therapeutic intervention in order to improve the following deficits and impairments:  Cardiopulmonary status limiting activity, Decreased activity tolerance, Decreased balance, Decreased cognition, Decreased coordination, Decreased safety awareness, Decreased mobility, Decreased knowledge of precautions, Decreased endurance, Decreased strength, Difficulty walking, Increased edema, Impaired UE functional use, Impaired tone, Impaired sensation, Impaired flexibility, Impaired perceived functional ability, Impaired vision/preception, Pain  Visit Diagnosis: Other lack of coordination  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 (HCC)  Muscle weakness (generalized)  Cognitive social or emotional deficit following unspecified cerebrovascular disease  Other disturbances of skin sensation      G-Codes - Feb 20, 2017 1633    Functional Assessment Tool Used (Outpatient only) 9 hole peg test, skilled clinical observation   Functional Limitation Carrying, moving and handling objects   Carrying, Moving and Handling Objects Current Status (J0093) At least 40 percent but less than 60 percent impaired, limited or restricted   Carrying, Moving and Handling Objects Goal Status (G1829) At least 20 percent but less than 40 percent impaired, limited or restricted  Problem List Patient Active Problem List   Diagnosis Date Noted  . Left-sided weakness   . Chronic pain of left knee 12/22/2016  . Stroke ([place]) 12/22/2016  . Bradycardia 11/12/2016  . Generalized weakness 11/12/2016  . AKI (acute kidney injury) ([place]) 11/12/2016  . Hypophosphatemia 11/12/2016  . Leukocytosis 11/12/2016  . Diabetes mellitus with complication ([place]) 00/51/1021  . At risk for falling 09/05/2015  . Chronic respiratory failure ([place]) 07/22/2015  . COLD (chronic obstructive lung disease) ([place]) 11/12/2014  . Dyspnea and respiratory abnormality 11/12/2014  . Lung nodule 04/02/2014  . Smoking 04/02/2014  . Hypokalemia 12/15/2013  . Diabetes mellitus, type 2 ([place]) 09/04/2013  . Essential (primary) hypertension 09/04/2013  . Absolute anemia 09/01/2013  . Chronic pain 08/16/2013  . CAFL (chronic airflow limitation) ([place]) 08/16/2013  . Chronic obstructive pulmonary disease ([place]) 08/16/2013  . Adiposity 08/11/2013  . Peripheral blood vessel disorder ([place]) 07/03/2013  . Cannot sleep 07/03/2013  . Peripheral vascular disease ([place]) 07/03/2013  . Iron deficiency anemia 07/01/2013  . Hypotestosteronism 07/01/2013  . 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 Compulsive tobacco user syndrome 06/01/2013  . Current tobacco use 06/01/2013  . Pulmonary nodule, right 67mm Aug 2014 03/25/2013  . Liver lesion 03/25/2013  . Abnormal magnetic resonance imaging study 03/21/2013  . Disease of liver 03/15/2013  . 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 Cancer screening 10/18/2012  . COPD (chronic obstructive pulmonary disease) ([place]) 08/11/2012  . Adverse reaction to bacterial vaccine 06/17/2012  . 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 Body mass index 27.0-27.9, adult 01/04/2012  . Emphysema 11/23/2011  . Chronic gout 11/23/2011  . Congestive heart failure ([place]) 11/23/2011  . Hypertension 11/23/2011  . Hyperlipidemia 11/23/2011  . Chronic bronchitis 11/23/2011  .  Type 2 diabetes mellitus ([place]) 11/23/2011  . Chronic back pain 11/23/2011  . Osteoarthritis of left knee 11/23/2011  . Avitaminosis D 11/05/2011  . Gout 11/05/2011  . Cardiomyopathy, idiopathic ([place]) 11/04/2011  . Nondependent alcohol abuse 11/04/2011  . Amnesia 11/04/2011  . Acid reflux 11/04/2011  . Arthropathia 11/04/2011  . Primary cardiomyopathy ([place]) 11/04/2011    Mariah Milling, OTR/L 02/03/2017, 4:35 PM  [facility] [address] [place], [place], [address] Phone: [phone]   Fax:  [phone]  Name: CHIRSTOPHER IOVINO MRN: [id number] Date of Birth: 12/20/1955

## 2017-02-03 NOTE — Therapy (Signed)
[facility] [address] [place], [place], [address] Phone: [phone]   Fax:  [phone]  Speech Language Pathology Evaluation  Patient Details  Name: James Zuniga MRN: [id number] Date of Birth: [birth year] Referring Provider: Eldridge Abrahams, NP  Encounter Date: 02/03/2017      End of Session - 02/04/17 1640    Visit Number 1   Number of Visits 17   Date for SLP Re-Evaluation 04/09/17   SLP Start Time 1638   SLP Stop Time  1102   SLP Time Calculation (min) 44 min   Activity Tolerance Patient tolerated treatment well  due to knee buckle and  back jarred      Past Medical History:  Diagnosis Date  . CHF (congestive heart failure) ([place])   . Chronic airway obstruction ([place])   . Chronic pain   . Coronary artery disease   . Diabetes mellitus   . Disorder of liver   . Esophageal reflux   . Gout   . Hyperlipidemia   . Hypertension   . Hypokalemia   . Hypokalemia 11/2016  . Hypotestosteronism 07/01/2013  . Insomnia   . Iron deficiency anemia, unspecified 07/01/2013  . Memory loss   . PVD (peripheral vascular disease) ([place])   . Renal disorder   . Sleep apnea   . Vitamin D deficiency     Past Surgical History:  Procedure Laterality Date  . KNEE ARTHROSCOPY    . NO PAST SURGERIES    . TEE WITHOUT CARDIOVERSION N/A 12/24/2016   Procedure: TRANSESOPHAGEAL ECHOCARDIOGRAM (TEE);  Surgeon: Fay Records, MD;  Location: [facility];  Service: Cardiovascular;  Laterality: N/A;    There were no vitals filed for this visit.      Subjective Assessment - 02/03/17 1024    Subjective Pt looked to wife to tell date of CVA. Pt relates arm strength as deficit after CVA. Pt does not endorse balance as difference after CVA but almost fell shaking SLP's hand.   Patient is accompained by: --  wife   Currently in Pain? Yes   Pain Score 7    Pain Location Back   Pain Orientation Lower            SLP Evaluation  [facility] - 02/04/17 0001      SLP Visit Information   SLP Received On 02/03/17   Referring Provider Eldridge Abrahams, NP   Onset Date December 22, 2016   Medical Diagnosis CVA     Prior Functional Status   Cognitive/Linguistic Baseline Within functional limits   Type of Home Apartment    Lives With Spouse   Vocation On disability  Hurt back 25 years ago     Cognition   Overall Cognitive Status Impaired/Different from baseline   Area of Impairment Memory;Problem solving   Memory Decreased short-term memory   Memory Comments Did not recall wife birthday, what therapies he had today, recall 3/5 words after 5 minutes   Problem Solving Difficulty sequencing   Problem Solving Comments Unsure how to modify trailmaking task, cube to fix appropriately   Attention Selective   Selective Attention --  ID'd 11/11 letters with talk on radio; delay processing x3   Awareness Impaired   Awareness Impairment Intellectual impairment  did not tell cog-ling deficts but "These things are hard!"   Problem Solving --   Behaviors Poor frustration tolerance     Auditory Comprehension   Overall Auditory Comprehension Appears within functional limits for tasks assessed  Verbal Expression   Overall Verbal Expression Appears within functional limits for tasks assessed     Oral Motor/Sensory Function   Overall Oral Motor/Sensory Function Appears within functional limits for tasks assessed     Motor Speech   Overall Motor Speech Appears within functional limits for tasks assessed     Standardized Assessments   Standardized Assessments  Montreal Cognitive Assessment (MOCA)   Montreal Cognitive Assessment (MOCA)  18/30, 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 "You said 11:!5?" SLP cued pt for 11:10 arrangement of hands.                    SLP Education - 02/04/17 1639    Education provided Yes   Education Details discussed results of cog-com testing, possible goals for ST   Person(s) Educated Patient;Spouse   Methods Explanation   Comprehension Verbalized understanding;Need further instruction          SLP Short Term Goals - 02/04/17 1557      SLP SHORT TERM GOAL #1   Title pt will demo emergent awareness 80% of the time with simple cognitive linguistic tasks using modified indpendence (rechecks) x3 sessions   Time 4   Period Weeks   Status New     SLP SHORT TERM GOAL #2   Title pt will double check (recheck) his work with rare min A to do so x3 sessions   Time 4   Period Weeks   Status New     SLP SHORT TERM GOAL #3   Title  pt will achieve 80% success with simple tasks involving time, money, calendar, etc. with rare min A over 3 sessions   Time 4   Period Weeks   Status New     SLP SHORT TERM GOAL #4   Title pt will tell SLP 4 memory strategies with rare min A x3 sessions   Time 4   Period Weeks   Status New          SLP Long Term Goals - 02/04/17 1607      SLP LONG TERM GOAL #1   Title pt will demo anticipatory awareness with mod complex cognitive linguistic tasks by using modified indpendence (rechecks) x3 sessions   Time 8   Period Weeks   Status New     SLP LONG TERM GOAL #2   Title pt will use a memory strategy in 4 therapy sessions   Time 8   Period Weeks   Status New     SLP LONG TERM GOAL #3   Title pt will demo [facility] organziational skills for mod complex cognitive-linguistic tasks with rare min A x 3 sessions   Time 8   Period Weeks   Status Revised     SLP LONG TERM GOAL #4   Title pt will provide attention to detail adequate for finding errors in mod complex cognitive linguistic tasks with rare questioning cues x3 sessions   Time 8    Period Weeks   Status New          Plan - 02/04/17 1641    Clinical Impression Statement Pt presents today with mod cognitive communication deficits following a CVA in  June 2018, including but not limited to decr'd awareness, memory skills, problem solving and organization, awareness, and attention/attention to detail. Pt would benefit from skilled ST to improve his cognitive communication skills to return to as close to PLOF as possible.   Speech Therapy Frequency 2x / week   Duration --  8 weeks/17 visits   Treatment/Interventions Compensatory techniques;Cognitive reorganization;Environmental controls;Internal/external aids;SLP instruction and feedback;Functional tasks;Cueing hierarchy;Patient/family education   Potential to Achieve Goals Good   Potential Considerations Severity of impairments   Consulted and Agree with Plan of Care Patient      Patient will benefit from skilled therapeutic intervention in order to improve the following deficits and impairments:   Cognitive communication deficit - Plan: SLP plan of care cert/re-cert      G-Codes - 82/99/37 1643    Functional Assessment Tool Used noms   Functional Limitations Memory   Memory Current Status [phone]) At least 40 percent but less than 60 percent impaired, limited or restricted   Memory Goal Status (E9381) At least 20 percent but less than 40 percent impaired, limited or restricted      Problem List Patient Active Problem List   Diagnosis Date Noted  . Left-sided weakness   . Chronic pain of left knee 12/22/2016  . Stroke ([place]) 12/22/2016  . Bradycardia 11/12/2016  . Generalized weakness 11/12/2016  . AKI (acute kidney injury) ([place]) 11/12/2016  . Hypophosphatemia 11/12/2016  . Leukocytosis 11/12/2016  . Diabetes mellitus with complication ([place]) 01/75/1025  . At risk for falling 09/05/2015  . Chronic respiratory failure ([place]) 07/22/2015  . COLD (chronic obstructive lung disease) ([place]) 11/12/2014  . Dyspnea  and respiratory abnormality 11/12/2014  . Lung nodule 04/02/2014  . Smoking 04/02/2014  . Hypokalemia 12/15/2013  . Diabetes mellitus, type 2 ([place]) 09/04/2013  . Essential (primary) hypertension 09/04/2013  . Absolute anemia 09/01/2013  . Chronic pain 08/16/2013  . CAFL (chronic airflow limitation) ([place]) 08/16/2013  . Chronic obstructive pulmonary disease ([place]) 08/16/2013  . Adiposity 08/11/2013  . Peripheral blood vessel disorder ([place]) 07/03/2013  . Cannot sleep 07/03/2013  . Peripheral vascular disease ([place]) 07/03/2013  . Iron deficiency anemia 07/01/2013  . Hypotestosteronism 07/01/2013  . 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 Compulsive tobacco user syndrome 06/01/2013  . Current tobacco use 06/01/2013  . Pulmonary nodule, right 86mm Aug 2014 03/25/2013  . Liver lesion 03/25/2013  . Abnormal magnetic resonance imaging study 03/21/2013  . Disease of liver 03/15/2013  . 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 Cancer screening 10/18/2012  . COPD (chronic obstructive pulmonary disease) ([place]) 08/11/2012  . Adverse reaction to bacterial vaccine 06/17/2012  . 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 Body mass index 27.0-27.9, adult 01/04/2012  . Emphysema 11/23/2011  . Chronic gout 11/23/2011  . Congestive heart failure ([place]) 11/23/2011  . Hypertension 11/23/2011  . Hyperlipidemia 11/23/2011  . Chronic bronchitis 11/23/2011  . Type 2 diabetes mellitus ([place]) 11/23/2011  . Chronic back pain 11/23/2011  . Osteoarthritis of left knee 11/23/2011  . Avitaminosis D 11/05/2011  . Gout 11/05/2011  . Cardiomyopathy, idiopathic ([place]) 11/04/2011  . Nondependent alcohol abuse 11/04/2011  . Amnesia 11/04/2011  . Acid reflux 11/04/2011  . Arthropathia 11/04/2011  . Primary cardiomyopathy ([place]) 11/04/2011    [facility] ,[place], [place]  02/04/2017, 4:47 PM  [facility] [address] [place], [place],  [address] Phone: [phone]   Fax:  [phone]  Name: James Zuniga MRN: [id number] Date of Birth: 09-19-1955

## 2017-02-04 DIAGNOSIS — J441 Chronic obstructive pulmonary disease with (acute) exacerbation: Secondary | ICD-10-CM | POA: Diagnosis not present

## 2017-02-08 ENCOUNTER — Encounter: Payer: Self-pay | Admitting: Occupational Therapy

## 2017-02-08 ENCOUNTER — Ambulatory Visit: Payer: PPO | Admitting: Speech Pathology

## 2017-02-08 ENCOUNTER — Ambulatory Visit: Payer: PPO | Admitting: Occupational Therapy

## 2017-02-08 DIAGNOSIS — R278 Other lack of coordination: Secondary | ICD-10-CM

## 2017-02-08 DIAGNOSIS — I69954 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: Secondary | ICD-10-CM

## 2017-02-08 DIAGNOSIS — I69354 Hemiplegia and hemiparesis following cerebral infarction affecting left non-dominant side: Secondary | ICD-10-CM | POA: Diagnosis not present

## 2017-02-08 DIAGNOSIS — R208 Other disturbances of skin sensation: Secondary | ICD-10-CM

## 2017-02-08 DIAGNOSIS — R41841 Cognitive communication deficit: Secondary | ICD-10-CM

## 2017-02-08 DIAGNOSIS — I69915 Cognitive social or emotional deficit following unspecified cerebrovascular disease: Secondary | ICD-10-CM

## 2017-02-08 DIAGNOSIS — M6281 Muscle weakness (generalized): Secondary | ICD-10-CM

## 2017-02-08 NOTE — Therapy (Signed)
[facility] [address] [place], [place], [address] Phone: [phone]   Fax:  [phone]  Occupational Therapy Treatment  Patient Details  Name: James Zuniga MRN: [id number] Date of Birth: December 12, 1955 Referring Provider: Eldridge Abrahams  Encounter Date: 02/08/2017      OT End of Session - 02/08/17 0034    Visit Number 2   Number of Visits 9   Authorization Type Health Team $15 co pay, 0 visit limit   OT Start Time 0932   OT Stop Time 1015   OT Time Calculation (min) 43 min   Activity Tolerance Patient tolerated treatment well      Past Medical History:  Diagnosis Date  . CHF (congestive heart failure) ([place])   . Chronic airway obstruction ([place])   . Chronic pain   . Coronary artery disease   . Diabetes mellitus   . Disorder of liver   . Esophageal reflux   . Gout   . Hyperlipidemia   . Hypertension   . Hypokalemia   . Hypokalemia 11/2016  . Hypotestosteronism 07/01/2013  . Insomnia   . Iron deficiency anemia, unspecified 07/01/2013  . Memory loss   . PVD (peripheral vascular disease) ([place])   . Renal disorder   . Sleep apnea   . Vitamin D deficiency     Past Surgical History:  Procedure Laterality Date  . KNEE ARTHROSCOPY    . NO PAST SURGERIES    . TEE WITHOUT CARDIOVERSION N/A 12/24/2016   Procedure: TRANSESOPHAGEAL ECHOCARDIOGRAM (TEE);  Surgeon: Fay Records, MD;  Location: [facility];  Service: Cardiovascular;  Laterality: N/A;    There were no vitals filed for this visit.      Subjective Assessment - 02/08/17 0933    Subjective  My left hand is getting better   Pertinent History see epic pt with R CVA   Currently in Pain? Yes   Pain Score 7    Pain Location Back   Pain Orientation Lower   Pain Descriptors / Indicators Aching   Pain Type Chronic pain  27 years   Pain Onset More than a month ago   Pain Frequency Constant   Aggravating Factors  rain   Pain Relieving Factors back  brace   Multiple Pain Sites Yes   Pain Score 6   Pain Location Knee   Pain Orientation Right;Left   Pain Descriptors / Indicators Aching   Pain Type Chronic pain   Pain Frequency Intermittent   Aggravating Factors  walking too much - I was supposed to have surgery but then this happened.    Pain Relieving Factors knee braces                      OT Treatments/Exercises (OP) - 02/08/17 0001      ADLs   ADL Comments Reviewed goals and pt is agreement with goals. Pt issued written copy     Exercises   Exercises Hand     Hand Exercises   Theraputty - Grip Pt issued green putty and instructed in exercies for grip strength - pt initially required max cues and practice however was eventually able to return demostrate without cues.     Fine Motor Coordination   Other Fine Motor Exercises Pt issued and instructed in HEP for coordination with LUE.  Pt required cues, increased time and practice before able to return demonstrate.  OT Education - 02/08/17 1237    Education provided Yes   Education Details goals of OT, HEP for grip strength and coordination   Person(s) Educated Patient   Methods Explanation;Demonstration;Verbal cues;Handout   Comprehension Verbalized understanding;Returned demonstration             OT Long Term Goals - 02/08/17 1238      OT LONG TERM GOAL #1   Title Patient will complete a Home Exercise Program designed to improve strength in left hand   Time 4   Period Weeks   Status On-going     OT LONG TERM GOAL #2   Title Patient will complete a home exercise program designed to improve coordination in left hand   Time 4   Period Weeks   Status On-going     OT LONG TERM GOAL #3   Title Patient will safely carry open drink across the room (25+ feet) with left hand, while walking with right hand on cane.     Time 4   Period Weeks   Status On-going     OT LONG TERM GOAL #4   Title Patient will demonstrate  improved coordination for buttoning a dress shirt, as evidenced by reduced time for 9 hole peg test by 5 seconds.     Baseline 37.22 baseline - goal ~35 seconds   Time 4   Period Weeks   Status On-going     OT LONG TERM GOAL #5   Title Patient will demonstrate improved strength to aide with opening twist top bottles, etc, as evidenced by improved grip strength by 10 lbs.     Time 4   Period Weeks   Status On-going               Plan - 02/08/17 1239    Clinical Impression Statement Pt in agreement with OT goals and POC.  Pt progressing toward goals.    Rehab Potential Good   OT Frequency 2x / week   OT Duration 4 weeks   OT Treatment/Interventions Self-care/ADL training;Therapeutic exercise;Functional Mobility Training;Patient/family education;Balance training;Neuromuscular education;Therapeutic activities;DME and/or AE instruction;Cognitive remediation/compensation;Visual/perceptual remediation/compensation   Plan check on HEP, work on grip strength, coordination and functional use of the LUE   Consulted and Agree with Plan of Care Patient      Patient will benefit from skilled therapeutic intervention in order to improve the following deficits and impairments:  Cardiopulmonary status limiting activity, Decreased activity tolerance, Decreased balance, Decreased cognition, Decreased coordination, Decreased safety awareness, Decreased mobility, Decreased knowledge of precautions, Decreased endurance, Decreased strength, Difficulty walking, Increased edema, Impaired UE functional use, Impaired tone, Impaired sensation, Impaired flexibility, Impaired perceived functional ability, Impaired vision/preception, Pain  Visit Diagnosis: 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 Left-sided weakness   . Chronic pain of left knee 12/22/2016  . Stroke ([place]) 12/22/2016  . Bradycardia 11/12/2016  . Generalized weakness 11/12/2016  . AKI (acute kidney injury) ([place]) 11/12/2016  . Hypophosphatemia 11/12/2016  . Leukocytosis 11/12/2016  . Diabetes mellitus with complication ([place]) 70/62/3762  . At risk for falling 09/05/2015  . Chronic respiratory failure ([place]) 07/22/2015  . COLD (chronic obstructive lung disease) ([place]) 11/12/2014  . Dyspnea and respiratory abnormality 11/12/2014  . Lung nodule  04/02/2014  . Smoking 04/02/2014  . Hypokalemia 12/15/2013  . Diabetes mellitus, type 2 ([place]) 09/04/2013  . Essential (primary) hypertension 09/04/2013  . Absolute anemia 09/01/2013  . Chronic pain 08/16/2013  . CAFL (chronic airflow limitation) ([place]) 08/16/2013  . Chronic obstructive pulmonary disease ([place]) 08/16/2013  . Adiposity 08/11/2013  . Peripheral blood vessel disorder ([place]) 07/03/2013  . Cannot sleep 07/03/2013  . Peripheral vascular disease ([place]) 07/03/2013  . Iron deficiency anemia 07/01/2013  . Hypotestosteronism 07/01/2013  . 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 Compulsive tobacco user syndrome 06/01/2013  . Current tobacco use 06/01/2013  . Pulmonary nodule, right 69mm Aug 2014 03/25/2013  . Liver lesion 03/25/2013  . Abnormal magnetic resonance imaging study 03/21/2013  . Disease of liver 03/15/2013  . 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 Cancer screening 10/18/2012  . COPD (chronic obstructive pulmonary disease) ([place]) 08/11/2012  . Adverse reaction to bacterial vaccine 06/17/2012  . 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 Body mass index 27.0-27.9, adult 01/04/2012  . Emphysema 11/23/2011  . Chronic gout 11/23/2011  . Congestive heart failure ([place]) 11/23/2011  . Hypertension 11/23/2011  . Hyperlipidemia  11/23/2011  . Chronic bronchitis 11/23/2011  . Type 2 diabetes mellitus ([place]) 11/23/2011  . Chronic back pain 11/23/2011  . Osteoarthritis of left knee 11/23/2011  . Avitaminosis D 11/05/2011  . Gout 11/05/2011  . Cardiomyopathy, idiopathic ([place]) 11/04/2011  . Nondependent alcohol abuse 11/04/2011  . Amnesia 11/04/2011  . Acid reflux 11/04/2011  . Arthropathia 11/04/2011  . Primary cardiomyopathy ([place]) 11/04/2011    Quay Burow, OTR/L 02/08/2017, 12:42 PM  [facility] [address] [place], [place], [address] Phone: [phone]   Fax:  [phone]  Name: James Zuniga MRN: [id number] Date of Birth: 02-26-1956

## 2017-02-08 NOTE — Therapy (Signed)
[facility] [address] [place], [place], [address] Phone: [phone]   Fax:  [phone]  Speech Language Pathology Treatment  Patient Details  Name: James Zuniga MRN: [id number] Date of Birth: January 08, 1956 Referring Provider: Eldridge Abrahams, NP  Encounter Date: 02/08/2017      End of Session - 02/08/17 1311    Visit Number 2   Number of Visits 17   Date for SLP Re-Evaluation 04/09/17   SLP Start Time 0846   SLP Stop Time  0930   SLP Time Calculation (min) 44 min   Activity Tolerance Patient tolerated treatment well      Past Medical History:  Diagnosis Date  . CHF (congestive heart failure) ([place])   . Chronic airway obstruction ([place])   . Chronic pain   . Coronary artery disease   . Diabetes mellitus   . Disorder of liver   . Esophageal reflux   . Gout   . Hyperlipidemia   . Hypertension   . Hypokalemia   . Hypokalemia 11/2016  . Hypotestosteronism 07/01/2013  . Insomnia   . Iron deficiency anemia, unspecified 07/01/2013  . Memory loss   . PVD (peripheral vascular disease) ([place])   . Renal disorder   . Sleep apnea   . Vitamin D deficiency     Past Surgical History:  Procedure Laterality Date  . KNEE ARTHROSCOPY    . NO PAST SURGERIES    . TEE WITHOUT CARDIOVERSION N/A 12/24/2016   Procedure: TRANSESOPHAGEAL ECHOCARDIOGRAM (TEE);  Surgeon: Fay Records, MD;  Location: [facility];  Service: Cardiovascular;  Laterality: N/A;    There were no vitals filed for this visit.      Subjective Assessment - 02/08/17 0847    Subjective "My memory ain't as good as it used to be. I would like to drive but I know I gotta wait for a while."   Currently in Pain? Yes   Pain Score 7    Pain Location Back   Pain Orientation Lower   Pain Descriptors / Indicators Aching   Pain Type Chronic pain   Pain Onset More than a month ago   Aggravating Factors  rain   Pain Relieving Factors back brace                ADULT SLP TREATMENT - 02/08/17 0846      General Information   Behavior/Cognition Alert;Cooperative   Patient Positioning Upright in chair   Oral care provided N/A     Treatment Provided   Treatment provided Cognitive-Linquistic     Cognitive-Linquistic Treatment   Treatment focused on Cognition   Skilled Treatment SLP reviewed results of previous evaluation, provided education re: deficit areas and functional impacts, particularly avoiding driving at this time as pt expressed desire to drive again. Reviewed plan of care and goals. Provided education re: strategies for functional recall, written and verbal instruction in calendar use. Pt recorded current dates, upcoming appointments and reminders with  consistent min-mod A. Introduced [employer] manager ([facility]).     Assessment / Recommendations / Plan   Plan Continue with current plan of care     Progression Toward Goals   Progression toward goals Progressing toward goals          SLP Education - 02/08/17 1310    Education provided Yes   Education Details deficit areas, functional impacts of cognitive deficits on ability to drive   Person(s) Educated Patient   Methods Explanation;Verbal cues   Comprehension  Verbalized understanding;Need further instruction          SLP Short Term Goals - 02/08/17 0911      SLP SHORT TERM GOAL #1   Title pt will demo emergent awareness 80% of the time with simple cognitive linguistic tasks using modified indpendence (rechecks) x3 sessions   Time 4   Period Weeks   Status On-going     SLP SHORT TERM GOAL #2   Title pt will double check (recheck) his work with rare min A to do so x3 sessions   Time 4   Period Weeks   Status On-going     SLP SHORT TERM GOAL #3   Title  pt will achieve 80% success with simple tasks involving time, money, calendar, etc. with rare min A over 3 sessions   Time 4   Period Weeks   Status On-going     SLP SHORT TERM GOAL #4   Title pt  will tell SLP 4 memory strategies with rare min A x3 sessions   Time 4   Period Weeks   Status On-going          SLP Long Term Goals - 02/08/17 1312      SLP LONG TERM GOAL #1   Title pt will demo anticipatory awareness with mod complex cognitive linguistic tasks by using modified indpendence (rechecks) x3 sessions   Time 8   Period Weeks   Status On-going     SLP LONG TERM GOAL #2   Title pt will use a memory strategy in 4 therapy sessions   Time 8   Period Weeks   Status On-going     SLP LONG TERM GOAL #3   Title pt will demo [facility] organziational skills for mod complex cognitive-linguistic tasks with rare min A x 3 sessions   Time 8   Period Weeks   Status On-going     SLP LONG TERM GOAL #4   Title pt will provide attention to detail adequate for finding errors in mod complex cognitive linguistic tasks with rare questioning cues x3 sessions   Time 8   Period Weeks   Status On-going          Plan - 02/08/17 1311    Clinical Impression Statement Pt presents today with mod 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 Pt would benefit from skilled ST to improve his cognitive communication skills to return to as close to PLOF as possible.   Speech Therapy Frequency 2x / week   Treatment/Interventions Compensatory techniques;Cognitive reorganization;Environmental controls;Internal/external aids;SLP instruction and feedback;Functional tasks;Cueing hierarchy;Patient/family education   Potential to Achieve Goals Good   Potential Considerations Severity of impairments   Consulted and Agree with Plan of Care Patient      Patient will benefit from skilled therapeutic intervention in order to improve the following deficits and impairments:   Cognitive communication deficit    Problem List Patient Active Problem List    Diagnosis Date Noted  . Left-sided weakness   . Chronic pain of left knee 12/22/2016  . Stroke ([place]) 12/22/2016  . Bradycardia 11/12/2016  . Generalized weakness 11/12/2016  . AKI (acute kidney injury) ([place]) 11/12/2016  . Hypophosphatemia 11/12/2016  . Leukocytosis 11/12/2016  . Diabetes mellitus with complication ([place]) 27/78/2423  . At risk for falling 09/05/2015  . Chronic respiratory failure ([place]) 07/22/2015  . COLD (chronic obstructive lung disease) (  [place]) 11/12/2014  . Dyspnea and respiratory abnormality 11/12/2014  . Lung nodule 04/02/2014  . Smoking 04/02/2014  . Hypokalemia 12/15/2013  . Diabetes mellitus, type 2 ([place]) 09/04/2013  . Essential (primary) hypertension 09/04/2013  . Absolute anemia 09/01/2013  . Chronic pain 08/16/2013  . CAFL (chronic airflow limitation) ([place]) 08/16/2013  . Chronic obstructive pulmonary disease ([place]) 08/16/2013  . Adiposity 08/11/2013  . Peripheral blood vessel disorder ([place]) 07/03/2013  . Cannot sleep 07/03/2013  . Peripheral vascular disease ([place]) 07/03/2013  . Iron deficiency anemia 07/01/2013  . Hypotestosteronism 07/01/2013  . 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 Compulsive tobacco user syndrome 06/01/2013  . Current tobacco use 06/01/2013  . Pulmonary nodule, right 77mm Aug 2014 03/25/2013  . Liver lesion 03/25/2013  . Abnormal magnetic resonance imaging study 03/21/2013  . Disease of liver 03/15/2013  . 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 Cancer screening 10/18/2012  . COPD (chronic obstructive pulmonary disease) ([place]) 08/11/2012  . Adverse reaction to bacterial vaccine 06/17/2012  . 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 Body mass index 27.0-27.9, adult 01/04/2012  . Emphysema 11/23/2011  . Chronic gout 11/23/2011  . Congestive heart failure ([place]) 11/23/2011  . Hypertension 11/23/2011  . Hyperlipidemia 11/23/2011  . Chronic bronchitis 11/23/2011  . Type 2 diabetes mellitus ([place]) 11/23/2011  .  Chronic back pain 11/23/2011  . Osteoarthritis of left knee 11/23/2011  . Avitaminosis D 11/05/2011  . Gout 11/05/2011  . Cardiomyopathy, idiopathic ([place]) 11/04/2011  . Nondependent alcohol abuse 11/04/2011  . Amnesia 11/04/2011  . Acid reflux 11/04/2011  . Arthropathia 11/04/2011  . Primary cardiomyopathy ([place]) 11/04/2011   Deneise Lever, Ray, CCC-SLP Speech-Language Pathologist  Aliene Altes 02/08/2017, 1:13 PM  [facility] [address] [place], [place], [address] Phone: [phone]   Fax:  [phone]   Name: James Zuniga MRN: [id number] Date of Birth: [birth year]

## 2017-02-08 NOTE — Patient Instructions (Signed)
  Memory Strategies  W - Write it down  A - Associate it with something  R - Repeat it  M - Mental Image     Play the memory game  Try to remember 3-5 items on your store list without looking  Study a detailed picture in a magazine for 1 minute, then write down everything you can remember from the picture

## 2017-02-08 NOTE — Patient Instructions (Addendum)
1. Grip Strengthening (Resistive Putty)  Green putty.  MAKE SURE YOU DO LONG, HARD SQUEEZES NOT LITTLE SMALL SQUEEZES.  Make sure you roll the putting into fat hot dog in between squeezes.        Squeeze putty using thumb and all fingers. Repeat _20___ times. Do __2__ sessions per day.  Coordination Activities  Perform the following activities for 15 - 20 minutes 1-2 times per day with left hand(s).  Gather all the items you will need and keep in a box to make it easier. You will need a deck of cards, a small ball, pennies, a plastic container with a small slit in the lid, and nuts and bolts.     Rotate ball in fingertips (clockwise and counter-clockwise).  Toss ball between hands.  Toss ball in air and catch with the same hand.  Flip cards 1 at a time as fast as you can.  Pick up coins and place in container or coin bank.  Pick up coins and stack.  Pick up coins one at a time until you get 5-10 in your hand, then move coins from palm to fingertips to stack one at a time.  Screw together nuts and bolts, then unfasten.   Scoot all the way into the table and place your right arm on the table. This will keep you from leaning to the right.  Make sure you don't hike your left shoulder when doing these activities. When rotating the ball in your finger tips go slow enough that you get all your fingers involved Use a small ball that doesn't bounce - you can usually find these at [facility], in toy stores or maybe the [employer] When flipping cards do one card a time but go as fast as you can When doing the coin activities start with pennies - when pennies get easier you can switch to dimes When doing nuts and bolts, keep your right hand still and turn with your left hand. Start with large and medium size   Copyright  VHI. All rights reserved.

## 2017-02-12 ENCOUNTER — Ambulatory Visit: Payer: Self-pay

## 2017-02-15 ENCOUNTER — Ambulatory Visit: Payer: PPO | Admitting: Speech Pathology

## 2017-02-15 ENCOUNTER — Encounter: Payer: Self-pay | Admitting: Physical Therapy

## 2017-02-15 ENCOUNTER — Ambulatory Visit: Payer: PPO | Attending: Internal Medicine | Admitting: Physical Therapy

## 2017-02-15 DIAGNOSIS — R29898 Other symptoms and signs involving the musculoskeletal system: Secondary | ICD-10-CM | POA: Diagnosis not present

## 2017-02-15 DIAGNOSIS — R278 Other lack of coordination: Secondary | ICD-10-CM | POA: Diagnosis not present

## 2017-02-15 DIAGNOSIS — I69915 Cognitive social or emotional deficit following unspecified cerebrovascular disease: Secondary | ICD-10-CM | POA: Diagnosis not present

## 2017-02-15 DIAGNOSIS — R2689 Other abnormalities of gait and mobility: Secondary | ICD-10-CM | POA: Diagnosis not present

## 2017-02-15 DIAGNOSIS — M6281 Muscle weakness (generalized): Secondary | ICD-10-CM | POA: Diagnosis not present

## 2017-02-15 DIAGNOSIS — R41841 Cognitive communication deficit: Secondary | ICD-10-CM | POA: Insufficient documentation

## 2017-02-15 DIAGNOSIS — R208 Other disturbances of skin sensation: Secondary | ICD-10-CM | POA: Insufficient documentation

## 2017-02-15 DIAGNOSIS — I69954 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: Secondary | ICD-10-CM | POA: Insufficient documentation

## 2017-02-15 NOTE — Patient Instructions (Signed)
EXTENSION: Over Bolster (Active)    Lie on back, legs over pillow/rolled blanket. Place green band around your ankles. Separate your ankles to put tension on the band. Slowly straighten right knee, pressing the back of your knee against the pillow and hold for 3 seconds. Repeat on left.  Complete __2_ sets of __10_ repetitions. Perform __1_ sessions per day.  Copyright  VHI. All rights reserved.   Bridging    Cross your arms across your chest. Tighten your stomach (like someone is about to punch you in the stomach), then slowly raise buttocks 1-2 inches from floor, keeping stomach tight. Repeat ___10_ times per set. Do ___1_ sets per session. Do ___1_ sessions per day.  http://orth.exer.us/1096   Copyright  VHI. All rights reserved.    FLEXION: Standing - Stable (Active)    Stand, both feet flat. Put red band around right ankle. Step on other end of band with left foot. Bend right knee, bringing heel toward buttocks. Repeat x 10 reps. Use as little arm support as needed on counter. Repeat on left leg x 10. Complete _2__ sets  Perform _1__ sessions per day.  http://gtsc.exer.us/240   Copyright  VHI. All rights reserved.

## 2017-02-15 NOTE — Therapy (Signed)
[facility] [address] [place], [place], [address] Phone: [phone]   Fax:  [phone]  Physical Therapy Treatment  Patient Details  Name: James Zuniga MRN: [id number] Date of Birth: [birth year] Referring Provider: Eldridge Abrahams NP  Encounter Date: 02/15/2017      PT End of Session - 02/15/17 1805    Visit Number 2   Number of Visits 9   Date for PT Re-Evaluation 04/04/17   Authorization Type [employer]   Authorization Time Period 02/03/17 TO 04/04/17   PT Start Time 0847   PT Stop Time 0930   PT Time Calculation (min) 43 min   Equipment Utilized During Treatment Gait belt   Activity Tolerance Patient tolerated treatment well   Behavior During Therapy [facility] for tasks assessed/performed      Past Medical History:  Diagnosis Date  . CHF (congestive heart failure) ([place])   . Chronic airway obstruction ([place])   . Chronic pain   . Coronary artery disease   . Diabetes mellitus   . Disorder of liver   . Esophageal reflux   . Gout   . Hyperlipidemia   . Hypertension   . Hypokalemia   . Hypokalemia 11/2016  . Hypotestosteronism 07/01/2013  . Insomnia   . Iron deficiency anemia, unspecified 07/01/2013  . Memory loss   . PVD (peripheral vascular disease) ([place])   . Renal disorder   . Sleep apnea   . Vitamin D deficiency     Past Surgical History:  Procedure Laterality Date  . KNEE ARTHROSCOPY    . NO PAST SURGERIES    . TEE WITHOUT CARDIOVERSION N/A 12/24/2016   Procedure: TRANSESOPHAGEAL ECHOCARDIOGRAM (TEE);  Surgeon: Fay Records, MD;  Location: [facility];  Service: Cardiovascular;  Laterality: N/A;    There were no vitals filed for this visit.      Subjective Assessment - 02/15/17 0850    Subjective Denies falls. Reports he forgot to bring his knee braces today. (Brought them last week to OT appt). Tried to use one of them and it kept sliding down his leg.    Pertinent History HTN, CHF  EF 25-45%, DM, back pain (uses TENS), COPD, tremor LUE more than RUE   Limitations Walking   How long can you walk comfortably? inside store has to stop and rest    Patient Stated Goals be able to strengthen lt knee so he can have knee surgery    Pain Score 7    Pain Location Back   Pain Orientation Lower   Pain Descriptors / Indicators Pins and needles   Pain Type Chronic pain   Pain Onset More than a month ago   Pain Frequency Constant                         [facility] Adult PT Treatment/Exercise - 02/15/17 0856      Bed Mobility   Bed Mobility Supine to Sit;Sit to Supine   Supine to Sit 6: Modified independent (Device/Increase time)   Sit to Supine 6: Modified independent (Device/Increase time)     Transfers   Transfers Sit to Stand;Stand to Sit   Sit to Stand 6: Modified independent (Device/Increase time);With upper extremity assist   Stand to Sit 6: Modified independent (Device/Increase time);With upper extremity assist     Ambulation/Gait   Ambulation/Gait Assistance 6: Modified independent (Device/Increase time)   Ambulation Distance (Feet) 100 Feet  50   Assistive device  Straight cane   Gait Pattern Step-through pattern;Decreased step length - right;Decreased stance time - left;Decreased weight shift to left;Wide base of support   Ambulation Surface Level   Stairs Yes   Stairs Assistance 5: Supervision   Stairs Assistance Details (indicate cue type and reason) vc for sequencing to incr safety   Stair Management Technique One rail Left;Step to pattern;Alternating pattern;Forwards;With cane   Number of Stairs 8   Height of Stairs 6     Exercises   Exercises Knee/Hip     Knee/Hip Exercises: Standing   Knee Flexion Strengthening;Both;2 sets;10 reps  red band     Knee/Hip Exercises: Supine   Short Arc Quad Sets Strengthening;Left;10 reps;2 sets  Lt 3#; Rt 4#; bil green band   Bridges Strengthening;Both;2 sets;5 reps   Bridges Limitations vc for  abdominal set and to lift <2 inches to protect his back                PT Education - 02/15/17 1803    Education provided Yes   Education Details initiated HEP; safety with stairs   Person(s) Educated Patient   Methods Explanation;Demonstration;Verbal cues;Handout   Comprehension Verbalized understanding;Returned demonstration;Verbal cues required;Need further instruction             PT Long Term Goals - 02/03/17 1624      PT LONG TERM GOAL #1   Title Patient will be independent with HEP for strengthening and balance.    Time 4   Period Weeks   Status New   Target Date 03/05/17     PT LONG TERM GOAL #2   Title Patient  will be able to verbalize plan to continue exercise/activity post-discharge from PT.    Time 4   Period Weeks   Status New   Target Date 03/05/17     PT LONG TERM GOAL #3   Title Patient will agree to orthotist consult to discuss possilble orthotics to reduce knee buckling and reduce fall risk.    Time 4   Period Weeks   Status New     PT LONG TERM GOAL #4   Title Patient will increase Berg score by 3 points compared to baseline to indicate lesser fall risk.    Time 4   Period Weeks   Status New     PT LONG TERM GOAL #5   Title Patient can verbalize signs and symptoms of CVA and appropriate course of action.   Time 4   Period Weeks   Status New               Plan - 02/15/17 1807    Clinical Impression Statement Session focused on gait and stair training for safe/proper use of cane related to Lt knee pain and weakness. HEP initiated with knee strengthening with goal to reduce risk of knee buckling. Patient very appreciative of education and anticipate will make good progress.    Rehab Potential Good   Clinical Impairments Affecting Rehab Potential degree of back and knee pain   PT Frequency 2x / week   PT Duration 4 weeks   PT Treatment/Interventions ADLs/Self Care Home Management;Functional mobility training;Stair training;Gait  training;DME Instruction;Therapeutic activities;Therapeutic exercise;Balance training;Neuromuscular re-education;Patient/family education;Orthotic Fit/Training   PT Next Visit Plan check HEP given 8/6 to strengthen Lt knee (with noted incr arthritis & to prep for possible knee surgery); if pt brought knee braces, determine if either are beneficial to wear; finish elements of Berg not completed on eval to get full Merrilee Jansky  score (has goal related to [facility]); add standing hip ex's for for strength and balance    Consulted and Agree with Plan of Care Patient;Family member/caregiver   Family Member Consulted wife      Patient will benefit from skilled therapeutic intervention in order to improve the following deficits and impairments:  Abnormal gait, Cardiopulmonary status limiting activity, Decreased activity tolerance, Decreased balance, Decreased coordination, Decreased endurance, Decreased knowledge of use of DME, Decreased mobility, Decreased strength, Difficulty walking, Obesity, Pain  Visit Diagnosis: Muscle weakness (generalized)  Other abnormalities of gait and mobility  Other symptoms and signs involving the musculoskeletal system     Problem List Patient Active Problem List   Diagnosis Date Noted  . Left-sided weakness   . Chronic pain of left knee 12/22/2016  . Stroke ([place]) 12/22/2016  . Bradycardia 11/12/2016  . Generalized weakness 11/12/2016  . AKI (acute kidney injury) ([place]) 11/12/2016  . Hypophosphatemia 11/12/2016  . Leukocytosis 11/12/2016  . Diabetes mellitus with complication ([place]) 67/61/9509  . At risk for falling 09/05/2015  . Chronic respiratory failure ([place]) 07/22/2015  . COLD (chronic obstructive lung disease) ([place]) 11/12/2014  . Dyspnea and respiratory abnormality 11/12/2014  . Lung nodule 04/02/2014  . Smoking 04/02/2014  . Hypokalemia 12/15/2013  . Diabetes mellitus, type 2 ([place]) 09/04/2013  . Essential (primary) hypertension 09/04/2013  . Absolute anemia  09/01/2013  . Chronic pain 08/16/2013  . CAFL (chronic airflow limitation) ([place]) 08/16/2013  . Chronic obstructive pulmonary disease ([place]) 08/16/2013  . Adiposity 08/11/2013  . Peripheral blood vessel disorder ([place]) 07/03/2013  . Cannot sleep 07/03/2013  . Peripheral vascular disease ([place]) 07/03/2013  . Iron deficiency anemia 07/01/2013  . Hypotestosteronism 07/01/2013  . 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 Compulsive tobacco user syndrome 06/01/2013  . Current tobacco use 06/01/2013  . Pulmonary nodule, right 90mm Aug 2014 03/25/2013  . Liver lesion 03/25/2013  . Abnormal magnetic resonance imaging study 03/21/2013  . Disease of liver 03/15/2013  . 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 Cancer screening 10/18/2012  . COPD (chronic obstructive pulmonary disease) ([place]) 08/11/2012  . Adverse reaction to bacterial vaccine 06/17/2012  . 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 Body mass index 27.0-27.9, adult 01/04/2012  . Emphysema 11/23/2011  . Chronic gout 11/23/2011  . Congestive heart failure ([place]) 11/23/2011  . Hypertension 11/23/2011  . Hyperlipidemia 11/23/2011  . Chronic bronchitis 11/23/2011  . Type 2 diabetes mellitus ([place]) 11/23/2011  . Chronic back pain 11/23/2011  . Osteoarthritis of left knee 11/23/2011  . Avitaminosis D 11/05/2011  . Gout 11/05/2011  . Cardiomyopathy, idiopathic ([place]) 11/04/2011  . Nondependent alcohol abuse 11/04/2011  . Amnesia 11/04/2011  . Acid reflux 11/04/2011  . Arthropathia 11/04/2011  . Primary cardiomyopathy ([place]) 11/04/2011    Rexanne Mano, PT 02/15/2017, 6:12 PM  [facility] [address] [place], [place], [address] Phone: [phone]   Fax:  [phone]  Name: HRISHIKESH HOEG MRN: [id number] Date of Birth: April 28, 1956

## 2017-02-15 NOTE — Therapy (Signed)
[facility] [address] [place], [place], [address] Phone: [phone]   Fax:  [phone]  Speech Language Pathology Treatment  Patient Details  Name: James Zuniga MRN: [id number] Date of Birth: 1955-12-15 Referring Provider: Eldridge Abrahams, NP  Encounter Date: 02/15/2017      End of Session - 02/15/17 1306    Visit Number 3   Number of Visits 17   Date for SLP Re-Evaluation 04/09/17   SLP Start Time 0803   SLP Stop Time  0846   SLP Time Calculation (min) 43 min   Activity Tolerance Patient tolerated treatment well      Past Medical History:  Diagnosis Date  . CHF (congestive heart failure) ([place])   . Chronic airway obstruction ([place])   . Chronic pain   . Coronary artery disease   . Diabetes mellitus   . Disorder of liver   . Esophageal reflux   . Gout   . Hyperlipidemia   . Hypertension   . Hypokalemia   . Hypokalemia 11/2016  . Hypotestosteronism 07/01/2013  . Insomnia   . Iron deficiency anemia, unspecified 07/01/2013  . Memory loss   . PVD (peripheral vascular disease) ([place])   . Renal disorder   . Sleep apnea   . Vitamin D deficiency     Past Surgical History:  Procedure Laterality Date  . KNEE ARTHROSCOPY    . NO PAST SURGERIES    . TEE WITHOUT CARDIOVERSION N/A 12/24/2016   Procedure: TRANSESOPHAGEAL ECHOCARDIOGRAM (TEE);  Surgeon: Fay Records, MD;  Location: [facility];  Service: Cardiovascular;  Laterality: N/A;    There were no vitals filed for this visit.      Subjective Assessment - 02/15/17 0804    Subjective "I might as well tell you now; I didn't do too good with that thing." (re: homework)   Currently in Pain? Yes   Pain Score 7    Pain Location Back   Pain Orientation Lower   Pain Descriptors / Indicators Aching   Pain Type Chronic pain   Pain Onset More than a month ago   Aggravating Factors  rain   Pain Relieving Factors brace   Multiple Pain Sites Yes   Pain Score  7   Pain Location Knee   Pain Orientation Right;Left   Pain Descriptors / Indicators Aching   Pain Type Chronic pain   Pain Frequency Intermittent   Pain Relieving Factors brace               ADULT SLP TREATMENT - 02/15/17 0806      General Information   Behavior/Cognition Alert;Cooperative   Patient Positioning Upright in chair   Oral care provided N/A     Treatment Provided   Treatment provided Cognitive-Linquistic     Cognitive-Linquistic Treatment   Treatment focused on Cognition   Skilled Treatment Pt showed SLP to-do list he kept for one day last week; did not record aside from one day. SLP provided information and examples of day planners for recording daily and future activities; with verbal cues pt wrote a reminder to bring a planner to next session. Simple functional math with max A and consistent verbal, demonstration and visual cues for attention.     Assessment / Recommendations / Plan   Plan Continue with current plan of care     Progression Toward Goals   Progression toward goals Progressing toward goals          SLP Education - 02/15/17  74    Education provided Yes   Education Details deficit areas, use of day planner vs. notebook to record daily and future activities   Person(s) Educated Patient   Methods Explanation;Verbal cues   Comprehension Verbalized understanding;Need further instruction          SLP Short Term Goals - 02/15/17 0816      SLP SHORT TERM GOAL #1   Title pt will demo emergent awareness 80% of the time with simple cognitive linguistic tasks using modified indpendence (rechecks) x3 sessions   Time 3   Period Weeks   Status On-going     SLP SHORT TERM GOAL #2   Title pt will double check (recheck) his work with rare min A to do so x3 sessions   Time 3   Period Weeks   Status On-going     SLP SHORT TERM GOAL #3   Title  pt will achieve 80% success with simple tasks involving time, money, calendar, etc. with rare min  A over 3 sessions   Time 3   Period Weeks   Status On-going     SLP SHORT TERM GOAL #4   Title pt will tell SLP 4 memory strategies with rare min A x3 sessions   Time 3   Period Weeks   Status On-going          SLP Long Term Goals - 02/15/17 6644      SLP LONG TERM GOAL #1   Title pt will demo anticipatory awareness with mod complex cognitive linguistic tasks by using modified indpendence (rechecks) x3 sessions   Time 7   Period Weeks   Status On-going     SLP LONG TERM GOAL #2   Title pt will use a memory strategy in 4 therapy sessions   Time 7   Period Weeks   Status On-going     SLP LONG TERM GOAL #3   Title pt will demo [facility] organziational skills for mod complex cognitive-linguistic tasks with rare min A x 3 sessions   Time 7   Period Weeks   Status On-going     SLP LONG TERM GOAL #4   Title pt will provide attention to detail adequate for finding errors in mod complex cognitive linguistic tasks with rare questioning cues x3 sessions   Time 7   Period Weeks   Status On-going          Plan - 02/15/17 1305    Clinical Impression Statement Pt presents today with mod 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 Pt would benefit from skilled ST to improve his cognitive communication skills to return to as close to PLOF as possible.   Speech Therapy Frequency 2x / week   Treatment/Interventions Compensatory techniques;Cognitive reorganization;Environmental controls;Internal/external aids;SLP instruction and feedback;Functional tasks;Cueing hierarchy;Patient/family education   Potential to Achieve Goals Good   Potential Considerations Severity of impairments   SLP Home Exercise Plan attention task, pt to purchase a day planner and bring to next session   Consulted and Agree with Plan of Care Patient       Patient will benefit from skilled therapeutic intervention in order to improve the following deficits and impairments:   Cognitive communication deficit    Problem List Patient Active Problem List   Diagnosis Date Noted  . Left-sided weakness   . Chronic pain of left knee 12/22/2016  .  Stroke ([place]) 12/22/2016  . Bradycardia 11/12/2016  . Generalized weakness 11/12/2016  . AKI (acute kidney injury) ([place]) 11/12/2016  . Hypophosphatemia 11/12/2016  . Leukocytosis 11/12/2016  . Diabetes mellitus with complication ([place]) 83/25/4982  . At risk for falling 09/05/2015  . Chronic respiratory failure ([place]) 07/22/2015  . COLD (chronic obstructive lung disease) ([place]) 11/12/2014  . Dyspnea and respiratory abnormality 11/12/2014  . Lung nodule 04/02/2014  . Smoking 04/02/2014  . Hypokalemia 12/15/2013  . Diabetes mellitus, type 2 ([place]) 09/04/2013  . Essential (primary) hypertension 09/04/2013  . Absolute anemia 09/01/2013  . Chronic pain 08/16/2013  . CAFL (chronic airflow limitation) ([place]) 08/16/2013  . Chronic obstructive pulmonary disease ([place]) 08/16/2013  . Adiposity 08/11/2013  . Peripheral blood vessel disorder ([place]) 07/03/2013  . Cannot sleep 07/03/2013  . Peripheral vascular disease ([place]) 07/03/2013  . Iron deficiency anemia 07/01/2013  . Hypotestosteronism 07/01/2013  . [place] (erectile dysfunction) of organic origin 06/07/2013  . Compulsive tobacco user syndrome 06/01/2013  . Current tobacco use 06/01/2013  . Pulmonary nodule, right 95mm Aug 2014 03/25/2013  . Liver lesion 03/25/2013  . Abnormal magnetic resonance imaging study 03/21/2013  . Disease of liver 03/15/2013  . 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 Cancer screening 10/18/2012  . COPD (chronic obstructive pulmonary disease) ([place]) 08/11/2012  . Adverse reaction to bacterial vaccine 06/17/2012  . 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 Body mass index 27.0-27.9, adult 01/04/2012  . Emphysema 11/23/2011  . Chronic  gout 11/23/2011  . Congestive heart failure ([place]) 11/23/2011  . Hypertension 11/23/2011  . Hyperlipidemia 11/23/2011  . Chronic bronchitis 11/23/2011  . Type 2 diabetes mellitus ([place]) 11/23/2011  . Chronic back pain 11/23/2011  . Osteoarthritis of left knee 11/23/2011  . Avitaminosis D 11/05/2011  . Gout 11/05/2011  . Cardiomyopathy, idiopathic ([place]) 11/04/2011  . Nondependent alcohol abuse 11/04/2011  . Amnesia 11/04/2011  . Acid reflux 11/04/2011  . Arthropathia 11/04/2011  . Primary cardiomyopathy ([place]) 11/04/2011   Deneise Lever, [place], [place] 02/15/2017, 1:09 PM  [facility] [address] [place], [place], [address] Phone: [phone]   Fax:  [phone]   Name: James Zuniga MRN: [id number] Date of Birth: 07-30-1955

## 2017-02-16 DIAGNOSIS — Z8673 Personal history of transient ischemic attack (TIA), and cerebral infarction without residual deficits: Secondary | ICD-10-CM | POA: Diagnosis not present

## 2017-02-16 DIAGNOSIS — R001 Bradycardia, unspecified: Secondary | ICD-10-CM | POA: Diagnosis not present

## 2017-02-16 DIAGNOSIS — I428 Other cardiomyopathies: Secondary | ICD-10-CM | POA: Diagnosis not present

## 2017-02-18 ENCOUNTER — Other Ambulatory Visit (INDEPENDENT_AMBULATORY_CARE_PROVIDER_SITE_OTHER): Payer: Self-pay | Admitting: Orthopaedic Surgery

## 2017-02-19 ENCOUNTER — Ambulatory Visit: Payer: PPO

## 2017-02-19 ENCOUNTER — Ambulatory Visit: Payer: PPO | Admitting: Occupational Therapy

## 2017-02-19 ENCOUNTER — Encounter: Payer: Self-pay | Admitting: Occupational Therapy

## 2017-02-19 DIAGNOSIS — I69915 Cognitive social or emotional deficit following unspecified cerebrovascular disease: Secondary | ICD-10-CM

## 2017-02-19 DIAGNOSIS — M6281 Muscle weakness (generalized): Secondary | ICD-10-CM

## 2017-02-19 DIAGNOSIS — R278 Other lack of coordination: Secondary | ICD-10-CM

## 2017-02-19 DIAGNOSIS — R2689 Other abnormalities of gait and mobility: Secondary | ICD-10-CM

## 2017-02-19 DIAGNOSIS — R208 Other disturbances of skin sensation: Secondary | ICD-10-CM

## 2017-02-19 DIAGNOSIS — I69954 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: Secondary | ICD-10-CM

## 2017-02-19 NOTE — Therapy (Signed)
[facility] [address] [place], [place], [address] Phone: [phone]   Fax:  [phone]  Occupational Therapy Treatment  Patient Details  Name: James Zuniga MRN: [id number] Date of Birth: [birth year] Referring Provider: Eldridge Abrahams  Encounter Date: 02/19/2017      OT End of Session - 02/19/17 1107    Visit Number 3   Number of Visits 9   Authorization Type [employer] $15 co pay, 0 visit limit   OT Start Time 0802   OT Stop Time 0844   OT Time Calculation (min) 42 min   Activity Tolerance Patient tolerated treatment well      Past Medical History:  Diagnosis Date  . CHF (congestive heart failure) ([place])   . Chronic airway obstruction ([place])   . Chronic pain   . Coronary artery disease   . Diabetes mellitus   . Disorder of liver   . Esophageal reflux   . Gout   . Hyperlipidemia   . Hypertension   . Hypokalemia   . Hypokalemia 11/2016  . Hypotestosteronism 07/01/2013  . Insomnia   . Iron deficiency anemia, unspecified 07/01/2013  . Memory loss   . PVD (peripheral vascular disease) ([place])   . Renal disorder   . Sleep apnea   . Vitamin D deficiency     Past Surgical History:  Procedure Laterality Date  . KNEE ARTHROSCOPY    . NO PAST SURGERIES    . TEE WITHOUT CARDIOVERSION N/A 12/24/2016   Procedure: TRANSESOPHAGEAL ECHOCARDIOGRAM (TEE);  Surgeon: Fay Records, MD;  Location: [facility];  Service: Cardiovascular;  Laterality: N/A;    There were no vitals filed for this visit.      Subjective Assessment - 02/19/17 0806    Pertinent History see epic pt with R CVA   Currently in Pain? Yes   Pain Score 8    Pain Location Back   Pain Orientation Lower   Pain Descriptors / Indicators Pins and needles   Pain Type Chronic pain   Pain Onset More than a month ago   Pain Frequency Constant   Aggravating Factors  its been hurting all week not sure- maybe the PT exercises but I don't want to stop  doing them because I need them.  Encouraged pt to share with PT in next session.    Pain Relieving Factors brace                      OT Treatments/Exercises (OP) - 02/19/17 0001      ADLs   ADL Comments Pt reports that he is completing HEP for strength and coordination for L hand at home - "picking up the pennies is the hardest."       Exercises   Exercises Hand     Hand Exercises   Theraputty - Locate Pegs 15 pegs in green putty to address both pinch strength and corodination.    Hand Gripper with Small Beads Gripper on #3 to pick up 1 inch blocks.  Pt with moderate dificutly - reduced resistance to #2 and pt with improved performance and minimal dropping.  Pt rated fatigue in hand 5/10 after activity.       Fine Motor Coordination   Other Fine Motor Exercises Grooved peg board to address in hand manipulation - pt required increased time to complete and had mderate difficulty with task.  O'Connor board without tweezers - pt able to complete with increased time.  Other Fine Motor Exercises In hand manipulation with feng shway balls side by side and then over/under followed by alternating. Initially pt had significant difficulty however improved when pt could do it first with rigth hand and then immediately follow with left hand - also improved with practice.                      OT Long Term Goals - 02/19/17 1104      OT LONG TERM GOAL #1   Title Patient will complete a Home Exercise Program designed to improve strength in left hand   Time 4   Period Weeks   Status Achieved     OT LONG TERM GOAL #2   Title Patient will complete a home exercise program designed to improve coordination in left hand   Time 4   Period Weeks   Status Achieved     OT LONG TERM GOAL #3   Title Patient will safely carry open drink across the room (25+ feet) with left hand, while walking with right hand on cane.     Time 4   Period Weeks   Status On-going     OT LONG  TERM GOAL #4   Title Patient will demonstrate improved coordination for buttoning a dress shirt, as evidenced by reduced time for 9 hole peg test by 5 seconds.     Baseline 37.22 baseline - goal ~35 seconds   Time 4   Period Weeks   Status On-going     OT LONG TERM GOAL #5   Title Patient will demonstrate improved strength to aide with opening twist top bottles, etc, as evidenced by improved grip strength by 10 lbs.     Time 4   Period Weeks   Status On-going               Plan - 02/19/17 1105    Clinical Impression Statement Pt progressing toward goals. Pt reports that he has been "working hard" at home on [employer] Potential Good   OT Frequency 2x / week   OT Duration 4 weeks   OT Treatment/Interventions Self-care/ADL training;Therapeutic exercise;Functional Mobility Training;Patient/family education;Balance training;Neuromuscular education;Therapeutic activities;DME and/or AE instruction;Cognitive remediation/compensation;Visual/perceptual remediation/compensation   Plan grip strength, coordination and functional use of LUE.     Consulted and Agree with Plan of Care Patient      Patient will benefit from skilled therapeutic intervention in order to improve the following deficits and impairments:  Cardiopulmonary status limiting activity, Decreased activity tolerance, Decreased balance, Decreased cognition, Decreased coordination, Decreased safety awareness, Decreased mobility, Decreased knowledge of precautions, Decreased endurance, Decreased strength, Difficulty walking, Increased edema, Impaired UE functional use, Impaired tone, Impaired sensation, Impaired flexibility, Impaired perceived functional ability, Impaired vision/preception, Pain  Visit Diagnosis: Muscle weakness (generalized)  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 Left-sided weakness   . Chronic pain of left knee 12/22/2016  . Stroke ([place]) 12/22/2016  . Bradycardia 11/12/2016  . Generalized weakness 11/12/2016  . AKI (acute kidney injury) ([place]) 11/12/2016  . Hypophosphatemia 11/12/2016  . Leukocytosis 11/12/2016  . Diabetes mellitus with complication ([place]) 95/18/8416  . At risk for falling 09/05/2015  . Chronic respiratory failure ([place]) 07/22/2015  . COLD (chronic obstructive  lung disease) ([place]) 11/12/2014  . Dyspnea and respiratory abnormality 11/12/2014  . Lung nodule 04/02/2014  . Smoking 04/02/2014  . Hypokalemia 12/15/2013  . Diabetes mellitus, type 2 ([place]) 09/04/2013  . Essential (primary) hypertension 09/04/2013  . Absolute anemia 09/01/2013  . Chronic pain 08/16/2013  . CAFL (chronic airflow limitation) ([place]) 08/16/2013  . Chronic obstructive pulmonary disease ([place]) 08/16/2013  . Adiposity 08/11/2013  . Peripheral blood vessel disorder ([place]) 07/03/2013  . Cannot sleep 07/03/2013  . Peripheral vascular disease ([place]) 07/03/2013  . Iron deficiency anemia 07/01/2013  . Hypotestosteronism 07/01/2013  . 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 Compulsive tobacco user syndrome 06/01/2013  . Current tobacco use 06/01/2013  . Pulmonary nodule, right 23mm Aug 2014 03/25/2013  . Liver lesion 03/25/2013  . Abnormal magnetic resonance imaging study 03/21/2013  . Disease of liver 03/15/2013  . 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 Cancer screening 10/18/2012  . COPD (chronic obstructive pulmonary disease) ([place]) 08/11/2012  . Adverse reaction to bacterial vaccine 06/17/2012  . 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 Body mass index 27.0-27.9, adult 01/04/2012  . Emphysema 11/23/2011  . Chronic gout 11/23/2011  . Congestive heart failure ([place]) 11/23/2011  .  Hypertension 11/23/2011  . Hyperlipidemia 11/23/2011  . Chronic bronchitis 11/23/2011  . Type 2 diabetes mellitus ([place]) 11/23/2011  . Chronic back pain 11/23/2011  . Osteoarthritis of left knee 11/23/2011  . Avitaminosis D 11/05/2011  . Gout 11/05/2011  . Cardiomyopathy, idiopathic ([place]) 11/04/2011  . Nondependent alcohol abuse 11/04/2011  . Amnesia 11/04/2011  . Acid reflux 11/04/2011  . Arthropathia 11/04/2011  . Primary cardiomyopathy ([place]) 11/04/2011    Quay Burow, OTR/L 02/19/2017, 11:08 AM  [facility] [address] [place], [place], [address] Phone: [phone]   Fax:  [phone]  Name: James Zuniga MRN: [id number] Date of Birth: January 28, 1956

## 2017-02-19 NOTE — Therapy (Signed)
[facility] [address] [place], [place], [address] Phone: [phone]   Fax:  [phone]  Physical Therapy Treatment  Patient Details  Name: James Zuniga MRN: [id number] Date of Birth: 1956/06/05 Referring Provider: Eldridge Abrahams NP  Encounter Date: 02/19/2017      PT End of Session - 02/19/17 0919    Visit Number 3   Number of Visits 9   Date for PT Re-Evaluation 04/04/17   Authorization Type [employer]   Authorization Time Period 02/03/17 TO 04/04/17   PT Start Time 0847   PT Stop Time 0927   PT Time Calculation (min) 40 min   Activity Tolerance Patient tolerated treatment well   Behavior During Therapy [facility] for tasks assessed/performed      Past Medical History:  Diagnosis Date  . CHF (congestive heart failure) ([place])   . Chronic airway obstruction ([place])   . Chronic pain   . Coronary artery disease   . Diabetes mellitus   . Disorder of liver   . Esophageal reflux   . Gout   . Hyperlipidemia   . Hypertension   . Hypokalemia   . Hypokalemia 11/2016  . Hypotestosteronism 07/01/2013  . Insomnia   . Iron deficiency anemia, unspecified 07/01/2013  . Memory loss   . PVD (peripheral vascular disease) ([place])   . Renal disorder   . Sleep apnea   . Vitamin D deficiency     Past Surgical History:  Procedure Laterality Date  . KNEE ARTHROSCOPY    . NO PAST SURGERIES    . TEE WITHOUT CARDIOVERSION N/A 12/24/2016   Procedure: TRANSESOPHAGEAL ECHOCARDIOGRAM (TEE);  Surgeon: Fay Records, MD;  Location: [facility];  Service: Cardiovascular;  Laterality: N/A;    There were no vitals filed for this visit.      Subjective Assessment - 02/19/17 0849    Subjective Pt denied falls since last visit. Pt told OT he believes PT exercises are incr. back pain.    Pertinent History HTN, CHF EF 25-45%, DM, back pain (uses TENS), COPD, tremor LUE more than RUE   Patient Stated Goals be able to strengthen  lt knee so he can have knee surgery    Currently in Pain? Yes   Pain Score 8    Pain Location Back   Pain Orientation Lower   Pain Descriptors / Indicators Pins and needles   Pain Radiating Towards LLE   Pain Onset More than a month ago   Pain Frequency Constant   Aggravating Factors  Pt feels he tries to overdo it, he believes PT exercises might be incr. pain but doesn't want to stop exercises   Pain Relieving Factors medication, back brace         Therex: PT reviewed pt's previous HEP and modified as tolerated to reduce chronic LBP. Pt trialed red theraband during SAQs and reported it was too easy. Pt noted to lift LE off bolster during SAQs, which incr. LBP, once corrected LBP reduced. PT educated pt on wearing back brace for 1-2 days only to address acute back pain, as prolonged wear of brace can lead to muscle atrophy. Pt reported no incr. In pain during session. Cues and demo for technique. Please see pt instructions for HEP details.                         PT Education - 02/19/17 0919    Education provided Yes   Education Details  PT updated HEP to reduce LBP and added stretches.   Person(s) Educated Patient   Methods Explanation;Verbal cues;Handout;Tactile cues   Comprehension Returned demonstration;Verbalized understanding             PT Long Term Goals - 02/03/17 1624      PT LONG TERM GOAL #1   Title Patient will be independent with HEP for strengthening and balance.    Time 4   Period Weeks   Status New   Target Date 03/05/17     PT LONG TERM GOAL #2   Title Patient  will be able to verbalize plan to continue exercise/activity post-discharge from PT.    Time 4   Period Weeks   Status New   Target Date 03/05/17     PT LONG TERM GOAL #3   Title Patient will agree to orthotist consult to discuss possilble orthotics to reduce knee buckling and reduce fall risk.    Time 4   Period Weeks   Status New     PT LONG TERM GOAL #4   Title  Patient will increase Berg score by 3 points compared to baseline to indicate lesser fall risk.    Time 4   Period Weeks   Status New     PT LONG TERM GOAL #5   Title Patient can verbalize signs and symptoms of CVA and appropriate course of action.   Time 4   Period Weeks   Status New               Plan - 02/19/17 0919    Clinical Impression Statement Session focused on modifying and reviewing HEP, as pt reported incr. LBP. Pt tolerated updated HEP well and reported reduced LBP. Pt would continue to benefit from skilled PT to improve safety during functional mobility.    Rehab Potential Good   Clinical Impairments Affecting Rehab Potential degree of back and knee pain   PT Frequency 2x / week   PT Duration 4 weeks   PT Treatment/Interventions ADLs/Self Care Home Management;Functional mobility training;Stair training;Gait training;DME Instruction;Therapeutic activities;Therapeutic exercise;Balance training;Neuromuscular re-education;Patient/family education;Orthotic Fit/Training   PT Next Visit Plan if pt brought knee braces, determine if either are beneficial to wear; finish elements of Berg not completed on eval to get full Berg score; add standing hip ex's for for strength and balance    Consulted and Agree with Plan of Care Patient;Family member/caregiver   Family Member Consulted wife      Patient will benefit from skilled therapeutic intervention in order to improve the following deficits and impairments:  Abnormal gait, Cardiopulmonary status limiting activity, Decreased activity tolerance, Decreased balance, Decreased coordination, Decreased endurance, Decreased knowledge of use of DME, Decreased mobility, Decreased strength, Difficulty walking, Obesity, Pain  Visit Diagnosis: Muscle weakness (generalized)  Other abnormalities of gait and mobility     Problem List Patient Active Problem List   Diagnosis Date Noted  . Left-sided weakness   . Chronic pain of left  knee 12/22/2016  . Stroke ([place]) 12/22/2016  . Bradycardia 11/12/2016  . Generalized weakness 11/12/2016  . AKI (acute kidney injury) ([place]) 11/12/2016  . Hypophosphatemia 11/12/2016  . Leukocytosis 11/12/2016  . Diabetes mellitus with complication ([place]) 15/94/5859  . At risk for falling 09/05/2015  . Chronic respiratory failure ([place]) 07/22/2015  . COLD (chronic obstructive lung disease) ([place]) 11/12/2014  . Dyspnea and respiratory abnormality 11/12/2014  . Lung nodule 04/02/2014  . Smoking 04/02/2014  . Hypokalemia 12/15/2013  . Diabetes mellitus, type 2 (  [place]) 09/04/2013  . Essential (primary) hypertension 09/04/2013  . Absolute anemia 09/01/2013  . Chronic pain 08/16/2013  . CAFL (chronic airflow limitation) ([place]) 08/16/2013  . Chronic obstructive pulmonary disease ([place]) 08/16/2013  . Adiposity 08/11/2013  . Peripheral blood vessel disorder ([place]) 07/03/2013  . Cannot sleep 07/03/2013  . Peripheral vascular disease ([place]) 07/03/2013  . Iron deficiency anemia 07/01/2013  . Hypotestosteronism 07/01/2013  . 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 Compulsive tobacco user syndrome 06/01/2013  . Current tobacco use 06/01/2013  . Pulmonary nodule, right 47mm Aug 2014 03/25/2013  . Liver lesion 03/25/2013  . Abnormal magnetic resonance imaging study 03/21/2013  . Disease of liver 03/15/2013  . 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 Cancer screening 10/18/2012  . COPD (chronic obstructive pulmonary disease) ([place]) 08/11/2012  . Adverse reaction to bacterial vaccine 06/17/2012  . 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 Body mass index 27.0-27.9, adult 01/04/2012  . Emphysema 11/23/2011  . Chronic gout 11/23/2011  . Congestive heart failure ([place]) 11/23/2011  . Hypertension 11/23/2011  . Hyperlipidemia 11/23/2011  . Chronic bronchitis 11/23/2011  . Type 2 diabetes mellitus ([place]) 11/23/2011  . Chronic back pain 11/23/2011  . Osteoarthritis of left knee 11/23/2011  .  Avitaminosis D 11/05/2011  . Gout 11/05/2011  . Cardiomyopathy, idiopathic ([place]) 11/04/2011  . Nondependent alcohol abuse 11/04/2011  . Amnesia 11/04/2011  . Acid reflux 11/04/2011  . Arthropathia 11/04/2011  . Primary cardiomyopathy ([place]) 11/04/2011    Chera Slivka L 02/19/2017, 9:47 AM  [facility] [address] [place], [place], [address] Phone: [phone]   Fax:  [phone]  Name: JEMARIO POITRAS MRN: [id number] Date of Birth: January 25, 1956  Geoffry Paradise, PT,DPT 02/19/17 9:50 AM Phone: [phone] Fax: [phone]

## 2017-02-19 NOTE — Telephone Encounter (Signed)
Patient has an MRI order in the system, he has not scheduled it yet.  I tried to call patient to inquire about this refill request and got no answer.

## 2017-02-19 NOTE — Patient Instructions (Addendum)
EXTENSION: Over Bolster (Active)    Lie on back, legs over pillow/rolled blanket. Place green band around your ankles. Make sure you don't life your leg off the pillow. Separate your ankles to put tension on the band. Slowly straighten right knee, pressing the back of your knee against the pillow and hold for 3 seconds. Repeat on left.  Complete __3_ sets of __10_ repetitions. Perform __3_ sessions per week.  Copyright  VHI. All rights reserved.   Bridging    Cross your arms across your chest. Tighten your stomach (like someone is about to punch you in the stomach), then slowly raise buttocks 1-2 inches from floor, keeping stomach tight. Repeat ___10_ times per set. Do ___2_ sets per session. Do ___1_ sessions per day.  http://orth.exer.us/1096   Copyright  VHI. All rights reserved.    FLEXION: Standing - Stable (Active)    Stand, both feet flat. Put red band around right ankle. Step on other end of band with left foot. Bend right knee, bringing heel toward buttocks. Repeat x 10 reps. Use as little arm support as needed on counter. Repeat on left leg x 10. Complete _2__ sets  Perform _3__ sessions per week.  http://gtsc.exer.us/240   Copyright  VHI. All rights reserved.   Double Knee to Chest (Flexion)    Gently pull both knees toward chest. Feel stretch in lower back or buttock area. Breathing deeply, Hold __30__ seconds. Repeat __3__ times. Do __2-3__ sessions per day.  http://gt2.exer.us/227   Copyright  VHI. All rights reserved.

## 2017-02-22 ENCOUNTER — Ambulatory Visit: Payer: PPO | Admitting: Neurology

## 2017-02-22 ENCOUNTER — Encounter: Payer: Self-pay | Admitting: Occupational Therapy

## 2017-02-22 ENCOUNTER — Ambulatory Visit: Payer: PPO | Admitting: Occupational Therapy

## 2017-02-22 ENCOUNTER — Ambulatory Visit: Payer: PPO

## 2017-02-22 DIAGNOSIS — R278 Other lack of coordination: Secondary | ICD-10-CM

## 2017-02-22 DIAGNOSIS — R208 Other disturbances of skin sensation: Secondary | ICD-10-CM

## 2017-02-22 DIAGNOSIS — M6281 Muscle weakness (generalized): Secondary | ICD-10-CM

## 2017-02-22 DIAGNOSIS — R41841 Cognitive communication deficit: Secondary | ICD-10-CM

## 2017-02-22 DIAGNOSIS — I69915 Cognitive social or emotional deficit following unspecified cerebrovascular disease: Secondary | ICD-10-CM

## 2017-02-22 DIAGNOSIS — I69954 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: Secondary | ICD-10-CM

## 2017-02-22 NOTE — Patient Instructions (Signed)

## 2017-02-22 NOTE — Therapy (Signed)
[facility] [address] [place], [place], [address] Phone: [phone]   Fax:  [phone]  Occupational Therapy Treatment  Patient Details  Name: James Zuniga MRN: [id number] Date of Birth: January 11, 1956 Referring Provider: Eldridge Abrahams  Encounter Date: 02/22/2017      OT End of Session - 02/22/17 0930    Visit Number 4   Number of Visits 9   Authorization Type Health Team $15 co pay, 0 visit limit   OT Start Time 0846   OT Stop Time 0928   OT Time Calculation (min) 42 min   Activity Tolerance Patient tolerated treatment well      Past Medical History:  Diagnosis Date  . CHF (congestive heart failure) ([place])   . Chronic airway obstruction ([place])   . Chronic pain   . Coronary artery disease   . Diabetes mellitus   . Disorder of liver   . Esophageal reflux   . Gout   . Hyperlipidemia   . Hypertension   . Hypokalemia   . Hypokalemia 11/2016  . Hypotestosteronism 07/01/2013  . Insomnia   . Iron deficiency anemia, unspecified 07/01/2013  . Memory loss   . PVD (peripheral vascular disease) ([place])   . Renal disorder   . Sleep apnea   . Vitamin D deficiency     Past Surgical History:  Procedure Laterality Date  . KNEE ARTHROSCOPY    . NO PAST SURGERIES    . TEE WITHOUT CARDIOVERSION N/A 12/24/2016   Procedure: TRANSESOPHAGEAL ECHOCARDIOGRAM (TEE);  Surgeon: Fay Records, MD;  Location: [facility];  Service: Cardiovascular;  Laterality: N/A;    There were no vitals filed for this visit.      Subjective Assessment - 02/22/17 0849    Pertinent History see epic pt with R CVA   Currently in Pain? Yes   Pain Score 7    Pain Location Back   Pain Orientation Lower   Pain Descriptors / Indicators Pins and needles   Pain Type Chronic pain   Pain Onset More than a month ago   Pain Frequency Constant   Aggravating Factors  possibly exercises? not sure - my back has always been bad some days are worse than  others   Pain Relieving Factors meds, back brace.    Multiple Pain Sites Yes   Pain Score 7   Pain Location Knee   Pain Orientation Right;Left   Pain Descriptors / Indicators Aching   Pain Type Chronic pain   Pain Onset More than a month ago   Pain Frequency Intermittent   Aggravating Factors  walking too much - I was supposed to have surgery but then this happened.    Pain Relieving Factors brace                      OT Treatments/Exercises (OP) - 02/22/17 0001      Exercises   Exercises Hand     Hand Exercises   Hand Gripper with Small Beads Gripper on #2 stacking and unstacking stacks of 4 blocks to address both sustained grip strength and gross coordination.  Pt with moderate difficuly with this task -pt required 2 rest breaks and increased time.    Other Hand Exercises Modified use of [employer] peg board picking up each items individually initially and then asking pt to hold 2 items in hand to incorporate in hand manipulation. Pt with no difficulty with one item, minimal difficult with 2 items.  Pt with signficant difficulty when trying to manipulate 3 items in hand.  Pt needed signficantly increased time to complete task.                      OT Long Term Goals - 02/22/17 0928      OT LONG TERM GOAL #1   Title Patient will complete a Home Exercise Program designed to improve strength in left hand   Time 4   Period Weeks   Status Achieved     OT LONG TERM GOAL #2   Title Patient will complete a home exercise program designed to improve coordination in left hand   Time 4   Period Weeks   Status Achieved     OT LONG TERM GOAL #3   Title Patient will safely carry open drink across the room (25+ feet) with left hand, while walking with right hand on cane.     Time 4   Period Weeks   Status On-going     OT LONG TERM GOAL #4   Title Patient will demonstrate improved coordination for buttoning a dress shirt, as evidenced by reduced time for 9 hole  peg test by 5 seconds.     Baseline 37.22 baseline - goal ~35 seconds   Time 4   Period Weeks   Status On-going     OT LONG TERM GOAL #5   Title Patient will demonstrate improved strength to aide with opening twist top bottles, etc, as evidenced by improved grip strength by 10 lbs.     Time 4   Period Weeks   Status On-going               Plan - 02/22/17 7619    Clinical Impression Statement Pt continues to progress toward goals. Pt reports it is getting easier to use his left hand at home for functional tasks.    Rehab Potential Good   OT Frequency 2x / week   OT Duration 4 weeks   OT Treatment/Interventions Self-care/ADL training;Therapeutic exercise;Functional Mobility Training;Patient/family education;Balance training;Neuromuscular education;Therapeutic activities;DME and/or AE instruction;Cognitive remediation/compensation;Visual/perceptual remediation/compensation   Plan grip strength, coordination, functional use of LUE   Consulted and Agree with Plan of Care Patient      Patient will benefit from skilled therapeutic intervention in order to improve the following deficits and impairments:  Cardiopulmonary status limiting activity, Decreased activity tolerance, Decreased balance, Decreased cognition, Decreased coordination, Decreased safety awareness, Decreased mobility, Decreased knowledge of precautions, Decreased endurance, Decreased strength, Difficulty walking, Increased edema, Impaired UE functional use, Impaired tone, Impaired sensation, Impaired flexibility, Impaired perceived functional ability, Impaired vision/preception, Pain  Visit Diagnosis: Muscle weakness (generalized)  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 Left-sided weakness   . Chronic pain of left knee 12/22/2016  . Stroke ([place]) 12/22/2016  . Bradycardia 11/12/2016  . Generalized weakness 11/12/2016  . AKI (acute kidney injury) ([place]) 11/12/2016  . Hypophosphatemia 11/12/2016  . Leukocytosis 11/12/2016  . Diabetes mellitus with complication ([place]) 50/93/2671  . At risk for falling 09/05/2015  . Chronic respiratory failure ([place]) 07/22/2015  . COLD (chronic obstructive lung disease) ([place]) 11/12/2014  . Dyspnea and respiratory abnormality 11/12/2014  . Lung nodule 04/02/2014  . Smoking 04/02/2014  . Hypokalemia  12/15/2013  . Diabetes mellitus, type 2 ([place]) 09/04/2013  . Essential (primary) hypertension 09/04/2013  . Absolute anemia 09/01/2013  . Chronic pain 08/16/2013  . CAFL (chronic airflow limitation) ([place]) 08/16/2013  . Chronic obstructive pulmonary disease ([place]) 08/16/2013  . Adiposity 08/11/2013  . Peripheral blood vessel disorder ([place]) 07/03/2013  . Cannot sleep 07/03/2013  . Peripheral vascular disease ([place]) 07/03/2013  . Iron deficiency anemia 07/01/2013  . Hypotestosteronism 07/01/2013  . 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 Compulsive tobacco user syndrome 06/01/2013  . Current tobacco use 06/01/2013  . Pulmonary nodule, right 64mm Aug 2014 03/25/2013  . Liver lesion 03/25/2013  . Abnormal magnetic resonance imaging study 03/21/2013  . Disease of liver 03/15/2013  . 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 Cancer screening 10/18/2012  . COPD (chronic obstructive pulmonary disease) ([place]) 08/11/2012  . Adverse reaction to bacterial vaccine 06/17/2012  . 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 Body mass index 27.0-27.9, adult 01/04/2012  . Emphysema 11/23/2011  . Chronic gout 11/23/2011  . Congestive heart failure ([place]) 11/23/2011  . Hypertension 11/23/2011  . Hyperlipidemia 11/23/2011  . Chronic bronchitis 11/23/2011  . Type 2 diabetes  mellitus ([place]) 11/23/2011  . Chronic back pain 11/23/2011  . Osteoarthritis of left knee 11/23/2011  . Avitaminosis D 11/05/2011  . Gout 11/05/2011  . Cardiomyopathy, idiopathic ([place]) 11/04/2011  . Nondependent alcohol abuse 11/04/2011  . Amnesia 11/04/2011  . Acid reflux 11/04/2011  . Arthropathia 11/04/2011  . Primary cardiomyopathy ([place]) 11/04/2011    Quay Burow, OTR/L 02/22/2017, 9:30 AM  [facility] [address] [place], [place], [address] Phone: [phone]   Fax:  [phone]  Name: CHRISTPOHER SIEVERS MRN: [id number] Date of Birth: Mar 08, 1956

## 2017-02-22 NOTE — Therapy (Signed)
[facility] [address] [place], [place], [address] Phone: [phone]   Fax:  [phone]  Speech Language Pathology Treatment  Patient Details  Name: James Zuniga MRN: [id number] Date of Birth: 1956-07-04 Referring Provider: Eldridge Abrahams, NP  Encounter Date: 02/22/2017      End of Session - 02/22/17 0923    Visit Number 4   Number of Visits 17   Date for SLP Re-Evaluation 04/09/17   SLP Start Time 0804   SLP Stop Time  0845   SLP Time Calculation (min) 41 min   Activity Tolerance Patient tolerated treatment well      Past Medical History:  Diagnosis Date  . CHF (congestive heart failure) ([place])   . Chronic airway obstruction ([place])   . Chronic pain   . Coronary artery disease   . Diabetes mellitus   . Disorder of liver   . Esophageal reflux   . Gout   . Hyperlipidemia   . Hypertension   . Hypokalemia   . Hypokalemia 11/2016  . Hypotestosteronism 07/01/2013  . Insomnia   . Iron deficiency anemia, unspecified 07/01/2013  . Memory loss   . PVD (peripheral vascular disease) ([place])   . Renal disorder   . Sleep apnea   . Vitamin D deficiency     Past Surgical History:  Procedure Laterality Date  . KNEE ARTHROSCOPY    . NO PAST SURGERIES    . TEE WITHOUT CARDIOVERSION N/A 12/24/2016   Procedure: TRANSESOPHAGEAL ECHOCARDIOGRAM (TEE);  Surgeon: Fay Records, MD;  Location: [facility];  Service: Cardiovascular;  Laterality: N/A;    There were no vitals filed for this visit.      Subjective Assessment - 02/22/17 0814    Subjective Pt brought in homework - planner, and worksheet he got last session   Currently in Pain? Yes   Pain Score 7    Pain Location Back   Pain Orientation Lower   Pain Descriptors / Indicators Pins and needles   Pain Type Chronic pain   Pain Radiating Towards LLE   Aggravating Factors  exercises?   Pain Relieving Factors meds, back brace               ADULT SLP  TREATMENT - 02/22/17 0821      General Information   Behavior/Cognition Alert;Cooperative;Pleasant mood     Treatment Provided   Treatment provided Cognitive-Linquistic     Cognitive-Linquistic Treatment   Treatment focused on Cognition   Skilled Treatment SLP heard pt say, "...but I misplaced it" 3 times in first 10 minutes of therapy so SLP reviewed memory tips - pt read each one and SLP gave examples for each one. SLP encouraged pt to use a basket by front door. SLP then worked directly on pt's attention and awareness with a functional math worksheet. Pt req'd min A occasionally for considering details. SLP provided pt with a Scientist, product/process development with "therapy papers" written on it in which pt to keep all ST, PT, OT paperwork.     Assessment / Recommendations / Plan   Plan Continue with current plan of care     Progression Toward Goals   Progression toward goals Progressing toward goals          SLP Education - 02/22/17 0922    Education provided Yes   Education Details memory compensations, "thearpy papers" folder   Person(s) Educated Patient   Methods Explanation;Handout;Demonstration   Comprehension Verbalized understanding  SLP Short Term Goals - 02/22/17 6237      SLP SHORT TERM GOAL #1   Title pt will demo emergent awareness 80% of the time with simple cognitive linguistic tasks using modified indpendence (rechecks) x3 sessions   Time 2   Period Weeks   Status On-going     SLP SHORT TERM GOAL #2   Title pt will double check (recheck) his work with rare min A to do so x3 sessions   Time 2   Period Weeks   Status On-going     SLP SHORT TERM GOAL #3   Title  pt will achieve 80% success with simple tasks involving time, money, calendar, etc. with rare min A over 3 sessions   Time 2   Period Weeks   Status On-going     SLP SHORT TERM GOAL #4   Title pt will tell SLP 4 memory strategies with rare min A x3 sessions   Time 2   Period Weeks   Status  On-going          SLP Long Term Goals - 02/22/17 6283      SLP LONG TERM GOAL #1   Title pt will demo anticipatory awareness with mod complex cognitive linguistic tasks by using modified indpendence (rechecks) x3 sessions   Time 6   Period Weeks   Status On-going     SLP LONG TERM GOAL #2   Title pt will use a memory strategy in 4 therapy sessions   Time 6   Period Weeks   Status On-going     SLP LONG TERM GOAL #3   Title pt will demo [facility] organziational skills for mod complex cognitive-linguistic tasks with rare min A x 3 sessions   Time 6   Period Weeks   Status On-going     SLP LONG TERM GOAL #4   Title pt will provide attention to detail adequate for finding errors in mod complex cognitive linguistic tasks with rare questioning cues x3 sessions   Time 6   Period Weeks   Status On-going          Plan - 02/22/17 1517    Clinical Impression Statement Pt presents today with cont'd mod 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 Compenstions were targeted in therapy as well as functional tasks for improvement of these deficis. Pt would cont to benefit from skilled ST to improve his cognitive communication skills to return to as close to PLOF as possible.   Speech Therapy Frequency 2x / week   Treatment/Interventions Compensatory techniques;Cognitive reorganization;Environmental controls;Internal/external aids;SLP instruction and feedback;Functional tasks;Cueing hierarchy;Patient/family education   Potential to Achieve Goals Good   Potential Considerations Severity of impairments   SLP Home Exercise Plan attention task, pt to purchase a day planner and bring to next session   Consulted and Agree with Plan of Care Patient      Patient will benefit from skilled therapeutic intervention in order to improve the  following deficits and impairments:   Cognitive communication deficit    Problem List Patient Active Problem List   Diagnosis Date Noted  . Left-sided weakness   . Chronic pain of left knee 12/22/2016  . Stroke ([place]) 12/22/2016  . Bradycardia 11/12/2016  . Generalized weakness 11/12/2016  . AKI (acute kidney injury) ([place]) 11/12/2016  . Hypophosphatemia 11/12/2016  . Leukocytosis 11/12/2016  . Diabetes mellitus with  complication ([place]) 80/99/8338  . At risk for falling 09/05/2015  . Chronic respiratory failure ([place]) 07/22/2015  . COLD (chronic obstructive lung disease) ([place]) 11/12/2014  . Dyspnea and respiratory abnormality 11/12/2014  . Lung nodule 04/02/2014  . Smoking 04/02/2014  . Hypokalemia 12/15/2013  . Diabetes mellitus, type 2 ([place]) 09/04/2013  . Essential (primary) hypertension 09/04/2013  . Absolute anemia 09/01/2013  . Chronic pain 08/16/2013  . CAFL (chronic airflow limitation) ([place]) 08/16/2013  . Chronic obstructive pulmonary disease ([place]) 08/16/2013  . Adiposity 08/11/2013  . Peripheral blood vessel disorder ([place]) 07/03/2013  . Cannot sleep 07/03/2013  . Peripheral vascular disease ([place]) 07/03/2013  . Iron deficiency anemia 07/01/2013  . Hypotestosteronism 07/01/2013  . 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 Compulsive tobacco user syndrome 06/01/2013  . Current tobacco use 06/01/2013  . Pulmonary nodule, right 9mm Aug 2014 03/25/2013  . Liver lesion 03/25/2013  . Abnormal magnetic resonance imaging study 03/21/2013  . Disease of liver 03/15/2013  . 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 Cancer screening 10/18/2012  . COPD (chronic obstructive pulmonary disease) ([place]) 08/11/2012  . Adverse reaction to bacterial vaccine 06/17/2012  . 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 Body mass index 27.0-27.9, adult 01/04/2012  . Emphysema 11/23/2011  . Chronic gout 11/23/2011  . Congestive heart failure ([place]) 11/23/2011  . Hypertension 11/23/2011   . Hyperlipidemia 11/23/2011  . Chronic bronchitis 11/23/2011  . Type 2 diabetes mellitus ([place]) 11/23/2011  . Chronic back pain 11/23/2011  . Osteoarthritis of left knee 11/23/2011  . Avitaminosis D 11/05/2011  . Gout 11/05/2011  . Cardiomyopathy, idiopathic ([place]) 11/04/2011  . Nondependent alcohol abuse 11/04/2011  . Amnesia 11/04/2011  . Acid reflux 11/04/2011  . Arthropathia 11/04/2011  . Primary cardiomyopathy ([place]) 11/04/2011    [facility] ,[place], [place]  02/22/2017, 9:25 AM  [facility] [address] [place], [place], [address] Phone: [phone]   Fax:  [phone]   Name: James Zuniga MRN: [id number] Date of Birth: January 03, 1956

## 2017-02-23 ENCOUNTER — Encounter: Payer: Self-pay | Admitting: Occupational Therapy

## 2017-02-23 ENCOUNTER — Ambulatory Visit: Payer: Self-pay

## 2017-02-23 DIAGNOSIS — I10 Essential (primary) hypertension: Secondary | ICD-10-CM | POA: Diagnosis not present

## 2017-02-23 DIAGNOSIS — E119 Type 2 diabetes mellitus without complications: Secondary | ICD-10-CM | POA: Diagnosis not present

## 2017-02-23 DIAGNOSIS — E785 Hyperlipidemia, unspecified: Secondary | ICD-10-CM | POA: Diagnosis not present

## 2017-02-23 DIAGNOSIS — J449 Chronic obstructive pulmonary disease, unspecified: Secondary | ICD-10-CM | POA: Diagnosis not present

## 2017-02-23 DIAGNOSIS — I639 Cerebral infarction, unspecified: Secondary | ICD-10-CM | POA: Diagnosis not present

## 2017-02-25 ENCOUNTER — Ambulatory Visit: Payer: PPO | Admitting: Speech Pathology

## 2017-02-25 ENCOUNTER — Ambulatory Visit: Payer: PPO

## 2017-02-25 ENCOUNTER — Encounter: Payer: Self-pay | Admitting: Occupational Therapy

## 2017-02-25 ENCOUNTER — Ambulatory Visit: Payer: PPO | Admitting: Occupational Therapy

## 2017-02-25 ENCOUNTER — Telehealth: Payer: Self-pay | Admitting: Internal Medicine

## 2017-02-25 DIAGNOSIS — I69954 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: Secondary | ICD-10-CM

## 2017-02-25 DIAGNOSIS — M6281 Muscle weakness (generalized): Secondary | ICD-10-CM | POA: Diagnosis not present

## 2017-02-25 DIAGNOSIS — R41841 Cognitive communication deficit: Secondary | ICD-10-CM

## 2017-02-25 DIAGNOSIS — R278 Other lack of coordination: Secondary | ICD-10-CM

## 2017-02-25 DIAGNOSIS — R208 Other disturbances of skin sensation: Secondary | ICD-10-CM

## 2017-02-25 DIAGNOSIS — R2689 Other abnormalities of gait and mobility: Secondary | ICD-10-CM

## 2017-02-25 DIAGNOSIS — I69915 Cognitive social or emotional deficit following unspecified cerebrovascular disease: Secondary | ICD-10-CM

## 2017-02-25 NOTE — Therapy (Signed)
James Zuniga [address] [place], [place], [address] Phone: [phone]   Fax:  [phone]  Physical Therapy Treatment  Patient Details  Name: [name] MRN: [id number] Date of Birth: 01-Sep-1955 Referring Provider: Eldridge Abrahams NP  Encounter Date: 02/25/2017      PT End of Session - 02/25/17 1030    Visit Number 4   Number of Visits 9   Date for PT Re-Evaluation 04/04/17   Authorization Type [employer]   Authorization Time Period 02/03/17 TO 04/04/17   PT Start Time 0932   PT Stop Time 1013   PT Time Calculation (min) 41 min   Equipment Utilized During Treatment Gait belt   Activity Tolerance Patient tolerated treatment well   Behavior During Therapy [employer] for tasks assessed/performed      Past Medical History:  Diagnosis Date  . CHF (congestive heart failure) ([place])   . Chronic airway obstruction ([place])   . Chronic pain   . Coronary artery disease   . Diabetes mellitus   . Disorder of liver   . Esophageal reflux   . Gout   . Hyperlipidemia   . Hypertension   . Hypokalemia   . Hypokalemia 11/2016  . Hypotestosteronism 07/01/2013  . Insomnia   . Iron deficiency anemia, unspecified 07/01/2013  . Memory loss   . PVD (peripheral vascular disease) ([place])   . Renal disorder   . Sleep apnea   . Vitamin D deficiency     Past Surgical History:  Procedure Laterality Date  . KNEE ARTHROSCOPY    . NO PAST SURGERIES    . TEE WITHOUT CARDIOVERSION N/A 12/24/2016   Procedure: TRANSESOPHAGEAL ECHOCARDIOGRAM (TEE);  Surgeon: James Records, MD;  Location: [facility];  Service: Cardiovascular;  Laterality: N/A;    There were no vitals filed for this visit.      Subjective Assessment - 02/25/17 0933    Subjective Pt denied falls or changes since last visit. Pt reported exercises are going better and not incr. back pain. Pt brought his knee braces today.   Pertinent History HTN, CHF EF 25-45%, DM,  back pain (uses TENS), COPD, tremor LUE more than RUE   Patient Stated Goals be able to strengthen lt knee so he can have knee surgery    Currently in Pain? Yes   Pain Score 7    Pain Location Back   Pain Orientation Lower   Pain Descriptors / Indicators Pins and needles   Pain Type Chronic pain   Pain Radiating Towards LLE   Pain Onset More than a month ago   Pain Frequency Constant   Aggravating Factors  the old exercises   Pain Relieving Factors medications, ice   Multiple Pain Sites Yes   Pain Score 6   Pain Location Knee   Pain Orientation Left;Right   Pain Descriptors / Indicators Sharp   Pain Type Chronic pain   Pain Onset More than a month ago   Pain Frequency Intermittent   Aggravating Factors  Extreme knee flex/ext   Pain Relieving Factors brace                         [facility] Adult PT Treatment/Exercise - 02/25/17 0936      Standardized Balance Assessment   Standardized Balance Assessment Berg Balance Test     Berg Balance Test   Sit to Stand Able to stand without using hands and stabilize independently   Standing  Unsupported Able to stand 2 minutes with supervision   Sitting with Back Unsupported but Feet Supported on Floor or Stool Able to sit safely and securely 2 minutes   Stand to Sit Sits safely with minimal use of hands   Transfers Able to transfer safely, definite need of hands   Standing Unsupported with Eyes Closed Able to stand 10 seconds safely   Standing Ubsupported with Feet Together Able to place feet together independently and stand 1 minute safely   From Standing, Reach Forward with Outstretched Arm Can reach forward >12 cm safely (5")  R and L UE reaches approx. 6-7"   From Standing Position, Pick up Object from Floor Unable to pick up and needs supervision   From Standing Position, Turn to Look Behind Over each Shoulder Looks behind one side only/other side shows less weight shift   Turn 360 Degrees Needs close supervision or  verbal cueing   Standing Unsupported, Alternately Place Feet on Step/Stool Needs assistance to keep from falling or unable to try   Standing Unsupported, One Foot in [place] to take small step independently and hold 30 seconds  L knee buckled at 25 seconds during large step   Standing on One Leg Tries to lift leg/unable to hold 3 seconds but remains standing independently  on RLE   Total Score 37     Exercises   Exercises Knee/Hip;Ankle     Knee/Hip Exercises: Standing   Other Standing Knee Exercises In // bars with and without BUE support and S for safety, cues and demo for technique. 4x10'/activity.     Ankle Exercises: Standing   Heel Raises 10 reps;2 seconds   Heel Raises Limitations Pt reported incr. LBP during standing heel/toe raises. x10 reps. Performed in // bars with BUE support.    Toe Raise 10 reps;2 seconds   Toe Raise Limitations See above. Performed with BUE support in // bars.   Heel Walk (Round Trip) x10'   Toe Walk (Round Trip) x10'           Self Care:     PT Education - 02/25/17 1029    Education provided Yes   Education Details PT educated/demo'd pt on how to properly donn B knees braces. Pt reported decr. B knee pain with braces donned, PT educated pt on wearing braces only when amb. longer distances.    Person(s) Educated Patient   Methods Explanation;Demonstration;Tactile cues;Verbal cues;Handout   Comprehension Returned demonstration;Verbalized understanding;Need further instruction             PT Long Term Goals - 02/03/17 1624      PT LONG TERM GOAL #1   Title Patient will be independent with HEP for strengthening and balance.    Time 4   Period Weeks   Status New   Target Date 03/05/17     PT LONG TERM GOAL #2   Title Patient  will be able to verbalize plan to continue exercise/activity post-discharge from PT.    Time 4   Period Weeks   Status New   Target Date 03/05/17     PT LONG TERM GOAL #3   Title Patient will agree to  orthotist consult to discuss possilble orthotics to reduce knee buckling and reduce fall risk.    Time 4   Period Weeks   Status New     PT LONG TERM GOAL #4   Title Patient will increase Berg score by 3 points compared to baseline to indicate lesser fall  risk.    Time 4   Period Weeks   Status New     PT LONG TERM GOAL #5   Title Patient can verbalize signs and symptoms of CVA and appropriate course of action.   Time 4   Period Weeks   Status New               Plan - 02/25/17 1031    Clinical Impression Statement Pt's BERG score indicates pt is at a high risk for falls. Pt tolerated seated toe/heel raises well vs. standing (incr. LBP). PT will provide pt with seated toe/heels raises next session, if pt reports he tolerated well after session. PT educated pt on proper B knee brace and to wear while amb. long distances to reduce pain, as pt was scheduled for TKA prior to CVA. Pt would continue to benefit from skilled PT to improve safety during functional mobilty.    Rehab Potential Good   Clinical Impairments Affecting Rehab Potential degree of back and knee pain   PT Frequency 2x / week   PT Duration 4 weeks   PT Treatment/Interventions ADLs/Self Care Home Management;Functional mobility training;Stair training;Gait training;DME Instruction;Therapeutic activities;Therapeutic exercise;Balance training;Neuromuscular re-education;Patient/family education;Orthotic Fit/Training   PT Next Visit Plan Begin to check goals and determine if pt renewal is appropriate. Add seated toe/heel raises to HEP, perform sidestep squat during PT only 2/2 compensation strategies. Trial weight shifting (lateral/ant/post) activities.    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 Left-sided weakness   . Chronic pain of left knee 12/22/2016  . Stroke ([place]) 12/22/2016  . Bradycardia 11/12/2016  . Generalized weakness 11/12/2016  . AKI (acute kidney injury) ([place]) 11/12/2016  . Hypophosphatemia 11/12/2016  . Leukocytosis 11/12/2016  . Diabetes mellitus with complication ([place]) 72/03/4708  . At risk for falling 09/05/2015  . Chronic respiratory failure ([place]) 07/22/2015  . COLD (chronic obstructive lung disease) ([place]) 11/12/2014  . Dyspnea and respiratory abnormality 11/12/2014  . Lung nodule 04/02/2014  . Smoking 04/02/2014  . Hypokalemia 12/15/2013  . Diabetes mellitus, type 2 ([place]) 09/04/2013  . Essential (primary) hypertension 09/04/2013  . Absolute anemia 09/01/2013  . Chronic pain 08/16/2013  . CAFL (chronic airflow limitation) ([place]) 08/16/2013  . Chronic obstructive pulmonary disease ([place]) 08/16/2013  . Adiposity 08/11/2013  . Peripheral blood vessel disorder ([place]) 07/03/2013  . Cannot sleep 07/03/2013  . Peripheral vascular disease ([place]) 07/03/2013  . Iron deficiency anemia 07/01/2013  . Hypotestosteronism 07/01/2013  . 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 Compulsive tobacco user syndrome 06/01/2013  . Current tobacco use 06/01/2013  . Pulmonary nodule, right 50mm Aug 2014 03/25/2013  . Liver lesion 03/25/2013  . Abnormal magnetic resonance imaging study 03/21/2013  . Disease of liver 03/15/2013  . 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 Cancer screening 10/18/2012  . COPD (chronic obstructive pulmonary disease) ([place]) 08/11/2012  . Adverse reaction to bacterial vaccine  06/17/2012  . 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 Body mass index 27.0-27.9, adult 01/04/2012  . Emphysema 11/23/2011  . Chronic gout 11/23/2011  . Congestive heart failure ([place]) 11/23/2011  . Hypertension 11/23/2011  . Hyperlipidemia 11/23/2011  . Chronic bronchitis 11/23/2011  . Type 2 diabetes mellitus ([place]) 11/23/2011  . Chronic back pain 11/23/2011  . Osteoarthritis of left knee 11/23/2011  . Avitaminosis D 11/05/2011  . Gout 11/05/2011  . Cardiomyopathy, idiopathic ([place]) 11/04/2011  . Nondependent alcohol abuse 11/04/2011  . Amnesia 11/04/2011  . Acid reflux 11/04/2011  . Arthropathia 11/04/2011  . Primary cardiomyopathy ([place]) 11/04/2011    Yun Gutierrez L 02/25/2017, 10:34 AM  [facility] [address] [place], [place], [address] Phone: [phone]   Fax:  [phone]  Name: James Zuniga MRN: [id number] Date of Birth: 11/25/1955  Geoffry Paradise, PT,DPT 02/25/17 10:35 AM Phone: [phone] Fax: [phone]

## 2017-02-25 NOTE — Telephone Encounter (Signed)
Called and spoke with Vernell Barrier at [facility]. Vernell Barrier states CMN and O2 saturations are needed, as it is time for pt to re qualify. I have asked that [facility] refax CMN, as it does not appear that we have received this. I have checked with Rodena Piety and RB's look at.  Pt last seen 08/17/16 and instructed to f/u in 64mo. Pt does not currently have pending apt. I have spoken with pt and made him aware of this matter. Pt states he will have to call back to schedule, as he recently had a stroke and is unable to drive. Pt states he will call back on 02/26/17, after discussing with his wife.

## 2017-02-25 NOTE — Therapy (Signed)
[facility] [address] [place], [place], [address] Phone: [phone]   Fax:  [phone]  Occupational Therapy Treatment  Patient Details  Name: James Zuniga MRN: [id number] Date of Birth: 06-29-1956 Referring Provider: Eldridge Abrahams  Encounter Date: 02/25/2017      OT End of Session - 02/25/17 0927    Visit Number 5   Number of Visits 9   Authorization Type [employer] $15 co pay, 0 visit limit   OT Start Time 0802   OT Stop Time 0841   OT Time Calculation (min) 39 min   Activity Tolerance Patient tolerated treatment well      Past Medical History:  Diagnosis Date  . CHF (congestive heart failure) ([place])   . Chronic airway obstruction ([place])   . Chronic pain   . Coronary artery disease   . Diabetes mellitus   . Disorder of liver   . Esophageal reflux   . Gout   . Hyperlipidemia   . Hypertension   . Hypokalemia   . Hypokalemia 11/2016  . Hypotestosteronism 07/01/2013  . Insomnia   . Iron deficiency anemia, unspecified 07/01/2013  . Memory loss   . PVD (peripheral vascular disease) ([place])   . Renal disorder   . Sleep apnea   . Vitamin D deficiency     Past Surgical History:  Procedure Laterality Date  . KNEE ARTHROSCOPY    . NO PAST SURGERIES    . TEE WITHOUT CARDIOVERSION N/A 12/24/2016   Procedure: TRANSESOPHAGEAL ECHOCARDIOGRAM (TEE);  Surgeon: Fay Records, MD;  Location: [facility];  Service: Cardiovascular;  Laterality: N/A;    There were no vitals filed for this visit.      Subjective Assessment - 02/25/17 0847    Subjective  I want to be able to drive but my speech therapist thinks my attention isn't good enough   Pertinent History see epic pt with R CVA   Currently in Pain? Yes   Pain Score 7    Pain Location Back   Pain Orientation Lower   Pain Descriptors / Indicators Pins and needles   Pain Type Chronic pain   Pain Onset More than a month ago   Pain Frequency Constant   Aggravating Factors  possibly exercises?  no sure - my back has always been bad some days are worse than others   Pain Relieving Factors meds, back brace   Multiple Pain Sites Yes   Pain Score 7   Pain Location Leg   Pain Orientation Right   Pain Descriptors / Indicators Aching   Pain Type Chronic pain   Pain Onset More than a month ago   Pain Frequency Intermittent   Aggravating Factors  walking too much - I was supposed to have surgery but then this happened.    Pain Relieving Factors brace                      OT Treatments/Exercises (OP) - 02/25/17 0001      ADLs   UB Dressing Pt demonstrated ability to button dress shirt with small buttons without diffculty.   ADL Comments Checked remaining goals - pt has met or exceeded all OT goals.  Pt stated "I knew my hand was better but I didn't realize I was doing that good. This therapy really helped me."     Fine Motor Coordination   In Hand Manipulation Training Addressed in hand manipulation with 3 small objects within functional  task - pt with improvement today in being able to manipulate 3 items in hand today.  Pt with only very minimal difficultly but did require some increased time. Pt is completing this task with his non dominant hand.                       OT Long Term Goals - Mar 08, 2017 0924      OT LONG TERM GOAL #1   Title Patient will complete a Home Exercise Program designed to improve strength in left hand   Time 4   Period Weeks   Status Achieved     OT LONG TERM GOAL #2   Title Patient will complete a home exercise program designed to improve coordination in left hand   Time 4   Period Weeks   Status Achieved     OT LONG TERM GOAL #3   Title Patient will safely carry open drink across the room (25+ feet) with left hand, while walking with right hand on cane.     Time 4   Period Weeks   Status Achieved  Able to carry 115 feet x2 without difficulty     OT LONG TERM GOAL #4   Title  Patient will demonstrate improved coordination for buttoning a dress shirt, as evidenced by reduced time for 9 hole peg test by 5 seconds.     Baseline 37.22 baseline - goal ~35 seconds   Time 4   Period Weeks   Status Achieved  32.51     OT LONG TERM GOAL #5   Title Patient will demonstrate improved strength to aide with opening twist top bottles, etc, as evidenced by improved grip strength by 10 lbs.     Time 4   Period Weeks   Status Achieved  75 pounds               Plan - 03-08-17 0932    Clinical Impression Statement Pt has met or exceeded all goals. Pt states he is very pleased with his progress.    Rehab Potential Good   OT Frequency 2x / week   OT Duration 4 weeks   OT Treatment/Interventions Self-care/ADL training;Therapeutic exercise;Functional Mobility Training;Patient/family education;Balance training;Neuromuscular education;Therapeutic activities;DME and/or AE instruction;Cognitive remediation/compensation;Visual/perceptual remediation/compensation   Plan d/c from OT   Consulted and Agree with Plan of Care Patient      Patient will benefit from skilled therapeutic intervention in order to improve the following deficits and impairments:  Cardiopulmonary status limiting activity, Decreased activity tolerance, Decreased balance, Decreased cognition, Decreased coordination, Decreased safety awareness, Decreased mobility, Decreased knowledge of precautions, Decreased endurance, Decreased strength, Difficulty walking, Increased edema, Impaired UE functional use, Impaired tone, Impaired sensation, Impaired flexibility, Impaired perceived functional ability, Impaired vision/preception, Pain  Visit Diagnosis: Muscle weakness (generalized)  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 (HCC)  Other lack of coordination  Cognitive social or emotional deficit following unspecified cerebrovascular disease  Other  disturbances of skin sensation      G-Codes - 03/08/2017 0928    Functional Assessment Tool Used (Outpatient only) 9 hole peg test, skilled clinical observation   Functional Limitation Carrying, moving and handling objects   Carrying, Moving and Handling Objects Current Status (T5573) At least 1 percent but less than 20 percent impaired, limited or restricted   Carrying, Moving and Handling Objects Goal Status (U2025) At least 20 percent but less than 40 percent impaired, limited or restricted   Carrying, Moving  and Handling Objects Discharge Status [phone]) At least 1 percent but less than 20 percent impaired, limited or restricted      Problem List Patient Active Problem List   Diagnosis Date Noted  . Left-sided weakness   . Chronic pain of left knee 12/22/2016  . Stroke ([place]) 12/22/2016  . Bradycardia 11/12/2016  . Generalized weakness 11/12/2016  . AKI (acute kidney injury) ([place]) 11/12/2016  . Hypophosphatemia 11/12/2016  . Leukocytosis 11/12/2016  . Diabetes mellitus with complication ([place]) 01/74/9449  . At risk for falling 09/05/2015  . Chronic respiratory failure ([place]) 07/22/2015  . COLD (chronic obstructive lung disease) ([place]) 11/12/2014  . Dyspnea and respiratory abnormality 11/12/2014  . Lung nodule 04/02/2014  . Smoking 04/02/2014  . Hypokalemia 12/15/2013  . Diabetes mellitus, type 2 ([place]) 09/04/2013  . Essential (primary) hypertension 09/04/2013  . Absolute anemia 09/01/2013  . Chronic pain 08/16/2013  . CAFL (chronic airflow limitation) ([place]) 08/16/2013  . Chronic obstructive pulmonary disease ([place]) 08/16/2013  . Adiposity 08/11/2013  . Peripheral blood vessel disorder ([place]) 07/03/2013  . Cannot sleep 07/03/2013  . Peripheral vascular disease ([place]) 07/03/2013  . Iron deficiency anemia 07/01/2013  . Hypotestosteronism 07/01/2013  . 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 Compulsive tobacco user syndrome 06/01/2013  . Current tobacco use  06/01/2013  . Pulmonary nodule, right 9m Aug 2014 03/25/2013  . Liver lesion 03/25/2013  . Abnormal magnetic resonance imaging study 03/21/2013  . Disease of liver 03/15/2013  . 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 Cancer screening 10/18/2012  . COPD (chronic obstructive pulmonary disease) ([place] 08/11/2012  . Adverse reaction to bacterial vaccine 06/17/2012  . 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 Body mass index 27.0-27.9, adult 01/04/2012  . Emphysema 11/23/2011  . Chronic gout 11/23/2011  . Congestive heart failure ([place] 11/23/2011  . Hypertension 11/23/2011  . Hyperlipidemia 11/23/2011  . Chronic bronchitis 11/23/2011  . Type 2 diabetes mellitus ([place] 11/23/2011  . Chronic back pain 11/23/2011  . Osteoarthritis of left knee 11/23/2011  . Avitaminosis D 11/05/2011  . Gout 11/05/2011  . Cardiomyopathy, idiopathic ([place] 11/04/2011  . Nondependent alcohol abuse 11/04/2011  . Amnesia 11/04/2011  . Acid reflux 11/04/2011  . Arthropathia 11/04/2011  . Primary cardiomyopathy ([place] 11/04/2011   OCCUPATIONAL THERAPY DISCHARGE SUMMARY  Visits from Start of Care: 5  Current functional level related to goals / functional outcomes: See above    Remaining deficits: Very mild LUE incoordination, decreased cognitive skills (see ST notes), decreased higher level balance (see PT notes)   Education / Equipment: HEP Plan: Patient agrees to discharge.  Patient goals were met. Patient is being discharged due to meeting the stated rehab goals.  ?????       PQuay Burow OTR/L 02/25/2017, 9:30 AM  [facility]SWay[facility]boro[place] [address]Phone: [phone]  Fax:  [phone] Name: LCHENG DECMRN: [id number] of Birth: [birth year]

## 2017-02-25 NOTE — Therapy (Signed)
[facility] [address] [place], [place], [address] Phone: [phone]   Fax:  [phone]  Speech Language Pathology Treatment  Patient Details  Name: James Zuniga MRN: [id number] Date of Birth: Feb 09, 1956 Referring Provider: Eldridge Abrahams, NP  Encounter Date: 02/25/2017      End of Session - 02/25/17 1138    Visit Number 5   Number of Visits 17   Date for SLP Re-Evaluation 04/09/17   SLP Start Time 0802   SLP Stop Time  0845   SLP Time Calculation (min) 43 min   Activity Tolerance Patient tolerated treatment well      Past Medical History:  Diagnosis Date  . CHF (congestive heart failure) ([place])   . Chronic airway obstruction ([place])   . Chronic pain   . Coronary artery disease   . Diabetes mellitus   . Disorder of liver   . Esophageal reflux   . Gout   . Hyperlipidemia   . Hypertension   . Hypokalemia   . Hypokalemia 11/2016  . Hypotestosteronism 07/01/2013  . Insomnia   . Iron deficiency anemia, unspecified 07/01/2013  . Memory loss   . PVD (peripheral vascular disease) ([place])   . Renal disorder   . Sleep apnea   . Vitamin D deficiency     Past Surgical History:  Procedure Laterality Date  . KNEE ARTHROSCOPY    . NO PAST SURGERIES    . TEE WITHOUT CARDIOVERSION N/A 12/24/2016   Procedure: TRANSESOPHAGEAL ECHOCARDIOGRAM (TEE);  Surgeon: Fay Records, MD;  Location: [facility];  Service: Cardiovascular;  Laterality: N/A;    There were no vitals filed for this visit.      Subjective Assessment - 02/25/17 0819    Subjective "The work you gave me last week was no problem. The man... that stuff he gave me... it's too hard."   Currently in Pain? Yes   Pain Score 7    Pain Location Back   Pain Orientation Lower   Pain Type Chronic pain   Pain Onset More than a month ago   Multiple Pain Sites Yes   Pain Location Leg   Pain Orientation Right;Left   Pain Descriptors / Indicators Aching   Pain Type Chronic pain   Pain Onset More than a month ago               ADULT SLP TREATMENT - 02/25/17 0800      General Information   Behavior/Cognition Alert;Cooperative   Patient Positioning Upright in chair     Treatment Provided   Treatment provided Cognitive-Linquistic     Cognitive-Linquistic Treatment   Treatment focused on Cognition   Skilled Treatment SLP facilitated task initiation for home tasks; pt required consistent mod cues for attention to detail, use of strategies for alternating attention, error awareness. Pt required frequent cues to write information vs. "keeping it in his head." SLP faded cues to recheck work to rare min A; pt made attempts to correct simple math, however he required mod A to do so accurately. Simple calendar task: pt required extended time for locating appropriate date, stated, "is it the 16th?" SLP educated pt re: checking off completed dates/activities, using a bookmark to improve calendar orientation. Pt required min cues to add details to his calendar notes to specify purpose, not just times of appointments.      Assessment / Recommendations / Plan   Plan Continue with current plan of care     Progression Toward  Goals   Progression toward goals Progressing toward goals          SLP Education - 02/25/17 1138    Education provided Yes   Education Details WARM strategies provided last session   Person(s) Educated Patient   Methods Explanation;Demonstration;Verbal cues   Comprehension Verbalized understanding;Returned demonstration;Verbal cues required;Need further instruction          SLP Short Term Goals - 02/25/17 1006      SLP SHORT TERM GOAL #1   Title pt will demo emergent awareness 80% of the time with simple cognitive linguistic tasks using modified indpendence (rechecks) x3 sessions   Time 2   Period Weeks   Status On-going     SLP SHORT TERM GOAL #2   Title pt will double check (recheck) his work with rare min A  to do so x3 sessions   Time 2   Period Weeks   Status On-going     SLP SHORT TERM GOAL #3   Title  pt will achieve 80% success with simple tasks involving time, money, calendar, etc. with rare min A over 3 sessions   Time 2   Period Weeks   Status On-going     SLP SHORT TERM GOAL #4   Title pt will tell SLP 4 memory strategies with rare min A x3 sessions   Time 2   Period Weeks   Status On-going          SLP Long Term Goals - 02/25/17 1018      SLP LONG TERM GOAL #1   Title pt will demo anticipatory awareness with mod complex cognitive linguistic tasks by using modified indpendence (rechecks) x3 sessions   Time 6   Period Weeks   Status On-going     SLP LONG TERM GOAL #2   Title pt will use a memory strategy in 4 therapy sessions   Time 6   Period Weeks   Status On-going     SLP LONG TERM GOAL #3   Title pt will demo [facility] organziational skills for mod complex cognitive-linguistic tasks with rare min A x 3 sessions   Time 6   Period Weeks   Status On-going     SLP LONG TERM GOAL #4   Title pt will provide attention to detail adequate for finding errors in mod complex cognitive linguistic tasks with rare questioning cues x3 sessions   Time 6   Period Weeks   Status On-going          Plan - 02/25/17 1144    Clinical Impression Statement Pt presents today with cont'd mod 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 Compenstions were targeted in therapy as well as functional tasks for improvement of these deficits. Pt stated he hopes to return to driving; SLP utilized pt's performance/ difficulty with simple menu task to demo his reduced attention and explained current attention deficits would impede his ability to drive safely. Pt would cont to benefit from skilled ST to improve his cognitive communication skills  to return to as close to PLOF as possible.      Patient will benefit from skilled therapeutic intervention in order to improve the following deficits and impairments:   Cognitive communication deficit    Problem List Patient Active Problem List   Diagnosis Date Noted  . Left-sided weakness   . Chronic pain of left knee  12/22/2016  . Stroke ([place]) 12/22/2016  . Bradycardia 11/12/2016  . Generalized weakness 11/12/2016  . AKI (acute kidney injury) ([place]) 11/12/2016  . Hypophosphatemia 11/12/2016  . Leukocytosis 11/12/2016  . Diabetes mellitus with complication ([place]) 63/87/5643  . At risk for falling 09/05/2015  . Chronic respiratory failure ([place]) 07/22/2015  . COLD (chronic obstructive lung disease) ([place]) 11/12/2014  . Dyspnea and respiratory abnormality 11/12/2014  . Lung nodule 04/02/2014  . Smoking 04/02/2014  . Hypokalemia 12/15/2013  . Diabetes mellitus, type 2 ([place]) 09/04/2013  . Essential (primary) hypertension 09/04/2013  . Absolute anemia 09/01/2013  . Chronic pain 08/16/2013  . CAFL (chronic airflow limitation) ([facility]) 08/16/2013  . Chronic obstructive pulmonary disease ([place]) 08/16/2013  . Adiposity 08/11/2013  . Peripheral blood vessel disorder ([place]) 07/03/2013  . Cannot sleep 07/03/2013  . Peripheral vascular disease ([facility]) 07/03/2013  . Iron deficiency anemia 07/01/2013  . Hypotestosteronism 07/01/2013  . 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 Compulsive tobacco user syndrome 06/01/2013  . Current tobacco use 06/01/2013  . Pulmonary nodule, right 52mm Aug 2014 03/25/2013  . Liver lesion 03/25/2013  . Abnormal magnetic resonance imaging study 03/21/2013  . Disease of liver 03/15/2013  . 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 Cancer screening 10/18/2012  . COPD (chronic obstructive pulmonary disease) ([place]) 08/11/2012  . Adverse reaction to bacterial vaccine 06/17/2012  . 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 Body mass index 27.0-27.9, adult  01/04/2012  . Emphysema 11/23/2011  . Chronic gout 11/23/2011  . Congestive heart failure ([place]) 11/23/2011  . Hypertension 11/23/2011  . Hyperlipidemia 11/23/2011  . Chronic bronchitis 11/23/2011  . Type 2 diabetes mellitus ([place]) 11/23/2011  . Chronic back pain 11/23/2011  . Osteoarthritis of left knee 11/23/2011  . Avitaminosis D 11/05/2011  . Gout 11/05/2011  . Cardiomyopathy, idiopathic ([place]) 11/04/2011  . Nondependent alcohol abuse 11/04/2011  . Amnesia 11/04/2011  . Acid reflux 11/04/2011  . Arthropathia 11/04/2011  . Primary cardiomyopathy ([place]) 11/04/2011   Deneise Lever, [place], [place] 02/25/2017, 11:46 AM  [facility] [address] [place], [place], [address] Phone: [phone]   Fax:  [phone]   Name: James Zuniga MRN: [id number] Date of Birth: August 27, 1955

## 2017-02-26 ENCOUNTER — Ambulatory Visit: Payer: Self-pay

## 2017-03-01 ENCOUNTER — Encounter: Payer: Self-pay | Admitting: Occupational Therapy

## 2017-03-01 ENCOUNTER — Ambulatory Visit: Payer: PPO | Admitting: Physical Therapy

## 2017-03-01 ENCOUNTER — Ambulatory Visit: Payer: PPO | Admitting: Speech Pathology

## 2017-03-01 DIAGNOSIS — M6281 Muscle weakness (generalized): Secondary | ICD-10-CM

## 2017-03-01 DIAGNOSIS — R2689 Other abnormalities of gait and mobility: Secondary | ICD-10-CM

## 2017-03-01 DIAGNOSIS — R41841 Cognitive communication deficit: Secondary | ICD-10-CM

## 2017-03-01 NOTE — Therapy (Signed)
[facility] [address] [place], [place], [address] Phone: [phone]   Fax:  [phone]  Speech Language Pathology Treatment  Patient Details  Name: James Zuniga MRN: [id number] Date of Birth: January 11, 1956 Referring Provider: Eldridge Abrahams, NP  Encounter Date: 03/01/2017      End of Session - 03/01/17 1607    Visit Number 6   Number of Visits 17   Date for SLP Re-Evaluation 04/09/17   SLP Start Time 0804   SLP Stop Time  0846   SLP Time Calculation (min) 42 min   Activity Tolerance Patient tolerated treatment well      Past Medical History:  Diagnosis Date  . CHF (congestive heart failure) ([facility])   . Chronic airway obstruction ([place])   . Chronic pain   . Coronary artery disease   . Diabetes mellitus   . Disorder of liver   . Esophageal reflux   . Gout   . Hyperlipidemia   . Hypertension   . Hypokalemia   . Hypokalemia 11/2016  . Hypotestosteronism 07/01/2013  . Insomnia   . Iron deficiency anemia, unspecified 07/01/2013  . Memory loss   . PVD (peripheral vascular disease) ([place])   . Renal disorder   . Sleep apnea   . Vitamin D deficiency     Past Surgical History:  Procedure Laterality Date  . KNEE ARTHROSCOPY    . NO PAST SURGERIES    . TEE WITHOUT CARDIOVERSION N/A 12/24/2016   Procedure: TRANSESOPHAGEAL ECHOCARDIOGRAM (TEE);  Surgeon: Fay Records, MD;  Location: [facility];  Service: Cardiovascular;  Laterality: N/A;    There were no vitals filed for this visit.      Subjective Assessment - 03/01/17 0808    Subjective "It was hard to me. My granddaughter had to help me." (re [facility])   Currently in Pain? Yes   Pain Score 7    Pain Location Back   Pain Orientation Lower   Pain Type Chronic pain   Pain Onset More than a month ago               ADULT SLP TREATMENT - 03/01/17 0809      General Information   Behavior/Cognition Alert;Cooperative   Patient Positioning Upright in  chair     Treatment Provided   Treatment provided Cognitive-Linquistic     Cognitive-Linquistic Treatment   Treatment focused on Cognition   Skilled Treatment SLP targeted simple calendar activity today; pt showed SLP 2 details he recorded about his weekend. With review of weekly planner, pt reviewed several notes he had written to himself re: appointments but was unable to decipher specifics as he had neglected to write specific information. SLP initially provided mod A, fading to occasional min A for pt to add salient details. Pt able to tell SLP 3/4 memory strategies today independently, with mod A for the 4th. Faciliated error awareness with review of pt's homework; he required mod A to identify and correct mistakes. Homework assigned.     Assessment / Recommendations / Plan   Plan Continue with current plan of care     Progression Toward Goals   Progression toward goals Progressing toward goals            SLP Short Term Goals - 03/01/17 0834      SLP SHORT TERM GOAL #1   Title pt will demo emergent awareness 80% of the time with simple cognitive linguistic tasks using modified indpendence (rechecks) x3 sessions  Time 1   Period Weeks   Status On-going     SLP SHORT TERM GOAL #2   Title pt will double check (recheck) his work with rare min A to do so x3 sessions   Time 1   Period Weeks   Status On-going     SLP SHORT TERM GOAL #3   Title  pt will achieve 80% success with simple tasks involving time, money, calendar, etc. with rare min A over 3 sessions   Time 1   Period Weeks     SLP SHORT TERM GOAL #4   Title pt will tell SLP 4 memory strategies with rare min A x3 sessions   Time 1   Period Weeks   Status On-going          SLP Long Term Goals - 03/01/17 1140      SLP LONG TERM GOAL #1   Title pt will demo anticipatory awareness with mod complex cognitive linguistic tasks by using modified indpendence (rechecks) x3 sessions   Time 5   Period Weeks   Status  On-going     SLP LONG TERM GOAL #2   Title pt will use a memory strategy in 4 therapy sessions   Time 5   Period Weeks   Status On-going     SLP LONG TERM GOAL #3   Title pt will demo [facility] organziational skills for mod complex cognitive-linguistic tasks with rare min A x 3 sessions   Time 5   Period Weeks   Status On-going     SLP LONG TERM GOAL #4   Title pt will provide attention to detail adequate for finding errors in mod complex cognitive linguistic tasks with rare questioning cues x3 sessions   Time 5   Period Weeks   Status On-going          Plan - 03/01/17 1607    Clinical Impression Statement Pt presents today with cont'd mod 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 Compenstions were targeted in therapy as well as functional tasks for improvement of these deficits. Pt required mod A today for simple calendar, time, money actitivies. Pt would cont to benefit from skilled ST to improve his cognitive communication skills to return to as close to PLOF as possible.   Speech Therapy Frequency 2x / week   Treatment/Interventions Compensatory techniques;Cognitive reorganization;Environmental controls;Internal/external aids;SLP instruction and feedback;Functional tasks;Cueing hierarchy;Patient/family education   Potential to Achieve Goals Good   Potential Considerations Severity of impairments   SLP Home Exercise Plan reviewed and provided   Consulted and Agree with Plan of Care Patient      Patient will benefit from skilled therapeutic intervention in order to improve the following deficits and impairments:   Cognitive communication deficit    Problem List Patient Active Problem List   Diagnosis Date Noted  . Left-sided weakness   . Chronic pain of left knee 12/22/2016  . Stroke ([place]) 12/22/2016  . Bradycardia  11/12/2016  . Generalized weakness 11/12/2016  . AKI (acute kidney injury) ([place]) 11/12/2016  . Hypophosphatemia 11/12/2016  . Leukocytosis 11/12/2016  . Diabetes mellitus with complication ([place]) 35/46/5681  . At risk for falling 09/05/2015  . Chronic respiratory failure ([place]) 07/22/2015  . COLD (chronic obstructive lung disease) ([place]) 11/12/2014  . Dyspnea and respiratory abnormality 11/12/2014  . Lung nodule 04/02/2014  . Smoking 04/02/2014  . Hypokalemia  12/15/2013  . Diabetes mellitus, type 2 ([place]) 09/04/2013  . Essential (primary) hypertension 09/04/2013  . Absolute anemia 09/01/2013  . Chronic pain 08/16/2013  . CAFL (chronic airflow limitation) ([place]) 08/16/2013  . Chronic obstructive pulmonary disease ([place]) 08/16/2013  . Adiposity 08/11/2013  . Peripheral blood vessel disorder ([place]) 07/03/2013  . Cannot sleep 07/03/2013  . Peripheral vascular disease ([place]) 07/03/2013  . Iron deficiency anemia 07/01/2013  . Hypotestosteronism 07/01/2013  . 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 Compulsive tobacco user syndrome 06/01/2013  . Current tobacco use 06/01/2013  . Pulmonary nodule, right 54mm Aug 2014 03/25/2013  . Liver lesion 03/25/2013  . Abnormal magnetic resonance imaging study 03/21/2013  . Disease of liver 03/15/2013  . 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 Cancer screening 10/18/2012  . COPD (chronic obstructive pulmonary disease) ([place]) 08/11/2012  . Adverse reaction to bacterial vaccine 06/17/2012  . 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 Body mass index 27.0-27.9, adult 01/04/2012  . Emphysema 11/23/2011  . Chronic gout 11/23/2011  . Congestive heart failure ([place]) 11/23/2011  . Hypertension 11/23/2011  . Hyperlipidemia 11/23/2011  . Chronic bronchitis 11/23/2011  . Type 2 diabetes mellitus ([place]) 11/23/2011  . Chronic back pain 11/23/2011  . Osteoarthritis of left knee 11/23/2011  . Avitaminosis D 11/05/2011  . Gout 11/05/2011  .  Cardiomyopathy, idiopathic ([place]) 11/04/2011  . Nondependent alcohol abuse 11/04/2011  . Amnesia 11/04/2011  . Acid reflux 11/04/2011  . Arthropathia 11/04/2011  . Primary cardiomyopathy ([place]) 11/04/2011   Deneise Lever, [place], [place] 03/01/2017, 4:16 PM  [facility] [address] [place], [place], [address] Phone: [phone]   Fax:  [phone]   Name: DAYLN TUGWELL MRN: [id number] Date of Birth: August 26, 1955

## 2017-03-01 NOTE — Patient Instructions (Addendum)
Toe Up    Gently rise up on toes and back on heels. Repeat _10___ times. Hold each position 3 seconds.  Do _2 sets, 1-2___ sessions per day.  http://gt2.exer.us/455   Copyright  VHI. All rights reserved.

## 2017-03-01 NOTE — Telephone Encounter (Signed)
[facility] pharm and LM with refill with instructions for patient to f/u in office with Marlou Sa.

## 2017-03-01 NOTE — Therapy (Signed)
[facility] [address] [place], [place], [address] Phone: [phone]   Fax:  [phone]  Physical Therapy Treatment  Patient Details  Name: James Zuniga MRN: [id number] Date of Birth: [birth year] Referring Provider: Eldridge Abrahams NP  Encounter Date: 03/01/2017      PT End of Session - 03/01/17 1508    Visit Number 5   Number of Visits 9   Date for PT Re-Evaluation 04/04/17   Authorization Type [employer]   Authorization Time Period 02/03/17 TO 04/04/17   PT Start Time 0851   PT Stop Time 0931   PT Time Calculation (min) 40 min   Equipment Utilized During Treatment Gait belt   Activity Tolerance Patient tolerated treatment well   Behavior During Therapy [facility] for tasks assessed/performed      Past Medical History:  Diagnosis Date  . CHF (congestive heart failure) ([place])   . Chronic airway obstruction ([place])   . Chronic pain   . Coronary artery disease   . Diabetes mellitus   . Disorder of liver   . Esophageal reflux   . Gout   . Hyperlipidemia   . Hypertension   . Hypokalemia   . Hypokalemia 11/2016  . Hypotestosteronism 07/01/2013  . Insomnia   . Iron deficiency anemia, unspecified 07/01/2013  . Memory loss   . PVD (peripheral vascular disease) ([place])   . Renal disorder   . Sleep apnea   . Vitamin D deficiency     Past Surgical History:  Procedure Laterality Date  . KNEE ARTHROSCOPY    . NO PAST SURGERIES    . TEE WITHOUT CARDIOVERSION N/A 12/24/2016   Procedure: TRANSESOPHAGEAL ECHOCARDIOGRAM (TEE);  Surgeon: Fay Records, MD;  Location: [facility];  Service: Cardiovascular;  Laterality: N/A;    There were no vitals filed for this visit.      Subjective Assessment - 03/01/17 0854    Subjective No changes, no falls since last visit.  Didn't wear the knee braces today.  Legs were swollen after using them the last visit.  I'll put them on when I get home.   Pertinent History HTN,  CHF EF 25-45%, DM, back pain (uses TENS), COPD, tremor LUE more than RUE   Patient Stated Goals be able to strengthen lt knee so he can have knee surgery    Currently in Pain? Yes   Pain Score 7    Pain Location Back   Pain Orientation Lower   Pain Descriptors / Indicators Pins and needles   Pain Type Chronic pain   Pain Onset More than a month ago   Pain Frequency Constant   Aggravating Factors  exercise or overdoing it   Pain Relieving Factors back brace, TENS, ice packs   Multiple Pain Sites Yes   Pain Score 6   Pain Location Knee   Pain Orientation Right;Left   Pain Descriptors / Indicators Sharp   Pain Type Chronic pain   Pain Onset More than a month ago   Pain Frequency Intermittent   Aggravating Factors  extreme knee flex/ext   Pain Relieving Factors brace                         [facility] Adult PT Treatment/Exercise - 03/01/17 0902      High Level Balance   High Level Balance Comments Lateral weightshifting 2 sets x 10 reps, with cues for improved weightshift and activation of stance leg; stagger stance forward/back  weightshifting x 10 reps each foot position (pt has increased difficulty shifting weigth anteriorly over RLE).  Forward/back walking in parallel bars x 3 reps, then marching forward/back x 1 rep (15 ft each); tandem stance 3 reps x 10 seconds with light bilateral UE support     Exercises   Exercises Knee/Hip;Ankle     Knee/Hip Exercises: Standing   Functional Squat 1 set;5 reps  UE support at counter   Other Standing Knee Exercises At counter:  sidestepping 3 reps R and L along counter, then sidestep squats R and L one rep (12 ft each direction)   Other Standing Knee Exercises Alternating step taps forward to 6" step, x 10 reps, then side to 6" step, x 10 reps with 1 UE support.     Ankle Exercises: Seated   Heel Raises 10 reps;3 seconds  2 sets   Toe Raise 10 reps;3 seconds  2 sets     With standing balance/SLS activities, at steps:  Cues  provided for upright posture and lessening of UE support, full knee extension. With anterior/posterior weightshifting activities, pt needs tactile cues and verbal cues for increased anterior weightshift, placing full foot flat on ground (RLE) -SLS, 3 reps 10 seconds holding to stair rails, cues to avoid knee flexion with standing.             PT Education - 03/01/17 1507    Education provided Yes   Education Details seated heel/toe raises added to HEP   Person(s) Educated Patient   Methods Explanation;Demonstration;Handout   Comprehension Verbalized understanding;Returned demonstration             PT Long Term Goals - 03/01/17 0859      PT LONG TERM GOAL #1   Title Patient will be independent with HEP for strengthening and balance.    Time 4   Period Weeks   Status New     PT LONG TERM GOAL #2   Title Patient  will be able to verbalize plan to continue exercise/activity post-discharge from PT.    Time 4   Period Weeks   Status New     PT LONG TERM GOAL #3   Title Patient will agree to orthotist consult to discuss possilble orthotics to reduce knee buckling and reduce fall risk.    Time 4   Period Weeks   Status New     PT LONG TERM GOAL #4   Title Patient will increase Berg score by 3 points compared to baseline to indicate lesser fall risk.    Time 4   Period Weeks   Status New     PT LONG TERM GOAL #5   Title Patient can verbalize signs and symptoms of CVA and appropriate course of action.   Time 4   Period Weeks   Status New               Plan - 03/01/17 1508    Clinical Impression Statement Pt does not wear knee braces to therapy today, but does not c/o any additional pain in low back or knees during session.  Addressed lower extremity strengthenging and balance activities.  Pt continues to demo decreased knee stability with SLS and tandem stance activities, needing UE support for stability.  (Did not assess goals today, as this is actually week  3 of 4 in POC-will begin to assess goals/discuss plans for continued therapy next visit or next week)   Rehab Potential Good   Clinical Impairments Affecting Rehab Potential  degree of back and knee pain   PT Frequency 2x / week   PT Duration 4 weeks   PT Treatment/Interventions ADLs/Self Care Home Management;Functional mobility training;Stair training;Gait training;DME Instruction;Therapeutic activities;Therapeutic exercise;Balance training;Neuromuscular re-education;Patient/family education;Orthotic Fit/Training   PT Next Visit Plan Review seated toe/heel raises added to HEP; continue to work on SLS, tandem stance and balance, weightshifting activities    Consulted and Agree with Plan of Care Patient;Family member/caregiver   Family Member Consulted --      Patient will benefit from skilled therapeutic intervention in order to improve the following deficits and impairments:  Abnormal gait, Cardiopulmonary status limiting activity, Decreased activity tolerance, Decreased balance, Decreased coordination, Decreased endurance, Decreased knowledge of use of DME, Decreased mobility, Decreased strength, Difficulty walking, Obesity, Pain  Visit Diagnosis: Muscle weakness (generalized)  Other abnormalities of gait and mobility     Problem List Patient Active Problem List   Diagnosis Date Noted  . Left-sided weakness   . Chronic pain of left knee 12/22/2016  . Stroke ([place]) 12/22/2016  . Bradycardia 11/12/2016  . Generalized weakness 11/12/2016  . AKI (acute kidney injury) ([place]) 11/12/2016  . Hypophosphatemia 11/12/2016  . Leukocytosis 11/12/2016  . Diabetes mellitus with complication ([place]) 63/78/5885  . At risk for falling 09/05/2015  . Chronic respiratory failure ([place]) 07/22/2015  . COLD (chronic obstructive lung disease) ([place]) 11/12/2014  . Dyspnea and respiratory abnormality 11/12/2014  . Lung nodule 04/02/2014  . Smoking 04/02/2014  . Hypokalemia 12/15/2013  . Diabetes mellitus,  type 2 ([place]) 09/04/2013  . Essential (primary) hypertension 09/04/2013  . Absolute anemia 09/01/2013  . Chronic pain 08/16/2013  . CAFL (chronic airflow limitation) ([place]) 08/16/2013  . Chronic obstructive pulmonary disease ([place]) 08/16/2013  . Adiposity 08/11/2013  . Peripheral blood vessel disorder ([place]) 07/03/2013  . Cannot sleep 07/03/2013  . Peripheral vascular disease ([place]) 07/03/2013  . Iron deficiency anemia 07/01/2013  . Hypotestosteronism 07/01/2013  . 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 Compulsive tobacco user syndrome 06/01/2013  . Current tobacco use 06/01/2013  . Pulmonary nodule, right 69mm Aug 2014 03/25/2013  . Liver lesion 03/25/2013  . Abnormal magnetic resonance imaging study 03/21/2013  . Disease of liver 03/15/2013  . 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 Cancer screening 10/18/2012  . COPD (chronic obstructive pulmonary disease) ([place]) 08/11/2012  . Adverse reaction to bacterial vaccine 06/17/2012  . 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 Body mass index 27.0-27.9, adult 01/04/2012  . Emphysema 11/23/2011  . Chronic gout 11/23/2011  . Congestive heart failure ([place]) 11/23/2011  . Hypertension 11/23/2011  . Hyperlipidemia 11/23/2011  . Chronic bronchitis 11/23/2011  . Type 2 diabetes mellitus ([place]) 11/23/2011  . Chronic back pain 11/23/2011  . Osteoarthritis of left knee 11/23/2011  . Avitaminosis D 11/05/2011  . Gout 11/05/2011  . Cardiomyopathy, idiopathic ([place]) 11/04/2011  . Nondependent alcohol abuse 11/04/2011  . Amnesia 11/04/2011  . Acid reflux 11/04/2011  . Arthropathia 11/04/2011  . Primary cardiomyopathy ([place]) 11/04/2011    MARRIOTT,AMY W. 03/01/2017, 3:13 PM  Frazier Butt., PT   [facility] [address] [place], [place], [address] Phone: [phone]   Fax:  [phone]  Name: WADDELL ITEN MRN: [id number] Date of Birth: 1956-06-27

## 2017-03-02 NOTE — Telephone Encounter (Signed)
This can be handled by APP. Pleas make visit with APP  Dr. Brand Males, M.D., [facility].C.P Pulmonary and Critical Care Medicine Staff Physician [facility] Pulmonary [facility] Critical Care Pager: [phone], If no answer or between  15:00h - 7:00h: call [phone]  03/02/2017 5:06 PM

## 2017-03-02 NOTE — Telephone Encounter (Signed)
Called and spoke with pt and offered appt that is open on Friday and he stated that he cannot come at that time due to his therapy appt. He could come Wednesday or Thursday but no openings.  MR and EE please advise. Thanks

## 2017-03-02 NOTE — Telephone Encounter (Signed)
lmtcb X1 for pt to schedule OV with a NP.

## 2017-03-05 ENCOUNTER — Ambulatory Visit: Payer: PPO

## 2017-03-05 DIAGNOSIS — M6281 Muscle weakness (generalized): Secondary | ICD-10-CM | POA: Diagnosis not present

## 2017-03-05 DIAGNOSIS — I69954 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: Secondary | ICD-10-CM

## 2017-03-05 DIAGNOSIS — R2689 Other abnormalities of gait and mobility: Secondary | ICD-10-CM

## 2017-03-05 DIAGNOSIS — R278 Other lack of coordination: Secondary | ICD-10-CM

## 2017-03-05 DIAGNOSIS — R41841 Cognitive communication deficit: Secondary | ICD-10-CM

## 2017-03-05 NOTE — Therapy (Addendum)
[facility] [address] [place], [place], [address] Phone: [phone]   Fax:  [phone]  Physical Therapy Treatment  Patient Details  Name: James Zuniga MRN: [id number] Date of Birth: Mar 10, 1956 Referring Provider: Eldridge Abrahams NP  Encounter Date: 03/05/2017      PT End of Session - 03/05/17 1039    Visit Number 6   Number of Visits 9   Date for PT Re-Evaluation 04/04/17   Authorization Type [employer]   Authorization Time Period 02/03/17 TO 04/04/17   PT Start Time 0849  with speech   PT Stop Time 0929   PT Time Calculation (min) 40 min   Equipment Utilized During Treatment Gait belt   Activity Tolerance Patient tolerated treatment well   Behavior During Therapy [facility] for tasks assessed/performed      Past Medical History:  Diagnosis Date  . CHF (congestive heart failure) ([place])   . Chronic airway obstruction ([place])   . Chronic pain   . Coronary artery disease   . Diabetes mellitus   . Disorder of liver   . Esophageal reflux   . Gout   . Hyperlipidemia   . Hypertension   . Hypokalemia   . Hypokalemia 11/2016  . Hypotestosteronism 07/01/2013  . Insomnia   . Iron deficiency anemia, unspecified 07/01/2013  . Memory loss   . PVD (peripheral vascular disease) ([place])   . Renal disorder   . Sleep apnea   . Vitamin D deficiency     Past Surgical History:  Procedure Laterality Date  . KNEE ARTHROSCOPY    . NO PAST SURGERIES    . TEE WITHOUT CARDIOVERSION N/A 12/24/2016   Procedure: TRANSESOPHAGEAL ECHOCARDIOGRAM (TEE);  Surgeon: Fay Records, MD;  Location: [facility];  Service: Cardiovascular;  Laterality: N/A;    There were no vitals filed for this visit.      Subjective Assessment - 03/05/17 0852    Subjective Pt denied falls since last visit. Pt didn't wear braces today, as he was running late. Pt forgot that I assisted him with both braces a few sessions ago and reported his leg  was swollen after wearing brace. Pt reports he plans to continue to exercise (perform HEP) after PT is finished, pt plans to go back to [employer] after PT POC completed. Pt able to verbalize some s/s of CVA, but required cues for all s/s.   Pertinent History HTN, CHF EF 25-45%, DM, back pain (uses TENS), COPD, tremor LUE more than RUE   Patient Stated Goals be able to strengthen lt knee so he can have knee surgery    Currently in Pain? Yes   Pain Score 7    Pain Location Back   Pain Orientation Left;Lower   Pain Descriptors / Indicators Pins and needles   Pain Type Chronic pain   Pain Onset More than a month ago   Pain Frequency Constant   Aggravating Factors  overdoing it and rainy weather, too much standing or sitting   Pain Relieving Factors back brace, ice packs, TENS                         [facility] Adult PT Treatment/Exercise - 03/05/17 0900      Ambulation/Gait   Ambulation/Gait Yes   Ambulation/Gait Assistance 4: Min guard;5: Supervision   Ambulation/Gait Assistance Details Pt amb. over even/uneven surface with SPC. Cues for improved narrow BOS (especially during turns), improve sequencing with SPC,  and weight shifting over inclines/declines and for safety awareness [facility] almost went off curb). Pt required one standing rest break during amb. 2/2 fatigue.    Ambulation Distance (Feet) 600 Feet   Assistive device Straight cane   Gait Pattern Step-through pattern;Decreased step length - right;Decreased stance time - left;Decreased weight shift to left;Wide base of support   Ambulation Surface Level;Unlevel;Indoor;Outdoor;Paved;Grass;Gravel;Other (comment)  rubber mulch     Standardized Balance Assessment   Standardized Balance Assessment Berg Balance Test     Berg Balance Test   Sit to Stand Able to stand without using hands and stabilize independently   Standing Unsupported Able to stand safely 2 minutes   Sitting with Back Unsupported but Feet Supported on  Floor or Stool Able to sit safely and securely 2 minutes   Stand to Sit Sits safely with minimal use of hands   Transfers Able to transfer safely, minor use of hands   Standing Unsupported with Eyes Closed Able to stand 10 seconds safely   Standing Ubsupported with Feet Together Able to place feet together independently and stand 1 minute safely   From Standing, Reach Forward with Outstretched Arm Can reach forward >12 cm safely (5")  RUE 9"   From Standing Position, Pick up Object from Floor Unable to pick up and needs supervision  limited by LBP and B knee pain   From Standing Position, Turn to Look Behind Over each Shoulder Looks behind from both sides and weight shifts well   Turn 360 Degrees Able to turn 360 degrees safely but slowly   Standing Unsupported, Alternately Place Feet on Step/Stool Able to stand independently and safely and complete 8 steps in 20 seconds   Standing Unsupported, One Foot in Front Able to plae foot ahead of the other independently and hold 30 seconds   Standing on One Leg Able to lift leg independently and hold > 10 seconds  RLE   Total Score 49           Self Care:     PT Education - 03/05/17 1038    Education provided Yes   Education Details PT discussed goal progress and outcome measure results, and likely renewal to continue to address impairments. PT discussed the importance of reviewing CVA s/s and risk factor handout to reduce risk of additional CVA. Pt verbalized understanding of plans to resume gym activiites after PT is completed but must wait to ensure safety.   Person(s) Educated Patient   Methods Explanation   Comprehension Verbalized understanding             PT Long Term Goals - 03/05/17 1041      PT LONG TERM GOAL #1   Title Patient will be independent with HEP for strengthening and balance.    Time 4   Period Weeks   Status New     PT LONG TERM GOAL #2   Title Patient  will be able to verbalize plan to continue  exercise/activity post-discharge from PT.    Time 4   Period Weeks   Status Achieved     PT LONG TERM GOAL #3   Title Patient will agree to orthotist consult to discuss possilble orthotics to reduce knee buckling and reduce fall risk.    Time 4   Period Weeks   Status Deferred     PT LONG TERM GOAL #4   Title Patient will increase Berg score to >/=52/56 points compared to reduce falls risk.   Baseline Revised on  03/05/17 as pt scored 49/56   Time 4   Period Weeks   Status Revised     PT LONG TERM GOAL #5   Title Patient can verbalize signs and symptoms of CVA and appropriate course of action.   Time 4   Period Weeks   Status Partially Met               Plan - 03/05/17 1039    Clinical Impression Statement Pt demonstrated progress, as he met LTGs 2 and 4. Pt's BERG score indicates pt is at moderate risk for falls. Pt partially met LTG 5. LTG 3 deferred, as pt will likely renew for additonal PT to address remaining deficits. PT will finish assessing remaining goals next session (LTG 1-HEP), and likely renew, as pt would benefit from continued skilled PT to improve safety during functional mobility. Pt required less rest breaks today, as pt reported pain did not incr. During session.   Rehab Potential Good   Clinical Impairments Affecting Rehab Potential degree of back and knee pain   PT Frequency 2x / week   PT Duration 4 weeks   PT Treatment/Interventions ADLs/Self Care Home Management;Functional mobility training;Stair training;Gait training;DME Instruction;Therapeutic activities;Therapeutic exercise;Balance training;Neuromuscular re-education;Patient/family education;Orthotic Fit/Training   PT Next Visit Plan Finish assessing LTG 1 (HEP) and renew. Then Review seated toe/heel raises added to HEP; continue to work on SLS, tandem stance and balance, weightshifting activities    Consulted and Agree with Plan of Care Patient;Family member/caregiver      Patient will benefit  from skilled therapeutic intervention in order to improve the following deficits and impairments:  Abnormal gait, Cardiopulmonary status limiting activity, Decreased activity tolerance, Decreased balance, Decreased coordination, Decreased endurance, Decreased knowledge of use of DME, Decreased mobility, Decreased strength, Difficulty walking, Obesity, Pain  Visit Diagnosis: Other abnormalities of gait and mobility  Muscle weakness (generalized)  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 Left-sided weakness   . Chronic pain of left knee 12/22/2016  . Stroke ([place]) 12/22/2016  . Bradycardia 11/12/2016  . Generalized weakness 11/12/2016  . AKI (acute kidney injury) ([place]) 11/12/2016  . Hypophosphatemia 11/12/2016  . Leukocytosis 11/12/2016  . Diabetes mellitus with complication ([place]) 45/36/4680  . At risk for falling 09/05/2015  . Chronic respiratory failure ([place]) 07/22/2015  . COLD (chronic obstructive lung disease) ([place]) 11/12/2014  . Dyspnea and respiratory abnormality 11/12/2014  . Lung nodule 04/02/2014  . Smoking 04/02/2014  . Hypokalemia 12/15/2013  . Diabetes mellitus, type 2 ([place]) 09/04/2013  . Essential (primary) hypertension 09/04/2013  . Absolute anemia 09/01/2013  . Chronic pain 08/16/2013  . CAFL (chronic airflow limitation) ([place]) 08/16/2013  . Chronic obstructive pulmonary disease ([place]) 08/16/2013  . Adiposity 08/11/2013  . Peripheral blood vessel disorder ([place]) 07/03/2013  . Cannot sleep 07/03/2013  . Peripheral vascular disease ([place]) 07/03/2013  . Iron deficiency anemia 07/01/2013  . Hypotestosteronism 07/01/2013  . 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 Compulsive tobacco user syndrome 06/01/2013  . Current tobacco use 06/01/2013  . Pulmonary nodule, right 79m Aug 2014  03/25/2013  . Liver lesion 03/25/2013  . Abnormal magnetic resonance imaging study 03/21/2013  . Disease of liver 03/15/2013  . 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 Cancer screening 10/18/2012  . COPD (chronic obstructive pulmonary disease) ([place] 08/11/2012  . Adverse reaction to bacterial vaccine 06/17/2012  .  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 Body mass index 27.0-27.9, adult 01/04/2012  . Emphysema 11/23/2011  . Chronic gout 11/23/2011  . Congestive heart failure ([place]) 11/23/2011  . Hypertension 11/23/2011  . Hyperlipidemia 11/23/2011  . Chronic bronchitis 11/23/2011  . Type 2 diabetes mellitus ([place]) 11/23/2011  . Chronic back pain 11/23/2011  . Osteoarthritis of left knee 11/23/2011  . Avitaminosis D 11/05/2011  . Gout 11/05/2011  . Cardiomyopathy, idiopathic ([place]) 11/04/2011  . Nondependent alcohol abuse 11/04/2011  . Amnesia 11/04/2011  . Acid reflux 11/04/2011  . Arthropathia 11/04/2011  . Primary cardiomyopathy ([place]) 11/04/2011    Katreena Schupp L 03/05/2017, 10:46 AM  [facility] [address] [place], [place], [address] Phone: [phone]   Fax:  [phone]  Name: BRYNDEN THUNE MRN: [id number] Date of Birth: 09-27-1955  Geoffry Paradise, PT,DPT 03/05/17 10:46 AM Phone: [phone] Fax: [phone]

## 2017-03-05 NOTE — Telephone Encounter (Signed)
lmtcb x2 for pt. 

## 2017-03-05 NOTE — Therapy (Signed)
[facility] [address] [place], [place], [address] Phone: [phone]   Fax:  [phone]  Speech Language Pathology Treatment  Patient Details  Name: James Zuniga MRN: [id number] Date of Birth: [birth year] Referring Provider: Eldridge Abrahams, NP  Encounter Date: 03/05/2017      End of Session - 03/05/17 0842    Visit Number 7   Number of Visits 17   Date for SLP Re-Evaluation 04/09/17   SLP Start Time 0810  pt used restroom at 0806   SLP Stop Time  0846   SLP Time Calculation (min) 36 min   Activity Tolerance Patient tolerated treatment well      Past Medical History:  Diagnosis Date  . CHF (congestive heart failure) ([place])   . Chronic airway obstruction ([place])   . Chronic pain   . Coronary artery disease   . Diabetes mellitus   . Disorder of liver   . Esophageal reflux   . Gout   . Hyperlipidemia   . Hypertension   . Hypokalemia   . Hypokalemia 11/2016  . Hypotestosteronism 07/01/2013  . Insomnia   . Iron deficiency anemia, unspecified 07/01/2013  . Memory loss   . PVD (peripheral vascular disease) ([place])   . Renal disorder   . Sleep apnea   . Vitamin D deficiency     Past Surgical History:  Procedure Laterality Date  . KNEE ARTHROSCOPY    . NO PAST SURGERIES    . TEE WITHOUT CARDIOVERSION N/A 12/24/2016   Procedure: TRANSESOPHAGEAL ECHOCARDIOGRAM (TEE);  Surgeon: Fay Records, MD;  Location: [facility];  Service: Cardiovascular;  Laterality: N/A;    There were no vitals filed for this visit.      Subjective Assessment - 03/05/17 0812    Subjective "I was rushing this morning and I grabbed the wrong folder - I brought a bunch of bills and papers. Sorry."               ADULT SLP TREATMENT - 03/05/17 6962      General Information   Behavior/Cognition Alert;Cooperative;Pleasant mood     Pain Assessment   Pain Assessment 0-10   Pain Score 7    Pain Location lower back shoots  down leg   Pain Descriptors / Indicators Pins and needles   Pain Intervention(s) Monitored during session     Cognitive-Linquistic Treatment   Treatment focused on Cognition   Skilled Treatment Pt did not bring planner/homework. Pt did not recall without cues that he wrote nonspecific notes, but did recall what SLP told pt to write down. - things that don't necessarily happen everyday. Pt reasoned/problem solved with SLP who led the task; pt req'd min-mod verbal cues for the best way to ensure he got the correct folder hear next session. He recalled 2/4 memory strategies (i.e.,WARM) with min A, and 4/4 with mod-max verbal cues. SLP reiterated to pt to make things as simple as possible to recall and to remember.      Assessment / Recommendations / Plan   Plan Continue with current plan of care     Progression Toward Goals   Progression toward goals Goals downgraded          SLP Education - 03/05/17 0840    Education provided Yes   Education Details WARM memory strategies, ways to ensure pt brings correct notebook/brings notebook   Person(s) Educated Patient   Methods Explanation;Verbal cues   Comprehension Verbalized understanding  SLP Short Term Goals - 03/05/17 0948      SLP SHORT TERM GOAL #1   Title pt will demo emergent awareness 80% of the time with simple cognitive linguistic tasks using modified indpendence (rechecks) x3 sessions   Status Not Met     SLP SHORT TERM GOAL #2   Title pt will double check (recheck) his work with rare min A to do so x3 sessions   Status Not Met     SLP SHORT TERM GOAL #3   Title  pt will achieve 80% success with simple tasks involving time, money, calendar, etc. with rare min A over 3 sessions   Status Not Met     SLP SHORT TERM GOAL #4   Title pt will tell SLP 4 memory strategies with rare min A x3 sessions   Status Partially Met  2/4          SLP Long Term Goals - 03/05/17 0949      SLP LONG TERM GOAL #1   Title pt  will demo anticipatory awareness with min-mod complex cognitive linguistic tasks by using modified indpendence (rechecks) x3 sessions   Time 5   Period Weeks   Status Revised     SLP LONG TERM GOAL #2   Title pt will use a memory strategy in 3 therapy sessions   Time 5   Period Weeks   Status Revised     SLP LONG TERM GOAL #3   Title pt will demo [facility] 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 He paritally met one STG, did not meet others. LTGs downgraded. Compensations were targeted today for improvement of these deficits. Pt would cont to benefit from skilled ST to improve his cognitive communication skills to return to as close to PLOF as possible.   Speech Therapy Frequency 2x / week   Treatment/Interventions Compensatory techniques;Cognitive reorganization;Environmental controls;Internal/external aids;SLP instruction and feedback;Functional tasks;Cueing hierarchy;Patient/family education   Potential to Achieve Goals Good   Potential Considerations Severity of impairments   SLP Home Exercise Plan reviewed and provided   Consulted and Agree with Plan of Care Patient      Patient will benefit from skilled therapeutic intervention in order to improve the following deficits and impairments:   Cognitive communication deficit    Problem List Patient Active Problem List    Diagnosis Date Noted  . Left-sided weakness   . Chronic pain of left knee 12/22/2016  . Stroke ([place]) 12/22/2016  . Bradycardia 11/12/2016  . Generalized weakness 11/12/2016  . AKI (acute kidney injury) ([place]) 11/12/2016  . Hypophosphatemia 11/12/2016  . Leukocytosis 11/12/2016  . Diabetes mellitus with complication ([place]) 16/04/9603  . At risk for falling 09/05/2015  . Chronic respiratory failure ([place]) 07/22/2015  . COLD (chronic obstructive lung disease) ([place]) 11/12/2014  . Dyspnea and respiratory  abnormality 11/12/2014  . Lung nodule 04/02/2014  . Smoking 04/02/2014  . Hypokalemia 12/15/2013  . Diabetes mellitus, type 2 ([place]) 09/04/2013  . Essential (primary) hypertension 09/04/2013  . Absolute anemia 09/01/2013  . Chronic pain 08/16/2013  . CAFL (chronic airflow limitation) ([place]) 08/16/2013  . Chronic obstructive pulmonary disease ([place]) 08/16/2013  . Adiposity 08/11/2013  . Peripheral blood vessel disorder ([place]) 07/03/2013  . Cannot sleep 07/03/2013  . Peripheral vascular disease ([place]) 07/03/2013  . Iron deficiency anemia 07/01/2013  . Hypotestosteronism 07/01/2013  . 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 Compulsive tobacco user syndrome 06/01/2013  . Current tobacco use 06/01/2013  . Pulmonary nodule, right 58m Aug 2014 03/25/2013  . Liver lesion 03/25/2013  . Abnormal magnetic resonance imaging study 03/21/2013  . Disease of liver 03/15/2013  . 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 Cancer screening 10/18/2012  . COPD (chronic obstructive pulmonary disease) ([place] 08/11/2012  . Adverse reaction to bacterial vaccine 06/17/2012  . 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 Body mass index 27.0-27.9, adult 01/04/2012  . Emphysema 11/23/2011  . Chronic gout 11/23/2011  . Congestive heart failure ([place] 11/23/2011  . Hypertension 11/23/2011  . Hyperlipidemia 11/23/2011  . Chronic bronchitis 11/23/2011  . Type 2 diabetes mellitus ([place] 11/23/2011  .  Chronic back pain 11/23/2011  . Osteoarthritis of left knee 11/23/2011  . Avitaminosis D 11/05/2011  . Gout 11/05/2011  . Cardiomyopathy, idiopathic ([place] 11/04/2011  . Nondependent alcohol abuse 11/04/2011  . Amnesia 11/04/2011  . Acid reflux 11/04/2011  . Arthropathia 11/04/2011  . Primary cardiomyopathy ([place] 11/04/2011    [facility],[place] 03/05/2017, 9:56 AM  [place]Union[address] [place] [address]Phone: [phone]  Fax:  [phone]  Name: James CHIOMRN: [id number] of Birth: [birth year]

## 2017-03-05 NOTE — Patient Instructions (Signed)
  Please complete the assigned speech therapy homework prior to your next session and return it to the speech therapist at your next visit.  

## 2017-03-08 ENCOUNTER — Encounter: Payer: Self-pay | Admitting: Neurology

## 2017-03-08 ENCOUNTER — Ambulatory Visit: Payer: PPO | Admitting: Speech Pathology

## 2017-03-08 ENCOUNTER — Ambulatory Visit: Payer: PPO | Admitting: Physical Therapy

## 2017-03-08 ENCOUNTER — Encounter: Payer: Self-pay | Admitting: Physical Therapy

## 2017-03-08 ENCOUNTER — Ambulatory Visit (INDEPENDENT_AMBULATORY_CARE_PROVIDER_SITE_OTHER): Payer: PPO | Admitting: Neurology

## 2017-03-08 VITALS — BP 144/96 | HR 64 | Ht 66.0 in | Wt 207.4 lb

## 2017-03-08 DIAGNOSIS — I639 Cerebral infarction, unspecified: Secondary | ICD-10-CM | POA: Diagnosis not present

## 2017-03-08 DIAGNOSIS — M6281 Muscle weakness (generalized): Secondary | ICD-10-CM | POA: Diagnosis not present

## 2017-03-08 DIAGNOSIS — R2689 Other abnormalities of gait and mobility: Secondary | ICD-10-CM

## 2017-03-08 DIAGNOSIS — I69954 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: Secondary | ICD-10-CM

## 2017-03-08 DIAGNOSIS — R41841 Cognitive communication deficit: Secondary | ICD-10-CM

## 2017-03-08 NOTE — Therapy (Signed)
[facility] [address] [place], [place], [address] Phone: [phone]   Fax:  [phone]  Physical Therapy Treatment and Discharge  Patient Details  Name: James Zuniga MRN: [id number] Date of Birth: Aug 31, 1955 Referring Provider: Zoe Lan NP  Encounter Date: 03/08/2017      PT End of Session - 03/08/17 2115    Visit Number 7   Number of Visits 9   Date for PT Re-Evaluation 04/04/17   Authorization Type [employer]   Authorization Time Period 02/03/17 TO 04/04/17   PT Start Time 0847   PT Stop Time 0930   PT Time Calculation (min) 43 min   Equipment Utilized During Treatment --   Activity Tolerance Patient tolerated treatment well   Behavior During Therapy [facility] for tasks assessed/performed      Past Medical History:  Diagnosis Date  . CHF (congestive heart failure) ([facility])   . Chronic airway obstruction ([facility])   . Chronic pain   . Coronary artery disease   . Diabetes mellitus   . Disorder of liver   . Esophageal reflux   . Gout   . Hyperlipidemia   . Hypertension   . Hypokalemia   . Hypokalemia 11/2016  . Hypotestosteronism 07/01/2013  . Insomnia   . Iron deficiency anemia, unspecified 07/01/2013  . Memory loss   . PVD (peripheral vascular disease) ([facility])   . Renal disorder   . Sleep apnea   . Stroke ([facility])   . Vitamin D deficiency     Past Surgical History:  Procedure Laterality Date  . KNEE ARTHROSCOPY    . NO PAST SURGERIES    . TEE WITHOUT CARDIOVERSION N/A 12/24/2016   Procedure: TRANSESOPHAGEAL ECHOCARDIOGRAM (TEE);  Surgeon: Pricilla Riffle, MD;  Location: [facility];  Service: Cardiovascular;  Laterality: N/A;    There were no vitals filed for this visit.      Subjective Assessment - 03/08/17 0851    Subjective Pt reports it takes him about 45 minutes to do his HEP. He feels like he is back to his baseline. "I had a bad knee and bad back before the stroke."   Pertinent  History HTN, CHF EF 25-45%, DM, back pain (uses TENS), COPD, tremor LUE more than RUE   Patient Stated Goals be able to strengthen lt knee so he can have knee surgery    Currently in Pain? Yes   Pain Score 7    Pain Location Back   Pain Orientation Left;Lower   Pain Descriptors / Indicators Pins and needles   Pain Type Chronic pain   Pain Onset More than a month ago   Pain Frequency Constant                         [facility] Adult PT Treatment/Exercise - 03/08/17 0917      Exercises   Exercises Ankle     Knee/Hip Exercises: Machines for Strengthening   Cybex Leg Press 60# x 10     Knee/Hip Exercises: Supine   Short Arc Quad Sets Strengthening;Both;1 set;5 reps   Bridges AROM;Strengthening;Both;1 set  5 reps     Ankle Exercises: Seated   Heel Raises 5 reps   Toe Raise 5 reps                PT Education - 03/08/17 2114    Education provided Yes   Education Details signs/symptoms of CVA and importance of getting to hospital within  the first 3 hours   Person(s) Educated Patient   Methods Explanation   Comprehension Verbalized understanding             PT Long Term Goals - 03/08/17 2121      PT LONG TERM GOAL #1   Title Patient will be independent with HEP for strengthening and balance.    Baseline 8/27 demonstrated independently   Time 4   Period Weeks   Status Achieved     PT LONG TERM GOAL #2   Title Patient  will be able to verbalize plan to continue exercise/activity post-discharge from PT.    Baseline 8/27 Plans to look into Silver Sneakers program through his insurance and continue HEP   Time 4   Period Weeks   Status Achieved     PT LONG TERM GOAL #3   Title Patient will agree to orthotist consult to discuss possilble orthotics to reduce knee buckling and reduce fall risk.    Time 4   Period Weeks   Status Deferred     PT LONG TERM GOAL #4   Title Patient will increase Berg score to >/=52/56 points compared to reduce falls  risk.   Baseline Revised on 03/05/17 as pt scored 49/56   Time 4   Period Weeks   Status Revised     PT LONG TERM GOAL #5   Title Patient can verbalize signs and symptoms of CVA and appropriate course of action.   Time 4   Period Weeks   Status Partially Met               Plan - 03/08/17 2117    Clinical Impression Statement Session focused on pt demonstrating independence with his HEP, reviewing signs/symptoms of stroke, and discussing renewal vs discharge. Patient reporting he is back to his baseline (with needing knee surgery and back pain prior to CVA). He reports financial difficulty with co-payment and feels he can continue HEP on his own. Discussed community resources and pt familiar with silver sneakers program as he used to do it. He states he will look into using that benefit. Patient is being discharged due to financial reasons having met 2 of 5 LTGs. Other goals were partially met, revised or deferred.    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 Left-sided weakness   . Chronic pain of left knee 12/22/2016  . Stroke ([place]) 12/22/2016  . Bradycardia 11/12/2016  . Generalized weakness 11/12/2016  . AKI (acute kidney injury) ([place]) 11/12/2016  . Hypophosphatemia 11/12/2016  . Leukocytosis 11/12/2016  . Diabetes mellitus with complication ([place]) 62/37/6283  . At risk  for falling 09/05/2015  . Chronic respiratory failure ([place]) 07/22/2015  . COLD (chronic obstructive lung disease) ([place]) 11/12/2014  . Dyspnea and respiratory abnormality 11/12/2014  . Lung nodule 04/02/2014  . Smoking 04/02/2014  . Hypokalemia 12/15/2013  . Diabetes  mellitus, type 2 ([place]) 09/04/2013  . Essential (primary) hypertension 09/04/2013  . Absolute anemia 09/01/2013  . Chronic pain 08/16/2013  . CAFL (chronic airflow limitation) ([place]) 08/16/2013  . Chronic obstructive pulmonary disease ([place]) 08/16/2013  . Adiposity 08/11/2013  . Peripheral blood vessel disorder ([place]) 07/03/2013  . Cannot sleep 07/03/2013  . Peripheral vascular disease ([place]) 07/03/2013  . Iron deficiency anemia 07/01/2013  . Hypotestosteronism 07/01/2013  . 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 Compulsive tobacco user syndrome 06/01/2013  . Current tobacco use 06/01/2013  . Pulmonary nodule, right 59m Aug 2014 03/25/2013  . Liver lesion 03/25/2013  . Abnormal magnetic resonance imaging study 03/21/2013  . Disease of liver 03/15/2013  . 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 Cancer screening 10/18/2012  . COPD (chronic obstructive pulmonary disease) ([place] 08/11/2012  . Adverse reaction to bacterial vaccine 06/17/2012  . 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 Body mass index 27.0-27.9, adult 01/04/2012  . Emphysema 11/23/2011  . Chronic gout 11/23/2011  . Congestive heart failure ([place] 11/23/2011  . Hypertension 11/23/2011  . Hyperlipidemia 11/23/2011  . Chronic bronchitis 11/23/2011  . Type 2 diabetes mellitus ([place] 11/23/2011  . Chronic back pain 11/23/2011  . Osteoarthritis of left knee 11/23/2011  . Avitaminosis D 11/05/2011  . Gout 11/05/2011  . Cardiomyopathy, idiopathic ([place] 11/04/2011  . Nondependent alcohol abuse 11/04/2011  . Amnesia 11/04/2011  . Acid reflux 11/04/2011  . Arthropathia 11/04/2011  . Primary cardiomyopathy ([place] 11/04/2011   PHYSICAL THERAPY  DISCHARGE SUMMARY  Visits from Start of Care: 7  Current functional level related to goals / functional outcomes: Met 2 of 5 goals; walking with cane   Remaining deficits: bil knee weakness and pain   Education / Equipment: HEP, Safe use of cane    Plan: Patient agrees to discharge.  Patient goals were partially met. Patient is being discharged due to financial reasons.  ?????       LRexanne Mano PT 03/08/2017, 9:30 PM  CM[place] CourtSHazel GreenGCottage City [place] [address]Phone: [phone]  Fax:  [phone] Name: James ROMNEYMRN: [id number] of Birth: 107/26/1957

## 2017-03-08 NOTE — Patient Instructions (Signed)
Knee Flexion: Hamstring Drop (Eccentric) - Seated (Weight Machine)    Sit on machine. Pull feet back until knees are at 90. Slowly release legs for 3-5 seconds. ___ reps per set, ___ sets per day, ___ days per week. Add ___ lbs when you achieve ___ repetitions.  http://ecce.exer.us/108   Copyright  VHI. All rights reserved.   Knee Extension: Quadriceps Minisquat (Eccentric), (Total GymT or ShuttleT)    Recline on machine. Extend both knees by pushing with feet. Do not lock knees. Slowly return to bent-knee position for 3-5 seconds. ___ reps per set, ___ sets per day, ___ days per week.  http://ecce.exer.us/110   Copyright  VHI. All rights reserved.

## 2017-03-08 NOTE — Telephone Encounter (Signed)
Pt scheduled with Noe Gens 03/17/17 at 945a Nothing further needed.

## 2017-03-08 NOTE — Therapy (Signed)
[facility] [address] [place], [place], [address] Phone: [phone]   Fax:  [phone]  Speech Language Pathology Treatment and Discharge Summary  Patient Details  Name: James Zuniga MRN: [id number] Date of Birth: Nov 06, 1955 Referring Provider: Eldridge Abrahams, NP  Encounter Date: 03/08/2017      End of Session - 03/08/17 1612    Visit Number 8   Number of Visits 17   Date for SLP Re-Evaluation 04/09/17   SLP Start Time 0805  James Zuniga on time; SLP had network access issues   SLP Stop Time  0847   SLP Time Calculation (min) 42 min   Activity Tolerance Patient tolerated treatment well      Past Medical History:  Diagnosis Date  . CHF (congestive heart failure) ([place])   . Chronic airway obstruction ([place])   . Chronic pain   . Coronary artery disease   . Diabetes mellitus   . Disorder of liver   . Esophageal reflux   . Gout   . Hyperlipidemia   . Hypertension   . Hypokalemia   . Hypokalemia 11/2016  . Hypotestosteronism 07/01/2013  . Insomnia   . Iron deficiency anemia, unspecified 07/01/2013  . Memory loss   . PVD (peripheral vascular disease) ([place])   . Renal disorder   . Sleep apnea   . Stroke ([place])   . Vitamin D deficiency     Past Surgical History:  Procedure Laterality Date  . KNEE ARTHROSCOPY    . NO PAST SURGERIES    . TEE WITHOUT CARDIOVERSION N/A 12/24/2016   Procedure: TRANSESOPHAGEAL ECHOCARDIOGRAM (TEE);  Surgeon: Fay Records, MD;  Location: [facility];  Service: Cardiovascular;  Laterality: N/A;    There were no vitals filed for this visit.      Subjective Assessment - 03/08/17 0807    Subjective "Some of these I don't need to keep in here" James Zuniga arrives with therapy folder full of unneccessary papers               ADULT SLP TREATMENT - 03/08/17 0805      General Information   Behavior/Cognition Alert;Cooperative;Pleasant mood   Patient Positioning Upright in chair   Oral  care provided N/A     Treatment Provided   Treatment provided Cognitive-Linquistic     Cognitive-Linquistic Treatment   Treatment focused on Cognition   Skilled Treatment Patient arrived with planner and correct folder, however homework not completed. James Zuniga demo'd awareness of unnecessary documents present in folder; with min A sorted papers into categories to save or discard. SLP educated re: system of organization, assisted James Zuniga with identifying a way to keep track of therapy materials. For simple functional time problems, James Zuniga required extended time and consistent mod-max A for attention to detail, checking for errors. James Zuniga called clinic this afternoon after appointments requesting discharge due to financial burden.     Assessment / Recommendations / Plan   Plan Discharge SLP treatment due to (comment)  James Zuniga requests d/c     Progression Toward Goals   Progression toward goals --  D/C, goals not met          SLP Education - 03/08/17 1614    Education provided Yes   Education Details Organizing information to improve functional recall/retrieval of information   Person(s) Educated Patient   Methods Explanation;Demonstration   Comprehension Verbalized understanding          SLP Short Term Goals - 03/08/17 1611  SLP SHORT TERM GOAL #1   Title James Zuniga will demo emergent awareness 80% of the time with simple cognitive linguistic tasks using modified indpendence (rechecks) x3 sessions   Status Not Met     SLP SHORT TERM GOAL #2   Title James Zuniga will double check (recheck) James Zuniga work with rare min A to do so x3 sessions   Status Not Met     SLP SHORT TERM GOAL #3   Title  James Zuniga will achieve 80% success with simple tasks involving time, money, calendar, etc. with rare min A over 3 sessions   Status Not Met     SLP SHORT TERM GOAL #4   Title James Zuniga will tell SLP 4 memory strategies with rare min A x3 sessions   Status Partially Met          SLP Long Term Goals - 03/08/17 1611      SLP LONG TERM  GOAL #1   Title James Zuniga will demo anticipatory awareness with min-mod complex cognitive linguistic tasks by using modified indpendence (rechecks) x3 sessions   Time 4   Period Weeks   Status Not Met     SLP LONG TERM GOAL #2   Title James Zuniga will use a memory strategy in 3 therapy sessions   Time 4   Period Weeks   Status Not Met     SLP LONG TERM GOAL #3   Title James Zuniga will demo [facility] organziational skills for min-mod complex cognitive-linguistic tasks with rare min A x 3 sessions   Time 4   Period Weeks   Status Not Met     SLP LONG TERM GOAL #4   Title James Zuniga will provide attention to detail adequate for 100% success in min-mod complex cognitive linguistic tasks with rare questioning cues x3 sessions   Time 4   Period Weeks   Status Not Met          Plan - 03/08/17 1613    Clinical Impression Statement James Zuniga continues to present with mod 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 Long term goals downgraded last session. Compensations were targeted today for improvement of these deficits. James Zuniga would cont to benefit from skilled ST to improve James Zuniga cognitive communication skills to return to as close to PLOF as possible, however James Zuniga requesting discharge at this time.    Speech Therapy Frequency --  d/c   Duration --  d/c   Potential to Achieve Goals Fair   Potential Considerations Severity of impairments;Financial resources   SLP Home Exercise Plan reviewed and provided   Consulted and Agree with Plan of Care Patient      Patient will benefit from skilled therapeutic intervention in order to improve the following deficits and impairments:   Cognitive communication deficit    Problem List Patient Active Problem List   Diagnosis Date Noted  . Left-sided weakness   . Chronic pain of left knee 12/22/2016  . Stroke ([place]) 12/22/2016  .  Bradycardia 11/12/2016  . Generalized weakness 11/12/2016  . AKI (acute kidney injury) ([place]) 11/12/2016  . Hypophosphatemia 11/12/2016  . Leukocytosis 11/12/2016  . Diabetes mellitus with complication ([place]) 53/64/6803  . At risk for falling 09/05/2015  . Chronic respiratory failure ([place]) 07/22/2015  . COLD (chronic obstructive lung disease) ([place]) 11/12/2014  . Dyspnea and respiratory abnormality 11/12/2014  . Lung nodule 04/02/2014  . Smoking 04/02/2014  . Hypokalemia  12/15/2013  . Diabetes mellitus, type 2 ([place]) 09/04/2013  . Essential (primary) hypertension 09/04/2013  . Absolute anemia 09/01/2013  . Chronic pain 08/16/2013  . CAFL (chronic airflow limitation) ([place]) 08/16/2013  . Chronic obstructive pulmonary disease ([place]) 08/16/2013  . Adiposity 08/11/2013  . Peripheral blood vessel disorder ([place]) 07/03/2013  . Cannot sleep 07/03/2013  . Peripheral vascular disease ([place]) 07/03/2013  . Iron deficiency anemia 07/01/2013  . Hypotestosteronism 07/01/2013  . 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 Compulsive tobacco user syndrome 06/01/2013  . Current tobacco use 06/01/2013  . Pulmonary nodule, right 16m Aug 2014 03/25/2013  . Liver lesion 03/25/2013  . Abnormal magnetic resonance imaging study 03/21/2013  . Disease of liver 03/15/2013  . 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 Cancer screening 10/18/2012  . COPD (chronic obstructive pulmonary disease) ([place] 08/11/2012  . Adverse reaction to bacterial vaccine 06/17/2012  . 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 Body mass index 27.0-27.9, adult 01/04/2012  . Emphysema 11/23/2011  . Chronic gout 11/23/2011  . Congestive heart failure ([place] 11/23/2011  . Hypertension 11/23/2011  . Hyperlipidemia 11/23/2011  . Chronic bronchitis 11/23/2011  . Type 2 diabetes mellitus ([place] 11/23/2011  . Chronic back pain 11/23/2011  . Osteoarthritis of left knee 11/23/2011  . Avitaminosis D 11/05/2011  . Gout 11/05/2011  .  Cardiomyopathy, idiopathic ([place] 11/04/2011  . Nondependent alcohol abuse 11/04/2011  . Amnesia 11/04/2011  . Acid reflux 11/04/2011  . Arthropathia 11/04/2011  . Primary cardiomyopathy ([place] 11/04/2011   SPEECH THERAPY DISCHARGE SUMMARY  Visits from Start of Care: 8  Current functional level related to goals / functional outcomes: James Zuniga partially met 1/4 short term goals, 0/4 long term goals   Remaining deficits: Moderate cognitive communication deficits including but not limited to decr'd awareness, memory skills, problem solving and organization, awareness, and attention/attention to detail.    Education / Equipment: None recommended Plan: Patient agrees to discharge.  Patient goals were not met. Patient is being discharged due to financial reasons.  ?????     MDeneise Lever MV[place] CCC-SLP Speech-Language Pathologist  MAliene Altes8/27/2018, 4:51 PM  [address] [address]Phone: [phone]  Fax:  [phone]  Name: LDIAMOND MARTUCCIMRN: [id number] of Birth: 1Dec 03, 1957

## 2017-03-08 NOTE — Patient Instructions (Signed)
I had a long d/w patient and his wife about his recent cryptogenic stroke, risk for recurrent stroke/TIAs, personally independently reviewed imaging studies and stroke evaluation results and answered questions.Continue aspirin 81 mg daily and clopidogrel 75 mg daily  for secondary stroke prevention given h/o significant cardiac disease and maintain strict control of hypertension with blood pressure goal below 130/90, diabetes with hemoglobin A1c goal below 6.5% and lipids with LDL cholesterol goal below 70 mg/dL. I also advised the patient to eat a healthy diet with plenty of whole grains, cereals, fruits and vegetables, exercise regularly and maintain ideal body weight .he was advised to follow-up with his cardiologist in [facility] to get the results of the 30 day heart monitor and make a decision about whether he needs defibrillator on not. The patient was given neurological clearance to drive. Followup in the future with my nurse practitioner in 6 months or call earlier if necessary  Stroke Prevention Some medical conditions and behaviors are associated with an increased chance of having a stroke. You may prevent a stroke by making healthy choices and managing medical conditions. How can I reduce my risk of having a stroke?  Stay physically active. Get at least 30 minutes of activity on most or all days.  Do not smoke. It may also be helpful to avoid exposure to secondhand smoke.  Limit alcohol use. Moderate alcohol use is considered to be: ? No more than 2 drinks per day for men. ? No more than 1 drink per day for nonpregnant women.  Eat healthy foods. This involves: ? Eating 5 or more servings of fruits and vegetables a day. ? Making dietary changes that address high blood pressure (hypertension), high cholesterol, diabetes, or obesity.  Manage your cholesterol levels. ? Making food choices that are high in fiber and low in saturated fat, trans fat, and cholesterol may control cholesterol  levels. ? Take any prescribed medicines to control cholesterol as directed by your health care provider.  Manage your diabetes. ? Controlling your carbohydrate and sugar intake is recommended to manage diabetes. ? Take any prescribed medicines to control diabetes as directed by your health care provider.  Control your hypertension. ? Making food choices that are low in salt (sodium), saturated fat, trans fat, and cholesterol is recommended to manage hypertension. ? Ask your health care provider if you need treatment to lower your blood pressure. Take any prescribed medicines to control hypertension as directed by your health care provider. ? If you are 81-13 years of age, have your blood pressure checked every 3-5 years. If you are 5 years of age or older, have your blood pressure checked every year.  Maintain a healthy weight. ? Reducing calorie intake and making food choices that are low in sodium, saturated fat, trans fat, and cholesterol are recommended to manage weight.  Stop drug abuse.  Avoid taking birth control pills. ? Talk to your health care provider about the risks of taking birth control pills if you are over 40 years old, smoke, get migraines, or have ever had a blood clot.  Get evaluated for sleep disorders (sleep apnea). ? Talk to your health care provider about getting a sleep evaluation if you snore a lot or have excessive sleepiness.  Take medicines only as directed by your health care provider. ? For some people, aspirin or blood thinners (anticoagulants) are helpful in reducing the risk of forming abnormal blood clots that can lead to stroke. If you have the irregular heart rhythm of  atrial fibrillation, you should be on a blood thinner unless there is a good reason you cannot take them. ? Understand all your medicine instructions.  Make sure that other conditions (such as anemia or atherosclerosis) are addressed. Get help right away if:  You have sudden weakness  or numbness of the face, arm, or leg, especially on one side of the body.  Your face or eyelid droops to one side.  You have sudden confusion.  You have trouble speaking (aphasia) or understanding.  You have sudden trouble seeing in one or both eyes.  You have sudden trouble walking.  You have dizziness.  You have a loss of balance or coordination.  You have a sudden, severe headache with no known cause.  You have new chest pain or an irregular heartbeat. Any of these symptoms may represent a serious problem that is an emergency. Do not wait to see if the symptoms will go away. Get medical help at once. Call your local emergency services (911 in [place] Do not drive yourself to the hospital. This information is not intended to replace advice given to you by your health care provider. Make sure you discuss any questions you have with your health care provider. Document Released: 08/06/2004 Document Revised: 12/05/2015 Document Reviewed: 12/30/2012 Elsevier Interactive Patient Education  2017 [employer].

## 2017-03-09 ENCOUNTER — Ambulatory Visit: Payer: PPO | Admitting: Physical Therapy

## 2017-03-09 ENCOUNTER — Encounter: Payer: Self-pay | Admitting: Occupational Therapy

## 2017-03-09 NOTE — Progress Notes (Signed)
[facility] [address] [place]. [place] [address] [phone]       OFFICE FOLLOW-UP NOTE  [name] Date of Birth:  [birth year] Medical Record Number:  [id number]   HPI: Mr James Zuniga is a 61 year old Caucasian male seen today for first office follow-up visit following hospital admission for stroke in July 2018. History is obtained from the patient, sister, review of electronic medical records and imaging films which have personally reviewed.James Zuniga an 61 y.o.male with a history of CHF, CAD, COPD, diabetes mellitus, hypertension, and peripheral vascular disease who presented 12/22/2016 for acute onset LUE incoordination and weakness.   He woke up at about 0130 and noticed that he was having difficulty using his left hand to adjust his CPAP mask. He then went back to bed. When his wife arrived home later this morning (she is a CNA), she noted that he was not quite right and took him to his PCP, who also noted a deficit and had him transported emergently to the ED. Code Stroke was called by EMS en route when patient began having difficulty seeing and with his speech.  LKN was 0005 on 12/22/2016. Patient was not administered IV t-PA due to arriving outside of the treatment window.  He was admitted to General Neurology for further evaluation and treatment. CT scan of the head on admission was unremarkable and CT angiogram was negative for emergent large vessel occlusion. CT perfusion study was degraded by motion artifact. MRI scan of the brain which I have personally reviewed showed an acute small right frontoparietal MCA branch infarct with moderate changes of chronic small vessel disease. Transthoracic echo showed normal ejection fraction. Transesophageal echocardiogram showed no cardiac source of embolism. There were a few late bubbles in the left atrium consistent with a small intrapulmonary shunt which was felt to be clinically not significant. Left ventricular ejection  fraction was found to be severely depressed and it was felt patient needed outpatient cardiology consultation and follow-up hence loop recorder was not placed. The patient states she has seen cardiologist in [facility] and has done a 30 day external heart monitor but the results of which are yet pending. It is unclear whether the patient needs a defibrillator or not at this point. LDL cholesterol was 37 mg percent. Hemoglobin A1c was 6.6. The patient has obtainedn and significant improvement in left-sided strength though fine motor skills are still diminished on the left. She also has chronic left knee pain which limits her ambulation and she has to use a cane for long distances.  ROS:   14 system review of systems is positive for  excessive thirst, memory loss and all other systems negative PMH:  Past Medical History:  Diagnosis Date  . CHF (congestive heart failure) ([place])   . Chronic airway obstruction ([place])   . Chronic pain   . Coronary artery disease   . Diabetes mellitus   . Disorder of liver   . Esophageal reflux   . Gout   . Hyperlipidemia   . Hypertension   . Hypokalemia   . Hypokalemia 11/2016  . Hypotestosteronism 07/01/2013  . Insomnia   . Iron deficiency anemia, unspecified 07/01/2013  . Memory loss   . PVD (peripheral vascular disease) ([place])   . Renal disorder   . Sleep apnea   . Stroke ([place])   . Vitamin D deficiency     Social History:  Social History   Social History  . Marital status: Married  Spouse name: N/A  . Number of children: N/A  . Years of education: N/A   Occupational History  . Not on file.   Social History Main Topics  . Smoking status: Current Every Day Smoker    Packs/day: 0.50    Years: 30.00    Types: Cigarettes    Start date: 08/05/1983  . Smokeless tobacco: Never Used     Comment: 1/2 pack  . Alcohol use No  . Drug use: No  . Sexual activity: Not on file   Other Topics Concern  . Not on file   Social History Narrative  . No  narrative on file    Medications:   Current Outpatient Prescriptions on File Prior to Visit  Medication Sig Dispense Refill  . albuterol (PROVENTIL HFA;VENTOLIN HFA) 108 (90 Base) MCG/ACT inhaler Inhale 1-2 puffs into the lungs every 6 (six) hours as needed for wheezing. 1 Inhaler 0  . albuterol (PROVENTIL) (2.5 MG/3ML) 0.083% nebulizer solution Take 2.5 mg by nebulization every 6 (six) hours as needed for wheezing or shortness of breath.    . allopurinol (ZYLOPRIM) 300 MG tablet Take 300 mg by mouth daily.    Marland Kitchen aspirin 81 MG EC tablet Take 81 mg by mouth daily. Swallow whole.    Marland Kitchen atorvastatin (LIPITOR) 80 MG tablet Take 1 tablet (80 mg total) by mouth every morning. 30 tablet 0  . BREO ELLIPTA 100-25 MCG/INH AEPB INL 1 PUFF PO D (Patient taking differently: Inhale 1 puff into the lungs daily. ) 1 each 0  . clopidogrel (PLAVIX) 75 MG tablet Take 1 tablet (75 mg total) by mouth daily. 30 tablet 2  . Dulaglutide 0.75 MG/0.5ML SOPN Inject 0.75 mg into the skin every 7 (seven) days. Tuesday of each week    . DULoxetine (CYMBALTA) 60 MG capsule Take 60 mg by mouth daily.    . Fluticasone-Umeclidin-Vilant 100-62.5-25 MCG/INH AEPB Inhale 1 puff into the lungs daily.    . furosemide (LASIX) 20 MG tablet Take 20 mg by mouth 2 (two) times daily.     Marland Kitchen gabapentin (NEURONTIN) 300 MG capsule Take 300 mg by mouth 3 (three) times daily.     Marland Kitchen losartan (COZAAR) 100 MG tablet Take 100 mg by mouth daily.     . metoprolol succinate (TOPROL-XL) 25 MG 24 hr tablet Take 1 tablet (25 mg total) by mouth daily. 60 tablet 2  . omeprazole (PRILOSEC) 20 MG capsule Take 20 mg by mouth daily.   0  . OXYGEN Inhale 2 L into the lungs as needed.    . potassium chloride SA (K-DUR,KLOR-CON) 20 MEQ tablet Take 1 tablet (20 mEq total) by mouth 2 (two) times daily. 30 tablet 3  . primidone (MYSOLINE) 50 MG tablet Take 50 mg by mouth daily.     . tamsulosin (FLOMAX) 0.4 MG CAPS capsule Take 1 capsule (0.4 mg total) by mouth daily  after breakfast. 30 capsule 12  . tiZANidine (ZANAFLEX) 4 MG tablet 4 mg 3 (three) times daily as needed for muscle spasms.   0  . traMADol (ULTRAM) 50 MG tablet Take 50 mg by mouth daily as needed for moderate pain.     Marland Kitchen umeclidinium bromide (INCRUSE ELLIPTA) 62.5 MCG/INH AEPB Inhale 1 puff into the lungs daily. 1 each 0  . zolpidem (AMBIEN) 10 MG tablet Take 10 mg by mouth at bedtime.      No current facility-administered medications on file prior to visit.     Allergies:  No Known  Allergies  Physical Exam General: well developed, well nourished middle aged male, seated, in no evident distress Head: head normocephalic and atraumatic.  Neck: supple with no carotid or supraclavicular bruits Cardiovascular: regular rate and rhythm, no murmurs Musculoskeletal: no deformity Skin:  no rash/petichiae Vascular:  Normal pulses all extremities Vitals:   03/08/17 1020  BP: (!) 144/96  Pulse: 64   Neurologic Exam Mental Status: Awake and fully alert. Oriented to place and time. Recent and remote memory intact. Attention span, concentration and fund of knowledge appropriate. Mood and affect appropriate.  Cranial Nerves: Fundoscopic exam reveals sharp disc margins. Pupils equal, briskly reactive to light. Extraocular movements full without nystagmus. Visual fields full to confrontation. Hearing intact. Facial sensation intact. Face, tongue, palate moves normally and symmetrically.  Motor: Normal bulk and tone. Normal strength in all tested extremity muscles.Diminished fine finger movements on the left. Orbits right over left upper extremity. Mild weakness of left grip only. Sensory.: intact to touch ,pinprick .position and vibratory sensation.  Coordination: Rapid alternating movements normal in all extremities. Finger-to-nose and heel-to-shin performed accurately bilaterally. Gait and Station: Arises from chair without difficulty. Stance is normal. Gait demonstrates normal stride length and  balance but favors left knee due to pain and uses a cane. . Able to heel, toe and tandem walk with some t difficulty.  Reflexes: 1+ and symmetric. Toes downgoing.   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   PLAN: I had a long d/w patient and his wife about his recent cryptogenic stroke, risk for recurrent stroke/TIAs, personally independently reviewed imaging studies and stroke evaluation results and answered questions.Continue aspirin 81 mg daily and clopidogrel 75 mg daily  for secondary stroke prevention given h/o significant cardiac disease and maintain strict control of hypertension with blood pressure goal below 130/90, diabetes with hemoglobin A1c goal below 6.5% and lipids with LDL cholesterol goal below 70 mg/dL. I also advised the patient to eat a healthy diet with plenty of whole grains, cereals, fruits and vegetables, exercise regularly and maintain ideal body weight .he was advised to follow-up with his cardiologist in [facility] to get the results of the 30 day heart monitor and make a decision about whether he needs defibrillator on not. The patient was given neurological clearance to drive. Followup in the future with my nurse practitioner in 6 months or call earlier if necessary Greater than 50% of time during this 25 minute visit was spent on counseling,explanation of diagnosis, planning of further management, discussion with patient and family and coordination of care Antony Contras, MD  [facility] [address] [place], [place] [address]  Phone [phone] Fax [phone] Note: This document was prepared with digital dictation and possible smart phrase technology. Any transcriptional errors that result from this process are unintentional

## 2017-03-11 ENCOUNTER — Encounter: Payer: Self-pay | Admitting: Occupational Therapy

## 2017-03-11 ENCOUNTER — Ambulatory Visit: Payer: Self-pay | Admitting: Physical Therapy

## 2017-03-11 ENCOUNTER — Encounter: Payer: Self-pay | Admitting: Speech Pathology

## 2017-03-12 ENCOUNTER — Other Ambulatory Visit: Payer: Self-pay

## 2017-03-12 ENCOUNTER — Ambulatory Visit: Payer: Self-pay

## 2017-03-12 ENCOUNTER — Ambulatory Visit: Payer: Self-pay | Admitting: Hematology & Oncology

## 2017-03-16 ENCOUNTER — Encounter: Payer: Self-pay | Admitting: Occupational Therapy

## 2017-03-16 ENCOUNTER — Ambulatory Visit: Payer: Self-pay | Admitting: Physical Therapy

## 2017-03-16 NOTE — Progress Notes (Signed)
[facility]    Chief Complaint  Patient presents with  . Follow-up    qualifying walk, on nicotine patch, stopped smoking 5 days ago, needs O2 added to his CPAP machine     Current Outpatient Prescriptions on File Prior to Visit  Medication Sig  . albuterol (PROVENTIL HFA;VENTOLIN HFA) 108 (90 Base) MCG/ACT inhaler Inhale 1-2 puffs into the lungs every 6 (six) hours as needed for wheezing.  Marland Kitchen albuterol (PROVENTIL) (2.5 MG/3ML) 0.083% nebulizer solution Take 2.5 mg by nebulization every 6 (six) hours as needed for wheezing or shortness of breath.  . allopurinol (ZYLOPRIM) 300 MG tablet Take 300 mg by mouth daily.  Marland Kitchen aspirin 81 MG EC tablet Take 81 mg by mouth daily. Swallow whole.  Marland Kitchen atorvastatin (LIPITOR) 80 MG tablet Take 1 tablet (80 mg total) by mouth every morning.  Marland Kitchen BREO ELLIPTA 100-25 MCG/INH AEPB INL 1 PUFF PO D (Patient taking differently: Inhale 1 puff into the lungs daily. )  . clopidogrel (PLAVIX) 75 MG tablet Take 1 tablet (75 mg total) by mouth daily.  . Dulaglutide 0.75 MG/0.5ML SOPN Inject 0.75 mg into the skin every 7 (seven) days. Tuesday of each week  . DULoxetine (CYMBALTA) 60 MG capsule Take 60 mg by mouth daily.  . Fluticasone-Umeclidin-Vilant 100-62.5-25 MCG/INH AEPB Inhale 1 puff into the lungs daily.  . furosemide (LASIX) 20 MG tablet Take 20 mg by mouth 2 (two) times daily.   Marland Kitchen gabapentin (NEURONTIN) 300 MG capsule Take 300 mg by mouth 3 (three) times daily.   Marland Kitchen losartan (COZAAR) 100 MG tablet Take 100 mg by mouth daily.   . metoprolol succinate (TOPROL-XL) 25 MG 24 hr tablet Take 1 tablet (25 mg total) by mouth daily.  Marland Kitchen omeprazole (PRILOSEC) 20 MG capsule Take 20 mg by mouth daily.   . OXYGEN Inhale 2 L into the lungs as needed.  . potassium chloride SA (K-DUR,KLOR-CON) 20 MEQ tablet Take 1 tablet (20 mEq total) by mouth 2 (two) times daily.  . primidone (MYSOLINE) 50 MG tablet Take 50 mg by mouth daily.   . tamsulosin (FLOMAX) 0.4 MG CAPS capsule Take  1 capsule (0.4 mg total) by mouth daily after breakfast.  . tiZANidine (ZANAFLEX) 4 MG tablet 4 mg 3 (three) times daily as needed for muscle spasms.   . traMADol (ULTRAM) 50 MG tablet Take 50 mg by mouth daily as needed for moderate pain.   Marland Kitchen umeclidinium bromide (INCRUSE ELLIPTA) 62.5 MCG/INH AEPB Inhale 1 puff into the lungs daily.  Marland Kitchen zolpidem (AMBIEN) 10 MG tablet Take 10 mg by mouth at bedtime.    No current facility-administered medications on file prior to visit.      Past Medical Hx:  has a past medical history of CHF (congestive heart failure) ([place]); Chronic airway obstruction ([place]); Chronic pain; Coronary artery disease; Diabetes mellitus; Disorder of liver; Esophageal reflux; Gout; Hyperlipidemia; Hypertension; Hypokalemia; Hypokalemia (11/2016); Hypotestosteronism (07/01/2013); Insomnia; Iron deficiency anemia, unspecified (07/01/2013); Memory loss; PVD (peripheral vascular disease) ([place]); Renal disorder; Sleep apnea; Stroke ([place]); and Vitamin D deficiency.   Past Surgical hx, Allergies, Family hx, Social hx all reviewed.  Vital Signs BP 140/90 (BP Location: Left Arm, Cuff Size: Normal)   Pulse 66   Ht 5\' 6"  (1.676 m)   Wt 208 lb 6.4 oz (94.5 kg)   SpO2 99%   BMI 33.64 kg/m   History of Present Illness James Zuniga is a 61 y.o. 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    The home care agency requested re-qualification of O2 needs.  The patient was last seen in 08/2016 by Dr. Chase Caller for routine visit.  He was supposed to follow up in 6 months to discuss lung cancer screening but was unfortunately hospitalized for CVA in the interim.  At baseline he wears CPAP daily at bedtime. He is also supposed to be on nocturnal oxygen. Unfortunately, his oxygen has not been bled  into his CPAP and he has been alternating nights of oxygen/CPAP. The patient denies acute symptoms. He reports he quit smoking 5 days ago (03/12/17).  He reports he is currently in the donut hole and has difficulties obtaining his medications. At his last visit he was given samples of medications.   Physical Exam  General - well developed adult M in no acute distress ENT - No sinus tenderness, no oral exudate, no LAN Cardiac - s1s2 regular, no murmur Chest - even/non-labored, lungs bilaterally clear. No wheeze/rales Back - No focal tenderness Abd - Soft, non-tender Ext - No edema Neuro - Normal strength Skin - No rashes Psych - normal mood, and behavior  Studies: 02/27/14  CT Chest >> Stable 4 mm sub solid right upper lobe pulmonary nodule.  No acute chest findings. Stable size of multiple low density hepatic lesions. 07/26/15  CT Chest >> 4 mm right upper lobe pulmonary nodule is unchanged, and can be considered benign.  No new or enlarging pulmonary nodules. No acute process in the chest.  Suboptimal evaluation of liver lesions. These have been evaluated on remote prior PET and MR.   Assessment/Plan: Primary Pulmonary - Dr. Chase Caller  Discussion:  61 y/o M with obstructive lung disease, OSA and known pulmonary nodule who presented for re-qualification of nocturnal O2 needs.  He was recently hospitalized for CVA in July 2018.  In the past, he has had difficulty affording his pulmonary medications. Last ONO in 2017.  Patient uses [employer] as DME company.  1. Nocturnal Hypoxemia  - 2.5L baseline  2. COPD  3. OSA on CPAP 4. Pulmonary Nodule - stable since 07/26/2015.   5. Tobacco abuse   Plan: DME company contacted to arrange oxygen bleed in for CPAP, 2.5 L QHS Patient given samples of Incruse.  2 to difficulty affording medications we discuss transition to DuoNeb. Reviewed this with Dr. Chase Caller and office. Patient instructed to continue increase until gone and then begin DuoNeb every 6  hours while awake. Pt will need follow up discussion regarding lung cancer screening Follow-up with Dr. Chase Caller in 3 months to review need for low-dose CT screening & and review of DuoNeb tolerance Smoking cessation counseling provided in office-reviewed risks of progressive lung disease, stroke, MI and peripheral vascular disease. Patient is tolerating the nicotine patch.   Patient Instructions  1.  We will give E samples of Incruse today in office. Inhale 1 puff daily until gone (this will allow [place] time to be set up with DuoNeb's). 2.  Once you have used the Incruse, he can begin DuoNeb nebulization every 6 hours while awake.  This medication will be provided by your DME company. Do not fill this prescription at a pharmacy. 3.  Continue your CPAP every night. We are arranging for you to have your oxygen bled into her CPAP machine. 4.  Please call if you have  new or worsening symptoms. 5.  Return to see Dr. Chase Caller for follow-up of pulmonary nodule & review of how you're tolerating DuoNeb in 3 months. 6.  Congratulations on quitting smoking!!  Keep up the good work. This is the best thing that you can do for your health-it will reduce risk of stroke and heart attack.   Noe Gens, NP-C [facility]  [phone] 03/17/2017, 10:56 AM

## 2017-03-17 ENCOUNTER — Encounter: Payer: Self-pay | Admitting: Pulmonary Disease

## 2017-03-17 ENCOUNTER — Ambulatory Visit (INDEPENDENT_AMBULATORY_CARE_PROVIDER_SITE_OTHER): Payer: PPO | Admitting: Pulmonary Disease

## 2017-03-17 VITALS — BP 140/90 | HR 66 | Ht 66.0 in | Wt 208.4 lb

## 2017-03-17 DIAGNOSIS — J439 Emphysema, unspecified: Secondary | ICD-10-CM | POA: Diagnosis not present

## 2017-03-17 MED ORDER — IPRATROPIUM-ALBUTEROL 0.5-2.5 (3) MG/3ML IN SOLN: 3.0000 mL | Freq: Four times a day (QID) | RESPIRATORY_TRACT | 5 refills | Status: DC | PRN

## 2017-03-17 NOTE — Addendum Note (Signed)
Addended by: Jannette Spanner on: 03/17/2017 11:45 AM   Modules accepted: Orders

## 2017-03-17 NOTE — Patient Instructions (Addendum)
1.  We will give E samples of Incruse today in office. Inhale 1 puff daily until gone (this will allow [place] time to be set up with DuoNeb's). 2.  Once you have used the Incruse, he can begin DuoNeb nebulization every 6 hours while awake.  This medication will be provided by your DME company. Do not fill this prescription at a pharmacy. 3.  Continue your CPAP every night. We are arranging for you to have your oxygen bled into her CPAP machine. 4.  Please call if you have new or worsening symptoms. 5.  Return to see Dr. Chase Caller for follow-up of pulmonary nodule & review of how you're tolerating DuoNeb in 3 months. 6.  Congratulations on quitting smoking!!  Keep up the good work. This is the best thing that you can do for your health-it will reduce risk of stroke and heart attack.

## 2017-03-18 ENCOUNTER — Encounter: Payer: Self-pay | Admitting: Occupational Therapy

## 2017-03-18 ENCOUNTER — Ambulatory Visit: Payer: Self-pay | Admitting: Physical Therapy

## 2017-03-19 ENCOUNTER — Ambulatory Visit: Payer: Self-pay | Admitting: Hematology & Oncology

## 2017-03-19 ENCOUNTER — Other Ambulatory Visit: Payer: Self-pay | Admitting: Internal Medicine

## 2017-03-19 ENCOUNTER — Other Ambulatory Visit: Payer: Self-pay

## 2017-03-19 ENCOUNTER — Ambulatory Visit: Payer: Self-pay

## 2017-03-22 ENCOUNTER — Ambulatory Visit: Payer: Self-pay | Admitting: Physical Therapy

## 2017-03-22 ENCOUNTER — Encounter: Payer: Self-pay | Admitting: Speech Pathology

## 2017-03-22 ENCOUNTER — Encounter: Payer: Self-pay | Admitting: Occupational Therapy

## 2017-03-25 ENCOUNTER — Encounter: Payer: Self-pay | Admitting: Speech Pathology

## 2017-03-25 ENCOUNTER — Telehealth: Payer: Self-pay | Admitting: Pulmonary Disease

## 2017-03-25 ENCOUNTER — Ambulatory Visit: Payer: Self-pay | Admitting: Physical Therapy

## 2017-03-25 ENCOUNTER — Encounter: Payer: Self-pay | Admitting: Occupational Therapy

## 2017-03-25 DIAGNOSIS — J449 Chronic obstructive pulmonary disease, unspecified: Secondary | ICD-10-CM | POA: Diagnosis not present

## 2017-03-25 DIAGNOSIS — J439 Emphysema, unspecified: Secondary | ICD-10-CM

## 2017-03-25 MED ORDER — IPRATROPIUM-ALBUTEROL 0.5-2.5 (3) MG/3ML IN SOLN: 3.0000 mL | Freq: Four times a day (QID) | RESPIRATORY_TRACT | 5 refills | Status: DC | PRN

## 2017-03-25 NOTE — Telephone Encounter (Signed)
Spoke with pt, James Zuniga states he is transitioning from another DME company and his ONO is too old and would like to get another one ordered to see if hew still qualifies.. Please order the ONO on CPAP and room air. The last ONO wa sonly on room air so we have to specify these instructions for him to qualify. Please advise if this ok to do MR.    Staff message to [place] when completed. Thanks.

## 2017-03-25 NOTE — Telephone Encounter (Signed)
I called James Zuniga at [facility] and left a message to call back. Sent Rx to the pharmacy already.

## 2017-03-26 ENCOUNTER — Ambulatory Visit: Payer: Self-pay

## 2017-03-26 DIAGNOSIS — I071 Rheumatic tricuspid insufficiency: Secondary | ICD-10-CM | POA: Diagnosis not present

## 2017-03-26 DIAGNOSIS — I340 Nonrheumatic mitral (valve) insufficiency: Secondary | ICD-10-CM | POA: Diagnosis not present

## 2017-03-29 DIAGNOSIS — J449 Chronic obstructive pulmonary disease, unspecified: Secondary | ICD-10-CM | POA: Diagnosis not present

## 2017-03-29 NOTE — Telephone Encounter (Signed)
Called and spoke with the pharmacy and they stated that medicare part b will not cover the prn or as needed on any rx for pts.  The neb rx that was sent over had these in it and they requested that this be dropped from the rx.  VO was given to drop it and they will fax over a paper for brandi to sign for the correction.  Nothing further is needed.

## 2017-03-30 NOTE — Telephone Encounter (Signed)
Ok to order ONO on CPAP on Room air  Dr. Brand Males, M.D., [facility].C.P Pulmonary and Critical Care Medicine Staff Physician [facility] Pager: [phone], If no answer or between  15:00h - 7:00h: call [phone]  03/30/2017 8:53 AM

## 2017-03-30 NOTE — Telephone Encounter (Signed)
Ok ONO ordered on CPAP. Nothing further is needed.

## 2017-04-01 ENCOUNTER — Encounter: Payer: Self-pay | Admitting: Speech Pathology

## 2017-04-05 ENCOUNTER — Encounter: Payer: Self-pay | Admitting: Speech Pathology

## 2017-04-06 DIAGNOSIS — I10 Essential (primary) hypertension: Secondary | ICD-10-CM | POA: Diagnosis not present

## 2017-04-06 DIAGNOSIS — Z9989 Dependence on other enabling machines and devices: Secondary | ICD-10-CM

## 2017-04-06 DIAGNOSIS — E1100 Type 2 diabetes mellitus with hyperosmolarity without nonketotic hyperglycemic-hyperosmolar coma (NKHHC): Secondary | ICD-10-CM | POA: Diagnosis not present

## 2017-04-06 DIAGNOSIS — I428 Other cardiomyopathies: Secondary | ICD-10-CM | POA: Diagnosis not present

## 2017-04-06 DIAGNOSIS — G4733 Obstructive sleep apnea (adult) (pediatric): Secondary | ICD-10-CM | POA: Insufficient documentation

## 2017-04-06 DIAGNOSIS — J431 Panlobular emphysema: Secondary | ICD-10-CM | POA: Diagnosis not present

## 2017-04-09 ENCOUNTER — Ambulatory Visit: Payer: Self-pay

## 2017-04-12 ENCOUNTER — Encounter: Payer: Self-pay | Admitting: Speech Pathology

## 2017-04-12 DIAGNOSIS — I472 Ventricular tachycardia: Secondary | ICD-10-CM | POA: Diagnosis not present

## 2017-04-12 DIAGNOSIS — I429 Cardiomyopathy, unspecified: Secondary | ICD-10-CM | POA: Diagnosis not present

## 2017-04-12 DIAGNOSIS — R5383 Other fatigue: Secondary | ICD-10-CM | POA: Diagnosis not present

## 2017-04-12 DIAGNOSIS — Z79899 Other long term (current) drug therapy: Secondary | ICD-10-CM | POA: Diagnosis not present

## 2017-04-12 DIAGNOSIS — R600 Localized edema: Secondary | ICD-10-CM | POA: Diagnosis not present

## 2017-04-12 DIAGNOSIS — E119 Type 2 diabetes mellitus without complications: Secondary | ICD-10-CM | POA: Diagnosis not present

## 2017-04-12 DIAGNOSIS — Z7902 Long term (current) use of antithrombotics/antiplatelets: Secondary | ICD-10-CM | POA: Diagnosis not present

## 2017-04-12 DIAGNOSIS — Z7982 Long term (current) use of aspirin: Secondary | ICD-10-CM | POA: Diagnosis not present

## 2017-04-12 DIAGNOSIS — G4733 Obstructive sleep apnea (adult) (pediatric): Secondary | ICD-10-CM | POA: Diagnosis not present

## 2017-04-12 DIAGNOSIS — I428 Other cardiomyopathies: Secondary | ICD-10-CM | POA: Diagnosis not present

## 2017-04-12 DIAGNOSIS — I493 Ventricular premature depolarization: Secondary | ICD-10-CM | POA: Diagnosis not present

## 2017-04-12 DIAGNOSIS — Z8673 Personal history of transient ischemic attack (TIA), and cerebral infarction without residual deficits: Secondary | ICD-10-CM | POA: Diagnosis not present

## 2017-04-12 DIAGNOSIS — Z87891 Personal history of nicotine dependence: Secondary | ICD-10-CM | POA: Diagnosis not present

## 2017-04-12 DIAGNOSIS — R531 Weakness: Secondary | ICD-10-CM | POA: Diagnosis not present

## 2017-04-12 DIAGNOSIS — I10 Essential (primary) hypertension: Secondary | ICD-10-CM | POA: Diagnosis not present

## 2017-04-12 DIAGNOSIS — J449 Chronic obstructive pulmonary disease, unspecified: Secondary | ICD-10-CM | POA: Diagnosis not present

## 2017-04-22 DIAGNOSIS — J449 Chronic obstructive pulmonary disease, unspecified: Secondary | ICD-10-CM | POA: Diagnosis not present

## 2017-04-23 ENCOUNTER — Ambulatory Visit: Payer: Self-pay

## 2017-04-24 DIAGNOSIS — J449 Chronic obstructive pulmonary disease, unspecified: Secondary | ICD-10-CM | POA: Diagnosis not present

## 2017-04-26 DIAGNOSIS — Z8673 Personal history of transient ischemic attack (TIA), and cerebral infarction without residual deficits: Secondary | ICD-10-CM | POA: Diagnosis not present

## 2017-04-26 DIAGNOSIS — Z95818 Presence of other cardiac implants and grafts: Secondary | ICD-10-CM | POA: Insufficient documentation

## 2017-05-07 ENCOUNTER — Ambulatory Visit: Payer: Self-pay

## 2017-05-17 DIAGNOSIS — J449 Chronic obstructive pulmonary disease, unspecified: Secondary | ICD-10-CM | POA: Diagnosis not present

## 2017-05-25 DIAGNOSIS — J449 Chronic obstructive pulmonary disease, unspecified: Secondary | ICD-10-CM | POA: Diagnosis not present

## 2017-05-26 ENCOUNTER — Other Ambulatory Visit: Payer: Self-pay | Admitting: Internal Medicine

## 2017-05-26 DIAGNOSIS — Z23 Encounter for immunization: Secondary | ICD-10-CM | POA: Diagnosis not present

## 2017-05-26 DIAGNOSIS — I10 Essential (primary) hypertension: Secondary | ICD-10-CM | POA: Diagnosis not present

## 2017-05-26 DIAGNOSIS — E119 Type 2 diabetes mellitus without complications: Secondary | ICD-10-CM | POA: Diagnosis not present

## 2017-05-26 DIAGNOSIS — I639 Cerebral infarction, unspecified: Secondary | ICD-10-CM | POA: Diagnosis not present

## 2017-05-26 DIAGNOSIS — R51 Headache: Secondary | ICD-10-CM | POA: Diagnosis not present

## 2017-05-28 DIAGNOSIS — E876 Hypokalemia: Secondary | ICD-10-CM | POA: Diagnosis not present

## 2017-06-02 DIAGNOSIS — H40013 Open angle with borderline findings, low risk, bilateral: Secondary | ICD-10-CM | POA: Diagnosis not present

## 2017-06-02 DIAGNOSIS — H5340 Unspecified visual field defects: Secondary | ICD-10-CM | POA: Diagnosis not present

## 2017-06-11 DIAGNOSIS — J449 Chronic obstructive pulmonary disease, unspecified: Secondary | ICD-10-CM | POA: Diagnosis not present

## 2017-06-12 HISTORY — PX: LOOP RECORDER IMPLANT: SHX5954

## 2017-06-17 DIAGNOSIS — E876 Hypokalemia: Secondary | ICD-10-CM | POA: Diagnosis not present

## 2017-06-17 DIAGNOSIS — I10 Essential (primary) hypertension: Secondary | ICD-10-CM | POA: Diagnosis not present

## 2017-06-24 DIAGNOSIS — J449 Chronic obstructive pulmonary disease, unspecified: Secondary | ICD-10-CM | POA: Diagnosis not present

## 2017-07-07 DIAGNOSIS — J449 Chronic obstructive pulmonary disease, unspecified: Secondary | ICD-10-CM | POA: Diagnosis not present

## 2017-07-12 DIAGNOSIS — I639 Cerebral infarction, unspecified: Secondary | ICD-10-CM | POA: Diagnosis not present

## 2017-07-25 DIAGNOSIS — J449 Chronic obstructive pulmonary disease, unspecified: Secondary | ICD-10-CM | POA: Diagnosis not present

## 2017-08-02 DIAGNOSIS — J449 Chronic obstructive pulmonary disease, unspecified: Secondary | ICD-10-CM | POA: Diagnosis not present

## 2017-08-04 ENCOUNTER — Ambulatory Visit: Payer: PPO | Admitting: Internal Medicine

## 2017-08-04 ENCOUNTER — Encounter: Payer: Self-pay | Admitting: Internal Medicine

## 2017-08-04 VITALS — BP 130/72 | HR 64 | Ht 66.0 in | Wt 215.6 lb

## 2017-08-04 DIAGNOSIS — F17200 Nicotine dependence, unspecified, uncomplicated: Secondary | ICD-10-CM

## 2017-08-04 DIAGNOSIS — J449 Chronic obstructive pulmonary disease, unspecified: Secondary | ICD-10-CM

## 2017-08-04 MED ORDER — FLUTICASONE PROPIONATE HFA 220 MCG/ACT IN AERO: 1.0000 | INHALATION_SPRAY | Freq: Two times a day (BID) | RESPIRATORY_TRACT | 12 refills | Status: DC

## 2017-08-04 NOTE — Progress Notes (Signed)
Subjective:     Patient ID: James Zuniga, male   DOB: November 02, 1955, 62 y.o.   MRN: [id number]  HPI   OV 11/12/2014  Chief Complaint  Patient presents with  . Follow-up    Pt states his breathing has worsened since last OV. pt c/o increase in SOB, dry cough and CP with activity.     COPD  (no nocturnal desat sept 2015) :   No interim flare up. LAst seen Sept 2015. Using spriiva and symbicort and a compliant fashion. However, he is reporting worsening shortness of breath in the last 4 months. It is moderate to severe in intensity. It is class III in severity. Relieved by rest. No associated chest pain or wheezing or cough. He has mild occasional sputum that is brown in color but no change. He is not reporting any other symptoms of COPD exacerbation. He continues to live a sedentary lifestyle. Of note he has been on testosterone for the last several months to a year. Most recent hemoglobin was 16 g percent [lab test personally reviewed). There is no associated syncope. Oxygen desaturation test in September 2015 was normal. We have advised him there was no need for oxygen use. He is really frustrated by his worsening dyspnea. He wants to change his inhalers around. Spiriva and Symbicort are helping but he wants to try better one. Reportedly normal cardiac workup with [place] cardiologist  Smoking:   reports that he quit smoking about 4 months ago. His smoking use included Cigarettes. He started smoking about 31 years ago. He has a 30 pack-year smoking history. He has never used smokeless tobacco.   Lung nodule 49mm - stable aug 2014 and 2015. Next is September 2016   OV 10/29/2015  Chief Complaint  Patient presents with  . Follow-up    Pt went to ED in mid March for COPD exacerbation. Pt states his SOB is not back to baseline and chest tightness when SOB. Pt denies cough.     COPD: COPD cat score is 26. Overall he feels that he is stable. I last saw him almost one year ago. In the interim he  saw my nurse practitioner. A year ago changed his inhalers around but it didn't really did not help his dyspnea. However he admits that his dyspnea is unchanged in the last few years. But he does feel miserable. He somewhat reluctantly agree to attend pulmonary rehabilitation because of his need to attend Dr. visits. In the inferior agreed to go and get evaluated. He might have difficulty attending pulmonary rehabilitation because of his antalgic gait and using cane. He also wants to change his nocturnal oxygen from a nasal cannula to facemask. Review of the records suggest that he might not be desaturating at night but he is reluctant to give up his oxygen.  Lung nodule: 4 mm right upper lobe. Was originally seen in August 2014. Stable as of January 2017  Smoking: He says he quit smoking 1 month ago.   OV 08/17/2016  Chief Complaint  Patient presents with  . Follow-up    COPD, he states his medications were getting too expensive for him, he does take Spiriva, uses proair more frequently, he states he is always tired and out of breath    Follow-up COPD not otherwise specified. He has a high symptom burden. He is not able to afford his inhalers. Most recently he could not afford Brio. Therefore he is using sample Spiriva from his primary care physician. He continues  to be miserable with class III dyspnea on exertion. He says he is a bad $ balance with the oxygen company and therefore they were not given the facemask. He currently uses oxygen at night.  4 mm right upper lobe lung nodule which is in August 2014 and stable in January 2017. Subsequent chest x-ray March 2017 is clear  \ OV 08/04/2017  Chief Complaint  Patient presents with  . Follow-up    Pt states he has been doing okay since last visit. C/o SOB with exertion especially when walking up stairs, and does have occ. CP. Denies any coughing.   Follow-up COPD not otherwise specified: He has a high symptom burden.  He is no longer on his  inhalers.  I personally have not seen him in 1 year.  In between in September 2018 he saw my nurse practitioner and qualified again for oxygen.  She is switched to nebulizers.  He is here with his significant other.  He cannot name his nebulizers.  But my nurse tells me this is albuterol and Atrovent nebulizer.  He says he takes 1 of them 2 times a day and another one 3 times a day.  Instead he should be taking both of them 4 times a day each.  He is not on any steroid inhalers.  His COPD symptom CAT score has increased although he denies being an exacerbation.  He uses oxygen at night.  Most recent chest x-ray in the middle of 2018 was clear according to the result.  He is up-to-date with his flu shot.  In terms of smoking: As of a year ago it was under remission for 11 months but he has restarted smoking.    CAT COPD Symptom & Quality of Life Score (GSK trademark) 0 is no burden. 5 is highest burden 10/29/2015  08/04/2017   Never Cough -> Cough all the time 3 3  No phlegm in chest -> Chest is full of phlegm 2 5  No chest tightness -> Chest feels very tight 3 3  No dyspnea for 1 flight stairs/[place] -> Very dyspneic for 1 flight of stairs 4 5  No limitations for ADL at home -> Very limited with ADL at home 3 5  Confident leaving home -> Not at all confident leaving home 3 3  Sleep soundly -> Do not sleep soundly because of lung condition 4 3  Lots of Energy -> No energy at all 4 5  TOTAL Score (max 40)  26 32     Current Outpatient Medications:  .  albuterol (PROVENTIL) (2.5 MG/3ML) 0.083% nebulizer solution, Take 2.5 mg by nebulization every 6 (six) hours as needed for wheezing or shortness of breath., Disp: , Rfl:  .  allopurinol (ZYLOPRIM) 300 MG tablet, Take 300 mg by mouth daily., Disp: , Rfl:  .  amLODipine (NORVASC) 5 MG tablet, , Disp: , Rfl:  .  aspirin 81 MG EC tablet, Take 81 mg by mouth daily. Swallow whole., Disp: , Rfl:  .  atorvastatin (LIPITOR) 80 MG tablet, Take 1 tablet (80  mg total) by mouth every morning., Disp: 30 tablet, Rfl: 0 .  clopidogrel (PLAVIX) 75 MG tablet, Take 1 tablet (75 mg total) by mouth daily., Disp: 30 tablet, Rfl: 2 .  DULoxetine (CYMBALTA) 60 MG capsule, Take 60 mg by mouth daily., Disp: , Rfl:  .  furosemide (LASIX) 20 MG tablet, Take 20 mg by mouth 2 (two) times a week. , Disp: , Rfl:  .  gabapentin (NEURONTIN) 300 MG capsule, Take 300 mg by mouth 3 (three) times daily. , Disp: , Rfl:  .  ipratropium-albuterol (DUONEB) 0.5-2.5 (3) MG/3ML SOLN, Take 3 mLs by nebulization every 6 (six) hours as needed. DX: J44.9, Disp: 360 mL, Rfl: 5 .  losartan (COZAAR) 100 MG tablet, Take 100 mg by mouth daily. , Disp: , Rfl:  .  metoprolol succinate (TOPROL-XL) 25 MG 24 hr tablet, Take 1 tablet (25 mg total) by mouth daily., Disp: 60 tablet, Rfl: 2 .  omeprazole (PRILOSEC) 20 MG capsule, Take 20 mg by mouth daily. , Disp: , Rfl: 0 .  OXYGEN, Inhale 2 L into the lungs as needed., Disp: , Rfl:  .  potassium chloride SA (K-DUR,KLOR-CON) 20 MEQ tablet, Take 1 tablet (20 mEq total) by mouth 2 (two) times daily., Disp: 30 tablet, Rfl: 3 .  primidone (MYSOLINE) 50 MG tablet, Take by mouth., Disp: , Rfl:  .  tamsulosin (FLOMAX) 0.4 MG CAPS capsule, Take 1 capsule (0.4 mg total) by mouth daily after breakfast., Disp: 30 capsule, Rfl: 12 .  tiZANidine (ZANAFLEX) 4 MG tablet, 4 mg 3 (three) times daily as needed for muscle spasms. , Disp: , Rfl: 0 .  traMADol (ULTRAM) 50 MG tablet, Take 50 mg by mouth daily as needed for moderate pain. , Disp: , Rfl:  .  zolpidem (AMBIEN) 10 MG tablet, Take 10 mg by mouth at bedtime. , Disp: , Rfl:  .  primidone (MYSOLINE) 50 MG tablet, Take 50 mg by mouth daily. , Disp: , Rfl:    Review of Systems     Objective:   Physical Exam  Constitutional: He is oriented to person, place, and time. He appears well-developed and well-nourished. No distress.  HENT:  Head: Normocephalic and atraumatic.  Right Ear: External ear normal.  Left  Ear: External ear normal.  Mouth/Throat: Oropharynx is clear and moist. No oropharyngeal exudate.  Eyes: Conjunctivae and EOM are normal. Pupils are equal, round, and reactive to light. Right eye exhibits no discharge. Left eye exhibits no discharge. No scleral icterus.  Neck: Normal range of motion. Neck supple. No JVD present. No tracheal deviation present. No thyromegaly present.  Cardiovascular: Normal rate, regular rhythm and intact distal pulses. Exam reveals no gallop and no friction rub.  No murmur heard. Pulmonary/Chest: Effort normal and breath sounds normal. No respiratory distress. He has no wheezes. He has no rales. He exhibits no tenderness.  Abdominal: Soft. Bowel sounds are normal. He exhibits no distension and no mass. There is no tenderness. There is no rebound and no guarding.  Musculoskeletal: Normal range of motion. He exhibits no edema or tenderness.  Uses cane  Lymphadenopathy:    He has no cervical adenopathy.  Neurological: He is alert and oriented to person, place, and time. He has normal reflexes. No cranial nerve deficit. Coordination normal.  Skin: Skin is warm and dry. No rash noted. He is not diaphoretic. No erythema. No pallor.  Psychiatric: He has a normal mood and affect. His behavior is normal. Judgment and thought content normal.  Nursing note and vitals reviewed.  Vitals:   08/04/17 0907  BP: 130/72  Pulse: 64  SpO2: 99%  Weight: 215 lb 9.6 oz (97.8 kg)  Height: 5\' 6"  (1.676 m)    Estimated body mass index is 34.8 kg/m as calculated from the following:   Height as of this encounter: 5\' 6"  (1.676 m).   Weight as of this encounter: 215 lb 9.6 oz (97.8 kg).  Assessment:       ICD-10-CM   1. Chronic obstructive pulmonary disease, unspecified COPD type ([place]) J44.9   2. Smoking F17.200        Plan:      Stable copd But you need to quit smoking otherwise copd will get worse and you can get lung cancer or heart attack You also need to take  your nebulizer correctly  -take albuterol and atrovent 4 times daily each Start inhaled steroid flovent 215mcg 1 puff twice daily   Followup 6 months or sooner if needed    Dr. Brand Males, M.D., [facility].C.P Pulmonary and Critical Care Medicine Staff Physician, [place] Director - Interstitial Lung Disease  Program  Pulmonary [place] [facility] [place], [place], [address]  Pager: [phone], If no answer or between  15:00h - 7:00h: call [phone] Telephone: [phone]

## 2017-08-04 NOTE — Patient Instructions (Addendum)
ICD-10-CM   1. Chronic obstructive pulmonary disease, unspecified COPD type ([place]) J44.9   2. Smoking F17.200     Stable copd But you need to quit smoking otherwise copd will get worse and you can get lung cancer or heart attack You also need to take your nebulizer correctly  -take albuterol and atrovent 4 times daily each Start inhaled steroid flovent 276mcg 1 puff twice daily   Followup 6 months or sooner if needed

## 2017-08-23 DIAGNOSIS — R413 Other amnesia: Secondary | ICD-10-CM | POA: Diagnosis not present

## 2017-08-23 DIAGNOSIS — Z8673 Personal history of transient ischemic attack (TIA), and cerebral infarction without residual deficits: Secondary | ICD-10-CM | POA: Diagnosis not present

## 2017-08-23 DIAGNOSIS — I639 Cerebral infarction, unspecified: Secondary | ICD-10-CM | POA: Diagnosis not present

## 2017-08-23 DIAGNOSIS — Z9181 History of falling: Secondary | ICD-10-CM | POA: Diagnosis not present

## 2017-08-23 DIAGNOSIS — E118 Type 2 diabetes mellitus with unspecified complications: Secondary | ICD-10-CM | POA: Diagnosis not present

## 2017-08-24 ENCOUNTER — Other Ambulatory Visit: Payer: Self-pay

## 2017-08-24 ENCOUNTER — Encounter (HOSPITAL_COMMUNITY): Payer: Self-pay | Admitting: *Deleted

## 2017-08-24 ENCOUNTER — Emergency Department (HOSPITAL_COMMUNITY): Payer: PPO

## 2017-08-24 ENCOUNTER — Inpatient Hospital Stay (HOSPITAL_COMMUNITY)
Admission: EM | Admit: 2017-08-24 | Discharge: 2017-08-26 | DRG: 377 | Disposition: A | Discharge status: Home / Self Care | Payer: PPO | Attending: Internal Medicine | Admitting: Internal Medicine

## 2017-08-24 DIAGNOSIS — I10 Essential (primary) hypertension: Secondary | ICD-10-CM | POA: Diagnosis not present

## 2017-08-24 DIAGNOSIS — I110 Hypertensive heart disease with heart failure: Secondary | ICD-10-CM | POA: Diagnosis present

## 2017-08-24 DIAGNOSIS — Z79891 Long term (current) use of opiate analgesic: Secondary | ICD-10-CM | POA: Diagnosis not present

## 2017-08-24 DIAGNOSIS — Z7901 Long term (current) use of anticoagulants: Secondary | ICD-10-CM | POA: Diagnosis not present

## 2017-08-24 DIAGNOSIS — E1165 Type 2 diabetes mellitus with hyperglycemia: Secondary | ICD-10-CM | POA: Diagnosis not present

## 2017-08-24 DIAGNOSIS — Z7902 Long term (current) use of antithrombotics/antiplatelets: Secondary | ICD-10-CM

## 2017-08-24 DIAGNOSIS — R410 Disorientation, unspecified: Secondary | ICD-10-CM | POA: Diagnosis not present

## 2017-08-24 DIAGNOSIS — J449 Chronic obstructive pulmonary disease, unspecified: Secondary | ICD-10-CM | POA: Diagnosis present

## 2017-08-24 DIAGNOSIS — Z7982 Long term (current) use of aspirin: Secondary | ICD-10-CM | POA: Diagnosis not present

## 2017-08-24 DIAGNOSIS — F17210 Nicotine dependence, cigarettes, uncomplicated: Secondary | ICD-10-CM | POA: Diagnosis present

## 2017-08-24 DIAGNOSIS — Z79899 Other long term (current) drug therapy: Secondary | ICD-10-CM

## 2017-08-24 DIAGNOSIS — G8929 Other chronic pain: Secondary | ICD-10-CM | POA: Diagnosis present

## 2017-08-24 DIAGNOSIS — E876 Hypokalemia: Secondary | ICD-10-CM | POA: Diagnosis not present

## 2017-08-24 DIAGNOSIS — E785 Hyperlipidemia, unspecified: Secondary | ICD-10-CM | POA: Diagnosis present

## 2017-08-24 DIAGNOSIS — K219 Gastro-esophageal reflux disease without esophagitis: Secondary | ICD-10-CM | POA: Diagnosis present

## 2017-08-24 DIAGNOSIS — Z794 Long term (current) use of insulin: Secondary | ICD-10-CM

## 2017-08-24 DIAGNOSIS — I5022 Chronic systolic (congestive) heart failure: Secondary | ICD-10-CM | POA: Diagnosis present

## 2017-08-24 DIAGNOSIS — K921 Melena: Secondary | ICD-10-CM | POA: Diagnosis present

## 2017-08-24 DIAGNOSIS — K317 Polyp of stomach and duodenum: Secondary | ICD-10-CM

## 2017-08-24 DIAGNOSIS — Z8673 Personal history of transient ischemic attack (TIA), and cerebral infarction without residual deficits: Secondary | ICD-10-CM

## 2017-08-24 DIAGNOSIS — R195 Other fecal abnormalities: Secondary | ICD-10-CM

## 2017-08-24 DIAGNOSIS — G4700 Insomnia, unspecified: Secondary | ICD-10-CM | POA: Diagnosis present

## 2017-08-24 DIAGNOSIS — D509 Iron deficiency anemia, unspecified: Secondary | ICD-10-CM | POA: Diagnosis not present

## 2017-08-24 DIAGNOSIS — E1100 Type 2 diabetes mellitus with hyperosmolarity without nonketotic hyperglycemic-hyperosmolar coma (NKHHC): Secondary | ICD-10-CM | POA: Diagnosis present

## 2017-08-24 DIAGNOSIS — G4733 Obstructive sleep apnea (adult) (pediatric): Secondary | ICD-10-CM | POA: Diagnosis present

## 2017-08-24 DIAGNOSIS — R739 Hyperglycemia, unspecified: Secondary | ICD-10-CM | POA: Diagnosis present

## 2017-08-24 DIAGNOSIS — D649 Anemia, unspecified: Secondary | ICD-10-CM | POA: Diagnosis not present

## 2017-08-24 DIAGNOSIS — I2510 Atherosclerotic heart disease of native coronary artery without angina pectoris: Secondary | ICD-10-CM | POA: Diagnosis present

## 2017-08-24 DIAGNOSIS — D500 Iron deficiency anemia secondary to blood loss (chronic): Secondary | ICD-10-CM | POA: Diagnosis present

## 2017-08-24 DIAGNOSIS — I4891 Unspecified atrial fibrillation: Secondary | ICD-10-CM | POA: Diagnosis present

## 2017-08-24 DIAGNOSIS — E1151 Type 2 diabetes mellitus with diabetic peripheral angiopathy without gangrene: Secondary | ICD-10-CM | POA: Diagnosis present

## 2017-08-24 DIAGNOSIS — K922 Gastrointestinal hemorrhage, unspecified: Secondary | ICD-10-CM | POA: Diagnosis not present

## 2017-08-24 DIAGNOSIS — M549 Dorsalgia, unspecified: Secondary | ICD-10-CM | POA: Diagnosis present

## 2017-08-24 DIAGNOSIS — Z91120 Patient's intentional underdosing of medication regimen due to financial hardship: Secondary | ICD-10-CM | POA: Diagnosis not present

## 2017-08-24 DIAGNOSIS — I509 Heart failure, unspecified: Secondary | ICD-10-CM | POA: Diagnosis present

## 2017-08-24 DIAGNOSIS — R42 Dizziness and giddiness: Secondary | ICD-10-CM | POA: Diagnosis not present

## 2017-08-24 DIAGNOSIS — M109 Gout, unspecified: Secondary | ICD-10-CM | POA: Diagnosis present

## 2017-08-24 DIAGNOSIS — R531 Weakness: Secondary | ICD-10-CM | POA: Diagnosis not present

## 2017-08-24 DIAGNOSIS — I739 Peripheral vascular disease, unspecified: Secondary | ICD-10-CM | POA: Diagnosis not present

## 2017-08-24 DIAGNOSIS — Z7984 Long term (current) use of oral hypoglycemic drugs: Secondary | ICD-10-CM

## 2017-08-24 DIAGNOSIS — T383X6A Underdosing of insulin and oral hypoglycemic [antidiabetic] drugs, initial encounter: Secondary | ICD-10-CM | POA: Diagnosis not present

## 2017-08-24 HISTORY — DX: Unspecified osteoarthritis, unspecified site: M19.90

## 2017-08-24 HISTORY — DX: Dependence on supplemental oxygen: Z99.81

## 2017-08-24 HISTORY — DX: Dependence on other enabling machines and devices: Z99.89

## 2017-08-24 HISTORY — DX: Low back pain, unspecified: M54.50

## 2017-08-24 HISTORY — DX: Other chronic pain: G89.29

## 2017-08-24 HISTORY — DX: Obstructive sleep apnea (adult) (pediatric): G47.33

## 2017-08-24 HISTORY — DX: Type 2 diabetes mellitus without complications: E11.9

## 2017-08-24 HISTORY — DX: Low back pain: M54.5

## 2017-08-24 LAB — BASIC METABOLIC PANEL
ANION GAP: 13 (ref 5–15)
ANION GAP: 9 (ref 5–15)
BUN: 9 mg/dL (ref 6–20)
BUN: 7 mg/dL (ref 6–20)
CALCIUM: 9.5 mg/dL (ref 8.9–10.3)
CALCIUM: 9.2 mg/dL (ref 8.9–10.3)
CO2: 23 mmol/L (ref 22–32)
CO2: 26 mmol/L (ref 22–32)
Chloride: 94 mmol/L — ABNORMAL LOW (ref 101–111)
Chloride: 106 mmol/L (ref 101–111)
Creatinine, Ser: 1.35 mg/dL — ABNORMAL HIGH (ref 0.61–1.24)
Creatinine, Ser: 1.18 mg/dL (ref 0.61–1.24)
GFR calc Af Amer: >60 mL/min (ref >60)
GFR calc non Af Amer: >60 mL/min (ref >60)
GFR, EST NON AFRICAN AMERICAN: 55 mL/min — AB (ref >60)
GLUCOSE: 898 mg/dL — AB (ref 65–99)
GLUCOSE: 141 mg/dL — AB (ref 65–99)
POTASSIUM: 3.5 mmol/L (ref 3.5–5.1)
Potassium: 3.2 mmol/L — ABNORMAL LOW (ref 3.5–5.1)
Sodium: 130 mmol/L — ABNORMAL LOW (ref 135–145)
Sodium: 141 mmol/L (ref 135–145)

## 2017-08-24 LAB — OSMOLALITY: OSMOLALITY: 319 mosm/kg — AB (ref 275–295)

## 2017-08-24 LAB — BLOOD GAS, VENOUS

## 2017-08-24 LAB — HEPATIC FUNCTION PANEL
ALBUMIN: 3.2 g/dL — AB (ref 3.5–5.0)
ALK PHOS: 189 U/L — AB (ref 38–126)
ALT: 15 U/L — ABNORMAL LOW (ref 17–63)
AST: 18 U/L (ref 15–41)
BILIRUBIN TOTAL: 0.4 mg/dL (ref 0.3–1.2)
Bilirubin, Direct: <0.1 mg/dL — ABNORMAL LOW (ref 0.1–0.5)
TOTAL PROTEIN: 7.2 g/dL (ref 6.5–8.1)

## 2017-08-24 LAB — IRON AND TIBC
IRON: 20 ug/dL — AB (ref 45–182)
SATURATION RATIOS: 5 % — AB (ref 17.9–39.5)
TIBC: 378 ug/dL (ref 250–450)
UIBC: 358 ug/dL

## 2017-08-24 LAB — POC OCCULT BLOOD, ED: FECAL OCCULT BLD: POSITIVE — AB

## 2017-08-24 LAB — PROTIME-INR
INR: 1.02
PROTHROMBIN TIME: 13.3 s (ref 11.4–15.2)

## 2017-08-24 LAB — URINALYSIS, ROUTINE W REFLEX MICROSCOPIC
BILIRUBIN URINE: NEGATIVE
Bacteria, UA: NONE SEEN
Glucose, UA: >=500 mg/dL — AB
HGB URINE DIPSTICK: NEGATIVE
KETONES UR: NEGATIVE mg/dL
LEUKOCYTES UA: NEGATIVE
Nitrite: NEGATIVE
PROTEIN: NEGATIVE mg/dL
SPECIFIC GRAVITY, URINE: 1.018 (ref 1.005–1.030)
Squamous Epithelial / LPF: NONE SEEN
pH: 7 (ref 5.0–8.0)

## 2017-08-24 LAB — I-STAT VENOUS BLOOD GAS, ED
Acid-Base Excess: 5 mmol/L — ABNORMAL HIGH (ref 0.0–2.0)
Bicarbonate: 29.1 mmol/L — ABNORMAL HIGH (ref 20.0–28.0)
O2 Saturation: 89 %
PCO2 VEN: 42.6 mmHg — AB (ref 44.0–60.0)
PH VEN: 7.442 — AB (ref 7.250–7.430)
TCO2: 30 mmol/L (ref 22–32)
pO2, Ven: 55 mmHg — ABNORMAL HIGH (ref 32.0–45.0)

## 2017-08-24 LAB — CBC
HEMATOCRIT: 27.6 % — AB (ref 39.0–52.0)
HEMOGLOBIN: 8.1 g/dL — AB (ref 13.0–17.0)
MCH: 23.8 pg — ABNORMAL LOW (ref 26.0–34.0)
MCHC: 29.3 g/dL — AB (ref 30.0–36.0)
MCV: 81.2 fL (ref 78.0–100.0)
Platelets: 325 10*3/uL (ref 150–400)
RBC: 3.4 MIL/uL — AB (ref 4.22–5.81)
RDW: 15.9 % — ABNORMAL HIGH (ref 11.5–15.5)
WBC: 6.2 10*3/uL (ref 4.0–10.5)

## 2017-08-24 LAB — TYPE AND SCREEN
ABO/RH(D): O POS
Antibody Screen: NEGATIVE

## 2017-08-24 LAB — FERRITIN: FERRITIN: 8 ng/mL — AB (ref 24–336)

## 2017-08-24 LAB — CBG MONITORING, ED
GLUCOSE-CAPILLARY: 519 mg/dL — AB (ref 65–99)
GLUCOSE-CAPILLARY: 278 mg/dL — AB (ref 65–99)
GLUCOSE-CAPILLARY: 212 mg/dL — AB (ref 65–99)
GLUCOSE-CAPILLARY: 131 mg/dL — AB (ref 65–99)
Glucose-Capillary: 408 mg/dL — ABNORMAL HIGH (ref 65–99)
Glucose-Capillary: 336 mg/dL — ABNORMAL HIGH (ref 65–99)
Glucose-Capillary: 242 mg/dL — ABNORMAL HIGH (ref 65–99)

## 2017-08-24 LAB — GLUCOSE, CAPILLARY
GLUCOSE-CAPILLARY: 202 mg/dL — AB (ref 65–99)
GLUCOSE-CAPILLARY: 199 mg/dL — AB (ref 65–99)
Glucose-Capillary: 93 mg/dL (ref 65–99)
Glucose-Capillary: 163 mg/dL — ABNORMAL HIGH (ref 65–99)

## 2017-08-24 LAB — I-STAT TROPONIN, ED: Troponin i, poc: 0.01 ng/mL (ref 0.00–0.08)

## 2017-08-24 LAB — APTT: aPTT: 27 seconds (ref 24–36)

## 2017-08-24 LAB — ABO/RH: ABO/RH(D): O POS

## 2017-08-24 LAB — MRSA PCR SCREENING: MRSA by PCR: NEGATIVE

## 2017-08-24 MED ORDER — ZOLPIDEM TARTRATE 5 MG PO TABS
10.0000 mg | ORAL_TABLET | Freq: Every day | ORAL | Status: DC
  Administered 2017-08-24 – 2017-08-25 (×2): 10 mg via ORAL
  Filled 2017-08-24 (×2): qty 2

## 2017-08-24 MED ORDER — PANTOPRAZOLE SODIUM 40 MG PO TBEC
40.0000 mg | DELAYED_RELEASE_TABLET | Freq: Every day | ORAL | Status: DC
  Administered 2017-08-25 – 2017-08-26 (×2): 40 mg via ORAL
  Filled 2017-08-24 (×2): qty 1

## 2017-08-24 MED ORDER — INSULIN GLARGINE 100 UNIT/ML ~~LOC~~ SOLN
15.0000 [IU] | Freq: Every day | SUBCUTANEOUS | Status: DC
  Administered 2017-08-24: 15 [IU] via SUBCUTANEOUS
  Filled 2017-08-24: qty 0.15

## 2017-08-24 MED ORDER — INSULIN ASPART 100 UNIT/ML ~~LOC~~ SOLN
0.0000 [IU] | Freq: Three times a day (TID) | SUBCUTANEOUS | Status: DC
Start: 2017-08-25 — End: 2017-08-26
  Administered 2017-08-25: 5 [IU] via SUBCUTANEOUS
  Administered 2017-08-25: 11 [IU] via SUBCUTANEOUS
  Administered 2017-08-25: 15 [IU] via SUBCUTANEOUS
  Administered 2017-08-26 (×2): 2 [IU] via SUBCUTANEOUS

## 2017-08-24 MED ORDER — PRIMIDONE 50 MG PO TABS
50.0000 mg | ORAL_TABLET | Freq: Every day | ORAL | Status: DC
  Administered 2017-08-24 – 2017-08-25 (×2): 50 mg via ORAL
  Filled 2017-08-24 (×3): qty 1

## 2017-08-24 MED ORDER — SODIUM CHLORIDE 0.9 % IV SOLN
INTRAVENOUS | Status: AC
  Administered 2017-08-24: 5.4 [IU]/h via INTRAVENOUS
  Filled 2017-08-24: qty 1

## 2017-08-24 MED ORDER — SODIUM CHLORIDE 0.9 % IV SOLN
INTRAVENOUS | Status: DC
  Administered 2017-08-24: 11:00:00 via INTRAVENOUS

## 2017-08-24 MED ORDER — FLUTICASONE PROPIONATE HFA 220 MCG/ACT IN AERO: 1.0000 | INHALATION_SPRAY | Freq: Two times a day (BID) | RESPIRATORY_TRACT | Status: DC

## 2017-08-24 MED ORDER — SODIUM CHLORIDE 0.9% FLUSH
3.0000 mL | Freq: Two times a day (BID) | INTRAVENOUS | Status: DC
  Administered 2017-08-24 – 2017-08-26 (×3): 3 mL via INTRAVENOUS

## 2017-08-24 MED ORDER — ACETAMINOPHEN 325 MG PO TABS: 650.0000 mg | ORAL_TABLET | Freq: Four times a day (QID) | ORAL | Status: DC | PRN

## 2017-08-24 MED ORDER — LIVING WELL WITH DIABETES BOOK
Freq: Once | Status: AC
  Administered 2017-08-25: 07:00:00
  Filled 2017-08-24: qty 1

## 2017-08-24 MED ORDER — SODIUM CHLORIDE 0.9 % IV BOLUS (SEPSIS)
1000.0000 mL | Freq: Once | INTRAVENOUS | Status: AC
  Administered 2017-08-24: 1000 mL via INTRAVENOUS

## 2017-08-24 MED ORDER — TRAMADOL HCL 50 MG PO TABS
50.0000 mg | ORAL_TABLET | Freq: Every day | ORAL | Status: DC | PRN
  Administered 2017-08-25 (×3): 50 mg via ORAL
  Filled 2017-08-24 (×3): qty 1

## 2017-08-24 MED ORDER — DULOXETINE HCL 60 MG PO CPEP
60.0000 mg | ORAL_CAPSULE | Freq: Every day | ORAL | Status: DC
  Administered 2017-08-25 – 2017-08-26 (×2): 60 mg via ORAL
  Filled 2017-08-24 (×2): qty 1

## 2017-08-24 MED ORDER — SENNOSIDES-DOCUSATE SODIUM 8.6-50 MG PO TABS: 1.0000 | ORAL_TABLET | Freq: Every evening | ORAL | Status: DC | PRN

## 2017-08-24 MED ORDER — ALLOPURINOL 300 MG PO TABS
300.0000 mg | ORAL_TABLET | Freq: Every day | ORAL | Status: DC
  Administered 2017-08-25 – 2017-08-26 (×2): 300 mg via ORAL
  Filled 2017-08-24 (×2): qty 1

## 2017-08-24 MED ORDER — POTASSIUM CHLORIDE CRYS ER 20 MEQ PO TBCR
30.0000 meq | EXTENDED_RELEASE_TABLET | ORAL | Status: AC
  Administered 2017-08-24: 21:00:00 30 meq via ORAL
  Filled 2017-08-24: qty 1

## 2017-08-24 MED ORDER — ALBUTEROL SULFATE (2.5 MG/3ML) 0.083% IN NEBU: 2.5000 mg | INHALATION_SOLUTION | Freq: Four times a day (QID) | RESPIRATORY_TRACT | Status: DC | PRN

## 2017-08-24 MED ORDER — INSULIN ASPART 100 UNIT/ML ~~LOC~~ SOLN
0.0000 [IU] | Freq: Every day | SUBCUTANEOUS | Status: DC
  Administered 2017-08-25: 3 [IU] via SUBCUTANEOUS

## 2017-08-24 MED ORDER — ATORVASTATIN CALCIUM 80 MG PO TABS
80.0000 mg | ORAL_TABLET | Freq: Every day | ORAL | Status: DC
  Administered 2017-08-25 – 2017-08-26 (×2): 80 mg via ORAL
  Filled 2017-08-24 (×2): qty 1

## 2017-08-24 MED ORDER — DEXTROSE-NACL 5-0.45 % IV SOLN
INTRAVENOUS | Status: DC
  Administered 2017-08-24: 18:00:00 via INTRAVENOUS

## 2017-08-24 MED ORDER — GABAPENTIN 300 MG PO CAPS
300.0000 mg | ORAL_CAPSULE | Freq: Three times a day (TID) | ORAL | Status: DC
  Administered 2017-08-24 – 2017-08-26 (×6): 300 mg via ORAL
  Filled 2017-08-24 (×6): qty 1

## 2017-08-24 MED ORDER — IPRATROPIUM-ALBUTEROL 0.5-2.5 (3) MG/3ML IN SOLN: 3.0000 mL | Freq: Four times a day (QID) | RESPIRATORY_TRACT | Status: DC | PRN

## 2017-08-24 MED ORDER — ACETAMINOPHEN 650 MG RE SUPP: 650.0000 mg | Freq: Four times a day (QID) | RECTAL | Status: DC | PRN

## 2017-08-24 MED ORDER — INSULIN GLARGINE 100 UNIT/ML ~~LOC~~ SOLN
15.0000 [IU] | Freq: Every day | SUBCUTANEOUS | Status: DC
  Filled 2017-08-24: qty 0.15

## 2017-08-24 MED ORDER — ENSURE ENLIVE PO LIQD: 237.0000 mL | Freq: Two times a day (BID) | ORAL | Status: DC

## 2017-08-24 MED ORDER — METOPROLOL SUCCINATE ER 25 MG PO TB24
25.0000 mg | ORAL_TABLET | Freq: Every day | ORAL | Status: DC
  Administered 2017-08-25 – 2017-08-26 (×2): 25 mg via ORAL
  Filled 2017-08-24 (×2): qty 1

## 2017-08-24 MED ORDER — LOSARTAN POTASSIUM 50 MG PO TABS
100.0000 mg | ORAL_TABLET | Freq: Every day | ORAL | Status: DC
  Administered 2017-08-25: 50 mg via ORAL
  Administered 2017-08-26: 100 mg via ORAL
  Filled 2017-08-24 (×2): qty 2

## 2017-08-24 MED ORDER — TAMSULOSIN HCL 0.4 MG PO CAPS
0.4000 mg | ORAL_CAPSULE | Freq: Every day | ORAL | Status: DC
  Administered 2017-08-25 – 2017-08-26 (×2): 0.4 mg via ORAL
  Filled 2017-08-24 (×4): qty 1

## 2017-08-24 NOTE — ED Provider Notes (Signed)
Medical screening examination/treatment/procedure(s) were conducted as a shared visit with non-physician practitioner(s) and myself.  I personally evaluated the patient during the encounter. Briefly, the patient is a 62 y.o. male with a history of CHF, prior CVA resulting in left-sided deficits, diabetes who is uncontrolled due to financial constraints presents to the emergency department with unsteady gait and fogginess for 2 weeks.  Patient was seen yesterday for the symptoms and found to be hyperglycemic with anemia and instructed to present to the emergency department.  Workup here revealed hyperglycemia without evidence of DKA but was concerning for HHS given his mental changes.  There are no acute neurologic deficits on exam.  Patient was Hemoccult positive with a hemoglobin of 8 which is a 4 g drop since the last check 8 months ago.  Also noted to have hypokalemia that was repleted.  Treated with IV insulin and fluids.  Patient admitted for further workup.   EKG Interpretation  Date/Time:  Tuesday August 24 2017 11:04:16 EST Ventricular Rate:  69 PR Interval:    QRS Duration: 91 QT Interval:  421 QTC Calculation: 451 R Axis:   -37 Text Interpretation:  Sinus rhythm Left axis deviation Low voltage, precordial leads Abnormal R-wave progression, late transition Borderline T abnormalities, anterior leads Baseline wander in lead(s) V1 V2 V4 V5 V6 NO STEMI Confirmed by Addison Lank [id number]) on 08/24/2017 11:27:47 AM       CRITICAL CARE Performed by: Grayce Sessions Cardama Total critical care time: 10 minutes Critical care time was exclusive of separately billable procedures and treating other patients. Critical care was necessary to treat or prevent imminent or life-threatening deterioration. Critical care was time spent personally by me on the following activities: development of treatment plan with patient and/or surrogate as well as nursing, discussions with consultants, evaluation of  patient's response to treatment, examination of patient, obtaining history from patient or surrogate, ordering and performing treatments and interventions, ordering and review of laboratory studies, ordering and review of radiographic studies, pulse oximetry and re-evaluation of patient's condition.       Fatima Blank, MD 08/25/17 1745

## 2017-08-24 NOTE — ED Triage Notes (Signed)
Pt reports increased thirst and incontinence. Pt was sent by his PCP for elevated blood sugar. Pt is a diabetic but has not been taking insulin due to financial issues

## 2017-08-24 NOTE — ED Notes (Signed)
ED Provider at bedside. 

## 2017-08-24 NOTE — ED Notes (Signed)
Pt reports that he has not been checking his sugar at home and has not been taking insulin since October r/t cost of $600.

## 2017-08-24 NOTE — ED Notes (Signed)
Urine sample in mini lab

## 2017-08-24 NOTE — ED Notes (Signed)
Date and time results received: 08/24/17 9:48 AM    Test: Glucose  Critical Value: 898  Name of Provider Notified: Arlean Hopping PA  Orders Received

## 2017-08-24 NOTE — ED Notes (Signed)
CBG >600; RN aware 

## 2017-08-24 NOTE — Progress Notes (Signed)
Notified MD on to verify insulin gtt  and  IVF orders. I gave Lantus first then waited 2 hours to d/c gtt and fluids. I will continue to to monitor patient as needed.

## 2017-08-24 NOTE — ED Notes (Signed)
Patient transported to CT 

## 2017-08-24 NOTE — Consult Note (Signed)
[facility] Consult: 12:50 PM 08/24/2017  LOS: 0 days    Referring Provider: Arlean Hopping ED PA-C Primary Care Physician:  Berkley Harvey, NP of [facility] network Primary Gastroenterologist:  [facility] [place], formerly [facility], now part of [facility]/      Reason for Consultation:  Anemia.  Dark FOBT + stool     IMPRESSION:   *  Anemia with declining MCV and Hgb c/w 8 months ago.  Rule out upper GI bleed: Gastritis, ulcers.  Takes daily low dose Omeprazole at home.  Screening colonoscopy unremarkable 2014.    *  Chronic ASA/Plavix for hx cryptogenic CVA, ? Afib.  Last dose of these medications was 2/12 AM.   Cardiologist is Adrian Prows in the [facility] system.    *  S/p implanted loop recorder 04/2017.    *  NIDDM.  Sugars not well controlled.  Drinks 2 liters pepsi per day.  Unable to afford insulin as 4 months ago.  Currently on insulin drip.  *  OSA on CPAP.  COPD.  Former smoker.     PLAN:     *  Should undergo EGD, but it is not urgent.  Will let him eat carb mod diet.  Hold Plavix for now. Dr Carlean Purl to see pt later.      Azucena Freed  08/24/2017, 12:50 PM Pager: [phone]     [facility] Attending   I have taken an interval history, reviewed the chart and examined the patient. I agree with the Advanced Practitioner's note, impression and recommendations.   Ferritin 9.2 other day outpatient labs in care everywhere  So Fe defic anemia, will need at least an EGD, colonoscopy 5 years ago so given that he is on reg PPI seems like PUD unliekely and interval of 5 yrs means will need redo colonoscopy   Need to control BS before procedures  Will f/u  Gatha Mayer, MD, [facility] [phone] (pager) 08/24/2017 4:32 PM   HPI: ADVAIT BUICE is a 62 y.o. male.  PMH non-schemic CHF, EF 40 to 45% as of 12/2016.  OSA, COPD.  Type 2 DM.  CVA.  02/2017 placement of implanted cardiac monitor.  Iron def anemia dating to at least 2014.   Vitamin D deficient 2013.  Hep ABC serologies negative 02/2013.  Lipomas on back.  Chronic pain. Takes Plavix and 81 ASA and he last took these medications this morning. 04/2013 Colonoscopy, screening study.  At [facility] in [place].  "The entire examined colon is normal.  Repeat in 5-10 years". No previous upper endoscopy.  Weight 212# 12/2016 Patient reports 3 separate episodes of dark but not bloody or melenic stools.  The first was about 2 or 3 weeks ago, the latest was last week.  Generally has BMs every 2-3 days in between these dark stools, his stools have been brown.  Wife relays decreased appetite within the last few weeks.  Patient denies nausea, queasiness, early satiety, abdominal pain.  Patient has been very thirsty and drinks  2 L of non-diet Pepsi a day, not consuming much in the way of water.  + polyuria and urinary incontinence.  He gets short of breath when he walks up stairs or has to walk more than 20 feet.  In the last few days he has been having positional dizziness when he stands up.  Has not noticed any chest pain or palpitations.  Chronic lower extremity edema is unchanged.  Patient was seen at his doctor's office yesterday and had lab work drawn.  MD office called the patient today saying his sugars were very elevated at that he needed to go to the emergency department Today in [facility] emergency department following is noted. Hgb 8.1, MCV 81.  Was ~ 14.5 - 15 and 91 in 12/2016.  FOBT +.  coags normal.   Glucose is 898.  Mild AKI.   Alk phos 189 but t bili, ast/alt are normal.   CT head: atrophy and small vsl dz.  CXR unremarkable.    Home meds include Omeprazole 20 mg daily.   Has not been taking prescribed insulin since 04/2017 because of the cost.  Had not checked his blood sugar  for a couple of weeks because his meter broke.  Patient says prior to that that his sugars were in the low 1 teens.   Patient does not consume alcoholic beverages and denies history of heavy alcohol use.  No use of NSAIDs or aspirin other than the low-dose aspirin.   No nose or oral bleeding.  No significant purpura/bruising/excessive bleeding from his skin."  Disorder of the liver" is listed on past medical history, the patient is unaware of any previous liver issues and there is nothing mentioned care everywhere.   Past Medical History:  Diagnosis Date  . CHF (congestive heart failure) ([place])   . Chronic airway obstruction ([place])   . Chronic pain   . Coronary artery disease   . Diabetes mellitus   . Disorder of liver   . Esophageal reflux   . Gout   . Hyperlipidemia   . Hypertension   . Hypokalemia 11/2016  . Hypotestosteronism 07/01/2013  . Insomnia   . Iron deficiency anemia, unspecified 07/01/2013  . Memory loss   . PVD (peripheral vascular disease) ([place])   . Renal disorder   . Sleep apnea   . Stroke ([place])   . Vitamin D deficiency     Past Surgical History:  Procedure Laterality Date  . KNEE ARTHROSCOPY    . TEE WITHOUT CARDIOVERSION N/A 12/24/2016   Procedure: TRANSESOPHAGEAL ECHOCARDIOGRAM (TEE);  Surgeon: Fay Records, MD;  Location: [place];  Service: Cardiovascular;  Laterality: N/A;    Prior to Admission medications   Medication Sig Start Date End Date Taking? Authorizing Provider  albuterol (PROVENTIL) (2.5 MG/3ML) 0.083% nebulizer solution Take 2.5 mg by nebulization every 6 (six) hours as needed for wheezing or shortness of breath. 07/27/16   [provider]  allopurinol (ZYLOPRIM) 300 MG tablet Take 300 mg by mouth daily.    [provider]  amLODipine (NORVASC) 5 MG tablet  07/14/17   [provider]  aspirin 81 MG EC tablet Take 81 mg by mouth daily. Swallow whole.    [provider]  atorvastatin (LIPITOR) 80 MG tablet  Take 1 tablet (80 mg total) by mouth every morning. 12/25/16   Alphonzo Grieve, MD  clopidogrel (PLAVIX) 75 MG tablet Take 1 tablet (75 mg total) by mouth daily. 12/26/16   Alphonzo Grieve, MD  DULoxetine (CYMBALTA)  60 MG capsule Take 60 mg by mouth daily.    [provider]  fluticasone (FLOVENT HFA) 220 MCG/ACT inhaler Inhale 1 puff into the lungs 2 (two) times daily. 08/04/17   Brand Males, MD  furosemide (LASIX) 20 MG tablet Take 20 mg by mouth 2 (two) times a week.  03/21/13   [provider]  gabapentin (NEURONTIN) 300 MG capsule Take 300 mg by mouth 3 (three) times daily.  10/18/14   [provider]  ipratropium-albuterol (DUONEB) 0.5-2.5 (3) MG/3ML SOLN Take 3 mLs by nebulization every 6 (six) hours as needed. DX: J44.9 03/25/17   Donita Brooks, NP  losartan (COZAAR) 100 MG tablet Take 100 mg by mouth daily.  10/15/16   [provider]  metoprolol succinate (TOPROL-XL) 25 MG 24 hr tablet Take 1 tablet (25 mg total) by mouth daily. 12/26/16   Alphonzo Grieve, MD  omeprazole (PRILOSEC) 20 MG capsule Take 20 mg by mouth daily.  07/19/14   [provider]  OXYGEN Inhale 2 L into the lungs as needed.    [provider]  potassium chloride SA (K-DUR,KLOR-CON) 20 MEQ tablet Take 1 tablet (20 mEq total) by mouth 2 (two) times daily. 05/03/15   Volanda Napoleon, MD  primidone (MYSOLINE) 50 MG tablet Take 50 mg by mouth daily.  06/30/16 06/30/17  [provider]  primidone (MYSOLINE) 50 MG tablet Take by mouth. 01/26/17   [provider]  tamsulosin (FLOMAX) 0.4 MG CAPS capsule Take 1 capsule (0.4 mg total) by mouth daily after breakfast. 12/06/15   Volanda Napoleon, MD  tiZANidine (ZANAFLEX) 4 MG tablet 4 mg 3 (three) times daily as needed for muscle spasms.  10/04/14   [provider]  traMADol (ULTRAM) 50 MG tablet Take 50 mg by mouth daily as needed for moderate pain.  09/08/13   [provider]  zolpidem (AMBIEN) 10  MG tablet Take 10 mg by mouth at bedtime.  09/02/15   [provider]    Scheduled Meds:  Infusions: . sodium chloride 125 mL/hr at 08/24/17 1034  . dextrose 5 % and 0.45% NaCl Stopped (08/24/17 1001)  . insulin (NOVOLIN-R) infusion 3.5 Units/hr (08/24/17 1236)   PRN Meds:    Allergies as of 08/24/2017  . (No Known Allergies)    Family History  Problem Relation Age of Onset  . Heart disease Mother   . Diabetes Father   . Stroke Brother   . Stroke Paternal Uncle     Social History   Socioeconomic History  . Marital status: Married    Spouse name: Not on file  . Number of children: Not on file  . Years of education: Not on file  . Highest education level: Not on file  Social Needs  . Financial resource strain: Not on file  . Food insecurity - worry: Not on file  . Food insecurity - inability: Not on file  . Transportation needs - medical: Not on file  . Transportation needs - non-medical: Not on file  Occupational History  . Occupation: diabled    Comment: as of early 60s  Tobacco Use  . Smoking status: Current Every Day Smoker    Packs/day: 0.50    Years: 30.00    Pack years: 15.00    Types: Cigarettes    Start date: 08/05/1983  . Smokeless tobacco: Never Used  . Tobacco comment: started smoking 36month ago smoking 5-6 cigs a day as of 08/04/17  ep  Substance and  Sexual Activity  . Alcohol use: No    Alcohol/week: 0.0 oz  . Drug use: No  . Sexual activity: Not on file  Other Topics Concern  . Not on file  Social History Narrative  . Not on file    REVIEW OF SYSTEMS: Constitutional: Generally has been feeling poorly. ENT:  No nose bleeds.  Has very few teeth left but denies trouble chewing or other challenging substance Pulm: Denies S OB at rest.  Endorses dyspnea on exertion. CV:  No palpitations, no chest pain.  Chronic mild lower extremity edema. GU: Polyuria.  Some urinary incontinence. GI:  Per HPI.  No heartburn.  No dysphagia. Heme:   Per HPI   Transfusions: None Neuro: Chronic back pain.  Used to take Vicodin but has not been able to get providers to prescribe this for a few years.  Says that tramadol does not work as well. Derm:  No itching, no rash or sores.  Endocrine: Polyuria, polydipsia.  Sweats or chills. Immunization: Did not inquire as to recent patients. Travel:  None beyond local counties in last few months.    PHYSICAL EXAM: Vital signs in last 24 hours: Vitals:   08/24/17 1130 08/24/17 1215  BP: (!) 128/102   Pulse: 80 65  Resp:  19  Temp:    SpO2: 97% 98%   Wt Readings from Last 3 Encounters:  08/24/17 95.7 kg (211 lb)  08/04/17 97.8 kg (215 lb 9.6 oz)  03/17/17 94.5 kg (208 lb 6.4 oz)    General: Patient does not look ill.  Sitting comfortably on the stretcher Head: No facial asymmetry or swelling.  No signs of trauma. Eyes: No scleral icterus.  No conjunctival pallor Ears: Takes him a while to answer but I do not think it is because he cannot hear Nose: No discharge Mouth: Pink, clear, moist oral mucosa.  He has just a few teeth left tongue midline. Neck: No JVD.  No thyromegaly.  No masses Lungs: Clear bilaterally labored breathing.  No cough Heart: RRR.  No MRG.  S1, S2 present. Abdomen: Soft.  Not tender or distended.  Bowel sounds active.  Masses or HSM.  No hernias, no bruits..   Rectal: Did not repeat the rectal exam.  PA-C do this earlier stool described as dark and FOBT positive but not melenic. Musc/Skeltl: No gross joint deformity or swelling. Extremities: No CCE.  Good pedal pulses. Neurologic: Slow mentation but answers appropriately.  No tremor.  No gross limb weakness Skin: No telangiectasia or rashes. Tattoos: None observed. Nodes: No adenopathy. Psych: Affect flat, calm cooperative. LAB RESULTS: Recent Labs    08/24/17 0845  WBC 6.2  HGB 8.1*  HCT 27.6*  PLT 325   BMET Lab Results  Component Value Date   NA 130 (L) 08/24/2017   NA 139 12/25/2016   NA 139  12/24/2016   K 3.5 08/24/2017   K 3.3 (L) 12/25/2016   K 3.1 (L) 12/24/2016   CL 94 (L) 08/24/2017   CL 101 12/25/2016   CL 101 12/24/2016   CO2 23 08/24/2017   CO2 28 12/25/2016   CO2 29 12/24/2016   GLUCOSE 898 (HH) 08/24/2017   GLUCOSE 85 12/25/2016   GLUCOSE 96 12/24/2016   BUN 9 08/24/2017   BUN 9 12/25/2016   BUN 9 12/24/2016   CREATININE 1.35 (H) 08/24/2017   CREATININE 1.11 12/25/2016   CREATININE 1.13 12/24/2016   CALCIUM 9.5 08/24/2017   CALCIUM 9.5 12/25/2016   CALCIUM 9.4  12/24/2016   LFT Recent Labs    08/24/17 0935  PROT 7.2  ALBUMIN 3.2*  AST 18  ALT 15*  ALKPHOS 189*  BILITOT 0.4  BILIDIR <0.1*  IBILI NOT CALCULATED   PT/INR Lab Results  Component Value Date   INR 1.02 08/24/2017   INR 0.97 12/22/2016   INR 0.96 06/23/2013    RADIOLOGY STUDIES: Dg Chest 2 View  Result Date: 08/24/2017 CLINICAL DATA:  Hyperglycemia. EXAM: CHEST  2 VIEW COMPARISON:  Radiograph of Nov 12, 2016. FINDINGS: Stable cardiomegaly. No pneumothorax or pleural effusion is noted. No acute pulmonary disease is noted. Bony thorax is unremarkable. IMPRESSION: No active cardiopulmonary disease. Electronically Signed   By: Marijo Conception, M.D.   On: 08/24/2017 10:27   Ct Head Wo Contrast  Result Date: 08/24/2017 CLINICAL DATA:  Confusion and dizziness for 1 week, hypoglycemic, history CHF, coronary artery disease, type II diabetes mellitus, hypertension, smoker, COPD idiopathic cardiomyopathy EXAM: CT HEAD WITHOUT CONTRAST TECHNIQUE: Contiguous axial images were obtained from the base of the skull through the vertex without intravenous contrast. Sagittal and coronal MPR images reconstructed from axial data set. COMPARISON:  12/22/2016 CT head Correlation: MR brain 12/22/2016 FINDINGS: Brain: Normal ventricular morphology. Minimal atrophy. No midline shift or mass effect. Small vessel chronic ischemic changes of deep cerebral white matter. Otherwise normal appearance of brain  parenchyma. No intracranial hemorrhage, mass lesion, or evidence of acute infarction. No extra-axial fluid collections. Vascular: No hyperdense vessels Skull: Intact Sinuses/Orbits: Clear Other: N/A IMPRESSION: Minimal atrophy with small vessel chronic ischemic changes of deep cerebral white matter. No acute intracranial abnormalities. Electronically Signed   By: Lavonia Dana M.D.   On: 08/24/2017 10:31

## 2017-08-24 NOTE — ED Provider Notes (Signed)
[facility] EMERGENCY DEPARTMENT Provider Note   CSN: [id number] Arrival date & time: 08/24/17  0815     History   Chief Complaint Chief Complaint  Patient presents with  . Hyperglycemia    HPI James Zuniga is a 62 y.o. male.  HPI   James Zuniga is a 62 y.o. male, with a history of CHF, CVA,, presenting to the ED with complaint of "unsteadiness." Wife states patient has been unsteady on his feet and a little "foggy" for the last two weeks.  Wife took the patient to his PCP yesterday because she thought he had had another stroke.  PCP did lab work, found hyperglycemia and anemia, and advised patient to come to the ED.  Patient did not want to come to the ED last night, but patient's wife is able to convince him to come this morning. Also endorses polydipsia, polyuria, and urinary incontinence.  Patient has been noncompliant with his insulin due to financial concerns and has not been checking his blood sugar because "the meter is lost." Denies recent illness, fever/chills, N/V/D, cough, shortness of breath, chest pain, abdominal pain, hematochezia/melena, hematuria/dysuria, hemoptysis, hematemesis, peripheral edema, or any other complaints.  Patient has left-sided weakness at baseline due to a stroke summer 2018.  Past Medical History:  Diagnosis Date  . CHF (congestive heart failure) ([place])   . Chronic airway obstruction ([place])   . Chronic pain   . Coronary artery disease   . Diabetes mellitus   . Disorder of liver   . Esophageal reflux   . Gout   . Hyperlipidemia   . Hypertension   . Hypokalemia   . Hypokalemia 11/2016  . Hypotestosteronism 07/01/2013  . Insomnia   . Iron deficiency anemia, unspecified 07/01/2013  . Memory loss   . PVD (peripheral vascular disease) ([place])   . Renal disorder   . Sleep apnea   . Stroke ([place])   . Vitamin D deficiency     Patient Active Problem List   Diagnosis Date Noted  . Left-sided weakness   . Chronic pain of  left knee 12/22/2016  . Stroke ([place]) 12/22/2016  . Bradycardia 11/12/2016  . Generalized weakness 11/12/2016  . AKI (acute kidney injury) ([place]) 11/12/2016  . Hypophosphatemia 11/12/2016  . Leukocytosis 11/12/2016  . Diabetes mellitus with complication ([place]) 76/16/0737  . At risk for falling 09/05/2015  . Chronic respiratory failure ([place]) 07/22/2015  . COLD (chronic obstructive lung disease) ([place]) 11/12/2014  . Dyspnea and respiratory abnormality 11/12/2014  . Lung nodule 04/02/2014  . Smoking 04/02/2014  . Hypokalemia 12/15/2013  . Diabetes mellitus, type 2 ([place]) 09/04/2013  . Essential (primary) hypertension 09/04/2013  . Absolute anemia 09/01/2013  . Chronic pain 08/16/2013  . CAFL (chronic airflow limitation) ([place]) 08/16/2013  . Chronic obstructive pulmonary disease ([place]) 08/16/2013  . Adiposity 08/11/2013  . Peripheral blood vessel disorder ([place]) 07/03/2013  . Cannot sleep 07/03/2013  . Peripheral vascular disease ([place]) 07/03/2013  . Iron deficiency anemia 07/01/2013  . Hypotestosteronism 07/01/2013  . 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 Compulsive tobacco user syndrome 06/01/2013  . Current tobacco use 06/01/2013  . Pulmonary nodule, right 36mm Aug 2014 03/25/2013  . Liver lesion 03/25/2013  . Abnormal magnetic resonance imaging study 03/21/2013  . Disease of liver 03/15/2013  . 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 Cancer screening 10/18/2012  . COPD (chronic obstructive pulmonary disease) ([place]) 08/11/2012  . Adverse reaction to bacterial vaccine 06/17/2012  . Sacroiliac joint disease  02/04/2012  . Body mass index 27.0-27.9, adult 01/04/2012  . Emphysema 11/23/2011  . Chronic gout 11/23/2011  . Congestive heart failure ([place]) 11/23/2011  . Hypertension 11/23/2011  . Hyperlipidemia 11/23/2011  . Chronic bronchitis 11/23/2011  . Type 2 diabetes mellitus ([place]) 11/23/2011  . Chronic back pain 11/23/2011  . Osteoarthritis of left knee 11/23/2011    . Avitaminosis D 11/05/2011  . Gout 11/05/2011  . Cardiomyopathy, idiopathic ([place]) 11/04/2011  . Nondependent alcohol abuse 11/04/2011  . Amnesia 11/04/2011  . Acid reflux 11/04/2011  . Arthropathia 11/04/2011  . Primary cardiomyopathy ([place]) 11/04/2011    Past Surgical History:  Procedure Laterality Date  . KNEE ARTHROSCOPY    . NO PAST SURGERIES    . TEE WITHOUT CARDIOVERSION N/A 12/24/2016   Procedure: TRANSESOPHAGEAL ECHOCARDIOGRAM (TEE);  Surgeon: Fay Records, MD;  Location: [facility] ENDOSCOPY;  Service: Cardiovascular;  Laterality: N/A;       Home Medications    Prior to Admission medications   Medication Sig Start Date End Date Taking? Authorizing Provider  albuterol (PROVENTIL) (2.5 MG/3ML) 0.083% nebulizer solution Take 2.5 mg by nebulization every 6 (six) hours as needed for wheezing or shortness of breath. 07/27/16   [provider]  allopurinol (ZYLOPRIM) 300 MG tablet Take 300 mg by mouth daily.    [provider]  amLODipine (NORVASC) 5 MG tablet  07/14/17   [provider]  aspirin 81 MG EC tablet Take 81 mg by mouth daily. Swallow whole.    [provider]  atorvastatin (LIPITOR) 80 MG tablet Take 1 tablet (80 mg total) by mouth every morning. 12/25/16   Alphonzo Grieve, MD  clopidogrel (PLAVIX) 75 MG tablet Take 1 tablet (75 mg total) by mouth daily. 12/26/16   Alphonzo Grieve, MD  DULoxetine (CYMBALTA) 60 MG capsule Take 60 mg by mouth daily.    [provider]  fluticasone (FLOVENT HFA) 220 MCG/ACT inhaler Inhale 1 puff into the lungs 2 (two) times daily. 08/04/17   Brand Males, MD  furosemide (LASIX) 20 MG tablet Take 20 mg by mouth 2 (two) times a week.  03/21/13   [provider]  gabapentin (NEURONTIN) 300 MG capsule Take 300 mg by mouth 3 (three) times daily.  10/18/14   [provider]  ipratropium-albuterol (DUONEB) 0.5-2.5 (3) MG/3ML SOLN Take 3 mLs by nebulization every 6 (six) hours as needed. DX:  J44.9 03/25/17   Donita Brooks, NP  losartan (COZAAR) 100 MG tablet Take 100 mg by mouth daily.  10/15/16   [provider]  metoprolol succinate (TOPROL-XL) 25 MG 24 hr tablet Take 1 tablet (25 mg total) by mouth daily. 12/26/16   Alphonzo Grieve, MD  omeprazole (PRILOSEC) 20 MG capsule Take 20 mg by mouth daily.  07/19/14   [provider]  OXYGEN Inhale 2 L into the lungs as needed.    [provider]  potassium chloride SA (K-DUR,KLOR-CON) 20 MEQ tablet Take 1 tablet (20 mEq total) by mouth 2 (two) times daily. 05/03/15   Volanda Napoleon, MD  primidone (MYSOLINE) 50 MG tablet Take 50 mg by mouth daily.  06/30/16 06/30/17  [provider]  primidone (MYSOLINE) 50 MG tablet Take by mouth. 01/26/17   [provider]  tamsulosin (FLOMAX) 0.4 MG CAPS capsule Take 1 capsule (0.4 mg total) by mouth daily after breakfast. 12/06/15   Volanda Napoleon, MD  tiZANidine (ZANAFLEX) 4 MG tablet 4 mg 3 (three) times daily as needed for muscle spasms.  10/04/14   [provider]  traMADol (ULTRAM) 50 MG tablet Take 50 mg by mouth daily as needed for moderate pain.  09/08/13   [provider]  zolpidem (AMBIEN) 10 MG tablet Take 10 mg by mouth at bedtime.  09/02/15   [provider]    Family History Family History  Problem Relation Age of Onset  . Heart disease Mother   . Diabetes Father   . Stroke Brother   . Stroke Paternal Uncle     Social History Social History   Tobacco Use  . Smoking status: Current Every Day Smoker    Packs/day: 0.50    Years: 30.00    Pack years: 15.00    Types: Cigarettes    Start date: 08/05/1983  . Smokeless tobacco: Never Used  . Tobacco comment: started smoking 11months ago smoking 5-6 cigs a day as of 08/04/17  ep  Substance Use Topics  . Alcohol use: No    Alcohol/week: 0.0 oz  . Drug use: No     Allergies   Patient has no known allergies.   Review of Systems Review of Systems    Constitutional: Negative for chills, diaphoresis and fever.  Respiratory: Negative for shortness of breath.   Cardiovascular: Negative for chest pain and leg swelling.  Gastrointestinal: Negative for abdominal pain, blood in stool, diarrhea, nausea and vomiting.  Endocrine: Positive for polydipsia and polyuria.  Neurological: Positive for weakness. Negative for dizziness, syncope, facial asymmetry, numbness and headaches.  Psychiatric/Behavioral: Positive for confusion.  All other systems reviewed and are negative.    Physical Exam Updated Vital Signs BP (!) 141/91 (BP Location: Right Arm)   Pulse 89   Temp 98 F (36.7 C) (Oral)   Resp 18   Ht 5\' 6"  (1.676 m)   Wt 95.7 kg (211 lb)   SpO2 95%   BMI 34.06 kg/m   Physical Exam  Constitutional: He is oriented to person, place, and time. He appears well-developed and well-nourished. No distress.  HENT:  Head: Normocephalic and atraumatic.  Mouth/Throat: Mucous membranes are dry.  Eyes: Conjunctivae and EOM are normal. Pupils are equal, round, and reactive to light.  Neck: Neck supple.  Cardiovascular: Normal rate, regular rhythm, normal heart sounds and intact distal pulses.  Pulmonary/Chest: Effort normal and breath sounds normal. No respiratory distress.  Abdominal: Soft. There is no tenderness. There is no guarding.  Genitourinary: Rectal exam shows guaiac positive stool.  Genitourinary Comments: Stool appears to be dark, however, does not appear to be consistent with frank melena.  No hematochezia noted.  No external hemorrhoids, fissures, or lesions noted. No stool burden. No rectal tenderness.   Musculoskeletal: He exhibits no edema.  Lymphadenopathy:    He has no cervical adenopathy.  Neurological: He is alert and oriented to person, place, and time.  No sensory deficits.  No noted speech deficits. No aphasia. Patient handles oral secretions without difficulty. No noted swallowing defects.  Equal grip strength  bilaterally. Strength 5/5 in the upper extremities, however, somewhat weaker on the left arm with extension of the arm, which patient states is due to his previous stroke. Strength 5/5 with flexion and extension of the hips, knees, and ankles bilaterally.  Negative Romberg.  Patient is able to ambulate using only his cane for assistance, which is his baseline. Coordination intact including heel to shin and finger to nose.  Cranial nerves III-XII grossly intact.  No facial droop.   Skin: Skin is warm and dry. Capillary refill  takes less than 2 seconds. He is not diaphoretic.  Psychiatric: He has a normal mood and affect. His behavior is normal.  Nursing note and vitals reviewed.    ED Treatments / Results  Labs (all labs ordered are listed, but only abnormal results are displayed) Labs Reviewed  BASIC METABOLIC PANEL - Abnormal; Notable for the following components:      Result Value   Sodium 130 (*)    Chloride 94 (*)    Glucose, Bld 898 (*)    Creatinine, Ser 1.35 (*)    GFR calc non Af Amer 55 (*)    All other components within normal limits  CBC - Abnormal; Notable for the following components:   RBC 3.40 (*)    Hemoglobin 8.1 (*)    HCT 27.6 (*)    MCH 23.8 (*)    MCHC 29.3 (*)    RDW 15.9 (*)    All other components within normal limits  URINALYSIS, ROUTINE W REFLEX MICROSCOPIC - Abnormal; Notable for the following components:   Color, Urine COLORLESS (*)    Glucose, UA >=500 (*)    All other components within normal limits  HEPATIC FUNCTION PANEL - Abnormal; Notable for the following components:   Albumin 3.2 (*)    ALT 15 (*)    Alkaline Phosphatase 189 (*)    Bilirubin, Direct <0.1 (*)    All other components within normal limits  CBG MONITORING, ED - Abnormal; Notable for the following components:   Glucose-Capillary >600 (*)    All other components within normal limits  POC OCCULT BLOOD, ED - Abnormal; Notable for the following components:   Fecal Occult Bld  POSITIVE (*)    All other components within normal limits  I-STAT VENOUS BLOOD GAS, ED - Abnormal; Notable for the following components:   pH, Ven 7.442 (*)    pCO2, Ven 42.6 (*)    pO2, Ven 55.0 (*)    Bicarbonate 29.1 (*)    Acid-Base Excess 5.0 (*)    All other components within normal limits  APTT  PROTIME-INR  BLOOD GAS, VENOUS  OSMOLALITY  CBG MONITORING, ED  I-STAT TROPONIN, ED  TYPE AND SCREEN  ABO/RH   Hemoglobin  Date Value Ref Range Status  08/24/2017 8.1 (L) 13.0 - 17.0 g/dL Final  12/23/2016 14.3 13.0 - 17.0 g/dL Final  12/22/2016 16.0 13.0 - 17.0 g/dL Final  12/22/2016 14.7 13.0 - 17.0 g/dL Final   HGB  Date Value Ref Range Status  12/18/2016 15.2 13.0 - 17.1 g/dL Final  09/25/2016 15.4 13.0 - 17.1 g/dL Final  07/31/2016 16.2 13.0 - 17.1 g/dL Final  06/19/2016 17.4 (H) 13.0 - 17.1 g/dL Final       EKG  EKG Interpretation None       Radiology Dg Chest 2 View  Result Date: 08/24/2017 CLINICAL DATA:  Hyperglycemia. EXAM: CHEST  2 VIEW COMPARISON:  Radiograph of Nov 12, 2016. FINDINGS: Stable cardiomegaly. No pneumothorax or pleural effusion is noted. No acute pulmonary disease is noted. Bony thorax is unremarkable. IMPRESSION: No active cardiopulmonary disease. Electronically Signed   By: Marijo Conception, M.D.   On: 08/24/2017 10:27   Ct Head Wo Contrast  Result Date: 08/24/2017 CLINICAL DATA:  Confusion and dizziness for 1 week, hypoglycemic, history CHF, coronary artery disease, type II diabetes mellitus, hypertension, smoker, COPD idiopathic cardiomyopathy EXAM: CT HEAD WITHOUT CONTRAST TECHNIQUE: Contiguous axial images were obtained from the base of the skull through the vertex without intravenous  contrast. Sagittal and coronal MPR images reconstructed from axial data set. COMPARISON:  12/22/2016 CT head Correlation: MR brain 12/22/2016 FINDINGS: Brain: Normal ventricular morphology. Minimal atrophy. No midline shift or mass effect. Small vessel  chronic ischemic changes of deep cerebral white matter. Otherwise normal appearance of brain parenchyma. No intracranial hemorrhage, mass lesion, or evidence of acute infarction. No extra-axial fluid collections. Vascular: No hyperdense vessels Skull: Intact Sinuses/Orbits: Clear Other: N/A IMPRESSION: Minimal atrophy with small vessel chronic ischemic changes of deep cerebral white matter. No acute intracranial abnormalities. Electronically Signed   By: Lavonia Dana M.D.   On: 08/24/2017 10:31    Procedures .Critical Care Performed by: Lorayne Bender, PA-C Authorized by: Lorayne Bender, PA-C   Critical care provider statement:    Critical care time (minutes):  35   Critical care was necessary to treat or prevent imminent or life-threatening deterioration of the following conditions:  Metabolic crisis   Critical care was time spent personally by me on the following activities:  Development of treatment plan with patient or surrogate, discussions with consultants, evaluation of patient's response to treatment, examination of patient, obtaining history from patient or surrogate, review of old charts, re-evaluation of patient's condition, pulse oximetry, ordering and review of radiographic studies, ordering and review of laboratory studies and ordering and performing treatments and interventions   I assumed direction of critical care for this patient from another provider in my specialty: no       (including critical care time)  Medications Ordered in ED Medications  dextrose 5 %-0.45 % sodium chloride infusion ( Intravenous Hold 08/24/17 1001)  insulin regular (NOVOLIN R,HUMULIN R) 100 Units in sodium chloride 0.9 % 100 mL (1 Units/mL) infusion (5.4 Units/hr Intravenous New Bag/Given 08/24/17 1033)  sodium chloride 0.9 % bolus 1,000 mL (1,000 mLs Intravenous New Bag/Given 08/24/17 1029)    And  0.9 %  sodium chloride infusion ( Intravenous New Bag/Given 08/24/17 1034)     Initial Impression /  Assessment and Plan / ED Course  I have reviewed the triage vital signs and the nursing notes.  Pertinent labs & imaging results that were available during my care of the patient were reviewed by me and considered in my medical decision making (see chart for details).  Clinical Course as of Aug 24 1112  Tue Aug 24, 2017  1023 Spoke with Azucena Freed, PA-C with [facility]. States she will come see the patient.  [SJ]  1112 Spoke with Dr. Reesa Chew, IM resident. States they will come see and admit the patient.   [SJ]    Clinical Course User Index [SJ] Imer Foxworth C, PA-C   Patient presents with hyperglycemia without acidosis or increased anion gap.  Suspect possible HHS. Patient also noted to be anemic at 8.1 when compared to the previous value of 14.3 eight months ago.  No GI complaints.  Abdominal exam benign.  Hemodynamically stable.  Gastroenterology to consult on the patient. Admission for continued workup and management.  Findings and plan of care discussed with Addison Lank, MD. Dr. Leonette Monarch personally evaluated and examined this patient.  Vitals:   08/24/17 0914 08/24/17 0919 08/24/17 0930 08/24/17 1000  BP:   121/85 113/75  Pulse: 79 80 80 68  Resp:  14 (!) 24 (!) 24  Temp:      TempSrc:      SpO2: 98% 97% 97% 96%  Weight:      Height:       Vitals:   08/24/17  0919 08/24/17 0930 08/24/17 1000 08/24/17 1030  BP:  121/85 113/75 106/77  Pulse: 80 80 68 75  Resp: 14 (!) 24 (!) 24 16  Temp:      TempSrc:      SpO2: 97% 97% 96% 99%  Weight:      Height:         Final Clinical Impressions(s) / ED Diagnoses   Final diagnoses:  Hyperglycemia  Anemia, unspecified type    ED Discharge Orders    None       Lorayne Bender, PA-C 08/24/17 1117

## 2017-08-24 NOTE — H&P (Signed)
Date: 08/24/2017               Patient Name:  James Zuniga MRN: [id number]  DOB: Sep 15, 1955 Age / Sex: 62 y.o., male   PCP: Berkley Harvey, NP         Medical Service: Internal Medicine Teaching Service         Attending Physician: Dr. Oval Linsey, MD    First Contact: Dr. Trilby Drummer Pager: [phone]  Second Contact: Dr. Reesa Chew Pager: [phone]       After Hours (After 5p/  First Contact Pager: [phone]  weekends / holidays): Second Contact Pager: [phone]   Chief Complaint: Confusion, Fatigue  History of Present Illness: Mr. Lunden is a 62 yo M with a history of CVA (2018), CHF, and Diabetes who presents with confusion and fatigue who was found to have elevated blood glucose and low hemoglobin at his PCP visit 2/11. Patient reports 3-4 weeks of forgetfulness, confusion, sluggishness, weakness, increased urination and increased thirst. He was seen by his PCP yesterday after his family noticed his symptoms in the last 2 weeks. The PCP instructed the patient to go to the ED yesterday but he refused at that time and was brought by the family to the ED today. Patient states he has not been able to afford his insulin and has not taken his insulin or metformin since October 2018. Patient endorses some recent lightheadedness, dizziness, blurry vision, and some blood in his stool and melena. He denies fevers, chills, nausea, constipation, diarrhea, dyspnea, or chest pain.    In the ED, patient was hemodynamically stable, with occassionally increased RR to the low 20s. CBC showed Hgb 8.1 (baseline 14.3); BMP showed Na 130 (corrects to 143), Cr 1.35 (baseline 1.11), Glucose 898 (normal CO2-, no gap); U/A showed Glu>500, no ketones; LFT showed mildly elevated ALP; PT-INR/PTT WNL; Urine OSM 319; Troponin negative and Fecal Occult positive. EKG showed sinus rhythm with wandering baseline, left axis deviation,  and low voltage. CXR showed no acute cardiopulmonary disease. CT Head showed no acute intracranial  disease. Patient was started on insulin gtt and D5W-1/2NS infusion. GI was consulted in the ED for suspected GI bleed. Patient to be admitted for further workup and care.     Meds:  No current facility-administered medications on file prior to encounter.    Current Outpatient Medications on File Prior to Encounter  Medication Sig  . albuterol (PROVENTIL) (2.5 MG/3ML) 0.083% nebulizer solution Take 2.5 mg by nebulization every 6 (six) hours as needed for wheezing or shortness of breath.  . allopurinol (ZYLOPRIM) 300 MG tablet Take 300 mg by mouth daily.  Marland Kitchen amLODipine (NORVASC) 5 MG tablet Take 5 mg by mouth daily.   Marland Kitchen aspirin 81 MG EC tablet Take 81 mg by mouth daily. Swallow whole.  Marland Kitchen atorvastatin (LIPITOR) 80 MG tablet Take 1 tablet (80 mg total) by mouth every morning.  . clopidogrel (PLAVIX) 75 MG tablet Take 1 tablet (75 mg total) by mouth daily.  . DULoxetine (CYMBALTA) 60 MG capsule Take 60 mg by mouth daily.  . furosemide (LASIX) 20 MG tablet Take 20 mg by mouth 2 (two) times a week.   . gabapentin (NEURONTIN) 300 MG capsule Take 300 mg by mouth 4 (four) times daily.   Marland Kitchen ipratropium-albuterol (DUONEB) 0.5-2.5 (3) MG/3ML SOLN Take 3 mLs by nebulization every 6 (six) hours as needed. DX: J44.9  . losartan (COZAAR) 100 MG tablet Take 100 mg by mouth daily.   Marland Kitchen  metoprolol succinate (TOPROL-XL) 25 MG 24 hr tablet Take 1 tablet (25 mg total) by mouth daily. (Patient taking differently: Take 25 mg by mouth 2 (two) times daily. )  . omeprazole (PRILOSEC) 20 MG capsule Take 20 mg by mouth daily.   . OXYGEN Inhale 2 L into the lungs as needed.  . potassium chloride SA (K-DUR,KLOR-CON) 20 MEQ tablet Take 1 tablet (20 mEq total) by mouth 2 (two) times daily.  . primidone (MYSOLINE) 50 MG tablet Take 100 mg by mouth at bedtime.   Marland Kitchen tiZANidine (ZANAFLEX) 4 MG tablet 4 mg 3 (three) times daily as needed for muscle spasms.   . traMADol (ULTRAM) 50 MG tablet Take 50 mg by mouth daily as needed for  moderate pain.   Marland Kitchen zolpidem (AMBIEN) 10 MG tablet Take 10 mg by mouth at bedtime.   . fluticasone (FLOVENT HFA) 220 MCG/ACT inhaler Inhale 1 puff into the lungs 2 (two) times daily. (Patient not taking: Reported on 08/24/2017)  . tamsulosin (FLOMAX) 0.4 MG CAPS capsule Take 1 capsule (0.4 mg total) by mouth daily after breakfast. (Patient not taking: Reported on 08/24/2017)    Allergies: Allergies as of 08/24/2017  . (No Known Allergies)   Past Medical History:  Diagnosis Date  . CHF (congestive heart failure) ([place])   . Chronic airway obstruction ([place])   . Chronic pain   . Coronary artery disease   . Diabetes mellitus   . Disorder of liver   . Esophageal reflux   . Gout   . Hyperlipidemia   . Hypertension   . Hypokalemia 11/2016  . Hypotestosteronism 07/01/2013  . Insomnia   . Iron deficiency anemia, unspecified 07/01/2013  . Memory loss   . PVD (peripheral vascular disease) ([place])   . Renal disorder   . Sleep apnea   . Stroke ([place])   . Vitamin D deficiency     Family History: Family History  Problem Relation Age of Onset  . Heart disease Mother   . Diabetes Father   . Stroke Brother   . Stroke Paternal Uncle   . Diabetes Sister   . Heart attack Brother   . Heart attack Brother   - Confirmed on Admission  Social History: Social History   Tobacco Use  . Smoking status: Current Every Day Smoker    Packs/day: 0.50    Years: 30.00    Pack years: 15.00    Types: Cigarettes    Start date: 08/05/1983  . Smokeless tobacco: Never Used  . Tobacco comment: started smoking 28months ago smoking 5-6 cigs a day as of 08/04/17  ep  Substance Use Topics  . Alcohol use: No    Alcohol/week: 0.0 oz  . Drug use: No  - Confirmed on admission, Patient states he has not had a cigarette in 10 days  Review of Systems: A complete ROS was negative except as per HPI.  Physical Exam: Blood pressure (!) 117/95, pulse 76, temperature 98 F (36.7 C), temperature source Oral, resp. rate  12, height 5\' 6"  (1.676 m), weight 211 lb (95.7 kg), SpO2 94 %. Physical Exam  Constitutional: He appears well-developed and well-nourished.  HENT:  Head: Normocephalic and atraumatic.  Eyes: EOM are normal. Right eye exhibits no discharge. Left eye exhibits no discharge.  Cardiovascular: Normal rate, regular rhythm, normal heart sounds and intact distal pulses.  Pulmonary/Chest: Effort normal and breath sounds normal. No respiratory distress.  Abdominal: Soft. Bowel sounds are normal. He exhibits no distension. There is no tenderness.  Musculoskeletal: He exhibits no deformity.  Trace bilateral LE pitting edema  Neurological: He is alert.  Skin: Skin is warm and dry.    EKG: personally reviewed my interpretation is sinus rhythm with wandering baseline, left axis deviation,  and low voltage.   CXR: personally reviewed and I agree with:  - acute cardiopulmonary disease  Assessment & Plan by Problem: Mr. Bolz is a 62 yo M with a history of CVA (2018), CHF, and Diabetes who presents with confusion and fatigue who was found to have elevated blood glucose and low hemoglobin at his PCP visit 2/11. With Glucose 898 and Hgb 8.1 in ED.  HHS: Patient presented with Glucose 898, without acidosis, elevated anion gap, nor ketonuria. He was started on Insulin gtt in ED. Upon evaluation BG trending down to 519. - Admit to stepdown - Continue insulin drip - Repeat BMP today, replete K as needed - Continue D5-1/2 NS - Once CBG < 300 and patient able to eat, administer Lantus 15 units - Continue insulin drip for 2hrs After Lantus dose, then discontinue and start SSI  - Discontinue fluids if eating, drinking, and off insulin drip - CBG Q1H  GI Bleed: Patient presented with Hgb 8.1 down from baseline of 14.3. GI consulted in ED. Expect AM Hgb to be dilute considering significant volume repletion. - Appreciate GI recommendations - Will hold ASA and Plavix  DM: Patient has been on metformin and  insulin in the past, but state he has stopped taking both of these including Insulin in October (due to financial restriction). - Lantus 15U qhs starting 2/13 - SSI  CHF: Last Echo 12/25/16: EF 40/-45%, Diffuse hypokinesis, PeakPAP 103mmHg. - Continue home metoprolol 25mg  Daily - Holding home lasix in the setting of soft-normal BP with increased urination 2/2 HHS  HTN: - Continue home metoprolol 25mg  Daily - Holding home lasix in the setting of soft-normal BP with increased urination 2/2 HHS  CVA: In 2018. - Holding home ASA 81 and Plavix - Continue home atorvastatin  COPD - Continue albuterol neb, q6h PRN - Continue Duoneb, q6h PRN - Continue home fluticasone, 1 puff BID  Chronic Back Pain: Takes Tramadol at home. - Continue home tramadol 50mg  Daily, PRN Pain  FEN: NPO (sips with meds), Diet CM when BS <300 and pending GI recs DVT Prophylaxis: SCDs, No ChemoPPX in setting of high risk for GI Bleed Code Status: Full  Dispo: Admit patient to Observation with expected length of stay less than 2 midnights.  Signed: Neva Seat, MD 08/24/2017, 3:09 PM  Pager: [phone]

## 2017-08-25 DIAGNOSIS — Z91120 Patient's intentional underdosing of medication regimen due to financial hardship: Secondary | ICD-10-CM | POA: Diagnosis not present

## 2017-08-25 DIAGNOSIS — Z8673 Personal history of transient ischemic attack (TIA), and cerebral infarction without residual deficits: Secondary | ICD-10-CM

## 2017-08-25 DIAGNOSIS — T383X6A Underdosing of insulin and oral hypoglycemic [antidiabetic] drugs, initial encounter: Secondary | ICD-10-CM | POA: Diagnosis not present

## 2017-08-25 DIAGNOSIS — K922 Gastrointestinal hemorrhage, unspecified: Secondary | ICD-10-CM

## 2017-08-25 DIAGNOSIS — E1100 Type 2 diabetes mellitus with hyperosmolarity without nonketotic hyperglycemic-hyperosmolar coma (NKHHC): Secondary | ICD-10-CM

## 2017-08-25 DIAGNOSIS — Z7901 Long term (current) use of anticoagulants: Secondary | ICD-10-CM | POA: Diagnosis not present

## 2017-08-25 DIAGNOSIS — D500 Iron deficiency anemia secondary to blood loss (chronic): Secondary | ICD-10-CM | POA: Diagnosis present

## 2017-08-25 DIAGNOSIS — K317 Polyp of stomach and duodenum: Secondary | ICD-10-CM | POA: Diagnosis present

## 2017-08-25 DIAGNOSIS — Z7902 Long term (current) use of antithrombotics/antiplatelets: Secondary | ICD-10-CM

## 2017-08-25 DIAGNOSIS — M549 Dorsalgia, unspecified: Secondary | ICD-10-CM

## 2017-08-25 DIAGNOSIS — I509 Heart failure, unspecified: Secondary | ICD-10-CM

## 2017-08-25 DIAGNOSIS — I2510 Atherosclerotic heart disease of native coronary artery without angina pectoris: Secondary | ICD-10-CM | POA: Diagnosis present

## 2017-08-25 DIAGNOSIS — E785 Hyperlipidemia, unspecified: Secondary | ICD-10-CM | POA: Diagnosis present

## 2017-08-25 DIAGNOSIS — Z79899 Other long term (current) drug therapy: Secondary | ICD-10-CM | POA: Diagnosis not present

## 2017-08-25 DIAGNOSIS — Z79891 Long term (current) use of opiate analgesic: Secondary | ICD-10-CM

## 2017-08-25 DIAGNOSIS — J449 Chronic obstructive pulmonary disease, unspecified: Secondary | ICD-10-CM | POA: Diagnosis present

## 2017-08-25 DIAGNOSIS — M109 Gout, unspecified: Secondary | ICD-10-CM | POA: Diagnosis present

## 2017-08-25 DIAGNOSIS — Z794 Long term (current) use of insulin: Secondary | ICD-10-CM

## 2017-08-25 DIAGNOSIS — I4891 Unspecified atrial fibrillation: Secondary | ICD-10-CM | POA: Diagnosis present

## 2017-08-25 DIAGNOSIS — G4733 Obstructive sleep apnea (adult) (pediatric): Secondary | ICD-10-CM | POA: Diagnosis present

## 2017-08-25 DIAGNOSIS — K921 Melena: Secondary | ICD-10-CM | POA: Diagnosis present

## 2017-08-25 DIAGNOSIS — F17210 Nicotine dependence, cigarettes, uncomplicated: Secondary | ICD-10-CM | POA: Diagnosis present

## 2017-08-25 DIAGNOSIS — I110 Hypertensive heart disease with heart failure: Secondary | ICD-10-CM

## 2017-08-25 DIAGNOSIS — I5022 Chronic systolic (congestive) heart failure: Secondary | ICD-10-CM | POA: Diagnosis present

## 2017-08-25 DIAGNOSIS — R739 Hyperglycemia, unspecified: Secondary | ICD-10-CM | POA: Diagnosis present

## 2017-08-25 DIAGNOSIS — R195 Other fecal abnormalities: Secondary | ICD-10-CM | POA: Diagnosis not present

## 2017-08-25 DIAGNOSIS — G8929 Other chronic pain: Secondary | ICD-10-CM

## 2017-08-25 DIAGNOSIS — D509 Iron deficiency anemia, unspecified: Secondary | ICD-10-CM | POA: Diagnosis not present

## 2017-08-25 DIAGNOSIS — E876 Hypokalemia: Secondary | ICD-10-CM | POA: Diagnosis not present

## 2017-08-25 DIAGNOSIS — E1151 Type 2 diabetes mellitus with diabetic peripheral angiopathy without gangrene: Secondary | ICD-10-CM | POA: Diagnosis present

## 2017-08-25 DIAGNOSIS — Z7982 Long term (current) use of aspirin: Secondary | ICD-10-CM | POA: Diagnosis not present

## 2017-08-25 DIAGNOSIS — G4700 Insomnia, unspecified: Secondary | ICD-10-CM | POA: Diagnosis present

## 2017-08-25 DIAGNOSIS — K219 Gastro-esophageal reflux disease without esophagitis: Secondary | ICD-10-CM | POA: Diagnosis present

## 2017-08-25 LAB — GLUCOSE, CAPILLARY
GLUCOSE-CAPILLARY: 332 mg/dL — AB (ref 65–99)
GLUCOSE-CAPILLARY: 536 mg/dL — AB (ref 65–99)
GLUCOSE-CAPILLARY: 402 mg/dL — AB (ref 65–99)
Glucose-Capillary: 238 mg/dL — ABNORMAL HIGH (ref 65–99)
Glucose-Capillary: 251 mg/dL — ABNORMAL HIGH (ref 65–99)

## 2017-08-25 LAB — BASIC METABOLIC PANEL
Anion gap: 10 (ref 5–15)
BUN: 8 mg/dL (ref 6–20)
CHLORIDE: 105 mmol/L (ref 101–111)
CO2: 24 mmol/L (ref 22–32)
CREATININE: 1.08 mg/dL (ref 0.61–1.24)
Calcium: 8.9 mg/dL (ref 8.9–10.3)
GFR calc Af Amer: >60 mL/min (ref >60)
GFR calc non Af Amer: >60 mL/min (ref >60)
Glucose, Bld: 182 mg/dL — ABNORMAL HIGH (ref 65–99)
POTASSIUM: 2.9 mmol/L — AB (ref 3.5–5.1)
Sodium: 139 mmol/L (ref 135–145)

## 2017-08-25 LAB — CBC
HCT: 25.3 % — ABNORMAL LOW (ref 39.0–52.0)
Hemoglobin: 7.5 g/dL — ABNORMAL LOW (ref 13.0–17.0)
MCH: 23.3 pg — AB (ref 26.0–34.0)
MCHC: 29.6 g/dL — ABNORMAL LOW (ref 30.0–36.0)
MCV: 78.6 fL (ref 78.0–100.0)
PLATELETS: 290 10*3/uL (ref 150–400)
RBC: 3.22 MIL/uL — ABNORMAL LOW (ref 4.22–5.81)
RDW: 15.2 % (ref 11.5–15.5)
WBC: 7.5 10*3/uL (ref 4.0–10.5)

## 2017-08-25 LAB — POTASSIUM: Potassium: 4.1 mmol/L (ref 3.5–5.1)

## 2017-08-25 MED ORDER — SODIUM CHLORIDE 0.9 % IV SOLN
INTRAVENOUS | Status: DC
  Administered 2017-08-25: 18:00:00 via INTRAVENOUS

## 2017-08-25 MED ORDER — FERROUS SULFATE 325 (65 FE) MG PO TABS
325.0000 mg | ORAL_TABLET | Freq: Three times a day (TID) | ORAL | Status: DC
  Administered 2017-08-25 – 2017-08-26 (×3): 325 mg via ORAL
  Filled 2017-08-25 (×3): qty 1

## 2017-08-25 MED ORDER — INSULIN GLARGINE 100 UNIT/ML ~~LOC~~ SOLN
25.0000 [IU] | Freq: Every day | SUBCUTANEOUS | Status: DC
  Administered 2017-08-25: 25 [IU] via SUBCUTANEOUS
  Filled 2017-08-25 (×3): qty 0.25

## 2017-08-25 MED ORDER — PEG-KCL-NACL-NASULF-NA ASC-C 100 G PO SOLR: 1.0000 | Freq: Once | ORAL | Status: DC

## 2017-08-25 MED ORDER — INSULIN STARTER KIT- SYRINGES (ENGLISH)
1.0000 | Freq: Once | Status: AC
  Administered 2017-08-25: 1
  Filled 2017-08-25: qty 1

## 2017-08-25 MED ORDER — METOCLOPRAMIDE HCL 5 MG/ML IJ SOLN
10.0000 mg | Freq: Once | INTRAMUSCULAR | Status: AC
  Administered 2017-08-26: 10 mg via INTRAVENOUS
  Filled 2017-08-25: qty 2

## 2017-08-25 MED ORDER — LIVING WELL WITH DIABETES BOOK
Freq: Once | Status: AC
  Administered 2017-08-25: 11:00:00
  Filled 2017-08-25: qty 1

## 2017-08-25 MED ORDER — BISACODYL 5 MG PO TBEC
5.0000 mg | DELAYED_RELEASE_TABLET | Freq: Three times a day (TID) | ORAL | Status: AC
  Administered 2017-08-25 (×2): 5 mg via ORAL
  Filled 2017-08-25 (×2): qty 1

## 2017-08-25 MED ORDER — BISACODYL 5 MG PO TBEC: 5.0000 mg | DELAYED_RELEASE_TABLET | Freq: Once | ORAL | Status: DC

## 2017-08-25 MED ORDER — POTASSIUM CHLORIDE CRYS ER 20 MEQ PO TBCR
40.0000 meq | EXTENDED_RELEASE_TABLET | ORAL | Status: AC
  Administered 2017-08-25 (×3): 40 meq via ORAL
  Filled 2017-08-25 (×3): qty 2

## 2017-08-25 MED ORDER — INSULIN ASPART 100 UNIT/ML ~~LOC~~ SOLN
10.0000 [IU] | Freq: Once | SUBCUTANEOUS | Status: AC
  Administered 2017-08-25: 10 [IU] via SUBCUTANEOUS

## 2017-08-25 MED ORDER — PEG-KCL-NACL-NASULF-NA ASC-C 100 G PO SOLR
0.5000 | Freq: Once | ORAL | Status: AC
  Administered 2017-08-25: 100 g via ORAL
  Filled 2017-08-25: qty 1

## 2017-08-25 MED ORDER — METOCLOPRAMIDE HCL 5 MG/ML IJ SOLN
10.0000 mg | Freq: Once | INTRAMUSCULAR | Status: AC
  Administered 2017-08-25: 10 mg via INTRAVENOUS
  Filled 2017-08-25: qty 2

## 2017-08-25 MED ORDER — PEG-KCL-NACL-NASULF-NA ASC-C 100 G PO SOLR
0.5000 | Freq: Once | ORAL | Status: AC
  Administered 2017-08-26: 100 g via ORAL

## 2017-08-25 MED ORDER — INSULIN GLARGINE 100 UNIT/ML ~~LOC~~ SOLN
20.0000 [IU] | Freq: Every day | SUBCUTANEOUS | Status: DC
  Filled 2017-08-25: qty 0.2

## 2017-08-25 NOTE — Progress Notes (Signed)
          Daily Rounding Note  08/25/2017, 8:20 AM  LOS: 0 days    ASSESMENT:   *  Anemia, declining MCV and Hgb c/w 8 months ago.  FOBT + dark stool. Low dose Omeprazole at home.  Hgb relatively stable considering how much IVF he's received.    Screening colonoscopy unremarkable 2014.    *  Chronic ASA/Plavix for CVA, ? Afib.  Last dose  2/12 AM.   Cardiologist is Adrian Prows of [facility].    *  S/p implanted loop recorder 04/2017.    *  NIDDM.  Poorly controlled, not compliant with home insulin.  HHS.  Insulin drip discontinued at 1900 last night.    *  OSA on CPAP.  COPD.   *   Chronic back pain. No pain meds received thus far.      PLAN   *  Colonoscopy +/- EGD tomorrow 12:30 PM.  Start clears and split dose prep beginning tonight.   Pt agreeable to proceed.     [facility] Attending   I have taken an interval history, reviewed the chart and examined the patient. I agree with the Advanced Practitioner's note, impression and recommendations.   Gatha Mayer, MD, [facility] [phone] (pager) 08/25/2017 4:29 PM  +++++++++++++++++++++++++++++++++++++++++++++++++++++++++++++++++++++++++++  SUBJECTIVE:   Chief complaint: none.  No stools.  No pain.  No trouble breathing.  Feels better       OBJECTIVE:         Vital signs in last 24 hours:    Temp:  [98 F (36.7 C)-98.6 F (37 C)] 98.5 F (36.9 C) (02/13 0800) Pulse Rate:  [65-89] 74 (02/13 0800) Resp:  [12-26] 26 (02/13 0320) BP: (98-141)/(65-102) 128/94 (02/13 0800) SpO2:  [94 %-100 %] 98 % (02/13 0800) Weight:  [95.7 kg (211 lb)] 95.7 kg (211 lb) (02/12 0836)   Filed Weights   08/24/17 0836  Weight: 95.7 kg (211 lb)   General: looks well   Heart: RRR Chest: clear bil.  No cough or dyspnea Abdomen: soft, NT,ND.  Active BS  Extremities: no CCE Neuro/Psych:  Alert, appropriate, oriented x 3.    Intake/Output from previous  day: 02/12 0701 - 02/13 0700 In: 1000 [IV Piggyback:1000] Out: 900 [Urine:900]  Lab Results: Recent Labs    08/24/17 0845 08/25/17 0249  WBC 6.2 7.5  HGB 8.1* 7.5*  HCT 27.6* 25.3*  PLT 325 290   BMET Recent Labs    08/24/17 0845 08/24/17 1927 08/25/17 0249  NA 130* 141 139  K 3.5 3.2* 2.9*  CL 94* 106 105  CO2 23 26 24   GLUCOSE 898* 141* 182*  BUN 9 7 8   CREATININE 1.35* 1.18 1.08  CALCIUM 9.5 9.2 8.9      Azucena Freed  08/25/2017, 8:20 AM Pager: [phone]

## 2017-08-25 NOTE — Progress Notes (Signed)
Internal Medicine Attending  Date: 08/25/2017  Patient name: James Zuniga Medical record number: [id number] Date of birth: [birth year] Age: 62 y.o. Gender: male  I saw and evaluated the patient. I reviewed the resident's note by Dr. Trilby Drummer and I agree with the resident's findings and plans as documented in his progress note.  Please see my H&P dated 08/25/2017 for the specifics of my evaluation, assessment, and plan from earlier in the day.

## 2017-08-25 NOTE — Progress Notes (Signed)
Subjective: James Zuniga was seen eating in his chair today. He states that he feels much better today. His confusion has improved. He states that he stills has some blurry vision and was informed that his vision changes were most likely due to his elevated glucose and that they should improve with time. He has already been seen by nutrition and the diabetes coordinator who have given him and his wife further education on diabetes and how diet affects his disease. They have already obtained a glucometer and are practicing the injection and making plans to keep him blood sugar under control at home with the assistance of his PCP. He was seen by GI today and will be undergoing a prep tonight with plans for conlonscopy to evaluate his suspected chronic GI bleed.  Objective:  Vital signs in last 24 hours: Vitals:   08/24/17 1800 08/24/17 2015 08/24/17 2257 08/25/17 0320  BP: 124/85 109/73 98/65 117/82  Pulse: 68 73  68  Resp: (!) 25 (!) 25  (!) 26  Temp:  98.6 F (37 C) 98.5 F (36.9 C) 98.3 F (36.8 C)  TempSrc:  Oral Oral Oral  SpO2: 97% 95%  99%  Weight:      Height:       Physical Exam  Constitutional: He appears well-developed and well-nourished.  HENT:  Head: Normocephalic and atraumatic.  Eyes: EOM are normal. Right eye exhibits no discharge. Left eye exhibits no discharge.  Cardiovascular: Normal rate, regular rhythm, normal heart sounds and intact distal pulses.  Pulmonary/Chest: Effort normal and breath sounds normal. No respiratory distress.  Abdominal: Soft. Bowel sounds are normal. He exhibits no distension. There is no tenderness.  Musculoskeletal: He exhibits no edema or deformity.  Neurological: He is alert.  Skin: Skin is warm and dry.   Assessment/Plan: 62 yo M with a history of CVA (2018), CHF, and Diabetes who presents with confusion and fatigue who was found to have elevated blood glucose and low hemoglobin at his PCP visit 2/11. With Glucose 898 and Hgb 8.1 in  ED.  GI Bleed Iron Deficiency Anemia Patient presented with Hgb 8.1 down from baseline of 14.3. GI consulted in ED. Iron deficiency is in the setting of this suspected chronic GI bleed. - Appreciate GI recommendations - Will hold ASA and Plavix - Conlonscopy at 10am 2/14, Bowl Prep tonight - Iron Supplementation  HHS Diabetes HHS Resolved. Patient on Trulicity and Metformin in the past, off medications since 04/2017 2/2 to cost. Patient's blood sugar improved on insulin gtt in the evening of 2/12 and patient was transitioned to subcutaneous Insulin.  - Transfer to Med/Surg and Discontinue Cardiac Monitoring - Seen by Nutrition and Diabetes Coordinator for further education - Continue IVF in the setting of bowl prep and npo status for conlonscopy tomorrow - Lantus 20 Units qhs - SSI-M w/ qhs coverage  Hypokalemia: In the setting of recent Insulin drip. Potassium may raise once it shifts back into intravascular space off insulin gtt. However, K dropped from 3.2 to 2.9 despite 61mEq PO. - Have repleted additional 136mEq, (71mEq q4h)  - Will recheck K >> If failing to improve, will add K to IVF  CHF: Last Echo 12/25/16: EF 40-45%, Diffuse hypokinesis, PeakPAP 65mmHg. - Continue home metoprolol 25mg  Daily - Holding home lasix in the setting of soft-normal BP and continued volume depletion 2/2 HHS  HTN: - Continue home metoprolol 25mg  Daily - Holding home lasix in the setting of soft-normal BP and continued volume depletion 2/2 HHS  CVA: In 2018 - Holding home ASA 81 and Plavix - Continue home atorvastatin  COPD - Continue albuterol neb, q6h PRN - Continue Duoneb, q6h PRN - Continue home fluticasone, 1 puff BID  Chronic Back Pain: Takes Tramadol at home. - Continue home tramadol 50mg  Daily, PRN Pain  FEN: Clears then Prep tonight, NPO 8am for Colonoscopy DVT Prophylaxis: SCDs, No ChemoPPX in setting of chronic GI Bleed Code Status: Full  Dispo: Anticipated discharge in  approximately 1-2 day(s).   James Seat, MD 08/25/2017, 6:11 AM Pager: [phone]

## 2017-08-25 NOTE — H&P (Addendum)
Internal Medicine Attending Admission Note Date: 08/25/2017  Patient name: James Zuniga Medical record number: [id number] Date of birth: 1955/07/25 Age: 62 y.o. Gender: male  I saw and evaluated the patient. I reviewed the resident's note and I agree with the resident's findings and plan as documented in the resident's note.  Chief Complaint(s): Polyuria, polydipsia, blurred vision 3 weeks.  History - key components related to admission:  James Zuniga is a 62 year old man with a history of diabetes, prior cerebrovascular accident, essential hypertension, and hyperlipidemia who presents with a three-week history of polyuria, polydipsia, and blurred vision. Over the last 2 weeks his family has noted that his mental processing has been sluggish. While incredibly thirsty he has been drinking nondiet Pepsi and juices. He has been unable to afford his Trulisity for his diabetes over the last several months. He presented to his primary care provider the day prior to admission for these symptoms and was found to be anemic and hyperglycemic. The recommendation was that he proceed to the emergency department, but he deferred it to the following day. In the emergency department he was noted to have a blood sugar over 800 without an anion gap and an iron deficient microcytic anemia and therefore was admitted to the internal medicine teaching service for further evaluation and care.  He was hydrated and placed on an insulin drip with resolution of his confusion, polyuria, and polydipsia. He still noted some blurry vision when seen on rounds this morning. With regards to his iron deficiency anemia, he denied taking any recent blood thinners, aspirin, or NSAIDs. He has had intermittent dark stools but denies bright red blood per rectum or maroon stools. He had a colonoscopy 5 years ago which was reportedly unremarkable. He is never had an EGD.  Physical Exam - key components related to admission:  Vitals:   08/25/17  0800 08/25/17 1126 08/25/17 1603 08/25/17 1916  BP: (!) 128/94 (!) 132/99 113/86 129/87  Pulse: 74 71 85 81  Resp:   12   Temp: 98.5 F (36.9 C) 97.9 F (36.6 C) 98.2 F (36.8 C) 98.1 F (36.7 C)  TempSrc: Oral Oral Oral Oral  SpO2: 98% 100% 98%   Weight:      Height:       Gen.: Well-developed, well-nourished, man sitting comfortably in a recliner in no acute distress. Lungs: Clear to auscultation bilaterally without wheezes, rhonchi, rales. Heart: Regular rate and rhythm Abdomen: Soft, nontender, without guarding or rebound.  Lab results:  Basic Metabolic Panel: Recent Labs    08/24/17 1927 08/25/17 0249 08/25/17 1604  NA 141 139  --   K 3.2* 2.9* 4.1  CL 106 105  --   CO2 26 24  --   GLUCOSE 141* 182*  --   BUN 7 8  --   CREATININE 1.18 1.08  --   CALCIUM 9.2 8.9  --    Liver Function Tests: Recent Labs    08/24/17 0935  AST 18  ALT 15*  ALKPHOS 189*  BILITOT 0.4  PROT 7.2  ALBUMIN 3.2*   CBC: Recent Labs    08/24/17 0845 08/25/17 0249  WBC 6.2 7.5  HGB 8.1* 7.5*  HCT 27.6* 25.3*  MCV 81.2 78.6  PLT 325 290   CBG: Recent Labs    08/24/17 2147 08/24/17 2257 08/25/17 0848 08/25/17 1153 08/25/17 1708 08/25/17 1750  GLUCAP 202* 199* 238* 332* 536* 402*   Anemia Panel: Recent Labs    08/24/17 1515  FERRITIN 8*  TIBC 378  IRON 20*   Coagulation: Recent Labs    08/24/17 0935  INR 1.02   Urinalysis:  Clear, colorless, specific gravity 1.018, pH 7.0, glucose greater than 500, ketones negative, protein negative, nitrite negative, leukocytes negative, 0-5 red blood cells per high-power field, 0-5 white blood cells per high-power field.  Misc. Labs:  Serum osmolality 319 FOBT positive  Imaging results:  Dg Chest 2 View  Result Date: 08/24/2017 CLINICAL DATA:  Hyperglycemia. EXAM: CHEST  2 VIEW COMPARISON:  Radiograph of Nov 12, 2016. FINDINGS: Stable cardiomegaly. No pneumothorax or pleural effusion is noted. No acute pulmonary  disease is noted. Bony thorax is unremarkable. IMPRESSION: No active cardiopulmonary disease. Electronically Signed   By: Marijo Conception, M.D.   On: 08/24/2017 10:27   Ct Head Wo Contrast  Result Date: 08/24/2017 CLINICAL DATA:  Confusion and dizziness for 1 week, hypoglycemic, history CHF, coronary artery disease, type II diabetes mellitus, hypertension, smoker, COPD idiopathic cardiomyopathy EXAM: CT HEAD WITHOUT CONTRAST TECHNIQUE: Contiguous axial images were obtained from the base of the skull through the vertex without intravenous contrast. Sagittal and coronal MPR images reconstructed from axial data set. COMPARISON:  12/22/2016 CT head Correlation: MR brain 12/22/2016 FINDINGS: Brain: Normal ventricular morphology. Minimal atrophy. No midline shift or mass effect. Small vessel chronic ischemic changes of deep cerebral white matter. Otherwise normal appearance of brain parenchyma. No intracranial hemorrhage, mass lesion, or evidence of acute infarction. No extra-axial fluid collections. Vascular: No hyperdense vessels Skull: Intact Sinuses/Orbits: Clear Other: N/A IMPRESSION: Minimal atrophy with small vessel chronic ischemic changes of deep cerebral white matter. No acute intracranial abnormalities. Electronically Signed   By: Lavonia Dana M.D.   On: 08/24/2017 10:31   Chest x-ray: Personally reviewed. Badly rotated film. No effusions, infiltrates, or masses seen. No significant changes from the previous chest x-ray from 11/12/2016.  CT of the head without contrast: Personally reviewed. No evidence of acute bleed.  Other results:  EKG: Personally reviewed. Normal sinus rhythm at 69 bpm, left axis deviation, normal intervals, left anterior hemiblock, significant Q waves in V1, no LVH by voltage, poor R wave progression, no ST or T-wave changes. There are no significant changes from the previous ECG on 12/22/2016.  Assessment & Plan by Problem:  James Zuniga is a 62 year old man with a history of  diabetes, prior cerebrovascular accident, essential hypertension, and hyperlipidemia who presents with a three-week history of polyuria, polydipsia, and blurred vision. He was found to be in a hyperosmolar hyperglycemic state and responded well to fluids and IV insulin with resolution of his polyuria and polydipsia and improvement, but not resolution yet, of his blurred vision. The cause of this was an inability to afford his trulicity. It would be prudent to prescribe him the 70/30 insulin from [employer] if finances are difficult. The cause of his iron deficiency anemia is likely to be a GI blood loss given the Hemoccult positivity.  1) Diabetes: He has been started on basal insulin as well as a sliding scale. Upon discharge he would benefit from [employer] 70/30 insulin given it is the least expensive on the market. While an inpatient he has been getting diabetic teaching including nutrition. His wife has already picked up a glucometer. We are hopeful that through self-management he can avoid further episodes of hyperosmolar hyperglycemic state.  2) Iron deficiency anemia: He is currently being prepped for an EGD and colonoscopy scheduled for tomorrow. Further assessment and plan are pending the results of these studies.  3) Disposition: If his EGD and colonoscopy are unremarkable I suspect he will be ready for discharge tomorrow afternoon. Follow-up will be with his primary care provider. If further GI evaluation is necessary it may include a capsule endoscopy if the EGD and colonoscopy are negative. This can be done in the outpatient setting.  Addendum 08/30/2017: To clarify documentation he has a diagnosis of chronic systolic heart failure for which we continued his outpatient regimen during the hospitalization.

## 2017-08-25 NOTE — Progress Notes (Signed)
Inpatient Diabetes Program Recommendations  AACE/ADA: New Consensus Statement on Inpatient Glycemic Control (2015)  Target Ranges:  Prepandial:   less than 140 mg/dL      Peak postprandial:   less than 180 mg/dL (1-2 hours)      Critically ill patients:  140 - 180 mg/dL   Lab Results  Component Value Date   GLUCAP 332 (H) 08/25/2017   HGBA1C 6.6 (H) 12/23/2016   1045 am  Spoke with patient and wife about home regimen and need for insulin outpatient. Patient was prescribed Trulicity weekly and was unable to afford. Spoke with patient and wife about A1c and glucose goals. Instructed patient and wife on how to drawn up and administer insulin via vial and syringe. Patient and wife to practice technique.  1300 pm Spoke with patient and wife again and watched successful return demonstration of drawing up and administration of insulin via vial and syringe. Gave patient's wife handout on ReliOn DM products. Will follow as needed.  Thanks,  Tama Headings RN, MSN, [facility] Inpatient Diabetes Coordinator Team Pager [phone] (8a-5p)

## 2017-08-25 NOTE — Care Management Obs Status (Signed)
[place] NOTIFICATION   Patient Details  Name: James Zuniga MRN: [id number] Date of Birth: [birth year]   Medicare Observation Status Notification Given:  Yes    Carles Collet, RN 08/25/2017, 11:50 AM

## 2017-08-25 NOTE — Progress Notes (Signed)
Nutrition Brief Note  Patient identified on the Malnutrition Screening Tool (MST) Report  Wt Readings from Last 15 Encounters:  08/24/17 211 lb (95.7 kg)  08/04/17 215 lb 9.6 oz (97.8 kg)  03/17/17 208 lb 6.4 oz (94.5 kg)  03/08/17 207 lb 6.4 oz (94.1 kg)  12/25/16 212 lb 4.9 oz (96.3 kg)  12/18/16 211 lb (95.7 kg)  12/16/16 201 lb (91.2 kg)  11/15/16 207 lb 3.7 oz (94 kg)  11/11/16 201 lb (91.2 kg)  09/16/16 211 lb (95.7 kg)  07/31/16 211 lb (95.7 kg)  06/19/16 213 lb 1.9 oz (96.7 kg)  03/13/16 203 lb (92.1 kg)  12/06/15 207 lb (93.9 kg)  10/29/15 205 lb (93 kg)   Mr. Ahart is a 62 yo M with a history of CVA (2018), CHF, and Diabetes who presents with confusion and fatigue who was found to have elevated blood glucose and low hemoglobin at his PCP visit 2/11.   Pt admitted with HHS.   2/12- insulin drip d/c  Spoke with pt and wife at bedside. Pt reports fair appetite over the past 3-4 weeks; pt typically consumes 2 meals per day (meals consist of meat, starch, and vegetable). Pt reports fair glycemic control up until one month ago, when he misplaced his glucometer. Pt wife shares that pt has been prescribed Trulicity for DM, however, has not taken since September 2018 due to "donut hole" and increased expense. Pt estimates he has lost 11# in the past month, which he attributes to fasting, however, this is not consistent with wt hx.   Pt admits to consume 2 L of Pepsi almost daily (other frequently consumed beverages include orange juice and cranberry juice).   Per discussion with RN, DM coordinator has already seen pt and have recommended Reli-On ([employer]) insulin and glucometer. Pt wife has already picked up glucometer this AM. Pt has been practicing administering insulin injections and pt wife is also very engaged with this process and good support for pt.   Education focused on eliminating high calorie beverages in diet. Discussed with pt and wife low calorie alternatives to sodas  and juices, such as flavored water and low calorie drink mixes. Pt does not like diet sodas. Pt wife reports pt has had DM for many years and has no further questions at this time. Reinforced importance of self-management compliance for optimal glycemic control.   Pt scheduled for EGD and colonoscopy tomorrow per GI service.   Labs reviewed: K: 2.9 (on PO supplementation), CBGS: 199-238(inpatient orders for glycemic control are 0-15 units insulin aspart TID with meals, 0-5 units insulin aspart q HS, and 15 units insulin glargine q HS).   Body mass index is 34.06 kg/m. Patient meets criteria for obesity, class II based on current BMI.   Current diet order is clear liquid, patient is consuming approximately 75% of meals at this time. Labs and medications reviewed.   No nutrition interventions warranted at this time. If nutrition issues arise, please consult RD.   Raahi Korber A. Jimmye Norman, RD, LDN, CDE Pager: [phone] After hours Pager: [phone]

## 2017-08-25 NOTE — H&P (View-Only) (Signed)
          Daily Rounding Note  08/25/2017, 8:20 AM  LOS: 0 days    ASSESMENT:   *  Anemia, declining MCV and Hgb c/w 8 months ago.  FOBT + dark stool. Low dose Omeprazole at home.  Hgb relatively stable considering how much IVF he's received.    Screening colonoscopy unremarkable 2014.    *  Chronic ASA/Plavix for CVA, ? Afib.  Last dose  2/12 AM.   Cardiologist is Adrian Prows of [facility].    *  S/p implanted loop recorder 04/2017.    *  NIDDM.  Poorly controlled, not compliant with home insulin.  HHS.  Insulin drip discontinued at 1900 last night.    *  OSA on CPAP.  COPD.   *   Chronic back pain. No pain meds received thus far.      PLAN   *  Colonoscopy +/- EGD tomorrow 12:30 PM.  Start clears and split dose prep beginning tonight.   Pt agreeable to proceed.     [facility] Attending   I have taken an interval history, reviewed the chart and examined the patient. I agree with the Advanced Practitioner's note, impression and recommendations.   Gatha Mayer, MD, [facility] [phone] (pager) 08/25/2017 4:29 PM  +++++++++++++++++++++++++++++++++++++++++++++++++++++++++++++++++++++++++++  SUBJECTIVE:   Chief complaint: none.  No stools.  No pain.  No trouble breathing.  Feels better       OBJECTIVE:         Vital signs in last 24 hours:    Temp:  [98 F (36.7 C)-98.6 F (37 C)] 98.5 F (36.9 C) (02/13 0800) Pulse Rate:  [65-89] 74 (02/13 0800) Resp:  [12-26] 26 (02/13 0320) BP: (98-141)/(65-102) 128/94 (02/13 0800) SpO2:  [94 %-100 %] 98 % (02/13 0800) Weight:  [95.7 kg (211 lb)] 95.7 kg (211 lb) (02/12 0836)   Filed Weights   08/24/17 0836  Weight: 95.7 kg (211 lb)   General: looks well   Heart: RRR Chest: clear bil.  No cough or dyspnea Abdomen: soft, NT,ND.  Active BS  Extremities: no CCE Neuro/Psych:  Alert, appropriate, oriented x 3.    Intake/Output from previous  day: 02/12 0701 - 02/13 0700 In: 1000 [IV Piggyback:1000] Out: 900 [Urine:900]  Lab Results: Recent Labs    08/24/17 0845 08/25/17 0249  WBC 6.2 7.5  HGB 8.1* 7.5*  HCT 27.6* 25.3*  PLT 325 290   BMET Recent Labs    08/24/17 0845 08/24/17 1927 08/25/17 0249  NA 130* 141 139  K 3.5 3.2* 2.9*  CL 94* 106 105  CO2 23 26 24   GLUCOSE 898* 141* 182*  BUN 9 7 8   CREATININE 1.35* 1.18 1.08  CALCIUM 9.5 9.2 8.9      Azucena Freed  08/25/2017, 8:20 AM Pager: [phone]

## 2017-08-25 NOTE — Discharge Summary (Signed)
Name: James Zuniga MRN: [id number] DOB: 11-May-1956 62 y.o. PCP: Berkley Harvey, NP  Date of Admission: 08/24/2017  8:50 AM Date of Discharge: 08/26/2017 Attending Physician: Oval Linsey, MD  Discharge Diagnosis:  Active Problems:   Chronic iron deficiency anemia   Type 2 diabetes mellitus with hyperosmolar nonketotic hyperglycemia (HCC)   Heme + stool   Gastric polyp   Discharge Medications: Allergies as of 08/26/2017   No Known Allergies     Medication List    STOP taking these medications   fluticasone 220 MCG/ACT inhaler Commonly known as:  FLOVENT HFA     TAKE these medications   albuterol (2.5 MG/3ML) 0.083% nebulizer solution Commonly known as:  PROVENTIL Take 2.5 mg by nebulization every 6 (six) hours as needed for wheezing or shortness of breath.   allopurinol 300 MG tablet Commonly known as:  ZYLOPRIM Take 300 mg by mouth daily.   amLODipine 5 MG tablet Commonly known as:  NORVASC Take 5 mg by mouth daily.   aspirin 81 MG EC tablet Take 81 mg by mouth daily. Swallow whole.   atorvastatin 80 MG tablet Commonly known as:  LIPITOR Take 1 tablet (80 mg total) by mouth every morning.   clopidogrel 75 MG tablet Commonly known as:  PLAVIX Take 1 tablet (75 mg total) by mouth daily.   DULoxetine 60 MG capsule Commonly known as:  CYMBALTA Take 60 mg by mouth daily.   ferrous sulfate 325 (65 FE) MG tablet Take 1 tablet (325 mg total) by mouth 3 (three) times daily with meals.   furosemide 20 MG tablet Commonly known as:  LASIX Take 20 mg by mouth 2 (two) times a week.   gabapentin 300 MG capsule Commonly known as:  NEURONTIN Take 300 mg by mouth 4 (four) times daily.   insulin NPH-regular Human (70-30) 100 UNIT/ML injection Commonly known as:  NOVOLIN 70/30 Please inject 25 units twice daily before breakfast and dinner.   ipratropium-albuterol 0.5-2.5 (3) MG/3ML Soln Commonly known as:  DUONEB Take 3 mLs by nebulization every 6 (six) hours  as needed. DX: J44.9   losartan 100 MG tablet Commonly known as:  COZAAR Take 100 mg by mouth daily.   metoprolol succinate 25 MG 24 hr tablet Commonly known as:  TOPROL-XL Take 1 tablet (25 mg total) by mouth daily. What changed:  when to take this   omeprazole 20 MG capsule Commonly known as:  PRILOSEC Take 20 mg by mouth daily.   OXYGEN Inhale 2 L into the lungs as needed.   potassium chloride SA 20 MEQ tablet Commonly known as:  K-DUR,KLOR-CON Take 1 tablet (20 mEq total) by mouth 2 (two) times daily.   primidone 50 MG tablet Commonly known as:  MYSOLINE Take 100 mg by mouth at bedtime.   tamsulosin 0.4 MG Caps capsule Commonly known as:  FLOMAX Take 1 capsule (0.4 mg total) by mouth daily after breakfast.   tiZANidine 4 MG tablet Commonly known as:  ZANAFLEX 4 mg 3 (three) times daily as needed for muscle spasms.   traMADol 50 MG tablet Commonly known as:  ULTRAM Take 50 mg by mouth daily as needed for moderate pain.   zolpidem 10 MG tablet Commonly known as:  AMBIEN Take 10 mg by mouth at bedtime.       Disposition and follow-up:   JamesTravers L Zuniga was discharged from [facility] in Stable condition.  At the hospital follow up visit please address:  1.  HHS, Diabetes: Ensure patient has picked up and has been taking his Insulin. Patient has obtained a glucometer, please follow up on his home readings and repeat A1C in 3 months. Adjust Insulin dose based on these results. Also, ask patient about improvement in his blurry vision, it may take 3 months for the effects of hyperglycemia associated vision changes to resolve.  2. Heme + Stool, Iron Deficiency Anemia: Please inquire about signs of recurrent blood in stool. Ensure patient has been taking Iron Supplementation. Consider follow up CBC to monitor Hgb and Iron studies to evaluate response to supplementation. Ensure patient is aware of follow up GI appointment. Patient noted to have received IV  and oral Iron supplementation in the past per GI notes.  3. Other: Ensure patient know that if he is having difficulty affording some medications he should speak with his PCP about other options. He stated he did not realize this was an option after he realized he was unable to afford his prescribed diabetes medication. He was already informed of this inpatient, but it is likely worth reiterating.  4.  Labs / imaging needed at time of follow-up: CBC  5.  Pending labs/ test needing follow-up: None  Follow-up Appointments:   Hospital Course by problem list:   HHS Diabetes 62 yo M with a history of CVA (2018), CHF, and Diabetes presented with  3-4 weeks of forgetfulness, confusion, sluggishness, weakness, increased urination and increased thirst, with reported elevated glucose and anemia at his PCP the day before. In the ED, patient had glucose 898. He had no acidosis, no elevated anion gap, no ketonuria. He reported having stopped his diabetic medications in Oct 2018 after being unable to afford them. He was started on Insulin gtt and IVF in  the ED. Blood glucose trended down and patient was transitioned subcutaneous insulin. He was discharged with prescription for 70/30 Insulin 25U BID WC.  GI Bleed Iron Deficiency Anemia Patient presented with Hgb 8.1 down from baseline of 14.3.Fecal Occult Blood test was positive. On ROS patient endorse some blood in his stool and some dark tarry stools. GI was consulted in the ED. He was also found to haveIron deficiency in the setting of this suspected chronic bleed. He underwent EGD and Colonoscopy following resolution of his HHS. He was found to have one gastric polyp on EGD (negative for H. Pylori) and normal Colonoscopy; no source of bleeding was identified. IgA and TTG were check to evaluate for celiac disease, which were negative. GI note indicated that patient had previously received iron supplementation as an outpatient including IV iron. Patient  received a dose of parenteral Iron while inpatient and discharged on oral iron supplement.  Discharge Vitals:   BP 119/79   Pulse 72   Temp 97.7 F (36.5 C) (Oral)   Resp (!) 21   Ht 5\' 6"  (1.676 m)   Wt 211 lb (95.7 kg)   SpO2 100%   BMI 34.06 kg/m    Pertinent Labs, Studies, and Procedures:  CBC Latest Ref Rng & Units 08/26/2017 08/25/2017 08/24/2017  WBC 4.0 - 10.5 K/uL 8.4 7.5 6.2  Hemoglobin 13.0 - 17.0 g/dL 8.8(L) 7.5(L) 8.1(L)  Hematocrit 39.0 - 52.0 % 29.7(L) 25.3(L) 27.6(L)  Platelets 150 - 400 K/uL 342 290 325   BMP Latest Ref Rng & Units 08/26/2017 08/25/2017 08/25/2017  Glucose 65 - 99 mg/dL 122(H) - 182(H)  BUN 6 - 20 mg/dL 6 - 8  Creatinine 0.61 - 1.24 mg/dL 1.12 - 1.08  Sodium 135 - 145 mmol/L 141 - 139  Potassium 3.5 - 5.1 mmol/L 3.2(L) 4.1 2.9(L)  Chloride 101 - 111 mmol/L 105 - 105  CO2 22 - 32 mmol/L 25 - 24  Calcium 8.9 - 10.3 mg/dL 9.3 - 8.9   IgA 139  Ref Range 61 - 437 mg/dL   Tissue Transglutaminase Ab, IgA <2 Ref Range 0 - 3 U/mL    Ref Range & Units 08/24/17  Ferritin 24 - 336 ng/mL 8     Abnormally low      Ref Range & Units 08/24/17  Iron 45 - 182 ug/dL 20 Abnormally low    TIBC 250 - 450 ug/dL 378   Saturation Ratios 17.9 - 39.5 % 5 Abnormally low    UIBC ug/dL 358    Fecal Occult Blood: Positive  EGD:  Findings: A single 5 mm sessile polyp with no stigmata of recent bleeding was found in the gastric antrum. Biopsies were taken with a cold forceps for histology. Verification of patient identification for the specimen was done. Estimated blood loss was minimal. - The exam was otherwise without abnormality. - The cardia and gastric fundus were normal on retroflexion. Impression: - A single gastric polyp. Biopsied. - The examination was otherwise normal. Recommendation: - Return patient to hospital ward for ongoing care. - Diabetic (ADA) diet. - Continue present medications. - Await pathology results.  Coloscopy: Findings: The  perianal and digital rectal examinations were normal. Pertinent negatives include normal prostate (size, shape, and consistency). - The colon (entire examined portion) appeared normal. - No additional abnormalities were found on retroflexion. Impression: - The entire examined colon is normal. Adequate prep. The residual fluid and some fecal debris was iron-colored. - No specimens collected. - CAUSE OF HEME + IRON DEFICIENCY ANEMIA NOT CLEAR - ANTRAL POLYP AT EGD NOT A CAUSE. I FOUND FURTHER INFO THAT HE WAS IRON-DEFICIENT IN 2014 SO THIS IS NOT NEW SO MALIGNANCY QUITE UNLIKELY GIVEN DURATION. 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 - HE HAS HAD SOME BRIGHT RED BLOOD WITH WIPING SO HEMORRHOIDS/ANORECTAL IRRITATION MIGHT BE CAUSE HEME + STOOL AND NOT ALWAYS EASILY SEEN AT COLONOSCOPY - I WOULD RX WITH PARENTERAL IRON IF POSSIBLE - I WILL CONTACT PATIENT WITH PATH RESULTS FROM EGD AND ANY NEW RECOMMENDATIONS - I THINK HE CAN GO BACK ON CLOPIDOGREL AND ASA NOW CELIAC DZ UNLIKLEY BUT WILL CHECK TTG aB JUST IN CASE AS I DIO NOT BIOPSY DUODENUM - ALSO NOTE THAT HE SAID HE UESD TO SEE DR. ENNEVER AND GET PARENTERAL IRON - HE HAS BEEN OFF THAT AND PO IRON SO LIKELY REASON WHY ANEMIC AGAIN Recommendations: - Repeat colonoscopy in 10 years for screening purposes. - Patient has a contact number available for emergencies. The signs and symptoms of potential delayed complications were discussed with the patient. Return to normal activities tomorrow. Written discharge instructions were provided to the patient. - Resume previous diet.  CXR: IMPRESSION: - No active cardiopulmonary disease.  CT Head: IMPRESSION: - Minimal atrophy with small vessel chronic ischemic changes of deep cerebral white matter. - No acute intracranial abnormalities.  Discharge Instructions: Discharge  Instructions    Diet - low sodium heart healthy   Complete by:  As directed    Discharge instructions   Complete by:  As directed    It was pleasure taking care of you. Please take your insulin  NovoLog 70/30 25 units twice daily. Please follow-up with your primary care within 1 week. Keep checking your blood sugar level at least 3 times a day and bring that log to your primary care so they can adjust your insulin dosage. Also follow-up with your gastroenterologist as directed. Keep taking your iron supplement as directed.   Increase activity slowly   Complete by:  As directed       Signed: Neva Seat, MD 08/27/2017, 7:44 PM   Pager: [phone]

## 2017-08-26 ENCOUNTER — Encounter (HOSPITAL_COMMUNITY): Payer: Self-pay | Admitting: *Deleted

## 2017-08-26 ENCOUNTER — Inpatient Hospital Stay (HOSPITAL_COMMUNITY): Payer: PPO | Admitting: Anesthesiology

## 2017-08-26 ENCOUNTER — Encounter (HOSPITAL_COMMUNITY)
Admission: EM | Disposition: A | Discharge status: Home / Self Care | Payer: Self-pay | Source: Home / Self Care | Attending: Internal Medicine

## 2017-08-26 DIAGNOSIS — R195 Other fecal abnormalities: Secondary | ICD-10-CM

## 2017-08-26 DIAGNOSIS — K317 Polyp of stomach and duodenum: Secondary | ICD-10-CM

## 2017-08-26 HISTORY — PX: COLONOSCOPY: SHX5424

## 2017-08-26 HISTORY — PX: ESOPHAGOGASTRODUODENOSCOPY: SHX5428

## 2017-08-26 LAB — BASIC METABOLIC PANEL
ANION GAP: 11 (ref 5–15)
BUN: 6 mg/dL (ref 6–20)
CHLORIDE: 105 mmol/L (ref 101–111)
CO2: 25 mmol/L (ref 22–32)
Calcium: 9.3 mg/dL (ref 8.9–10.3)
Creatinine, Ser: 1.12 mg/dL (ref 0.61–1.24)
GFR calc Af Amer: >60 mL/min (ref >60)
GFR calc non Af Amer: >60 mL/min (ref >60)
Glucose, Bld: 122 mg/dL — ABNORMAL HIGH (ref 65–99)
Potassium: 3.2 mmol/L — ABNORMAL LOW (ref 3.5–5.1)
Sodium: 141 mmol/L (ref 135–145)

## 2017-08-26 LAB — CBC
HEMATOCRIT: 29.7 % — AB (ref 39.0–52.0)
HEMOGLOBIN: 8.8 g/dL — AB (ref 13.0–17.0)
MCH: 23.4 pg — AB (ref 26.0–34.0)
MCHC: 29.6 g/dL — ABNORMAL LOW (ref 30.0–36.0)
MCV: 79 fL (ref 78.0–100.0)
Platelets: 342 10*3/uL (ref 150–400)
RBC: 3.76 MIL/uL — AB (ref 4.22–5.81)
RDW: 15.6 % — ABNORMAL HIGH (ref 11.5–15.5)
WBC: 8.4 10*3/uL (ref 4.0–10.5)

## 2017-08-26 LAB — GLUCOSE, CAPILLARY
GLUCOSE-CAPILLARY: 137 mg/dL — AB (ref 65–99)
Glucose-Capillary: 141 mg/dL — ABNORMAL HIGH (ref 65–99)

## 2017-08-26 LAB — MAGNESIUM: Magnesium: 1.7 mg/dL (ref 1.7–2.4)

## 2017-08-26 SURGERY — EGD (ESOPHAGOGASTRODUODENOSCOPY)
Anesthesia: Monitor Anesthesia Care

## 2017-08-26 SURGERY — COLONOSCOPY
Anesthesia: Monitor Anesthesia Care

## 2017-08-26 MED ORDER — PROPOFOL 500 MG/50ML IV EMUL
INTRAVENOUS | Status: DC | PRN
  Administered 2017-08-26: 75 ug/kg/min via INTRAVENOUS

## 2017-08-26 MED ORDER — INSULIN NPH ISOPHANE & REGULAR (70-30) 100 UNIT/ML ~~LOC~~ SUSP: SUBCUTANEOUS | 3 refills | Status: DC

## 2017-08-26 MED ORDER — POTASSIUM CHLORIDE CRYS ER 20 MEQ PO TBCR
40.0000 meq | EXTENDED_RELEASE_TABLET | Freq: Four times a day (QID) | ORAL | Status: DC
  Administered 2017-08-26: 40 meq via ORAL
  Filled 2017-08-26: qty 2

## 2017-08-26 MED ORDER — FERROUS SULFATE 325 (65 FE) MG PO TABS: 325.0000 mg | ORAL_TABLET | Freq: Three times a day (TID) | ORAL | 3 refills | Status: DC

## 2017-08-26 MED ORDER — POTASSIUM CHLORIDE CRYS ER 20 MEQ PO TBCR: 40.0000 meq | EXTENDED_RELEASE_TABLET | Freq: Two times a day (BID) | ORAL | Status: DC

## 2017-08-26 MED ORDER — PROPOFOL 10 MG/ML IV BOLUS
INTRAVENOUS | Status: DC | PRN
  Administered 2017-08-26 (×2): 20 mg via INTRAVENOUS

## 2017-08-26 MED ORDER — SODIUM CHLORIDE 0.9 % IV SOLN
510.0000 mg | Freq: Once | INTRAVENOUS | Status: AC
  Administered 2017-08-26: 510 mg via INTRAVENOUS
  Filled 2017-08-26: qty 17

## 2017-08-26 MED ORDER — LACTATED RINGERS IV SOLN
INTRAVENOUS | Status: DC | PRN
  Administered 2017-08-26: 13:00:00 via INTRAVENOUS

## 2017-08-26 NOTE — Progress Notes (Signed)
   Patient Name: James Zuniga Date of Encounter: 08/26/2017, 9:00 AM    Subjective  Pt reports that he is doing well, not having any N/V, SOB, dyspnea, CP, swelling, abdominal pain, weakness, or dizziness. He reports noticing some blood on the toilet paper yesterday after his BM. Tolerated prep well for EGD + colonoscopy today.    Objective  BP 123/89 (BP Location: Left Arm)   Pulse 83   Temp 97.9 F (36.6 C) (Oral)   Resp 12   Ht 5\' 6"  (1.676 m)   Wt 95.7 kg (211 lb)   SpO2 98%   BMI 34.06 kg/m   General:  NAD Eyes:   anicteric Lungs:  clear Heart::  S1S2 no rubs, murmurs or gallops Abdomen:  soft and nontender, BS+ Ext:   no edema, cyanosis or clubbing  Lab Results  Component Value Date   WBC 8.4 08/26/2017   HGB 8.8 (L) 08/26/2017   HCT 29.7 (L) 08/26/2017   MCV 79.0 08/26/2017   PLT 342 08/26/2017    Screening colonoscopy unremarkable in 2014.    Assessment and Plan   1. Anemia- declining MCV and Hgb c/w 8 mos ago. FOBT and dark stool. Hgb improving (7.5 yday >> 8.8 today). Colonoscopy + EGD today.   2. Chronic ASA/Plavix use for CVA, ?Afib. Pt last done was 2/12 AM. Cardiologist- Adrian Prows [facility]  3. NIDDM- poorly controlled, HHS, insulin drip d/c at 1900 on 2/12  4. S/p implanted loop recorder 04/2017  5. OSA on CPAP. COPD.  6. Acid reflux- low dose omeprazole at home.   7. Hypokalemia- 3.2 today, monitor.

## 2017-08-26 NOTE — Transfer of Care (Signed)
Immediate Anesthesia Transfer of Care Note  Patient: James Zuniga  Procedure(s) Performed: ESOPHAGOGASTRODUODENOSCOPY (EGD) (N/A ) COLONOSCOPY (N/A )  Patient Location: Endoscopy Unit  Anesthesia Type:MAC  Level of Consciousness: drowsy and patient cooperative  Airway & Oxygen Therapy: Patient Spontanous Breathing  Post-op Assessment: Report given to RN, Post -op Vital signs reviewed and stable and Patient moving all extremities X 4  Post vital signs: Reviewed and stable  Last Vitals:  Vitals:   08/26/17 1215 08/26/17 1405  BP: (!) 133/94 101/74  Pulse: 62 84  Resp: 20 (!) 29  Temp: 36.6 C 36.5 C  SpO2: 99% 99%    Last Pain:  Vitals:   08/26/17 1405  TempSrc: Oral  PainSc:       Patients Stated Pain Goal: 4 (82/57/49 3552)  Complications: No apparent anesthesia complications

## 2017-08-26 NOTE — Op Note (Signed)
[facility] Patient Name: James Zuniga Procedure Date : 08/26/2017 MRN: [id number] Attending MD: Gatha Mayer , MD Date of Birth: May 19, 1956 CSN: [id number] Age: 62 Admit Type: Inpatient Procedure:                Upper GI endoscopy Indications:              Iron deficiency anemia, Heme positive stool Providers:                Gatha Mayer, MD, Burtis Junes, RN, William Dalton, Technician Referring MD:              Medicines:                Propofol per Anesthesia, Monitored Anesthesia Care Complications:            No immediate complications. Estimated Blood Loss:     Estimated blood loss was minimal. Procedure:                Pre-Anesthesia Assessment:                           - Prior to the procedure, a History and Physical                            was performed, and patient medications and                            allergies were reviewed. The patient's tolerance of                            previous anesthesia was also reviewed. The risks                            and benefits of the procedure and the sedation                            options and risks were discussed with the patient.                            All questions were answered, and informed consent                            was obtained. Prior Anticoagulants: The patient                            last took aspirin 2 days and Plavix (clopidogrel) 2                            days prior to the procedure. ASA Grade Assessment:                            III - A patient with severe systemic disease. After  reviewing the risks and benefits, the patient was                            deemed in satisfactory condition to undergo the                            procedure.                           After obtaining informed consent, the endoscope was                            passed under direct vision. Throughout the                             procedure, the patient's blood pressure, pulse, and                            oxygen saturations were monitored continuously. The                            EG-[id number] ([id number]) scope was introduced through the                            mouth, and advanced to the second part of duodenum.                            The upper GI endoscopy was accomplished without                            difficulty. The patient tolerated the procedure                            well. Scope In: Scope Out: Findings:      A single 5 mm sessile polyp with no stigmata of recent bleeding was       found in the gastric antrum. Biopsies were taken with a cold forceps for       histology. Verification of patient identification for the specimen was       done. Estimated blood loss was minimal.      The exam was otherwise without abnormality.      The cardia and gastric fundus were normal on retroflexion. Impression:               - A single gastric polyp. Biopsied.                           - The examination was otherwise normal. Moderate Sedation:      Please see anesthesia notes, moderate sedation not given Recommendation:           - Return patient to hospital ward for ongoing care.                           - Diabetic (ADA) diet.                           -  Continue present medications.                           - Await pathology results.                           - See the other procedure note for documentation of                            additional recommendations. Colonoscopy next Procedure Code(s):        --- Professional ---                           [phone], Esophagogastroduodenoscopy, flexible,                            transoral; with biopsy, single or multiple Diagnosis Code(s):        --- Professional ---                           K31.7, Polyp of stomach and duodenum                           D50.9, Iron deficiency anemia, unspecified                           R19.5, Other fecal  abnormalities CPT copyright 2016 [employer]. All rights reserved. The codes documented in this report are preliminary and upon coder review may  be revised to meet current compliance requirements. Gatha Mayer, MD 08/26/2017 2:06:52 PM This report has been signed electronically. Number of Addenda: 0

## 2017-08-26 NOTE — Interval H&P Note (Signed)
History and Physical Interval Note:  08/26/2017 12:31 PM  James Zuniga  has presented today for surgery, with the diagnosis of anemia, FOBT +  The various methods of treatment have been discussed with the patient and family. After consideration of risks, benefits and other options for treatment, the patient has consented to  Procedure(s): ESOPHAGOGASTRODUODENOSCOPY (EGD) (N/A) COLONOSCOPY (N/A) as a surgical intervention .  The patient's history has been reviewed, patient examined, no change in status, stable for surgery.  I have reviewed the patient's chart and labs.  Questions were answered to the patient's satisfaction.     Silvano Rusk

## 2017-08-26 NOTE — Progress Notes (Signed)
   Subjective: James Zuniga seen resting in bed this morning. He continues to feel well and states that he feels he is still improving. He continues to have some memory deficits but that appears to be his baseline considering his stroke lat year. He continues to endorse some blurry vision and was informed that his vision changes were most likely due to his elevated glucose and that they should improve with time. We discussed starting insulin on discharge and following closely with his PCP. He will be undergoing colonoscopy today. If results of conlonscopy are reassuring likely discharge today.  Objective:  Vital signs in last 24 hours: Vitals:   08/25/17 1603 08/25/17 1916 08/25/17 2110 08/26/17 0617  BP: 113/86 129/87 118/78 123/89  Pulse: 85 81 61 83  Resp: 12     Temp: 98.2 F (36.8 C) 98.1 F (36.7 C) 99.7 F (37.6 C) 97.9 F (36.6 C)  TempSrc: Oral Oral Oral Oral  SpO2: 98%  98% 98%  Weight:      Height:       Physical Exam  Constitutional: He appears well-developed and well-nourished.  HENT:  Head: Normocephalic and atraumatic.  Eyes: EOM are normal. Right eye exhibits no discharge. Left eye exhibits no discharge.  Cardiovascular: Normal rate, regular rhythm, normal heart sounds and intact distal pulses.  Pulmonary/Chest: Effort normal and breath sounds normal. No respiratory distress.  Abdominal: Soft. Bowel sounds are normal. He exhibits no distension. There is no tenderness.  Musculoskeletal: He exhibits no edema or deformity.  Neurological: He is alert.  Skin: Skin is warm and dry.   Assessment/Plan: 62 yo M with a history of CVA (2018), CHF, and Diabetes who presents with confusion and fatigue who was found to have elevated blood glucose and low hemoglobin at his PCP visit 2/11. With Glucose 898 and Hgb 8.1 in ED.  GI Bleed Iron Deficiency Anemia Patient presented with Hgb 8.1 down from baseline of 14.3. GI consulted in ED. Iron deficiency in the setting of this suspected  chronic GI bleed. Colonoscopy today. - Appreciate GI recommendations - Will hold ASA and Plavix - Conlonscopy Today - Feraheme 510mg  IV Once  - PO Iron 325mg  TID WC  HHS Diabetes HHS Resolved. Patient on Trulicity and Metformin in the past, off medications since 04/2017 2/2 to cost. Patient's on subcutaneous Insulin, to be discharged on 70/30. - Seen by Nutrition and Diabetes Coordinator for further education - Lantus 20 Units qhs - SSI-M w/ qhs coverage  Hypokalemia: Improving. 3.2 Today On Supplementation out patient with chronic lasix use. - 53mEq q6h x2 doses  HTN CHF: Last Echo 12/25/16: EF 40-45%, Diffuse hypokinesis, PeakPAP 54mmHg. - Continue home metoprolol 25mg  Daily - Will resume home lasix on discharge (holding in setting of recent HSS and volume depletion)  CVA: In 2018 - Holding home ASA 81 and Plavix - Continue home atorvastatin  FEN: NPO 8am for Colonoscopy, then HH/Carb Mod DVT Prophylaxis: SCDs, No ChemoPPX in setting of chronic GI Bleed Code Status: Full  Dispo: Anticipated discharge in approximately 0-1 day(s).   James Seat, MD 08/26/2017, 10:21 AM Pager: [phone]

## 2017-08-26 NOTE — Op Note (Addendum)
[facility] Patient Name: James Zuniga Procedure Date : 08/26/2017 MRN: [id number] Attending MD: Gatha Mayer , MD Date of Birth: [birth year] CSN: [id number] Age: 62 Admit Type: Inpatient Procedure:                Colonoscopy Indications:              Heme positive stool, Iron deficiency anemia Providers:                Gatha Mayer, MD, Burtis Junes, RN, William Dalton, Technician Referring MD:              Medicines:                Propofol per Anesthesia, Monitored Anesthesia Care Complications:            No immediate complications. Estimated blood loss:                            None. Estimated Blood Loss:     Estimated blood loss: none. Procedure:                Pre-Anesthesia Assessment:                           - Prior to the procedure, a History and Physical                            was performed, and patient medications and                            allergies were reviewed. The patient's tolerance of                            previous anesthesia was also reviewed. The risks                            and benefits of the procedure and the sedation                            options and risks were discussed with the patient.                            All questions were answered, and informed consent                            was obtained. Prior Anticoagulants: The patient                            last took aspirin 2 days and Plavix (clopidogrel) 2                            days prior to the procedure. ASA Grade Assessment:  III - A patient with severe systemic disease. After                            reviewing the risks and benefits, the patient was                            deemed in satisfactory condition to undergo the                            procedure.                           After obtaining informed consent, the colonoscope                            was passed under direct vision.  Throughout the                            procedure, the patient's blood pressure, pulse, and                            oxygen saturations were monitored continuously. The                            EC-[id number] ([id number]) scope was introduced through                            the anus and advanced to the the terminal ileum,                            with identification of the appendiceal orifice and                            IC valve. The quality of the bowel preparation was                            adequate. The colonoscopy was performed without                            difficulty. The patient tolerated the procedure                            well. The bowel preparation used was Miralax. The                            terminal ileum, ileocecal valve, appendiceal                            orifice, and rectum were photographed. Scope In: 1:47:04 PM Scope Out: 1:59:16 PM Scope Withdrawal Time: 0 hours 9 minutes 50 seconds  Total Procedure Duration: 0 hours 12 minutes 12 seconds  Findings:      The perianal and digital rectal examinations were normal. Pertinent       negatives include normal prostate (size, shape, and  consistency).      The colon (entire examined portion) appeared normal.      No additional abnormalities were found on retroflexion. Impression:               - The entire examined colon is normal. Adequate                            prep. The residual fluid and some fecal debris was                            iron-colored.                           - No specimens collected.                           CAUSE OF HEME + IRON DEFICIENCY ANEMIA NOT CLEAR -                            ANTRAL POLYP AT EGD NOT A CAUSE. I FOUND FURTHER                            INFO THAT HE WAS IRON-DEFICIENT IN 2014 SO THIS IS                            NOT NEW SO MALIGNANCY QUITE UNLIKELY GIVEN                            DURATION. 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                           HE HAS HAD SOME BRIGHT RED BLOOD WITH WIPING SO                            HEMORRHOIDS/ANORECTAL IRRITATION MIGHT BE CAUSE                            HEME + STOOL AND NOT ALWAYS EASILY SEEN AT                            COLONOSCOPY                           I WOULD RX WITH PARENTERAL IRON IF POSSIBLE  I WILL CONTACT PATIENT WITH PATH RESULTS FROM EGD                            AND ANY NEW RECOMMENDATIONS - I THINK HE CAN GO                            BACK ON CLOPIDOGREL AND ASA NOW                           CELIAC DZ UNLIKLEY BUT WILL CHECK TTG aB JUST IN                            CASE AS I DIO NOT BIOPSY DUODENUM                           SIGNING OFF                           ALSO NOTE THAT HE SAID HE UESD TO SEE DR. ENNEVER                            AND GET PARENTERAL IRON - HE HAS BEEN OFF THAT AND                            PO IRON SO LIKELY REASON WHY ANEMIC AGAIN Moderate Sedation:      Please see anesthesia notes, moderate sedation not given Recommendation:           - Repeat colonoscopy in 10 years for screening                            purposes.                           - Patient has a contact number available for                            emergencies. The signs and symptoms of potential                            delayed complications were discussed with the                            patient. Return to normal activities tomorrow.                            Written discharge instructions were provided to the                            patient.                           - Resume previous diet.                           -  Continue present medications. Procedure Code(s):        --- Professional ---                           [phone],  Colonoscopy, flexible; diagnostic, including                            collection of specimen(s) by brushing or washing,                            when performed (separate procedure) Diagnosis Code(s):        --- Professional ---                           R19.5, Other fecal abnormalities                           D50.9, Iron deficiency anemia, unspecified CPT copyright 2016 [employer]. All rights reserved. The codes documented in this report are preliminary and upon coder review may  be revised to meet current compliance requirements. Gatha Mayer, MD 08/26/2017 2:13:30 PM This report has been signed electronically. Number of Addenda: 0

## 2017-08-26 NOTE — Anesthesia Postprocedure Evaluation (Signed)
Anesthesia Post Note  Patient: James Zuniga  Procedure(s) Performed: ESOPHAGOGASTRODUODENOSCOPY (EGD) (N/A ) COLONOSCOPY (N/A )     Patient location during evaluation: PACU Anesthesia Type: MAC Level of consciousness: awake and alert Pain management: pain level controlled Vital Signs Assessment: post-procedure vital signs reviewed and stable Respiratory status: spontaneous breathing, nonlabored ventilation and respiratory function stable Cardiovascular status: stable and blood pressure returned to baseline Postop Assessment: no apparent nausea or vomiting Anesthetic complications: no    Last Vitals:  Vitals:   08/26/17 1405 08/26/17 1420  BP: 101/74 119/79  Pulse: 84 72  Resp: (!) 29 (!) 21  Temp: 36.5 C   SpO2: 99% 100%    Last Pain:  Vitals:   08/26/17 1405  TempSrc: Oral  PainSc:                  Lynda Rainwater

## 2017-08-26 NOTE — Progress Notes (Signed)
Internal Medicine Attending  Date: 08/26/2017  Patient name: James Zuniga Medical record number: [id number] Date of birth: January 12, 1956 Age: 62 y.o. Gender: male  I saw and evaluated the patient. I reviewed the resident's note by Dr. Trilby Drummer and I agree with the resident's findings and plans as documented in his progress note.  When seen on rounds this morning James Zuniga was feeling well. Subsequent EGD and colonoscopy were unremarkable with the exception of a stomach polyp which was not felt to be responsible for his iron deficiency. GI is assessing for celiac disease via a blood test. A capsule endoscopy is not being recommended at this time by gastroenterology. We will provide a dose of IV iron and discharged home on oral iron. Discharge will be today. Follow-up will be with his primary care provider.

## 2017-08-26 NOTE — Anesthesia Preprocedure Evaluation (Addendum)
Anesthesia Evaluation  Patient identified by MRN, date of birth, ID band Patient awake    Reviewed: Allergy & Precautions, NPO status , Patient's Chart, lab work & pertinent test results, reviewed documented beta blocker date and time   Airway Mallampati: II  TM Distance: >3 FB Neck ROM: Full    Dental no notable dental hx. (+) Edentulous Upper, Missing   Pulmonary neg pulmonary ROS, former smoker,    Pulmonary exam normal breath sounds clear to auscultation       Cardiovascular hypertension, Pt. on medications and Pt. on home beta blockers negative cardio ROS Normal cardiovascular exam Rhythm:Regular Rate:Normal     Neuro/Psych CVA, Residual Symptoms negative neurological ROS  negative psych ROS   GI/Hepatic negative GI ROS, Neg liver ROS, GERD  ,  Endo/Other  negative endocrine ROSdiabetes, Type 2, Oral Hypoglycemic Agents  Renal/GU negative Renal ROS  negative genitourinary   Musculoskeletal negative musculoskeletal ROS (+) Arthritis , Osteoarthritis,    Abdominal (+) + obese,   Peds negative pediatric ROS (+)  Hematology negative hematology ROS (+) anemia ,   Anesthesia Other Findings   Reproductive/Obstetrics negative OB ROS                            Anesthesia Physical Anesthesia Plan  ASA: III  Anesthesia Plan: MAC   Post-op Pain Management:    Induction: Intravenous  PONV Risk Score and Plan: 1 and Treatment may vary due to age or medical condition  Airway Management Planned: Nasal Cannula  Additional Equipment:   Intra-op Plan:   Post-operative Plan:   Informed Consent: I have reviewed the patients History and Physical, chart, labs and discussed the procedure including the risks, benefits and alternatives for the proposed anesthesia with the patient or authorized representative who has indicated his/her understanding and acceptance.   Dental advisory given  Plan  Discussed with: CRNA  Anesthesia Plan Comments:         Anesthesia Quick Evaluation

## 2017-08-27 ENCOUNTER — Encounter: Payer: Self-pay | Admitting: Internal Medicine

## 2017-08-27 ENCOUNTER — Encounter (HOSPITAL_COMMUNITY): Payer: Self-pay | Admitting: Internal Medicine

## 2017-08-27 LAB — TISSUE TRANSGLUTAMINASE, IGA

## 2017-08-27 LAB — IGA: IGA: 139 mg/dL (ref 61–437)

## 2017-08-31 DIAGNOSIS — E118 Type 2 diabetes mellitus with unspecified complications: Secondary | ICD-10-CM | POA: Diagnosis not present

## 2017-08-31 DIAGNOSIS — Z09 Encounter for follow-up examination after completed treatment for conditions other than malignant neoplasm: Secondary | ICD-10-CM | POA: Diagnosis not present

## 2017-08-31 DIAGNOSIS — D500 Iron deficiency anemia secondary to blood loss (chronic): Secondary | ICD-10-CM | POA: Diagnosis not present

## 2017-09-01 ENCOUNTER — Encounter: Payer: Self-pay | Admitting: Internal Medicine

## 2017-09-01 DIAGNOSIS — J449 Chronic obstructive pulmonary disease, unspecified: Secondary | ICD-10-CM | POA: Diagnosis not present

## 2017-09-01 NOTE — Progress Notes (Signed)
Letter to patient re: benign gastric polyp and negative celiac test  He should see PCP and stay on ferrous sulfate and be considered for parenteral iron - has needed in past

## 2017-09-02 ENCOUNTER — Encounter: Payer: Self-pay | Admitting: Hematology & Oncology

## 2017-09-07 NOTE — Progress Notes (Signed)
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  In addition he has an admission to the hospital to 06/2018 chronic iron deficiency anemia hemoglobin 8.1 and hyperosmolar nonketotic hyperglycemia with glucose 898.  He is now on insulin.  On follow-up visit today he has not had recurrent stroke or TIA symptoms.  He remains on aspirin and Plavix   given h/o significant cardiac disease for  secondary stroke prevention with minimal bruising and no overt signs of bleeding.  In addition he is on Lipitor without myalgias.  Blood pressure in the office today 117/79.  He continues to ambulate with a cane due to chronic knee pain.  No falls.  He returns for reevaluation  03/08/17 James Zuniga is a 62 year old Caucasian male seen today for first office follow-up visit following hospital admission for stroke in July 2018. History is obtained from the patient, sister, review of electronic medical records and imaging films which have personally reviewed.James Zuniga an 62 y.o.male with a history of CHF, CAD, COPD, diabetes mellitus, hypertension, and peripheral vascular disease who presented 12/22/2016 for acute onset LUE incoordination and weakness. He woke up at about 0130 and noticed that he was having difficulty using his left hand to adjust his CPAP mask. He then went back to bed. When his wife arrived home later this morning (she is a CNA), she noted that he was not quite right and took him to his PCP, who also noted a deficit and had him  transported emergently to the ED. Code Stroke was called by EMS en route when patient began having difficulty seeing and with his speech. LKN was 0005 on 12/22/2016. Patient was not administered IV t-PA due to arriving outside of the treatment window. Hewas admitted to General Neurology for further evaluation and treatment. CT scan of the head on admission was unremarkable and CT angiogram was negative for emergent large vessel occlusion. CT perfusion study was degraded by motion artifact. MRI scan of the brain which I have personally reviewed showed an acute small right frontoparietal MCA branch infarct with moderate changes of chronic small vessel disease. Transthoracic echo showed normal ejection fraction. Transesophageal echocardiogram showed no cardiac source of embolism. There were a few late bubbles in the left atrium consistent with a small intrapulmonary shunt which was felt to be clinically not significant. Left ventricular ejection fraction was found to be severely depressed and it was felt patient needed outpatient cardiology consultation and follow-up hence loop recorder was not placed. The patient states she has seen cardiologist in [facility] and has done a 30 day external heart monitor but the results of which are yet pending. It is unclear whether the patient needs a defibrillator or not at this point. LDL cholesterol was 37 mg percent. Hemoglobin A1c was 6.6. The patient has obtained  significant improvement in left-sided strength though fine motor skills are still diminished on the left. He also has chronic left knee pain which limits  ambulation and  has to use a cane for long distances.  REVIEW OF SYSTEMS: Full 14 system review of systems performed and  notable only for those listed, all others are neg:  Constitutional: neg  Cardiovascular: neg Ear/Nose/Throat: neg  Skin: neg Eyes: Blurred vision Respiratory: neg Gastroitestinal: neg  Hematology/Lymphatic: neg  Endocrine: Excessive  thirst Musculoskeletal:neg Allergy/Immunology: neg Neurological: neg Psychiatric: neg Sleep : Obstructive sleep apnea on CPAP   ALLERGIES: No Known Allergies  HOME MEDICATIONS: Outpatient Medications Prior to Visit  Medication Sig Dispense Refill  . albuterol (PROVENTIL) (2.5 MG/3ML) 0.083% nebulizer solution Take 2.5 mg by nebulization every 6 (six) hours as needed for wheezing or shortness of breath.    . allopurinol (ZYLOPRIM) 300 MG tablet Take 300 mg by mouth daily.    Marland Kitchen amLODipine (NORVASC) 5 MG tablet Take 5 mg by mouth daily.     Marland Kitchen aspirin 81 MG EC tablet Take 81 mg by mouth daily. Swallow whole.    Marland Kitchen atorvastatin (LIPITOR) 80 MG tablet Take 1 tablet (80 mg total) by mouth every morning. 30 tablet 0  . clopidogrel (PLAVIX) 75 MG tablet Take 1 tablet (75 mg total) by mouth daily. 30 tablet 2  . DULoxetine (CYMBALTA) 60 MG capsule Take 60 mg by mouth daily.    . ferrous sulfate 325 (65 FE) MG tablet Take 1 tablet (325 mg total) by mouth 3 (three) times daily with meals. 90 tablet 3  . furosemide (LASIX) 20 MG tablet Take 20 mg by mouth 2 (two) times a week.     . gabapentin (NEURONTIN) 300 MG capsule Take 300 mg by mouth 4 (four) times daily.     . insulin NPH-regular Human (NOVOLIN 70/30) (70-30) 100 UNIT/ML injection Please inject 25 units twice daily before breakfast and dinner. (Patient taking differently: 30 in the AM, 25 at dinner) 10 mL 3  . ipratropium-albuterol (DUONEB) 0.5-2.5 (3) MG/3ML SOLN Take 3 mLs by nebulization every 6 (six) hours as needed. DX: J44.9 360 mL 5  . losartan (COZAAR) 100 MG tablet Take 100 mg by mouth daily.     . metoprolol succinate (TOPROL-XL) 25 MG 24 hr tablet Take 1 tablet (25 mg total) by mouth daily. (Patient taking differently: Take 25 mg by mouth 2 (two) times daily. ) 60 tablet 2  . OXYGEN Inhale 2 L into the lungs as needed.    . pantoprazole (PROTONIX) 20 MG tablet Take 1 tablet by mouth daily.    . potassium chloride SA  (K-DUR,KLOR-CON) 20 MEQ tablet Take 1 tablet (20 mEq total) by mouth 2 (two) times daily. 30 tablet 3  . primidone (MYSOLINE) 50 MG tablet Take 100 mg by mouth at bedtime.     . tamsulosin (FLOMAX) 0.4 MG CAPS capsule Take 1 capsule (0.4 mg total) by mouth daily after breakfast. 30 capsule 12  . tiZANidine (ZANAFLEX) 4 MG tablet 4 mg 3 (three) times daily as needed for muscle spasms.   0  . traMADol (ULTRAM) 50 MG tablet Take 50 mg by mouth daily as needed for moderate pain.     Marland Kitchen zolpidem (AMBIEN) 10 MG tablet Take 10 mg by mouth at bedtime.     Marland Kitchen omeprazole (PRILOSEC) 20 MG capsule Take 20 mg by mouth daily.   0   No facility-administered medications prior to visit.     PAST MEDICAL HISTORY: Past Medical History:  Diagnosis Date  . Arthritis    "all over" (08/24/2017)  . CHF (congestive heart failure) ([place])   . Chronic airway obstruction ([place])   . Chronic lower back pain   . Coronary artery disease   . Disorder  of liver    "spots on my liver; they've rechecked them; they haven't changed" (08/24/2017)  . Esophageal reflux   . Gout    "on daily RX" (08/24/2017)  . Hyperlipidemia   . Hypertension   . Hypokalemia 11/2016  . Hypotestosteronism 07/01/2013  . Insomnia   . Iron deficiency anemia, unspecified 07/01/2013  . Memory loss   . On home oxygen therapy    "2L prn" (08/24/2017)  . OSA on CPAP   . PVD (peripheral vascular disease) ([place])   . Renal disorder   . Stroke [facility]) 01/2017   "left sided weakness remains" (08/24/2017)  . Type II diabetes mellitus ([place])   . Vitamin D deficiency     PAST SURGICAL HISTORY: Past Surgical History:  Procedure Laterality Date  . COLONOSCOPY N/A 08/26/2017   Procedure: COLONOSCOPY;  Surgeon: Gatha Mayer, MD;  Location: [facility] ENDOSCOPY;  Service: Endoscopy;  Laterality: N/A;  . ESOPHAGOGASTRODUODENOSCOPY N/A 08/26/2017   Procedure: ESOPHAGOGASTRODUODENOSCOPY (EGD);  Surgeon: Gatha Mayer, MD;  Location: [facility] ENDOSCOPY;  Service: Endoscopy;   Laterality: N/A;  . KNEE ARTHROSCOPY Left   . LOOP RECORDER IMPLANT  06/2017  . TEE WITHOUT CARDIOVERSION N/A 12/24/2016   Procedure: TRANSESOPHAGEAL ECHOCARDIOGRAM (TEE);  Surgeon: Fay Records, MD;  Location: [facility] ENDOSCOPY;  Service: Cardiovascular;  Laterality: N/A;    FAMILY HISTORY: Family History  Problem Relation Age of Onset  . Heart disease Mother   . Diabetes Father   . Stroke Brother   . Stroke Paternal Uncle   . Diabetes Sister   . Heart attack Brother   . Heart attack Brother     SOCIAL HISTORY: Social History   Socioeconomic History  . Marital status: Married    Spouse name: Not on file  . Number of children: Not on file  . Years of education: Not on file  . Highest education level: Not on file  Social Needs  . Financial resource strain: Not on file  . Food insecurity - worry: Not on file  . Food insecurity - inability: Not on file  . Transportation needs - medical: Not on file  . Transportation needs - non-medical: Not on file  Occupational History  . Occupation: diabled    Comment: as of early 93s  Tobacco Use  . Smoking status: Former Smoker    Packs/day: 0.50    Years: 40.00    Pack years: 20.00    Types: Cigarettes    Start date: 08/05/1983    Last attempt to quit: 08/15/2017    Years since quitting: 0.0  . Smokeless tobacco: Never Used  Substance and Sexual Activity  . Alcohol use: No    Alcohol/week: 0.0 oz  . Drug use: No  . Sexual activity: Not Currently  Other Topics Concern  . Not on file  Social History Narrative  . Not on file     PHYSICAL EXAM  Vitals:   09/08/17 0826  BP: 117/79  Pulse: 80  Weight: 218 lb 3.2 oz (99 kg)  Height: 5\' 6"  (1.676 m)   Body mass index is 35.22 kg/m.  Generalized: Well developed, obese male in no acute distress  Head: normocephalic and atraumatic,. Oropharynx benign  Neck: Supple, no carotid bruits  Cardiac: Regular rate rhythm, no murmur  Musculoskeletal: No deformity   Neurological  examination   Mentation: Alert oriented to time, place, history taking. Attention span and concentration appropriate. Recent and remote memory intact.  Follows all commands speech and language fluent.  Cranial nerve II-XII: Pupils were equal round reactive to light extraocular movements were full, visual field were full on confrontational test. Facial sensation and strength were normal. hearing was intact to finger rubbing bilaterally. Uvula tongue midline. head turning and shoulder shrug were normal and symmetric.Tongue protrusion into cheek strength was normal. Motor: normal bulk and tone, full strength in the BUE, BLE, fine finger movements normal,  Sensory: normal and symmetric to light touch, pinprick, and  Vibration in the upper and lower extremities,   Coordination: finger-nose-finger, heel-to-shin bilaterally, no dysmetria Reflexes: 1+ upper lower and symmetric plantar responses were flexor bilaterally. Gait and Station: Rising up from seated position without assistance, normal stance, favors left knee due to pain, ambulates with single-point cane .Able to perform tiptoe, and heel walking without difficulty. Tandem gait is steady  DIAGNOSTIC DATA (LABS, IMAGING, TESTING) - I reviewed patient records, labs, notes, testing and imaging myself where available.  Lab Results  Component Value Date   WBC 8.4 08/26/2017   HGB 8.8 (L) 08/26/2017   HCT 29.7 (L) 08/26/2017   MCV 79.0 08/26/2017   PLT 342 08/26/2017      Component Value Date/Time   NA 141 08/26/2017 0446   NA 141 09/25/2016 0809   K 3.2 (L) 08/26/2017 0446   K 2.8 (LL) 09/25/2016 0809   CL 105 08/26/2017 0446   CL 102 07/31/2016 0815   CO2 25 08/26/2017 0446   CO2 28 09/25/2016 0809   GLUCOSE 122 (H) 08/26/2017 0446   GLUCOSE 116 09/25/2016 0809   GLUCOSE 128 (H) 07/31/2016 0815   BUN 6 08/26/2017 0446   BUN 13.1 09/25/2016 0809   CREATININE 1.12 08/26/2017 0446   CREATININE 1.2 09/25/2016 0809   CALCIUM 9.3  08/26/2017 0446   CALCIUM 9.5 09/25/2016 0809   PROT 7.2 08/24/2017 0935   PROT 7.8 09/25/2016 0809   ALBUMIN 3.2 (L) 08/24/2017 0935   ALBUMIN 3.6 09/25/2016 0809   AST 18 08/24/2017 0935   AST 19 09/25/2016 0809   ALT 15 (L) 08/24/2017 0935   ALT 23 09/25/2016 0809   ALKPHOS 189 (H) 08/24/2017 0935   ALKPHOS 126 09/25/2016 0809   BILITOT 0.4 08/24/2017 0935   BILITOT 0.45 09/25/2016 0809   GFRNONAA >60 08/26/2017 0446   GFRAA >60 08/26/2017 0446   Lab Results  Component Value Date   CHOL 90 12/24/2016   HDL 25 (L) 12/24/2016   LDLCALC 37 12/24/2016   TRIG 140 12/24/2016   CHOLHDL 3.6 12/24/2016   Lab Results  Component Value Date   HGBA1C 6.6 (H) 12/23/2016    Lab Results  Component Value Date   TSH 0.356 11/12/2016      ASSESSMENT AND PLAN 62 year old male with  right frontal MCA branch cryptogenic stroke in July 2018 with vascular risk factors of hyperlipidemia, hypertension, cardiomyopathy, diabetes and obstructive sleep apnea with CPAP.Admission to the hospital to 08/24/2017 chronic iron deficiency anemia hemoglobin 8.1 and hyperosmolar nonketotic hyperglycemia with glucose 898.  He is now on insulin.   PLAN: Stressed the importance of management of risk factors to prevent further stroke Continue Plavix and aspirin for secondary stroke prevention and history of significant cardiac disease Maintain strict control of hypertension with blood pressure goal below 130/90, today's reading 117/79 continue antihypertensive medications Control of diabetes with hemoglobin A1c below 6.5 followed by primary care  continue diabetic medications Cholesterol with LDL cholesterol less than 70, followed by primary care,   continue statin drug Lipitor Exercise by walking,  30  minutes  Daily  eat healthy diet with whole grains,  fresh fruits and vegetables Discharge from stroke services I spent 25 minutes in total face to face time with the patient more than 50% of which was spent  counseling and coordination of care, reviewing test results reviewing medications and discussing and reviewing the diagnosis of stroke and management of risk factors.  Written information given to patient and reviewed as well Dennie Bible, [facility], [facility], APRN  [facility] [address], [place], [place] [address] [phone]

## 2017-09-08 ENCOUNTER — Encounter (INDEPENDENT_AMBULATORY_CARE_PROVIDER_SITE_OTHER): Payer: Self-pay

## 2017-09-08 ENCOUNTER — Encounter: Payer: Self-pay | Admitting: Nurse Practitioner

## 2017-09-08 ENCOUNTER — Ambulatory Visit: Payer: PPO | Admitting: Nurse Practitioner

## 2017-09-08 VITALS — BP 117/79 | HR 80 | Ht 66.0 in | Wt 218.2 lb

## 2017-09-08 DIAGNOSIS — E785 Hyperlipidemia, unspecified: Secondary | ICD-10-CM | POA: Diagnosis not present

## 2017-09-08 DIAGNOSIS — Z9181 History of falling: Secondary | ICD-10-CM | POA: Diagnosis not present

## 2017-09-08 DIAGNOSIS — I10 Essential (primary) hypertension: Secondary | ICD-10-CM | POA: Diagnosis not present

## 2017-09-08 DIAGNOSIS — I6389 Other cerebral infarction: Secondary | ICD-10-CM | POA: Diagnosis not present

## 2017-09-08 NOTE — Progress Notes (Signed)
I agree with the above plan 

## 2017-09-08 NOTE — Patient Instructions (Addendum)
Stressed the importance of management of risk factors to prevent further stroke Continue Plavix and aspirin for secondary stroke prevention Maintain strict control of hypertension with blood pressure goal below 130/90, today's reading 117/79 continue antihypertensive medications Control of diabetes with hemoglobin A1c below 6.5 followed by primary care  continue diabetic medications Cholesterol with LDL cholesterol less than 70, followed by primary care,   continue statin drug Lipitor Exercise by walking,  30 minutes  Daily  eat healthy diet with whole grains,  fresh fruits and vegetables Discharge from stroke services  Stroke Prevention Some health problems and behaviors may make it more likely for you to have a stroke. Below are ways to lessen your risk of having a stroke.  Be active for at least 30 minutes on most or all days.  Do not smoke. Try not to be around others who smoke.  Do not drink too much alcohol. ? Do not have more than 2 drinks a day if you are a man. ? Do not have more than 1 drink a day if you are a woman and are not pregnant.  Eat healthy foods, such as fruits and vegetables. If you were put on a specific diet, follow the diet as told.  Keep your cholesterol levels under control through diet and medicines. Look for foods that are low in saturated fat, trans fat, cholesterol, and are high in fiber.  If you have diabetes, follow all diet plans and take your medicine as told.  Ask your doctor if you need treatment to lower your blood pressure. If you have high blood pressure (hypertension), follow all diet plans and take your medicine as told by your doctor.  If you are 33-44 years old, have your blood pressure checked every 3-5 years. If you are age 42 or older, have your blood pressure checked every year.  Keep a healthy weight. Eat foods that are low in calories, salt, saturated fat, trans fat, and cholesterol.  Do not take drugs.  Avoid birth control pills, if  this applies. Talk to your doctor about the risks of taking birth control pills.  Talk to your doctor if you have sleep problems (sleep apnea).  Take all medicine as told by your doctor. ? You may be told to take aspirin or blood thinner medicine. Take this medicine as told by your doctor. ? Understand your medicine instructions.  Make sure any other conditions you have are being taken care of.  Get help right away if:  You suddenly lose feeling (you feel numb) or have weakness in your face, arm, or leg.  Your face or eyelid hangs down to one side.  You suddenly feel confused.  You have trouble talking (aphasia) or understanding what people are saying.  You suddenly have trouble seeing in one or both eyes.  You suddenly have trouble walking.  You are dizzy.  You lose your balance or your movements are clumsy (uncoordinated).  You suddenly have a very bad headache and you do not know the cause.  You have new chest pain.  Your heart feels like it is fluttering or skipping a beat (irregular heartbeat). Do not wait to see if the symptoms above go away. Get help right away. Call your local emergency services (911 in [place] Do not drive yourself to the hospital. This information is not intended to replace advice given to you by your health care provider. Make sure you discuss any questions you have with your health care provider. Document Released: 12/29/2011  Document Revised: 12/05/2015 Document Reviewed: 12/30/2012 Elsevier Interactive Patient Education  [employer].

## 2017-09-10 ENCOUNTER — Ambulatory Visit: Payer: Self-pay | Admitting: Physician Assistant

## 2017-09-13 ENCOUNTER — Encounter: Payer: Self-pay | Admitting: Family

## 2017-09-13 ENCOUNTER — Inpatient Hospital Stay: Payer: PPO

## 2017-09-13 ENCOUNTER — Other Ambulatory Visit: Payer: Self-pay

## 2017-09-13 ENCOUNTER — Other Ambulatory Visit: Payer: Self-pay | Admitting: *Deleted

## 2017-09-13 ENCOUNTER — Inpatient Hospital Stay: Payer: PPO | Attending: Family | Admitting: Family

## 2017-09-13 VITALS — BP 112/74 | HR 74 | Temp 97.5°F | Resp 16 | Wt 219.0 lb

## 2017-09-13 DIAGNOSIS — D631 Anemia in chronic kidney disease: Secondary | ICD-10-CM | POA: Diagnosis not present

## 2017-09-13 DIAGNOSIS — E1165 Type 2 diabetes mellitus with hyperglycemia: Secondary | ICD-10-CM | POA: Diagnosis not present

## 2017-09-13 DIAGNOSIS — E349 Endocrine disorder, unspecified: Secondary | ICD-10-CM

## 2017-09-13 DIAGNOSIS — Z8673 Personal history of transient ischemic attack (TIA), and cerebral infarction without residual deficits: Secondary | ICD-10-CM | POA: Diagnosis not present

## 2017-09-13 DIAGNOSIS — K769 Liver disease, unspecified: Secondary | ICD-10-CM | POA: Insufficient documentation

## 2017-09-13 DIAGNOSIS — I5022 Chronic systolic (congestive) heart failure: Secondary | ICD-10-CM

## 2017-09-13 DIAGNOSIS — D500 Iron deficiency anemia secondary to blood loss (chronic): Secondary | ICD-10-CM

## 2017-09-13 DIAGNOSIS — Z794 Long term (current) use of insulin: Secondary | ICD-10-CM | POA: Diagnosis not present

## 2017-09-13 DIAGNOSIS — N189 Chronic kidney disease, unspecified: Secondary | ICD-10-CM | POA: Insufficient documentation

## 2017-09-13 DIAGNOSIS — D509 Iron deficiency anemia, unspecified: Secondary | ICD-10-CM

## 2017-09-13 DIAGNOSIS — D508 Other iron deficiency anemias: Secondary | ICD-10-CM

## 2017-09-13 LAB — ABO/RH: ABO/RH(D): O POS

## 2017-09-13 LAB — SAMPLE TO BLOOD BANK

## 2017-09-13 LAB — FERRITIN: Ferritin: 78 ng/mL (ref 22–316)

## 2017-09-13 LAB — CBC WITH DIFFERENTIAL (CANCER CENTER ONLY)
Basophils Absolute: 0 10*3/uL (ref 0.0–0.1)
Basophils Relative: 1 %
EOS PCT: 4 %
Eosinophils Absolute: 0.2 10*3/uL (ref 0.0–0.5)
HCT: 25.2 % — ABNORMAL LOW (ref 38.7–49.9)
Hemoglobin: 7.4 g/dL — ABNORMAL LOW (ref 13.0–17.1)
LYMPHS ABS: 1.7 10*3/uL (ref 0.9–3.3)
Lymphocytes Relative: 30 %
MCH: 26 pg — AB (ref 28.0–33.4)
MCHC: 29.4 g/dL — AB (ref 32.0–35.9)
MCV: 88.4 fL (ref 82.0–98.0)
MONO ABS: 0.3 10*3/uL (ref 0.1–0.9)
MONOS PCT: 5 %
Neutro Abs: 3.4 10*3/uL (ref 1.5–6.5)
Neutrophils Relative %: 60 %
PLATELETS: 340 10*3/uL (ref 145–400)
RBC: 2.85 MIL/uL — ABNORMAL LOW (ref 4.20–5.70)
RDW: 19.5 % — AB (ref 11.1–15.7)
WBC Count: 5.7 10*3/uL (ref 4.0–10.0)

## 2017-09-13 LAB — CMP (CANCER CENTER ONLY)
ALT: 20 U/L (ref 10–47)
AST: 26 U/L (ref 11–38)
Albumin: 2.9 g/dL — ABNORMAL LOW (ref 3.5–5.0)
Alkaline Phosphatase: 108 U/L — ABNORMAL HIGH (ref 26–84)
Anion gap: 11 (ref 5–15)
BUN: 13 mg/dL (ref 7–22)
CHLORIDE: 103 mmol/L (ref 98–108)
CO2: 28 mmol/L (ref 18–33)
Calcium: 9.8 mg/dL (ref 8.0–10.3)
Creatinine: 1.1 mg/dL (ref 0.60–1.20)
GLUCOSE: 208 mg/dL — AB (ref 73–118)
POTASSIUM: 3.2 mmol/L — AB (ref 3.3–4.7)
SODIUM: 142 mmol/L (ref 128–145)
Total Bilirubin: 0.4 mg/dL (ref 0.2–1.6)
Total Protein: 6.7 g/dL (ref 6.4–8.1)

## 2017-09-13 LAB — IRON AND TIBC
Iron: 58 ug/dL (ref 42–163)
Saturation Ratios: 21 % — ABNORMAL LOW (ref 42–163)
TIBC: 278 ug/dL (ref 202–409)
UIBC: 219 ug/dL

## 2017-09-13 LAB — PREPARE RBC (CROSSMATCH)

## 2017-09-13 NOTE — Progress Notes (Signed)
Hematology and Oncology Follow Up Visit  James Zuniga [id number] 03-29-1956 62 y.o. 09/13/2017   Principle Diagnosis:  Iron deficiency anemia Hypotestosteronemia Chronic liver lesions  Current Therapy:   IV iron as indicated   Interim History:  James Zuniga is here today with his wife for follow-up. He was hospitalized 2 weeks ago with uncontrolled blood sugars and heme + stool with anemia. He was given IV iron during admission and is now on insulin. His wife is checking his blood glucose 2-3 times a day and his numbers are improving.  His vision has been blurry due to the uncontrolled glucose. He follows up with his eye doctor again in May.  His endoscopy and colonoscopy showed no evidence of bleed. He had one benign polyp removed.  He is symptomatic with fatigue, weakness, palpitations and SOB with exertion.  He had no fever, chills, n/v, cough, rash, chest pain, abdominal pain or changes in bowel or bladder habits.  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  He now has a loop recorder implant for further monitoring. He is on Plavix and aspirin daily. He has stopped all testosterone supplementation.  No other bleeding, no bruising or petechiae.  He has intermittent numbness or tingling in his lower extremities due to chronic back issues. No swelling or tenderness in his extremities.  He has a good appetite and is now on a healthier diet to maintain his blood sugar. He is staying hydrated. His weight is stable.   ECOG Performance Status: 1 - Symptomatic but completely ambulatory  Medications:  Allergies as of 09/13/2017   No Known Allergies     Medication List        Accurate as of 09/13/17  8:45 AM. Always use your most recent med list.          albuterol (2.5 MG/3ML) 0.083% nebulizer solution Commonly known as:  PROVENTIL Take 2.5 mg by nebulization every 6 (six) hours as needed for wheezing or shortness of breath.   allopurinol 300  MG tablet Commonly known as:  ZYLOPRIM Take 300 mg by mouth daily.   amLODipine 5 MG tablet Commonly known as:  NORVASC Take 5 mg by mouth daily.   aspirin 81 MG EC tablet Take 81 mg by mouth daily. Swallow whole.   atorvastatin 80 MG tablet Commonly known as:  LIPITOR Take 1 tablet (80 mg total) by mouth every morning.   clopidogrel 75 MG tablet Commonly known as:  PLAVIX Take 1 tablet (75 mg total) by mouth daily.   DULoxetine 60 MG capsule Commonly known as:  CYMBALTA Take 60 mg by mouth daily.   ferrous sulfate 325 (65 FE) MG tablet Take 1 tablet (325 mg total) by mouth 3 (three) times daily with meals.   furosemide 20 MG tablet Commonly known as:  LASIX Take 20 mg by mouth 2 (two) times a week.   gabapentin 300 MG capsule Commonly known as:  NEURONTIN Take 300 mg by mouth 4 (four) times daily.   insulin NPH-regular Human (70-30) 100 UNIT/ML injection Commonly known as:  NOVOLIN 70/30 Please inject 25 units twice daily before breakfast and dinner.   ipratropium-albuterol 0.5-2.5 (3) MG/3ML Soln Commonly known as:  DUONEB Take 3 mLs by nebulization every 6 (six) hours as needed. DX: J44.9   losartan 100 MG tablet Commonly known as:  COZAAR Take 100 mg by mouth daily.   metoprolol succinate 25 MG 24 hr tablet Commonly known as:  TOPROL-XL Take 1 tablet (25 mg total) by mouth daily.   OXYGEN Inhale 2 L into the lungs as needed.   pantoprazole 20 MG tablet Commonly known as:  PROTONIX Take 1 tablet by mouth daily.   potassium chloride SA 20 MEQ tablet Commonly known as:  K-DUR,KLOR-CON Take 1 tablet (20 mEq total) by mouth 2 (two) times daily.   primidone 50 MG tablet Commonly known as:  MYSOLINE Take 100 mg by mouth at bedtime.   tamsulosin 0.4 MG Caps capsule Commonly known as:  FLOMAX Take 1 capsule (0.4 mg total) by mouth daily after breakfast.   tiZANidine 4 MG tablet Commonly known as:  ZANAFLEX 4 mg 3 (three) times daily as needed for  muscle spasms.   traMADol 50 MG tablet Commonly known as:  ULTRAM Take 50 mg by mouth daily as needed for moderate pain.   zolpidem 10 MG tablet Commonly known as:  AMBIEN Take 10 mg by mouth at bedtime.       Allergies: No Known Allergies  Past Medical History, Surgical history, Social history, and Family History were reviewed and updated.  Review of Systems: All other 10 point review of systems is negative.   Physical Exam:  vitals were not taken for this visit.   Wt Readings from Last 3 Encounters:  09/08/17 218 lb 3.2 oz (99 kg)  08/24/17 211 lb (95.7 kg)  08/04/17 215 lb 9.6 oz (97.8 kg)    Ocular: Sclerae unicteric, pupils equal, round and reactive to light Ear-nose-throat: Oropharynx clear, dentition fair Lymphatic: No cervical, supraclavicular or axillary adenopathy Lungs no rales or rhonchi, good excursion bilaterally Heart regular rate and rhythm, no murmur appreciated Abd soft, nontender, positive bowel sounds, no liver or spleen tip palpated on exam, no fluid wave  MSK no focal spinal tenderness, no joint edema Neuro: non-focal, well-oriented, appropriate affect Breasts: Deferred   Lab Results  Component Value Date   WBC 5.7 09/13/2017   HGB 8.8 (L) 08/26/2017   HCT 25.2 (L) 09/13/2017   MCV 88.4 09/13/2017   PLT 340 09/13/2017   Lab Results  Component Value Date   FERRITIN 8 (L) 08/24/2017   IRON 20 (L) 08/24/2017   TIBC 378 08/24/2017   UIBC 358 08/24/2017   IRONPCTSAT 5 (L) 08/24/2017   Lab Results  Component Value Date   RETICCTPCT 1.2 06/20/2015   RBC 2.85 (L) 09/13/2017   RETICCTABS 63.0 06/20/2015   No results found for: Nils Pyle [facility] Lab Results  Component Value Date   IGA 139 08/26/2017   No results found for: Odetta Pink, SPEI   Chemistry      Component Value Date/Time   NA 141 08/26/2017 0446   NA 141 09/25/2016 0809   K 3.2 (L) 08/26/2017 0446     K 2.8 (LL) 09/25/2016 0809   CL 105 08/26/2017 0446   CL 102 07/31/2016 0815   CO2 25 08/26/2017 0446   CO2 28 09/25/2016 0809   BUN 6 08/26/2017 0446   BUN 13.1 09/25/2016 0809   CREATININE 1.12 08/26/2017 0446   CREATININE 1.2 09/25/2016 0809      Component Value Date/Time   CALCIUM 9.3 08/26/2017 0446   CALCIUM 9.5 09/25/2016 0809   ALKPHOS 189 (H) 08/24/2017 0935   ALKPHOS 126 09/25/2016 0809   AST 18 08/24/2017 0935   AST 19 09/25/2016 0809   ALT 15 (L) 08/24/2017 0935   ALT 23 09/25/2016 0809   BILITOT 0.4  08/24/2017 0935   BILITOT 0.45 09/25/2016 0809      Impression and Plan: Mr. 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 He has had a cryptogenic stroke since we last saw him and is off testosterone therapy. He was hospitalized 2 weeks ago with uncontrolled blood sugars and anemia.   His Hgb is now 7.4 and he is symptomatic with fatigue, weakness, palpitations and SOB with exertion. He is not in any visible distress at this time.  We will give him 2 units of blood tomorrow. He may also need IV iron as well. We will see what his counts show.  We will go ahead and plan to see him back in another month for follow-up.  We can certainly see him sooner if need be. He will contact our office with any questions or concerns.   Laverna Peace, NP 3/4/20198:45 AM

## 2017-09-14 ENCOUNTER — Other Ambulatory Visit: Payer: Self-pay | Admitting: Family

## 2017-09-14 ENCOUNTER — Inpatient Hospital Stay: Payer: PPO

## 2017-09-14 VITALS — BP 103/73 | HR 65 | Temp 97.7°F | Resp 16

## 2017-09-14 DIAGNOSIS — D509 Iron deficiency anemia, unspecified: Secondary | ICD-10-CM | POA: Diagnosis not present

## 2017-09-14 DIAGNOSIS — D508 Other iron deficiency anemias: Secondary | ICD-10-CM

## 2017-09-14 LAB — TESTOSTERONE: Testosterone: 148 ng/dL — ABNORMAL LOW (ref 264–916)

## 2017-09-14 LAB — ERYTHROPOIETIN: Erythropoietin: 63.9 m[IU]/mL — ABNORMAL HIGH (ref 2.6–18.5)

## 2017-09-14 MED ORDER — SODIUM CHLORIDE 0.9 % IV SOLN: 250.0000 mL | Freq: Once | INTRAVENOUS | Status: DC

## 2017-09-14 MED ORDER — SODIUM CHLORIDE 0.9 % IV SOLN
510.0000 mg | Freq: Once | INTRAVENOUS | Status: AC
  Administered 2017-09-14: 510 mg via INTRAVENOUS
  Filled 2017-09-14: qty 17

## 2017-09-14 MED ORDER — FUROSEMIDE 10 MG/ML IJ SOLN
INTRAMUSCULAR | Status: AC
  Filled 2017-09-14: qty 4

## 2017-09-14 MED ORDER — FUROSEMIDE 10 MG/ML IJ SOLN
20.0000 mg | Freq: Once | INTRAMUSCULAR | Status: AC
  Administered 2017-09-14: 20 mg via INTRAVENOUS

## 2017-09-14 MED ORDER — DIPHENHYDRAMINE HCL 25 MG PO CAPS
ORAL_CAPSULE | ORAL | Status: AC
  Filled 2017-09-14: qty 1

## 2017-09-14 MED ORDER — ACETAMINOPHEN 325 MG PO TABS
ORAL_TABLET | ORAL | Status: AC
  Filled 2017-09-14: qty 2

## 2017-09-14 MED ORDER — DIPHENHYDRAMINE HCL 25 MG PO CAPS
25.0000 mg | ORAL_CAPSULE | Freq: Once | ORAL | Status: AC
  Administered 2017-09-14: 25 mg via ORAL

## 2017-09-14 MED ORDER — SODIUM CHLORIDE 0.9 % IV SOLN
Freq: Once | INTRAVENOUS | Status: AC
  Administered 2017-09-14: 10:00:00 via INTRAVENOUS

## 2017-09-14 MED ORDER — ACETAMINOPHEN 325 MG PO TABS
650.0000 mg | ORAL_TABLET | Freq: Once | ORAL | Status: AC
  Administered 2017-09-14: 650 mg via ORAL

## 2017-09-14 NOTE — Patient Instructions (Addendum)
Blood Transfusion, Adult A blood transfusion is a procedure in which you receive donated blood, including plasma, platelets, and red blood cells, through an IV tube. You may need a blood transfusion because of illness, surgery, or injury. The blood may come from a donor. You may also be able to donate blood for yourself (autologous blood donation) before a surgery if you know that you might require a blood transfusion. The blood given in a transfusion is made up of different types of cells. You may receive:  Red blood cells. These carry oxygen to the cells in the body.  White blood cells. These help you fight infections.  Platelets. These help your blood to clot.  Plasma. This is the liquid part of your blood and it helps with fluid imbalances.  If you have hemophilia or another clotting disorder, you may also receive other types of blood products. Tell a health care provider about:  Any allergies you have.  All medicines you are taking, including vitamins, herbs, eye drops, creams, and over-the-counter medicines.  Any problems you or family members have had with anesthetic medicines.  Any blood disorders you have.  Any surgeries you have had.  Any medical conditions you have, including any recent fever or cold symptoms.  Whether you are pregnant or may be pregnant.  Any previous reactions you have had during a blood transfusion. What are the risks? Generally, this is a safe procedure. However, problems may occur, including:  Having an allergic reaction to something in the donated blood. Hives and itching may be symptoms of this type of reaction.  Fever. This may be a reaction to the white blood cells in the transfused blood. Nausea or chest pain may accompany a fever.  Iron overload. This can happen from having many transfusions.  Transfusion-related acute lung injury (TRALI). This is a rare reaction that causes lung damage. The cause is not known.TRALI can occur within hours  of a transfusion or several days later.  Sudden (acute) or delayed hemolytic reactions. This happens if your blood does not match the cells in your transfusion. Your body's defense system (immune system) may try to attack the new cells. This complication is rare. The symptoms include fever, chills, nausea, and low back pain or chest pain.  Infection or disease transmission. This is rare.  What happens before the procedure?  You will have a blood test to determine your blood type. This is necessary to know what kind of blood your body will accept and to match it to the donor blood.  If you are going to have a planned surgery, you may be able to do an autologous blood donation. This may be done in case you need to have a transfusion.  If you have had an allergic reaction to a transfusion in the past, you may be given medicine to help prevent a reaction. This medicine may be given to you by mouth or through an IV tube.  You will have your temperature, blood pressure, and pulse monitored before the transfusion.  Follow instructions from your health care provider about eating and drinking restrictions.  Ask your health care provider about: ? Changing or stopping your regular medicines. This is especially important if you are taking diabetes medicines or blood thinners. ? Taking medicines such as aspirin and ibuprofen. These medicines can thin your blood. Do not take these medicines before your procedure if your health care provider instructs you not to. What happens during the procedure?  An IV tube will   be inserted into one of your veins.  The bag of donated blood will be attached to your IV tube. The blood will then enter through your vein.  Your temperature, blood pressure, and pulse will be monitored regularly during the transfusion. This monitoring is done to detect early signs of a transfusion reaction.  If you have any signs or symptoms of a reaction, your transfusion will be stopped  and you may be given medicine.  When the transfusion is complete, your IV tube will be removed.  Pressure may be applied to the IV site for a few minutes.  A bandage (dressing) will be applied. The procedure may vary among health care providers and hospitals. What happens after the procedure?  Your temperature, blood pressure, heart rate, breathing rate, and blood oxygen level will be monitored often.  Your blood may be tested to see how you are responding to the transfusion.  You may be warmed with fluids or blankets to maintain a normal body temperature. Summary  A blood transfusion is a procedure in which you receive donated blood, including plasma, platelets, and red blood cells, through an IV tube.  Your temperature, blood pressure, and pulse will be monitored before, during, and after the transfusion.  Your blood may be tested after the transfusion to see how your body has responded. This information is not intended to replace advice given to you by your health care provider. Make sure you discuss any questions you have with your health care provider. Document Released: 06/26/2000 Document Revised: 03/26/2016 Document Reviewed: 03/26/2016 Elsevier Interactive Patient Education  2018 [place] injection What is this medicine? FERUMOXYTOL is an iron complex. Iron is used to make healthy red blood cells, which carry oxygen and nutrients throughout the body. This medicine is used to treat iron deficiency anemia in people with chronic kidney disease. This medicine may be used for other purposes; ask your health care provider or pharmacist if you have questions. COMMON BRAND NAME(S): Feraheme What should I tell my health care provider before I take this medicine? They need to know if you have any of these conditions: -anemia not caused by low iron levels -high levels of iron in the blood -magnetic resonance imaging (MRI) test scheduled -an unusual or allergic  reaction to iron, other medicines, foods, dyes, or preservatives -pregnant or trying to get pregnant -breast-feeding How should I use this medicine? This medicine is for injection into a vein. It is given by a health care professional in a hospital or clinic setting. Talk to your pediatrician regarding the use of this medicine in children. Special care may be needed. Overdosage: If you think you have taken too much of this medicine contact a poison control center or emergency room at once. NOTE: This medicine is only for you. Do not share this medicine with others. What if I miss a dose? It is important not to miss your dose. Call your doctor or health care professional if you are unable to keep an appointment. What may interact with this medicine? This medicine may interact with the following medications: -other iron products This list may not describe all possible interactions. Give your health care provider a list of all the medicines, herbs, non-prescription drugs, or dietary supplements you use. Also tell them if you smoke, drink alcohol, or use illegal drugs. Some items may interact with your medicine. What should I watch for while using this medicine? Visit your doctor or healthcare professional regularly. Tell your doctor or healthcare  professional if your symptoms do not start to get better or if they get worse. You may need blood work done while you are taking this medicine. You may need to follow a special diet. Talk to your doctor. Foods that contain iron include: whole grains/cereals, dried fruits, beans, or peas, leafy green vegetables, and organ meats (liver, kidney). What side effects may I notice from receiving this medicine? Side effects that you should report to your doctor or health care professional as soon as possible: -allergic reactions like skin rash, itching or hives, swelling of the face, lips, or tongue -breathing problems -changes in blood pressure -feeling faint or  lightheaded, falls -fever or chills -flushing, sweating, or hot feelings -swelling of the ankles or feet Side effects that usually do not require medical attention (report to your doctor or health care professional if they continue or are bothersome): -diarrhea -headache -nausea, vomiting -stomach pain This list may not describe all possible side effects. Call your doctor for medical advice about side effects. You may report side effects to FDA at 1-800-FDA-1088. Where should I keep my medicine? This drug is given in a hospital or clinic and will not be stored at home. NOTE: This sheet is a summary. It may not cover all possible information. If you have questions about this medicine, talk to your doctor, pharmacist, or health care provider.  2018 Elsevier/Gold Standard (2015-08-01 12:41:49)

## 2017-09-15 LAB — TYPE AND SCREEN
ABO/RH(D): O POS
Antibody Screen: NEGATIVE
Unit division: 0
Unit division: 0

## 2017-09-15 LAB — BPAM RBC
Blood Product Expiration Date: 201903282359
Blood Product Expiration Date: 201903302359
ISSUE DATE / TIME: 201903050806
ISSUE DATE / TIME: 201903050806
UNIT TYPE AND RH: 5100
Unit Type and Rh: 5100

## 2017-09-16 DIAGNOSIS — E118 Type 2 diabetes mellitus with unspecified complications: Secondary | ICD-10-CM | POA: Diagnosis not present

## 2017-09-16 DIAGNOSIS — D500 Iron deficiency anemia secondary to blood loss (chronic): Secondary | ICD-10-CM | POA: Diagnosis not present

## 2017-09-16 DIAGNOSIS — Z7984 Long term (current) use of oral hypoglycemic drugs: Secondary | ICD-10-CM | POA: Diagnosis not present

## 2017-09-20 DIAGNOSIS — I10 Essential (primary) hypertension: Secondary | ICD-10-CM | POA: Diagnosis not present

## 2017-09-20 DIAGNOSIS — I639 Cerebral infarction, unspecified: Secondary | ICD-10-CM | POA: Diagnosis not present

## 2017-09-20 DIAGNOSIS — I428 Other cardiomyopathies: Secondary | ICD-10-CM | POA: Diagnosis not present

## 2017-09-20 DIAGNOSIS — I739 Peripheral vascular disease, unspecified: Secondary | ICD-10-CM | POA: Diagnosis not present

## 2017-09-22 DIAGNOSIS — J449 Chronic obstructive pulmonary disease, unspecified: Secondary | ICD-10-CM | POA: Diagnosis not present

## 2017-09-24 DIAGNOSIS — J449 Chronic obstructive pulmonary disease, unspecified: Secondary | ICD-10-CM | POA: Diagnosis not present

## 2017-10-11 ENCOUNTER — Encounter: Payer: Self-pay | Admitting: Family

## 2017-10-11 ENCOUNTER — Inpatient Hospital Stay: Payer: PPO | Attending: Family | Admitting: Family

## 2017-10-11 ENCOUNTER — Inpatient Hospital Stay: Payer: PPO

## 2017-10-11 ENCOUNTER — Other Ambulatory Visit: Payer: Self-pay

## 2017-10-11 VITALS — BP 131/79 | HR 68 | Temp 97.8°F | Resp 16 | Wt 226.0 lb

## 2017-10-11 DIAGNOSIS — I639 Cerebral infarction, unspecified: Secondary | ICD-10-CM | POA: Diagnosis not present

## 2017-10-11 DIAGNOSIS — D508 Other iron deficiency anemias: Secondary | ICD-10-CM

## 2017-10-11 DIAGNOSIS — D509 Iron deficiency anemia, unspecified: Secondary | ICD-10-CM | POA: Diagnosis not present

## 2017-10-11 DIAGNOSIS — N189 Chronic kidney disease, unspecified: Secondary | ICD-10-CM | POA: Insufficient documentation

## 2017-10-11 DIAGNOSIS — Z8673 Personal history of transient ischemic attack (TIA), and cerebral infarction without residual deficits: Secondary | ICD-10-CM | POA: Diagnosis not present

## 2017-10-11 DIAGNOSIS — D631 Anemia in chronic kidney disease: Secondary | ICD-10-CM

## 2017-10-11 DIAGNOSIS — I69954 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: Secondary | ICD-10-CM | POA: Diagnosis not present

## 2017-10-11 DIAGNOSIS — K769 Liver disease, unspecified: Secondary | ICD-10-CM | POA: Diagnosis not present

## 2017-10-11 DIAGNOSIS — J449 Chronic obstructive pulmonary disease, unspecified: Secondary | ICD-10-CM | POA: Insufficient documentation

## 2017-10-11 DIAGNOSIS — E291 Testicular hypofunction: Secondary | ICD-10-CM | POA: Diagnosis not present

## 2017-10-11 LAB — RETICULOCYTES
RBC.: 3.7 MIL/uL — ABNORMAL LOW (ref 4.20–5.82)
RETIC COUNT ABSOLUTE: 114.7 10*3/uL — AB (ref 34.8–93.9)
Retic Ct Pct: 3.1 % — ABNORMAL HIGH (ref 0.8–1.8)

## 2017-10-11 LAB — CBC WITH DIFFERENTIAL (CANCER CENTER ONLY)
BASOS ABS: 0 10*3/uL (ref 0.0–0.1)
Basophils Relative: 1 %
EOS ABS: 0.2 10*3/uL (ref 0.0–0.5)
Eosinophils Relative: 4 %
HCT: 31.9 % — ABNORMAL LOW (ref 38.7–49.9)
Hemoglobin: 9.7 g/dL — ABNORMAL LOW (ref 13.0–17.1)
Lymphocytes Relative: 24 %
Lymphs Abs: 1.3 10*3/uL (ref 0.9–3.3)
MCH: 26.9 pg — ABNORMAL LOW (ref 28.0–33.4)
MCHC: 30.4 g/dL — ABNORMAL LOW (ref 32.0–35.9)
MCV: 88.6 fL (ref 82.0–98.0)
MONO ABS: 0.3 10*3/uL (ref 0.1–0.9)
Monocytes Relative: 6 %
Neutro Abs: 3.5 10*3/uL (ref 1.5–6.5)
Neutrophils Relative %: 65 %
Platelet Count: 334 10*3/uL (ref 145–400)
RBC: 3.6 MIL/uL — AB (ref 4.20–5.70)
RDW: 16.6 % — AB (ref 11.1–15.7)
WBC: 5.3 10*3/uL (ref 4.0–10.0)

## 2017-10-11 LAB — CMP (CANCER CENTER ONLY)
ALT: 31 U/L (ref 10–47)
AST: 25 U/L (ref 11–38)
Albumin: 3.4 g/dL — ABNORMAL LOW (ref 3.5–5.0)
Alkaline Phosphatase: 104 U/L — ABNORMAL HIGH (ref 26–84)
Anion gap: 5 (ref 5–15)
BUN: 10 mg/dL (ref 7–22)
CALCIUM: 9.6 mg/dL (ref 8.0–10.3)
CO2: 33 mmol/L (ref 18–33)
Chloride: 105 mmol/L (ref 98–108)
Creatinine: 1.2 mg/dL (ref 0.60–1.20)
GLUCOSE: 154 mg/dL — AB (ref 73–118)
POTASSIUM: 3.2 mmol/L — AB (ref 3.3–4.7)
SODIUM: 143 mmol/L (ref 128–145)
TOTAL PROTEIN: 7.3 g/dL (ref 6.4–8.1)
Total Bilirubin: 0.5 mg/dL (ref 0.2–1.6)

## 2017-10-11 LAB — FERRITIN: FERRITIN: 65 ng/mL (ref 22–316)

## 2017-10-11 LAB — IRON AND TIBC
Iron: 35 ug/dL — ABNORMAL LOW (ref 42–163)
SATURATION RATIOS: 12 % — AB (ref 42–163)
TIBC: 294 ug/dL (ref 202–409)
UIBC: 259 ug/dL

## 2017-10-11 NOTE — Progress Notes (Signed)
Hematology and Oncology Follow Up Visit  JOBANY MONTELLANO [id number] 09-16-1955 62 y.o. 10/11/2017   Principle Diagnosis:  Iron deficiency anemia Hypotestosteronemia Chronic liver lesions  Current Therapy:   IV iron as indicated   Interim History:  Mr. Hommel is here today with his wife for follow-up. He feels that his energy improved after his iron infusion last month. His Hgb is now up to 9.7, MCV 88.  He still has some intermittent fatigue and chills at times.  His SOB with over exertion (stairs) due to COPD is unchanged.  His blood sugars are much improved and the his vision is no longer blurry.  He has had painful constipation with the oral iron so I told him to stop taking it since he is getting IV iron.  No fever, n/v, cough, rash, dizziness, chest pain, palpitations, abdominal pain or changes in bladder habits.  He has intermittent puffiness in his ankles. The numbness and tingling in his lower extremities due to chronic lower back issues id unchanged.  He has left sided weakness since having a stroke last summer.  No episodes of bleeding, no bruising or petechiae.  No lymphadenopathy found on exam.  He has maintained a good appetite and is staying well hydrated. His weight is stable.  No falls or syncopal episodes. He uses a cane when ambulating for support.   ECOG Performance Status: 1 - Symptomatic but completely ambulatory  Medications:  Allergies as of 10/11/2017   No Known Allergies     Medication List        Accurate as of 10/11/17  8:56 AM. Always use your most recent med list.          albuterol (2.5 MG/3ML) 0.083% nebulizer solution Commonly known as:  PROVENTIL Take 2.5 mg by nebulization every 6 (six) hours as needed for wheezing or shortness of breath.   allopurinol 300 MG tablet Commonly known as:  ZYLOPRIM Take 300 mg by mouth daily.   amLODipine 5 MG tablet Commonly known as:  NORVASC Take 5 mg by mouth daily.   aspirin 81 MG EC tablet Take 81 mg  by mouth daily. Swallow whole.   atorvastatin 80 MG tablet Commonly known as:  LIPITOR Take 1 tablet (80 mg total) by mouth every morning.   clopidogrel 75 MG tablet Commonly known as:  PLAVIX Take 1 tablet (75 mg total) by mouth daily.   DULoxetine 60 MG capsule Commonly known as:  CYMBALTA Take 60 mg by mouth daily.   ferrous sulfate 325 (65 FE) MG tablet Take 1 tablet (325 mg total) by mouth 3 (three) times daily with meals.   furosemide 20 MG tablet Commonly known as:  LASIX Take 20 mg by mouth 2 (two) times a week.   gabapentin 300 MG capsule Commonly known as:  NEURONTIN Take 300 mg by mouth 4 (four) times daily.   insulin NPH-regular Human (70-30) 100 UNIT/ML injection Commonly known as:  NOVOLIN 70/30 Please inject 25 units twice daily before breakfast and dinner.   ipratropium-albuterol 0.5-2.5 (3) MG/3ML Soln Commonly known as:  DUONEB Take 3 mLs by nebulization every 6 (six) hours as needed. DX: J44.9   losartan 100 MG tablet Commonly known as:  COZAAR Take 100 mg by mouth daily.   metoprolol succinate 25 MG 24 hr tablet Commonly known as:  TOPROL-XL Take 1 tablet (25 mg total) by mouth daily.   OXYGEN Inhale 2 L into the lungs as needed.   pantoprazole 20 MG tablet Commonly known  as:  PROTONIX Take 1 tablet by mouth daily.   potassium chloride SA 20 MEQ tablet Commonly known as:  K-DUR,KLOR-CON Take 1 tablet (20 mEq total) by mouth 2 (two) times daily.   primidone 50 MG tablet Commonly known as:  MYSOLINE Take 100 mg by mouth at bedtime.   tamsulosin 0.4 MG Caps capsule Commonly known as:  FLOMAX Take 1 capsule (0.4 mg total) by mouth daily after breakfast.   tiZANidine 4 MG tablet Commonly known as:  ZANAFLEX 4 mg 3 (three) times daily as needed for muscle spasms.   traMADol 50 MG tablet Commonly known as:  ULTRAM Take 50 mg by mouth daily as needed for moderate pain.   zolpidem 10 MG tablet Commonly known as:  AMBIEN Take 10 mg by  mouth at bedtime.       Allergies: No Known Allergies  Past Medical History, Surgical history, Social history, and Family History were reviewed and updated.  Review of Systems: All other 10 point review of systems is negative.   Physical Exam:  vitals were not taken for this visit.   Wt Readings from Last 3 Encounters:  09/13/17 219 lb (99.3 kg)  09/08/17 218 lb 3.2 oz (99 kg)  08/24/17 211 lb (95.7 kg)    Ocular: Sclerae unicteric, pupils equal, round and reactive to light Ear-nose-throat: Oropharynx clear, dentition fair Lymphatic: No cervical, supraclavicular or axillary adenopathy Lungs no rales or rhonchi, good excursion bilaterally Heart regular rate and rhythm, no murmur appreciated Abd soft, nontender, positive bowel sounds, no liver or spleen tip palpated on exam, no fluid wave  MSK no focal spinal tenderness, no joint edema Neuro: non-focal, well-oriented, appropriate affect Breasts: Deferred   Lab Results  Component Value Date   WBC 5.7 09/13/2017   HGB 8.8 (L) 08/26/2017   HCT 25.2 (L) 09/13/2017   MCV 88.4 09/13/2017   PLT 340 09/13/2017   Lab Results  Component Value Date   FERRITIN 78 09/13/2017   IRON 58 09/13/2017   TIBC 278 09/13/2017   UIBC 219 09/13/2017   IRONPCTSAT 21 (L) 09/13/2017   Lab Results  Component Value Date   RETICCTPCT 1.2 06/20/2015   RBC 2.85 (L) 09/13/2017   RETICCTABS 63.0 06/20/2015   No results found for: Nils Pyle [facility] Lab Results  Component Value Date   IGA 139 08/26/2017   No results found for: Odetta Pink, SPEI   Chemistry      Component Value Date/Time   NA 142 09/13/2017 0802   NA 141 09/25/2016 0809   K 3.2 (L) 09/13/2017 0802   K 2.8 (LL) 09/25/2016 0809   CL 103 09/13/2017 0802   CL 102 07/31/2016 0815   CO2 28 09/13/2017 0802   CO2 28 09/25/2016 0809   BUN 13 09/13/2017 0802   BUN 13.1 09/25/2016 0809   CREATININE 1.10  09/13/2017 0802   CREATININE 1.2 09/25/2016 0809      Component Value Date/Time   CALCIUM 9.8 09/13/2017 0802   CALCIUM 9.5 09/25/2016 0809   ALKPHOS 108 (H) 09/13/2017 0802   ALKPHOS 126 09/25/2016 0809   AST 26 09/13/2017 0802   AST 19 09/25/2016 0809   ALT 20 09/13/2017 0802   ALT 23 09/25/2016 0809   BILITOT 0.4 09/13/2017 0802   BILITOT 0.45 09/25/2016 0809      Impression and Plan: Mr. Kinner is a very pleasant 62 yo African American gentleman with both iron deficiency anemia and erythropoietin deficiency  anemia. His Hgb has improved since he received IV iron last month. His symptoms continue to resolve and he is feeling much better.  We will see what his iron studies show and bring him back in for infusion if need be.  We will see him back in another 6 weeks for follow-up.  He will contact our office with any questions or concerns. We can certainly see him sooner if need be.   Laverna Peace, NP 4/1/20198:56 AM

## 2017-10-20 ENCOUNTER — Inpatient Hospital Stay: Payer: PPO

## 2017-10-20 VITALS — BP 129/82 | HR 70 | Temp 97.8°F | Resp 17

## 2017-10-20 DIAGNOSIS — D509 Iron deficiency anemia, unspecified: Secondary | ICD-10-CM

## 2017-10-20 DIAGNOSIS — J449 Chronic obstructive pulmonary disease, unspecified: Secondary | ICD-10-CM | POA: Diagnosis not present

## 2017-10-20 MED ORDER — SODIUM CHLORIDE 0.9 % IV SOLN
510.0000 mg | Freq: Once | INTRAVENOUS | Status: AC
  Administered 2017-10-20: 510 mg via INTRAVENOUS
  Filled 2017-10-20: qty 17

## 2017-10-20 MED ORDER — SODIUM CHLORIDE 0.9 % IV SOLN
Freq: Once | INTRAVENOUS | Status: AC
  Administered 2017-10-20: 09:00:00 via INTRAVENOUS

## 2017-10-20 NOTE — Patient Instructions (Signed)

## 2017-10-23 DIAGNOSIS — J449 Chronic obstructive pulmonary disease, unspecified: Secondary | ICD-10-CM | POA: Diagnosis not present

## 2017-11-03 DIAGNOSIS — Z794 Long term (current) use of insulin: Secondary | ICD-10-CM | POA: Diagnosis not present

## 2017-11-03 DIAGNOSIS — I10 Essential (primary) hypertension: Secondary | ICD-10-CM | POA: Diagnosis not present

## 2017-11-03 DIAGNOSIS — E1149 Type 2 diabetes mellitus with other diabetic neurological complication: Secondary | ICD-10-CM | POA: Diagnosis not present

## 2017-11-22 ENCOUNTER — Inpatient Hospital Stay: Payer: PPO

## 2017-11-22 ENCOUNTER — Other Ambulatory Visit: Payer: Self-pay

## 2017-11-22 ENCOUNTER — Encounter: Payer: Self-pay | Admitting: Family

## 2017-11-22 ENCOUNTER — Inpatient Hospital Stay: Payer: PPO | Attending: Family | Admitting: Family

## 2017-11-22 VITALS — BP 147/94 | HR 88 | Temp 97.4°F | Resp 18 | Wt 226.0 lb

## 2017-11-22 DIAGNOSIS — D509 Iron deficiency anemia, unspecified: Secondary | ICD-10-CM | POA: Diagnosis not present

## 2017-11-22 DIAGNOSIS — K769 Liver disease, unspecified: Secondary | ICD-10-CM | POA: Insufficient documentation

## 2017-11-22 DIAGNOSIS — N189 Chronic kidney disease, unspecified: Secondary | ICD-10-CM | POA: Insufficient documentation

## 2017-11-22 DIAGNOSIS — R5383 Other fatigue: Secondary | ICD-10-CM | POA: Insufficient documentation

## 2017-11-22 DIAGNOSIS — R209 Unspecified disturbances of skin sensation: Secondary | ICD-10-CM | POA: Insufficient documentation

## 2017-11-22 DIAGNOSIS — D631 Anemia in chronic kidney disease: Secondary | ICD-10-CM | POA: Diagnosis not present

## 2017-11-22 DIAGNOSIS — E291 Testicular hypofunction: Secondary | ICD-10-CM | POA: Insufficient documentation

## 2017-11-22 DIAGNOSIS — J449 Chronic obstructive pulmonary disease, unspecified: Secondary | ICD-10-CM | POA: Diagnosis not present

## 2017-11-22 LAB — IRON AND TIBC
Iron: 21 ug/dL — ABNORMAL LOW (ref 42–163)
Saturation Ratios: 6 % — ABNORMAL LOW (ref 42–163)
TIBC: 339 ug/dL (ref 202–409)
UIBC: 318 ug/dL

## 2017-11-22 LAB — CBC WITH DIFFERENTIAL (CANCER CENTER ONLY)
BASOS PCT: 1 %
Basophils Absolute: 0.1 10*3/uL (ref 0.0–0.1)
Eosinophils Absolute: 0.3 10*3/uL (ref 0.0–0.5)
Eosinophils Relative: 4 %
HCT: 31.1 % — ABNORMAL LOW (ref 38.7–49.9)
HEMOGLOBIN: 9.5 g/dL — AB (ref 13.0–17.1)
LYMPHS ABS: 1.6 10*3/uL (ref 0.9–3.3)
Lymphocytes Relative: 23 %
MCH: 25.4 pg — AB (ref 28.0–33.4)
MCHC: 30.5 g/dL — ABNORMAL LOW (ref 32.0–35.9)
MCV: 83.2 fL (ref 82.0–98.0)
Monocytes Absolute: 0.4 10*3/uL (ref 0.1–0.9)
Monocytes Relative: 6 %
NEUTROS ABS: 4.6 10*3/uL (ref 1.5–6.5)
Neutrophils Relative %: 66 %
Platelet Count: 367 10*3/uL (ref 145–400)
RBC: 3.74 MIL/uL — AB (ref 4.20–5.70)
RDW: 16.2 % — ABNORMAL HIGH (ref 11.1–15.7)
WBC Count: 7 10*3/uL (ref 4.0–10.0)

## 2017-11-22 LAB — CMP (CANCER CENTER ONLY)
ALBUMIN: 3.5 g/dL (ref 3.5–5.0)
ALT: 38 U/L (ref 10–47)
AST: 33 U/L (ref 11–38)
Alkaline Phosphatase: 112 U/L — ABNORMAL HIGH (ref 26–84)
Anion gap: 6 (ref 5–15)
BUN: 10 mg/dL (ref 7–22)
CALCIUM: 10 mg/dL (ref 8.0–10.3)
CO2: 32 mmol/L (ref 18–33)
CREATININE: 1.3 mg/dL — AB (ref 0.60–1.20)
Chloride: 106 mmol/L (ref 98–108)
GLUCOSE: 130 mg/dL — AB (ref 73–118)
Potassium: 3.1 mmol/L — ABNORMAL LOW (ref 3.3–4.7)
Sodium: 144 mmol/L (ref 128–145)
Total Bilirubin: 0.6 mg/dL (ref 0.2–1.6)
Total Protein: 8 g/dL (ref 6.4–8.1)

## 2017-11-22 LAB — RETICULOCYTES
RBC.: 3.82 MIL/uL — ABNORMAL LOW (ref 4.20–5.82)
Retic Count, Absolute: 87.9 10*3/uL (ref 34.8–93.9)
Retic Ct Pct: 2.3 % — ABNORMAL HIGH (ref 0.8–1.8)

## 2017-11-22 LAB — FERRITIN: FERRITIN: 35 ng/mL (ref 22–316)

## 2017-11-22 NOTE — Progress Notes (Signed)
Hematology and Oncology Follow Up Visit  James Zuniga [id number] [birth year] 62 y.o. 11/22/2017   Principle Diagnosis:  Iron deficiency anemia Hypotestosteronemia Chronic liver lesions  Current Therapy:   IV iron as indicated   Interim History:  James Zuniga is here today with his wife for follow up. He is doing well but still having persistent fatigue. He received a dose of IV iron in April for iron saturation of 12%.  His SOB with exertion is unchanged.  He denies any episodes of bleeding, no bruising or petechiae.  No fever, chills, n/v, cough, rash, dizziness, chest pain, palpitations, abdominal pain or changes in bowel or bladder habits.  No lymphadenopathy found on exam.  The numbness and tingling in his fingertips and and feet is unchanged. He will have some occasional puffiness in his feet and ankles. This improves if he elevates his feet.  No falls or syncopal episodes.  He has maintained a good appetite and is staying well hydrated. His weight is stable.   ECOG Performance Status: 1 - Symptomatic but completely ambulatory  Medications:  Allergies as of 11/22/2017   No Known Allergies     Medication List        Accurate as of 11/22/17  8:48 AM. Always use your most recent med list.          albuterol (2.5 MG/3ML) 0.083% nebulizer solution Commonly known as:  PROVENTIL Take 2.5 mg by nebulization every 6 (six) hours as needed for wheezing or shortness of breath.   allopurinol 300 MG tablet Commonly known as:  ZYLOPRIM Take 300 mg by mouth daily.   amLODipine 5 MG tablet Commonly known as:  NORVASC Take 5 mg by mouth daily.   aspirin 81 MG EC tablet Take 81 mg by mouth daily. Swallow whole.   atorvastatin 80 MG tablet Commonly known as:  LIPITOR Take 1 tablet (80 mg total) by mouth every morning.   clopidogrel 75 MG tablet Commonly known as:  PLAVIX Take 1 tablet (75 mg total) by mouth daily.   DULoxetine 60 MG capsule Commonly known as:  CYMBALTA Take  60 mg by mouth daily.   ferrous sulfate 325 (65 FE) MG tablet Take 1 tablet (325 mg total) by mouth 3 (three) times daily with meals.   furosemide 20 MG tablet Commonly known as:  LASIX Take 20 mg by mouth 2 (two) times a week.   gabapentin 300 MG capsule Commonly known as:  NEURONTIN Take 300 mg by mouth 4 (four) times daily.   insulin NPH-regular Human (70-30) 100 UNIT/ML injection Commonly known as:  NOVOLIN 70/30 Please inject 25 units twice daily before breakfast and dinner.   ipratropium-albuterol 0.5-2.5 (3) MG/3ML Soln Commonly known as:  DUONEB Take 3 mLs by nebulization every 6 (six) hours as needed. DX: J44.9   losartan 100 MG tablet Commonly known as:  COZAAR Take 100 mg by mouth daily.   metoprolol succinate 25 MG 24 hr tablet Commonly known as:  TOPROL-XL Take 1 tablet (25 mg total) by mouth daily.   OXYGEN Inhale 2 L into the lungs as needed.   pantoprazole 20 MG tablet Commonly known as:  PROTONIX Take 1 tablet by mouth daily.   potassium chloride SA 20 MEQ tablet Commonly known as:  K-DUR,KLOR-CON Take 1 tablet (20 mEq total) by mouth 2 (two) times daily.   primidone 50 MG tablet Commonly known as:  MYSOLINE Take 100 mg by mouth at bedtime.   tamsulosin 0.4 MG Caps capsule Commonly  known as:  FLOMAX Take 1 capsule (0.4 mg total) by mouth daily after breakfast.   tiZANidine 4 MG tablet Commonly known as:  ZANAFLEX 4 mg 3 (three) times daily as needed for muscle spasms.   traMADol 50 MG tablet Commonly known as:  ULTRAM Take 50 mg by mouth daily as needed for moderate pain.   zolpidem 10 MG tablet Commonly known as:  AMBIEN Take 10 mg by mouth at bedtime.       Allergies: No Known Allergies  Past Medical History, Surgical history, Social history, and Family History were reviewed and updated.  Review of Systems: All other 10 point review of systems is negative.   Physical Exam:  vitals were not taken for this visit.   Wt Readings  from Last 3 Encounters:  10/11/17 226 lb (102.5 kg)  09/13/17 219 lb (99.3 kg)  09/08/17 218 lb 3.2 oz (99 kg)    Ocular: Sclerae unicteric, pupils equal, round and reactive to light Ear-nose-throat: Oropharynx clear, dentition fair Lymphatic: No cervical, supraclavicular or axillary adenopathy Lungs no rales or rhonchi, good excursion bilaterally Heart regular rate and rhythm, no murmur appreciated Abd soft, nontender, positive bowel sounds, no liver or spleen tip palpated on exam, no fluid wave  MSK no focal spinal tenderness, no joint edema Neuro: non-focal, well-oriented, appropriate affect Breasts: Deferred   Lab Results  Component Value Date   WBC 7.0 11/22/2017   HGB 9.5 (L) 11/22/2017   HCT 31.1 (L) 11/22/2017   MCV 83.2 11/22/2017   PLT 367 11/22/2017   Lab Results  Component Value Date   FERRITIN 65 10/11/2017   IRON 35 (L) 10/11/2017   TIBC 294 10/11/2017   UIBC 259 10/11/2017   IRONPCTSAT 12 (L) 10/11/2017   Lab Results  Component Value Date   RETICCTPCT 3.1 (H) 10/11/2017   RBC 3.74 (L) 11/22/2017   RETICCTABS 63.0 06/20/2015   No results found for: Nils Pyle [facility] Lab Results  Component Value Date   IGA 139 08/26/2017   No results found for: Odetta Pink, SPEI   Chemistry      Component Value Date/Time   NA 143 10/11/2017 0835   NA 141 09/25/2016 0809   K 3.2 (L) 10/11/2017 0835   K 2.8 (LL) 09/25/2016 0809   CL 105 10/11/2017 0835   CL 102 07/31/2016 0815   CO2 33 10/11/2017 0835   CO2 28 09/25/2016 0809   BUN 10 10/11/2017 0835   BUN 13.1 09/25/2016 0809   CREATININE 1.20 10/11/2017 0835   CREATININE 1.2 09/25/2016 0809      Component Value Date/Time   CALCIUM 9.6 10/11/2017 0835   CALCIUM 9.5 09/25/2016 0809   ALKPHOS 104 (H) 10/11/2017 0835   ALKPHOS 126 09/25/2016 0809   AST 25 10/11/2017 0835   AST 19 09/25/2016 0809   ALT 31 10/11/2017 0835   ALT 23  09/25/2016 0809   BILITOT 0.5 10/11/2017 0835   BILITOT 0.45 09/25/2016 0809      Impression and Plan: James Zuniga is a very pleasant 62 yo African American gentleman with 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 He is still having persistent fatigue.  We will see what his iron studies show and bring him back in for infusion if needed.  We will go ahead and plan to see him back in another 6 weeks for follow-up.  They will contact our office with any questions or concerns. We can certainly see him  sooner if need be.   Laverna Peace, NP 5/13/20198:48 AM

## 2017-11-26 ENCOUNTER — Inpatient Hospital Stay: Payer: PPO

## 2017-11-26 VITALS — BP 118/81 | HR 73 | Temp 97.5°F | Resp 16

## 2017-11-26 DIAGNOSIS — D509 Iron deficiency anemia, unspecified: Secondary | ICD-10-CM

## 2017-11-26 MED ORDER — SODIUM CHLORIDE 0.9 % IV SOLN
510.0000 mg | Freq: Once | INTRAVENOUS | Status: AC
  Administered 2017-11-26: 510 mg via INTRAVENOUS
  Filled 2017-11-26: qty 17

## 2017-11-26 NOTE — Patient Instructions (Signed)

## 2017-12-03 ENCOUNTER — Inpatient Hospital Stay: Payer: PPO

## 2017-12-03 VITALS — BP 107/68 | HR 71 | Temp 97.9°F | Resp 20

## 2017-12-03 DIAGNOSIS — J449 Chronic obstructive pulmonary disease, unspecified: Secondary | ICD-10-CM | POA: Diagnosis not present

## 2017-12-03 DIAGNOSIS — D509 Iron deficiency anemia, unspecified: Secondary | ICD-10-CM

## 2017-12-03 DIAGNOSIS — E349 Endocrine disorder, unspecified: Secondary | ICD-10-CM

## 2017-12-03 MED ORDER — SODIUM CHLORIDE 0.9 % IV SOLN
510.0000 mg | Freq: Once | INTRAVENOUS | Status: AC
  Administered 2017-12-03: 510 mg via INTRAVENOUS
  Filled 2017-12-03: qty 17

## 2017-12-03 MED ORDER — SODIUM CHLORIDE 0.9 % IV SOLN
INTRAVENOUS | Status: DC
  Administered 2017-12-03: 08:00:00 via INTRAVENOUS

## 2017-12-03 MED ORDER — TESTOSTERONE CYPIONATE 200 MG/ML IM SOLN: 300.0000 mg | INTRAMUSCULAR | Status: DC

## 2017-12-03 NOTE — Patient Instructions (Signed)

## 2017-12-05 IMAGING — CT CT HEAD W/O CM
1 series · 16 of 30 positions shown, 20 images · non-contrast
Comparison: None.

CLINICAL DATA: Memory changes, hypertension.

EXAM:
CT HEAD WITHOUT CONTRAST
TECHNIQUE: Contiguous axial images were obtained from the base of the skull
through the vertex without intravenous contrast.

[Series 2: head wo · axial · 0.45mm/px · z∈[+1262,+1407]mm · 16 of 33 slices shown, 20 images]
[im 2/33  brain]
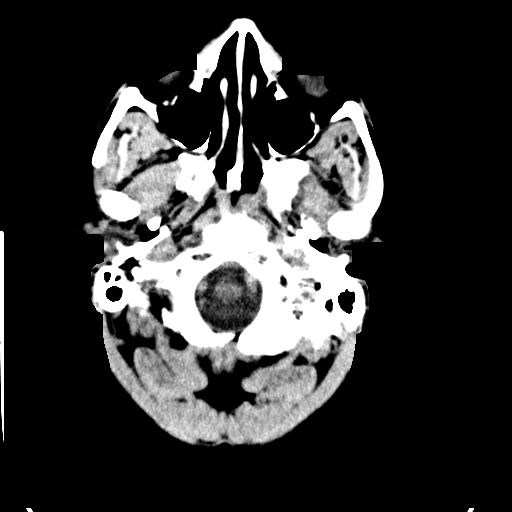
[im 2/33  bone]
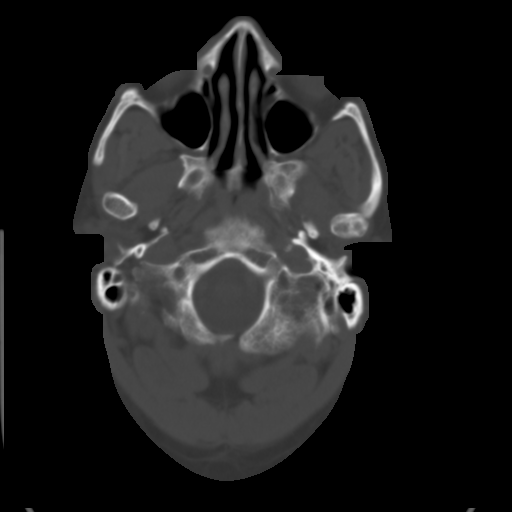
[im 4/33  brain]
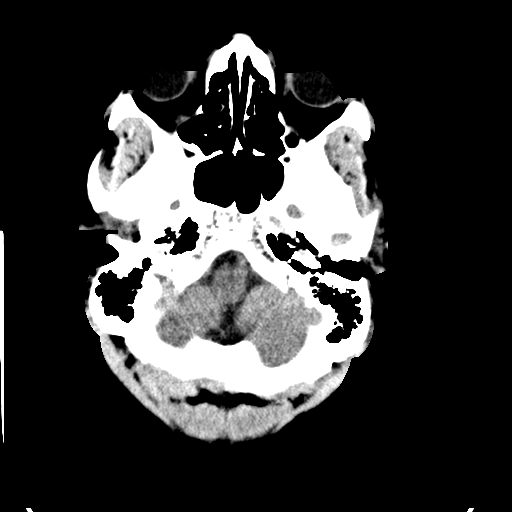
[im 6/33  brain]
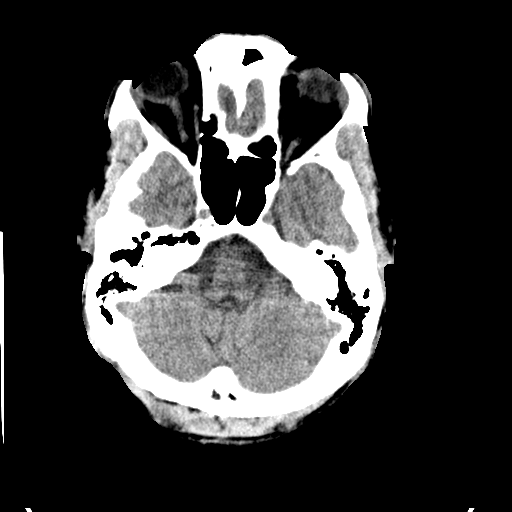
[im 8/33  brain]
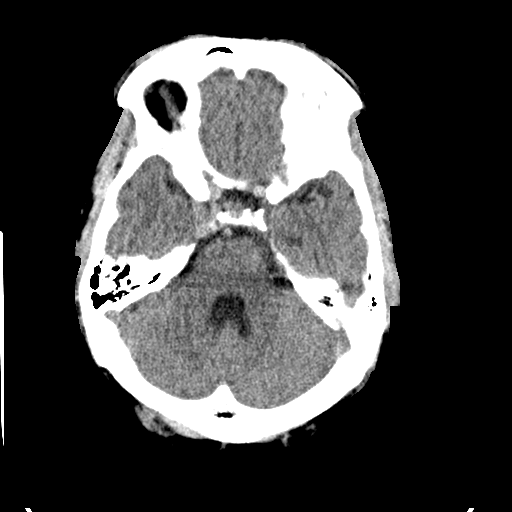
[im 9/33  brain]
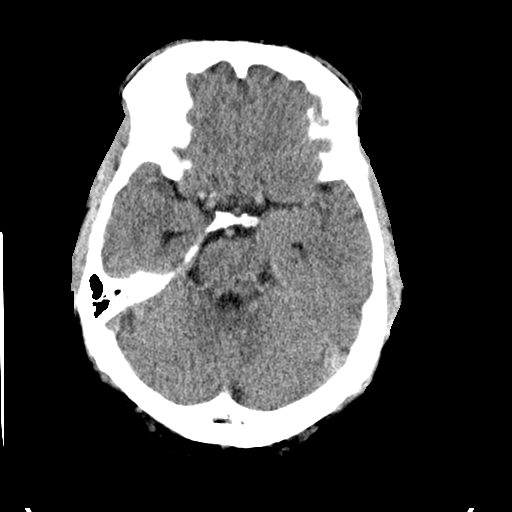
[im 9/33  bone]
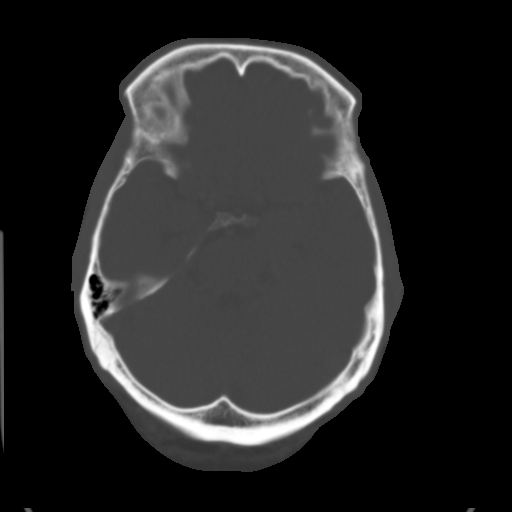
[im 12/33  brain]
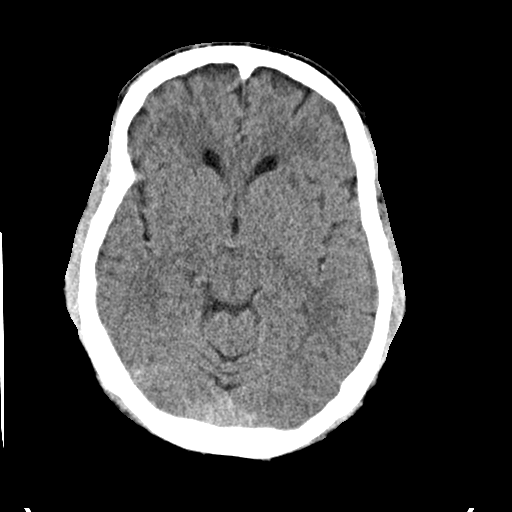
[im 14/33  brain]
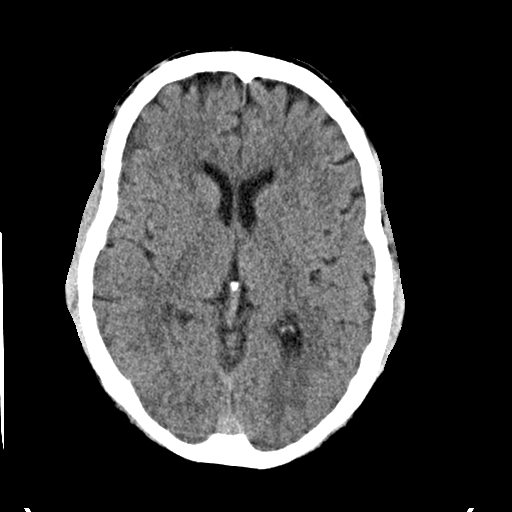
[im 16/33  brain]
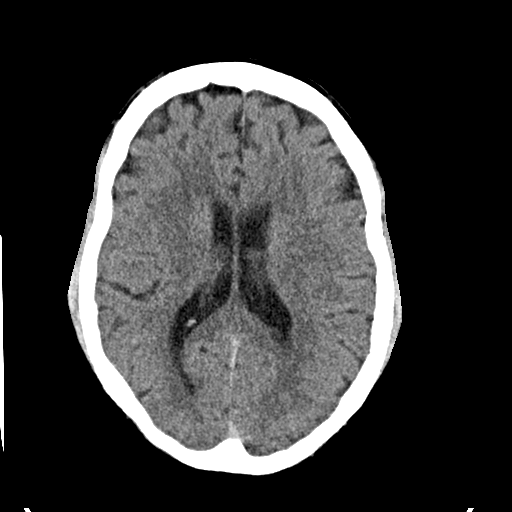
[im 17/33  brain]
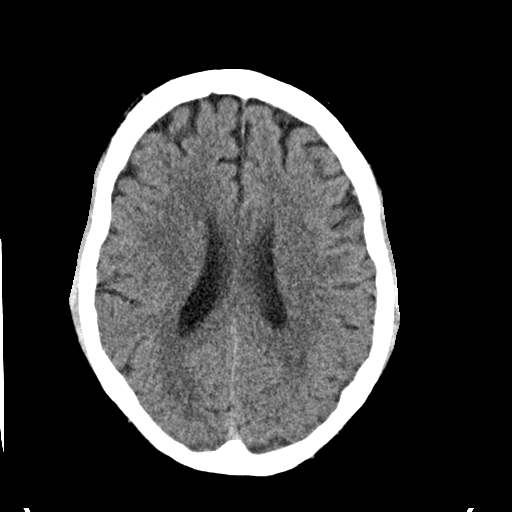
[im 17/33  bone]
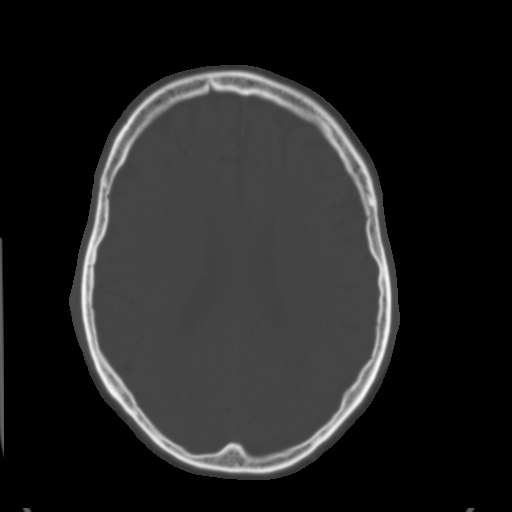
[im 19/33  brain]
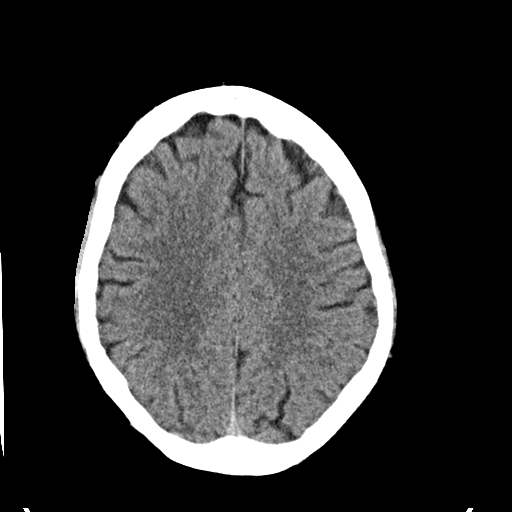
[im 21/33  brain]
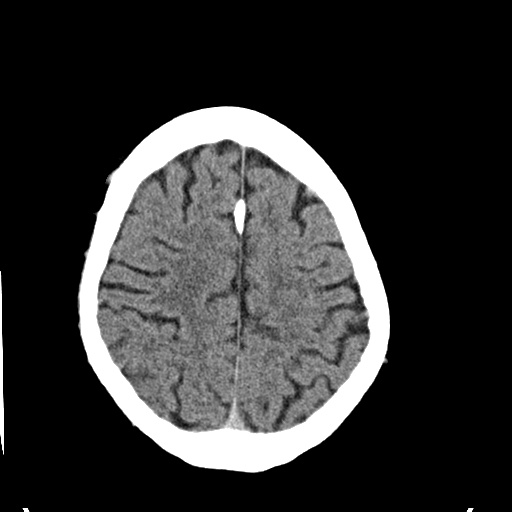
[im 24/33  brain]
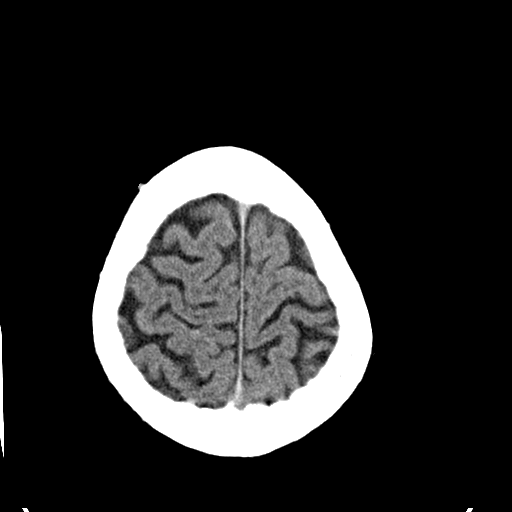
[im 25/33  brain]
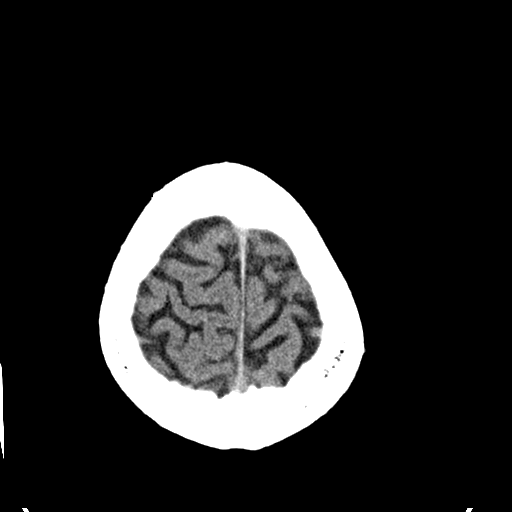
[im 25/33  bone]
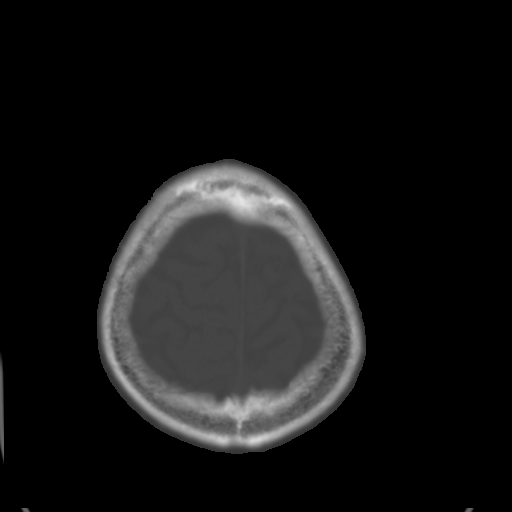
[im 27/33  brain]
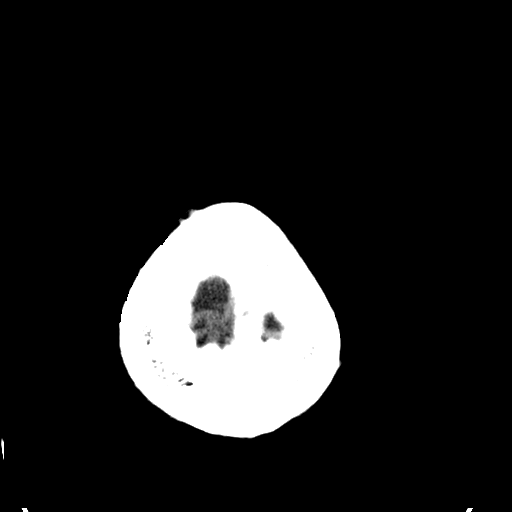
[im 29/33  brain]
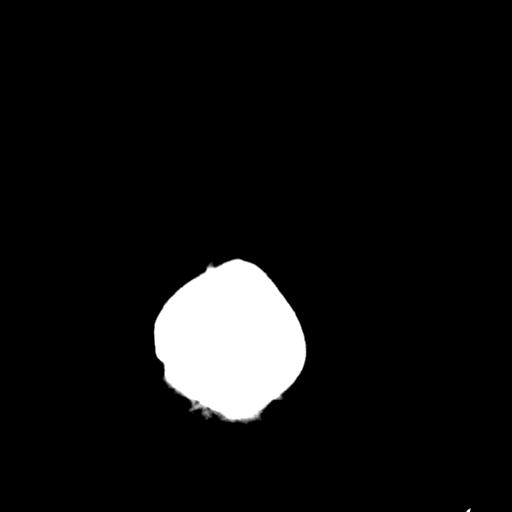
[im 31/33  brain]
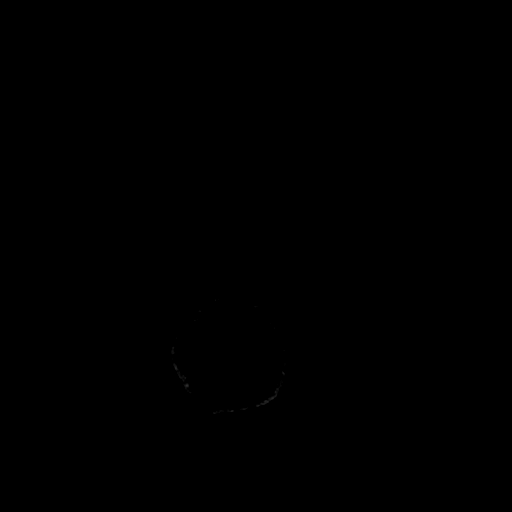

[16 of 30 positions shown; findings below may reference images not displayed]

FINDINGS: Brain: There is mild generalized brain atrophy with commensurate
dilatation of the sulci. Ventricles are normal in size and
configuration. Very mild chronic small vessel ischemic change noted
within the deep periventricular white matter regions. All other
areas of the brain demonstrate normal gray-white matter attenuation.
There is no mass, hemorrhage, edema or other evidence of acute
parenchymal abnormality. No extra-axial hemorrhage.

Vascular: No hyperdense vessel or unexpected calcification.

Skull: Negative for fracture or focal lesion.

Sinuses/Orbits: No acute findings.

Other: None.
IMPRESSION: No acute findings.  No intracranial mass, hemorrhage or edema.

Mild atrophy and mild chronic small vessel ischemic change in the
deep white matter.

## 2017-12-15 DIAGNOSIS — H40013 Open angle with borderline findings, low risk, bilateral: Secondary | ICD-10-CM | POA: Diagnosis not present

## 2017-12-15 DIAGNOSIS — E119 Type 2 diabetes mellitus without complications: Secondary | ICD-10-CM | POA: Diagnosis not present

## 2017-12-23 DIAGNOSIS — J449 Chronic obstructive pulmonary disease, unspecified: Secondary | ICD-10-CM | POA: Diagnosis not present

## 2017-12-28 DIAGNOSIS — J449 Chronic obstructive pulmonary disease, unspecified: Secondary | ICD-10-CM | POA: Diagnosis not present

## 2018-01-03 ENCOUNTER — Encounter: Payer: Self-pay | Admitting: Family

## 2018-01-03 ENCOUNTER — Other Ambulatory Visit: Payer: Self-pay | Admitting: *Deleted

## 2018-01-03 ENCOUNTER — Inpatient Hospital Stay: Payer: PPO

## 2018-01-03 ENCOUNTER — Other Ambulatory Visit: Payer: Self-pay

## 2018-01-03 ENCOUNTER — Inpatient Hospital Stay: Payer: PPO | Attending: Family | Admitting: Family

## 2018-01-03 VITALS — BP 136/81 | HR 79 | Temp 98.4°F | Resp 16 | Ht 66.0 in | Wt 227.0 lb

## 2018-01-03 DIAGNOSIS — E291 Testicular hypofunction: Secondary | ICD-10-CM | POA: Insufficient documentation

## 2018-01-03 DIAGNOSIS — D631 Anemia in chronic kidney disease: Secondary | ICD-10-CM | POA: Diagnosis not present

## 2018-01-03 DIAGNOSIS — Z8673 Personal history of transient ischemic attack (TIA), and cerebral infarction without residual deficits: Secondary | ICD-10-CM | POA: Diagnosis not present

## 2018-01-03 DIAGNOSIS — D649 Anemia, unspecified: Secondary | ICD-10-CM

## 2018-01-03 DIAGNOSIS — K769 Liver disease, unspecified: Secondary | ICD-10-CM | POA: Insufficient documentation

## 2018-01-03 DIAGNOSIS — D509 Iron deficiency anemia, unspecified: Secondary | ICD-10-CM

## 2018-01-03 DIAGNOSIS — D500 Iron deficiency anemia secondary to blood loss (chronic): Secondary | ICD-10-CM

## 2018-01-03 DIAGNOSIS — N189 Chronic kidney disease, unspecified: Secondary | ICD-10-CM | POA: Diagnosis not present

## 2018-01-03 LAB — RETICULOCYTES
RBC.: 3.24 MIL/uL — AB (ref 4.20–5.82)
RETIC COUNT ABSOLUTE: 110.2 10*3/uL — AB (ref 34.8–93.9)
RETIC CT PCT: 3.4 % — AB (ref 0.8–1.8)

## 2018-01-03 LAB — CMP (CANCER CENTER ONLY)
ALBUMIN: 3.6 g/dL (ref 3.5–5.0)
ALT: 18 U/L (ref 0–55)
AST: 26 U/L (ref 5–34)
Alkaline Phosphatase: 95 U/L (ref 40–150)
Anion gap: 12 — ABNORMAL HIGH (ref 3–11)
BILIRUBIN TOTAL: 0.3 mg/dL (ref 0.2–1.2)
BUN: 17 mg/dL (ref 7–26)
CHLORIDE: 102 mmol/L (ref 98–109)
CO2: 26 mmol/L (ref 22–29)
Calcium: 9.9 mg/dL (ref 8.4–10.4)
Creatinine: 1.31 mg/dL — ABNORMAL HIGH (ref 0.70–1.30)
GFR, Est AFR Am: >60 mL/min (ref >60)
GFR, Estimated: 57 mL/min — ABNORMAL LOW (ref >60)
GLUCOSE: 98 mg/dL (ref 70–140)
POTASSIUM: 3.3 mmol/L — AB (ref 3.5–5.1)
Sodium: 140 mmol/L (ref 136–145)
Total Protein: 7.7 g/dL (ref 6.4–8.3)

## 2018-01-03 LAB — CBC WITH DIFFERENTIAL (CANCER CENTER ONLY)
BASOS ABS: 0 10*3/uL (ref 0.0–0.1)
BASOS PCT: 1 %
Eosinophils Absolute: 0.2 10*3/uL (ref 0.0–0.5)
Eosinophils Relative: 3 %
HCT: 27 % — ABNORMAL LOW (ref 38.7–49.9)
Hemoglobin: 8 g/dL — ABNORMAL LOW (ref 13.0–17.1)
Lymphocytes Relative: 23 %
Lymphs Abs: 1.6 10*3/uL (ref 0.9–3.3)
MCH: 25.1 pg — ABNORMAL LOW (ref 28.0–33.4)
MCHC: 29.6 g/dL — ABNORMAL LOW (ref 32.0–35.9)
MCV: 84.6 fL (ref 82.0–98.0)
MONO ABS: 0.4 10*3/uL (ref 0.1–0.9)
Monocytes Relative: 6 %
NEUTROS PCT: 67 %
Neutro Abs: 4.4 10*3/uL (ref 1.5–6.5)
Platelet Count: 376 10*3/uL (ref 145–400)
RBC: 3.19 MIL/uL — ABNORMAL LOW (ref 4.20–5.70)
RDW: 18.4 % — AB (ref 11.1–15.7)
WBC: 6.6 10*3/uL (ref 4.0–10.0)

## 2018-01-03 LAB — PREPARE RBC (CROSSMATCH)

## 2018-01-03 LAB — IRON AND TIBC
Iron: 20 ug/dL — ABNORMAL LOW (ref 42–163)
Saturation Ratios: 6 % — ABNORMAL LOW (ref 42–163)
TIBC: 333 ug/dL (ref 202–409)
UIBC: 312 ug/dL

## 2018-01-03 LAB — SAMPLE TO BLOOD BANK

## 2018-01-03 LAB — FERRITIN: FERRITIN: 35 ng/mL (ref 22–316)

## 2018-01-03 NOTE — Progress Notes (Signed)
Hematology and Oncology Follow Up Visit  James Zuniga [id number] 03-20-1956 62 y.o. 01/03/2018   Principle Diagnosis:  Iron deficiency anemia Hypotestosteronemia Chronic liver lesions  Of Note: History of stroke in June 2018, no more Testosterone or ESA's   Current Therapy:   IV iron as indicated - last received in May 2019 x 2   Interim History:  James Zuniga is here today for follow-up. He is symptomatic with fatigue, SOB with exertion, puffiness in his lower extremities that comes and goes, chest pain/palpitations and a fall in the kitchen last week with leg weakness.  He is on Plavix and aspirin since his stroke last summer. He has not noticed any blood in his stool, no episodes of bleeding elsewhere, no bruising or petechiae.  No fever, chills, n/v, cough, rash, abdominal pain or changes in bowel or bladder habits.  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  No lymphadenopathy noted on exam.  He has maintained a good appetite and is staying well hydrated. His weight is stable.  He states that his blood sugars are fairly well controlled.   ECOG Performance Status: 1 - Symptomatic but completely ambulatory  Medications:  Allergies as of 01/03/2018   No Known Allergies     Medication List        Accurate as of 01/03/18  8:36 AM. Always use your most recent med list.          albuterol (2.5 MG/3ML) 0.083% nebulizer solution Commonly known as:  PROVENTIL Take 2.5 mg by nebulization every 6 (six) hours as needed for wheezing or shortness of breath.   allopurinol 300 MG tablet Commonly known as:  ZYLOPRIM Take 300 mg by mouth daily.   amLODipine 5 MG tablet Commonly known as:  NORVASC Take 5 mg by mouth daily.   aspirin 81 MG EC tablet Take 81 mg by mouth daily. Swallow whole.   atorvastatin 80 MG tablet Commonly known as:  LIPITOR Take 1 tablet (80 mg total) by mouth every morning.   clopidogrel 75 MG  tablet Commonly known as:  PLAVIX Take 1 tablet (75 mg total) by mouth daily.   DULoxetine 60 MG capsule Commonly known as:  CYMBALTA Take 60 mg by mouth daily.   furosemide 20 MG tablet Commonly known as:  LASIX Take 20 mg by mouth 2 (two) times a week.   gabapentin 300 MG capsule Commonly known as:  NEURONTIN Take 300 mg by mouth 4 (four) times daily.   insulin NPH-regular Human (70-30) 100 UNIT/ML injection Commonly known as:  NOVOLIN 70/30 Please inject 25 units twice daily before breakfast and dinner.   ipratropium-albuterol 0.5-2.5 (3) MG/3ML Soln Commonly known as:  DUONEB Take 3 mLs by nebulization every 6 (six) hours as needed. DX: J44.9   losartan 100 MG tablet Commonly known as:  COZAAR Take 100 mg by mouth daily.   metoprolol succinate 25 MG 24 hr tablet Commonly known as:  TOPROL-XL Take 1 tablet (25 mg total) by mouth daily.   OXYGEN Inhale 2 L into the lungs as needed.   pantoprazole 20 MG tablet Commonly known as:  PROTONIX Take 1 tablet by mouth daily.   potassium chloride SA 20 MEQ tablet Commonly known as:  K-DUR,KLOR-CON Take 1 tablet (20 mEq total) by mouth 2 (two) times daily.   primidone 50 MG tablet Commonly known as:  MYSOLINE Take 100 mg by mouth at bedtime.   tamsulosin  0.4 MG Caps capsule Commonly known as:  FLOMAX Take 1 capsule (0.4 mg total) by mouth daily after breakfast.   tiZANidine 4 MG tablet Commonly known as:  ZANAFLEX 4 mg 3 (three) times daily as needed for muscle spasms.   traMADol 50 MG tablet Commonly known as:  ULTRAM Take 50 mg by mouth daily as needed for moderate pain.   zolpidem 10 MG tablet Commonly known as:  AMBIEN Take 10 mg by mouth at bedtime.       Allergies: No Known Allergies  Past Medical History, Surgical history, Social history, and Family History were reviewed and updated.  Review of Systems: All other 10 point review of systems is negative.   Physical Exam:  vitals were not taken  for this visit.   Wt Readings from Last 3 Encounters:  11/22/17 226 lb (102.5 kg)  10/11/17 226 lb (102.5 kg)  09/13/17 219 lb (99.3 kg)    Ocular: Sclerae unicteric, pupils equal, round and reactive to light Ear-nose-throat: Oropharynx clear, dentition fair Lymphatic: No cervical, supraclavicular or axillary adenopathy Lungs no rales or rhonchi, good excursion bilaterally Heart regular rate and rhythm, no murmur appreciated Abd soft, nontender, positive bowel sounds, no liver or spleen tip palpated on exam, no fluid wave  MSK no focal spinal tenderness, no joint edema Neuro: non-focal, well-oriented, appropriate affect, left sided residual weakness after stroke Breasts: Deferred   Lab Results  Component Value Date   WBC 6.6 01/03/2018   HGB 8.0 (L) 01/03/2018   HCT 27.0 (L) 01/03/2018   MCV 84.6 01/03/2018   PLT 376 01/03/2018   Lab Results  Component Value Date   FERRITIN 35 11/22/2017   IRON 21 (L) 11/22/2017   TIBC 339 11/22/2017   UIBC 318 11/22/2017   IRONPCTSAT 6 (L) 11/22/2017   Lab Results  Component Value Date   RETICCTPCT 2.3 (H) 11/22/2017   RBC 3.19 (L) 01/03/2018   RETICCTABS 63.0 06/20/2015   No results found for: Nils Pyle [facility] Lab Results  Component Value Date   IGA 139 08/26/2017   No results found for: Odetta Pink, SPEI   Chemistry      Component Value Date/Time   NA 144 11/22/2017 0811   NA 141 09/25/2016 0809   K 3.1 (L) 11/22/2017 0811   K 2.8 (LL) 09/25/2016 0809   CL 106 11/22/2017 0811   CL 102 07/31/2016 0815   CO2 32 11/22/2017 0811   CO2 28 09/25/2016 0809   BUN 10 11/22/2017 0811   BUN 13.1 09/25/2016 0809   CREATININE 1.30 (H) 11/22/2017 0811   CREATININE 1.2 09/25/2016 0809      Component Value Date/Time   CALCIUM 10.0 11/22/2017 0811   CALCIUM 9.5 09/25/2016 0809   ALKPHOS 112 (H) 11/22/2017 0811   ALKPHOS 126 09/25/2016 0809   AST 33  11/22/2017 0811   AST 19 09/25/2016 0809   ALT 38 11/22/2017 0811   ALT 23 09/25/2016 0809   BILITOT 0.6 11/22/2017 0811   BILITOT 0.45 09/25/2016 0809      Impression and Plan: James Zuniga is a very pleasant 62 yo African American gentleman with 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  He is on Plavix and aspirin since having a stroke 1 year ago. Hgb today is 8.0 and he is symptomatic as mentioned above.  We will give him 1 unit of blood as well as IV iron tomorrow. Possibly a second dose of IV iron  next week if needed.  We will plan to see him back in another month for follow-up.  They will contact our office with any questions or concerns. We can certainly see him sooner if needed.   Laverna Peace, NP 6/24/20198:36 AM

## 2018-01-04 ENCOUNTER — Inpatient Hospital Stay: Payer: PPO

## 2018-01-04 ENCOUNTER — Other Ambulatory Visit: Payer: Self-pay

## 2018-01-04 VITALS — BP 114/71 | HR 73 | Temp 98.5°F | Resp 18

## 2018-01-04 DIAGNOSIS — D500 Iron deficiency anemia secondary to blood loss (chronic): Secondary | ICD-10-CM

## 2018-01-04 DIAGNOSIS — D649 Anemia, unspecified: Secondary | ICD-10-CM

## 2018-01-04 DIAGNOSIS — D509 Iron deficiency anemia, unspecified: Secondary | ICD-10-CM

## 2018-01-04 MED ORDER — ACETAMINOPHEN 325 MG PO TABS
650.0000 mg | ORAL_TABLET | Freq: Once | ORAL | Status: AC
  Administered 2018-01-04: 650 mg via ORAL

## 2018-01-04 MED ORDER — DIPHENHYDRAMINE HCL 25 MG PO CAPS
ORAL_CAPSULE | ORAL | Status: AC
  Filled 2018-01-04: qty 1

## 2018-01-04 MED ORDER — ACETAMINOPHEN 325 MG PO TABS
ORAL_TABLET | ORAL | Status: AC
  Filled 2018-01-04: qty 2

## 2018-01-04 MED ORDER — SODIUM CHLORIDE 0.9 % IV SOLN
250.0000 mL | Freq: Once | INTRAVENOUS | Status: AC
  Administered 2018-01-04: 250 mL via INTRAVENOUS

## 2018-01-04 MED ORDER — FUROSEMIDE 10 MG/ML IJ SOLN: 20.0000 mg | Freq: Once | INTRAMUSCULAR | Status: DC

## 2018-01-04 MED ORDER — DIPHENHYDRAMINE HCL 25 MG PO CAPS
25.0000 mg | ORAL_CAPSULE | Freq: Once | ORAL | Status: AC
  Administered 2018-01-04: 25 mg via ORAL

## 2018-01-04 MED ORDER — SODIUM CHLORIDE 0.9 % IV SOLN
510.0000 mg | Freq: Once | INTRAVENOUS | Status: AC
  Administered 2018-01-04: 510 mg via INTRAVENOUS
  Filled 2018-01-04: qty 17

## 2018-01-04 NOTE — Patient Instructions (Signed)
Ferumoxytol injection What is this medicine? FERUMOXYTOL is an iron complex. Iron is used to make healthy red blood cells, which carry oxygen and nutrients throughout the body. This medicine is used to treat iron deficiency anemia in people with chronic kidney disease. This medicine may be used for other purposes; ask your health care provider or pharmacist if you have questions. COMMON BRAND NAME(S): Feraheme What should I tell my health care provider before I take this medicine? They need to know if you have any of these conditions: -anemia not caused by low iron levels -high levels of iron in the blood -magnetic resonance imaging (MRI) test scheduled -an unusual or allergic reaction to iron, other medicines, foods, dyes, or preservatives -pregnant or trying to get pregnant -breast-feeding How should I use this medicine? This medicine is for injection into a vein. It is given by a health care professional in a hospital or clinic setting. Talk to your pediatrician regarding the use of this medicine in children. Special care may be needed. Overdosage: If you think you have taken too much of this medicine contact a poison control center or emergency room at once. NOTE: This medicine is only for you. Do not share this medicine with others. What if I miss a dose? It is important not to miss your dose. Call your doctor or health care professional if you are unable to keep an appointment. What may interact with this medicine? This medicine may interact with the following medications: -other iron products This list may not describe all possible interactions. Give your health care provider a list of all the medicines, herbs, non-prescription drugs, or dietary supplements you use. Also tell them if you smoke, drink alcohol, or use illegal drugs. Some items may interact with your medicine. What should I watch for while using this medicine? Visit your doctor or healthcare professional regularly. Tell  your doctor or healthcare professional if your symptoms do not start to get better or if they get worse. You may need blood work done while you are taking this medicine. You may need to follow a special diet. Talk to your doctor. Foods that contain iron include: whole grains/cereals, dried fruits, beans, or peas, leafy green vegetables, and organ meats (liver, kidney). What side effects may I notice from receiving this medicine? Side effects that you should report to your doctor or health care professional as soon as possible: -allergic reactions like skin rash, itching or hives, swelling of the face, lips, or tongue -breathing problems -changes in blood pressure -feeling faint or lightheaded, falls -fever or chills -flushing, sweating, or hot feelings -swelling of the ankles or feet Side effects that usually do not require medical attention (report to your doctor or health care professional if they continue or are bothersome): -diarrhea -headache -nausea, vomiting -stomach pain This list may not describe all possible side effects. Call your doctor for medical advice about side effects. You may report side effects to FDA at 1-800-FDA-1088. Where should I keep my medicine? This drug is given in a hospital or clinic and will not be stored at home. NOTE: This sheet is a summary. It may not cover all possible information. If you have questions about this medicine, talk to your doctor, pharmacist, or health care provider.  2018 Elsevier/Gold Standard (2015-08-01 12:41:49)    Blood Transfusion, Adult A blood transfusion is a procedure in which you receive donated blood, including plasma, platelets, and red blood cells, through an IV tube. You may need a blood transfusion because   of illness, surgery, or injury. The blood may come from a donor. You may also be able to donate blood for yourself (autologous blood donation) before a surgery if you know that you might require a blood transfusion. The  blood given in a transfusion is made up of different types of cells. You may receive:  Red blood cells. These carry oxygen to the cells in the body.  White blood cells. These help you fight infections.  Platelets. These help your blood to clot.  Plasma. This is the liquid part of your blood and it helps with fluid imbalances.  If you have hemophilia or another clotting disorder, you may also receive other types of blood products. Tell a health care provider about:  Any allergies you have.  All medicines you are taking, including vitamins, herbs, eye drops, creams, and over-the-counter medicines.  Any problems you or family members have had with anesthetic medicines.  Any blood disorders you have.  Any surgeries you have had.  Any medical conditions you have, including any recent fever or cold symptoms.  Whether you are pregnant or may be pregnant.  Any previous reactions you have had during a blood transfusion. What are the risks? Generally, this is a safe procedure. However, problems may occur, including:  Having an allergic reaction to something in the donated blood. Hives and itching may be symptoms of this type of reaction.  Fever. This may be a reaction to the white blood cells in the transfused blood. Nausea or chest pain may accompany a fever.  Iron overload. This can happen from having many transfusions.  Transfusion-related acute lung injury (TRALI). This is a rare reaction that causes lung damage. The cause is not known.TRALI can occur within hours of a transfusion or several days later.  Sudden (acute) or delayed hemolytic reactions. This happens if your blood does not match the cells in your transfusion. Your body's defense system (immune system) may try to attack the new cells. This complication is rare. The symptoms include fever, chills, nausea, and low back pain or chest pain.  Infection or disease transmission. This is rare.  What happens before the  procedure?  You will have a blood test to determine your blood type. This is necessary to know what kind of blood your body will accept and to match it to the donor blood.  If you are going to have a planned surgery, you may be able to do an autologous blood donation. This may be done in case you need to have a transfusion.  If you have had an allergic reaction to a transfusion in the past, you may be given medicine to help prevent a reaction. This medicine may be given to you by mouth or through an IV tube.  You will have your temperature, blood pressure, and pulse monitored before the transfusion.  Follow instructions from your health care provider about eating and drinking restrictions.  Ask your health care provider about: ? Changing or stopping your regular medicines. This is especially important if you are taking diabetes medicines or blood thinners. ? Taking medicines such as aspirin and ibuprofen. These medicines can thin your blood. Do not take these medicines before your procedure if your health care provider instructs you not to. What happens during the procedure?  An IV tube will be inserted into one of your veins.  The bag of donated blood will be attached to your IV tube. The blood will then enter through your vein.  Your temperature, blood   pressure, and pulse will be monitored regularly during the transfusion. This monitoring is done to detect early signs of a transfusion reaction.  If you have any signs or symptoms of a reaction, your transfusion will be stopped and you may be given medicine.  When the transfusion is complete, your IV tube will be removed.  Pressure may be applied to the IV site for a few minutes.  A bandage (dressing) will be applied. The procedure may vary among health care providers and hospitals. What happens after the procedure?  Your temperature, blood pressure, heart rate, breathing rate, and blood oxygen level will be monitored often.  Your  blood may be tested to see how you are responding to the transfusion.  You may be warmed with fluids or blankets to maintain a normal body temperature. Summary  A blood transfusion is a procedure in which you receive donated blood, including plasma, platelets, and red blood cells, through an IV tube.  Your temperature, blood pressure, and pulse will be monitored before, during, and after the transfusion.  Your blood may be tested after the transfusion to see how your body has responded. This information is not intended to replace advice given to you by your health care provider. Make sure you discuss any questions you have with your health care provider. Document Released: 06/26/2000 Document Revised: 03/26/2016 Document Reviewed: 03/26/2016 Elsevier Interactive Patient Education  2018 [employer]  

## 2018-01-05 LAB — TYPE AND SCREEN
ABO/RH(D): O POS
ANTIBODY SCREEN: NEGATIVE
Unit division: 0

## 2018-01-05 LAB — BPAM RBC
BLOOD PRODUCT EXPIRATION DATE: 201907112359
ISSUE DATE / TIME: 201906250812
UNIT TYPE AND RH: 5100

## 2018-01-06 ENCOUNTER — Encounter: Payer: Self-pay | Admitting: Hematology & Oncology

## 2018-01-10 DIAGNOSIS — I639 Cerebral infarction, unspecified: Secondary | ICD-10-CM | POA: Diagnosis not present

## 2018-01-11 ENCOUNTER — Inpatient Hospital Stay: Payer: PPO | Attending: Family

## 2018-01-11 DIAGNOSIS — D509 Iron deficiency anemia, unspecified: Secondary | ICD-10-CM | POA: Insufficient documentation

## 2018-01-22 DIAGNOSIS — J449 Chronic obstructive pulmonary disease, unspecified: Secondary | ICD-10-CM | POA: Diagnosis not present

## 2018-01-24 DIAGNOSIS — E876 Hypokalemia: Secondary | ICD-10-CM | POA: Diagnosis not present

## 2018-01-24 DIAGNOSIS — Z794 Long term (current) use of insulin: Secondary | ICD-10-CM | POA: Diagnosis not present

## 2018-01-24 DIAGNOSIS — Z9181 History of falling: Secondary | ICD-10-CM | POA: Diagnosis not present

## 2018-01-24 DIAGNOSIS — I639 Cerebral infarction, unspecified: Secondary | ICD-10-CM | POA: Diagnosis not present

## 2018-01-24 DIAGNOSIS — I10 Essential (primary) hypertension: Secondary | ICD-10-CM | POA: Diagnosis not present

## 2018-01-24 DIAGNOSIS — Z7984 Long term (current) use of oral hypoglycemic drugs: Secondary | ICD-10-CM | POA: Diagnosis not present

## 2018-01-24 DIAGNOSIS — F5104 Psychophysiologic insomnia: Secondary | ICD-10-CM | POA: Diagnosis not present

## 2018-01-24 DIAGNOSIS — E1149 Type 2 diabetes mellitus with other diabetic neurological complication: Secondary | ICD-10-CM | POA: Diagnosis not present

## 2018-01-24 DIAGNOSIS — Z87891 Personal history of nicotine dependence: Secondary | ICD-10-CM | POA: Diagnosis not present

## 2018-01-24 DIAGNOSIS — E538 Deficiency of other specified B group vitamins: Secondary | ICD-10-CM | POA: Diagnosis not present

## 2018-01-24 DIAGNOSIS — J449 Chronic obstructive pulmonary disease, unspecified: Secondary | ICD-10-CM | POA: Diagnosis not present

## 2018-02-01 ENCOUNTER — Other Ambulatory Visit: Payer: Self-pay

## 2018-02-01 ENCOUNTER — Encounter: Payer: Self-pay | Admitting: Family

## 2018-02-01 ENCOUNTER — Telehealth: Payer: Self-pay | Admitting: *Deleted

## 2018-02-01 ENCOUNTER — Inpatient Hospital Stay: Payer: PPO

## 2018-02-01 ENCOUNTER — Other Ambulatory Visit: Payer: Self-pay | Admitting: *Deleted

## 2018-02-01 ENCOUNTER — Inpatient Hospital Stay (HOSPITAL_BASED_OUTPATIENT_CLINIC_OR_DEPARTMENT_OTHER): Payer: PPO | Admitting: Family

## 2018-02-01 VITALS — BP 109/66

## 2018-02-01 VITALS — BP 132/96 | HR 82 | Temp 98.1°F | Resp 17 | Wt 228.8 lb

## 2018-02-01 DIAGNOSIS — E291 Testicular hypofunction: Secondary | ICD-10-CM | POA: Diagnosis not present

## 2018-02-01 DIAGNOSIS — D631 Anemia in chronic kidney disease: Secondary | ICD-10-CM

## 2018-02-01 DIAGNOSIS — D509 Iron deficiency anemia, unspecified: Secondary | ICD-10-CM

## 2018-02-01 DIAGNOSIS — D649 Anemia, unspecified: Secondary | ICD-10-CM

## 2018-02-01 DIAGNOSIS — N189 Chronic kidney disease, unspecified: Secondary | ICD-10-CM | POA: Diagnosis not present

## 2018-02-01 DIAGNOSIS — D500 Iron deficiency anemia secondary to blood loss (chronic): Secondary | ICD-10-CM

## 2018-02-01 DIAGNOSIS — K769 Liver disease, unspecified: Secondary | ICD-10-CM | POA: Diagnosis not present

## 2018-02-01 LAB — PREPARE RBC (CROSSMATCH)

## 2018-02-01 LAB — CBC WITH DIFFERENTIAL (CANCER CENTER ONLY)
BASOS PCT: 0 %
Basophils Absolute: 0 10*3/uL (ref 0.0–0.1)
EOS ABS: 0.2 10*3/uL (ref 0.0–0.5)
EOS PCT: 4 %
HCT: 23.3 % — ABNORMAL LOW (ref 38.7–49.9)
Hemoglobin: 6.6 g/dL — CL (ref 13.0–17.1)
LYMPHS PCT: 22 %
Lymphs Abs: 1.4 10*3/uL (ref 0.9–3.3)
MCH: 23.3 pg — ABNORMAL LOW (ref 28.0–33.4)
MCHC: 28.3 g/dL — ABNORMAL LOW (ref 32.0–35.9)
MCV: 82.3 fL (ref 82.0–98.0)
MONO ABS: 0.4 10*3/uL (ref 0.1–0.9)
Monocytes Relative: 6 %
Neutro Abs: 4.4 10*3/uL (ref 1.5–6.5)
Neutrophils Relative %: 68 %
PLATELETS: 364 10*3/uL (ref 145–400)
RBC: 2.83 MIL/uL — ABNORMAL LOW (ref 4.20–5.70)
RDW: 19.1 % — AB (ref 11.1–15.7)
WBC Count: 6.4 10*3/uL (ref 4.0–10.0)

## 2018-02-01 LAB — CMP (CANCER CENTER ONLY)
ALT: 14 U/L (ref 0–44)
AST: 22 U/L (ref 15–41)
Albumin: 3.4 g/dL — ABNORMAL LOW (ref 3.5–5.0)
Alkaline Phosphatase: 99 U/L (ref 38–126)
Anion gap: 8 (ref 5–15)
BUN: 16 mg/dL (ref 8–23)
CHLORIDE: 107 mmol/L (ref 98–111)
CO2: 28 mmol/L (ref 22–32)
CREATININE: 1.3 mg/dL — AB (ref 0.61–1.24)
Calcium: 9.8 mg/dL (ref 8.9–10.3)
GFR, EST NON AFRICAN AMERICAN: 57 mL/min — AB (ref >60)
Glucose, Bld: 90 mg/dL (ref 70–99)
POTASSIUM: 3.5 mmol/L (ref 3.5–5.1)
SODIUM: 143 mmol/L (ref 135–145)
Total Bilirubin: 0.2 mg/dL — ABNORMAL LOW (ref 0.3–1.2)
Total Protein: 7.7 g/dL (ref 6.5–8.1)

## 2018-02-01 LAB — IRON AND TIBC
IRON: 10 ug/dL — AB (ref 42–163)
Saturation Ratios: 3 % — ABNORMAL LOW (ref 42–163)
TIBC: 339 ug/dL (ref 202–409)
UIBC: 329 ug/dL

## 2018-02-01 LAB — RETICULOCYTES
RBC.: 2.9 MIL/uL — ABNORMAL LOW (ref 4.20–5.82)
RETIC CT PCT: 2.7 % — AB (ref 0.8–1.8)
Retic Count, Absolute: 78.3 10*3/uL (ref 34.8–93.9)

## 2018-02-01 LAB — FERRITIN: FERRITIN: 25 ng/mL (ref 24–336)

## 2018-02-01 LAB — SAMPLE TO BLOOD BANK

## 2018-02-01 MED ORDER — SODIUM CHLORIDE 0.9 % IV SOLN
INTRAVENOUS | Status: DC
  Administered 2018-02-01: 10:00:00 via INTRAVENOUS

## 2018-02-01 MED ORDER — SODIUM CHLORIDE 0.9 % IV SOLN
510.0000 mg | Freq: Once | INTRAVENOUS | Status: AC
  Administered 2018-02-01: 510 mg via INTRAVENOUS
  Filled 2018-02-01: qty 17

## 2018-02-01 NOTE — Progress Notes (Addendum)
Hematology and Oncology Follow Up Visit  James Zuniga [id number] [birth year] 62 y.o. 02/01/2018   Principle Diagnosis:  Iron deficiency anemia Hypotestosteronemia Chronic liver lesions  Of Note: History of stroke in June 2018, no more Testosterone or ESA's   Current Therapy:   IV iron as indicated - last received in June 2019   Interim History:  James Zuniga is here today with his wife for follow-up. He is symptomatic with fatigue, weakness, SOB with exertion, occasional palpitations and swelling in his ankles.  Hgb is down to 6.6, MCV 82.  He has also had 6 falls at home where he said his leg went weak and he fell backwards.  He has not noted any episodes of bleeding, no bruising or petechiae. We will check his stool for blood.  No fever, chills, n/v, cough, rash, chest pain, abdominal pain or changes in bowel or bladder habits.  The numbness and tingling in his hands and lower extremities is unchanged.  His appetite is good and he is trying to stay hydrated. His weight is stable.   ECOG Performance Status: 1 - Symptomatic but completely ambulatory  Medications:  Allergies as of 02/01/2018   No Known Allergies     Medication List        Accurate as of 02/01/18  9:37 AM. Always use your most recent med list.          albuterol (2.5 MG/3ML) 0.083% nebulizer solution Commonly known as:  PROVENTIL Take 2.5 mg by nebulization every 6 (six) hours as needed for wheezing or shortness of breath.   allopurinol 300 MG tablet Commonly known as:  ZYLOPRIM Take 300 mg by mouth daily.   amLODipine 5 MG tablet Commonly known as:  NORVASC Take 5 mg by mouth daily.   aspirin 81 MG EC tablet Take 81 mg by mouth daily. Swallow whole.   atorvastatin 80 MG tablet Commonly known as:  LIPITOR Take 1 tablet (80 mg total) by mouth every morning.   clopidogrel 75 MG tablet Commonly known as:  PLAVIX Take 1 tablet (75 mg total) by mouth daily.   DULoxetine 60 MG capsule Commonly known  as:  CYMBALTA Take 60 mg by mouth daily.   furosemide 20 MG tablet Commonly known as:  LASIX Take 20 mg by mouth 2 (two) times a week.   gabapentin 300 MG capsule Commonly known as:  NEURONTIN Take 300 mg by mouth 4 (four) times daily.   insulin NPH-regular Human (70-30) 100 UNIT/ML injection Commonly known as:  NOVOLIN 70/30 Please inject 25 units twice daily before breakfast and dinner.   ipratropium-albuterol 0.5-2.5 (3) MG/3ML Soln Commonly known as:  DUONEB Take 3 mLs by nebulization every 6 (six) hours as needed. DX: J44.9   losartan 100 MG tablet Commonly known as:  COZAAR Take 100 mg by mouth daily.   metoprolol succinate 25 MG 24 hr tablet Commonly known as:  TOPROL-XL Take 1 tablet (25 mg total) by mouth daily.   OXYGEN Inhale 2 L into the lungs as needed.   pantoprazole 20 MG tablet Commonly known as:  PROTONIX Take 1 tablet by mouth daily.   potassium chloride SA 20 MEQ tablet Commonly known as:  K-DUR,KLOR-CON Take 1 tablet (20 mEq total) by mouth 2 (two) times daily.   primidone 50 MG tablet Commonly known as:  MYSOLINE Take 100 mg by mouth at bedtime.   tamsulosin 0.4 MG Caps capsule Commonly known as:  FLOMAX Take 1 capsule (0.4 mg total) by mouth  daily after breakfast.   tiZANidine 4 MG tablet Commonly known as:  ZANAFLEX 4 mg 3 (three) times daily as needed for muscle spasms.   traMADol 50 MG tablet Commonly known as:  ULTRAM Take 50 mg by mouth daily as needed for moderate pain.   zolpidem 10 MG tablet Commonly known as:  AMBIEN Take 10 mg by mouth at bedtime.       Allergies: No Known Allergies  Past Medical History, Surgical history, Social history, and Family History were reviewed and updated.  Review of Systems: All other 10 point review of systems is negative.   Physical Exam:  vitals were not taken for this visit.   Wt Readings from Last 3 Encounters:  01/03/18 227 lb (103 kg)  11/22/17 226 lb (102.5 kg)  10/11/17 226  lb (102.5 kg)    Ocular: Sclerae unicteric, pupils equal, round and reactive to light Ear-nose-throat: Oropharynx clear, dentition fair Lymphatic: No cervical, supraclavicular or axillary adenopathy Lungs no rales or rhonchi, good excursion bilaterally Heart regular rate and rhythm, no murmur appreciated Abd soft, nontender, positive bowel sounds, no liver or spleen tip palpated on exam, no fluid wave  MSK no focal spinal tenderness, no joint edema Neuro: non-focal, well-oriented, appropriate affect Breasts: Deferred   Lab Results  Component Value Date   WBC 6.4 02/01/2018   HGB 6.6 (LL) 02/01/2018   HCT 23.3 (L) 02/01/2018   MCV 82.3 02/01/2018   PLT 364 02/01/2018   Lab Results  Component Value Date   FERRITIN 35 01/03/2018   IRON 20 (L) 01/03/2018   TIBC 333 01/03/2018   UIBC 312 01/03/2018   IRONPCTSAT 6 (L) 01/03/2018   Lab Results  Component Value Date   RETICCTPCT 3.4 (H) 01/03/2018   RBC 2.83 (L) 02/01/2018   RETICCTABS 63.0 06/20/2015   No results found for: Nils Pyle [facility] Lab Results  Component Value Date   IGA 139 08/26/2017   No results found for: Odetta Pink, SPEI   Chemistry      Component Value Date/Time   NA 140 01/03/2018 0802   NA 141 09/25/2016 0809   K 3.3 (L) 01/03/2018 0802   K 2.8 (LL) 09/25/2016 0809   CL 102 01/03/2018 0802   CL 102 07/31/2016 0815   CO2 26 01/03/2018 0802   CO2 28 09/25/2016 0809   BUN 17 01/03/2018 0802   BUN 13.1 09/25/2016 0809   CREATININE 1.31 (H) 01/03/2018 0802   CREATININE 1.2 09/25/2016 0809      Component Value Date/Time   CALCIUM 9.9 01/03/2018 0802   CALCIUM 9.5 09/25/2016 0809   ALKPHOS 95 01/03/2018 0802   ALKPHOS 126 09/25/2016 0809   AST 26 01/03/2018 0802   AST 19 09/25/2016 0809   ALT 18 01/03/2018 0802   ALT 23 09/25/2016 0809   BILITOT 0.3 01/03/2018 0802   BILITOT 0.45 09/25/2016 0809      Impression and  Plan: James Zuniga is a very pleasant 62 yo African American gentleman with 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 He is symptomatic as mentioned above with Hgb of 6.6. He is not in any visible distress at this time.  We gave him iron today and will give a second dose next week.  We will give him blood on Thursday. He is in agreement with the plan.  He was given supplies and will bring in a stool sample to test for occult blood.  We will plan to  see him back in another month for follow-up.  They will contact our office with any questions or concerns and will go to the ED in the event of an emergency.   Laverna Peace, NP 7/23/20199:37 AM    Addendum: labs including heme positive stool result sent to GI Dr. Darci Current for review and further work-up if needed.

## 2018-02-01 NOTE — Patient Instructions (Signed)

## 2018-02-01 NOTE — Progress Notes (Signed)
Pt instructed to keep blue blood bracelet on for blood transfusion on Thursday.  Teach back done.

## 2018-02-01 NOTE — Telephone Encounter (Signed)
Critical Value Hgb 6.6 Laverna Peace NP notified. Orders will be placed.

## 2018-02-03 ENCOUNTER — Inpatient Hospital Stay: Payer: PPO

## 2018-02-03 DIAGNOSIS — D509 Iron deficiency anemia, unspecified: Secondary | ICD-10-CM | POA: Diagnosis not present

## 2018-02-03 DIAGNOSIS — D649 Anemia, unspecified: Secondary | ICD-10-CM

## 2018-02-03 DIAGNOSIS — D500 Iron deficiency anemia secondary to blood loss (chronic): Secondary | ICD-10-CM

## 2018-02-03 MED ORDER — ACETAMINOPHEN 325 MG PO TABS
650.0000 mg | ORAL_TABLET | Freq: Once | ORAL | Status: AC
  Administered 2018-02-03: 650 mg via ORAL

## 2018-02-03 MED ORDER — SODIUM CHLORIDE 0.9% IV SOLUTION
250.0000 mL | Freq: Once | INTRAVENOUS | Status: AC
  Administered 2018-02-03: 250 mL via INTRAVENOUS
  Filled 2018-02-03: qty 250

## 2018-02-03 MED ORDER — SODIUM CHLORIDE 0.9% FLUSH
10.0000 mL | INTRAVENOUS | Status: DC | PRN
  Filled 2018-02-03: qty 10

## 2018-02-03 MED ORDER — ACETAMINOPHEN 325 MG PO TABS
ORAL_TABLET | ORAL | Status: AC
  Filled 2018-02-03: qty 2

## 2018-02-03 MED ORDER — DIPHENHYDRAMINE HCL 25 MG PO CAPS
ORAL_CAPSULE | ORAL | Status: AC
  Filled 2018-02-03: qty 1

## 2018-02-03 MED ORDER — FUROSEMIDE 10 MG/ML IJ SOLN: 20.0000 mg | Freq: Once | INTRAMUSCULAR | Status: DC

## 2018-02-03 MED ORDER — DIPHENHYDRAMINE HCL 25 MG PO CAPS
25.0000 mg | ORAL_CAPSULE | Freq: Once | ORAL | Status: AC
  Administered 2018-02-03: 25 mg via ORAL

## 2018-02-03 NOTE — Patient Instructions (Signed)

## 2018-02-04 LAB — TYPE AND SCREEN
ABO/RH(D): O POS
ANTIBODY SCREEN: NEGATIVE
UNIT DIVISION: 0
Unit division: 0

## 2018-02-04 LAB — BPAM RBC
Blood Product Expiration Date: 201908172359
Blood Product Expiration Date: 201908172359
ISSUE DATE / TIME: 201907250813
ISSUE DATE / TIME: 201907250813
UNIT TYPE AND RH: 5100
Unit Type and Rh: 5100

## 2018-02-08 ENCOUNTER — Inpatient Hospital Stay: Payer: PPO

## 2018-02-08 ENCOUNTER — Other Ambulatory Visit: Payer: Self-pay | Admitting: Family

## 2018-02-08 VITALS — BP 127/84 | HR 63 | Temp 98.7°F

## 2018-02-08 DIAGNOSIS — D509 Iron deficiency anemia, unspecified: Secondary | ICD-10-CM

## 2018-02-08 DIAGNOSIS — E349 Endocrine disorder, unspecified: Secondary | ICD-10-CM

## 2018-02-08 MED ORDER — HEPARIN SOD (PORK) LOCK FLUSH 100 UNIT/ML IV SOLN
250.0000 [IU] | Freq: Once | INTRAVENOUS | Status: DC | PRN
  Filled 2018-02-08: qty 5

## 2018-02-08 MED ORDER — SODIUM CHLORIDE 0.9% FLUSH
3.0000 mL | Freq: Once | INTRAVENOUS | Status: DC | PRN
  Filled 2018-02-08: qty 10

## 2018-02-08 MED ORDER — ALTEPLASE 2 MG IJ SOLR
2.0000 mg | Freq: Once | INTRAMUSCULAR | Status: DC | PRN
  Filled 2018-02-08: qty 2

## 2018-02-08 MED ORDER — TESTOSTERONE CYPIONATE 200 MG/ML IM SOLN: 300.0000 mg | INTRAMUSCULAR | Status: DC

## 2018-02-08 MED ORDER — SODIUM CHLORIDE 0.9% FLUSH
10.0000 mL | INTRAVENOUS | Status: DC | PRN
  Filled 2018-02-08: qty 10

## 2018-02-08 MED ORDER — HEPARIN SOD (PORK) LOCK FLUSH 100 UNIT/ML IV SOLN
500.0000 [IU] | Freq: Once | INTRAVENOUS | Status: DC | PRN
  Filled 2018-02-08: qty 5

## 2018-02-08 MED ORDER — SODIUM CHLORIDE 0.9 % IV SOLN
Freq: Once | INTRAVENOUS | Status: AC
  Administered 2018-02-08: 10:00:00 via INTRAVENOUS
  Filled 2018-02-08: qty 250

## 2018-02-08 MED ORDER — SODIUM CHLORIDE 0.9 % IV SOLN
510.0000 mg | Freq: Once | INTRAVENOUS | Status: AC
  Administered 2018-02-08: 510 mg via INTRAVENOUS
  Filled 2018-02-08: qty 17

## 2018-02-08 NOTE — Progress Notes (Signed)
Patient stayed for 15 mins post feraheme, felt well and declined to stay for 30 mins.

## 2018-02-08 NOTE — Patient Instructions (Signed)

## 2018-02-09 ENCOUNTER — Inpatient Hospital Stay: Payer: PPO

## 2018-02-09 ENCOUNTER — Other Ambulatory Visit: Payer: Self-pay | Admitting: Family

## 2018-02-09 DIAGNOSIS — D500 Iron deficiency anemia secondary to blood loss (chronic): Secondary | ICD-10-CM

## 2018-02-09 DIAGNOSIS — D509 Iron deficiency anemia, unspecified: Secondary | ICD-10-CM | POA: Diagnosis not present

## 2018-02-10 LAB — OCCULT BLOOD X 1 CARD TO LAB, STOOL
FECAL OCCULT BLD: POSITIVE — AB
Fecal Occult Bld: POSITIVE — AB
Fecal Occult Bld: POSITIVE — AB

## 2018-02-18 DIAGNOSIS — J449 Chronic obstructive pulmonary disease, unspecified: Secondary | ICD-10-CM | POA: Diagnosis not present

## 2018-02-22 DIAGNOSIS — J449 Chronic obstructive pulmonary disease, unspecified: Secondary | ICD-10-CM | POA: Diagnosis not present

## 2018-02-24 ENCOUNTER — Telehealth: Payer: Self-pay | Admitting: Internal Medicine

## 2018-02-24 DIAGNOSIS — E538 Deficiency of other specified B group vitamins: Secondary | ICD-10-CM | POA: Diagnosis not present

## 2018-02-24 NOTE — Telephone Encounter (Signed)
Received fax from [place] stating they are needing current office notes showing the need for oxygen and also current oxygen saturation teting.  Looking thorugh pt's chart, last time pt was seen at office was 08/04/17.  At this visit, pt was not walked and nothing was documented in here about pt's O2.  We have that pt only wears O2 at night.  Attempted to call pt to see if we could get him scheduled an appt tomorrow, Friday, 02/25/18.  Left a message for pt to return call x1.

## 2018-03-01 ENCOUNTER — Inpatient Hospital Stay: Payer: PPO | Attending: Family | Admitting: Family

## 2018-03-01 ENCOUNTER — Inpatient Hospital Stay: Payer: PPO

## 2018-03-01 ENCOUNTER — Other Ambulatory Visit: Payer: Self-pay

## 2018-03-01 VITALS — BP 125/83 | HR 68 | Temp 98.1°F | Resp 18 | Wt 228.5 lb

## 2018-03-01 DIAGNOSIS — D649 Anemia, unspecified: Secondary | ICD-10-CM

## 2018-03-01 DIAGNOSIS — D500 Iron deficiency anemia secondary to blood loss (chronic): Secondary | ICD-10-CM

## 2018-03-01 DIAGNOSIS — N189 Chronic kidney disease, unspecified: Secondary | ICD-10-CM

## 2018-03-01 DIAGNOSIS — Z8673 Personal history of transient ischemic attack (TIA), and cerebral infarction without residual deficits: Secondary | ICD-10-CM | POA: Diagnosis not present

## 2018-03-01 DIAGNOSIS — E291 Testicular hypofunction: Secondary | ICD-10-CM | POA: Insufficient documentation

## 2018-03-01 DIAGNOSIS — D631 Anemia in chronic kidney disease: Secondary | ICD-10-CM | POA: Insufficient documentation

## 2018-03-01 DIAGNOSIS — D509 Iron deficiency anemia, unspecified: Secondary | ICD-10-CM | POA: Diagnosis not present

## 2018-03-01 DIAGNOSIS — K769 Liver disease, unspecified: Secondary | ICD-10-CM | POA: Insufficient documentation

## 2018-03-01 LAB — CMP (CANCER CENTER ONLY)
ALT: 23 U/L (ref 10–47)
ANION GAP: 1 — AB (ref 5–15)
AST: 23 U/L (ref 11–38)
Albumin: 3.3 g/dL — ABNORMAL LOW (ref 3.5–5.0)
Alkaline Phosphatase: 99 U/L — ABNORMAL HIGH (ref 26–84)
BUN: 10 mg/dL (ref 7–22)
CO2: 32 mmol/L (ref 18–33)
Calcium: 9.5 mg/dL (ref 8.0–10.3)
Chloride: 101 mmol/L (ref 98–108)
Creatinine: 1.4 mg/dL — ABNORMAL HIGH (ref 0.60–1.20)
GLUCOSE: 81 mg/dL (ref 73–118)
POTASSIUM: 3.1 mmol/L — AB (ref 3.3–4.7)
Sodium: 134 mmol/L (ref 128–145)
Total Bilirubin: 0.5 mg/dL (ref 0.2–1.6)
Total Protein: 7.5 g/dL (ref 6.4–8.1)

## 2018-03-01 LAB — CBC WITH DIFFERENTIAL (CANCER CENTER ONLY)
BASOS ABS: 0 10*3/uL (ref 0.0–0.1)
Basophils Relative: 0 %
EOS PCT: 4 %
Eosinophils Absolute: 0.2 10*3/uL (ref 0.0–0.5)
HCT: 31.5 % — ABNORMAL LOW (ref 38.7–49.9)
Hemoglobin: 9.4 g/dL — ABNORMAL LOW (ref 13.0–17.1)
LYMPHS ABS: 1.7 10*3/uL (ref 0.9–3.3)
LYMPHS PCT: 30 %
MCH: 25.7 pg — ABNORMAL LOW (ref 28.0–33.4)
MCHC: 29.8 g/dL — AB (ref 32.0–35.9)
MCV: 86.1 fL (ref 82.0–98.0)
MONO ABS: 0.5 10*3/uL (ref 0.1–0.9)
Monocytes Relative: 8 %
Neutro Abs: 3.3 10*3/uL (ref 1.5–6.5)
Neutrophils Relative %: 58 %
PLATELETS: 337 10*3/uL (ref 145–400)
RBC: 3.66 MIL/uL — ABNORMAL LOW (ref 4.20–5.70)
RDW: 18.8 % — ABNORMAL HIGH (ref 11.1–15.7)
WBC Count: 5.7 10*3/uL (ref 4.0–10.0)

## 2018-03-01 LAB — IRON AND TIBC
Iron: 37 ug/dL — ABNORMAL LOW (ref 42–163)
SATURATION RATIOS: 12 % — AB (ref 42–163)
TIBC: 299 ug/dL (ref 202–409)
UIBC: 262 ug/dL

## 2018-03-01 LAB — FERRITIN: Ferritin: 87 ng/mL (ref 24–336)

## 2018-03-01 LAB — SAMPLE TO BLOOD BANK

## 2018-03-01 LAB — RETICULOCYTES
RBC.: 3.66 MIL/uL — AB (ref 4.20–5.82)
RETIC COUNT ABSOLUTE: 95.2 10*3/uL — AB (ref 34.8–93.9)
Retic Ct Pct: 2.6 % — ABNORMAL HIGH (ref 0.8–1.8)

## 2018-03-01 NOTE — Progress Notes (Signed)
Hematology and Oncology Follow Up Visit  DOY TAAFFE [id number] 07-Jan-1956 62 y.o. 03/01/2018   Principle Diagnosis:  Iron deficiency anemia Hypotestosteronemia Chronic liver lesions  Of Note: History of stroke in June 2018, no more Testosterone or ESA's  Current Therapy:   IV iron as indicated- last received in July 2019 x 2   Interim History:  Mr. Calabretta is here today with his wife for follow-up. He received 2 doses of IV iron and 2 units of blood in July and has responded nicely. He still has some fatigue and weakness.  He has not noted any blood in his stool this week. His stool last week was positive for blood so we will get him into see GI for further work up.  PCP is now giving him B 12 weekly for 4 weeks.  His baseline SOB is stable.  No fever, chills, n/v, cough, rash, dizziness, chest pain, palpitations or changes in bowel or bladder habits. He has occasional episodes of abdominal discomfort that come and go No bruising or petechiae. No lymphadenopathy noted on exam.  The swelling in his lower extremities waxes and wanes. He takes his lasix as prescribed to help manage this.  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  He uses his cane when ambulating for support. No new falls to report. No syncopal episodes.  He has maintained a good appetite but admits that he needs to hydrate better. His weight is stable.   ECOG Performance Status: 1 - Symptomatic but completely ambulatory  Medications:  Allergies as of 03/01/2018   No Known Allergies     Medication List        Accurate as of 03/01/18  8:51 AM. Always use your most recent med list.          albuterol (2.5 MG/3ML) 0.083% nebulizer solution Commonly known as:  PROVENTIL Take 2.5 mg by nebulization every 6 (six) hours as needed for wheezing or shortness of breath.   allopurinol 300 MG tablet Commonly known as:  ZYLOPRIM Take 300 mg by mouth daily.   amLODipine 5 MG tablet Commonly known as:   NORVASC Take 5 mg by mouth daily.   aspirin 81 MG EC tablet Take 81 mg by mouth daily. Swallow whole.   atorvastatin 80 MG tablet Commonly known as:  LIPITOR Take 1 tablet (80 mg total) by mouth every morning.   clopidogrel 75 MG tablet Commonly known as:  PLAVIX Take 1 tablet (75 mg total) by mouth daily.   DULoxetine 60 MG capsule Commonly known as:  CYMBALTA Take 60 mg by mouth daily.   furosemide 20 MG tablet Commonly known as:  LASIX Take 20 mg by mouth 2 (two) times a week.   gabapentin 300 MG capsule Commonly known as:  NEURONTIN Take 300 mg by mouth 4 (four) times daily.   insulin NPH-regular Human (70-30) 100 UNIT/ML injection Commonly known as:  NOVOLIN 70/30 Please inject 25 units twice daily before breakfast and dinner.   ipratropium-albuterol 0.5-2.5 (3) MG/3ML Soln Commonly known as:  DUONEB Take 3 mLs by nebulization every 6 (six) hours as needed. DX: J44.9   losartan 100 MG tablet Commonly known as:  COZAAR Take 100 mg by mouth daily.   metoprolol succinate 25 MG 24 hr tablet Commonly known as:  TOPROL-XL Take 1 tablet (25 mg total) by mouth daily.   OXYGEN Inhale 2 L into the lungs as needed.   pantoprazole 20 MG tablet Commonly known as:  PROTONIX Take 1 tablet  by mouth daily.   potassium chloride SA 20 MEQ tablet Commonly known as:  K-DUR,KLOR-CON Take 1 tablet (20 mEq total) by mouth 2 (two) times daily.   primidone 50 MG tablet Commonly known as:  MYSOLINE Take 100 mg by mouth at bedtime.   tamsulosin 0.4 MG Caps capsule Commonly known as:  FLOMAX Take 1 capsule (0.4 mg total) by mouth daily after breakfast.   tiZANidine 4 MG tablet Commonly known as:  ZANAFLEX 4 mg 3 (three) times daily as needed for muscle spasms.   traMADol 50 MG tablet Commonly known as:  ULTRAM Take 50 mg by mouth daily as needed for moderate pain.   VITAMIN B-12 IJ Inject 1,000 mg as directed once a week. X 4   zolpidem 10 MG tablet Commonly known as:   AMBIEN Take 10 mg by mouth at bedtime.       Allergies: No Known Allergies  Past Medical History, Surgical history, Social history, and Family History were reviewed and updated.  Review of Systems: All other 10 point review of systems is negative.   Physical Exam:  weight is 228 lb 8 oz (103.6 kg). His oral temperature is 98.1 F (36.7 C). His blood pressure is 125/83 and his pulse is 68. His respiration is 18 and oxygen saturation is 100%.   Wt Readings from Last 3 Encounters:  03/01/18 228 lb 8 oz (103.6 kg)  02/01/18 228 lb 12 oz (103.8 kg)  01/03/18 227 lb (103 kg)    Ocular: Sclerae unicteric, pupils equal, round and reactive to light Ear-nose-throat: Oropharynx clear, dentition fair Lymphatic: No cervical, supraclavicular or axillary adenopathy Lungs no rales or rhonchi, good excursion bilaterally Heart regular rate and rhythm, no murmur appreciated Abd soft, nontender, positive bowel sounds, no liver or spleen tip palpated on exam, no fluid wave  MSK no focal spinal tenderness, no joint edema Neuro: non-focal, well-oriented, appropriate affect Breasts: Deferred   Lab Results  Component Value Date   WBC 5.7 03/01/2018   HGB 9.4 (L) 03/01/2018   HCT 31.5 (L) 03/01/2018   MCV 86.1 03/01/2018   PLT 337 03/01/2018   Lab Results  Component Value Date   FERRITIN 25 02/01/2018   IRON 10 (L) 02/01/2018   TIBC 339 02/01/2018   UIBC 329 02/01/2018   IRONPCTSAT 3 (L) 02/01/2018   Lab Results  Component Value Date   RETICCTPCT 2.7 (H) 02/01/2018   RBC 3.66 (L) 03/01/2018   RETICCTABS 63.0 06/20/2015   No results found for: Nils Pyle [facility] Lab Results  Component Value Date   IGA 139 08/26/2017   No results found for: Odetta Pink, SPEI   Chemistry      Component Value Date/Time   NA 143 02/01/2018 0859   NA 141 09/25/2016 0809   K 3.5 02/01/2018 0859   K 2.8 (LL) 09/25/2016 0809    CL 107 02/01/2018 0859   CL 102 07/31/2016 0815   CO2 28 02/01/2018 0859   CO2 28 09/25/2016 0809   BUN 16 02/01/2018 0859   BUN 13.1 09/25/2016 0809   CREATININE 1.30 (H) 02/01/2018 0859   CREATININE 1.2 09/25/2016 0809      Component Value Date/Time   CALCIUM 9.8 02/01/2018 0859   CALCIUM 9.5 09/25/2016 0809   ALKPHOS 99 02/01/2018 0859   ALKPHOS 126 09/25/2016 0809   AST 22 02/01/2018 0859   AST 19 09/25/2016 0809   ALT 14 02/01/2018 0859   ALT 23 09/25/2016  0809   BILITOT 0.2 (L) 02/01/2018 0859   BILITOT 0.45 09/25/2016 0809      Impression and Plan: Mr. Douville is a very pleasant 62 yo African American gentleman with 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 He is feeling better since receiving IV iron and blood last month. His counts have also improved.  We will see what his iron studies show and bring him back in for infusion if needed.  GI referral placed.  We will plan to see him back in another month for follow-up.  They will contact our office with any questions or concerns. We can certainly see him sooner if need be.   Laverna Peace, NP 8/20/20198:51 AM

## 2018-03-03 DIAGNOSIS — E538 Deficiency of other specified B group vitamins: Secondary | ICD-10-CM | POA: Diagnosis not present

## 2018-03-07 ENCOUNTER — Encounter: Payer: Self-pay | Admitting: Gastroenterology

## 2018-03-08 ENCOUNTER — Inpatient Hospital Stay: Payer: PPO

## 2018-03-08 ENCOUNTER — Other Ambulatory Visit: Payer: Self-pay

## 2018-03-08 VITALS — BP 120/75 | HR 60 | Temp 98.0°F

## 2018-03-08 DIAGNOSIS — D509 Iron deficiency anemia, unspecified: Secondary | ICD-10-CM

## 2018-03-08 MED ORDER — SODIUM CHLORIDE 0.9 % IV SOLN
Freq: Once | INTRAVENOUS | Status: AC
  Administered 2018-03-08: 09:00:00 via INTRAVENOUS
  Filled 2018-03-08: qty 250

## 2018-03-08 MED ORDER — SODIUM CHLORIDE 0.9 % IV SOLN
510.0000 mg | Freq: Once | INTRAVENOUS | Status: AC
  Administered 2018-03-08: 510 mg via INTRAVENOUS
  Filled 2018-03-08: qty 17

## 2018-03-08 NOTE — Patient Instructions (Signed)

## 2018-03-10 DIAGNOSIS — E538 Deficiency of other specified B group vitamins: Secondary | ICD-10-CM | POA: Diagnosis not present

## 2018-03-16 DIAGNOSIS — J449 Chronic obstructive pulmonary disease, unspecified: Secondary | ICD-10-CM | POA: Diagnosis not present

## 2018-03-17 DIAGNOSIS — Z23 Encounter for immunization: Secondary | ICD-10-CM | POA: Diagnosis not present

## 2018-03-17 DIAGNOSIS — E538 Deficiency of other specified B group vitamins: Secondary | ICD-10-CM | POA: Diagnosis not present

## 2018-03-18 ENCOUNTER — Encounter: Payer: Self-pay | Admitting: Gastroenterology

## 2018-03-18 ENCOUNTER — Other Ambulatory Visit: Payer: Self-pay

## 2018-03-18 ENCOUNTER — Ambulatory Visit (INDEPENDENT_AMBULATORY_CARE_PROVIDER_SITE_OTHER): Payer: PPO | Admitting: Gastroenterology

## 2018-03-18 VITALS — BP 132/82 | HR 76 | Ht 66.0 in | Wt 231.8 lb

## 2018-03-18 DIAGNOSIS — D509 Iron deficiency anemia, unspecified: Secondary | ICD-10-CM | POA: Diagnosis not present

## 2018-03-18 DIAGNOSIS — D1803 Hemangioma of intra-abdominal structures: Secondary | ICD-10-CM

## 2018-03-18 NOTE — Progress Notes (Signed)
Chief Complaint: Iron deficiency anemia   Referring Provider:   Vergia Alcon Holli Humbles, NP    HPI:     James Zuniga is a 62 y.o. male referred to the [facility] for evaluation of IDA.  He has a long-standing history of IDA since 2014, and has been following with hematology with iron infusions in 2015.  He had subsequent normalization of hemoglobin/hematocrit and iron indices through June 2018, but was admitted earlier this year in February 2019 with fatigue, FOBT positive stools, iron deficiency anemia, requiring blood transfusion.  EGD at that time only notable for a small antral hyperplastic polyp, and colonoscopy without any evidence of recent or active bleeding.  He is continue to follow in hematology clinic with regular iron infusions and PRBC transfusion in July.  Today he states that he has intermittent melenic stool, to include melena with his recent FOBT positive stool submission.  His most recent hemoglobin/hematocrit was 9.4/31.5 with MCV/RDW 86/18.8.  Iron panel in August with ferritin 87 (up from 25 in July) and iron 37, TIBC 299, iron sat 12% (up from 3% in July).  He states that he continues to have fatigue which improves with iron infusions and/or blood transfusions.  He denies any hematochezia, and no nausea, vomiting, fever, chills, weight loss.  His diet consists of fish and chicken, but avoids red meat and pork and very minimal vegetable consumption.  Of note he had an MRI abdomen in 2015 which was notable for multiple hepatic cysts, multiple likely hemangiomas, mild chronic pancreatitis, and suspicion of hemosiderosis.  He had a previous biopsy in 2014 which confirmed hemangioma.  Radiologist recommended repeat in 3 to 6 months to evaluate for stability.  PET scan in 2015 was negative.  No repeat liver imaging to review today.  He otherwise has no known history of hepatobiliary disease.  Recent FOBT positive stools 3/3.   Past Medical History:    Diagnosis Date  . Arthritis    "all over" (08/24/2017)  . CHF (congestive heart failure) ([place])   . Chronic airway obstruction ([place])   . Chronic lower back pain   . Coronary artery disease   . Disorder of liver    "spots on my liver; they've rechecked them; they haven't changed" (08/24/2017)  . Esophageal reflux   . Gout    "on daily RX" (08/24/2017)  . Hyperlipidemia   . Hypertension   . Hypokalemia 11/2016  . Hypotestosteronism 07/01/2013  . Insomnia   . Iron deficiency anemia, unspecified 07/01/2013  . Memory loss   . On home oxygen therapy    "2L prn" (08/24/2017)  . OSA on CPAP   . PVD (peripheral vascular disease) ([place])   . Renal disorder   . Stroke [facility]) 01/2017   "left sided weakness remains" (08/24/2017)  . Type II diabetes mellitus ([place])   . Vitamin D deficiency      Past Surgical History:  Procedure Laterality Date  . COLONOSCOPY N/A 08/26/2017   Procedure: COLONOSCOPY;  Surgeon: Gatha Mayer, MD;  Location: [facility] ENDOSCOPY;  Service: Endoscopy;  Laterality: N/A;  . ESOPHAGOGASTRODUODENOSCOPY N/A 08/26/2017   Procedure: ESOPHAGOGASTRODUODENOSCOPY (EGD);  Surgeon: Gatha Mayer, MD;  Location: [facility] ENDOSCOPY;  Service: Endoscopy;  Laterality: N/A;  . KNEE ARTHROSCOPY Left   . LOOP RECORDER IMPLANT  06/2017  . TEE WITHOUT CARDIOVERSION N/A 12/24/2016   Procedure: TRANSESOPHAGEAL ECHOCARDIOGRAM (TEE);  Surgeon: Fay Records, MD;  Location:  [facility] ENDOSCOPY;  Service: Cardiovascular;  Laterality: N/A;   Family History  Problem Relation Age of Onset  . Heart disease Mother   . Diabetes Father   . Stroke Brother   . Stroke Paternal Uncle   . Diabetes Sister   . Heart attack Brother   . Heart attack Brother    Social History   Tobacco Use  . Smoking status: Former Smoker    Packs/day: 0.50    Years: 40.00    Pack years: 20.00    Types: Cigarettes    Start date: 08/05/1983    Last attempt to quit: 08/15/2017    Years since quitting: 0.5  . Smokeless tobacco: Never  Used  Substance Use Topics  . Alcohol use: No    Alcohol/week: 0.0 standard drinks  . Drug use: No   Current Outpatient Medications  Medication Sig Dispense Refill  . albuterol (PROVENTIL) (2.5 MG/3ML) 0.083% nebulizer solution Take 2.5 mg by nebulization every 6 (six) hours as needed for wheezing or shortness of breath.    . allopurinol (ZYLOPRIM) 300 MG tablet Take 300 mg by mouth daily.    Marland Kitchen amLODipine (NORVASC) 5 MG tablet Take 5 mg by mouth daily.     Marland Kitchen aspirin 81 MG EC tablet Take 81 mg by mouth daily. Swallow whole.    Marland Kitchen atorvastatin (LIPITOR) 80 MG tablet Take 1 tablet (80 mg total) by mouth every morning. 30 tablet 0  . clopidogrel (PLAVIX) 75 MG tablet Take 1 tablet (75 mg total) by mouth daily. 30 tablet 2  . Cyanocobalamin (VITAMIN B-12 IJ) Inject 1,000 mg as directed once a week. X 4    . DULoxetine (CYMBALTA) 60 MG capsule Take 60 mg by mouth daily.    . furosemide (LASIX) 20 MG tablet Take 20 mg by mouth 2 (two) times a week.     . gabapentin (NEURONTIN) 300 MG capsule Take 300 mg by mouth 4 (four) times daily.     . insulin NPH-regular Human (NOVOLIN 70/30) (70-30) 100 UNIT/ML injection Please inject 25 units twice daily before breakfast and dinner. (Patient taking differently: Please inject 30 units with breakfast & 25 units with dinner.) 10 mL 3  . ipratropium-albuterol (DUONEB) 0.5-2.5 (3) MG/3ML SOLN Take 3 mLs by nebulization every 6 (six) hours as needed. DX: J44.9 360 mL 5  . losartan (COZAAR) 100 MG tablet Take 100 mg by mouth daily.     . metoprolol succinate (TOPROL-XL) 25 MG 24 hr tablet Take 1 tablet (25 mg total) by mouth daily. (Patient taking differently: Take 25 mg by mouth 2 (two) times daily. ) 60 tablet 2  . OXYGEN Inhale 2 L into the lungs as needed.    . pantoprazole (PROTONIX) 20 MG tablet Take 1 tablet by mouth daily.    . potassium chloride SA (K-DUR,KLOR-CON) 20 MEQ tablet Take 1 tablet (20 mEq total) by mouth 2 (two) times daily. 30 tablet 3  .  primidone (MYSOLINE) 50 MG tablet Take 100 mg by mouth at bedtime.     . tamsulosin (FLOMAX) 0.4 MG CAPS capsule Take 1 capsule (0.4 mg total) by mouth daily after breakfast. 30 capsule 12  . tiZANidine (ZANAFLEX) 4 MG tablet 4 mg 3 (three) times daily as needed for muscle spasms.   0  . traMADol (ULTRAM) 50 MG tablet Take 50 mg by mouth daily as needed for moderate pain.     Marland Kitchen zolpidem (AMBIEN) 10 MG tablet Take 10 mg by mouth at bedtime.  No current facility-administered medications for this visit.    No Known Allergies   Review of Systems: All systems reviewed and negative except where noted in HPI.     Physical Exam:    Wt Readings from Last 3 Encounters:  03/18/18 231 lb 12.8 oz (105.1 kg)  03/01/18 228 lb 8 oz (103.6 kg)  02/01/18 228 lb 12 oz (103.8 kg)    BP (!) 138/106   Pulse 76   Ht 5\' 6"  (1.676 m)   Wt 231 lb 12.8 oz (105.1 kg)   BMI 37.41 kg/m  Constitutional:  Pleasant, in no acute distress. Psychiatric: Normal mood and affect. Behavior is normal. EENT: Pupils normal.  Conjunctivae are normal. No scleral icterus. Neck supple. No cervical LAD. Cardiovascular: Normal rate, regular rhythm. No edema Pulmonary/chest: Effort normal and breath sounds normal. No wheezing, rales or rhonchi. Abdominal: Soft, nondistended, nontender. Bowel sounds active throughout. There are no masses palpable. No hepatomegaly. Neurological: Alert and oriented to person place and time. Skin: Skin is warm and dry. No rashes noted.   ASSESSMENT AND PLAN;   1) Iron Deficiency Anemia: Mr. Jim has a long-standing history of iron deficiency anemia since 2014, which has been responsive to iron infusions and periodic blood transfusions.  Etiology for his IDA not exactly clear.  Admission earlier this year with symptomatic IDA with EGD and colonoscopy largely unrevealing at that time.  We discussed evaluation for GI etiology for IDA at length, to include repeat endoscopic evaluation, VCE  and ultimately he would like to proceed with VCE at this time for small bowel interrogation. - VCE -If VCE unrevealing, may consider repeat colonoscopy to evaluate for small or subtle lesions plus minus repeat EGD given his melenic, FOBT positive stools - Continue to follow-up with hematology with iron infusions as planned - RTC following VCE to review the results.  2) Hepatic hemangioma: Imaging in 2015 notable for multiple hepatic cysts which were biopsied (in 2014) and consistent with hepatic hemangioma.  MRI also noted hemosiderosis.  Recommended repeat MRI to ensure stability of size of hemangiomas.  Can repeat at this time, and if stable/unchanged no further surveillance indicated.   Lavena Bullion, DO, FACG  03/18/2018, 2:19 PM   Berkley Harvey, NP

## 2018-03-18 NOTE — Patient Instructions (Signed)
[facility] CAPSULE ENDOSCOPY PATIENT INSTRUCTION MARON STANZIONE [birth year] [id number]   1. 03/18/18 Seven (7) days prior to capsule endoscopy stop taking iron supplements and carafate.  2. 03/23/18 Two (2) days prior to capsule endoscopy stop taking aspirin or any arthritis drugs.  3. 03/24/18 Day before capsule endoscopy purchase a 238 gram bottle of Miralax from the laxative section of your drug store, and a 32 oz. bottle of Gatorade (no red).    4. 03/24/18 One (1) day prior to capsule endoscopy: a) Stop smoking. b) Eat a regular diet until 12:00 Noon. c) After 12:00 Noon take only the following: Black coffee  Jell-O (no fruit or red Jell-o) Water   Bouillon (chicken or beef) 7-Up   Cranberry Juice Tea   Kool-Aid Popsicle (not red) Sprite   Coke Ginger Ale  Pepsi Mountain Dew Gatorade d) At 6:00 pm the evening before your appointment, drink 7 capfuls (105 grams) of Miralax with 32 oz. Gatorade. Drink 8 oz every 15 minutes until gone. e) Nothing to eat or drink after midnight except medications with a sip of water.  5. 03/25/18 Day of capsule endoscopy:            Do not have anything to eat or drink after midnight No medications for 2 hours prior to your test.  6. Please arrive at [facility]  3rd floor patient registration area by 8:00 am on: 03/25/2018.   For any questions: Call [facility] at [phone] and ask to speak with one of the capsule endoscopy nurses.      The above instructions have been reviewed and explained to me by________________   Patient signature:_________________________________________     Date:________________        What you should know.   You have been scheduled for a Capsule Endoscopy with Capsocam.  You will swallow a vitamin size pill that contains cameras that will take pictures of your small intestine (bowel).  Your small bowel connects to your stomach on one end and the large intestine or bowel on the other. The capsule  moves through the gastrointestinal tract taking pictures along the way. The images are stored on the capsule.  1-5 days after the capsule ingestion you will retrieve the capsule and mail it in a prepaid envelope to [facility] to download the images.  Your physician will be provided a copy of the images to review.   Who should have a capsule endoscopy?  You may need a small bowel capsule endoscopy if you have symptoms, such as blood in your stool, chronic pain, diarrhea, or unexplained anemia. The small intestine is a hollow organ that cannot be easily visualized with a scope due to its length.  The capsule endoscopy allows your physician to directly visualize the intestine.  The pictures may show intestine growths, inflammation, or bleeding in the small intestine.   What are the risks with a capsule endoscopy?  The pictures may not be clear or give [place] a clear cause of your symptoms The capsule may become trapped in the esophagus or intestines.  You may need surgery or endoscopic procedures to remove the capsule. You may not have a MRI until the capsule endoscopy has been retrieved.   How do I retrieve the capsule?  You will be provided a kit with step-by-step instructions for capsule retrieval and mailing.  You should expect to retrieve the capsule in 1-5 days after ingestion.  This will depend on your individual bowel habits.    It  was a pleasure to see you today!  Vito Cirigliano, D.O.

## 2018-03-21 ENCOUNTER — Telehealth: Payer: Self-pay | Admitting: Gastroenterology

## 2018-03-22 NOTE — Telephone Encounter (Signed)
Spoke with pt regarding medication instructions. Instructed patient to Have nothing to eat or drink after midnight the night before his capsule and To hold his medications 2 hours prior to Capsule. Patient verbalized understanding.

## 2018-03-25 ENCOUNTER — Ambulatory Visit (INDEPENDENT_AMBULATORY_CARE_PROVIDER_SITE_OTHER): Payer: PPO | Admitting: Gastroenterology

## 2018-03-25 ENCOUNTER — Encounter: Payer: Self-pay | Admitting: Gastroenterology

## 2018-03-25 DIAGNOSIS — D509 Iron deficiency anemia, unspecified: Secondary | ICD-10-CM | POA: Diagnosis not present

## 2018-03-25 DIAGNOSIS — J449 Chronic obstructive pulmonary disease, unspecified: Secondary | ICD-10-CM | POA: Diagnosis not present

## 2018-03-25 NOTE — Progress Notes (Signed)
Pt here for capsule endoscopy. States he completed the prep with no issues. Pt tolerated procedure well. Serial number [id number], Exp:  06/23/19.

## 2018-03-30 DIAGNOSIS — I428 Other cardiomyopathies: Secondary | ICD-10-CM | POA: Diagnosis not present

## 2018-03-30 DIAGNOSIS — I639 Cerebral infarction, unspecified: Secondary | ICD-10-CM | POA: Diagnosis not present

## 2018-03-30 DIAGNOSIS — Z95818 Presence of other cardiac implants and grafts: Secondary | ICD-10-CM | POA: Diagnosis not present

## 2018-03-30 DIAGNOSIS — D509 Iron deficiency anemia, unspecified: Secondary | ICD-10-CM | POA: Diagnosis not present

## 2018-03-30 DIAGNOSIS — I739 Peripheral vascular disease, unspecified: Secondary | ICD-10-CM | POA: Diagnosis not present

## 2018-03-30 DIAGNOSIS — R001 Bradycardia, unspecified: Secondary | ICD-10-CM | POA: Diagnosis not present

## 2018-03-30 DIAGNOSIS — I5022 Chronic systolic (congestive) heart failure: Secondary | ICD-10-CM | POA: Diagnosis not present

## 2018-03-30 DIAGNOSIS — Z8673 Personal history of transient ischemic attack (TIA), and cerebral infarction without residual deficits: Secondary | ICD-10-CM | POA: Diagnosis not present

## 2018-03-30 DIAGNOSIS — E118 Type 2 diabetes mellitus with unspecified complications: Secondary | ICD-10-CM | POA: Diagnosis not present

## 2018-03-30 DIAGNOSIS — I110 Hypertensive heart disease with heart failure: Secondary | ICD-10-CM | POA: Diagnosis not present

## 2018-03-30 DIAGNOSIS — E1100 Type 2 diabetes mellitus with hyperosmolarity without nonketotic hyperglycemic-hyperosmolar coma (NKHHC): Secondary | ICD-10-CM | POA: Diagnosis not present

## 2018-04-01 ENCOUNTER — Other Ambulatory Visit: Payer: Self-pay

## 2018-04-01 ENCOUNTER — Inpatient Hospital Stay: Payer: PPO

## 2018-04-01 ENCOUNTER — Inpatient Hospital Stay: Payer: PPO | Attending: Family | Admitting: Family

## 2018-04-01 ENCOUNTER — Encounter: Payer: Self-pay | Admitting: Family

## 2018-04-01 VITALS — BP 139/83 | HR 71 | Temp 98.2°F | Resp 16 | Wt 233.0 lb

## 2018-04-01 DIAGNOSIS — N189 Chronic kidney disease, unspecified: Secondary | ICD-10-CM | POA: Diagnosis not present

## 2018-04-01 DIAGNOSIS — D509 Iron deficiency anemia, unspecified: Secondary | ICD-10-CM | POA: Insufficient documentation

## 2018-04-01 DIAGNOSIS — M545 Low back pain: Secondary | ICD-10-CM | POA: Insufficient documentation

## 2018-04-01 DIAGNOSIS — K769 Liver disease, unspecified: Secondary | ICD-10-CM | POA: Insufficient documentation

## 2018-04-01 DIAGNOSIS — R6883 Chills (without fever): Secondary | ICD-10-CM | POA: Insufficient documentation

## 2018-04-01 DIAGNOSIS — E291 Testicular hypofunction: Secondary | ICD-10-CM | POA: Insufficient documentation

## 2018-04-01 DIAGNOSIS — R5383 Other fatigue: Secondary | ICD-10-CM | POA: Diagnosis not present

## 2018-04-01 DIAGNOSIS — D631 Anemia in chronic kidney disease: Secondary | ICD-10-CM | POA: Insufficient documentation

## 2018-04-01 DIAGNOSIS — M5432 Sciatica, left side: Secondary | ICD-10-CM | POA: Insufficient documentation

## 2018-04-01 DIAGNOSIS — D500 Iron deficiency anemia secondary to blood loss (chronic): Secondary | ICD-10-CM

## 2018-04-01 LAB — RETICULOCYTES
RBC.: 4.17 MIL/uL — ABNORMAL LOW (ref 4.20–5.82)
RETIC CT PCT: 2 % — AB (ref 0.8–1.8)
Retic Count, Absolute: 83.4 10*3/uL (ref 34.8–93.9)

## 2018-04-01 LAB — IRON AND TIBC
Iron: 26 ug/dL — ABNORMAL LOW (ref 42–163)
SATURATION RATIOS: 8 % — AB (ref 42–163)
TIBC: 321 ug/dL (ref 202–409)
UIBC: 296 ug/dL

## 2018-04-01 LAB — CBC WITH DIFFERENTIAL (CANCER CENTER ONLY)
Basophils Absolute: 0 10*3/uL (ref 0.0–0.1)
Basophils Relative: 0 %
Eosinophils Absolute: 0.2 10*3/uL (ref 0.0–0.5)
Eosinophils Relative: 5 %
HCT: 35 % — ABNORMAL LOW (ref 38.7–49.9)
HEMOGLOBIN: 10.6 g/dL — AB (ref 13.0–17.1)
Lymphocytes Relative: 27 %
Lymphs Abs: 1.4 10*3/uL (ref 0.9–3.3)
MCH: 25.5 pg — ABNORMAL LOW (ref 28.0–33.4)
MCHC: 30.3 g/dL — AB (ref 32.0–35.9)
MCV: 84.1 fL (ref 82.0–98.0)
MONOS PCT: 6 %
Monocytes Absolute: 0.3 10*3/uL (ref 0.1–0.9)
NEUTROS PCT: 62 %
Neutro Abs: 3.1 10*3/uL (ref 1.5–6.5)
Platelet Count: 358 10*3/uL (ref 145–400)
RBC: 4.16 MIL/uL — ABNORMAL LOW (ref 4.20–5.70)
RDW: 17.6 % — ABNORMAL HIGH (ref 11.1–15.7)
WBC Count: 5.1 10*3/uL (ref 4.0–10.0)

## 2018-04-01 LAB — CMP (CANCER CENTER ONLY)
ALK PHOS: 172 U/L — AB (ref 38–126)
ALT: 22 U/L (ref 0–44)
AST: 26 U/L (ref 15–41)
Albumin: 3.6 g/dL (ref 3.5–5.0)
Anion gap: 9 (ref 5–15)
BUN: 6 mg/dL — ABNORMAL LOW (ref 8–23)
CO2: 30 mmol/L (ref 22–32)
Calcium: 10.3 mg/dL (ref 8.9–10.3)
Chloride: 103 mmol/L (ref 98–111)
Creatinine: 1.25 mg/dL — ABNORMAL HIGH (ref 0.61–1.24)
GFR, Estimated: >60 mL/min (ref >60)
Glucose, Bld: 161 mg/dL — ABNORMAL HIGH (ref 70–99)
POTASSIUM: 3.3 mmol/L — AB (ref 3.5–5.1)
SODIUM: 142 mmol/L (ref 135–145)
TOTAL PROTEIN: 8.2 g/dL — AB (ref 6.5–8.1)
Total Bilirubin: <0.2 mg/dL — ABNORMAL LOW (ref 0.3–1.2)

## 2018-04-01 LAB — FERRITIN: FERRITIN: 43 ng/mL (ref 24–336)

## 2018-04-01 LAB — SAMPLE TO BLOOD BANK

## 2018-04-01 NOTE — Progress Notes (Signed)
Hematology and Oncology Follow Up Visit  James Zuniga [id number] 01-16-1956 62 y.o. 04/01/2018   Principle Diagnosis:  Iron deficiency anemia Hypotestosteronemia Chronic liver lesions  Of Note: History of stroke in June 2018, no more Testosterone or ESA's  Current Therapy:   IV iron as indicated- last received inAugust2019   Interim History: James Zuniga is here today with his wife for follow-up. He is still having some fatigue and chills. His SOB with exertion has remained stable.  He states that he saw his cardiologist earlier this week and got a good report.  He had a capsule endoscopy with Dr. Lyndel Safe and results are not yet available in the system.  He has not noted any episodes of blood loss, no bruising or petechiae.  He has had no issue with infection. No fever, n/v, cough, rash, dizziness, chest pain, palpitations, abdominal pain or changes in bowel or bladder habits.  He still has lower back pain with sciatica in the left leg. He is exercising at home and wants to hold off on a PT referral for now. He uses a cane when ambulating for support.  No falls or syncopal episodes to report.  He has numbness and tingling in his hands and feet off and on.  The puffiness in his feet and ankles comes and goes. He takes lasix as prescribed.  No lymphadenopathy noted on exam.  His blood sugars are "pretty good". He has maintained a good appetite and is staying well hydrated. His weight is stable.   ECOG Performance Status: 1 - Symptomatic but completely ambulatory  Medications:  Allergies as of 04/01/2018   No Known Allergies     Medication List        Accurate as of 04/01/18  9:01 AM. Always use your most recent med list.          albuterol (2.5 MG/3ML) 0.083% nebulizer solution Commonly known as:  PROVENTIL Take 2.5 mg by nebulization every 6 (six) hours as needed for wheezing or shortness of breath.   allopurinol 300 MG tablet Commonly known as:  ZYLOPRIM Take 300 mg by  mouth daily.   amLODipine 5 MG tablet Commonly known as:  NORVASC Take 5 mg by mouth daily.   aspirin 81 MG EC tablet Take 81 mg by mouth daily. Swallow whole.   atorvastatin 80 MG tablet Commonly known as:  LIPITOR Take 1 tablet (80 mg total) by mouth every morning.   clopidogrel 75 MG tablet Commonly known as:  PLAVIX Take 1 tablet (75 mg total) by mouth daily.   DULoxetine 60 MG capsule Commonly known as:  CYMBALTA Take 60 mg by mouth daily.   furosemide 20 MG tablet Commonly known as:  LASIX Take 20 mg by mouth 2 (two) times a week.   gabapentin 300 MG capsule Commonly known as:  NEURONTIN Take 300 mg by mouth 4 (four) times daily.   insulin NPH-regular Human (70-30) 100 UNIT/ML injection Commonly known as:  NOVOLIN 70/30 Please inject 25 units twice daily before breakfast and dinner.   ipratropium-albuterol 0.5-2.5 (3) MG/3ML Soln Commonly known as:  DUONEB Take 3 mLs by nebulization every 6 (six) hours as needed. DX: J44.9   losartan 100 MG tablet Commonly known as:  COZAAR Take 100 mg by mouth daily.   metoprolol succinate 25 MG 24 hr tablet Commonly known as:  TOPROL-XL Take 1 tablet (25 mg total) by mouth daily.   OXYGEN Inhale 2 L into the lungs as needed.   pantoprazole 20  MG tablet Commonly known as:  PROTONIX Take 1 tablet by mouth daily.   potassium chloride SA 20 MEQ tablet Commonly known as:  K-DUR,KLOR-CON Take 1 tablet (20 mEq total) by mouth 2 (two) times daily.   primidone 50 MG tablet Commonly known as:  MYSOLINE Take 100 mg by mouth at bedtime.   tamsulosin 0.4 MG Caps capsule Commonly known as:  FLOMAX Take 1 capsule (0.4 mg total) by mouth daily after breakfast.   tiZANidine 4 MG tablet Commonly known as:  ZANAFLEX 4 mg 3 (three) times daily as needed for muscle spasms.   traMADol 50 MG tablet Commonly known as:  ULTRAM Take 50 mg by mouth daily as needed for moderate pain.   VITAMIN B-12 IJ Inject 1,000 mg as directed  once a week. X 4   zolpidem 10 MG tablet Commonly known as:  AMBIEN Take 10 mg by mouth at bedtime.       Allergies: No Known Allergies  Past Medical History, Surgical history, Social history, and Family History were reviewed and updated.  Review of Systems: All other 10 point review of systems is negative.   Physical Exam:  vitals were not taken for this visit.   Wt Readings from Last 3 Encounters:  03/18/18 231 lb 12.8 oz (105.1 kg)  03/01/18 228 lb 8 oz (103.6 kg)  02/01/18 228 lb 12 oz (103.8 kg)    Ocular: Sclerae unicteric, pupils equal, round and reactive to light Ear-nose-throat: Oropharynx clear, dentition fair Lymphatic: No cervical, supraclavicular or axillary adenopathy Lungs no rales or rhonchi, good excursion bilaterally Heart regular rate and rhythm, no murmur appreciated Abd soft, nontender, positive bowel sounds, no liver or spleen tip palpated on exam, no fluid wave  MSK no focal spinal tenderness, no joint edema Neuro: non-focal, well-oriented, appropriate affect Breasts: Deferred   Lab Results  Component Value Date   WBC 5.1 04/01/2018   HGB 10.6 (L) 04/01/2018   HCT 35.0 (L) 04/01/2018   MCV 84.1 04/01/2018   PLT 358 04/01/2018   Lab Results  Component Value Date   FERRITIN 87 03/01/2018   IRON 37 (L) 03/01/2018   TIBC 299 03/01/2018   UIBC 262 03/01/2018   IRONPCTSAT 12 (L) 03/01/2018   Lab Results  Component Value Date   RETICCTPCT 2.6 (H) 03/01/2018   RBC 4.16 (L) 04/01/2018   RETICCTABS 63.0 06/20/2015   No results found for: Nils Pyle [facility] Lab Results  Component Value Date   IGA 139 08/26/2017   No results found for: Odetta Pink, SPEI   Chemistry      Component Value Date/Time   NA 134 03/01/2018 0823   NA 141 09/25/2016 0809   K 3.1 (L) 03/01/2018 0823   K 2.8 (LL) 09/25/2016 0809   CL 101 03/01/2018 0823   CL 102 07/31/2016 0815   CO2 32  03/01/2018 0823   CO2 28 09/25/2016 0809   BUN 10 03/01/2018 0823   BUN 13.1 09/25/2016 0809   CREATININE 1.40 (H) 03/01/2018 0823   CREATININE 1.2 09/25/2016 0809      Component Value Date/Time   CALCIUM 9.5 03/01/2018 0823   CALCIUM 9.5 09/25/2016 0809   ALKPHOS 99 (H) 03/01/2018 0823   ALKPHOS 126 09/25/2016 0809   AST 23 03/01/2018 0823   AST 19 09/25/2016 0809   ALT 23 03/01/2018 0823   ALT 23 09/25/2016 0809   BILITOT 0.5 03/01/2018 0823   BILITOT 0.45 09/25/2016 0809  Impression and Plan: James Zuniga is a very pleasant 62 yo African American gentleman with 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 He is doing well but still noting fatigue and chills.  Hgb is improved at 10.6, MCV 84.  We will see what his iron studies show and bring him back in for infusion if needed.  We will go ahead and plan to see him back in another months for follow-up.  They will contact our office with any questions or concerns. We can certainly see him sooner if need be.   Laverna Peace, NP 9/20/20199:01 AM

## 2018-04-05 DIAGNOSIS — E1149 Type 2 diabetes mellitus with other diabetic neurological complication: Secondary | ICD-10-CM | POA: Diagnosis not present

## 2018-04-05 DIAGNOSIS — F5104 Psychophysiologic insomnia: Secondary | ICD-10-CM | POA: Diagnosis not present

## 2018-04-05 DIAGNOSIS — Z87891 Personal history of nicotine dependence: Secondary | ICD-10-CM | POA: Diagnosis not present

## 2018-04-05 DIAGNOSIS — E538 Deficiency of other specified B group vitamins: Secondary | ICD-10-CM | POA: Diagnosis not present

## 2018-04-05 DIAGNOSIS — I10 Essential (primary) hypertension: Secondary | ICD-10-CM | POA: Diagnosis not present

## 2018-04-05 DIAGNOSIS — Z794 Long term (current) use of insulin: Secondary | ICD-10-CM | POA: Diagnosis not present

## 2018-04-07 ENCOUNTER — Telehealth: Payer: Self-pay | Admitting: Gastroenterology

## 2018-04-07 ENCOUNTER — Inpatient Hospital Stay: Payer: PPO

## 2018-04-07 VITALS — BP 108/64 | HR 73 | Temp 98.0°F | Resp 17

## 2018-04-07 DIAGNOSIS — D509 Iron deficiency anemia, unspecified: Secondary | ICD-10-CM | POA: Diagnosis not present

## 2018-04-07 MED ORDER — SODIUM CHLORIDE 0.9 % IV SOLN
INTRAVENOUS | Status: DC
  Administered 2018-04-07: 09:00:00 via INTRAVENOUS
  Filled 2018-04-07: qty 250

## 2018-04-07 MED ORDER — SODIUM CHLORIDE 0.9 % IV SOLN
510.0000 mg | Freq: Once | INTRAVENOUS | Status: AC
  Administered 2018-04-07: 510 mg via INTRAVENOUS
  Filled 2018-04-07: qty 17

## 2018-04-07 NOTE — Telephone Encounter (Signed)
Left message for the wife. Will forward to ordering provider. No results are scanned into the chart yet. Should be soon I think.

## 2018-04-08 DIAGNOSIS — I10 Essential (primary) hypertension: Secondary | ICD-10-CM | POA: Diagnosis not present

## 2018-04-08 DIAGNOSIS — E1149 Type 2 diabetes mellitus with other diabetic neurological complication: Secondary | ICD-10-CM | POA: Diagnosis not present

## 2018-04-08 DIAGNOSIS — Z794 Long term (current) use of insulin: Secondary | ICD-10-CM | POA: Diagnosis not present

## 2018-04-08 NOTE — Telephone Encounter (Signed)
The pt was called and advised and scheduled an appt with Dr Bryan Lemma for 04/13/18 at the [place] office.  The pt was given the address and he verbalized all understanding

## 2018-04-11 DIAGNOSIS — J449 Chronic obstructive pulmonary disease, unspecified: Secondary | ICD-10-CM | POA: Diagnosis not present

## 2018-04-11 DIAGNOSIS — I639 Cerebral infarction, unspecified: Secondary | ICD-10-CM | POA: Diagnosis not present

## 2018-04-13 ENCOUNTER — Ambulatory Visit: Payer: PPO | Admitting: Gastroenterology

## 2018-04-13 ENCOUNTER — Encounter: Payer: Self-pay | Admitting: Gastroenterology

## 2018-04-13 VITALS — BP 132/76 | HR 62 | Ht 66.0 in | Wt 234.0 lb

## 2018-04-13 DIAGNOSIS — D1800 Hemangioma unspecified site: Secondary | ICD-10-CM | POA: Diagnosis not present

## 2018-04-13 DIAGNOSIS — D509 Iron deficiency anemia, unspecified: Secondary | ICD-10-CM

## 2018-04-13 DIAGNOSIS — G4733 Obstructive sleep apnea (adult) (pediatric): Secondary | ICD-10-CM

## 2018-04-13 DIAGNOSIS — R5383 Other fatigue: Secondary | ICD-10-CM | POA: Diagnosis not present

## 2018-04-13 DIAGNOSIS — K5520 Angiodysplasia of colon without hemorrhage: Secondary | ICD-10-CM | POA: Diagnosis not present

## 2018-04-13 NOTE — Progress Notes (Signed)
P  Chief Complaint:    Iron deficiency anemia  GI History: James Zuniga is a 62 y.o. male with a long-standing history of IDA since 2014, and has been following with hematology with iron infusions in 2015. He had subsequent normalization of hemoglobin/hematocrit and iron indices through June 2018, but was admitted in February 2019 with fatigue, FOBT positive stools, iron deficiency anemia, requiring blood transfusion.  EGD at that time only notable for a small antral hyperplastic polyp, and colonoscopy without any evidence of recent or active bleeding.  He continues to follow in hematology clinic with regular iron infusions (last infusion 04/07/2018) and last PRBC transfusion in July.  He was last seen by me on 03/18/18 for intermittent melena and recent FOBT positive stool, and referred for VCE.  VCE completed earlier this month and notable for 2 small AVMs in the proximal small bowel and otherwise suboptimal prep so unable to rule out additional small lesions in the small bowel.  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  Biopsy of 1 of these lesions in 2014 confirmed hemangioma.  Radiologist recommended repeat MRI in 3 to 6 months to evaluate for stability.  PET scan in 2015 was otherwise negative.  Otherwise, no known personal or family history of hepatobiliary disease.  Repeat MRI ordered at last appointment.  HPI:     Patient is a 62 y.o. male presenting to the Gastroenterology Clinic for follow-up of IDA and to review recent VCE results.   His most recent hemoglobin/hematocrit was 10.6/35.0 with MCV/RDW 84.1/17.6 on 04/01/2018, slightly improved from prior.  Iron panel with ferritin 43 (down from 87), iron 26, iron saturation 8% (both slightly down from August).  Was given iron infusion subsequent to that lab panel.  Today he states he continues to have fatigue and tires easily with driving to the  point that he has to ask his wife to drive.  Some improvement since B12 injection and iron infusion last week.  Has an appointment scheduled with his pulmonologist later this month.  Was previously prescribed oxygen as needed, but this is since been taken back by the company due to insurance issues.  Otherwise, no overt GI blood loss and tolerating all p.o. intake.  No abdominal pain, nausea, vomiting.  Review of systems:     No chest pain, no SOB, no fevers, no urinary sx   Past Medical History:  Diagnosis Date  . Arthritis    "all over" (08/24/2017)  . CHF (congestive heart failure) ([place])   . Chronic airway obstruction ([place])   . Chronic lower back pain   . Coronary artery disease   . Disorder of liver    "spots on my liver; they've rechecked them; they haven't changed" (08/24/2017)  . Esophageal reflux   . Gout    "on daily RX" (08/24/2017)  . Hyperlipidemia   . Hypertension   . Hypokalemia 11/2016  . Hypotestosteronism 07/01/2013  . Insomnia   . Iron deficiency anemia, unspecified 07/01/2013  . Memory loss   . On home oxygen therapy    "2L prn" (08/24/2017)  . OSA on CPAP   . PVD (peripheral vascular disease) ([place])   . Renal disorder   . Stroke [facility]) 01/2017   "left sided weakness remains" (08/24/2017)  . Type II diabetes mellitus ([place])   . Vitamin D deficiency     Patient's surgical history, family medical history, social history,  medications and allergies were all reviewed in Epic    Current Outpatient Medications  Medication Sig Dispense Refill  . albuterol (PROVENTIL) (2.5 MG/3ML) 0.083% nebulizer solution Take 2.5 mg by nebulization every 6 (six) hours as needed for wheezing or shortness of breath.    . allopurinol (ZYLOPRIM) 300 MG tablet Take 300 mg by mouth daily.    Marland Kitchen amLODipine (NORVASC) 5 MG tablet Take 5 mg by mouth daily.     Marland Kitchen aspirin 81 MG EC tablet Take 81 mg by mouth daily. Swallow whole.    Marland Kitchen atorvastatin (LIPITOR) 80 MG tablet Take 1 tablet (80 mg total)  by mouth every morning. 30 tablet 0  . clopidogrel (PLAVIX) 75 MG tablet Take 1 tablet (75 mg total) by mouth daily. 30 tablet 2  . Cyanocobalamin (VITAMIN B-12 IJ) Inject 1,000 mg as directed once a week. X 4    . DULoxetine (CYMBALTA) 60 MG capsule Take 60 mg by mouth daily.    . furosemide (LASIX) 20 MG tablet Take 20 mg by mouth 2 (two) times a week.     . gabapentin (NEURONTIN) 300 MG capsule Take 300 mg by mouth 4 (four) times daily.     . insulin NPH-regular Human (NOVOLIN 70/30) (70-30) 100 UNIT/ML injection Please inject 25 units twice daily before breakfast and dinner. (Patient taking differently: Please inject 30 units with breakfast & 25 units with dinner.) 10 mL 3  . ipratropium-albuterol (DUONEB) 0.5-2.5 (3) MG/3ML SOLN Take 3 mLs by nebulization every 6 (six) hours as needed. DX: J44.9 360 mL 5  . losartan (COZAAR) 100 MG tablet Take 100 mg by mouth daily.     . metoprolol succinate (TOPROL-XL) 25 MG 24 hr tablet Take 1 tablet (25 mg total) by mouth daily. (Patient taking differently: Take 25 mg by mouth 2 (two) times daily. ) 60 tablet 2  . OXYGEN Inhale 2 L into the lungs as needed.    . pantoprazole (PROTONIX) 20 MG tablet Take 1 tablet by mouth daily.    . potassium chloride SA (K-DUR,KLOR-CON) 20 MEQ tablet Take 1 tablet (20 mEq total) by mouth 2 (two) times daily. 30 tablet 3  . primidone (MYSOLINE) 50 MG tablet Take 100 mg by mouth at bedtime.     . tamsulosin (FLOMAX) 0.4 MG CAPS capsule Take 1 capsule (0.4 mg total) by mouth daily after breakfast. 30 capsule 12  . tiZANidine (ZANAFLEX) 4 MG tablet 4 mg 3 (three) times daily as needed for muscle spasms.   0  . traMADol (ULTRAM) 50 MG tablet Take 50 mg by mouth daily as needed for moderate pain.     Marland Kitchen zolpidem (AMBIEN) 10 MG tablet Take 10 mg by mouth at bedtime.      No current facility-administered medications for this visit.     Physical Exam:     There were no vitals taken for this visit.  GENERAL:  Pleasant  male in NAD PSYCH: : Cooperative, normal affect EENT:  conjunctiva pink, mucous membranes moist, neck supple without masses CARDIAC:  RRR, no murmur heard, no peripheral edema PULM: Normal respiratory effort, lungs CTA bilaterally, no wheezing ABDOMEN:  Nondistended, soft, nontender. No obvious masses, no hepatomegaly,  normal bowel sounds SKIN:  turgor, no lesions seen Musculoskeletal:  Normal muscle tone, normal strength NEURO: Alert and oriented x 3, well conversive, walks with cane   IMPRESSION and PLAN:    #1.  Iron deficiency anemia: Long-standing history of IDA responsive to iron infusions and periodic  blood transfusion.  Recent VCE notable for 2 small AVMs in the proximal small bowel, which were likely just out of reach of a standard endoscope.  Due to suboptimal bowel prep, unable to rule out additional small flat lesions.  Discussed this at length and will plan on treating as below: - Set up for push enteroscopy at [facility].  Given location of AVMs on VCE, can likely reach with push enteroscopy without need for single balloon enteroscopy at this time.  But did discuss the possibility of DAE in the future versus octreotide depending on endoscopic findings and response to therapy - Continue to follow-up with hematology with iron infusions as indicated     #2.  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  Will treat with push enteroscopy as above.   #3.  Fatigue: Some improvement in fatigue with B12 injection and iron infusion last week.  Will evaluate for clinical improvement with treatment of AVMs as above, otherwise to follow-up with his PCM for ongoing evaluation and treatment.  Additionally has an appointment scheduled with his pulmonologist later this month.    #4.  Hepatic hemangioma: Has repeat MRI scheduled for evaluation of stability of appearance in size.  We will follow-up results.  #5.  COPD, OSA, home oxygen: We will plan on performing procedure at [facility] due to previously prescribed home oxygen.  Otherwise, does use CPAP as prescribed.  To follow-up with his pulmonologist later this month per patient.  The indications, risks, and benefits of small bowel enteroscopy were explained to the patient in detail. Risks include but are not limited to bleeding, perforation, adverse reaction to medications, and cardiopulmonary compromise. Sequelae include but are not limited to the possibility of surgery, hositalization, and mortality. The patient verbalized understanding and wished to proceed. All questions answered, referred to scheduler. Further recommendations pending results of the exam.    Lavena Bullion ,DO, FACG 04/13/2018, 8:37 AM

## 2018-04-13 NOTE — Patient Instructions (Addendum)
If you are age 63 or older, your body mass index should be between 23-30. Your There is no height or weight on file to calculate BMI. If this is out of the aforementioned range listed, please consider follow up with your Primary Care Provider.  If you are age 66 or younger, your body mass index should be between 19-25. Your There is no height or weight on file to calculate BMI. If this is out of the aformentioned range listed, please consider follow up with your Primary Care Provider.   You have been scheduled for a Enteroscopy at [facility] for 05/24/2018 at 9:30am please arrive to Registration by 8:00am.  It was a pleasure to see you today!  Vito Cirigliano, D.O.

## 2018-04-18 DIAGNOSIS — H40013 Open angle with borderline findings, low risk, bilateral: Secondary | ICD-10-CM | POA: Diagnosis not present

## 2018-04-24 DIAGNOSIS — J449 Chronic obstructive pulmonary disease, unspecified: Secondary | ICD-10-CM | POA: Diagnosis not present

## 2018-05-03 ENCOUNTER — Other Ambulatory Visit: Payer: Self-pay

## 2018-05-03 ENCOUNTER — Inpatient Hospital Stay: Payer: PPO | Attending: Family | Admitting: Family

## 2018-05-03 ENCOUNTER — Inpatient Hospital Stay: Payer: PPO

## 2018-05-03 VITALS — BP 111/71 | HR 73

## 2018-05-03 VITALS — BP 130/70 | HR 75 | Temp 97.7°F | Resp 16 | Wt 232.0 lb

## 2018-05-03 DIAGNOSIS — K922 Gastrointestinal hemorrhage, unspecified: Secondary | ICD-10-CM

## 2018-05-03 DIAGNOSIS — D500 Iron deficiency anemia secondary to blood loss (chronic): Secondary | ICD-10-CM

## 2018-05-03 DIAGNOSIS — D509 Iron deficiency anemia, unspecified: Secondary | ICD-10-CM

## 2018-05-03 DIAGNOSIS — D631 Anemia in chronic kidney disease: Secondary | ICD-10-CM

## 2018-05-03 DIAGNOSIS — Z8673 Personal history of transient ischemic attack (TIA), and cerebral infarction without residual deficits: Secondary | ICD-10-CM

## 2018-05-03 LAB — CBC WITH DIFFERENTIAL (CANCER CENTER ONLY)
ABS IMMATURE GRANULOCYTES: 0.02 10*3/uL (ref 0.00–0.07)
BASOS PCT: 1 %
Basophils Absolute: 0 10*3/uL (ref 0.0–0.1)
EOS ABS: 0.2 10*3/uL (ref 0.0–0.5)
Eosinophils Relative: 4 %
HCT: 29.1 % — ABNORMAL LOW (ref 39.0–52.0)
Hemoglobin: 8.3 g/dL — ABNORMAL LOW (ref 13.0–17.0)
IMMATURE GRANULOCYTES: 0 %
Lymphocytes Relative: 29 %
Lymphs Abs: 1.5 10*3/uL (ref 0.7–4.0)
MCH: 23.8 pg — AB (ref 26.0–34.0)
MCHC: 28.5 g/dL — ABNORMAL LOW (ref 30.0–36.0)
MCV: 83.4 fL (ref 80.0–100.0)
MONOS PCT: 6 %
Monocytes Absolute: 0.3 10*3/uL (ref 0.1–1.0)
NEUTROS ABS: 3.1 10*3/uL (ref 1.7–7.7)
NEUTROS PCT: 60 %
NRBC: 0 % (ref 0.0–0.2)
PLATELETS: 352 10*3/uL (ref 150–400)
RBC: 3.49 MIL/uL — AB (ref 4.22–5.81)
RDW: 18.3 % — AB (ref 11.5–15.5)
WBC: 5.2 10*3/uL (ref 4.0–10.5)

## 2018-05-03 LAB — CMP (CANCER CENTER ONLY)
ALBUMIN: 3.5 g/dL (ref 3.5–5.0)
ALK PHOS: 142 U/L — AB (ref 38–126)
ALT: 21 U/L (ref 0–44)
ANION GAP: 10 (ref 5–15)
AST: 19 U/L (ref 15–41)
BILIRUBIN TOTAL: 0.2 mg/dL — AB (ref 0.3–1.2)
BUN: 14 mg/dL (ref 8–23)
CO2: 29 mmol/L (ref 22–32)
Calcium: 10.2 mg/dL (ref 8.9–10.3)
Chloride: 103 mmol/L (ref 98–111)
Creatinine: 1.36 mg/dL — ABNORMAL HIGH (ref 0.61–1.24)
GFR, Estimated: 54 mL/min — ABNORMAL LOW (ref >60)
GLUCOSE: 218 mg/dL — AB (ref 70–99)
POTASSIUM: 3.2 mmol/L — AB (ref 3.5–5.1)
Sodium: 142 mmol/L (ref 135–145)
TOTAL PROTEIN: 7.9 g/dL (ref 6.5–8.1)

## 2018-05-03 LAB — RETICULOCYTES
Immature Retic Fract: 30.1 % — ABNORMAL HIGH (ref 2.3–15.9)
RBC.: 3.49 MIL/uL — AB (ref 4.22–5.81)
Retic Count, Absolute: 86.6 10*3/uL (ref 19.0–186.0)
Retic Ct Pct: 2.5 % (ref 0.4–3.1)

## 2018-05-03 LAB — SAMPLE TO BLOOD BANK

## 2018-05-03 LAB — IRON AND TIBC
IRON: 27 ug/dL — AB (ref 42–163)
SATURATION RATIOS: 8 % — AB (ref 42–163)
TIBC: 346 ug/dL (ref 202–409)
UIBC: 320 ug/dL

## 2018-05-03 LAB — FERRITIN: Ferritin: 30 ng/mL (ref 24–336)

## 2018-05-03 MED ORDER — SODIUM CHLORIDE 0.9 % IV SOLN
510.0000 mg | Freq: Once | INTRAVENOUS | Status: AC
  Administered 2018-05-03: 510 mg via INTRAVENOUS
  Filled 2018-05-03: qty 17

## 2018-05-03 MED ORDER — SODIUM CHLORIDE 0.9 % IV SOLN
INTRAVENOUS | Status: DC
  Administered 2018-05-03: 10:00:00 via INTRAVENOUS
  Filled 2018-05-03: qty 250

## 2018-05-03 NOTE — Patient Instructions (Signed)

## 2018-05-03 NOTE — Progress Notes (Signed)
Hematology and Oncology Follow Up Visit  James Zuniga [id number] [birth year] 62 y.o. 05/03/2018   Principle Diagnosis:  Iron deficiency anemia Hypotestosteronemia Chronic liver lesions  Of Note: History of stroke in June 2018, no more Testosterone or ESA's  Current Therapy:   IV iron as indicated- last received inSeptember2019   Interim History: James Zuniga is here today with his wife for follow-up. He is symptomatic with fatigue, weakness, chills and SOB with exertion. He has also 2 near syncopal episodes and 1 fall.  Hgb is down to 8.3 from 10.6.  He had 2 small AVM's noted on VCE with Dr. Bryan Lemma. He is schedule to have these treated with push enteroscopy on November 12th.  He has not noted any other bleeding. No bruising or petechiae.  No fever, n/v, cough, rash, chest pain, palpitations, abdominal pain or changes in bowel or bladder habits.  The puffiness in hi ankles is stable. He is taking his lasix as prescribed.  No tenderness, numbness or tingling in his extremities at this time. He is using his cane when ambulating for support.  No lymphadenopathy noted on exam.  He is eating well and staying hydrated. His weight is stable.   ECOG Performance Status: 1 - Symptomatic but completely ambulatory  Medications:  Allergies as of 05/03/2018   No Known Allergies     Medication List        Accurate as of 05/03/18  9:15 AM. Always use your most recent med list.          albuterol (2.5 MG/3ML) 0.083% nebulizer solution Commonly known as:  PROVENTIL Take 2.5 mg by nebulization every 6 (six) hours as needed for wheezing or shortness of breath.   allopurinol 300 MG tablet Commonly known as:  ZYLOPRIM Take 300 mg by mouth daily.   amLODipine 5 MG tablet Commonly known as:  NORVASC Take 5 mg by mouth daily.   aspirin 81 MG EC tablet Take 81 mg by mouth daily. Swallow whole.   atorvastatin 80 MG tablet Commonly known as:  LIPITOR Take 1 tablet (80 mg total)  by mouth every morning.   clopidogrel 75 MG tablet Commonly known as:  PLAVIX Take 1 tablet (75 mg total) by mouth daily.   DULoxetine 60 MG capsule Commonly known as:  CYMBALTA Take 60 mg by mouth daily.   furosemide 20 MG tablet Commonly known as:  LASIX Take 20 mg by mouth 2 (two) times a week.   gabapentin 300 MG capsule Commonly known as:  NEURONTIN Take 300 mg by mouth 4 (four) times daily.   insulin NPH-regular Human (70-30) 100 UNIT/ML injection Commonly known as:  NOVOLIN 70/30 Please inject 25 units twice daily before breakfast and dinner.   ipratropium-albuterol 0.5-2.5 (3) MG/3ML Soln Commonly known as:  DUONEB Take 3 mLs by nebulization every 6 (six) hours as needed. DX: J44.9   losartan 100 MG tablet Commonly known as:  COZAAR Take 100 mg by mouth daily.   metoprolol succinate 25 MG 24 hr tablet Commonly known as:  TOPROL-XL Take 1 tablet (25 mg total) by mouth daily.   pantoprazole 20 MG tablet Commonly known as:  PROTONIX Take 1 tablet by mouth daily.   potassium chloride SA 20 MEQ tablet Commonly known as:  K-DUR,KLOR-CON Take 1 tablet (20 mEq total) by mouth 2 (two) times daily.   primidone 50 MG tablet Commonly known as:  MYSOLINE Take 100 mg by mouth at bedtime.   tamsulosin 0.4 MG Caps capsule Commonly known  as:  FLOMAX Take 1 capsule (0.4 mg total) by mouth daily after breakfast.   tiZANidine 4 MG tablet Commonly known as:  ZANAFLEX 4 mg 3 (three) times daily as needed for muscle spasms.   traMADol 50 MG tablet Commonly known as:  ULTRAM Take 50 mg by mouth daily as needed for moderate pain.   VITAMIN B-12 IJ Inject 1,000 mg as directed every 30 (thirty) days. X 4   zolpidem 10 MG tablet Commonly known as:  AMBIEN Take 10 mg by mouth at bedtime.       Allergies: No Known Allergies  Past Medical History, Surgical history, Social history, and Family History were reviewed and updated.  Review of Systems: All other 10 point  review of systems is negative.   Physical Exam:  weight is 232 lb (105.2 kg). His oral temperature is 97.7 F (36.5 C). His blood pressure is 130/70 and his pulse is 75. His respiration is 16 and oxygen saturation is 100%.   Wt Readings from Last 3 Encounters:  05/03/18 232 lb (105.2 kg)  04/13/18 234 lb (106.1 kg)  04/01/18 233 lb (105.7 kg)    Ocular: Sclerae unicteric, pupils equal, round and reactive to light Ear-nose-throat: Oropharynx clear, dentition fair Lymphatic: No cervical, supraclavicular or axillary adenopathy Lungs no rales or rhonchi, good excursion bilaterally Heart regular rate and rhythm, no murmur appreciated Abd soft, nontender, positive bowel sounds, no liver or spleen tip palpated on exam, no fluid wave  MSK no focal spinal tenderness, no joint edema Neuro: non-focal, well-oriented, appropriate affect Breasts: Deferred   Lab Results  Component Value Date   WBC 5.2 05/03/2018   HGB 8.3 (L) 05/03/2018   HCT 29.1 (L) 05/03/2018   MCV 83.4 05/03/2018   PLT 352 05/03/2018   Lab Results  Component Value Date   FERRITIN 43 04/01/2018   IRON 26 (L) 04/01/2018   TIBC 321 04/01/2018   UIBC 296 04/01/2018   IRONPCTSAT 8 (L) 04/01/2018   Lab Results  Component Value Date   RETICCTPCT 2.5 05/03/2018   RBC 3.49 (L) 05/03/2018   RBC 3.49 (L) 05/03/2018   RETICCTABS 63.0 06/20/2015   No results found for: Nils Pyle [facility] Lab Results  Component Value Date   IGA 139 08/26/2017   No results found for: Odetta Pink, SPEI   Chemistry      Component Value Date/Time   NA 142 04/01/2018 0838   NA 141 09/25/2016 0809   K 3.3 (L) 04/01/2018 0838   K 2.8 (LL) 09/25/2016 0809   CL 103 04/01/2018 0838   CL 102 07/31/2016 0815   CO2 30 04/01/2018 0838   CO2 28 09/25/2016 0809   BUN 6 (L) 04/01/2018 0838   BUN 13.1 09/25/2016 0809   CREATININE 1.25 (H) 04/01/2018 0838   CREATININE 1.2  09/25/2016 0809      Component Value Date/Time   CALCIUM 10.3 04/01/2018 0838   CALCIUM 9.5 09/25/2016 0809   ALKPHOS 172 (H) 04/01/2018 0838   ALKPHOS 126 09/25/2016 0809   AST 26 04/01/2018 0838   AST 19 09/25/2016 0809   ALT 22 04/01/2018 0838   ALT 23 09/25/2016 0809   BILITOT <0.2 (L) 04/01/2018 0838   BILITOT 0.45 09/25/2016 0809      Impression and Plan: James Zuniga is a very pleasant 62 yo African American gentleman with 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  With his current symptoms and drop in Hgb due  to GI blood loss we will proceed with IV iron today and plan on a second dose for next week.  We see him back in another month for follow-up.  They will contact our office with any questions or concerns. We can certainly see him sooner if need be.   Laverna Peace, NP 10/22/20199:15 AM

## 2018-05-06 DIAGNOSIS — J449 Chronic obstructive pulmonary disease, unspecified: Secondary | ICD-10-CM | POA: Diagnosis not present

## 2018-05-06 DIAGNOSIS — E538 Deficiency of other specified B group vitamins: Secondary | ICD-10-CM | POA: Diagnosis not present

## 2018-05-10 ENCOUNTER — Inpatient Hospital Stay: Payer: PPO

## 2018-05-10 VITALS — BP 119/72 | HR 72

## 2018-05-10 DIAGNOSIS — D500 Iron deficiency anemia secondary to blood loss (chronic): Secondary | ICD-10-CM | POA: Diagnosis not present

## 2018-05-10 DIAGNOSIS — D509 Iron deficiency anemia, unspecified: Secondary | ICD-10-CM

## 2018-05-10 MED ORDER — SODIUM CHLORIDE 0.9 % IV SOLN
510.0000 mg | Freq: Once | INTRAVENOUS | Status: AC
  Administered 2018-05-10: 510 mg via INTRAVENOUS
  Filled 2018-05-10: qty 17

## 2018-05-10 MED ORDER — SODIUM CHLORIDE 0.9 % IV SOLN
Freq: Once | INTRAVENOUS | Status: AC
  Administered 2018-05-10: 09:00:00 via INTRAVENOUS
  Filled 2018-05-10: qty 250

## 2018-05-10 NOTE — Patient Instructions (Signed)

## 2018-05-17 ENCOUNTER — Telehealth: Payer: Self-pay

## 2018-05-17 NOTE — Telephone Encounter (Signed)
Faxed the cardiac clearance to Cardiologist Dereck Leep on 04/13/2018 on 05/13/2018 he faxed back stating it is okay for James Zuniga to hold his plavix for 5 days prior to his Enteroscopy. Notified that patient and he verbalized understanding of orders.

## 2018-05-23 ENCOUNTER — Encounter (HOSPITAL_COMMUNITY): Payer: Self-pay | Admitting: *Deleted

## 2018-05-23 ENCOUNTER — Other Ambulatory Visit: Payer: Self-pay

## 2018-05-23 NOTE — Progress Notes (Signed)
After preprocedure call complete realized needed to instruct patient only to take portion of pm dose of Insulin nite before procedure.  Called patient back and LVMM for patient to call back.  Patient never returned call.

## 2018-05-23 NOTE — Progress Notes (Signed)
Patient was instructed at time of preop call o 05/23/2018 not to take any Insulin am of procedure.  Patient verbalized understanding.

## 2018-05-24 ENCOUNTER — Ambulatory Visit (HOSPITAL_COMMUNITY)
Admission: RE | Admit: 2018-05-24 | Discharge: 2018-05-24 | Disposition: A | Discharge status: Home / Self Care | Payer: PPO | Source: Ambulatory Visit | Attending: Gastroenterology | Admitting: Gastroenterology

## 2018-05-24 ENCOUNTER — Encounter (HOSPITAL_COMMUNITY)
Admission: RE | Disposition: A | Discharge status: Home / Self Care | Payer: Self-pay | Source: Ambulatory Visit | Attending: Gastroenterology

## 2018-05-24 ENCOUNTER — Encounter (HOSPITAL_COMMUNITY): Payer: Self-pay | Admitting: Emergency Medicine

## 2018-05-24 ENCOUNTER — Ambulatory Visit (HOSPITAL_COMMUNITY): Payer: PPO | Admitting: Certified Registered Nurse Anesthetist

## 2018-05-24 ENCOUNTER — Other Ambulatory Visit: Payer: Self-pay

## 2018-05-24 DIAGNOSIS — Z6836 Body mass index (BMI) 36.0-36.9, adult: Secondary | ICD-10-CM | POA: Insufficient documentation

## 2018-05-24 DIAGNOSIS — K5520 Angiodysplasia of colon without hemorrhage: Secondary | ICD-10-CM

## 2018-05-24 DIAGNOSIS — K31819 Angiodysplasia of stomach and duodenum without bleeding: Secondary | ICD-10-CM | POA: Insufficient documentation

## 2018-05-24 DIAGNOSIS — K319 Disease of stomach and duodenum, unspecified: Secondary | ICD-10-CM | POA: Insufficient documentation

## 2018-05-24 DIAGNOSIS — Z9981 Dependence on supplemental oxygen: Secondary | ICD-10-CM | POA: Insufficient documentation

## 2018-05-24 DIAGNOSIS — G4733 Obstructive sleep apnea (adult) (pediatric): Secondary | ICD-10-CM | POA: Diagnosis not present

## 2018-05-24 DIAGNOSIS — D1803 Hemangioma of intra-abdominal structures: Secondary | ICD-10-CM | POA: Diagnosis not present

## 2018-05-24 DIAGNOSIS — Z794 Long term (current) use of insulin: Secondary | ICD-10-CM | POA: Insufficient documentation

## 2018-05-24 DIAGNOSIS — D509 Iron deficiency anemia, unspecified: Secondary | ICD-10-CM | POA: Diagnosis not present

## 2018-05-24 DIAGNOSIS — I2510 Atherosclerotic heart disease of native coronary artery without angina pectoris: Secondary | ICD-10-CM | POA: Diagnosis not present

## 2018-05-24 DIAGNOSIS — E1151 Type 2 diabetes mellitus with diabetic peripheral angiopathy without gangrene: Secondary | ICD-10-CM | POA: Insufficient documentation

## 2018-05-24 DIAGNOSIS — K3189 Other diseases of stomach and duodenum: Secondary | ICD-10-CM | POA: Diagnosis not present

## 2018-05-24 DIAGNOSIS — D1800 Hemangioma unspecified site: Secondary | ICD-10-CM

## 2018-05-24 DIAGNOSIS — G4730 Sleep apnea, unspecified: Secondary | ICD-10-CM | POA: Insufficient documentation

## 2018-05-24 DIAGNOSIS — I10 Essential (primary) hypertension: Secondary | ICD-10-CM | POA: Insufficient documentation

## 2018-05-24 DIAGNOSIS — K317 Polyp of stomach and duodenum: Secondary | ICD-10-CM | POA: Diagnosis not present

## 2018-05-24 DIAGNOSIS — Z87891 Personal history of nicotine dependence: Secondary | ICD-10-CM | POA: Insufficient documentation

## 2018-05-24 DIAGNOSIS — K922 Gastrointestinal hemorrhage, unspecified: Secondary | ICD-10-CM | POA: Diagnosis not present

## 2018-05-24 DIAGNOSIS — I69354 Hemiplegia and hemiparesis following cerebral infarction affecting left non-dominant side: Secondary | ICD-10-CM | POA: Insufficient documentation

## 2018-05-24 DIAGNOSIS — J449 Chronic obstructive pulmonary disease, unspecified: Secondary | ICD-10-CM | POA: Insufficient documentation

## 2018-05-24 DIAGNOSIS — E669 Obesity, unspecified: Secondary | ICD-10-CM | POA: Diagnosis not present

## 2018-05-24 DIAGNOSIS — Z9989 Dependence on other enabling machines and devices: Secondary | ICD-10-CM | POA: Insufficient documentation

## 2018-05-24 HISTORY — PX: HOT HEMOSTASIS: SHX5433

## 2018-05-24 HISTORY — PX: POLYPECTOMY: SHX5525

## 2018-05-24 HISTORY — PX: ENTEROSCOPY: SHX5533

## 2018-05-24 LAB — GLUCOSE, CAPILLARY: GLUCOSE-CAPILLARY: 84 mg/dL (ref 70–99)

## 2018-05-24 SURGERY — ENTEROSCOPY
Anesthesia: Monitor Anesthesia Care

## 2018-05-24 MED ORDER — PROPOFOL 500 MG/50ML IV EMUL
INTRAVENOUS | Status: DC | PRN
  Administered 2018-05-24: 125 ug/kg/min via INTRAVENOUS

## 2018-05-24 MED ORDER — PROPOFOL 10 MG/ML IV BOLUS
INTRAVENOUS | Status: DC | PRN
  Administered 2018-05-24 (×2): 30 mg via INTRAVENOUS
  Administered 2018-05-24: 20 mg via INTRAVENOUS

## 2018-05-24 MED ORDER — PROPOFOL 10 MG/ML IV BOLUS
INTRAVENOUS | Status: AC
  Filled 2018-05-24: qty 60

## 2018-05-24 MED ORDER — LIDOCAINE 2% (20 MG/ML) 5 ML SYRINGE
INTRAMUSCULAR | Status: DC | PRN
  Administered 2018-05-24: 30 mg via INTRAVENOUS

## 2018-05-24 MED ORDER — LACTATED RINGERS IV SOLN
INTRAVENOUS | Status: DC | PRN
  Administered 2018-05-24: 08:00:00 via INTRAVENOUS

## 2018-05-24 MED ORDER — SODIUM CHLORIDE 0.9 % IV SOLN: INTRAVENOUS | Status: DC

## 2018-05-24 NOTE — Transfer of Care (Signed)
Immediate Anesthesia Transfer of Care Note  Patient: James Zuniga  Procedure(s) Performed: ENTEROSCOPY (N/A ) HOT HEMOSTASIS (ARGON PLASMA COAGULATION/BICAP) (N/A ) POLYPECTOMY HEMOSTASIS CLIP PLACEMENT  Patient Location: Endoscopy Unit  Anesthesia Type:MAC  Level of Consciousness: awake and patient cooperative  Airway & Oxygen Therapy: Patient Spontanous Breathing and Patient connected to nasal cannula oxygen  Post-op Assessment: Report given to RN and Post -op Vital signs reviewed and stable  Post vital signs: Reviewed and stable  Last Vitals:  Vitals Value Taken Time  BP 100/61 05/24/2018  9:32 AM  Temp    Pulse 63 05/24/2018  9:33 AM  Resp 24 05/24/2018  9:33 AM  SpO2 97 % 05/24/2018  9:33 AM  Vitals shown include unvalidated device data.  Last Pain:  Vitals:   05/24/18 0758  TempSrc: Oral  PainSc: 0-No pain         Complications: No apparent anesthesia complications

## 2018-05-24 NOTE — Discharge Instructions (Signed)
YOU HAD AN ENDOSCOPIC PROCEDURE TODAY: Refer to the procedure report and other information in the discharge instructions given to you for any specific questions about what was found during the examination. If this information does not answer your questions, please call [facility] office at [phone] to clarify.   YOU SHOULD EXPECT: Some feelings of bloating in the abdomen. Passage of more gas than usual. Walking can help get rid of the air that was put into your GI tract during the procedure and reduce the bloating. If you had a lower endoscopy (such as a colonoscopy or flexible sigmoidoscopy) you may notice spotting of blood in your stool or on the toilet paper. Some abdominal soreness may be present for a day or two, also.  DIET: Your first meal following the procedure should be a light meal and then it is ok to progress to your normal diet. A half-sandwich or bowl of soup is an example of a good first meal. Heavy or fried foods are harder to digest and may make you feel nauseous or bloated. Drink plenty of fluids but you should avoid alcoholic beverages for 24 hours. If you had a esophageal dilation, please see attached instructions for diet.    ACTIVITY: Your care partner should take you home directly after the procedure. You should plan to take it easy, moving slowly for the rest of the day. You can resume normal activity the day after the procedure however YOU SHOULD NOT DRIVE, use power tools, machinery or perform tasks that involve climbing or major physical exertion for 24 hours (because of the sedation medicines used during the test).   SYMPTOMS TO REPORT IMMEDIATELY: A gastroenterologist can be reached at any hour. Please call [phone]  for any of the following symptoms:   Following upper endoscopy (EGD, EUS, ERCP, esophageal dilation) Vomiting of blood or coffee ground material  New, significant abdominal pain  New, significant chest pain or pain under the shoulder blades  Painful or  persistently difficult swallowing  New shortness of breath  Black, tarry-looking or red, bloody stools  FOLLOW UP:  If any biopsies were taken you will be contacted by phone or by letter within the next 1-3 weeks. Call [phone]  if you have not heard about the biopsies in 3 weeks.  Please also call with any specific questions about appointments or follow up tests.  

## 2018-05-24 NOTE — Anesthesia Preprocedure Evaluation (Addendum)
Anesthesia Evaluation  Patient identified by MRN, date of birth, ID band Patient awake    Reviewed: Allergy & Precautions, NPO status , Patient's Chart, lab work & pertinent test results, reviewed documented beta blocker date and time   Airway Mallampati: II  TM Distance: >3 FB Neck ROM: Full    Dental  (+) Dental Advisory Given, Upper Dentures, Missing, Poor Dentition   Pulmonary sleep apnea , COPD,  COPD inhaler and oxygen dependent, former smoker,    Pulmonary exam normal breath sounds clear to auscultation       Cardiovascular hypertension, Pt. on medications and Pt. on home beta blockers + CAD, + Peripheral Vascular Disease and +CHF  Normal cardiovascular exam Rhythm:Regular Rate:Normal     Neuro/Psych PSYCHIATRIC DISORDERS CVA, Residual Symptoms    GI/Hepatic GERD  ,Hepatic hemangioma IDA, Small Bowel AVMs, Hemangiomas   Endo/Other  diabetes, Insulin DependentObesity   Renal/GU negative Renal ROS     Musculoskeletal  (+) Arthritis ,   Abdominal   Peds  Hematology  (+) Blood dyscrasia (Plavix), ,   Anesthesia Other Findings Day of surgery medications reviewed with the patient.  Reproductive/Obstetrics                            Anesthesia Physical Anesthesia Plan  ASA: IV  Anesthesia Plan: MAC   Post-op Pain Management:    Induction: Intravenous  PONV Risk Score and Plan: 1 and Propofol infusion and Treatment may vary due to age or medical condition  Airway Management Planned: Nasal Cannula and Natural Airway  Additional Equipment:   Intra-op Plan:   Post-operative Plan:   Informed Consent: I have reviewed the patients History and Physical, chart, labs and discussed the procedure including the risks, benefits and alternatives for the proposed anesthesia with the patient or authorized representative who has indicated his/her understanding and acceptance.   Dental  advisory given  Plan Discussed with: CRNA and Anesthesiologist  Anesthesia Plan Comments:         Anesthesia Quick Evaluation

## 2018-05-24 NOTE — Op Note (Signed)
[facility] Patient Name: James Zuniga Procedure Date: 05/24/2018 MRN: [id number] Attending MD: Gerrit Heck , MD Date of Birth: [birth year] CSN: [id number] Age: 62 Admit Type: Outpatient Procedure:                Small bowel enteroscopy Indications:              Abnormal video capsule endoscopy, Suspected upper                            gastrointestinal bleeding in patient with                            unexplained iron deficiency anemia, Obscure                            gastrointestinal bleeding Providers:                Gerrit Heck, MD, Carlyn Reichert, RN, Charolette Child, Technician, Rosario Adie, CRNA, Dellie Catholic Referring MD:              Medicines:                Monitored Anesthesia Care Complications:            No immediate complications. Estimated Blood Loss:     Estimated blood loss was minimal. Procedure:                Pre-Anesthesia Assessment:                           - Prior to the procedure, a History and Physical                            was performed, and patient medications and                            allergies were reviewed. The patient's tolerance of                            previous anesthesia was also reviewed. The risks                            and benefits of the procedure and the sedation                            options and risks were discussed with the patient.                            All questions were answered, and informed consent                            was obtained. Prior Anticoagulants: The patient has  taken Plavix (clopidogrel), last dose was 5 days                            prior to procedure. ASA Grade Assessment: IV - A                            patient with severe systemic disease that is a                            constant threat to life. After reviewing the risks                            and benefits, the  patient was deemed in                            satisfactory condition to undergo the procedure.                           After obtaining informed consent, the endoscope was                            passed under direct vision. Throughout the                            procedure, the patient's blood pressure, pulse, and                            oxygen saturations were monitored continuously. The                            PCF-H190DL ([id number] Olympus peds colonoscope was                            introduced through the mouth and advanced to the                            mid-jejunum. The small bowel enteroscopy was                            accomplished without difficulty. The patient                            tolerated the procedure well. Scope In: Scope Out: Findings:      The examined esophagus was normal.      A single 4 mm sessile polyp with no bleeding and no stigmata of recent       bleeding was found in the gastric antrum. The polyp was removed with a       cold snare. Resection and retrieval were complete. For hemostasis, one       hemostatic clip was successfully placed. There was no bleeding at the       end of the procedure.      The cardia, gastric fundus, gastric body and pylorus were normal.      A single angioectasia with no  bleeding was found in the fourth portion       of the duodenum. Coagulation for hemostasis using argon plasma at 2       liters/minute and 20 watts was successful.      Three angioectasias with no bleeding were found in the proximal jejunum       and in the mid-jejunum. Coagulation for bleeding prevention using argon       plasma at 2 liters/minute and 20 watts was successful. Estimated blood       loss: none.      There was no evidence of significant pathology in the duodenal bulb, in       the first portion of the duodenum, in the second portion of the duodenum       and in the third portion of the duodenum. Impression:               -  Normal esophagus.                           - A single gastric polyp. Resected and retrieved.                            Clip was placed.                           - Normal cardia, gastric fundus, gastric body and                            pylorus.                           - A single non-bleeding angioectasia in the                            duodenum. Treated with argon plasma coagulation                            (APC).                           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                            Treated with argon plasma coagulation (APC).                           - Normal duodenal bulb, first portion of the                            duodenum, second portion of the duodenum and third                            portion of the duodenum. Recommendation:           - Discharge patient to home (with escort).                           - Resume previous diet today.                           -  Resume aspirin at prior dose tomorrow.                           - Resume Plavix (clopidogrel) at prior dose in 5                            days.                           - Await pathology results.                           - Return to GI clinic at appointment to be                            scheduled.                           - Repeat CBC and iron panel in 2 months. Procedure Code(s):        --- Professional ---                           [phone], 65, Small intestinal endoscopy, enteroscopy                            beyond second portion of duodenum, not including                            ileum; with control of bleeding (eg, injection,                            bipolar cautery, unipolar cautery, laser, heater                            probe, stapler, plasma coagulator)                           44364, Small intestinal endoscopy, enteroscopy                            beyond second portion of duodenum, not including                            ileum; with removal of  tumor(s), polyp(s), or other                            lesion(s) by snare technique Diagnosis Code(s):        --- Professional ---                           K31.7, Polyp of stomach and duodenum                           K31.819, Angiodysplasia of stomach and duodenum  without bleeding                           K55.20, Angiodysplasia of colon without hemorrhage                           D50.9, Iron deficiency anemia, unspecified                           K92.2, Gastrointestinal hemorrhage, unspecified                           R93.3, Abnormal findings on diagnostic imaging of                            other parts of digestive tract CPT copyright 2018 [employer]. All rights reserved. The codes documented in this report are preliminary and upon coder review may  be revised to meet current compliance requirements. Gerrit Heck, MD 05/24/2018 9:38:51 AM Number of Addenda: 0

## 2018-05-24 NOTE — Anesthesia Postprocedure Evaluation (Signed)
Anesthesia Post Note  Patient: [name]  Procedure(s) Performed: ENTEROSCOPY (N/A ) HOT HEMOSTASIS (ARGON PLASMA COAGULATION/BICAP) (N/A ) POLYPECTOMY HEMOSTASIS CLIP PLACEMENT     Patient location during evaluation: Endoscopy Anesthesia Type: MAC Level of consciousness: awake and alert, oriented and awake Pain management: pain level controlled Vital Signs Assessment: post-procedure vital signs reviewed and stable Respiratory status: spontaneous breathing, nonlabored ventilation and respiratory function stable Cardiovascular status: stable and blood pressure returned to baseline Postop Assessment: no apparent nausea or vomiting Anesthetic complications: no    Last Vitals:  Vitals:   05/24/18 0950 05/24/18 0955  BP: 98/63 102/70  Pulse: 62 (!) 58  Resp: 16 15  Temp:    SpO2: 100% 95%    Last Pain:  Vitals:   05/24/18 0940  TempSrc:   PainSc: 0-No pain                 Catalina Gravel

## 2018-05-24 NOTE — H&P (Signed)
P  Chief Complaint:      GI History: James Zuniga a 62 y.o.malewith a long-standing history of IDA since 2014, presenting today for push enteroscopy. He was last seen by me on 04/13/18. He has been following with hematology with iron infusions in 2015. He had subsequent normalization of hemoglobin/hematocrit and iron indices through June 2018, but was admitted in February 2019 with fatigue, FOBT positive stools, iron deficiency anemia, requiring blood transfusion. EGD at that time only notable for a small antral hyperplastic polyp,and colonoscopy without any evidence of recent or active bleeding. He continues to follow in hematology clinic with regular iron infusions (last infusion 04/07/2018) and last PRBC transfusion in July. He was initially seen by me on 03/18/18 for intermittent melena and recent FOBT positive stool, and referred for VCE.  VCE completed last month and notable for 2 small AVMs in the proximal small bowel and otherwise suboptimal prep so unable to rule out additional small lesions in the small bowel.  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  Biopsy of 1 of these lesions in 2014 confirmed hemangioma.  Radiologist recommended repeat MRI in 3 to 6 months to evaluate for stability.  PET scan in 2015 was otherwise negative.  Otherwise, no known personal or family history of hepatobiliary disease.  Repeat MRI ordered at last appointment.  HPI:     Patient is a 62 y.o. male with a history of iron deficiency anemia with positive VCE presented today for push enteroscopy for diagnostic and therapeutic intent.  He is otherwise in his usual state of health.  No changes in medical history, surgical history, medications, allergies, social history since his last appointment with me.  Does continue to endorse fatigue.  Most recent labs on 05/04/2018 notable for ferritin 30, iron 27, TIBC 346,  iron sat 8%, H/H 8.3/29.1, normal liver enzymes (aside from ALP 142), creatinine 1.36, potassium 3.2 with otherwise normal BMP.  Was seen by hematology on 10/22 with repeat IV iron at that time and repeat 1 week later.    Review of systems:     No chest pain, no SOB, no fevers, no urinary sx   Past Medical History:  Diagnosis Date  . Arthritis    "all over" (08/24/2017)  . CHF (congestive heart failure) ([place])   . Chronic airway obstruction ([place])   . Chronic lower back pain   . Coronary artery disease   . Disorder of liver    "spots on my liver; they've rechecked them; they haven't changed" (08/24/2017)  . Esophageal reflux   . Gout    "on daily RX" (08/24/2017)  . Hyperlipidemia   . Hypertension   . Hypokalemia 11/2016  . Hypotestosteronism 07/01/2013  . Insomnia   . Iron deficiency anemia, unspecified 07/01/2013  . Memory loss   . On home oxygen therapy    "2L prn" (08/24/2017)  . OSA on CPAP   . PVD (peripheral vascular disease) ([place])   . Renal disorder   . Stroke [facility]) 01/2017   "left sided weakness remains" (08/24/2017)  . Type II diabetes mellitus ([place])   . Vitamin D deficiency     Patient's surgical history, family medical history, social history, medications and allergies were all reviewed in Epic    Current Facility-Administered Medications  Medication Dose Route Frequency Provider Last Rate Last Dose  . 0.9 %  sodium chloride infusion   Intravenous Continuous  Lavena Bullion, DO        Physical Exam:     BP 111/76   Pulse 60   Temp 98.3 F (36.8 C) (Oral)   Resp 20   Ht _0  (1.676 m)   Wt 103 kg   SpO2 95%   BMI 36.64 kg/m   GENERAL:  Pleasant male in NAD PSYCH: : Cooperative, normal affect EENT:  conjunctiva pink, mucous membranes moist, neck supple without masses CARDIAC:  RRR, no murmur heard, no peripheral edema PULM: Normal respiratory effort, lungs CTA bilaterally, no wheezing ABDOMEN:  Nondistended, soft, nontender. No obvious masses,  no hepatomegaly,  normal bowel sounds SKIN:  turgor, no lesions seen Musculoskeletal:  Normal muscle tone, normal strength NEURO: Alert and oriented x 3, no focal neurologic deficits   IMPRESSION and PLAN:    #1.  Iron Deficiency Anemia: Iron deficiency anemia positive VCE in the proximal small bowel. - Plan for push enteroscopy with MAC today for diagnostic and therapeutic intent.     #2.  Hepatic hemangioma: Has repeat MRI scheduled for evaluation of stability of appearance in size.  To follow-up on the results at time of follow-up  The indications, risks, and benefits of enteroscopy were explained to the patient and wife in detail. Risks include but are not limited to bleeding, perforation, adverse reaction to medications, and cardiopulmonary compromise. Sequelae include but are not limited to the possibility of surgery, hositalization, and mortality. The patient verbalized understanding and wished to proceed. All questions answered, referred to scheduler. Further recommendations pending results of the exam.        Lavena Bullion ,DO, FACG 05/24/2018, 8:41 AM

## 2018-05-25 ENCOUNTER — Encounter (HOSPITAL_COMMUNITY): Payer: Self-pay | Admitting: Gastroenterology

## 2018-05-27 ENCOUNTER — Encounter: Payer: Self-pay | Admitting: Gastroenterology

## 2018-05-27 DIAGNOSIS — Z9989 Dependence on other enabling machines and devices: Secondary | ICD-10-CM | POA: Diagnosis not present

## 2018-05-27 DIAGNOSIS — G4733 Obstructive sleep apnea (adult) (pediatric): Secondary | ICD-10-CM | POA: Diagnosis not present

## 2018-05-31 DIAGNOSIS — J449 Chronic obstructive pulmonary disease, unspecified: Secondary | ICD-10-CM | POA: Diagnosis not present

## 2018-06-03 DIAGNOSIS — E538 Deficiency of other specified B group vitamins: Secondary | ICD-10-CM | POA: Diagnosis not present

## 2018-06-07 ENCOUNTER — Other Ambulatory Visit: Payer: Self-pay

## 2018-06-07 ENCOUNTER — Inpatient Hospital Stay: Payer: PPO

## 2018-06-07 ENCOUNTER — Telehealth: Payer: Self-pay | Admitting: Hematology & Oncology

## 2018-06-07 ENCOUNTER — Inpatient Hospital Stay: Payer: PPO | Attending: Family | Admitting: Family

## 2018-06-07 ENCOUNTER — Encounter: Payer: Self-pay | Admitting: Family

## 2018-06-07 DIAGNOSIS — E291 Testicular hypofunction: Secondary | ICD-10-CM | POA: Diagnosis not present

## 2018-06-07 DIAGNOSIS — D631 Anemia in chronic kidney disease: Secondary | ICD-10-CM | POA: Diagnosis not present

## 2018-06-07 DIAGNOSIS — N189 Chronic kidney disease, unspecified: Secondary | ICD-10-CM | POA: Insufficient documentation

## 2018-06-07 DIAGNOSIS — K769 Liver disease, unspecified: Secondary | ICD-10-CM | POA: Diagnosis not present

## 2018-06-07 DIAGNOSIS — D509 Iron deficiency anemia, unspecified: Secondary | ICD-10-CM | POA: Diagnosis not present

## 2018-06-07 DIAGNOSIS — D500 Iron deficiency anemia secondary to blood loss (chronic): Secondary | ICD-10-CM

## 2018-06-07 LAB — CBC WITH DIFFERENTIAL (CANCER CENTER ONLY)
ABS IMMATURE GRANULOCYTES: 0.02 10*3/uL (ref 0.00–0.07)
Basophils Absolute: 0.1 10*3/uL (ref 0.0–0.1)
Basophils Relative: 1 %
EOS PCT: 4 %
Eosinophils Absolute: 0.2 10*3/uL (ref 0.0–0.5)
HCT: 35.1 % — ABNORMAL LOW (ref 39.0–52.0)
HEMOGLOBIN: 10.5 g/dL — AB (ref 13.0–17.0)
Immature Granulocytes: 0 %
LYMPHS PCT: 30 %
Lymphs Abs: 1.8 10*3/uL (ref 0.7–4.0)
MCH: 25.4 pg — AB (ref 26.0–34.0)
MCHC: 29.9 g/dL — AB (ref 30.0–36.0)
MCV: 84.8 fL (ref 80.0–100.0)
MONO ABS: 0.4 10*3/uL (ref 0.1–1.0)
MONOS PCT: 7 %
NEUTROS ABS: 3.3 10*3/uL (ref 1.7–7.7)
Neutrophils Relative %: 58 %
Platelet Count: 362 10*3/uL (ref 150–400)
RBC: 4.14 MIL/uL — AB (ref 4.22–5.81)
RDW: 17.2 % — ABNORMAL HIGH (ref 11.5–15.5)
WBC: 5.8 10*3/uL (ref 4.0–10.5)
nRBC: 0 % (ref 0.0–0.2)

## 2018-06-07 LAB — CMP (CANCER CENTER ONLY)
ALBUMIN: 3.3 g/dL — AB (ref 3.5–5.0)
ALK PHOS: 140 U/L — AB (ref 26–84)
ALT: 25 U/L (ref 10–47)
AST: 27 U/L (ref 11–38)
Anion gap: 4 — ABNORMAL LOW (ref 5–15)
BUN: 10 mg/dL (ref 7–22)
CHLORIDE: 106 mmol/L (ref 98–108)
CO2: 31 mmol/L (ref 18–33)
CREATININE: 1.4 mg/dL — AB (ref 0.60–1.20)
Calcium: 10 mg/dL (ref 8.0–10.3)
GLUCOSE: 170 mg/dL — AB (ref 73–118)
POTASSIUM: 3 mmol/L — AB (ref 3.3–4.7)
SODIUM: 141 mmol/L (ref 128–145)
Total Bilirubin: 0.5 mg/dL (ref 0.2–1.6)
Total Protein: 8 g/dL (ref 6.4–8.1)

## 2018-06-07 LAB — IRON AND TIBC
Iron: 34 ug/dL — ABNORMAL LOW (ref 42–163)
Saturation Ratios: 11 % — ABNORMAL LOW (ref 20–55)
TIBC: 301 ug/dL (ref 202–409)
UIBC: 268 ug/dL (ref 117–376)

## 2018-06-07 LAB — RETICULOCYTES
Immature Retic Fract: 23.3 % — ABNORMAL HIGH (ref 2.3–15.9)
RBC.: 4.14 MIL/uL — AB (ref 4.22–5.81)
RETIC COUNT ABSOLUTE: 76.6 10*3/uL (ref 19.0–186.0)
RETIC CT PCT: 1.9 % (ref 0.4–3.1)

## 2018-06-07 LAB — SAMPLE TO BLOOD BANK

## 2018-06-07 LAB — FERRITIN: Ferritin: 52 ng/mL (ref 24–336)

## 2018-06-07 NOTE — Progress Notes (Signed)
Hematology and Oncology Follow Up Visit  James Zuniga [id number] 1955/07/31 62 y.o. 06/07/2018   Principle Diagnosis:  Iron deficiency anemia Hypotestosteronemia Chronic liver lesions  Of Note: History of stroke in June 2018, no more Testosterone or ESA's  Current Therapy:   IV iron as indicated   Interim History:  James Zuniga is here today with his wife for follow-up. He is having some fatigue, chills, dizziness and SOB with over exertion. Small bowel endoscopy with Dr. Bryan Lemma revealed 4 non bleeding angioectasias that were treated with argon plasma coagulation as well as removal of 1 benign polyp. He denies noting any blood loss. No bruising or petechiae.  No fever, n/v, cough, rash, chest pain, palpitations, abdominal pain or changes in bowel or bladder habits.  Swelling in his lower extremities is stable.  Has lower back pain that waxes and wanes. This is unchanged. He also has some associated numbness and tingling in the right lower extremity.  No lymphadenopathy noted on exam.  He has maintained a good appetite and is sataing well hydrated. His weight is stable.   ECOG Performance Status: 1 - Symptomatic but completely ambulatory  Medications:  Allergies as of 06/07/2018   No Known Allergies     Medication List        Accurate as of 06/07/18  9:28 AM. Always use your most recent med list.          albuterol (2.5 MG/3ML) 0.083% nebulizer solution Commonly known as:  PROVENTIL Take 2.5 mg by nebulization every 6 (six) hours as needed for wheezing or shortness of breath.   allopurinol 300 MG tablet Commonly known as:  ZYLOPRIM Take 300 mg by mouth daily.   amLODipine 5 MG tablet Commonly known as:  NORVASC Take 5 mg by mouth daily.   aspirin 81 MG EC tablet Take 81 mg by mouth daily. Swallow whole.   atorvastatin 80 MG tablet Commonly known as:  LIPITOR Take 1 tablet (80 mg total) by mouth every morning.   clopidogrel 75 MG tablet Commonly known as:   PLAVIX Take 1 tablet (75 mg total) by mouth daily.   DULoxetine 60 MG capsule Commonly known as:  CYMBALTA Take 60 mg by mouth 2 (two) times daily.   furosemide 20 MG tablet Commonly known as:  LASIX Take 20 mg by mouth every Tuesday, Thursday, and Saturday at 6 PM.   gabapentin 300 MG capsule Commonly known as:  NEURONTIN Take 300 mg by mouth 4 (four) times daily.   insulin NPH-regular Human (70-30) 100 UNIT/ML injection Please inject 25 units twice daily before breakfast and dinner.   ipratropium-albuterol 0.5-2.5 (3) MG/3ML Soln Commonly known as:  DUONEB Take 3 mLs by nebulization every 6 (six) hours as needed. DX: J44.9   losartan 100 MG tablet Commonly known as:  COZAAR Take 100 mg by mouth daily.   metoprolol succinate 25 MG 24 hr tablet Commonly known as:  TOPROL-XL Take 1 tablet (25 mg total) by mouth daily.   metoprolol tartrate 25 MG tablet Commonly known as:  LOPRESSOR TAKE 2 TABLETS BY MOUTH TWICE DAILY   onetouch ultrasoft lancets USE 1 LANCET TO CHECK GLUCOSE IN THE MORNING BEFORE BREAKFAST   pantoprazole 20 MG tablet Commonly known as:  PROTONIX Take 20 mg by mouth daily.   potassium chloride SA 20 MEQ tablet Commonly known as:  K-DUR,KLOR-CON Take 1 tablet (20 mEq total) by mouth 2 (two) times daily.   PRECISION QID TEST test strip Generic drug:  glucose  blood 1 each by Misc.(Non-Drug; Combo Route) route daily.   primidone 50 MG tablet Commonly known as:  MYSOLINE Take 100 mg by mouth at bedtime.   tamsulosin 0.4 MG Caps capsule Commonly known as:  FLOMAX Take 1 capsule (0.4 mg total) by mouth daily after breakfast.   tiZANidine 4 MG tablet Commonly known as:  ZANAFLEX Take 4 mg by mouth 3 (three) times daily as needed for muscle spasms.   traMADol 50 MG tablet Commonly known as:  ULTRAM Take 50 mg by mouth daily as needed for moderate pain.   VITAMIN B-12 IJ Inject 1,000 mg as directed every 30 (thirty) days. X 4   zolpidem 10 MG  tablet Commonly known as:  AMBIEN Take 10 mg by mouth at bedtime.       Allergies: No Known Allergies  Past Medical History, Surgical history, Social history, and Family History were reviewed and updated.  Review of Systems: All other 10 point review of systems is negative.   Physical Exam:  vitals were not taken for this visit.   Wt Readings from Last 3 Encounters:  05/24/18 227 lb (103 kg)  05/03/18 232 lb (105.2 kg)  04/13/18 234 lb (106.1 kg)    Ocular: Sclerae unicteric, pupils equal, round and reactive to light Ear-nose-throat: Oropharynx clear, dentition fair Lymphatic: No cervical, supraclavicular or axillary adenopathy Lungs no rales or rhonchi, good excursion bilaterally Heart regular rate and rhythm, no murmur appreciated Abd soft, nontender, positive bowel sounds, no liver or spleen tip palpated on exam, no fluid wave  MSK no focal spinal tenderness, no joint edema Neuro: non-focal, well-oriented, appropriate affect Breasts: Deferred  Lab Results  Component Value Date   WBC 5.8 06/07/2018   HGB 10.5 (L) 06/07/2018   HCT 35.1 (L) 06/07/2018   MCV 84.8 06/07/2018   PLT 362 06/07/2018   Lab Results  Component Value Date   FERRITIN 30 05/03/2018   IRON 27 (L) 05/03/2018   TIBC 346 05/03/2018   UIBC 320 05/03/2018   IRONPCTSAT 8 (L) 05/03/2018   Lab Results  Component Value Date   RETICCTPCT 1.9 06/07/2018   RBC 4.14 (L) 06/07/2018   RBC 4.14 (L) 06/07/2018   RETICCTABS 63.0 06/20/2015   No results found for: Nils Pyle [facility] Lab Results  Component Value Date   IGA 139 08/26/2017   No results found for: Odetta Pink, SPEI   Chemistry      Component Value Date/Time   NA 142 05/03/2018 0825   NA 141 09/25/2016 0809   K 3.2 (L) 05/03/2018 0825   K 2.8 (LL) 09/25/2016 0809   CL 103 05/03/2018 0825   CL 102 07/31/2016 0815   CO2 29 05/03/2018 0825   CO2 28 09/25/2016  0809   BUN 14 05/03/2018 0825   BUN 13.1 09/25/2016 0809   CREATININE 1.36 (H) 05/03/2018 0825   CREATININE 1.2 09/25/2016 0809      Component Value Date/Time   CALCIUM 10.2 05/03/2018 0825   CALCIUM 9.5 09/25/2016 0809   ALKPHOS 142 (H) 05/03/2018 0825   ALKPHOS 126 09/25/2016 0809   AST 19 05/03/2018 0825   AST 19 09/25/2016 0809   ALT 21 05/03/2018 0825   ALT 23 09/25/2016 0809   BILITOT 0.2 (L) 05/03/2018 0825   BILITOT 0.45 09/25/2016 0809       Impression and Plan: Mr. Dieudonne is a very pleasant 62 yo African American gentleman with both iron deficiency anemia and erythropoietin  deficiency anemia.  He is symptomatic with fatigue, chills, dizziness and SOB with exertion.  Hgb is 10.5, MCV 84.8.  We will see what his iron studies show and bring him back in for infusion if needed.  We will plan to see him in another month.  They will contact our office with any questions or concerns. We can certainly see him sooner if need be.    Laverna Peace, NP 11/26/20199:28 AM

## 2018-06-07 NOTE — Telephone Encounter (Signed)
Scheduled patient notified per 11/26 sch msg

## 2018-06-14 ENCOUNTER — Inpatient Hospital Stay: Payer: PPO | Attending: Family

## 2018-06-14 VITALS — BP 120/80 | HR 82 | Temp 98.4°F | Resp 18

## 2018-06-14 DIAGNOSIS — Z8673 Personal history of transient ischemic attack (TIA), and cerebral infarction without residual deficits: Secondary | ICD-10-CM | POA: Diagnosis not present

## 2018-06-14 DIAGNOSIS — D509 Iron deficiency anemia, unspecified: Secondary | ICD-10-CM | POA: Insufficient documentation

## 2018-06-14 DIAGNOSIS — N189 Chronic kidney disease, unspecified: Secondary | ICD-10-CM | POA: Insufficient documentation

## 2018-06-14 DIAGNOSIS — D631 Anemia in chronic kidney disease: Secondary | ICD-10-CM | POA: Diagnosis not present

## 2018-06-14 MED ORDER — SODIUM CHLORIDE 0.9 % IV SOLN
INTRAVENOUS | Status: DC
  Administered 2018-06-14: 13:00:00 via INTRAVENOUS
  Filled 2018-06-14: qty 250

## 2018-06-14 MED ORDER — SODIUM CHLORIDE 0.9 % IV SOLN
510.0000 mg | Freq: Once | INTRAVENOUS | Status: AC
  Administered 2018-06-14: 510 mg via INTRAVENOUS
  Filled 2018-06-14: qty 17

## 2018-06-14 NOTE — Patient Instructions (Signed)

## 2018-06-23 DIAGNOSIS — J449 Chronic obstructive pulmonary disease, unspecified: Secondary | ICD-10-CM | POA: Diagnosis not present

## 2018-06-23 DIAGNOSIS — G4733 Obstructive sleep apnea (adult) (pediatric): Secondary | ICD-10-CM | POA: Diagnosis not present

## 2018-06-30 DIAGNOSIS — J449 Chronic obstructive pulmonary disease, unspecified: Secondary | ICD-10-CM | POA: Diagnosis not present

## 2018-07-01 DIAGNOSIS — E538 Deficiency of other specified B group vitamins: Secondary | ICD-10-CM | POA: Diagnosis not present

## 2018-07-01 DIAGNOSIS — E7849 Other hyperlipidemia: Secondary | ICD-10-CM | POA: Diagnosis not present

## 2018-07-01 DIAGNOSIS — E1100 Type 2 diabetes mellitus with hyperosmolarity without nonketotic hyperglycemic-hyperosmolar coma (NKHHC): Secondary | ICD-10-CM | POA: Diagnosis not present

## 2018-07-01 DIAGNOSIS — E876 Hypokalemia: Secondary | ICD-10-CM | POA: Diagnosis not present

## 2018-07-01 DIAGNOSIS — F5104 Psychophysiologic insomnia: Secondary | ICD-10-CM | POA: Diagnosis not present

## 2018-07-01 DIAGNOSIS — Z8673 Personal history of transient ischemic attack (TIA), and cerebral infarction without residual deficits: Secondary | ICD-10-CM | POA: Diagnosis not present

## 2018-07-01 DIAGNOSIS — I10 Essential (primary) hypertension: Secondary | ICD-10-CM | POA: Diagnosis not present

## 2018-07-07 ENCOUNTER — Encounter: Payer: Self-pay | Admitting: Family

## 2018-07-07 ENCOUNTER — Telehealth: Payer: Self-pay | Admitting: *Deleted

## 2018-07-07 ENCOUNTER — Telehealth: Payer: Self-pay | Admitting: Family

## 2018-07-07 ENCOUNTER — Other Ambulatory Visit: Payer: Self-pay

## 2018-07-07 ENCOUNTER — Inpatient Hospital Stay (HOSPITAL_BASED_OUTPATIENT_CLINIC_OR_DEPARTMENT_OTHER): Payer: PPO | Admitting: Family

## 2018-07-07 ENCOUNTER — Inpatient Hospital Stay: Payer: PPO

## 2018-07-07 VITALS — BP 127/88 | HR 52 | Temp 98.3°F | Resp 16 | Wt 229.8 lb

## 2018-07-07 DIAGNOSIS — Z8673 Personal history of transient ischemic attack (TIA), and cerebral infarction without residual deficits: Secondary | ICD-10-CM | POA: Diagnosis not present

## 2018-07-07 DIAGNOSIS — D631 Anemia in chronic kidney disease: Secondary | ICD-10-CM

## 2018-07-07 DIAGNOSIS — D509 Iron deficiency anemia, unspecified: Secondary | ICD-10-CM | POA: Diagnosis not present

## 2018-07-07 DIAGNOSIS — N189 Chronic kidney disease, unspecified: Secondary | ICD-10-CM | POA: Diagnosis not present

## 2018-07-07 DIAGNOSIS — D500 Iron deficiency anemia secondary to blood loss (chronic): Secondary | ICD-10-CM

## 2018-07-07 DIAGNOSIS — Z9889 Other specified postprocedural states: Secondary | ICD-10-CM | POA: Diagnosis not present

## 2018-07-07 LAB — CBC WITH DIFFERENTIAL (CANCER CENTER ONLY)
Abs Immature Granulocytes: 0.01 10*3/uL (ref 0.00–0.07)
Basophils Absolute: 0 10*3/uL (ref 0.0–0.1)
Basophils Relative: 1 %
Eosinophils Absolute: 0.2 10*3/uL (ref 0.0–0.5)
Eosinophils Relative: 4 %
HEMATOCRIT: 34.3 % — AB (ref 39.0–52.0)
Hemoglobin: 10.2 g/dL — ABNORMAL LOW (ref 13.0–17.0)
Immature Granulocytes: 0 %
Lymphocytes Relative: 23 %
Lymphs Abs: 1.2 10*3/uL (ref 0.7–4.0)
MCH: 25 pg — ABNORMAL LOW (ref 26.0–34.0)
MCHC: 29.7 g/dL — ABNORMAL LOW (ref 30.0–36.0)
MCV: 84.1 fL (ref 80.0–100.0)
Monocytes Absolute: 0.4 10*3/uL (ref 0.1–1.0)
Monocytes Relative: 7 %
Neutro Abs: 3.4 10*3/uL (ref 1.7–7.7)
Neutrophils Relative %: 65 %
Platelet Count: 386 10*3/uL (ref 150–400)
RBC: 4.08 MIL/uL — ABNORMAL LOW (ref 4.22–5.81)
RDW: 17.3 % — ABNORMAL HIGH (ref 11.5–15.5)
WBC Count: 5.2 10*3/uL (ref 4.0–10.5)
nRBC: 0 % (ref 0.0–0.2)

## 2018-07-07 LAB — CMP (CANCER CENTER ONLY)
ALT: 18 U/L (ref 0–44)
AST: 13 U/L — ABNORMAL LOW (ref 15–41)
Albumin: 4 g/dL (ref 3.5–5.0)
Alkaline Phosphatase: 144 U/L — ABNORMAL HIGH (ref 38–126)
Anion gap: 8 (ref 5–15)
BILIRUBIN TOTAL: 0.3 mg/dL (ref 0.3–1.2)
BUN: 10 mg/dL (ref 8–23)
CO2: 29 mmol/L (ref 22–32)
Calcium: 9.1 mg/dL (ref 8.9–10.3)
Chloride: 100 mmol/L (ref 98–111)
Creatinine: 1.33 mg/dL — ABNORMAL HIGH (ref 0.61–1.24)
GFR, Est AFR Am: >60 mL/min (ref >60)
GFR, Estimated: 56 mL/min — ABNORMAL LOW (ref >60)
Glucose, Bld: 237 mg/dL — ABNORMAL HIGH (ref 70–99)
Potassium: 3 mmol/L — CL (ref 3.5–5.1)
Sodium: 137 mmol/L (ref 135–145)
TOTAL PROTEIN: 8 g/dL (ref 6.5–8.1)

## 2018-07-07 LAB — RETICULOCYTES
Immature Retic Fract: 19 % — ABNORMAL HIGH (ref 2.3–15.9)
RBC.: 4.08 MIL/uL — ABNORMAL LOW (ref 4.22–5.81)
Retic Count, Absolute: 90.2 10*3/uL (ref 19.0–186.0)
Retic Ct Pct: 2.2 % (ref 0.4–3.1)

## 2018-07-07 LAB — FERRITIN: Ferritin: 77 ng/mL (ref 24–336)

## 2018-07-07 LAB — IRON AND TIBC
Iron: 38 ug/dL — ABNORMAL LOW (ref 42–163)
Saturation Ratios: 13 % — ABNORMAL LOW (ref 20–55)
TIBC: 298 ug/dL (ref 202–409)
UIBC: 260 ug/dL (ref 117–376)

## 2018-07-07 LAB — SAMPLE TO BLOOD BANK

## 2018-07-07 NOTE — Telephone Encounter (Signed)
Appointment added to schedule and patient was notified per 12/26 sch msg

## 2018-07-07 NOTE — Telephone Encounter (Signed)
Richardson Landry from lab brought a critical lab value to this nurse. Potassium was 3.0. Laverna Peace, NP, notified.

## 2018-07-07 NOTE — Progress Notes (Signed)
Hematology and Oncology Follow Up Visit  James Zuniga [id number] 07-Oct-1955 62 y.o. 07/07/2018   Principle Diagnosis:  Iron deficiency anemia Hypotestosteronemia Chronic liver lesions  Of Note: History of stroke in June 2018, no more Testosterone or ESA's  Current Therapy:   IV iron as indicated   Interim History:  James Zuniga is here today with his sweet wife for follow-up. He is doing well but states that he is symptomatic with fatigue, palpitations and SOB with over exertion (unchanged).  No fever, chills, n/v, cough, rash, chest pain, abdominal pain or changes in bowel or bladder habits.  He takes his lasix daily and his lower extremity swelling is stable.  He has residual left sided weakness after his stroke.  He does stumble and fall at times. He carries his cane for support when ambulating. Thankfully he has not been seriously injured.  The numbness and tingling in his right arm and leg are unchanged.  No lymphadenopathy noted on exam.  He has maintained a good appetite but states that he needs to hydrate better daily. His weight is stable.  His wife states that his blood sugars have been well controlled. He follows up with his PCP tomorrow.   ECOG Performance Status: 1 - Symptomatic but completely ambulatory  Medications:  Allergies as of 07/07/2018   No Known Allergies     Medication List       Accurate as of July 07, 2018  8:43 AM. Always use your most recent med list.        albuterol (2.5 MG/3ML) 0.083% nebulizer solution Commonly known as:  PROVENTIL Take 2.5 mg by nebulization every 6 (six) hours as needed for wheezing or shortness of breath.   allopurinol 300 MG tablet Commonly known as:  ZYLOPRIM Take 300 mg by mouth daily.   amLODipine 5 MG tablet Commonly known as:  NORVASC Take 5 mg by mouth daily.   aspirin 81 MG EC tablet Take 81 mg by mouth daily. Swallow whole.   atorvastatin 80 MG tablet Commonly known as:  LIPITOR Take 1 tablet  (80 mg total) by mouth every morning.   clopidogrel 75 MG tablet Commonly known as:  PLAVIX Take 1 tablet (75 mg total) by mouth daily.   DULoxetine 60 MG capsule Commonly known as:  CYMBALTA Take 60 mg by mouth 2 (two) times daily.   furosemide 20 MG tablet Commonly known as:  LASIX Take 20 mg by mouth every Tuesday, Thursday, and Saturday at 6 PM.   gabapentin 300 MG capsule Commonly known as:  NEURONTIN Take 300 mg by mouth 4 (four) times daily.   insulin NPH-regular Human (70-30) 100 UNIT/ML injection Commonly known as:  NOVOLIN 70/30 Please inject 25 units twice daily before breakfast and dinner.   ipratropium-albuterol 0.5-2.5 (3) MG/3ML Soln Commonly known as:  DUONEB Take 3 mLs by nebulization every 6 (six) hours as needed. DX: J44.9   losartan 100 MG tablet Commonly known as:  COZAAR Take 100 mg by mouth daily.   metoprolol succinate 25 MG 24 hr tablet Commonly known as:  TOPROL-XL Take 1 tablet (25 mg total) by mouth daily.   metoprolol tartrate 25 MG tablet Commonly known as:  LOPRESSOR TAKE 2 TABLETS BY MOUTH TWICE DAILY   onetouch ultrasoft lancets USE 1 LANCET TO CHECK GLUCOSE IN THE MORNING BEFORE BREAKFAST   pantoprazole 20 MG tablet Commonly known as:  PROTONIX Take 20 mg by mouth daily.   potassium chloride SA 20 MEQ tablet Commonly known  as:  K-DUR,KLOR-CON Take 1 tablet (20 mEq total) by mouth 2 (two) times daily.   PRECISION QID TEST test strip Generic drug:  glucose blood 1 each by Misc.(Non-Drug; Combo Route) route daily.   primidone 50 MG tablet Commonly known as:  MYSOLINE Take 100 mg by mouth at bedtime.   tamsulosin 0.4 MG Caps capsule Commonly known as:  FLOMAX Take 1 capsule (0.4 mg total) by mouth daily after breakfast.   tiZANidine 4 MG tablet Commonly known as:  ZANAFLEX Take 4 mg by mouth 3 (three) times daily as needed for muscle spasms.   traMADol 50 MG tablet Commonly known as:  ULTRAM Take 50 mg by mouth daily as  needed for moderate pain.   VITAMIN B-12 IJ Inject 1,000 mg as directed every 30 (thirty) days. X 4   zolpidem 10 MG tablet Commonly known as:  AMBIEN Take 10 mg by mouth at bedtime.       Allergies: No Known Allergies  Past Medical History, Surgical history, Social history, and Family History were reviewed and updated.  Review of Systems: All other 10 point review of systems is negative.   Physical Exam:  vitals were not taken for this visit.   Wt Readings from Last 3 Encounters:  05/24/18 227 lb (103 kg)  05/03/18 232 lb (105.2 kg)  04/13/18 234 lb (106.1 kg)    Ocular: Sclerae unicteric, pupils equal, round and reactive to light Ear-nose-throat: Oropharynx clear, dentition fair Lymphatic: No cervical, supraclavicular or axillary adenopathy Lungs no rales or rhonchi, good excursion bilaterally Heart regular rate and rhythm, no murmur appreciated Abd soft, nontender, positive bowel sounds, no liver or spleen tip palpated on exam, no fluid wave  MSK no focal spinal tenderness, no joint edema Neuro: non-focal, well-oriented, appropriate affect Breasts: Deferred   Lab Results  Component Value Date   WBC 5.2 07/07/2018   HGB 10.2 (L) 07/07/2018   HCT 34.3 (L) 07/07/2018   MCV 84.1 07/07/2018   PLT 386 07/07/2018   Lab Results  Component Value Date   FERRITIN 52 06/07/2018   IRON 34 (L) 06/07/2018   TIBC 301 06/07/2018   UIBC 268 06/07/2018   IRONPCTSAT 11 (L) 06/07/2018   Lab Results  Component Value Date   RETICCTPCT 2.2 07/07/2018   RBC 4.08 (L) 07/07/2018   RBC 4.08 (L) 07/07/2018   RETICCTABS 63.0 06/20/2015   No results found for: Nils Pyle [facility] Lab Results  Component Value Date   IGA 139 08/26/2017   No results found for: Odetta Pink, SPEI   Chemistry      Component Value Date/Time   NA 141 06/07/2018 0901   NA 141 09/25/2016 0809   K 3.0 (LL) 06/07/2018 0901   K  2.8 (LL) 09/25/2016 0809   CL 106 06/07/2018 0901   CL 102 07/31/2016 0815   CO2 31 06/07/2018 0901   CO2 28 09/25/2016 0809   BUN 10 06/07/2018 0901   BUN 13.1 09/25/2016 0809   CREATININE 1.40 (H) 06/07/2018 0901   CREATININE 1.2 09/25/2016 0809      Component Value Date/Time   CALCIUM 10.0 06/07/2018 0901   CALCIUM 9.5 09/25/2016 0809   ALKPHOS 140 (H) 06/07/2018 0901   ALKPHOS 126 09/25/2016 0809   AST 27 06/07/2018 0901   AST 19 09/25/2016 0809   ALT 25 06/07/2018 0901   ALT 23 09/25/2016 0809   BILITOT 0.5 06/07/2018 0901   BILITOT 0.45 09/25/2016 0809  Impression and Plan: James Zuniga is a very pleasant 62 yo African American gentleman with 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  He is symptomatic with fatigue, palpitations and SOB with over exertion.  We will see what his iron studies show and bring him back in for infusion if needed.  Hgb is stable at 10.2, MCV 84 so no blood needed at this time.  We will plan to see him back in another 6 weeks for follow-up.  They will contact our office with any questions or concerns. We can certainly see him sooner if need be.    Laverna Peace, NP 12/26/20198:43 AM

## 2018-07-08 ENCOUNTER — Inpatient Hospital Stay: Payer: PPO

## 2018-07-08 ENCOUNTER — Other Ambulatory Visit: Payer: Self-pay

## 2018-07-08 VITALS — BP 137/82 | HR 60 | Resp 20

## 2018-07-08 DIAGNOSIS — I10 Essential (primary) hypertension: Secondary | ICD-10-CM | POA: Diagnosis not present

## 2018-07-08 DIAGNOSIS — D509 Iron deficiency anemia, unspecified: Secondary | ICD-10-CM | POA: Diagnosis not present

## 2018-07-08 DIAGNOSIS — E538 Deficiency of other specified B group vitamins: Secondary | ICD-10-CM | POA: Diagnosis not present

## 2018-07-08 DIAGNOSIS — E1100 Type 2 diabetes mellitus with hyperosmolarity without nonketotic hyperglycemic-hyperosmolar coma (NKHHC): Secondary | ICD-10-CM | POA: Diagnosis not present

## 2018-07-08 DIAGNOSIS — E7849 Other hyperlipidemia: Secondary | ICD-10-CM | POA: Diagnosis not present

## 2018-07-08 MED ORDER — SODIUM CHLORIDE 0.9 % IV SOLN
Freq: Once | INTRAVENOUS | Status: AC
  Administered 2018-07-08: 11:00:00 via INTRAVENOUS
  Filled 2018-07-08: qty 250

## 2018-07-08 MED ORDER — SODIUM CHLORIDE 0.9 % IV SOLN
510.0000 mg | Freq: Once | INTRAVENOUS | Status: AC
  Administered 2018-07-08: 510 mg via INTRAVENOUS
  Filled 2018-07-08: qty 17

## 2018-07-08 NOTE — Patient Instructions (Signed)

## 2018-07-27 DIAGNOSIS — J449 Chronic obstructive pulmonary disease, unspecified: Secondary | ICD-10-CM | POA: Diagnosis not present

## 2018-08-09 DIAGNOSIS — I10 Essential (primary) hypertension: Secondary | ICD-10-CM | POA: Diagnosis not present

## 2018-08-09 DIAGNOSIS — R5383 Other fatigue: Secondary | ICD-10-CM | POA: Diagnosis not present

## 2018-08-09 DIAGNOSIS — R5381 Other malaise: Secondary | ICD-10-CM | POA: Diagnosis not present

## 2018-08-09 DIAGNOSIS — I428 Other cardiomyopathies: Secondary | ICD-10-CM | POA: Diagnosis not present

## 2018-08-09 DIAGNOSIS — Z95818 Presence of other cardiac implants and grafts: Secondary | ICD-10-CM | POA: Diagnosis not present

## 2018-08-09 DIAGNOSIS — Z0389 Encounter for observation for other suspected diseases and conditions ruled out: Secondary | ICD-10-CM | POA: Diagnosis not present

## 2018-08-09 DIAGNOSIS — R0602 Shortness of breath: Secondary | ICD-10-CM | POA: Diagnosis not present

## 2018-08-09 DIAGNOSIS — Z87891 Personal history of nicotine dependence: Secondary | ICD-10-CM | POA: Diagnosis not present

## 2018-08-15 DIAGNOSIS — E876 Hypokalemia: Secondary | ICD-10-CM | POA: Diagnosis not present

## 2018-08-18 ENCOUNTER — Inpatient Hospital Stay: Payer: PPO

## 2018-08-18 ENCOUNTER — Inpatient Hospital Stay: Payer: PPO | Attending: Family | Admitting: Hematology & Oncology

## 2018-08-18 ENCOUNTER — Other Ambulatory Visit: Payer: Self-pay

## 2018-08-18 VITALS — BP 110/86

## 2018-08-18 VITALS — BP 128/86 | HR 72 | Temp 98.2°F | Resp 20 | Wt 233.0 lb

## 2018-08-18 DIAGNOSIS — D510 Vitamin B12 deficiency anemia due to intrinsic factor deficiency: Secondary | ICD-10-CM | POA: Insufficient documentation

## 2018-08-18 DIAGNOSIS — D509 Iron deficiency anemia, unspecified: Secondary | ICD-10-CM | POA: Diagnosis not present

## 2018-08-18 DIAGNOSIS — N189 Chronic kidney disease, unspecified: Secondary | ICD-10-CM | POA: Diagnosis not present

## 2018-08-18 DIAGNOSIS — Z8673 Personal history of transient ischemic attack (TIA), and cerebral infarction without residual deficits: Secondary | ICD-10-CM | POA: Diagnosis not present

## 2018-08-18 DIAGNOSIS — D631 Anemia in chronic kidney disease: Secondary | ICD-10-CM | POA: Diagnosis not present

## 2018-08-18 DIAGNOSIS — D500 Iron deficiency anemia secondary to blood loss (chronic): Secondary | ICD-10-CM

## 2018-08-18 LAB — CBC WITH DIFFERENTIAL (CANCER CENTER ONLY)
Abs Immature Granulocytes: 0.02 10*3/uL (ref 0.00–0.07)
Basophils Absolute: 0.1 10*3/uL (ref 0.0–0.1)
Basophils Relative: 1 %
Eosinophils Absolute: 0.2 10*3/uL (ref 0.0–0.5)
Eosinophils Relative: 4 %
HEMATOCRIT: 31.8 % — AB (ref 39.0–52.0)
Hemoglobin: 9.5 g/dL — ABNORMAL LOW (ref 13.0–17.0)
Immature Granulocytes: 0 %
LYMPHS ABS: 1.3 10*3/uL (ref 0.7–4.0)
Lymphocytes Relative: 22 %
MCH: 24.4 pg — ABNORMAL LOW (ref 26.0–34.0)
MCHC: 29.9 g/dL — ABNORMAL LOW (ref 30.0–36.0)
MCV: 81.5 fL (ref 80.0–100.0)
MONOS PCT: 6 %
Monocytes Absolute: 0.4 10*3/uL (ref 0.1–1.0)
Neutro Abs: 3.9 10*3/uL (ref 1.7–7.7)
Neutrophils Relative %: 67 %
Platelet Count: 418 10*3/uL — ABNORMAL HIGH (ref 150–400)
RBC: 3.9 MIL/uL — ABNORMAL LOW (ref 4.22–5.81)
RDW: 16.8 % — ABNORMAL HIGH (ref 11.5–15.5)
WBC Count: 5.9 10*3/uL (ref 4.0–10.5)
nRBC: 0 % (ref 0.0–0.2)

## 2018-08-18 LAB — CMP (CANCER CENTER ONLY)
ALT: 17 U/L (ref 0–44)
AST: 15 U/L (ref 15–41)
Albumin: 4.2 g/dL (ref 3.5–5.0)
Alkaline Phosphatase: 130 U/L — ABNORMAL HIGH (ref 38–126)
Anion gap: 7 (ref 5–15)
BUN: 8 mg/dL (ref 8–23)
CO2: 31 mmol/L (ref 22–32)
Calcium: 10.3 mg/dL (ref 8.9–10.3)
Chloride: 100 mmol/L (ref 98–111)
Creatinine: 1.27 mg/dL — ABNORMAL HIGH (ref 0.61–1.24)
GFR, Est AFR Am: >60 mL/min (ref >60)
GFR, Estimated: 60 mL/min — ABNORMAL LOW (ref >60)
GLUCOSE: 235 mg/dL — AB (ref 70–99)
Potassium: 2.9 mmol/L — CL (ref 3.5–5.1)
Sodium: 138 mmol/L (ref 135–145)
Total Bilirubin: 0.3 mg/dL (ref 0.3–1.2)
Total Protein: 7.5 g/dL (ref 6.5–8.1)

## 2018-08-18 LAB — SAMPLE TO BLOOD BANK

## 2018-08-18 LAB — IRON AND TIBC
Iron: 26 ug/dL — ABNORMAL LOW (ref 42–163)
Saturation Ratios: 8 % — ABNORMAL LOW (ref 20–55)
TIBC: 338 ug/dL (ref 202–409)
UIBC: 311 ug/dL (ref 117–376)

## 2018-08-18 LAB — RETICULOCYTES
Immature Retic Fract: 24.3 % — ABNORMAL HIGH (ref 2.3–15.9)
RBC.: 3.9 MIL/uL — ABNORMAL LOW (ref 4.22–5.81)
Retic Count, Absolute: 81.5 10*3/uL (ref 19.0–186.0)
Retic Ct Pct: 2.1 % (ref 0.4–3.1)

## 2018-08-18 LAB — FERRITIN: Ferritin: 21 ng/mL — ABNORMAL LOW (ref 24–336)

## 2018-08-18 MED ORDER — SODIUM CHLORIDE 0.9 % IV SOLN
510.0000 mg | Freq: Once | INTRAVENOUS | Status: AC
  Administered 2018-08-18: 510 mg via INTRAVENOUS
  Filled 2018-08-18: qty 17

## 2018-08-18 MED ORDER — CYANOCOBALAMIN 1000 MCG/ML IJ SOLN
1000.0000 ug | Freq: Once | INTRAMUSCULAR | Status: AC
  Administered 2018-08-18: 1000 ug via INTRAMUSCULAR

## 2018-08-18 MED ORDER — CYANOCOBALAMIN 1000 MCG/ML IJ SOLN
INTRAMUSCULAR | Status: AC
  Filled 2018-08-18: qty 1

## 2018-08-18 MED ORDER — SODIUM CHLORIDE 0.9 % IV SOLN
INTRAVENOUS | Status: DC
  Administered 2018-08-18: 10:00:00 via INTRAVENOUS
  Filled 2018-08-18: qty 250

## 2018-08-18 NOTE — Progress Notes (Signed)
Hematology and Oncology Follow Up Visit  James Zuniga [id number] Oct 14, 1955 63 y.o. 08/18/2018   Principle Diagnosis:  Iron deficiency anemia Hypotestosteronemia Chronic liver lesions Pernicious anemia  Of Note: History of stroke in June 2018, no more Testosterone or ESA's  Current Therapy:   IV iron as indicated -- dose given on 08/18/2018 Vit B12 1 mg IM q month   Interim History:  [name] is here today with his sweet wife for follow-up.  He is feeling tired.  He feels short of breath.  Of course, he does not see his pulmonologist.  He is on nebulizers.  He cannot take testosterone.  He had a CVA.  He is anemic.  His hemoglobin is low at 9.5.  We will going give some iron today.  I am sure his iron is low given his MCV of only 81 and a elevated platelet count of 418,000.  We cannot give him ESA because of the CVA.  He has had no bleeding.  His blood sugars have been quite high.  I am not sure how well he manages this.  His appetite is doing okay.  He did have a decent holiday season.  He has had no change in bowel or bladder habits.  He has had some slight leg swelling.  There might be some slight neuropathy in his feet.  Currently, his performance status is ECOG 2.    Medications:  Allergies as of 08/18/2018   No Known Allergies     Medication List       Accurate as of August 18, 2018  9:04 AM. Always use your most recent med list.        albuterol (2.5 MG/3ML) 0.083% nebulizer solution Commonly known as:  PROVENTIL Take 2.5 mg by nebulization every 6 (six) hours as needed for wheezing or shortness of breath.   allopurinol 300 MG tablet Commonly known as:  ZYLOPRIM Take 300 mg by mouth daily.   amLODipine 5 MG tablet Commonly known as:  NORVASC Take 5 mg by mouth daily.   aspirin 81 MG EC tablet Take 81 mg by mouth daily. Swallow whole.   atorvastatin 80 MG tablet Commonly known as:  LIPITOR Take 1 tablet (80 mg total) by mouth every  morning.   clopidogrel 75 MG tablet Commonly known as:  PLAVIX Take 1 tablet (75 mg total) by mouth daily.   DULoxetine 60 MG capsule Commonly known as:  CYMBALTA Take 60 mg by mouth 2 (two) times daily.   furosemide 20 MG tablet Commonly known as:  LASIX Take 20 mg by mouth every Tuesday, Thursday, and Saturday at 6 PM.   gabapentin 300 MG capsule Commonly known as:  NEURONTIN Take 300 mg by mouth 4 (four) times daily.   insulin NPH-regular Human (70-30) 100 UNIT/ML injection Commonly known as:  NOVOLIN 70/30 Please inject 25 units twice daily before breakfast and dinner.   ipratropium-albuterol 0.5-2.5 (3) MG/3ML Soln Commonly known as:  DUONEB Take 3 mLs by nebulization every 6 (six) hours as needed. DX: J44.9   losartan 100 MG tablet Commonly known as:  COZAAR Take 100 mg by mouth daily.   metoprolol succinate 25 MG 24 hr tablet Commonly known as:  TOPROL-XL Take 1 tablet (25 mg total) by mouth daily.   metoprolol tartrate 25 MG tablet Commonly known as:  LOPRESSOR TAKE 2 TABLETS BY MOUTH TWICE DAILY   onetouch ultrasoft lancets USE 1 LANCET TO CHECK GLUCOSE IN THE MORNING BEFORE BREAKFAST   pantoprazole  20 MG tablet Commonly known as:  PROTONIX Take 20 mg by mouth daily.   potassium chloride SA 20 MEQ tablet Commonly known as:  K-DUR,KLOR-CON Take 1 tablet (20 mEq total) by mouth 2 (two) times daily.   PRECISION QID TEST test strip Generic drug:  glucose blood 1 each by Misc.(Non-Drug; Combo Route) route daily.   primidone 50 MG tablet Commonly known as:  MYSOLINE Take 100 mg by mouth at bedtime.   tamsulosin 0.4 MG Caps capsule Commonly known as:  FLOMAX Take 1 capsule (0.4 mg total) by mouth daily after breakfast.   tiZANidine 4 MG tablet Commonly known as:  ZANAFLEX Take 4 mg by mouth 3 (three) times daily as needed for muscle spasms.   traMADol 50 MG tablet Commonly known as:  ULTRAM Take 50 mg by mouth daily as needed for moderate pain.    VITAMIN B-12 IJ Inject 1,000 mg as directed every 30 (thirty) days. X 4   zolpidem 10 MG tablet Commonly known as:  AMBIEN Take 10 mg by mouth at bedtime.       Allergies: No Known Allergies  Past Medical History, Surgical history, Social history, and Family History were reviewed and updated.  Review of Systems: Review of Systems  Constitutional: Positive for malaise/fatigue.  HENT: Negative.   Eyes: Negative.   Respiratory: Positive for shortness of breath and wheezing.   Cardiovascular: Positive for palpitations and leg swelling.  Gastrointestinal: Positive for heartburn.  Genitourinary: Negative.   Musculoskeletal: Positive for back pain, joint pain and myalgias.  Skin: Negative.   Neurological: Negative.   Endo/Heme/Allergies: Negative.   Psychiatric/Behavioral: Negative.      Physical Exam:  weight is 233 lb (105.7 kg). His oral temperature is 98.2 F (36.8 C). His blood pressure is 128/86 and his pulse is 72. His respiration is 20 and oxygen saturation is 98%.   Wt Readings from Last 3 Encounters:  08/18/18 233 lb (105.7 kg)  07/07/18 229 lb 12 oz (104.2 kg)  05/24/18 227 lb (103 kg)    Physical Exam Vitals signs reviewed.  HENT:     Head: Normocephalic and atraumatic.  Eyes:     Pupils: Pupils are equal, round, and reactive to light.  Neck:     Musculoskeletal: Normal range of motion.  Cardiovascular:     Rate and Rhythm: Normal rate and regular rhythm.     Heart sounds: Normal heart sounds.  Pulmonary:     Effort: Pulmonary effort is normal.     Breath sounds: Normal breath sounds.  Abdominal:     General: Bowel sounds are normal.     Palpations: Abdomen is soft.  Musculoskeletal: Normal range of motion.        General: No tenderness or deformity.  Lymphadenopathy:     Cervical: No cervical adenopathy.  Skin:    General: Skin is warm and dry.     Findings: No erythema or rash.  Neurological:     Mental Status: He is alert and oriented to  person, place, and time.  Psychiatric:        Behavior: Behavior normal.        Thought Content: Thought content normal.        Judgment: Judgment normal.      Lab Results  Component Value Date   WBC 5.9 08/18/2018   HGB 9.5 (L) 08/18/2018   HCT 31.8 (L) 08/18/2018   MCV 81.5 08/18/2018   PLT 418 (H) 08/18/2018   Lab Results  Component  Value Date   FERRITIN 77 07/07/2018   IRON 38 (L) 07/07/2018   TIBC 298 07/07/2018   UIBC 260 07/07/2018   IRONPCTSAT 13 (L) 07/07/2018   Lab Results  Component Value Date   RETICCTPCT 2.1 08/18/2018   RBC 3.90 (L) 08/18/2018   RBC 3.90 (L) 08/18/2018   RETICCTABS 63.0 06/20/2015   No results found for: Nils Pyle [facility] Lab Results  Component Value Date   IGA 139 08/26/2017   No results found for: Odetta Pink, SPEI   Chemistry      Component Value Date/Time   NA 137 07/07/2018 0817   NA 141 09/25/2016 0809   K 3.0 (LL) 07/07/2018 0817   K 2.8 (LL) 09/25/2016 0809   CL 100 07/07/2018 0817   CL 102 07/31/2016 0815   CO2 29 07/07/2018 0817   CO2 28 09/25/2016 0809   BUN 10 07/07/2018 0817   BUN 13.1 09/25/2016 0809   CREATININE 1.33 (H) 07/07/2018 0817   CREATININE 1.2 09/25/2016 0809      Component Value Date/Time   CALCIUM 9.1 07/07/2018 0817   CALCIUM 9.5 09/25/2016 0809   ALKPHOS 144 (H) 07/07/2018 0817   ALKPHOS 126 09/25/2016 0809   AST 13 (L) 07/07/2018 0817   AST 19 09/25/2016 0809   ALT 18 07/07/2018 0817   ALT 23 09/25/2016 0809   BILITOT 0.3 07/07/2018 0817   BILITOT 0.45 09/25/2016 0809       Impression and Plan: Mr. 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  He also has pernicious anemia.  We will go ahead and give him a dose of iron today.  I will also give him some vitamin B12.  Hopefully, he will get his blood sugars under better control.  I  think this will clearly help his anemia and also will help make him feel a little bit better.  Hopefully, he will go see his pulmonologist.  I will plan to see him back in another month.  I think we have to see him relatively often because of his overall health situation.  Volanda Napoleon, MD 2/6/20209:04 AM

## 2018-08-18 NOTE — Progress Notes (Signed)
Dr. Marin Olp notified of potassium of 2.9.  Pt.'s wife states that Dr. Eldridge Abrahams increased pt.'s Potassium dose to 20 meq TID and QID on days of Lasix on 08/16/18.  Dr. Marin Olp aware and no changes made per Dr. Marin Olp.

## 2018-08-18 NOTE — Patient Instructions (Signed)

## 2018-08-22 DIAGNOSIS — E876 Hypokalemia: Secondary | ICD-10-CM | POA: Diagnosis not present

## 2018-08-22 DIAGNOSIS — J449 Chronic obstructive pulmonary disease, unspecified: Secondary | ICD-10-CM | POA: Diagnosis not present

## 2018-08-25 DIAGNOSIS — I517 Cardiomegaly: Secondary | ICD-10-CM | POA: Diagnosis not present

## 2018-09-13 ENCOUNTER — Telehealth: Payer: Self-pay | Admitting: Hematology & Oncology

## 2018-09-13 NOTE — Telephone Encounter (Signed)
Called needing to reschedule spouses appointments from Friday to 3/4/ appts moved as requested

## 2018-09-14 ENCOUNTER — Encounter: Payer: Self-pay | Admitting: Family

## 2018-09-14 ENCOUNTER — Inpatient Hospital Stay: Payer: PPO | Attending: Family

## 2018-09-14 ENCOUNTER — Inpatient Hospital Stay (HOSPITAL_BASED_OUTPATIENT_CLINIC_OR_DEPARTMENT_OTHER): Payer: PPO | Admitting: Family

## 2018-09-14 ENCOUNTER — Inpatient Hospital Stay: Payer: PPO

## 2018-09-14 VITALS — BP 121/76 | HR 76 | Temp 98.1°F | Resp 19 | Ht 66.0 in | Wt 234.0 lb

## 2018-09-14 VITALS — BP 112/76 | HR 75 | Temp 98.3°F | Resp 20

## 2018-09-14 DIAGNOSIS — D509 Iron deficiency anemia, unspecified: Secondary | ICD-10-CM | POA: Insufficient documentation

## 2018-09-14 DIAGNOSIS — D631 Anemia in chronic kidney disease: Secondary | ICD-10-CM | POA: Diagnosis not present

## 2018-09-14 DIAGNOSIS — D510 Vitamin B12 deficiency anemia due to intrinsic factor deficiency: Secondary | ICD-10-CM

## 2018-09-14 DIAGNOSIS — N189 Chronic kidney disease, unspecified: Secondary | ICD-10-CM | POA: Diagnosis not present

## 2018-09-14 DIAGNOSIS — K769 Liver disease, unspecified: Secondary | ICD-10-CM | POA: Diagnosis not present

## 2018-09-14 LAB — CMP (CANCER CENTER ONLY)
ALBUMIN: 4.1 g/dL (ref 3.5–5.0)
ALT: 23 U/L (ref 0–44)
AST: 18 U/L (ref 15–41)
Alkaline Phosphatase: 160 U/L — ABNORMAL HIGH (ref 38–126)
Anion gap: 8 (ref 5–15)
BUN: 13 mg/dL (ref 8–23)
CHLORIDE: 100 mmol/L (ref 98–111)
CO2: 27 mmol/L (ref 22–32)
Calcium: 9.5 mg/dL (ref 8.9–10.3)
Creatinine: 1.44 mg/dL — ABNORMAL HIGH (ref 0.61–1.24)
GFR, Est AFR Am: 59 mL/min — ABNORMAL LOW (ref >60)
GFR, Estimated: 51 mL/min — ABNORMAL LOW (ref >60)
Glucose, Bld: 355 mg/dL — ABNORMAL HIGH (ref 70–99)
Potassium: 3.2 mmol/L — ABNORMAL LOW (ref 3.5–5.1)
Sodium: 135 mmol/L (ref 135–145)
Total Bilirubin: 0.3 mg/dL (ref 0.3–1.2)
Total Protein: 7.8 g/dL (ref 6.5–8.1)

## 2018-09-14 LAB — RETICULOCYTES
Immature Retic Fract: 25.6 % — ABNORMAL HIGH (ref 2.3–15.9)
RBC.: 3.84 MIL/uL — ABNORMAL LOW (ref 4.22–5.81)
Retic Count, Absolute: 79.1 10*3/uL (ref 19.0–186.0)
Retic Ct Pct: 2.1 % (ref 0.4–3.1)

## 2018-09-14 LAB — CBC WITH DIFFERENTIAL (CANCER CENTER ONLY)
Abs Immature Granulocytes: 0.02 10*3/uL (ref 0.00–0.07)
Basophils Absolute: 0 10*3/uL (ref 0.0–0.1)
Basophils Relative: 1 %
Eosinophils Absolute: 0.2 10*3/uL (ref 0.0–0.5)
Eosinophils Relative: 4 %
HCT: 31.7 % — ABNORMAL LOW (ref 39.0–52.0)
Hemoglobin: 9.4 g/dL — ABNORMAL LOW (ref 13.0–17.0)
Immature Granulocytes: 0 %
Lymphocytes Relative: 22 %
Lymphs Abs: 1.4 10*3/uL (ref 0.7–4.0)
MCH: 24.5 pg — ABNORMAL LOW (ref 26.0–34.0)
MCHC: 29.7 g/dL — ABNORMAL LOW (ref 30.0–36.0)
MCV: 82.6 fL (ref 80.0–100.0)
Monocytes Absolute: 0.4 10*3/uL (ref 0.1–1.0)
Monocytes Relative: 6 %
NRBC: 0 % (ref 0.0–0.2)
Neutro Abs: 4.3 10*3/uL (ref 1.7–7.7)
Neutrophils Relative %: 67 %
PLATELETS: 362 10*3/uL (ref 150–400)
RBC: 3.84 MIL/uL — ABNORMAL LOW (ref 4.22–5.81)
RDW: 17.6 % — ABNORMAL HIGH (ref 11.5–15.5)
WBC Count: 6.3 10*3/uL (ref 4.0–10.5)

## 2018-09-14 LAB — VITAMIN B12: Vitamin B-12: 467 pg/mL (ref 180–914)

## 2018-09-14 MED ORDER — SODIUM CHLORIDE 0.9 % IV SOLN
INTRAVENOUS | Status: DC
  Administered 2018-09-14: 13:00:00 via INTRAVENOUS
  Filled 2018-09-14: qty 250

## 2018-09-14 MED ORDER — CYANOCOBALAMIN 1000 MCG/ML IJ SOLN
1000.0000 ug | Freq: Once | INTRAMUSCULAR | Status: AC
  Administered 2018-09-14: 1000 ug via INTRAMUSCULAR

## 2018-09-14 MED ORDER — SODIUM CHLORIDE 0.9 % IV SOLN
510.0000 mg | Freq: Once | INTRAVENOUS | Status: AC
  Administered 2018-09-14: 510 mg via INTRAVENOUS
  Filled 2018-09-14: qty 17

## 2018-09-14 MED ORDER — CYANOCOBALAMIN 1000 MCG/ML IJ SOLN
INTRAMUSCULAR | Status: AC
  Filled 2018-09-14: qty 1

## 2018-09-14 NOTE — Patient Instructions (Signed)
Cyanocobalamin, Vitamin B12 injection What is this medicine? CYANOCOBALAMIN (sye an oh koe BAL a min) is a man made form of vitamin B12. Vitamin B12 is used in the growth of healthy blood cells, nerve cells, and proteins in the body. It also helps with the metabolism of fats and carbohydrates. This medicine is used to treat people who can not absorb vitamin B12. This medicine may be used for other purposes; ask your health care provider or pharmacist if you have questions. COMMON BRAND NAME(S): B-12 Compliance Kit, B-12 Injection Kit, Cyomin, LA-12, Nutri-Twelve, Physicians EZ Use B-12, Primabalt What should I tell my health care provider before I take this medicine? They need to know if you have any of these conditions: -kidney disease -Leber's disease -megaloblastic anemia -an unusual or allergic reaction to cyanocobalamin, cobalt, other medicines, foods, dyes, or preservatives -pregnant or trying to get pregnant -breast-feeding How should I use this medicine? This medicine is injected into a muscle or deeply under the skin. It is usually given by a health care professional in a clinic or doctor's office. However, your doctor may teach you how to inject yourself. Follow all instructions. Talk to your pediatrician regarding the use of this medicine in children. Special care may be needed. Overdosage: If you think you have taken too much of this medicine contact a poison control center or emergency room at once. NOTE: This medicine is only for you. Do not share this medicine with others. What if I miss a dose? If you are given your dose at a clinic or doctor's office, call to reschedule your appointment. If you give your own injections and you miss a dose, take it as soon as you can. If it is almost time for your next dose, take only that dose. Do not take double or extra doses. What may interact with this medicine? -colchicine -heavy alcohol intake This list may not describe all possible  interactions. Give your health care provider a list of all the medicines, herbs, non-prescription drugs, or dietary supplements you use. Also tell them if you smoke, drink alcohol, or use illegal drugs. Some items may interact with your medicine. What should I watch for while using this medicine? Visit your doctor or health care professional regularly. You may need blood work done while you are taking this medicine. You may need to follow a special diet. Talk to your doctor. Limit your alcohol intake and avoid smoking to get the best benefit. What side effects may I notice from receiving this medicine? Side effects that you should report to your doctor or health care professional as soon as possible: -allergic reactions like skin rash, itching or hives, swelling of the face, lips, or tongue -blue tint to skin -chest tightness, pain -difficulty breathing, wheezing -dizziness -red, swollen painful area on the leg Side effects that usually do not require medical attention (report to your doctor or health care professional if they continue or are bothersome): -diarrhea -headache This list may not describe all possible side effects. Call your doctor for medical advice about side effects. You may report side effects to FDA at 1-800-FDA-1088. Where should I keep my medicine? Keep out of the reach of children. Store at room temperature between 15 and 30 degrees C (59 and 85 degrees F). Protect from light. Throw away any unused medicine after the expiration date. NOTE: This sheet is a summary. It may not cover all possible information. If you have questions about this medicine, talk to your doctor, pharmacist, or   health care provider.  2019 Elsevier/Gold Standard (2007-10-10 22:10:20)  

## 2018-09-14 NOTE — Progress Notes (Signed)
Hematology and Oncology Follow Up Visit  STEFFAN CANIGLIA [id number] [birth year] 63 y.o. 09/14/2018   Principle Diagnosis:  Iron deficiency anemia Hypotestosteronemia Chronic liver lesions Pernicious anemia  Of Note: History of stroke in June 2018, no more Testosterone or ESA's  Current Therapy:   IV iron as indicated  Vit B12 1 mg IM q month   Interim History:  Mr. Brisby is here today with his wife for follow-up and IV iron. He is symptomatic with fatigue, weakness, chills and occasional palpitations.  His most recent iron saturation 8% and ferritin 21.  He has not noted any episodes of bleeding, no bruising or petechiae.  He is hoping to re-establish care with his orthopedist Dr. Lorin Mercy and start receiving steroid injections in his left knee again.  No fever, chills, n/v, cough, rash, dizziness, chest pain, abdominal pain or changes in bowel or bladder habits.  He has some chronic numbness, tingling and puffiness in the right upper and lower extremities that waxes and wanes since his stroke. This is unchanged.  He ambulates with his cane for support. No falls or syncopal episodes to report.  No lymphadenopathy noted on exam.  He is eating well and staying hydrated. His weight is stable.   ECOG Performance Status: 1 - Symptomatic but completely ambulatory  Medications:  Allergies as of 09/14/2018   No Known Allergies     Medication List       Accurate as of September 14, 2018 12:48 PM. Always use your most recent med list.        albuterol (2.5 MG/3ML) 0.083% nebulizer solution Commonly known as:  PROVENTIL Take 2.5 mg by nebulization every 6 (six) hours as needed for wheezing or shortness of breath.   allopurinol 300 MG tablet Commonly known as:  ZYLOPRIM Take 300 mg by mouth daily.   amLODipine 5 MG tablet Commonly known as:  NORVASC Take 5 mg by mouth daily.   aspirin 81 MG EC tablet Take 81 mg by mouth daily. Swallow whole.   atorvastatin 80 MG tablet Commonly known  as:  LIPITOR Take 1 tablet (80 mg total) by mouth every morning.   clopidogrel 75 MG tablet Commonly known as:  PLAVIX Take 1 tablet (75 mg total) by mouth daily.   DULoxetine 60 MG capsule Commonly known as:  CYMBALTA Take 60 mg by mouth 2 (two) times daily.   furosemide 20 MG tablet Commonly known as:  LASIX Take 20 mg by mouth every Tuesday, Thursday, and Saturday at 6 PM.   gabapentin 300 MG capsule Commonly known as:  NEURONTIN Take 300 mg by mouth 4 (four) times daily.   insulin NPH-regular Human (70-30) 100 UNIT/ML injection Commonly known as:  NOVOLIN 70/30 Please inject 25 units twice daily before breakfast and dinner.   ipratropium-albuterol 0.5-2.5 (3) MG/3ML Soln Commonly known as:  DUONEB Take 3 mLs by nebulization every 6 (six) hours as needed. DX: J44.9   losartan 100 MG tablet Commonly known as:  COZAAR Take 100 mg by mouth daily.   metoprolol succinate 25 MG 24 hr tablet Commonly known as:  TOPROL-XL Take 1 tablet (25 mg total) by mouth daily.   metoprolol tartrate 25 MG tablet Commonly known as:  LOPRESSOR TAKE 2 TABLETS BY MOUTH TWICE DAILY   onetouch ultrasoft lancets USE 1 LANCET TO CHECK GLUCOSE IN THE MORNING BEFORE BREAKFAST   pantoprazole 20 MG tablet Commonly known as:  PROTONIX Take 20 mg by mouth daily.   potassium chloride SA 20 MEQ  tablet Commonly known as:  K-DUR,KLOR-CON Take 1 tablet (20 mEq total) by mouth 2 (two) times daily.   PRECISION QID TEST test strip Generic drug:  glucose blood 1 each by Misc.(Non-Drug; Combo Route) route daily.   primidone 50 MG tablet Commonly known as:  MYSOLINE Take 100 mg by mouth at bedtime.   tamsulosin 0.4 MG Caps capsule Commonly known as:  FLOMAX Take 1 capsule (0.4 mg total) by mouth daily after breakfast.   tiZANidine 4 MG tablet Commonly known as:  ZANAFLEX Take 4 mg by mouth 3 (three) times daily as needed for muscle spasms.   traMADol 50 MG tablet Commonly known as:   ULTRAM Take 50 mg by mouth daily as needed for moderate pain.   VITAMIN B-12 IJ Inject 1,000 mg as directed every 30 (thirty) days. X 4   zolpidem 10 MG tablet Commonly known as:  AMBIEN Take 10 mg by mouth at bedtime.       Allergies: No Known Allergies  Past Medical History, Surgical history, Social history, and Family History were reviewed and updated.  Review of Systems: All other 10 point review of systems is negative.   Physical Exam:  height is 5\' 6"  (1.676 m) and weight is 234 lb (106.1 kg). His oral temperature is 98.1 F (36.7 C). His blood pressure is 121/76 and his pulse is 76. His respiration is 19 and oxygen saturation is 99%.   Wt Readings from Last 3 Encounters:  09/14/18 234 lb (106.1 kg)  08/18/18 233 lb (105.7 kg)  07/07/18 229 lb 12 oz (104.2 kg)    Ocular: Sclerae unicteric, pupils equal, round and reactive to light Ear-nose-throat: Oropharynx clear, dentition fair Lymphatic: No cervical, supraclavicular or axillary adenopathy Lungs no rales or rhonchi, good excursion bilaterally Heart regular rate and rhythm, no murmur appreciated Abd soft, nontender, positive bowel sounds, no liver or spleen tip palpated on exam, no fluid wave  MSK no focal spinal tenderness, no joint edema Neuro: non-focal, well-oriented, appropriate affect Breasts: Deferred   Lab Results  Component Value Date   WBC 6.3 09/14/2018   HGB 9.4 (L) 09/14/2018   HCT 31.7 (L) 09/14/2018   MCV 82.6 09/14/2018   PLT 362 09/14/2018   Lab Results  Component Value Date   FERRITIN 21 (L) 08/18/2018   IRON 26 (L) 08/18/2018   TIBC 338 08/18/2018   UIBC 311 08/18/2018   IRONPCTSAT 8 (L) 08/18/2018   Lab Results  Component Value Date   RETICCTPCT 2.1 09/14/2018   RBC 3.84 (L) 09/14/2018   RBC 3.84 (L) 09/14/2018   RETICCTABS 63.0 06/20/2015   No results found for: KPAFRELGTCHN, LAMBDASER, [facility] Lab Results  Component Value Date   IGA 139 08/26/2017   No results found  for: Odetta Pink, SPEI   Chemistry      Component Value Date/Time   NA 135 09/14/2018 1123   NA 141 09/25/2016 0809   K 3.2 (L) 09/14/2018 1123   K 2.8 (LL) 09/25/2016 0809   CL 100 09/14/2018 1123   CL 102 07/31/2016 0815   CO2 27 09/14/2018 1123   CO2 28 09/25/2016 0809   BUN 13 09/14/2018 1123   BUN 13.1 09/25/2016 0809   CREATININE 1.44 (H) 09/14/2018 1123   CREATININE 1.2 09/25/2016 0809      Component Value Date/Time   CALCIUM 9.5 09/14/2018 1123   CALCIUM 9.5 09/25/2016 0809   ALKPHOS 160 (H) 09/14/2018 1123   ALKPHOS 126  09/25/2016 0809   AST 18 09/14/2018 1123   AST 19 09/25/2016 0809   ALT 23 09/14/2018 1123   ALT 23 09/25/2016 0809   BILITOT 0.3 09/14/2018 1123   BILITOT 0.45 09/25/2016 0809       Impression and Plan: Mr. Strahan is a very pleasant 63 yo African American gentleman with multifactorial anemia including pernicious, iron deficiency and erythropoietin deficiency anemia.  We will proceed with IV iron infusion today as planned.  He will also receive his B 12 injection.  We will plan to see him back in another month for follow-up.  They will contact our office with any questions or concerns. We can certainly see him sooner if need be.   Laverna Peace, NP 3/4/202012:48 PM

## 2018-09-14 NOTE — Patient Instructions (Signed)
Ferumoxytol injection What is this medicine? FERUMOXYTOL is an iron complex. Iron is used to make healthy red blood cells, which carry oxygen and nutrients throughout the body. This medicine is used to treat iron deficiency anemia. This medicine may be used for other purposes; ask your health care provider or pharmacist if you have questions. COMMON BRAND NAME(S): Feraheme What should I tell my health care provider before I take this medicine? They need to know if you have any of these conditions: -anemia not caused by low iron levels -high levels of iron in the blood -magnetic resonance imaging (MRI) test scheduled -an unusual or allergic reaction to iron, other medicines, foods, dyes, or preservatives -pregnant or trying to get pregnant -breast-feeding How should I use this medicine? This medicine is for injection into a vein. It is given by a health care professional in a hospital or clinic setting. Talk to your pediatrician regarding the use of this medicine in children. Special care may be needed. Overdosage: If you think you have taken too much of this medicine contact a poison control center or emergency room at once. NOTE: This medicine is only for you. Do not share this medicine with others. What if I miss a dose? It is important not to miss your dose. Call your doctor or health care professional if you are unable to keep an appointment. What may interact with this medicine? This medicine may interact with the following medications: -other iron products This list may not describe all possible interactions. Give your health care provider a list of all the medicines, herbs, non-prescription drugs, or dietary supplements you use. Also tell them if you smoke, drink alcohol, or use illegal drugs. Some items may interact with your medicine. What should I watch for while using this medicine? Visit your doctor or healthcare professional regularly. Tell your doctor or healthcare professional  if your symptoms do not start to get better or if they get worse. You may need blood work done while you are taking this medicine. You may need to follow a special diet. Talk to your doctor. Foods that contain iron include: whole grains/cereals, dried fruits, beans, or peas, leafy green vegetables, and organ meats (liver, kidney). What side effects may I notice from receiving this medicine? Side effects that you should report to your doctor or health care professional as soon as possible: -allergic reactions like skin rash, itching or hives, swelling of the face, lips, or tongue -breathing problems -changes in blood pressure -feeling faint or lightheaded, falls -fever or chills -flushing, sweating, or hot feelings -swelling of the ankles or feet Side effects that usually do not require medical attention (report to your doctor or health care professional if they continue or are bothersome): -diarrhea -headache -nausea, vomiting -stomach pain This list may not describe all possible side effects. Call your doctor for medical advice about side effects. You may report side effects to FDA at 1-800-FDA-1088. Where should I keep my medicine? This drug is given in a hospital or clinic and will not be stored at home. NOTE: This sheet is a summary. It may not cover all possible information. If you have questions about this medicine, talk to your doctor, pharmacist, or health care provider.  2019 Elsevier/Gold Standard (2016-08-17 20:21:10) Cyanocobalamin, Vitamin B12 injection What is this medicine? CYANOCOBALAMIN (sye an oh koe BAL a min) is a man made form of vitamin B12. Vitamin B12 is used in the growth of healthy blood cells, nerve cells, and proteins in the body.  It also helps with the metabolism of fats and carbohydrates. This medicine is used to treat people who can not absorb vitamin B12. This medicine may be used for other purposes; ask your health care provider or pharmacist if you have  questions. COMMON BRAND NAME(S): B-12 Compliance Kit, B-12 Injection Kit, Cyomin, LA-12, Nutri-Twelve, Physicians EZ Use B-12, Primabalt What should I tell my health care provider before I take this medicine? They need to know if you have any of these conditions: -kidney disease -Leber's disease -megaloblastic anemia -an unusual or allergic reaction to cyanocobalamin, cobalt, other medicines, foods, dyes, or preservatives -pregnant or trying to get pregnant -breast-feeding How should I use this medicine? This medicine is injected into a muscle or deeply under the skin. It is usually given by a health care professional in a clinic or doctor's office. However, your doctor may teach you how to inject yourself. Follow all instructions. Talk to your pediatrician regarding the use of this medicine in children. Special care may be needed. Overdosage: If you think you have taken too much of this medicine contact a poison control center or emergency room at once. NOTE: This medicine is only for you. Do not share this medicine with others. What if I miss a dose? If you are given your dose at a clinic or doctor's office, call to reschedule your appointment. If you give your own injections and you miss a dose, take it as soon as you can. If it is almost time for your next dose, take only that dose. Do not take double or extra doses. What may interact with this medicine? -colchicine -heavy alcohol intake This list may not describe all possible interactions. Give your health care provider a list of all the medicines, herbs, non-prescription drugs, or dietary supplements you use. Also tell them if you smoke, drink alcohol, or use illegal drugs. Some items may interact with your medicine. What should I watch for while using this medicine? Visit your doctor or health care professional regularly. You may need blood work done while you are taking this medicine. You may need to follow a special diet. Talk to your  doctor. Limit your alcohol intake and avoid smoking to get the best benefit. What side effects may I notice from receiving this medicine? Side effects that you should report to your doctor or health care professional as soon as possible: -allergic reactions like skin rash, itching or hives, swelling of the face, lips, or tongue -blue tint to skin -chest tightness, pain -difficulty breathing, wheezing -dizziness -red, swollen painful area on the leg Side effects that usually do not require medical attention (report to your doctor or health care professional if they continue or are bothersome): -diarrhea -headache This list may not describe all possible side effects. Call your doctor for medical advice about side effects. You may report side effects to FDA at 1-800-FDA-1088. Where should I keep my medicine? Keep out of the reach of children. Store at room temperature between 15 and 30 degrees C (59 and 85 degrees F). Protect from light. Throw away any unused medicine after the expiration date. NOTE: This sheet is a summary. It may not cover all possible information. If you have questions about this medicine, talk to your doctor, pharmacist, or health care provider.  2019 Elsevier/Gold Standard (2007-10-10 22:10:20)

## 2018-09-14 NOTE — Progress Notes (Signed)
error 

## 2018-09-15 ENCOUNTER — Telehealth: Payer: Self-pay | Admitting: *Deleted

## 2018-09-15 LAB — IRON AND TIBC
Iron: 32 ug/dL — ABNORMAL LOW (ref 42–163)
Saturation Ratios: 11 % — ABNORMAL LOW (ref 20–55)
TIBC: 296 ug/dL (ref 202–409)
UIBC: 264 ug/dL (ref 117–376)

## 2018-09-15 LAB — FERRITIN: Ferritin: 45 ng/mL (ref 24–336)

## 2018-09-15 NOTE — Telephone Encounter (Signed)
-----   Message from Volanda Napoleon, MD sent at 09/15/2018  1:47 PM EST ----- Call - iron is low.  He needs 1 dose of Feraheme. Please set up.  Thanks~!!  [employer]

## 2018-09-15 NOTE — Telephone Encounter (Signed)
Reviewed with MD, pt does not need IV Iron infusion- pt rcvd IV Iron 3/4 and B-12.

## 2018-09-16 ENCOUNTER — Inpatient Hospital Stay: Payer: PPO

## 2018-09-16 ENCOUNTER — Ambulatory Visit: Payer: Self-pay | Admitting: Hematology & Oncology

## 2018-09-16 ENCOUNTER — Ambulatory Visit: Payer: Self-pay

## 2018-09-16 DIAGNOSIS — J449 Chronic obstructive pulmonary disease, unspecified: Secondary | ICD-10-CM | POA: Diagnosis not present

## 2018-09-26 DIAGNOSIS — G4733 Obstructive sleep apnea (adult) (pediatric): Secondary | ICD-10-CM | POA: Diagnosis not present

## 2018-09-26 DIAGNOSIS — J449 Chronic obstructive pulmonary disease, unspecified: Secondary | ICD-10-CM | POA: Diagnosis not present

## 2018-10-05 DIAGNOSIS — I639 Cerebral infarction, unspecified: Secondary | ICD-10-CM | POA: Diagnosis not present

## 2018-10-11 DIAGNOSIS — J449 Chronic obstructive pulmonary disease, unspecified: Secondary | ICD-10-CM | POA: Diagnosis not present

## 2018-10-12 ENCOUNTER — Inpatient Hospital Stay: Payer: PPO

## 2018-10-12 ENCOUNTER — Other Ambulatory Visit: Payer: Self-pay

## 2018-10-12 ENCOUNTER — Inpatient Hospital Stay: Payer: PPO | Attending: Family | Admitting: Family

## 2018-10-12 ENCOUNTER — Encounter: Payer: Self-pay | Admitting: Family

## 2018-10-12 VITALS — BP 135/88 | HR 74 | Temp 98.3°F | Resp 20 | Wt 231.1 lb

## 2018-10-12 DIAGNOSIS — D631 Anemia in chronic kidney disease: Secondary | ICD-10-CM

## 2018-10-12 DIAGNOSIS — E119 Type 2 diabetes mellitus without complications: Secondary | ICD-10-CM

## 2018-10-12 DIAGNOSIS — D510 Vitamin B12 deficiency anemia due to intrinsic factor deficiency: Secondary | ICD-10-CM | POA: Diagnosis not present

## 2018-10-12 DIAGNOSIS — D509 Iron deficiency anemia, unspecified: Secondary | ICD-10-CM | POA: Diagnosis not present

## 2018-10-12 DIAGNOSIS — Z8673 Personal history of transient ischemic attack (TIA), and cerebral infarction without residual deficits: Secondary | ICD-10-CM | POA: Diagnosis not present

## 2018-10-12 DIAGNOSIS — Z79899 Other long term (current) drug therapy: Secondary | ICD-10-CM

## 2018-10-12 DIAGNOSIS — N189 Chronic kidney disease, unspecified: Secondary | ICD-10-CM | POA: Diagnosis not present

## 2018-10-12 LAB — CBC WITH DIFFERENTIAL (CANCER CENTER ONLY)
Abs Immature Granulocytes: 0.02 10*3/uL (ref 0.00–0.07)
Basophils Absolute: 0 10*3/uL (ref 0.0–0.1)
Basophils Relative: 1 %
Eosinophils Absolute: 0.2 10*3/uL (ref 0.0–0.5)
Eosinophils Relative: 4 %
HCT: 33.9 % — ABNORMAL LOW (ref 39.0–52.0)
Hemoglobin: 10.1 g/dL — ABNORMAL LOW (ref 13.0–17.0)
Immature Granulocytes: 0 %
Lymphocytes Relative: 23 %
Lymphs Abs: 1.3 10*3/uL (ref 0.7–4.0)
MCH: 24.8 pg — ABNORMAL LOW (ref 26.0–34.0)
MCHC: 29.8 g/dL — ABNORMAL LOW (ref 30.0–36.0)
MCV: 83.1 fL (ref 80.0–100.0)
Monocytes Absolute: 0.3 10*3/uL (ref 0.1–1.0)
Monocytes Relative: 5 %
Neutro Abs: 3.7 10*3/uL (ref 1.7–7.7)
Neutrophils Relative %: 67 %
Platelet Count: 333 10*3/uL (ref 150–400)
RBC: 4.08 MIL/uL — ABNORMAL LOW (ref 4.22–5.81)
RDW: 17.6 % — ABNORMAL HIGH (ref 11.5–15.5)
WBC Count: 5.5 10*3/uL (ref 4.0–10.5)
nRBC: 0 % (ref 0.0–0.2)

## 2018-10-12 LAB — CMP (CANCER CENTER ONLY)
ALT: 14 U/L (ref 0–44)
AST: 13 U/L — ABNORMAL LOW (ref 15–41)
Albumin: 4 g/dL (ref 3.5–5.0)
Alkaline Phosphatase: 135 U/L — ABNORMAL HIGH (ref 38–126)
Anion gap: 8 (ref 5–15)
BUN: 7 mg/dL — ABNORMAL LOW (ref 8–23)
CO2: 29 mmol/L (ref 22–32)
Calcium: 9.4 mg/dL (ref 8.9–10.3)
Chloride: 103 mmol/L (ref 98–111)
Creatinine: 1.12 mg/dL (ref 0.61–1.24)
GFR, Est AFR Am: >60 mL/min (ref >60)
GFR, Estimated: >60 mL/min (ref >60)
Glucose, Bld: 203 mg/dL — ABNORMAL HIGH (ref 70–99)
Potassium: 3.1 mmol/L — ABNORMAL LOW (ref 3.5–5.1)
Sodium: 140 mmol/L (ref 135–145)
Total Bilirubin: 0.3 mg/dL (ref 0.3–1.2)
Total Protein: 7.7 g/dL (ref 6.5–8.1)

## 2018-10-12 LAB — RETICULOCYTES
Immature Retic Fract: 26.2 % — ABNORMAL HIGH (ref 2.3–15.9)
RBC.: 4.03 MIL/uL — ABNORMAL LOW (ref 4.22–5.81)
Retic Count, Absolute: 92.7 10*3/uL (ref 19.0–186.0)
Retic Ct Pct: 2.3 % (ref 0.4–3.1)

## 2018-10-12 LAB — VITAMIN B12: Vitamin B-12: 512 pg/mL (ref 180–914)

## 2018-10-12 MED ORDER — CYANOCOBALAMIN 1000 MCG/ML IJ SOLN
1000.0000 ug | Freq: Once | INTRAMUSCULAR | Status: AC
  Administered 2018-10-12: 1000 ug via INTRAMUSCULAR

## 2018-10-12 MED ORDER — CYANOCOBALAMIN 1000 MCG/ML IJ SOLN
INTRAMUSCULAR | Status: AC
  Filled 2018-10-12: qty 1

## 2018-10-12 NOTE — Progress Notes (Signed)
Hematology and Oncology Follow Up Visit  James Zuniga [id number] [birth year] 63 y.o. 10/12/2018   Principle Diagnosis:  Iron deficiency anemia Hypotestosteronemia Chronic liver lesions Pernicious anemia  Of Note: History of stroke in June 2018, no more Testosterone or ESA's  Current Therapy:   IV iron as indicated Vit B12 1 mg IM q month   Interim History:  James Zuniga is here today for follow-up. He is still having some fatigue and occasional palpitations. He craves cold drinks and ice as well.  He is diabetic and state that this keeps him thirsty.  No fever, chills, n/v, cough, rash, dizziness, SOB, chest pain,  abdominal pain or changes in bowel or bladder habits.  The neuropathy in his hands and feet is unchanged. His right sided residual weakness is unchanged.  He is using his cane when ambulating. No falls or syncopal episodes to report.  No lymphadenopathy noted on exam.  No episodes of bleeding, no bruising or petechia.  He is eating well and staying hydrated. His weight is stable.   ECOG Performance Status: 1 - Symptomatic but completely ambulatory  Medications:  Allergies as of 10/12/2018   No Known Allergies     Medication List       Accurate as of October 12, 2018 11:07 AM. Always use your most recent med list.        albuterol (2.5 MG/3ML) 0.083% nebulizer solution Commonly known as:  PROVENTIL Take 2.5 mg by nebulization every 6 (six) hours as needed for wheezing or shortness of breath.   allopurinol 300 MG tablet Commonly known as:  ZYLOPRIM Take 300 mg by mouth daily.   amLODipine 5 MG tablet Commonly known as:  NORVASC Take 5 mg by mouth daily.   aspirin 81 MG EC tablet Take 81 mg by mouth daily. Swallow whole.   atorvastatin 80 MG tablet Commonly known as:  LIPITOR Take 1 tablet (80 mg total) by mouth every morning.   clopidogrel 75 MG tablet Commonly known as:  PLAVIX Take 1 tablet (75 mg total) by mouth daily.   DULoxetine 60 MG capsule  Commonly known as:  CYMBALTA Take 60 mg by mouth 2 (two) times daily.   furosemide 20 MG tablet Commonly known as:  LASIX Take 20 mg by mouth every Tuesday, Thursday, and Saturday at 6 PM.   gabapentin 300 MG capsule Commonly known as:  NEURONTIN Take 300 mg by mouth 4 (four) times daily.   insulin NPH-regular Human (70-30) 100 UNIT/ML injection Commonly known as:  NovoLIN 70/30 Please inject 25 units twice daily before breakfast and dinner.   ipratropium-albuterol 0.5-2.5 (3) MG/3ML Soln Commonly known as:  DUONEB Take 3 mLs by nebulization every 6 (six) hours as needed. DX: J44.9   losartan 100 MG tablet Commonly known as:  COZAAR Take 100 mg by mouth daily.   metoprolol succinate 25 MG 24 hr tablet Commonly known as:  TOPROL-XL Take 1 tablet (25 mg total) by mouth daily.   metoprolol tartrate 25 MG tablet Commonly known as:  LOPRESSOR TAKE 2 TABLETS BY MOUTH TWICE DAILY   onetouch ultrasoft lancets USE 1 LANCET TO CHECK GLUCOSE IN THE MORNING BEFORE BREAKFAST   pantoprazole 20 MG tablet Commonly known as:  PROTONIX Take 20 mg by mouth daily.   potassium chloride SA 20 MEQ tablet Commonly known as:  K-DUR,KLOR-CON Take 1 tablet (20 mEq total) by mouth 2 (two) times daily.   Precision QID Test test strip Generic drug:  glucose blood 1 each  by Misc.(Non-Drug; Combo Route) route daily.   primidone 50 MG tablet Commonly known as:  MYSOLINE Take 100 mg by mouth at bedtime.   tamsulosin 0.4 MG Caps capsule Commonly known as:  FLOMAX Take 1 capsule (0.4 mg total) by mouth daily after breakfast.   tiZANidine 4 MG tablet Commonly known as:  ZANAFLEX Take 4 mg by mouth 3 (three) times daily as needed for muscle spasms.   traMADol 50 MG tablet Commonly known as:  ULTRAM Take 50 mg by mouth daily as needed for moderate pain.   VITAMIN B-12 IJ Inject 1,000 mg as directed every 30 (thirty) days. X 4   zolpidem 10 MG tablet Commonly known as:  AMBIEN Take 10 mg  by mouth at bedtime.       Allergies: No Known Allergies  Past Medical History, Surgical history, Social history, and Family History were reviewed and updated.  Review of Systems: All other 10 point review of systems is negative.   Physical Exam:  vitals were not taken for this visit.   Wt Readings from Last 3 Encounters:  09/14/18 234 lb (106.1 kg)  08/18/18 233 lb (105.7 kg)  07/07/18 229 lb 12 oz (104.2 kg)    Ocular: Sclerae unicteric, pupils equal, round and reactive to light Ear-nose-throat: Oropharynx clear, dentition fair Lymphatic: No cervical, supraclavicular or axillary adenopathy Lungs no rales or rhonchi, good excursion bilaterally Heart regular rate and rhythm, no murmur appreciated Abd soft, nontender, positive bowel sounds, no liver or spleen tip palpated on exam, no fluid wave  MSK no focal spinal tenderness, no joint edema Neuro: non-focal, well-oriented, appropriate affect Breasts: Deferred   Lab Results  Component Value Date   WBC 5.5 10/12/2018   HGB 10.1 (L) 10/12/2018   HCT 33.9 (L) 10/12/2018   MCV 83.1 10/12/2018   PLT 333 10/12/2018   Lab Results  Component Value Date   FERRITIN 45 09/14/2018   IRON 32 (L) 09/14/2018   TIBC 296 09/14/2018   UIBC 264 09/14/2018   IRONPCTSAT 11 (L) 09/14/2018   Lab Results  Component Value Date   RETICCTPCT 2.3 10/12/2018   RBC 4.03 (L) 10/12/2018   RETICCTABS 63.0 06/20/2015   No results found for: Nils Pyle [facility] Lab Results  Component Value Date   IGA 139 08/26/2017   No results found for: Odetta Pink, SPEI   Chemistry      Component Value Date/Time   NA 135 09/14/2018 1123   NA 141 09/25/2016 0809   K 3.2 (L) 09/14/2018 1123   K 2.8 (LL) 09/25/2016 0809   CL 100 09/14/2018 1123   CL 102 07/31/2016 0815   CO2 27 09/14/2018 1123   CO2 28 09/25/2016 0809   BUN 13 09/14/2018 1123   BUN 13.1 09/25/2016 0809    CREATININE 1.44 (H) 09/14/2018 1123   CREATININE 1.2 09/25/2016 0809      Component Value Date/Time   CALCIUM 9.5 09/14/2018 1123   CALCIUM 9.5 09/25/2016 0809   ALKPHOS 160 (H) 09/14/2018 1123   ALKPHOS 126 09/25/2016 0809   AST 18 09/14/2018 1123   AST 19 09/25/2016 0809   ALT 23 09/14/2018 1123   ALT 23 09/25/2016 0809   BILITOT 0.3 09/14/2018 1123   BILITOT 0.45 09/25/2016 0809       Impression and Plan: Mr. Commons is a very pleasant 63 yo African American gentleman with multifactorial anemia including pernicious, iron deficiency and erythropoietin deficiency anemia.  He received  his B 12 injection today as planned. We will see what his iron studies show and bring him back in for infusion if needed.  He will contact our office with any questions or concerns. We can certainly see him sooner if need be.  We will plan to see him back in another month.   Laverna Peace, NP 4/1/202011:07 AM

## 2018-10-12 NOTE — Patient Instructions (Signed)
Cyanocobalamin, Vitamin B12 injection What is this medicine? CYANOCOBALAMIN (sye an oh koe BAL a min) is a man made form of vitamin B12. Vitamin B12 is used in the growth of healthy blood cells, nerve cells, and proteins in the body. It also helps with the metabolism of fats and carbohydrates. This medicine is used to treat people who can not absorb vitamin B12. This medicine may be used for other purposes; ask your health care provider or pharmacist if you have questions. COMMON BRAND NAME(S): B-12 Compliance Kit, B-12 Injection Kit, Cyomin, LA-12, Nutri-Twelve, Physicians EZ Use B-12, Primabalt What should I tell my health care provider before I take this medicine? They need to know if you have any of these conditions: -kidney disease -Leber's disease -megaloblastic anemia -an unusual or allergic reaction to cyanocobalamin, cobalt, other medicines, foods, dyes, or preservatives -pregnant or trying to get pregnant -breast-feeding How should I use this medicine? This medicine is injected into a muscle or deeply under the skin. It is usually given by a health care professional in a clinic or doctor's office. However, your doctor may teach you how to inject yourself. Follow all instructions. Talk to your pediatrician regarding the use of this medicine in children. Special care may be needed. Overdosage: If you think you have taken too much of this medicine contact a poison control center or emergency room at once. NOTE: This medicine is only for you. Do not share this medicine with others. What if I miss a dose? If you are given your dose at a clinic or doctor's office, call to reschedule your appointment. If you give your own injections and you miss a dose, take it as soon as you can. If it is almost time for your next dose, take only that dose. Do not take double or extra doses. What may interact with this medicine? -colchicine -heavy alcohol intake This list may not describe all possible  interactions. Give your health care provider a list of all the medicines, herbs, non-prescription drugs, or dietary supplements you use. Also tell them if you smoke, drink alcohol, or use illegal drugs. Some items may interact with your medicine. What should I watch for while using this medicine? Visit your doctor or health care professional regularly. You may need blood work done while you are taking this medicine. You may need to follow a special diet. Talk to your doctor. Limit your alcohol intake and avoid smoking to get the best benefit. What side effects may I notice from receiving this medicine? Side effects that you should report to your doctor or health care professional as soon as possible: -allergic reactions like skin rash, itching or hives, swelling of the face, lips, or tongue -blue tint to skin -chest tightness, pain -difficulty breathing, wheezing -dizziness -red, swollen painful area on the leg Side effects that usually do not require medical attention (report to your doctor or health care professional if they continue or are bothersome): -diarrhea -headache This list may not describe all possible side effects. Call your doctor for medical advice about side effects. You may report side effects to FDA at 1-800-FDA-1088. Where should I keep my medicine? Keep out of the reach of children. Store at room temperature between 15 and 30 degrees C (59 and 85 degrees F). Protect from light. Throw away any unused medicine after the expiration date. NOTE: This sheet is a summary. It may not cover all possible information. If you have questions about this medicine, talk to your doctor, pharmacist, or   health care provider.  2019 Elsevier/Gold Standard (2007-10-10 22:10:20)  

## 2018-10-13 LAB — IRON AND TIBC
Iron: 32 ug/dL — ABNORMAL LOW (ref 42–163)
Saturation Ratios: 11 % — ABNORMAL LOW (ref 20–55)
TIBC: 299 ug/dL (ref 202–409)
UIBC: 267 ug/dL (ref 117–376)

## 2018-10-13 LAB — FERRITIN: Ferritin: 41 ng/mL (ref 24–336)

## 2018-10-14 ENCOUNTER — Telehealth: Payer: Self-pay | Admitting: Hematology & Oncology

## 2018-10-14 NOTE — Telephone Encounter (Signed)
Called and was unable to contact patient regarding appointment- NO VM/ letter/calendar mailed per 4/2 sch msg

## 2018-10-21 ENCOUNTER — Inpatient Hospital Stay: Payer: PPO

## 2018-10-21 ENCOUNTER — Other Ambulatory Visit: Payer: Self-pay

## 2018-10-21 VITALS — BP 120/74 | HR 70 | Temp 98.4°F | Resp 18

## 2018-10-21 DIAGNOSIS — D510 Vitamin B12 deficiency anemia due to intrinsic factor deficiency: Secondary | ICD-10-CM | POA: Diagnosis not present

## 2018-10-21 DIAGNOSIS — D509 Iron deficiency anemia, unspecified: Secondary | ICD-10-CM

## 2018-10-21 MED ORDER — SODIUM CHLORIDE 0.9 % IV SOLN
510.0000 mg | Freq: Once | INTRAVENOUS | Status: AC
  Administered 2018-10-21: 510 mg via INTRAVENOUS
  Filled 2018-10-21: qty 17

## 2018-10-21 MED ORDER — SODIUM CHLORIDE 0.9 % IV SOLN
INTRAVENOUS | Status: DC
  Administered 2018-10-21: 13:00:00 via INTRAVENOUS
  Filled 2018-10-21: qty 250

## 2018-10-21 NOTE — Patient Instructions (Signed)

## 2018-10-31 DIAGNOSIS — I10 Essential (primary) hypertension: Secondary | ICD-10-CM | POA: Diagnosis not present

## 2018-10-31 DIAGNOSIS — G629 Polyneuropathy, unspecified: Secondary | ICD-10-CM | POA: Diagnosis not present

## 2018-10-31 DIAGNOSIS — N521 Erectile dysfunction due to diseases classified elsewhere: Secondary | ICD-10-CM | POA: Diagnosis not present

## 2018-10-31 DIAGNOSIS — E118 Type 2 diabetes mellitus with unspecified complications: Secondary | ICD-10-CM | POA: Diagnosis not present

## 2018-10-31 DIAGNOSIS — M1712 Unilateral primary osteoarthritis, left knee: Secondary | ICD-10-CM | POA: Diagnosis not present

## 2018-10-31 DIAGNOSIS — I739 Peripheral vascular disease, unspecified: Secondary | ICD-10-CM | POA: Diagnosis not present

## 2018-10-31 DIAGNOSIS — E1100 Type 2 diabetes mellitus with hyperosmolarity without nonketotic hyperglycemic-hyperosmolar coma (NKHHC): Secondary | ICD-10-CM | POA: Diagnosis not present

## 2018-10-31 DIAGNOSIS — J439 Emphysema, unspecified: Secondary | ICD-10-CM | POA: Diagnosis not present

## 2018-10-31 DIAGNOSIS — J449 Chronic obstructive pulmonary disease, unspecified: Secondary | ICD-10-CM | POA: Diagnosis not present

## 2018-11-04 DIAGNOSIS — J449 Chronic obstructive pulmonary disease, unspecified: Secondary | ICD-10-CM | POA: Diagnosis not present

## 2018-11-09 DIAGNOSIS — I10 Essential (primary) hypertension: Secondary | ICD-10-CM | POA: Diagnosis not present

## 2018-11-09 DIAGNOSIS — Z95818 Presence of other cardiac implants and grafts: Secondary | ICD-10-CM | POA: Diagnosis not present

## 2018-11-09 DIAGNOSIS — R5381 Other malaise: Secondary | ICD-10-CM | POA: Diagnosis not present

## 2018-11-09 DIAGNOSIS — R0602 Shortness of breath: Secondary | ICD-10-CM | POA: Diagnosis not present

## 2018-11-09 DIAGNOSIS — I428 Other cardiomyopathies: Secondary | ICD-10-CM | POA: Diagnosis not present

## 2018-11-09 DIAGNOSIS — R5383 Other fatigue: Secondary | ICD-10-CM | POA: Diagnosis not present

## 2018-11-14 ENCOUNTER — Encounter: Payer: Self-pay | Admitting: Family

## 2018-11-14 ENCOUNTER — Inpatient Hospital Stay: Payer: PPO

## 2018-11-14 ENCOUNTER — Telehealth: Payer: Self-pay | Admitting: Hematology & Oncology

## 2018-11-14 ENCOUNTER — Inpatient Hospital Stay: Payer: PPO | Attending: Family | Admitting: Family

## 2018-11-14 ENCOUNTER — Other Ambulatory Visit: Payer: Self-pay

## 2018-11-14 VITALS — BP 142/89 | HR 80

## 2018-11-14 VITALS — BP 142/90 | HR 82 | Resp 19 | Ht 66.0 in | Wt 231.8 lb

## 2018-11-14 DIAGNOSIS — D500 Iron deficiency anemia secondary to blood loss (chronic): Secondary | ICD-10-CM

## 2018-11-14 DIAGNOSIS — Z79899 Other long term (current) drug therapy: Secondary | ICD-10-CM | POA: Diagnosis not present

## 2018-11-14 DIAGNOSIS — D509 Iron deficiency anemia, unspecified: Secondary | ICD-10-CM

## 2018-11-14 DIAGNOSIS — N189 Chronic kidney disease, unspecified: Secondary | ICD-10-CM

## 2018-11-14 DIAGNOSIS — D631 Anemia in chronic kidney disease: Secondary | ICD-10-CM

## 2018-11-14 DIAGNOSIS — D510 Vitamin B12 deficiency anemia due to intrinsic factor deficiency: Secondary | ICD-10-CM

## 2018-11-14 LAB — CMP (CANCER CENTER ONLY)
ALT: 22 U/L (ref 0–44)
AST: 21 U/L (ref 15–41)
Albumin: 4 g/dL (ref 3.5–5.0)
Alkaline Phosphatase: 148 U/L — ABNORMAL HIGH (ref 38–126)
Anion gap: 9 (ref 5–15)
BUN: 7 mg/dL — ABNORMAL LOW (ref 8–23)
CO2: 27 mmol/L (ref 22–32)
Calcium: 9.8 mg/dL (ref 8.9–10.3)
Chloride: 102 mmol/L (ref 98–111)
Creatinine: 1.14 mg/dL (ref 0.61–1.24)
GFR, Est AFR Am: >60 mL/min (ref >60)
GFR, Estimated: >60 mL/min (ref >60)
Glucose, Bld: 237 mg/dL — ABNORMAL HIGH (ref 70–99)
Potassium: 3.1 mmol/L — ABNORMAL LOW (ref 3.5–5.1)
Sodium: 138 mmol/L (ref 135–145)
Total Bilirubin: 0.4 mg/dL (ref 0.3–1.2)
Total Protein: 7.7 g/dL (ref 6.5–8.1)

## 2018-11-14 LAB — CBC WITH DIFFERENTIAL (CANCER CENTER ONLY)
Abs Immature Granulocytes: 0.01 10*3/uL (ref 0.00–0.07)
Basophils Absolute: 0 10*3/uL (ref 0.0–0.1)
Basophils Relative: 1 %
Eosinophils Absolute: 0.2 10*3/uL (ref 0.0–0.5)
Eosinophils Relative: 4 %
HCT: 35.3 % — ABNORMAL LOW (ref 39.0–52.0)
Hemoglobin: 10.4 g/dL — ABNORMAL LOW (ref 13.0–17.0)
Immature Granulocytes: 0 %
Lymphocytes Relative: 23 %
Lymphs Abs: 1.2 10*3/uL (ref 0.7–4.0)
MCH: 24.6 pg — ABNORMAL LOW (ref 26.0–34.0)
MCHC: 29.5 g/dL — ABNORMAL LOW (ref 30.0–36.0)
MCV: 83.5 fL (ref 80.0–100.0)
Monocytes Absolute: 0.3 10*3/uL (ref 0.1–1.0)
Monocytes Relative: 6 %
Neutro Abs: 3.4 10*3/uL (ref 1.7–7.7)
Neutrophils Relative %: 66 %
Platelet Count: 313 10*3/uL (ref 150–400)
RBC: 4.23 MIL/uL (ref 4.22–5.81)
RDW: 18.4 % — ABNORMAL HIGH (ref 11.5–15.5)
WBC Count: 5.2 10*3/uL (ref 4.0–10.5)
nRBC: 0 % (ref 0.0–0.2)

## 2018-11-14 LAB — IRON AND TIBC
Iron: 30 ug/dL — ABNORMAL LOW (ref 42–163)
Saturation Ratios: 10 % — ABNORMAL LOW (ref 20–55)
TIBC: 303 ug/dL (ref 202–409)
UIBC: 273 ug/dL (ref 117–376)

## 2018-11-14 LAB — RETICULOCYTES
Immature Retic Fract: 29.6 % — ABNORMAL HIGH (ref 2.3–15.9)
RBC.: 4.17 MIL/uL — ABNORMAL LOW (ref 4.22–5.81)
Retic Count, Absolute: 103.4 10*3/uL (ref 19.0–186.0)
Retic Ct Pct: 2.5 % (ref 0.4–3.1)

## 2018-11-14 LAB — VITAMIN B12: Vitamin B-12: 508 pg/mL (ref 180–914)

## 2018-11-14 LAB — FERRITIN: Ferritin: 64 ng/mL (ref 24–336)

## 2018-11-14 MED ORDER — SODIUM CHLORIDE 0.9 % IV SOLN
INTRAVENOUS | Status: DC
  Administered 2018-11-14: 10:00:00 via INTRAVENOUS
  Filled 2018-11-14: qty 250

## 2018-11-14 MED ORDER — CYANOCOBALAMIN 1000 MCG/ML IJ SOLN
1000.0000 ug | Freq: Once | INTRAMUSCULAR | Status: AC
  Administered 2018-11-14: 1000 ug via INTRAMUSCULAR

## 2018-11-14 MED ORDER — CYANOCOBALAMIN 1000 MCG/ML IJ SOLN
INTRAMUSCULAR | Status: AC
  Filled 2018-11-14: qty 1

## 2018-11-14 MED ORDER — SODIUM CHLORIDE 0.9 % IV SOLN
510.0000 mg | Freq: Once | INTRAVENOUS | Status: AC
  Administered 2018-11-14: 510 mg via INTRAVENOUS
  Filled 2018-11-14: qty 17

## 2018-11-14 NOTE — Telephone Encounter (Signed)
Appointments scheduled pat to get updated schedule at next visit per 5/4 los

## 2018-11-14 NOTE — Patient Instructions (Signed)
Cyanocobalamin, Vitamin B12 injection What is this medicine? CYANOCOBALAMIN (sye an oh koe BAL a min) is a man made form of vitamin B12. Vitamin B12 is used in the growth of healthy blood cells, nerve cells, and proteins in the body. It also helps with the metabolism of fats and carbohydrates. This medicine is used to treat people who can not absorb vitamin B12. This medicine may be used for other purposes; ask your health care provider or pharmacist if you have questions. COMMON BRAND NAME(S): B-12 Compliance Kit, B-12 Injection Kit, Cyomin, LA-12, Nutri-Twelve, Physicians EZ Use B-12, Primabalt What should I tell my health care provider before I take this medicine? They need to know if you have any of these conditions: -kidney disease -Leber's disease -megaloblastic anemia -an unusual or allergic reaction to cyanocobalamin, cobalt, other medicines, foods, dyes, or preservatives -pregnant or trying to get pregnant -breast-feeding How should I use this medicine? This medicine is injected into a muscle or deeply under the skin. It is usually given by a health care professional in a clinic or doctor's office. However, your doctor may teach you how to inject yourself. Follow all instructions. Talk to your pediatrician regarding the use of this medicine in children. Special care may be needed. Overdosage: If you think you have taken too much of this medicine contact a poison control center or emergency room at once. NOTE: This medicine is only for you. Do not share this medicine with others. What if I miss a dose? If you are given your dose at a clinic or doctor's office, call to reschedule your appointment. If you give your own injections and you miss a dose, take it as soon as you can. If it is almost time for your next dose, take only that dose. Do not take double or extra doses. What may interact with this medicine? -colchicine -heavy alcohol intake This list may not describe all possible  interactions. Give your health care provider a list of all the medicines, herbs, non-prescription drugs, or dietary supplements you use. Also tell them if you smoke, drink alcohol, or use illegal drugs. Some items may interact with your medicine. What should I watch for while using this medicine? Visit your doctor or health care professional regularly. You may need blood work done while you are taking this medicine. You may need to follow a special diet. Talk to your doctor. Limit your alcohol intake and avoid smoking to get the best benefit. What side effects may I notice from receiving this medicine? Side effects that you should report to your doctor or health care professional as soon as possible: -allergic reactions like skin rash, itching or hives, swelling of the face, lips, or tongue -blue tint to skin -chest tightness, pain -difficulty breathing, wheezing -dizziness -red, swollen painful area on the leg Side effects that usually do not require medical attention (report to your doctor or health care professional if they continue or are bothersome): -diarrhea -headache This list may not describe all possible side effects. Call your doctor for medical advice about side effects. You may report side effects to FDA at 1-800-FDA-1088. Where should I keep my medicine? Keep out of the reach of children. Store at room temperature between 15 and 30 degrees C (59 and 85 degrees F). Protect from light. Throw away any unused medicine after the expiration date. NOTE: This sheet is a summary. It may not cover all possible information. If you have questions about this medicine, talk to your doctor, pharmacist, or   health care provider.  2019 Elsevier/Gold Standard (2007-10-10 22:10:20) Ferumoxytol injection What is this medicine? FERUMOXYTOL is an iron complex. Iron is used to make healthy red blood cells, which carry oxygen and nutrients throughout the body. This medicine is used to treat iron  deficiency anemia. This medicine may be used for other purposes; ask your health care provider or pharmacist if you have questions. COMMON BRAND NAME(S): Feraheme What should I tell my health care provider before I take this medicine? They need to know if you have any of these conditions: -anemia not caused by low iron levels -high levels of iron in the blood -magnetic resonance imaging (MRI) test scheduled -an unusual or allergic reaction to iron, other medicines, foods, dyes, or preservatives -pregnant or trying to get pregnant -breast-feeding How should I use this medicine? This medicine is for injection into a vein. It is given by a health care professional in a hospital or clinic setting. Talk to your pediatrician regarding the use of this medicine in children. Special care may be needed. Overdosage: If you think you have taken too much of this medicine contact a poison control center or emergency room at once. NOTE: This medicine is only for you. Do not share this medicine with others. What if I miss a dose? It is important not to miss your dose. Call your doctor or health care professional if you are unable to keep an appointment. What may interact with this medicine? This medicine may interact with the following medications: -other iron products This list may not describe all possible interactions. Give your health care provider a list of all the medicines, herbs, non-prescription drugs, or dietary supplements you use. Also tell them if you smoke, drink alcohol, or use illegal drugs. Some items may interact with your medicine. What should I watch for while using this medicine? Visit your doctor or healthcare professional regularly. Tell your doctor or healthcare professional if your symptoms do not start to get better or if they get worse. You may need blood work done while you are taking this medicine. You may need to follow a special diet. Talk to your doctor. Foods that contain  iron include: whole grains/cereals, dried fruits, beans, or peas, leafy green vegetables, and organ meats (liver, kidney). What side effects may I notice from receiving this medicine? Side effects that you should report to your doctor or health care professional as soon as possible: -allergic reactions like skin rash, itching or hives, swelling of the face, lips, or tongue -breathing problems -changes in blood pressure -feeling faint or lightheaded, falls -fever or chills -flushing, sweating, or hot feelings -swelling of the ankles or feet Side effects that usually do not require medical attention (report to your doctor or health care professional if they continue or are bothersome): -diarrhea -headache -nausea, vomiting -stomach pain This list may not describe all possible side effects. Call your doctor for medical advice about side effects. You may report side effects to FDA at 1-800-FDA-1088. Where should I keep my medicine? This drug is given in a hospital or clinic and will not be stored at home. NOTE: This sheet is a summary. It may not cover all possible information. If you have questions about this medicine, talk to your doctor, pharmacist, or health care provider.  2019 Elsevier/Gold Standard (2016-08-17 20:21:10)  

## 2018-11-14 NOTE — Progress Notes (Signed)
Hematology and Oncology Follow Up Visit  James Zuniga [id number] 13-Jan-1956 63 y.o. 11/14/2018   Principle Diagnosis:  Iron deficiency anemia Hypotestosteronemia Chronic liver lesions Pernicious anemia  Of Note: History of stroke in June 2018, no more Testosterone or ESA's  Current Therapy:   IV iron as indicated Vit B12 1 mg IM q month   Interim History:  James Zuniga is here today for follow-up. He is symptomatic with fatigue and SOB with exertion. He has had some back and leg pain. He states that he has "always had this". He has a right sided residual weakness since his stroke.  He has occasional numbness and tingling in his left hand and left foot. This comes and goes.  He has not had an bowel or bladder incontinence.  No fever, chills, n/v, cough, rash, dizziness, chest pain, palpitations, abdominal pain or changes in bowel or bladder habits.  No swelling in his extremities. He is on lasix daily to help reduce fluid retention.  He states that he spoke with his cardiologist last week after having an ECHO. His EF is 45-50%.  No lymphadenopathy noted on exam.  No episodes of bleeding, no bruising or petechiae.  He has maintained a good appetite and is staying well hydrated. His weight is stable.   ECOG Performance Status: 1 - Symptomatic but completely ambulatory  Medications:  Allergies as of 11/14/2018   No Known Allergies     Medication List       Accurate as of Nov 14, 2018  9:02 AM. Always use your most recent med list.        albuterol (2.5 MG/3ML) 0.083% nebulizer solution Commonly known as:  PROVENTIL Take 2.5 mg by nebulization every 6 (six) hours as needed for wheezing or shortness of breath.   allopurinol 300 MG tablet Commonly known as:  ZYLOPRIM Take 300 mg by mouth daily.   amLODipine 5 MG tablet Commonly known as:  NORVASC Take 5 mg by mouth daily.   aspirin 81 MG EC tablet Take 81 mg by mouth daily. Swallow whole.   atorvastatin 80 MG tablet  Commonly known as:  LIPITOR Take 1 tablet (80 mg total) by mouth every morning.   clopidogrel 75 MG tablet Commonly known as:  PLAVIX Take 1 tablet (75 mg total) by mouth daily.   DULoxetine 60 MG capsule Commonly known as:  CYMBALTA Take 60 mg by mouth 2 (two) times daily.   furosemide 20 MG tablet Commonly known as:  LASIX Take 20 mg by mouth every Tuesday, Thursday, and Saturday at 6 PM.   gabapentin 300 MG capsule Commonly known as:  NEURONTIN Take 300 mg by mouth 4 (four) times daily.   insulin NPH-regular Human (70-30) 100 UNIT/ML injection Commonly known as:  NovoLIN 70/30 Please inject 25 units twice daily before breakfast and dinner.   ipratropium-albuterol 0.5-2.5 (3) MG/3ML Soln Commonly known as:  DUONEB Take 3 mLs by nebulization every 6 (six) hours as needed. DX: J44.9   losartan 100 MG tablet Commonly known as:  COZAAR Take 100 mg by mouth daily.   metoprolol succinate 25 MG 24 hr tablet Commonly known as:  TOPROL-XL Take 1 tablet (25 mg total) by mouth daily.   metoprolol tartrate 25 MG tablet Commonly known as:  LOPRESSOR TAKE 2 TABLETS BY MOUTH TWICE DAILY   onetouch ultrasoft lancets USE 1 LANCET TO CHECK GLUCOSE IN THE MORNING BEFORE BREAKFAST   pantoprazole 20 MG tablet Commonly known as:  PROTONIX Take 20 mg  by mouth daily.   potassium chloride SA 20 MEQ tablet Commonly known as:  K-DUR Take 1 tablet (20 mEq total) by mouth 2 (two) times daily.   Precision QID Test test strip Generic drug:  glucose blood 1 each by Misc.(Non-Drug; Combo Route) route daily.   primidone 50 MG tablet Commonly known as:  MYSOLINE Take 100 mg by mouth at bedtime.   tamsulosin 0.4 MG Caps capsule Commonly known as:  FLOMAX Take 1 capsule (0.4 mg total) by mouth daily after breakfast.   tiZANidine 4 MG tablet Commonly known as:  ZANAFLEX Take 4 mg by mouth 3 (three) times daily as needed for muscle spasms.   traMADol 50 MG tablet Commonly known as:   ULTRAM Take 50 mg by mouth daily as needed for moderate pain.   VITAMIN B-12 IJ Inject 1,000 mg as directed every 30 (thirty) days. X 4   zolpidem 10 MG tablet Commonly known as:  AMBIEN Take 10 mg by mouth at bedtime.       Allergies: No Known Allergies  Past Medical History, Surgical history, Social history, and Family History were reviewed and updated.  Review of Systems: All other 10 point review of systems is negative.   Physical Exam:  vitals were not taken for this visit.   Wt Readings from Last 3 Encounters:  10/12/18 231 lb 1.9 oz (104.8 kg)  09/14/18 234 lb (106.1 kg)  08/18/18 233 lb (105.7 kg)    Ocular: Sclerae unicteric, pupils equal, round and reactive to light Ear-nose-throat: Oropharynx clear, dentition fair Lymphatic: No cervical or supraclavicular adenopathy Lungs no rales or rhonchi, good excursion bilaterally Heart regular rate and rhythm, no murmur appreciated Abd soft, nontender, positive bowel sounds, no liver or spleen tip palpated on exam, no fluid wave  MSK no focal spinal tenderness, no joint edema Neuro: non-focal, well-oriented, appropriate affect Breasts: Deferred   Lab Results  Component Value Date   WBC 5.5 10/12/2018   HGB 10.1 (L) 10/12/2018   HCT 33.9 (L) 10/12/2018   MCV 83.1 10/12/2018   PLT 333 10/12/2018   Lab Results  Component Value Date   FERRITIN 41 10/12/2018   IRON 32 (L) 10/12/2018   TIBC 299 10/12/2018   UIBC 267 10/12/2018   IRONPCTSAT 11 (L) 10/12/2018   Lab Results  Component Value Date   RETICCTPCT 2.3 10/12/2018   RBC 4.03 (L) 10/12/2018   RETICCTABS 63.0 06/20/2015   No results found for: Nils Pyle, [facility] Lab Results  Component Value Date   IGA 139 08/26/2017   No results found for: Odetta Pink, SPEI   Chemistry      Component Value Date/Time   NA 140 10/12/2018 1041   NA 141 09/25/2016 0809   K 3.1 (L) 10/12/2018  1041   K 2.8 (LL) 09/25/2016 0809   CL 103 10/12/2018 1041   CL 102 07/31/2016 0815   CO2 29 10/12/2018 1041   CO2 28 09/25/2016 0809   BUN 7 (L) 10/12/2018 1041   BUN 13.1 09/25/2016 0809   CREATININE 1.12 10/12/2018 1041   CREATININE 1.2 09/25/2016 0809      Component Value Date/Time   CALCIUM 9.4 10/12/2018 1041   CALCIUM 9.5 09/25/2016 0809   ALKPHOS 135 (H) 10/12/2018 1041   ALKPHOS 126 09/25/2016 0809   AST 13 (L) 10/12/2018 1041   AST 19 09/25/2016 0809   ALT 14 10/12/2018 1041   ALT 23 09/25/2016 0809   BILITOT 0.3  10/12/2018 1041   BILITOT 0.45 09/25/2016 0809       Impression and Plan: Mr. Gonnella is a very pleasant 63 yo African American gentleman with multifactorial anemia including pernicious,iron deficiencyand erythropoietin deficiency anemia. He is symptomatic as mentioned above.  He received IV iron today as well as his B 12. We will see what his iron studies show and bring him back in for another infusion next week if needed.  We will plan to see him back in another months.  He will contact our office with any questions or concerns. We can certainly see him sooner if need be.   Laverna Peace, NP 5/4/20209:02 AM

## 2018-11-15 DIAGNOSIS — H40013 Open angle with borderline findings, low risk, bilateral: Secondary | ICD-10-CM | POA: Diagnosis not present

## 2018-11-15 DIAGNOSIS — E119 Type 2 diabetes mellitus without complications: Secondary | ICD-10-CM | POA: Diagnosis not present

## 2018-11-16 ENCOUNTER — Other Ambulatory Visit: Payer: Self-pay | Admitting: Pulmonary Disease

## 2018-11-18 ENCOUNTER — Other Ambulatory Visit: Payer: Self-pay

## 2018-11-18 ENCOUNTER — Inpatient Hospital Stay: Payer: PPO

## 2018-11-18 VITALS — BP 115/83 | HR 74 | Temp 98.7°F | Resp 18

## 2018-11-18 DIAGNOSIS — D509 Iron deficiency anemia, unspecified: Secondary | ICD-10-CM

## 2018-11-18 MED ORDER — SODIUM CHLORIDE 0.9 % IV SOLN
510.0000 mg | Freq: Once | INTRAVENOUS | Status: AC
  Administered 2018-11-18: 510 mg via INTRAVENOUS
  Filled 2018-11-18: qty 510

## 2018-11-18 MED ORDER — SODIUM CHLORIDE 0.9 % IV SOLN
INTRAVENOUS | Status: DC
  Administered 2018-11-18: 12:00:00 via INTRAVENOUS
  Filled 2018-11-18: qty 250

## 2018-11-18 NOTE — Patient Instructions (Signed)
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
## 2018-11-30 DIAGNOSIS — J449 Chronic obstructive pulmonary disease, unspecified: Secondary | ICD-10-CM | POA: Diagnosis not present

## 2018-12-19 ENCOUNTER — Encounter: Payer: Self-pay | Admitting: Hematology & Oncology

## 2018-12-19 ENCOUNTER — Inpatient Hospital Stay (HOSPITAL_BASED_OUTPATIENT_CLINIC_OR_DEPARTMENT_OTHER): Payer: PPO | Admitting: Hematology & Oncology

## 2018-12-19 ENCOUNTER — Other Ambulatory Visit: Payer: Self-pay

## 2018-12-19 ENCOUNTER — Inpatient Hospital Stay: Payer: PPO

## 2018-12-19 ENCOUNTER — Inpatient Hospital Stay: Payer: PPO | Attending: Family

## 2018-12-19 VITALS — BP 134/83 | HR 66 | Temp 97.9°F | Resp 19 | Ht 66.0 in | Wt 228.4 lb

## 2018-12-19 DIAGNOSIS — D510 Vitamin B12 deficiency anemia due to intrinsic factor deficiency: Secondary | ICD-10-CM | POA: Diagnosis not present

## 2018-12-19 DIAGNOSIS — D509 Iron deficiency anemia, unspecified: Secondary | ICD-10-CM | POA: Diagnosis not present

## 2018-12-19 DIAGNOSIS — E291 Testicular hypofunction: Secondary | ICD-10-CM | POA: Diagnosis not present

## 2018-12-19 DIAGNOSIS — Z79899 Other long term (current) drug therapy: Secondary | ICD-10-CM | POA: Diagnosis not present

## 2018-12-19 DIAGNOSIS — R739 Hyperglycemia, unspecified: Secondary | ICD-10-CM

## 2018-12-19 DIAGNOSIS — E349 Endocrine disorder, unspecified: Secondary | ICD-10-CM

## 2018-12-19 DIAGNOSIS — K769 Liver disease, unspecified: Secondary | ICD-10-CM

## 2018-12-19 DIAGNOSIS — D631 Anemia in chronic kidney disease: Secondary | ICD-10-CM

## 2018-12-19 DIAGNOSIS — D500 Iron deficiency anemia secondary to blood loss (chronic): Secondary | ICD-10-CM

## 2018-12-19 DIAGNOSIS — N189 Chronic kidney disease, unspecified: Secondary | ICD-10-CM

## 2018-12-19 LAB — RETICULOCYTES
Immature Retic Fract: 12.7 % (ref 2.3–15.9)
RBC.: 4.48 MIL/uL (ref 4.22–5.81)
Retic Count, Absolute: 65.9 10*3/uL (ref 19.0–186.0)
Retic Ct Pct: 1.5 % (ref 0.4–3.1)

## 2018-12-19 LAB — CBC WITH DIFFERENTIAL (CANCER CENTER ONLY)
Abs Immature Granulocytes: 0.02 10*3/uL (ref 0.00–0.07)
Basophils Absolute: 0 10*3/uL (ref 0.0–0.1)
Basophils Relative: 1 %
Eosinophils Absolute: 0.1 10*3/uL (ref 0.0–0.5)
Eosinophils Relative: 3 %
HCT: 39 % (ref 39.0–52.0)
Hemoglobin: 11.5 g/dL — ABNORMAL LOW (ref 13.0–17.0)
Immature Granulocytes: 0 %
Lymphocytes Relative: 28 %
Lymphs Abs: 1.3 10*3/uL (ref 0.7–4.0)
MCH: 25.8 pg — ABNORMAL LOW (ref 26.0–34.0)
MCHC: 29.5 g/dL — ABNORMAL LOW (ref 30.0–36.0)
MCV: 87.4 fL (ref 80.0–100.0)
Monocytes Absolute: 0.3 10*3/uL (ref 0.1–1.0)
Monocytes Relative: 6 %
Neutro Abs: 2.8 10*3/uL (ref 1.7–7.7)
Neutrophils Relative %: 62 %
Platelet Count: 269 10*3/uL (ref 150–400)
RBC: 4.46 MIL/uL (ref 4.22–5.81)
RDW: 16.6 % — ABNORMAL HIGH (ref 11.5–15.5)
WBC Count: 4.5 10*3/uL (ref 4.0–10.5)
nRBC: 0 % (ref 0.0–0.2)

## 2018-12-19 LAB — CMP (CANCER CENTER ONLY)
ALT: 15 U/L (ref 0–44)
AST: 12 U/L — ABNORMAL LOW (ref 15–41)
Albumin: 3.8 g/dL (ref 3.5–5.0)
Alkaline Phosphatase: 186 U/L — ABNORMAL HIGH (ref 38–126)
Anion gap: 11 (ref 5–15)
BUN: 6 mg/dL — ABNORMAL LOW (ref 8–23)
CO2: 27 mmol/L (ref 22–32)
Calcium: 9.4 mg/dL (ref 8.9–10.3)
Chloride: 98 mmol/L (ref 98–111)
Creatinine: 1.17 mg/dL (ref 0.61–1.24)
GFR, Est AFR Am: >60 mL/min (ref >60)
GFR, Estimated: >60 mL/min (ref >60)
Glucose, Bld: 502 mg/dL — ABNORMAL HIGH (ref 70–99)
Potassium: 3.1 mmol/L — ABNORMAL LOW (ref 3.5–5.1)
Sodium: 136 mmol/L (ref 135–145)
Total Bilirubin: 0.3 mg/dL (ref 0.3–1.2)
Total Protein: 7.1 g/dL (ref 6.5–8.1)

## 2018-12-19 LAB — VITAMIN B12: Vitamin B-12: 513 pg/mL (ref 180–914)

## 2018-12-19 MED ORDER — INSULIN REGULAR HUMAN 100 UNIT/ML IJ SOLN
20.0000 [IU] | Freq: Once | INTRAMUSCULAR | Status: AC
  Administered 2018-12-19: 13:00:00 20 [IU] via SUBCUTANEOUS

## 2018-12-19 MED ORDER — CYANOCOBALAMIN 1000 MCG/ML IJ SOLN
1000.0000 ug | Freq: Once | INTRAMUSCULAR | Status: AC
  Administered 2018-12-19: 1000 ug via INTRAMUSCULAR

## 2018-12-19 MED ORDER — CYANOCOBALAMIN 1000 MCG/ML IJ SOLN
INTRAMUSCULAR | Status: AC
  Filled 2018-12-19: qty 1

## 2018-12-19 NOTE — Progress Notes (Signed)
Patient drank water while in infusion. Patient educated he must eat within 30 minutes of insulin injection. Patient verbalized understanding and denied food here. Patient states he is going to eat when he leaves the clinic.

## 2018-12-19 NOTE — Progress Notes (Signed)
Hematology and Oncology Follow Up Visit  James Zuniga [id number] 25-Jun-1956 63 y.o. 12/19/2018   Principle Diagnosis:  Iron deficiency anemia Hypotestosteronemia Chronic liver lesions Pernicious anemia  Of Note: History of stroke in June 2018, no more Testosterone or ESA's  Current Therapy:   IV iron as indicated -- dose given on 11/16/2018 Vit B12 1 mg IM q month   Interim History:  James Zuniga is here today.  He is doing okay.  He says he is quite thirsty.  He says that he is urinating quite a bit.  I think this is no surprise given that his blood sugar is 500.  I really not sure how much he follows his blood sugar.  He clearly needs to have insulin today in the office.  I will give him 20 units of regular insulin.  He is doing okay on the vitamin B-12.  He gets this monthly.  He has had 2 relatives up in [place] die from the coronavirus.  This is quite sad.  His iron was quite low when he was last year.  His ferritin was 64 with an iron saturation of only 10%.  He did get some IV iron.  Currently, his performance status is ECOG 2.    Medications:  Allergies as of 12/19/2018   No Known Allergies     Medication List       Accurate as of December 19, 2018 12:19 PM. If you have any questions, ask your nurse or doctor.        STOP taking these medications   metoprolol tartrate 25 MG tablet Commonly known as:  LOPRESSOR Stopped by:  Volanda Napoleon, MD     TAKE these medications   albuterol (2.5 MG/3ML) 0.083% nebulizer solution Commonly known as:  PROVENTIL Take 2.5 mg by nebulization every 6 (six) hours as needed for wheezing or shortness of breath.   allopurinol 300 MG tablet Commonly known as:  ZYLOPRIM Take 300 mg by mouth daily.   amLODipine 5 MG tablet Commonly known as:  NORVASC Take 5 mg by mouth daily.   aspirin 81 MG EC tablet Take 81 mg by mouth daily. Swallow whole.   atorvastatin 80 MG tablet Commonly known as:  LIPITOR Take 1 tablet (80 mg  total) by mouth every morning.   clopidogrel 75 MG tablet Commonly known as:  PLAVIX Take 1 tablet (75 mg total) by mouth daily.   diclofenac sodium 1 % Gel Commonly known as:  VOLTAREN Use 4 grams to knees tid prn   DULoxetine 60 MG capsule Commonly known as:  CYMBALTA Take 60 mg by mouth 2 (two) times daily.   furosemide 20 MG tablet Commonly known as:  LASIX Take 20 mg by mouth every Tuesday, Thursday, and Saturday at 6 PM.   gabapentin 300 MG capsule Commonly known as:  NEURONTIN Take 300 mg by mouth 4 (four) times daily.   insulin NPH-regular Human (70-30) 100 UNIT/ML injection Commonly known as:  NovoLIN 70/30 Please inject 25 units twice daily before breakfast and dinner. What changed:    how much to take  how to take this  when to take this  additional instructions   ipratropium-albuterol 0.5-2.5 (3) MG/3ML Soln Commonly known as:  DUONEB USE 1 VIAL IN NEBULIZER 4 TIMES DAILY   losartan 100 MG tablet Commonly known as:  COZAAR Take 100 mg by mouth daily.   metoprolol succinate 25 MG 24 hr tablet Commonly known as:  TOPROL-XL Take 1  tablet (25 mg total) by mouth daily. What changed:    how much to take  when to take this   mupirocin ointment 2 % Commonly known as:  BACTROBAN APPLY OINTMENT TOPICALLY TO AFFECTED AREA(S) TWICE DAILY AS NEEDED   onetouch ultrasoft lancets USE 1 LANCET TO CHECK GLUCOSE IN THE MORNING BEFORE BREAKFAST   pantoprazole 20 MG tablet Commonly known as:  PROTONIX Take 20 mg by mouth daily.   potassium chloride SA 20 MEQ tablet Commonly known as:  K-DUR Take 1 tablet (20 mEq total) by mouth 2 (two) times daily. What changed:    when to take this  additional instructions   Precision QID Test test strip Generic drug:  glucose blood 1 each by Misc.(Non-Drug; Combo Route) route daily.   primidone 50 MG tablet Commonly known as:  MYSOLINE Take 100 mg by mouth at bedtime.   sildenafil 100 MG tablet Commonly known  as:  VIAGRA Take by mouth.   tamsulosin 0.4 MG Caps capsule Commonly known as:  FLOMAX Take 1 capsule (0.4 mg total) by mouth daily after breakfast.   tiZANidine 4 MG tablet Commonly known as:  ZANAFLEX Take 4 mg by mouth 3 (three) times daily as needed for muscle spasms.   traMADol 50 MG tablet Commonly known as:  ULTRAM Take 50 mg by mouth daily as needed for moderate pain.   VITAMIN B-12 IJ Inject 1,000 mg as directed every 30 (thirty) days. X 4   zolpidem 10 MG tablet Commonly known as:  AMBIEN Take 10 mg by mouth at bedtime.       Allergies: No Known Allergies  Past Medical History, Surgical history, Social history, and Family History were reviewed and updated.  Review of Systems: Review of Systems  Constitutional: Positive for malaise/fatigue.  HENT: Negative.   Eyes: Negative.   Respiratory: Positive for shortness of breath and wheezing.   Cardiovascular: Positive for palpitations and leg swelling.  Gastrointestinal: Positive for heartburn.  Genitourinary: Negative.   Musculoskeletal: Positive for back pain, joint pain and myalgias.  Skin: Negative.   Neurological: Negative.   Endo/Heme/Allergies: Negative.   Psychiatric/Behavioral: Negative.      Physical Exam:  height is 5\' 6"  (1.676 m) and weight is 228 lb 6.4 oz (103.6 kg). His oral temperature is 97.9 F (36.6 C). His blood pressure is 134/83 and his pulse is 66. His respiration is 19 and oxygen saturation is 100%.   Wt Readings from Last 3 Encounters:  12/19/18 228 lb 6.4 oz (103.6 kg)  11/14/18 231 lb 12.8 oz (105.1 kg)  10/12/18 231 lb 1.9 oz (104.8 kg)    Physical Exam Vitals signs reviewed.  HENT:     Head: Normocephalic and atraumatic.  Eyes:     Pupils: Pupils are equal, round, and reactive to light.  Neck:     Musculoskeletal: Normal range of motion.  Cardiovascular:     Rate and Rhythm: Normal rate and regular rhythm.     Heart sounds: Normal heart sounds.  Pulmonary:      Effort: Pulmonary effort is normal.     Breath sounds: Normal breath sounds.  Abdominal:     General: Bowel sounds are normal.     Palpations: Abdomen is soft.  Musculoskeletal: Normal range of motion.        General: No tenderness or deformity.  Lymphadenopathy:     Cervical: No cervical adenopathy.  Skin:    General: Skin is warm and dry.     Findings: No  erythema or rash.  Neurological:     Mental Status: He is alert and oriented to person, place, and time.  Psychiatric:        Behavior: Behavior normal.        Thought Content: Thought content normal.        Judgment: Judgment normal.      Lab Results  Component Value Date   WBC 4.5 12/19/2018   HGB 11.5 (L) 12/19/2018   HCT 39.0 12/19/2018   MCV 87.4 12/19/2018   PLT 269 12/19/2018   Lab Results  Component Value Date   FERRITIN 64 11/14/2018   IRON 30 (L) 11/14/2018   TIBC 303 11/14/2018   UIBC 273 11/14/2018   IRONPCTSAT 10 (L) 11/14/2018   Lab Results  Component Value Date   RETICCTPCT 1.5 12/19/2018   RBC 4.46 12/19/2018   RBC 4.48 12/19/2018   RETICCTABS 63.0 06/20/2015   No results found for: Nils Pyle [facility] Lab Results  Component Value Date   IGA 139 08/26/2017   No results found for: Odetta Pink, SPEI   Chemistry      Component Value Date/Time   NA 136 12/19/2018 1101   NA 141 09/25/2016 0809   K 3.1 (L) 12/19/2018 1101   K 2.8 (LL) 09/25/2016 0809   CL 98 12/19/2018 1101   CL 102 07/31/2016 0815   CO2 27 12/19/2018 1101   CO2 28 09/25/2016 0809   BUN 6 (L) 12/19/2018 1101   BUN 13.1 09/25/2016 0809   CREATININE 1.17 12/19/2018 1101   CREATININE 1.2 09/25/2016 0809      Component Value Date/Time   CALCIUM 9.4 12/19/2018 1101   CALCIUM 9.5 09/25/2016 0809   ALKPHOS 186 (H) 12/19/2018 1101   ALKPHOS 126 09/25/2016 0809   AST 12 (L) 12/19/2018 1101   AST 19 09/25/2016 0809   ALT 15 12/19/2018 1101   ALT 23  09/25/2016 0809   BILITOT 0.3 12/19/2018 1101   BILITOT 0.45 09/25/2016 0809       Impression and Plan: James Zuniga is a very pleasant 63 yo African American gentleman with 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  He also has pernicious anemia.  We will go ahead and give him a dose of iron today.  I will also give him some vitamin B12.  Hopefully, he will get his blood sugars under better control.   I will plan to see him back in another month.  I think we have to see him relatively often because of his overall health situation.  Volanda Napoleon, MD 6/8/202012:19 PM

## 2018-12-19 NOTE — Patient Instructions (Signed)
Insulin Aspart injection What is this medicine? INSULIN ASPART (IN su lin AS part) is a human-made form of insulin. This drug lowers the amount of sugar in your blood. It is a fast acting insulin that starts working faster than regular insulin. It will not work as long as regular insulin. This medicine may be used for other purposes; ask your health care provider or pharmacist if you have questions. COMMON BRAND NAME(S): Fiasp, [employer], NovoLog, NovoLog Flexpen, NovoLog PenFill What should I tell my health care provider before I take this medicine? They need to know if you have any of these conditions: -episodes of low blood sugar -eye disease, vision problems -kidney disease -liver disease -an unusual or allergic reaction to insulin, metacresol, other medicines, foods, dyes, or preservatives -pregnant or trying to get pregnant -breast-feeding How should I use this medicine? This medicine is for injection under the skin. Use exactly as directed. It is important to follow the directions given to you by your health care professional or doctor. If you are using Novolog, you should start your meal within 5 to 10 minutes after injection. If you are using Fiasp, you should start your meal at the time of injection or within 20 minutes after injection. Have food ready before injection. Do not delay eating. You will be taught how to use this medicine and how to adjust doses for activities and illness. Do not use more insulin than prescribed. Do not use more or less often than prescribed. Always check the appearance of your insulin before using it. This medicine should be clear and colorless like water. Do not use if it is cloudy, thickened, colored, or has solid particles in it. If you use a pen, be sure to take off the outer needle cover before using the dose. It is important that you put your used needles and syringes in a special sharps container. Do not put them in a trash can. If you do not have  a sharps container, call your pharmacist or healthcare provider to get one. Talk to your pediatrician regarding the use of this medicine in children. While Novolog may be prescribed for children as young as 31 years of age for selected conditions, precautions do apply. [employer] is not approved for use in children. Overdosage: If you think you have taken too much of this medicine contact a poison control center or emergency room at once. NOTE: This medicine is only for you. Do not share this medicine with others. What if I miss a dose? It is important not to miss a dose. Your health care professional or doctor should discuss a plan for missed doses with you. If you do miss a dose, follow their plan. Do not take double doses. What may interact with this medicine? -other medicines for diabetes Many medications may cause an increase or decrease in blood sugar, these include: -alcohol containing beverages -antiviral medicines for HIV or AIDS -aspirin and aspirin-like drugs -certain medicines for depression, anxiety, or psychotic disturbances -chromium -diuretics -male hormones, like estrogens or progestins and birth control pills -heart medicines -isoniazid -MAOIs like Carbex, Eldepryl, Marplan, Nardil, and Parnate -male hormones or anabolic steroids -medicines for weight loss -medicines for allergies, asthma, cold, or cough -niacin -NSAIDs, medicines for pain and inflammation, like ibuprofen or naproxen -octreotide -pentamidine -phenytoin -probenecid -quinolone antibiotics like ciprofloxacin, levofloxacin, ofloxacin -some herbal dietary supplements -steroid medicines like prednisone or cortisone -sulfamethoxazole; trimethoprim -thyroid medicine Some medications can hide the warning symptoms of low blood sugar. You  may need to monitor your blood sugar more closely if you are taking one of these medications. These include: -beta-blockers such as atenolol, metoprolol,  propranolol -clonidine -guanethidine -reserpine This list may not describe all possible interactions. Give your health care provider a list of all the medicines, herbs, non-prescription drugs, or dietary supplements you use. Also tell them if you smoke, drink alcohol, or use illegal drugs. Some items may interact with your medicine. What should I watch for while using this medicine? Visit your health care professional or doctor for regular checks on your progress. A test called the HbA1C (A1C) will be monitored. This is a simple blood test. It measures your blood sugar control over the last 2 to 3 months. You will receive this test every 3 to 6 months. Learn how to check your blood sugar. Learn the symptoms of low and high blood sugar and how to manage them. Always carry a quick-source of sugar with you in case you have symptoms of low blood sugar. Examples include hard sugar candy or glucose tablets. Make sure others know that you can choke if you eat or drink when you develop serious symptoms of low blood sugar, such as seizures or unconsciousness. They must get medical help at once. Tell your doctor or health care professional if you have high blood sugar. You might need to change the dose of your medicine. If you are sick or exercising more than usual, you might need to change the dose of your medicine. Do not skip meals. Ask your doctor or health care professional if you should avoid alcohol. Many nonprescription cough and cold products contain sugar or alcohol. These can affect blood sugar. Make sure that you have the right kind of syringe for the type of insulin you use. Try not to change the brand and type of insulin or syringe unless your health care professional or doctor tells you to. Switching insulin brand or type can cause dangerously high or low blood sugar. Always keep an extra supply of insulin, syringes, and needles on hand. Use a syringe one time only. Throw away syringe and needle in  a closed container to prevent accidental needle sticks. Insulin pens and cartridges should never be shared. Even if the needle is changed, sharing may result in passing of viruses like hepatitis or HIV. Each time you get a new box of pen needles, check to see if they are the same type as the ones you were trained to use. If not, ask your health care professional to show you how to use this new type properly. Wear a medical ID bracelet or chain, and carry a card that describes your disease and details of your medicine and dosage times. What side effects may I notice from receiving this medicine? Side effects that you should report to your doctor or health care professional as soon as possible: -allergic reactions like skin rash, itching or hives, swelling of the face, lips, or tongue -breathing problems -signs and symptoms of high blood sugar such as dizziness, dry mouth, dry skin, fruity breath, nausea, stomach pain, increased hunger or thirst, increased urination -signs and symptoms of low blood sugar such as feeling anxious, confusion, dizziness, increased hunger, unusually weak or tired, sweating, shakiness, cold, irritable, headache, blurred vision, fast heartbeat, loss of consciousness Side effects that usually do not require medical attention (report to your doctor or health care professional if they continue or are bothersome): -increase or decrease in fatty tissue under the skin due to overuse  of a particular injection site -itching, burning, swelling, or rash at site where injected This list may not describe all possible side effects. Call your doctor for medical advice about side effects. You may report side effects to FDA at 1-800-FDA-1088. Where should I keep my medicine? Keep out of the reach of children. Store unopened insulin vials in a refrigerator between 2 and 8 degrees C (36 and 46 degrees F). Do not freeze or use if the insulin has been frozen. Opened vials (vials currently in  use) may be stored in the refrigerator or at room temperature, at approximately 30 degrees C (86 degrees F) or cooler. Keeping your insulin at room temperature decreases the amount of pain during injection. Once opened, your insulin can be used for 28 days. After 28 days, the vial of insulin should be thrown away. Store unopened cartridges, Novolog FlexPens,or [employer] in a refrigerator between 2 and 8 degrees C (36 and 46 degrees F.) Do not freeze or use if the insulin has been frozen. Once opened, the Novalog FlexPen and cartridges that are inserted into pens should be kept at room temperature, approximately 25 degrees C (77 degrees F) or cooler for up to 28 days; do not store in the refrigerator. The opened [employer] can be stored at room temperature or refrigerated for up to 28 days. After 28 days, any unused insulin in any of these opened products should be thrown away. Protect from light and excessive heat. Throw away any unused medicine after the expiration date or after the specified time for room temperature storage has passed. NOTE: This sheet is a summary. It may not cover all possible information. If you have questions about this medicine, talk to your doctor, pharmacist, or health care provider.  2019 Elsevier/Gold Standard (2017-12-31 12:05:09)  Cyanocobalamin, Vitamin B12 injection What is this medicine? CYANOCOBALAMIN (sye an oh koe BAL a min) is a man made form of vitamin B12. Vitamin B12 is used in the growth of healthy blood cells, nerve cells, and proteins in the body. It also helps with the metabolism of fats and carbohydrates. This medicine is used to treat people who can not absorb vitamin B12. This medicine may be used for other purposes; ask your health care provider or pharmacist if you have questions. COMMON BRAND NAME(S): B-12 Compliance Kit, B-12 Injection Kit, Cyomin, LA-12, Nutri-Twelve, Physicians EZ Use B-12, Primabalt What should I tell my health  care provider before I take this medicine? They need to know if you have any of these conditions: -kidney disease -Leber's disease -megaloblastic anemia -an unusual or allergic reaction to cyanocobalamin, cobalt, other medicines, foods, dyes, or preservatives -pregnant or trying to get pregnant -breast-feeding How should I use this medicine? This medicine is injected into a muscle or deeply under the skin. It is usually given by a health care professional in a clinic or doctor's office. However, your doctor may teach you how to inject yourself. Follow all instructions. Talk to your pediatrician regarding the use of this medicine in children. Special care may be needed. Overdosage: If you think you have taken too much of this medicine contact a poison control center or emergency room at once. NOTE: This medicine is only for you. Do not share this medicine with others. What if I miss a dose? If you are given your dose at a clinic or doctor's office, call to reschedule your appointment. If you give your own injections and you miss a dose, take it as  soon as you can. If it is almost time for your next dose, take only that dose. Do not take double or extra doses. What may interact with this medicine? -colchicine -heavy alcohol intake This list may not describe all possible interactions. Give your health care provider a list of all the medicines, herbs, non-prescription drugs, or dietary supplements you use. Also tell them if you smoke, drink alcohol, or use illegal drugs. Some items may interact with your medicine. What should I watch for while using this medicine? Visit your doctor or health care professional regularly. You may need blood work done while you are taking this medicine. You may need to follow a special diet. Talk to your doctor. Limit your alcohol intake and avoid smoking to get the best benefit. What side effects may I notice from receiving this medicine? Side effects that you  should report to your doctor or health care professional as soon as possible: -allergic reactions like skin rash, itching or hives, swelling of the face, lips, or tongue -blue tint to skin -chest tightness, pain -difficulty breathing, wheezing -dizziness -red, swollen painful area on the leg Side effects that usually do not require medical attention (report to your doctor or health care professional if they continue or are bothersome): -diarrhea -headache This list may not describe all possible side effects. Call your doctor for medical advice about side effects. You may report side effects to FDA at 1-800-FDA-1088. Where should I keep my medicine? Keep out of the reach of children. Store at room temperature between 15 and 30 degrees C (59 and 85 degrees F). Protect from light. Throw away any unused medicine after the expiration date. NOTE: This sheet is a summary. It may not cover all possible information. If you have questions about this medicine, talk to your doctor, pharmacist, or health care provider.  2019 Elsevier/Gold Standard (2007-10-10 22:10:20)

## 2018-12-20 LAB — IRON AND TIBC
Iron: 39 ug/dL — ABNORMAL LOW (ref 42–163)
Saturation Ratios: 15 % — ABNORMAL LOW (ref 20–55)
TIBC: 253 ug/dL (ref 202–409)
UIBC: 214 ug/dL (ref 117–376)

## 2018-12-20 LAB — FERRITIN: Ferritin: 116 ng/mL (ref 24–336)

## 2018-12-21 ENCOUNTER — Other Ambulatory Visit: Payer: Self-pay | Admitting: *Deleted

## 2018-12-21 DIAGNOSIS — Z20822 Contact with and (suspected) exposure to covid-19: Secondary | ICD-10-CM

## 2018-12-21 NOTE — Progress Notes (Signed)
[id number] 

## 2018-12-22 ENCOUNTER — Other Ambulatory Visit: Payer: Self-pay

## 2018-12-22 ENCOUNTER — Inpatient Hospital Stay: Payer: PPO

## 2018-12-22 VITALS — BP 123/76 | HR 61 | Temp 98.1°F | Resp 17

## 2018-12-22 DIAGNOSIS — D509 Iron deficiency anemia, unspecified: Secondary | ICD-10-CM

## 2018-12-22 DIAGNOSIS — D510 Vitamin B12 deficiency anemia due to intrinsic factor deficiency: Secondary | ICD-10-CM | POA: Diagnosis not present

## 2018-12-22 LAB — NOVEL CORONAVIRUS, NAA: SARS-CoV-2, NAA: NOT DETECTED

## 2018-12-22 MED ORDER — SODIUM CHLORIDE 0.9 % IV SOLN
INTRAVENOUS | Status: DC
  Administered 2018-12-22: 08:00:00 via INTRAVENOUS
  Filled 2018-12-22: qty 250

## 2018-12-22 MED ORDER — SODIUM CHLORIDE 0.9 % IV SOLN
510.0000 mg | Freq: Once | INTRAVENOUS | Status: AC
  Administered 2018-12-22: 510 mg via INTRAVENOUS
  Filled 2018-12-22: qty 17

## 2018-12-22 NOTE — Patient Instructions (Signed)

## 2018-12-23 DIAGNOSIS — Z87891 Personal history of nicotine dependence: Secondary | ICD-10-CM | POA: Diagnosis not present

## 2018-12-23 DIAGNOSIS — E1165 Type 2 diabetes mellitus with hyperglycemia: Secondary | ICD-10-CM | POA: Diagnosis not present

## 2018-12-23 DIAGNOSIS — Z794 Long term (current) use of insulin: Secondary | ICD-10-CM | POA: Diagnosis not present

## 2018-12-26 DIAGNOSIS — E876 Hypokalemia: Secondary | ICD-10-CM | POA: Diagnosis not present

## 2018-12-28 ENCOUNTER — Other Ambulatory Visit: Payer: Self-pay | Admitting: Pulmonary Disease

## 2019-01-03 DIAGNOSIS — Z9889 Other specified postprocedural states: Secondary | ICD-10-CM | POA: Diagnosis not present

## 2019-01-05 DIAGNOSIS — J449 Chronic obstructive pulmonary disease, unspecified: Secondary | ICD-10-CM | POA: Diagnosis not present

## 2019-01-16 ENCOUNTER — Inpatient Hospital Stay: Payer: PPO | Attending: Family

## 2019-01-16 ENCOUNTER — Other Ambulatory Visit: Payer: Self-pay

## 2019-01-16 VITALS — BP 134/86 | HR 72 | Temp 96.9°F | Resp 18

## 2019-01-16 DIAGNOSIS — D509 Iron deficiency anemia, unspecified: Secondary | ICD-10-CM

## 2019-01-16 DIAGNOSIS — D510 Vitamin B12 deficiency anemia due to intrinsic factor deficiency: Secondary | ICD-10-CM | POA: Insufficient documentation

## 2019-01-16 MED ORDER — CYANOCOBALAMIN 1000 MCG/ML IJ SOLN
INTRAMUSCULAR | Status: AC
  Filled 2019-01-16: qty 1

## 2019-01-16 MED ORDER — CYANOCOBALAMIN 1000 MCG/ML IJ SOLN
1000.0000 ug | Freq: Once | INTRAMUSCULAR | Status: AC
  Administered 2019-01-16: 11:00:00 1000 ug via INTRAMUSCULAR

## 2019-01-16 NOTE — Patient Instructions (Signed)

## 2019-01-23 IMAGING — DX DG CHEST 1V PORT
1 series · 1 of 1 positions shown · non-contrast
Comparison: 09/24/2016

CLINICAL DATA: Chest pain and bradycardia

EXAM:
PORTABLE CHEST 1 VIEW

[chest ap]
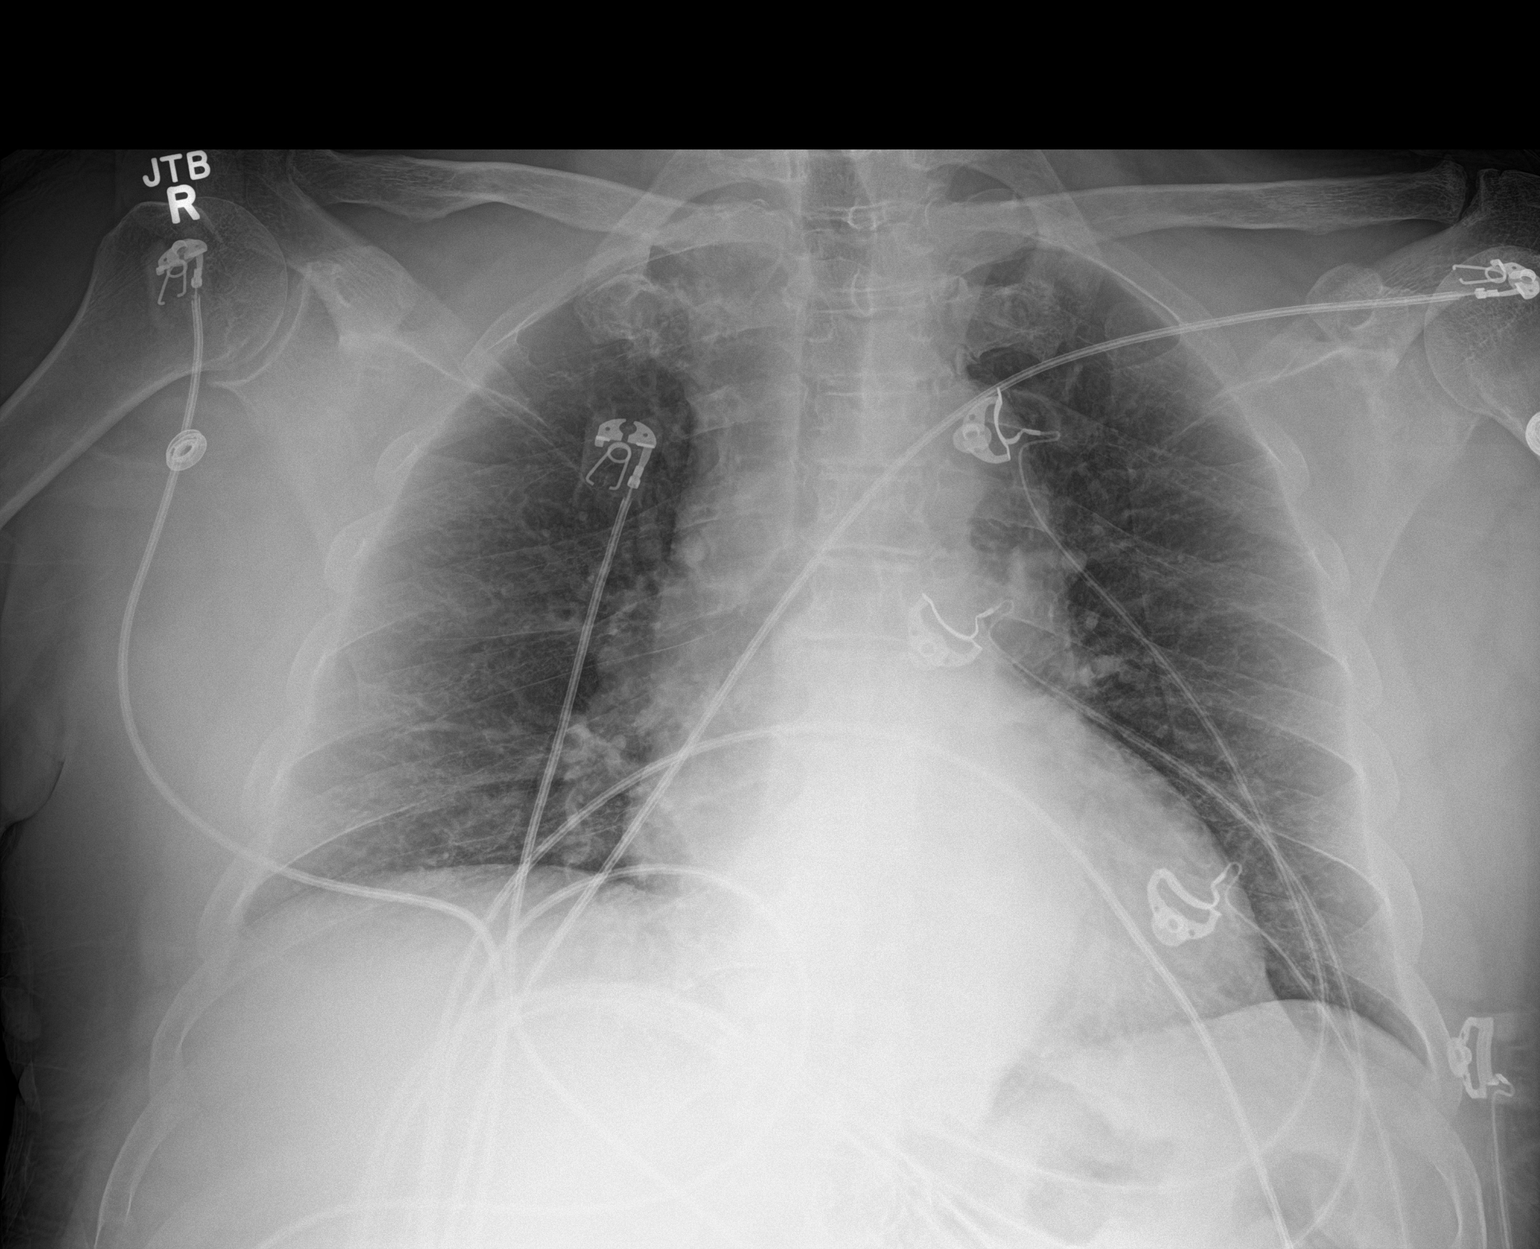

[1 of 1 positions shown; findings below may reference images not displayed]

FINDINGS: Mild cardiomegaly. Stable aortic tortuosity, accentuated by lower
lung volumes. There is no edema, consolidation, effusion, or
pneumothorax. No acute osseous finding.

EKG leads create artifact over the chest.
IMPRESSION: Stable from prior.  No acute finding.

## 2019-01-31 DIAGNOSIS — J449 Chronic obstructive pulmonary disease, unspecified: Secondary | ICD-10-CM | POA: Diagnosis not present

## 2019-02-07 DIAGNOSIS — M1712 Unilateral primary osteoarthritis, left knee: Secondary | ICD-10-CM | POA: Diagnosis not present

## 2019-02-07 DIAGNOSIS — R5383 Other fatigue: Secondary | ICD-10-CM | POA: Diagnosis not present

## 2019-02-07 DIAGNOSIS — G4733 Obstructive sleep apnea (adult) (pediatric): Secondary | ICD-10-CM | POA: Diagnosis not present

## 2019-02-07 DIAGNOSIS — Z794 Long term (current) use of insulin: Secondary | ICD-10-CM | POA: Diagnosis not present

## 2019-02-07 DIAGNOSIS — I10 Essential (primary) hypertension: Secondary | ICD-10-CM | POA: Diagnosis not present

## 2019-02-07 DIAGNOSIS — B3742 Candidal balanitis: Secondary | ICD-10-CM | POA: Diagnosis not present

## 2019-02-07 DIAGNOSIS — R5381 Other malaise: Secondary | ICD-10-CM | POA: Diagnosis not present

## 2019-02-07 DIAGNOSIS — Z87891 Personal history of nicotine dependence: Secondary | ICD-10-CM | POA: Diagnosis not present

## 2019-02-07 DIAGNOSIS — Z9989 Dependence on other enabling machines and devices: Secondary | ICD-10-CM | POA: Diagnosis not present

## 2019-02-07 DIAGNOSIS — E118 Type 2 diabetes mellitus with unspecified complications: Secondary | ICD-10-CM | POA: Diagnosis not present

## 2019-02-13 ENCOUNTER — Other Ambulatory Visit: Payer: Self-pay

## 2019-02-13 ENCOUNTER — Inpatient Hospital Stay: Payer: PPO | Attending: Family | Admitting: Hematology & Oncology

## 2019-02-13 ENCOUNTER — Telehealth: Payer: Self-pay | Admitting: *Deleted

## 2019-02-13 ENCOUNTER — Inpatient Hospital Stay: Payer: PPO

## 2019-02-13 ENCOUNTER — Encounter: Payer: Self-pay | Admitting: Hematology & Oncology

## 2019-02-13 VITALS — BP 140/88 | HR 65 | Temp 98.2°F | Resp 19 | Wt 231.0 lb

## 2019-02-13 DIAGNOSIS — D510 Vitamin B12 deficiency anemia due to intrinsic factor deficiency: Secondary | ICD-10-CM | POA: Diagnosis not present

## 2019-02-13 DIAGNOSIS — D509 Iron deficiency anemia, unspecified: Secondary | ICD-10-CM

## 2019-02-13 DIAGNOSIS — E349 Endocrine disorder, unspecified: Secondary | ICD-10-CM

## 2019-02-13 DIAGNOSIS — Z79899 Other long term (current) drug therapy: Secondary | ICD-10-CM | POA: Diagnosis not present

## 2019-02-13 DIAGNOSIS — Z794 Long term (current) use of insulin: Secondary | ICD-10-CM | POA: Diagnosis not present

## 2019-02-13 DIAGNOSIS — J984 Other disorders of lung: Secondary | ICD-10-CM | POA: Diagnosis not present

## 2019-02-13 DIAGNOSIS — Z8673 Personal history of transient ischemic attack (TIA), and cerebral infarction without residual deficits: Secondary | ICD-10-CM | POA: Diagnosis not present

## 2019-02-13 LAB — CBC WITH DIFFERENTIAL (CANCER CENTER ONLY)
Abs Immature Granulocytes: 0.01 10*3/uL (ref 0.00–0.07)
Basophils Absolute: 0 10*3/uL (ref 0.0–0.1)
Basophils Relative: 1 %
Eosinophils Absolute: 0.2 10*3/uL (ref 0.0–0.5)
Eosinophils Relative: 4 %
HCT: 39.9 % (ref 39.0–52.0)
Hemoglobin: 12.5 g/dL — ABNORMAL LOW (ref 13.0–17.0)
Immature Granulocytes: 0 %
Lymphocytes Relative: 32 %
Lymphs Abs: 1.7 10*3/uL (ref 0.7–4.0)
MCH: 26.9 pg (ref 26.0–34.0)
MCHC: 31.3 g/dL (ref 30.0–36.0)
MCV: 86 fL (ref 80.0–100.0)
Monocytes Absolute: 0.3 10*3/uL (ref 0.1–1.0)
Monocytes Relative: 6 %
Neutro Abs: 3 10*3/uL (ref 1.7–7.7)
Neutrophils Relative %: 57 %
Platelet Count: 315 10*3/uL (ref 150–400)
RBC: 4.64 MIL/uL (ref 4.22–5.81)
RDW: 15 % (ref 11.5–15.5)
WBC Count: 5.3 10*3/uL (ref 4.0–10.5)
nRBC: 0 % (ref 0.0–0.2)

## 2019-02-13 LAB — CMP (CANCER CENTER ONLY)
ALT: 21 U/L (ref 0–44)
AST: 15 U/L (ref 15–41)
Albumin: 4 g/dL (ref 3.5–5.0)
Alkaline Phosphatase: 151 U/L — ABNORMAL HIGH (ref 38–126)
Anion gap: 8 (ref 5–15)
BUN: 8 mg/dL (ref 8–23)
CO2: 32 mmol/L (ref 22–32)
Calcium: 9.4 mg/dL (ref 8.9–10.3)
Chloride: 97 mmol/L — ABNORMAL LOW (ref 98–111)
Creatinine: 1.04 mg/dL (ref 0.61–1.24)
GFR, Est AFR Am: >60 mL/min (ref >60)
GFR, Estimated: >60 mL/min (ref >60)
Glucose, Bld: 217 mg/dL — ABNORMAL HIGH (ref 70–99)
Potassium: 3 mmol/L — CL (ref 3.5–5.1)
Sodium: 137 mmol/L (ref 135–145)
Total Bilirubin: 0.3 mg/dL (ref 0.3–1.2)
Total Protein: 7.4 g/dL (ref 6.5–8.1)

## 2019-02-13 LAB — VITAMIN B12: Vitamin B-12: 578 pg/mL (ref 180–914)

## 2019-02-13 MED ORDER — CYANOCOBALAMIN 1000 MCG/ML IJ SOLN
INTRAMUSCULAR | Status: AC
  Filled 2019-02-13: qty 1

## 2019-02-13 MED ORDER — CYANOCOBALAMIN 1000 MCG/ML IJ SOLN
1000.0000 ug | Freq: Once | INTRAMUSCULAR | Status: AC
  Administered 2019-02-13: 14:00:00 1000 ug via INTRAMUSCULAR

## 2019-02-13 NOTE — Patient Instructions (Signed)
Cyanocobalamin, Pyridoxine, and Folate What is this medicine? A multivitamin containing folic acid, vitamin B6, and vitamin B12. This medicine may be used for other purposes; ask your health care provider or pharmacist if you have questions. COMMON BRAND NAME(S): AllanFol RX, AllanTex, Av-Vite FB, B Complex with Folic Acid, ComBgen, FaBB, Folamin, Folastin, Folbalin, Folbee, Folbic, Folcaps, Folgard, Folgard RX, Folgard RX 2.2, Folplex, Folplex 2.2, Foltabs 800, Foltx, Homocysteine Formula, Niva-Fol, NuFol, TL Gard RX, Virt-Gard, Virt-Vite, Virt-Vite Forte, Vita-Respa What should I tell my health care provider before I take this medicine? They need to know if you have any of these conditions:  bleeding or clotting disorder  history of anemia of any type  other chronic health condition  an unusual or allergic reaction to vitamins, other medicines, foods, dyes, or preservatives  pregnant or trying to get pregnant  breast-feeding How should I use this medicine? Take by mouth with a glass of water. May take with food. Follow the directions on the prescription label. It is usually given once a day. Do not take your medicine more often than directed. Contact your pediatrician regarding the use of this medicine in children. Special care may be needed. Overdosage: If you think you have taken too much of this medicine contact a poison control center or emergency room at once. NOTE: This medicine is only for you. Do not share this medicine with others. What if I miss a dose? If you miss a dose, take it as soon as you can. If it is almost time for your next dose, take only that dose. Do not take double or extra doses. What may interact with this medicine?  levodopa This list may not describe all possible interactions. Give your health care provider a list of all the medicines, herbs, non-prescription drugs, or dietary supplements you use. Also tell them if you smoke, drink alcohol, or use illegal  drugs. Some items may interact with your medicine. What should I watch for while using this medicine? See your health care professional for regular checks on your progress. Remember that vitamin supplements do not replace the need for good nutrition from a balanced diet. What side effects may I notice from receiving this medicine? Side effects that you should report to your doctor or health care professional as soon as possible:  allergic reaction such as skin rash or difficulty breathing  vomiting Side effects that usually do not require medical attention (report to your doctor or health care professional if they continue or are bothersome):  nausea  stomach upset This list may not describe all possible side effects. Call your doctor for medical advice about side effects. You may report side effects to FDA at 1-800-FDA-1088. Where should I keep my medicine? Keep out of the reach of children. Most vitamins should be stored at controlled room temperature. Check your specific product directions. Protect from heat and moisture. Throw away any unused medicine after the expiration date. NOTE: This sheet is a summary. It may not cover all possible information. If you have questions about this medicine, talk to your doctor, pharmacist, or health care provider.  2020 Elsevier/Gold Standard (2007-08-20 00:59:55)  

## 2019-02-13 NOTE — Telephone Encounter (Signed)
Dr. Ennever notified of K-3.0.  No new orders received at this time.  

## 2019-02-13 NOTE — Progress Notes (Signed)
Hematology and Oncology Follow Up Visit  SUHAS ESTIS [id number] 07-10-1956 63 y.o. 02/13/2019   Principle Diagnosis:  Iron deficiency anemia Hypotestosteronemia Chronic liver lesions Pernicious anemia  Of Note: History of stroke in June 2018, no more Testosterone or ESA's  Current Therapy:   IV iron as indicated -- dose given on 11/16/2018 Vit B12 1 mg IM q month   Interim History:  Mr. Googe is here today.  He is doing okay.  His blood sugars are much better controlled.  He is on insulin twice a day with a volume of regular insulin 43 units twice a day.  He is trying to be careful with the coronavirus.  He does have underlying lung disease.  He no longer smokes.  He has gained a little bit of weight.  He is on a nebulizer 4 times a day.  He has had no problems with bleeding.  He has had no issues with rashes.  He has little bit of leg swelling which I am sure is from medications.  He has been getting IV iron.  His ferritin was 116 with an iron saturation of 15%.  This was back in June.  He had a dose of IV iron.  At the same time, in June, his vitamin B12 was 513.  He has had no problems with headache.  Currently, his performance status is ECOG 1.    Medications:  Allergies as of 02/13/2019   No Known Allergies     Medication List       Accurate as of February 13, 2019  1:29 PM. If you have any questions, ask your nurse or doctor.        albuterol (2.5 MG/3ML) 0.083% nebulizer solution Commonly known as: PROVENTIL Take 2.5 mg by nebulization every 6 (six) hours as needed for wheezing or shortness of breath.   allopurinol 300 MG tablet Commonly known as: ZYLOPRIM Take 300 mg by mouth daily.   amLODipine 5 MG tablet Commonly known as: NORVASC Take 5 mg by mouth daily.   aspirin 81 MG EC tablet Take 81 mg by mouth daily. Swallow whole.   atorvastatin 80 MG tablet Commonly known as: LIPITOR Take 1 tablet (80 mg total) by mouth every morning.   clopidogrel 75  MG tablet Commonly known as: PLAVIX Take 1 tablet (75 mg total) by mouth daily.   diclofenac sodium 1 % Gel Commonly known as: VOLTAREN Use 4 grams to knees tid prn   DULoxetine 60 MG capsule Commonly known as: CYMBALTA Take 60 mg by mouth 2 (two) times daily.   furosemide 20 MG tablet Commonly known as: LASIX Take 20 mg by mouth every Tuesday, Thursday, and Saturday at 6 PM.   gabapentin 300 MG capsule Commonly known as: NEURONTIN Take 300 mg by mouth 4 (four) times daily.   insulin NPH-regular Human (70-30) 100 UNIT/ML injection Commonly known as: NovoLIN 70/30 Please inject 25 units twice daily before breakfast and dinner. What changed:   how much to take  how to take this  when to take this  additional instructions   ipratropium-albuterol 0.5-2.5 (3) MG/3ML Soln Commonly known as: DUONEB USE 1 VIAL IN NEBULIZER 4 TIMES DAILY   losartan 100 MG tablet Commonly known as: COZAAR Take 100 mg by mouth daily.   metoprolol succinate 25 MG 24 hr tablet Commonly known as: TOPROL-XL Take 1 tablet (25 mg total) by mouth daily. What changed:   how much to take  when to take this  mupirocin ointment 2 % Commonly known as: BACTROBAN APPLY OINTMENT TOPICALLY TO AFFECTED AREA(S) TWICE DAILY AS NEEDED   nystatin cream Commonly known as: MYCOSTATIN Use bid to affected area x 10 days, then prn   onetouch ultrasoft lancets USE 1 LANCET TO CHECK GLUCOSE IN THE MORNING BEFORE BREAKFAST   pantoprazole 20 MG tablet Commonly known as: PROTONIX Take 20 mg by mouth daily.   potassium chloride SA 20 MEQ tablet Commonly known as: K-DUR Take 1 tablet (20 mEq total) by mouth 2 (two) times daily. What changed:   when to take this  additional instructions   Precision QID Test test strip Generic drug: glucose blood 1 each by Misc.(Non-Drug; Combo Route) route daily.   primidone 50 MG tablet Commonly known as: MYSOLINE Take 100 mg by mouth at bedtime.   sildenafil  100 MG tablet Commonly known as: VIAGRA Take by mouth.   tamsulosin 0.4 MG Caps capsule Commonly known as: FLOMAX Take 1 capsule (0.4 mg total) by mouth daily after breakfast.   tiZANidine 4 MG tablet Commonly known as: ZANAFLEX Take 4 mg by mouth 3 (three) times daily as needed for muscle spasms.   traMADol 50 MG tablet Commonly known as: ULTRAM Take 50 mg by mouth daily as needed for moderate pain.   VITAMIN B-12 IJ Inject 1,000 mg as directed every 30 (thirty) days. X 4   zolpidem 10 MG tablet Commonly known as: AMBIEN Take 10 mg by mouth at bedtime.       Allergies: No Known Allergies  Past Medical History, Surgical history, Social history, and Family History were reviewed and updated.  Review of Systems: Review of Systems  Constitutional: Positive for malaise/fatigue.  HENT: Negative.   Eyes: Negative.   Respiratory: Positive for shortness of breath and wheezing.   Cardiovascular: Positive for palpitations and leg swelling.  Gastrointestinal: Positive for heartburn.  Genitourinary: Negative.   Musculoskeletal: Positive for back pain, joint pain and myalgias.  Skin: Negative.   Neurological: Negative.   Endo/Heme/Allergies: Negative.   Psychiatric/Behavioral: Negative.      Physical Exam:  weight is 231 lb (104.8 kg). His oral temperature is 98.2 F (36.8 C). His blood pressure is 140/88 and his pulse is 65. His respiration is 19 and oxygen saturation is 99%.   Wt Readings from Last 3 Encounters:  02/13/19 231 lb (104.8 kg)  12/19/18 228 lb 6.4 oz (103.6 kg)  11/14/18 231 lb 12.8 oz (105.1 kg)    Physical Exam Vitals signs reviewed.  HENT:     Head: Normocephalic and atraumatic.  Eyes:     Pupils: Pupils are equal, round, and reactive to light.  Neck:     Musculoskeletal: Normal range of motion.  Cardiovascular:     Rate and Rhythm: Normal rate and regular rhythm.     Heart sounds: Normal heart sounds.  Pulmonary:     Effort: Pulmonary effort  is normal.     Breath sounds: Normal breath sounds.  Abdominal:     General: Bowel sounds are normal.     Palpations: Abdomen is soft.  Musculoskeletal: Normal range of motion.        General: No tenderness or deformity.  Lymphadenopathy:     Cervical: No cervical adenopathy.  Skin:    General: Skin is warm and dry.     Findings: No erythema or rash.  Neurological:     Mental Status: He is alert and oriented to person, place, and time.  Psychiatric:  Behavior: Behavior normal.        Thought Content: Thought content normal.        Judgment: Judgment normal.      Lab Results  Component Value Date   WBC 5.3 02/13/2019   HGB 12.5 (L) 02/13/2019   HCT 39.9 02/13/2019   MCV 86.0 02/13/2019   PLT 315 02/13/2019   Lab Results  Component Value Date   FERRITIN 116 12/19/2018   IRON 39 (L) 12/19/2018   TIBC 253 12/19/2018   UIBC 214 12/19/2018   IRONPCTSAT 15 (L) 12/19/2018   Lab Results  Component Value Date   RETICCTPCT 1.5 12/19/2018   RBC 4.64 02/13/2019   RETICCTABS 63.0 06/20/2015   No results found for: Nils Pyle [facility] Lab Results  Component Value Date   IGA 139 08/26/2017   No results found for: Odetta Pink, SPEI   Chemistry      Component Value Date/Time   NA 137 02/13/2019 1201   NA 141 09/25/2016 0809   K 3.0 (LL) 02/13/2019 1201   K 2.8 (LL) 09/25/2016 0809   CL 97 (L) 02/13/2019 1201   CL 102 07/31/2016 0815   CO2 32 02/13/2019 1201   CO2 28 09/25/2016 0809   BUN 8 02/13/2019 1201   BUN 13.1 09/25/2016 0809   CREATININE 1.04 02/13/2019 1201   CREATININE 1.2 09/25/2016 0809      Component Value Date/Time   CALCIUM 9.4 02/13/2019 1201   CALCIUM 9.5 09/25/2016 0809   ALKPHOS 151 (H) 02/13/2019 1201   ALKPHOS 126 09/25/2016 0809   AST 15 02/13/2019 1201   AST 19 09/25/2016 0809   ALT 21 02/13/2019 1201   ALT 23 09/25/2016 0809   BILITOT 0.3 02/13/2019 1201    BILITOT 0.45 09/25/2016 0809       Impression and Plan: Mr. 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  He also has pernicious anemia.  I am glad to see that his hemoglobin is trending upward.  He is certainly feeling better with the iron.  He will get his vitamin B-12 today.  I will have him come back to see [place] in another 6 weeks.  I think this would be reasonable for him to see [place] and also get his vitamin B12 injection.     Volanda Napoleon, MD 8/3/20201:29 PM

## 2019-02-14 ENCOUNTER — Telehealth: Payer: Self-pay | Admitting: Hematology & Oncology

## 2019-02-14 LAB — IRON AND TIBC
Iron: 38 ug/dL — ABNORMAL LOW (ref 42–163)
Saturation Ratios: 13 % — ABNORMAL LOW (ref 20–55)
TIBC: 296 ug/dL (ref 202–409)
UIBC: 259 ug/dL (ref 117–376)

## 2019-02-14 LAB — FERRITIN: Ferritin: 109 ng/mL (ref 24–336)

## 2019-02-14 LAB — TESTOSTERONE: Testosterone: 138 ng/dL — ABNORMAL LOW (ref 264–916)

## 2019-02-14 NOTE — Telephone Encounter (Signed)
Called and spoke with patient regarding appointments added per 8/4 result note

## 2019-02-17 ENCOUNTER — Inpatient Hospital Stay: Payer: PPO

## 2019-02-17 ENCOUNTER — Other Ambulatory Visit: Payer: Self-pay

## 2019-02-17 VITALS — BP 141/90 | HR 63 | Temp 97.7°F | Resp 17

## 2019-02-17 DIAGNOSIS — D509 Iron deficiency anemia, unspecified: Secondary | ICD-10-CM

## 2019-02-17 MED ORDER — SODIUM CHLORIDE 0.9 % IV SOLN
INTRAVENOUS | Status: DC
  Administered 2019-02-17: 15:00:00 via INTRAVENOUS
  Filled 2019-02-17: qty 250

## 2019-02-17 MED ORDER — SODIUM CHLORIDE 0.9 % IV SOLN
510.0000 mg | Freq: Once | INTRAVENOUS | Status: AC
  Administered 2019-02-17: 510 mg via INTRAVENOUS
  Filled 2019-02-17: qty 510

## 2019-02-17 NOTE — Progress Notes (Signed)
Patient does not want to stay for the 30 minute post Iron observation. Patient VSS patient discharged ambulatory with cane without complaints or concerns.

## 2019-02-27 DIAGNOSIS — M1712 Unilateral primary osteoarthritis, left knee: Secondary | ICD-10-CM | POA: Diagnosis not present

## 2019-02-27 DIAGNOSIS — E118 Type 2 diabetes mellitus with unspecified complications: Secondary | ICD-10-CM | POA: Diagnosis not present

## 2019-02-27 DIAGNOSIS — Z9989 Dependence on other enabling machines and devices: Secondary | ICD-10-CM | POA: Diagnosis not present

## 2019-02-27 DIAGNOSIS — J410 Simple chronic bronchitis: Secondary | ICD-10-CM | POA: Diagnosis not present

## 2019-02-27 DIAGNOSIS — G4733 Obstructive sleep apnea (adult) (pediatric): Secondary | ICD-10-CM | POA: Diagnosis not present

## 2019-03-13 DIAGNOSIS — J449 Chronic obstructive pulmonary disease, unspecified: Secondary | ICD-10-CM | POA: Diagnosis not present

## 2019-03-14 DIAGNOSIS — E118 Type 2 diabetes mellitus with unspecified complications: Secondary | ICD-10-CM | POA: Diagnosis not present

## 2019-03-14 DIAGNOSIS — M1712 Unilateral primary osteoarthritis, left knee: Secondary | ICD-10-CM | POA: Diagnosis not present

## 2019-03-14 DIAGNOSIS — Z794 Long term (current) use of insulin: Secondary | ICD-10-CM | POA: Diagnosis not present

## 2019-03-14 DIAGNOSIS — Z87891 Personal history of nicotine dependence: Secondary | ICD-10-CM | POA: Diagnosis not present

## 2019-03-27 ENCOUNTER — Telehealth: Payer: Self-pay | Admitting: Family

## 2019-03-27 ENCOUNTER — Inpatient Hospital Stay: Payer: PPO

## 2019-03-27 ENCOUNTER — Inpatient Hospital Stay: Payer: PPO | Attending: Family | Admitting: Family

## 2019-03-27 ENCOUNTER — Other Ambulatory Visit: Payer: Self-pay

## 2019-03-27 VITALS — BP 133/85 | HR 69 | Temp 98.2°F | Resp 17 | Wt 225.0 lb

## 2019-03-27 DIAGNOSIS — Z79899 Other long term (current) drug therapy: Secondary | ICD-10-CM | POA: Insufficient documentation

## 2019-03-27 DIAGNOSIS — D509 Iron deficiency anemia, unspecified: Secondary | ICD-10-CM | POA: Insufficient documentation

## 2019-03-27 DIAGNOSIS — E349 Endocrine disorder, unspecified: Secondary | ICD-10-CM

## 2019-03-27 DIAGNOSIS — Z8673 Personal history of transient ischemic attack (TIA), and cerebral infarction without residual deficits: Secondary | ICD-10-CM | POA: Diagnosis not present

## 2019-03-27 DIAGNOSIS — D510 Vitamin B12 deficiency anemia due to intrinsic factor deficiency: Secondary | ICD-10-CM

## 2019-03-27 DIAGNOSIS — K769 Liver disease, unspecified: Secondary | ICD-10-CM | POA: Insufficient documentation

## 2019-03-27 DIAGNOSIS — D500 Iron deficiency anemia secondary to blood loss (chronic): Secondary | ICD-10-CM | POA: Diagnosis not present

## 2019-03-27 LAB — CMP (CANCER CENTER ONLY)
ALT: 25 U/L (ref 0–44)
AST: 17 U/L (ref 15–41)
Albumin: 4 g/dL (ref 3.5–5.0)
Alkaline Phosphatase: 143 U/L — ABNORMAL HIGH (ref 38–126)
Anion gap: 9 (ref 5–15)
BUN: 9 mg/dL (ref 8–23)
CO2: 32 mmol/L (ref 22–32)
Calcium: 9.5 mg/dL (ref 8.9–10.3)
Chloride: 99 mmol/L (ref 98–111)
Creatinine: 0.93 mg/dL (ref 0.61–1.24)
GFR, Est AFR Am: >60 mL/min (ref >60)
GFR, Estimated: >60 mL/min (ref >60)
Glucose, Bld: 211 mg/dL — ABNORMAL HIGH (ref 70–99)
Potassium: 3.3 mmol/L — ABNORMAL LOW (ref 3.5–5.1)
Sodium: 140 mmol/L (ref 135–145)
Total Bilirubin: 0.4 mg/dL (ref 0.3–1.2)
Total Protein: 7.2 g/dL (ref 6.5–8.1)

## 2019-03-27 LAB — CBC WITH DIFFERENTIAL (CANCER CENTER ONLY)
Abs Immature Granulocytes: 0.01 10*3/uL (ref 0.00–0.07)
Basophils Absolute: 0 10*3/uL (ref 0.0–0.1)
Basophils Relative: 1 %
Eosinophils Absolute: 0.1 10*3/uL (ref 0.0–0.5)
Eosinophils Relative: 3 %
HCT: 39.4 % (ref 39.0–52.0)
Hemoglobin: 12.2 g/dL — ABNORMAL LOW (ref 13.0–17.0)
Immature Granulocytes: 0 %
Lymphocytes Relative: 27 %
Lymphs Abs: 1.4 10*3/uL (ref 0.7–4.0)
MCH: 27.5 pg (ref 26.0–34.0)
MCHC: 31 g/dL (ref 30.0–36.0)
MCV: 88.7 fL (ref 80.0–100.0)
Monocytes Absolute: 0.3 10*3/uL (ref 0.1–1.0)
Monocytes Relative: 5 %
Neutro Abs: 3.4 10*3/uL (ref 1.7–7.7)
Neutrophils Relative %: 64 %
Platelet Count: 313 10*3/uL (ref 150–400)
RBC: 4.44 MIL/uL (ref 4.22–5.81)
RDW: 14.9 % (ref 11.5–15.5)
WBC Count: 5.2 10*3/uL (ref 4.0–10.5)
nRBC: 0 % (ref 0.0–0.2)

## 2019-03-27 LAB — VITAMIN B12: Vitamin B-12: 490 pg/mL (ref 180–914)

## 2019-03-27 MED ORDER — CYANOCOBALAMIN 1000 MCG/ML IJ SOLN
INTRAMUSCULAR | Status: AC
  Filled 2019-03-27: qty 1

## 2019-03-27 NOTE — Telephone Encounter (Signed)
Tried calling patient no answer or voice mail.  Letter/Calendar mailed per 9/14 los

## 2019-03-27 NOTE — Progress Notes (Signed)
Hematology and Oncology Follow Up Visit  James Zuniga [id number] August 22, 1955 63 y.o. 03/27/2019   Principle Diagnosis:  Iron deficiency anemia Hypotestosteronemia Chronic liver lesions Pernicious anemia  Of Note: History of stroke in June 2018, no more Testosterone or ESA's  Current Therapy:   IV iron as indicated -- dose given on 11/16/2018 Vit B12 1 mg IM q month   Interim History:  James Zuniga is here today for follow-up. He is doing well but does note some occasional fatigue.  He is scheduled to have a sleep study test this week on Friday.  No episodes of bleeding, no bruising or petechiae.  He has occasional episodes of SOB with over exertion and will take a break to rest when needed.  No fever, chills, n/v, cough, rash, dizziness, SOB, chest pain, palpitations, abdominal pain or changes in bowel or bladder habits.  No swelling or tenderness in his extremities at this time. He has numbness and tingling in his fingertips and feet that waxes and wanes.  He did recently fall out of bed but thankfully states he was not injured. No falls while ambulating or syncopal episodes to report. He uses a cane when walking for added support.  He has maintained a good appetite and is staying well hydrated. His weight is stable.   ECOG Performance Status: 1 - Symptomatic but completely ambulatory  Medications:  Allergies as of 03/27/2019   No Known Allergies     Medication List       Accurate as of March 27, 2019 10:59 AM. If you have any questions, ask your nurse or doctor.        albuterol (2.5 MG/3ML) 0.083% nebulizer solution Commonly known as: PROVENTIL Take 2.5 mg by nebulization every 6 (six) hours as needed for wheezing or shortness of breath.   allopurinol 300 MG tablet Commonly known as: ZYLOPRIM Take 300 mg by mouth daily.   amLODipine 5 MG tablet Commonly known as: NORVASC Take 5 mg by mouth daily.   aspirin 81 MG EC tablet Take 81 mg by mouth daily. Swallow  whole.   atorvastatin 80 MG tablet Commonly known as: LIPITOR Take 1 tablet (80 mg total) by mouth every morning.   clopidogrel 75 MG tablet Commonly known as: PLAVIX Take 1 tablet (75 mg total) by mouth daily.   diclofenac sodium 1 % Gel Commonly known as: VOLTAREN Use 4 grams to knees tid prn   DULoxetine 60 MG capsule Commonly known as: CYMBALTA Take 60 mg by mouth 2 (two) times daily.   furosemide 20 MG tablet Commonly known as: LASIX Take 20 mg by mouth every Tuesday, Thursday, and Saturday at 6 PM.   gabapentin 300 MG capsule Commonly known as: NEURONTIN Take 300 mg by mouth 4 (four) times daily.   insulin NPH-regular Human (70-30) 100 UNIT/ML injection Commonly known as: NovoLIN 70/30 Please inject 25 units twice daily before breakfast and dinner. What changed:   how much to take  how to take this  when to take this  additional instructions   ipratropium-albuterol 0.5-2.5 (3) MG/3ML Soln Commonly known as: DUONEB USE 1 VIAL IN NEBULIZER 4 TIMES DAILY   losartan 100 MG tablet Commonly known as: COZAAR Take 100 mg by mouth daily.   metoprolol succinate 25 MG 24 hr tablet Commonly known as: TOPROL-XL Take 1 tablet (25 mg total) by mouth daily. What changed:   how much to take  when to take this   mupirocin ointment 2 % Commonly known as:  BACTROBAN APPLY OINTMENT TOPICALLY TO AFFECTED AREA(S) TWICE DAILY AS NEEDED   nystatin cream Commonly known as: MYCOSTATIN Use bid to affected area x 10 days, then prn   onetouch ultrasoft lancets USE 1 LANCET TO CHECK GLUCOSE IN THE MORNING BEFORE BREAKFAST   pantoprazole 20 MG tablet Commonly known as: PROTONIX Take 20 mg by mouth daily.   potassium chloride SA 20 MEQ tablet Commonly known as: K-DUR Take 1 tablet (20 mEq total) by mouth 2 (two) times daily. What changed:   when to take this  additional instructions   Precision QID Test test strip Generic drug: glucose blood 1 each by  Misc.(Non-Drug; Combo Route) route daily.   primidone 50 MG tablet Commonly known as: MYSOLINE Take 100 mg by mouth at bedtime.   sildenafil 100 MG tablet Commonly known as: VIAGRA Take by mouth.   tamsulosin 0.4 MG Caps capsule Commonly known as: FLOMAX Take 1 capsule (0.4 mg total) by mouth daily after breakfast.   tiZANidine 4 MG tablet Commonly known as: ZANAFLEX Take 4 mg by mouth 3 (three) times daily as needed for muscle spasms.   traMADol 50 MG tablet Commonly known as: ULTRAM Take 50 mg by mouth daily as needed for moderate pain.   VITAMIN B-12 IJ Inject 1,000 mg as directed every 30 (thirty) days. X 4   zolpidem 10 MG tablet Commonly known as: AMBIEN Take 10 mg by mouth at bedtime.       Allergies: No Known Allergies  Past Medical History, Surgical history, Social history, and Family History were reviewed and updated.  Review of Systems: All other 10 point review of systems is negative.   Physical Exam:  vitals were not taken for this visit.   Wt Readings from Last 3 Encounters:  02/13/19 231 lb (104.8 kg)  12/19/18 228 lb 6.4 oz (103.6 kg)  11/14/18 231 lb 12.8 oz (105.1 kg)    Ocular: Sclerae unicteric, pupils equal, round and reactive to light Ear-nose-throat: Oropharynx clear, dentition fair Lymphatic: No cervical or supraclavicular adenopathy Lungs no rales or rhonchi, good excursion bilaterally Heart regular rate and rhythm, no murmur appreciated Abd soft, nontender, positive bowel sounds, no liver or spleen tip palpated on exam, no fluid wave  MSK no focal spinal tenderness, no joint edema Neuro: non-focal, well-oriented, appropriate affect Breasts: Deferred   Lab Results  Component Value Date   WBC 5.2 03/27/2019   HGB 12.2 (L) 03/27/2019   HCT 39.4 03/27/2019   MCV 88.7 03/27/2019   PLT 313 03/27/2019   Lab Results  Component Value Date   FERRITIN 109 02/13/2019   IRON 38 (L) 02/13/2019   TIBC 296 02/13/2019   UIBC 259  02/13/2019   IRONPCTSAT 13 (L) 02/13/2019   Lab Results  Component Value Date   RETICCTPCT 1.5 12/19/2018   RBC 4.44 03/27/2019   RETICCTABS 63.0 06/20/2015   No results found for: KPAFRELGTCHN, LAMBDASER, [facility] Lab Results  Component Value Date   IGA 139 08/26/2017   No results found for: Odetta Pink, SPEI   Chemistry      Component Value Date/Time   NA 137 02/13/2019 1201   NA 141 09/25/2016 0809   K 3.0 (LL) 02/13/2019 1201   K 2.8 (LL) 09/25/2016 0809   CL 97 (L) 02/13/2019 1201   CL 102 07/31/2016 0815   CO2 32 02/13/2019 1201   CO2 28 09/25/2016 0809   BUN 8 02/13/2019 1201   BUN 13.1 09/25/2016  0809   CREATININE 1.04 02/13/2019 1201   CREATININE 1.2 09/25/2016 0809      Component Value Date/Time   CALCIUM 9.4 02/13/2019 1201   CALCIUM 9.5 09/25/2016 0809   ALKPHOS 151 (H) 02/13/2019 1201   ALKPHOS 126 09/25/2016 0809   AST 15 02/13/2019 1201   AST 19 09/25/2016 0809   ALT 21 02/13/2019 1201   ALT 23 09/25/2016 0809   BILITOT 0.3 02/13/2019 1201   BILITOT 0.45 09/25/2016 0809       Impression and Plan: James Zuniga is a very pleasant 63 yo African American gentleman with multifactorial anemia.  We will see what his iron studies show and bring him back in for infusion if needed.  We will plan to see him back in another 6 weeks.  He will contact our office with any questions or concerns. We can certainly see him sooner if needed.   Laverna Peace, NP 9/14/202010:59 AM

## 2019-03-28 LAB — IRON AND TIBC
Iron: 59 ug/dL (ref 42–163)
Saturation Ratios: 21 % (ref 20–55)
TIBC: 276 ug/dL (ref 202–409)
UIBC: 217 ug/dL (ref 117–376)

## 2019-03-28 LAB — FERRITIN: Ferritin: 187 ng/mL (ref 24–336)

## 2019-03-28 LAB — TESTOSTERONE: Testosterone: 158 ng/dL — ABNORMAL LOW (ref 264–916)

## 2019-04-03 DIAGNOSIS — J431 Panlobular emphysema: Secondary | ICD-10-CM | POA: Diagnosis not present

## 2019-04-03 DIAGNOSIS — Z4509 Encounter for adjustment and management of other cardiac device: Secondary | ICD-10-CM | POA: Diagnosis not present

## 2019-04-03 DIAGNOSIS — I10 Essential (primary) hypertension: Secondary | ICD-10-CM | POA: Diagnosis not present

## 2019-04-03 DIAGNOSIS — Z95818 Presence of other cardiac implants and grafts: Secondary | ICD-10-CM | POA: Diagnosis not present

## 2019-04-03 DIAGNOSIS — I428 Other cardiomyopathies: Secondary | ICD-10-CM | POA: Diagnosis not present

## 2019-04-06 ENCOUNTER — Encounter: Payer: Self-pay | Admitting: Pulmonary Disease

## 2019-04-06 ENCOUNTER — Other Ambulatory Visit: Payer: Self-pay

## 2019-04-06 ENCOUNTER — Ambulatory Visit: Payer: PPO | Admitting: Pulmonary Disease

## 2019-04-06 VITALS — BP 128/72 | HR 76 | Temp 97.0°F | Ht 66.0 in | Wt 232.2 lb

## 2019-04-06 DIAGNOSIS — G4730 Sleep apnea, unspecified: Secondary | ICD-10-CM | POA: Diagnosis not present

## 2019-04-06 DIAGNOSIS — F513 Sleepwalking [somnambulism]: Secondary | ICD-10-CM

## 2019-04-06 DIAGNOSIS — Z8673 Personal history of transient ischemic attack (TIA), and cerebral infarction without residual deficits: Secondary | ICD-10-CM | POA: Diagnosis not present

## 2019-04-06 DIAGNOSIS — J438 Other emphysema: Secondary | ICD-10-CM

## 2019-04-06 DIAGNOSIS — I5022 Chronic systolic (congestive) heart failure: Secondary | ICD-10-CM | POA: Diagnosis not present

## 2019-04-06 DIAGNOSIS — G4751 Confusional arousals: Secondary | ICD-10-CM

## 2019-04-06 DIAGNOSIS — Z23 Encounter for immunization: Secondary | ICD-10-CM | POA: Diagnosis not present

## 2019-04-06 NOTE — Progress Notes (Signed)
   Subjective:    Patient ID: James Zuniga, male    DOB: April 01, 1956, 63 y.o.   MRN: [id number]  HPI    Review of Systems  Constitutional: Positive for unexpected weight change.  HENT: Positive for dental problem.   Eyes: Positive for itching.  Respiratory: Positive for chest tightness, shortness of breath and wheezing.   Cardiovascular: Positive for palpitations and leg swelling.  Allergic/Immunologic: Negative.  Negative for environmental allergies, food allergies and immunocompromised state.  Hematological: Bruises/bleeds easily.       Objective:   Physical Exam        Assessment & Plan:

## 2019-04-06 NOTE — Patient Instructions (Signed)
Flu shot today  Will arrange for in lab sleep study  Will call to arrange for follow up after sleep study reviewed

## 2019-04-06 NOTE — Progress Notes (Signed)
[facility]  Chief Complaint  Patient presents with   OSA on CPAP    Has been having problems with machine for last 6 months, does not feel like he is getting enough air during use of CPAP. Only gets 5 - 6 hours of sleep a night.    Constitutional:  BP 128/72 (BP Location: Right Arm, Patient Position: Sitting, Cuff Size: Normal)    Pulse 76    Temp (!) 97 F (36.1 C)    Ht 5\' 6"  (1.676 m)    Wt 232 lb 3.2 oz (105.3 kg)    SpO2 100% Comment: on room air   BMI 37.48 kg/m   Past Medical History:  CVA, ETOH, OA, DM, GERD, CHF, HLD, Gout, HTN, Low T, Vit D deficiency  Brief Summary:  James Zuniga is a 63 y.o. male former smoker with obstructive sleep apnea.  He had sleep study several years ago in [place].  He doesn't recall exact location, and doesn't recall the DME he uses.  He was apparently found to have sleep apnea and started on CPAP and oxygen at night.  He couldn't afford payments for oxygen set up, and his DME took the equipment away.  He still has a CPAP machine.  Reports last getting equipment couple months ago.  His wife says he is snoring more.  He doesn't feel like the machine is delivering pressure.   He goes to sleep at random times.  He falls asleep after about 30 minutes.  He wakes up 4 times to use the bathroom.  He gets out of bed at 7 am.  He feels tired in the morning.  He denies morning headache.  He does not use anything to help him stay awake.  He has been using [place] for the past 10 years.  Several of his other medications make him feel sleeping.  He will wake up at times w/o realizing that he is awake.  His wife has told him that he will sometimes go to the kitchen and get something to eat.  He has also fallen out of bed several times.    He denies bruxism, or nightmares.  There is no history of restless legs.  He denies sleep hallucinations, sleep paralysis, or cataplexy.  The Epworth score is 7 out of 24.  He uses  nebulizer several times per day.  This helps.  No longer smoking.  Has occasional cough with chest congestion.  Physical Exam:   Appearance - well kempt   ENMT - clear nasal mucosa, midline nasal  septum, no oral exudates, no LAN, trachea midline, poor dentition  Respiratory - normal chest wall, normal respiratory effort, no accessory muscle use, no wheeze/rales  CV - s1s2 regular rate and rhythm, no murmurs, no peripheral edema, radial pulses symmetric  GI - soft, non tender, no masses  Lymph - no adenopathy noted in neck and axillary areas  MSK - walks with a cane  Ext - no cyanosis, clubbing, or joint inflammation noted  Skin - no rashes, lesions, or ulcers  Neuro - normal strength, oriented x 3  Psych - normal mood and affect  Discussion:  He has snoring, sleep disruption, apnea, and daytime sleepiness.  He has history of hypertension, CVA, systolic CHF, COPD with emphysema, and DM.  He reports history of obstructive sleep apnea, but was apparently needing CPAP and supplemental oxygen.  He also reports several parasomnias (sleep walking, sleep talking, confusional arousals).  I feel  he needs in lab sleep study to further assess for sleep disordered breathing with possible obstructive sleep apnea, central sleep apnea, and/or hypoventilation.  He also needs in lab study to assess nature of his parasomnias.  Plan:   Snoring with excessive daytime sleepiness. - will need to arrange for a in lab split sleep study  Obesity. - discussed how weight can impact sleep and risk for sleep disordered breathing - discussed options to assist with weight loss: combination of diet modification, cardiovascular and strength training exercises  Cardiovascular risk. - had an extensive discussion regarding the adverse health consequences related to untreated sleep disordered breathing - specifically discussed the risks for hypertension, coronary artery disease, cardiac dysrhythmias,  cerebrovascular disease, and diabetes - lifestyle modification discussed  Safe driving practices. - discussed how sleep disruption can increase risk of accidents, particularly when driving - safe driving practices were discussed  Therapies for obstructive sleep apnea. - if the sleep study shows significant sleep apnea, then various therapies for treatment were reviewed: CPAP, oral appliance, and surgical interventions  COPD with emphysema. - he has trouble affording medications - continue prn duoneb - flu shot today    Patient Instructions  Flu shot today  Will arrange for in lab sleep study  Will call to arrange for follow up after sleep study reviewed     Chesley Mires, MD [place] Pager: [phone] 04/06/2019, 11:17 AM  Flow Sheet     Pulmonary tests:  PFT 01/27/13 >> FEV1 2.50 (84%), FEV1% 83, TLC 4.06 (61%), DLCO 60%, no BD    CT chest 03/02/13 >> mild paraseptal emphysema, 4 mm RUL nodule CT chest 07/26/15 >> stable RUL nodule  Sleep tests:    Cardiac tests:  Echo 12/25/16 >> EF 40 to 45%, PAS 33 mmHg, mild MR  Review of Systems:  Constitutional: Positive for unexpected weight change.  HENT: Positive for dental problem.   Eyes: Positive for itching.  Respiratory: Positive for chest tightness, shortness of breath and wheezing.   Cardiovascular: Positive for palpitations and leg swelling.  Allergic/Immunologic: Negative.  Negative for environmental allergies, food allergies and immunocompromised state.  Hematological: Bruises/bleeds easily.   Medications:   Allergies as of 04/06/2019   No Known Allergies     Medication List       Accurate as of April 06, 2019 11:17 AM. If you have any questions, ask your nurse or doctor.        albuterol (2.5 MG/3ML) 0.083% nebulizer solution Commonly known as: PROVENTIL Take 2.5 mg by nebulization every 6 (six) hours as needed for wheezing or shortness of breath.   allopurinol 300 MG  tablet Commonly known as: ZYLOPRIM Take 300 mg by mouth daily.   amLODipine 5 MG tablet Commonly known as: NORVASC Take 5 mg by mouth daily.   aspirin 81 MG EC tablet Take 81 mg by mouth daily. Swallow whole.   atorvastatin 80 MG tablet Commonly known as: LIPITOR Take 1 tablet (80 mg total) by mouth every morning.   clopidogrel 75 MG tablet Commonly known as: PLAVIX Take 1 tablet (75 mg total) by mouth daily.   diclofenac sodium 1 % Gel Commonly known as: VOLTAREN Use 4 grams to knees tid prn   DULoxetine 60 MG capsule Commonly known as: CYMBALTA Take 60 mg by mouth 2 (two) times daily.   furosemide 20 MG tablet Commonly known as: LASIX Take 20 mg by mouth every Tuesday, Thursday, and Saturday at 6 PM.   gabapentin 300 MG capsule  Commonly known as: NEURONTIN Take 300 mg by mouth 4 (four) times daily.   insulin NPH-regular Human (70-30) 100 UNIT/ML injection Commonly known as: NovoLIN 70/30 Please inject 25 units twice daily before breakfast and dinner. What changed:   how much to take  how to take this  when to take this  additional instructions   ipratropium-albuterol 0.5-2.5 (3) MG/3ML Soln Commonly known as: DUONEB USE 1 VIAL IN NEBULIZER 4 TIMES DAILY   losartan 100 MG tablet Commonly known as: COZAAR Take 100 mg by mouth daily.   metoprolol succinate 25 MG 24 hr tablet Commonly known as: TOPROL-XL Take 1 tablet (25 mg total) by mouth daily. What changed:   how much to take  when to take this   mupirocin ointment 2 % Commonly known as: BACTROBAN APPLY OINTMENT TOPICALLY TO AFFECTED AREA(S) TWICE DAILY AS NEEDED   nystatin cream Commonly known as: MYCOSTATIN Use bid to affected area x 10 days, then prn   onetouch ultrasoft lancets USE 1 LANCET TO CHECK GLUCOSE IN THE MORNING BEFORE BREAKFAST   pantoprazole 20 MG tablet Commonly known as: PROTONIX Take 20 mg by mouth daily.   potassium chloride SA 20 MEQ tablet Commonly known as:  K-DUR Take 1 tablet (20 mEq total) by mouth 2 (two) times daily. What changed:   when to take this  additional instructions   Precision QID Test test strip Generic drug: glucose blood 1 each by Misc.(Non-Drug; Combo Route) route daily.   primidone 50 MG tablet Commonly known as: MYSOLINE Take 100 mg by mouth at bedtime.   sildenafil 100 MG tablet Commonly known as: VIAGRA Take by mouth.   tamsulosin 0.4 MG Caps capsule Commonly known as: FLOMAX Take 1 capsule (0.4 mg total) by mouth daily after breakfast.   tiZANidine 4 MG tablet Commonly known as: ZANAFLEX Take 4 mg by mouth 3 (three) times daily as needed for muscle spasms.   traMADol 50 MG tablet Commonly known as: ULTRAM Take 50 mg by mouth daily as needed for moderate pain.   Tyler Aas FlexTouch 200 UNIT/ML Sopn Generic drug: Insulin Degludec 60 units once daily per patient.   VITAMIN B-12 IJ Inject 1,000 mg as directed every 30 (thirty) days. X 4   zolpidem 10 MG tablet Commonly known as: AMBIEN Take 10 mg by mouth at bedtime.       Past Surgical History:  He  has a past surgical history that includes Knee arthroscopy (Left); TEE without cardioversion (N/A, 12/24/2016); Loop recorder implant (06/2017); Esophagogastroduodenoscopy (N/A, 08/26/2017); Colonoscopy (N/A, 08/26/2017); enteroscopy (N/A, 05/24/2018); Hot hemostasis (N/A, 05/24/2018); and polypectomy (05/24/2018).  Family History:  His family history includes Diabetes in his father and sister; Heart attack in his brother and brother; Heart disease in his mother; Stroke in his brother and paternal uncle.  Social History:  He  reports that he quit smoking about 19 months ago. His smoking use included cigarettes. He started smoking about 35 years ago. He has a 20.00 pack-year smoking history. He has never used smokeless tobacco. He reports that he does not drink alcohol or use drugs.

## 2019-04-10 DIAGNOSIS — H1032 Unspecified acute conjunctivitis, left eye: Secondary | ICD-10-CM | POA: Diagnosis not present

## 2019-04-10 DIAGNOSIS — J449 Chronic obstructive pulmonary disease, unspecified: Secondary | ICD-10-CM | POA: Diagnosis not present

## 2019-04-17 ENCOUNTER — Other Ambulatory Visit (HOSPITAL_COMMUNITY)
Admission: RE | Admit: 2019-04-17 | Discharge: 2019-04-17 | Disposition: A | Discharge status: Home / Self Care | Payer: PPO | Source: Ambulatory Visit | Attending: Pulmonary Disease | Admitting: Pulmonary Disease

## 2019-04-17 DIAGNOSIS — Z01812 Encounter for preprocedural laboratory examination: Secondary | ICD-10-CM | POA: Insufficient documentation

## 2019-04-17 DIAGNOSIS — Z20828 Contact with and (suspected) exposure to other viral communicable diseases: Secondary | ICD-10-CM | POA: Diagnosis not present

## 2019-04-18 LAB — NOVEL CORONAVIRUS, NAA (HOSP ORDER, SEND-OUT TO REF LAB; TAT 18-24 HRS): SARS-CoV-2, NAA: NOT DETECTED

## 2019-04-21 ENCOUNTER — Ambulatory Visit (HOSPITAL_BASED_OUTPATIENT_CLINIC_OR_DEPARTMENT_OTHER): Payer: PPO | Attending: Pulmonary Disease | Admitting: Pulmonary Disease

## 2019-04-21 DIAGNOSIS — G4733 Obstructive sleep apnea (adult) (pediatric): Secondary | ICD-10-CM | POA: Diagnosis not present

## 2019-04-21 DIAGNOSIS — I5022 Chronic systolic (congestive) heart failure: Secondary | ICD-10-CM | POA: Diagnosis not present

## 2019-04-21 DIAGNOSIS — F513 Sleepwalking [somnambulism]: Secondary | ICD-10-CM | POA: Diagnosis not present

## 2019-04-21 DIAGNOSIS — J438 Other emphysema: Secondary | ICD-10-CM | POA: Diagnosis not present

## 2019-04-21 DIAGNOSIS — Z8673 Personal history of transient ischemic attack (TIA), and cerebral infarction without residual deficits: Secondary | ICD-10-CM | POA: Diagnosis not present

## 2019-04-21 DIAGNOSIS — G4751 Confusional arousals: Secondary | ICD-10-CM | POA: Diagnosis not present

## 2019-04-21 DIAGNOSIS — G4730 Sleep apnea, unspecified: Secondary | ICD-10-CM

## 2019-04-27 ENCOUNTER — Telehealth: Payer: Self-pay | Admitting: Pulmonary Disease

## 2019-04-27 DIAGNOSIS — J438 Other emphysema: Secondary | ICD-10-CM | POA: Diagnosis not present

## 2019-04-27 NOTE — Telephone Encounter (Signed)
Unable to reach patient on contact number provided. Does not have voicemail set up.  Called patient on his wife's contact number. Was able to advise the patient of result information. Patient scheduled for appointment on 05/04/19 at 9:45. Nothing further needed at this time.

## 2019-04-27 NOTE — Telephone Encounter (Signed)
PSG 04/21/19 >> AHI 15.5, SpO2 low 83%   Please inform him that his sleep study shows moderate obstructive sleep apnea.  Please arrange for ROV with me or NP to discuss treatment options.

## 2019-04-27 NOTE — Procedures (Signed)
    Patient Name: James Zuniga, James Zuniga Date: 04/21/2019 Gender: Male D.O.B: January 28, 1956 Age (years): 15 Referring Provider: Chesley Mires MD, ABSM Height (inches): 28 Interpreting Physician: Chesley Mires MD, ABSM Weight (lbs): 225 RPSGT: Baxter Flattery BMI: 36 MRN: [id number] Neck Size: 17.00  CLINICAL INFORMATION Sleep Study Type: NPSG  Indication for sleep study: Fatigue, Snoring, Witnesses Apnea / Gasping During Sleep  Epworth Sleepiness Score: 6  SLEEP STUDY TECHNIQUE As per the AASM Manual for the Scoring of Sleep and Associated Events v2.3 (April 2016) with a hypopnea requiring 4% desaturations.  The channels recorded and monitored were frontal, central and occipital EEG, electrooculogram (EOG), submentalis EMG (chin), nasal and oral airflow, thoracic and abdominal wall motion, anterior tibialis EMG, snore microphone, electrocardiogram, and pulse oximetry.  MEDICATIONS Medications self-administered by patient taken the night of the study : ZOLPIDEM TARTRATE  SLEEP ARCHITECTURE The study was initiated at 10:16:12 PM and ended at 4:38:32 AM.  Sleep onset time was 14.4 minutes and the sleep efficiency was 92.2%%. The total sleep time was 352.4 minutes.  Stage REM latency was 185.0 minutes.  The patient spent 1.6%% of the night in stage N1 sleep, 82.4%% in stage N2 sleep, 0.0%% in stage N3 and 16% in REM.  Alpha intrusion was absent.  Supine sleep was 45.12%.  RESPIRATORY PARAMETERS The overall apnea/hypopnea index (AHI) was 15.5 per hour. There were 8 total apneas, including 8 obstructive, 0 central and 0 mixed apneas. There were 83 hypopneas and 0 RERAs.  The AHI during Stage REM sleep was 20.2 per hour.  AHI while supine was 17.4 per hour.  The mean oxygen saturation was 93.9%. The minimum SpO2 during sleep was 83.0%.  moderate snoring was noted during this study.  CARDIAC DATA The 2 lead EKG demonstrated sinus rhythm. The mean heart rate was 60.9 beats per  minute. Other EKG findings include: None.  LEG MOVEMENT DATA The total PLMS were 0 with a resulting PLMS index of 0.0. Associated arousal with leg movement index was 0.0 .  IMPRESSIONS - Moderate obstructive sleep apnea with an AHI of 15.5 and SpO2 low of 83%.  DIAGNOSIS - Obstructive Sleep Apnea (327.23 [G47.33 ICD-10])  RECOMMENDATIONS - Additional therapies include CPAP, oral appliance, or surgical assessment.  [Electronically signed] 04/27/2019 01:33 PM  Chesley Mires MD, [place], American Board of Sleep Medicine   NPI: [id number]

## 2019-05-04 ENCOUNTER — Other Ambulatory Visit: Payer: Self-pay

## 2019-05-04 ENCOUNTER — Ambulatory Visit (INDEPENDENT_AMBULATORY_CARE_PROVIDER_SITE_OTHER): Payer: PPO | Admitting: Pulmonary Disease

## 2019-05-04 ENCOUNTER — Encounter: Payer: Self-pay | Admitting: Pulmonary Disease

## 2019-05-04 VITALS — BP 124/62 | HR 69 | Temp 97.1°F | Ht 66.0 in | Wt 232.6 lb

## 2019-05-04 DIAGNOSIS — J438 Other emphysema: Secondary | ICD-10-CM

## 2019-05-04 DIAGNOSIS — J449 Chronic obstructive pulmonary disease, unspecified: Secondary | ICD-10-CM

## 2019-05-04 DIAGNOSIS — G4733 Obstructive sleep apnea (adult) (pediatric): Secondary | ICD-10-CM

## 2019-05-04 NOTE — Progress Notes (Signed)
[facility]  Chief Complaint  Patient presents with  . Results    Discuss sleep study results. Feels that he not able to get a good night sleep because of breathing issues. But feels the CPAP is helping.    Constitutional:  BP 124/62 (BP Location: Right Arm, Patient Position: Sitting, Cuff Size: Normal)   Pulse 69   Temp (!) 97.1 F (36.2 C)   Ht 5\' 6"  (1.676 m)   Wt 232 lb 9.6 oz (105.5 kg)   SpO2 100% Comment: on room air  BMI 37.54 kg/m   Past Medical History:  CVA, ETOH, OA, DM, GERD, CHF, HLD, Gout, HTN, Low T, Vit D deficiency  Brief Summary:  James Zuniga is a 63 y.o. male former smoker with obstructive sleep apnea and emphysema.  He has CPAP machine.  Doesn't feel like delivering enough pressure.  Had oxygen before at night, but taken away because he couldn't afford payments.    He ha sleep study done.  Showed moderate OSA.  Uses duoneb 4 times per day.  This helps.  He gets intermittent cough, and chest congestion.  Gets winded easily with activity.    Has trouble remembering things since he had a stroke.  Physical Exam:   Appearance - well kempt   ENMT - no sinus tenderness, no nasal discharge, no oral exudate  Neck - no masses, trachea midline, no thyromegaly, no elevation in JVP  Respiratory - normal appearance of chest wall, normal respiratory effort w/o accessory muscle use, no dullness on percussion, no wheezing or rales  CV - s1s2 regular rate and rhythm, no murmurs, no peripheral edema, radial pulses symmetric  GI - soft, non tender  Lymph - no adenopathy noted in neck and axillary areas  MSK - walks with a cane  Ext - no cyanosis, clubbing, or joint inflammation noted  Skin - no rashes, lesions, or ulcers  Neuro - normal strength, oriented x 3  Psych - normal mood and affect   Plan:   Obstructive sleep apnea. - sleep study reviewed with him - treatment options discussed - he will need in lab  CPAP titration study; will then determine if CPAP is sufficient, or if he needs Bipap +/- supplemental oxygen at night  COPD with emphysema. - he has trouble affording medications - continue prn duoneb  Chronic systolic CHF. - followed by Dr. Otho Perl with cardiology and [facility]  Dyspnea on exertion. - from CHF, COPD/emphysema, and deconditioning - encouraged him to start regular exercise regimen   Patient Instructions  Your sleep study shows moderate obstructive sleep apnea  You need to have another sleep study in the sleep lab to determine the best set up to control your sleep apnea  You should try to start a regular exercise regimen.  This will help maintain your stamina and muscle conditioning.  You should continue using your nebulizer machine every 6 hours as needed for cough, wheeze, chest congestion, or shortness of breath.  This will help manage your COPD with emphysema.  Follow up in 4 months   A total of  27 minutes were spent face to face with the patient and more than half of that time involved counseling or coordination of care.   Chesley Mires, MD [facility] Pager: [phone] 05/04/2019, 10:37 AM  Flow Sheet     Pulmonary tests:  PFT 01/27/13 >> FEV1 2.50 (84%), FEV1% 83, TLC 4.06 (61%), DLCO 60%, no BD  CT chest 03/02/13 >> mild paraseptal emphysema, 4 mm RUL nodule CT chest 07/26/15 >> stable RUL nodule  Sleep tests:  PSG 04/21/19 >> AHI 15.5, SpO2 low 83%  Cardiac tests:  Echo 12/25/16 >> EF 40 to 45%, PAS 33 mmHg, mild MR  Medications:   Allergies as of 05/04/2019   No Known Allergies     Medication List       Accurate as of May 04, 2019 10:37 AM. If you have any questions, ask your nurse or doctor.        albuterol (2.5 MG/3ML) 0.083% nebulizer solution Commonly known as: PROVENTIL Take 2.5 mg by nebulization every 6 (six) hours as needed for wheezing or shortness of breath.   allopurinol 300 MG tablet  Commonly known as: ZYLOPRIM Take 300 mg by mouth daily.   amLODipine 5 MG tablet Commonly known as: NORVASC Take 5 mg by mouth daily.   aspirin 81 MG EC tablet Take 81 mg by mouth daily. Swallow whole.   atorvastatin 80 MG tablet Commonly known as: LIPITOR Take 1 tablet (80 mg total) by mouth every morning.   clopidogrel 75 MG tablet Commonly known as: PLAVIX Take 1 tablet (75 mg total) by mouth daily.   diclofenac sodium 1 % Gel Commonly known as: VOLTAREN Use 4 grams to knees tid prn   DULoxetine 60 MG capsule Commonly known as: CYMBALTA Take 60 mg by mouth 2 (two) times daily.   furosemide 20 MG tablet Commonly known as: LASIX Take 20 mg by mouth every Tuesday, Thursday, and Saturday at 6 PM.   gabapentin 300 MG capsule Commonly known as: NEURONTIN Take 300 mg by mouth 4 (four) times daily.   insulin NPH-regular Human (70-30) 100 UNIT/ML injection Commonly known as: NovoLIN 70/30 Please inject 25 units twice daily before breakfast and dinner. What changed:   how much to take  how to take this  when to take this  additional instructions   ipratropium-albuterol 0.5-2.5 (3) MG/3ML Soln Commonly known as: DUONEB USE 1 VIAL IN NEBULIZER 4 TIMES DAILY   losartan 100 MG tablet Commonly known as: COZAAR Take 100 mg by mouth daily.   metoprolol succinate 25 MG 24 hr tablet Commonly known as: TOPROL-XL Take 1 tablet (25 mg total) by mouth daily. What changed:   how much to take  when to take this   mupirocin ointment 2 % Commonly known as: BACTROBAN APPLY OINTMENT TOPICALLY TO AFFECTED AREA(S) TWICE DAILY AS NEEDED   nystatin cream Commonly known as: MYCOSTATIN Use bid to affected area x 10 days, then prn   onetouch ultrasoft lancets USE 1 LANCET TO CHECK GLUCOSE IN THE MORNING BEFORE BREAKFAST   pantoprazole 20 MG tablet Commonly known as: PROTONIX Take 20 mg by mouth daily.   potassium chloride SA 20 MEQ tablet Commonly known as: KLOR-CON  Take 1 tablet (20 mEq total) by mouth 2 (two) times daily. What changed:   when to take this  additional instructions   Precision QID Test test strip Generic drug: glucose blood 1 each by Misc.(Non-Drug; Combo Route) route daily.   primidone 50 MG tablet Commonly known as: MYSOLINE Take 100 mg by mouth at bedtime.   sildenafil 100 MG tablet Commonly known as: VIAGRA Take by mouth.   tamsulosin 0.4 MG Caps capsule Commonly known as: FLOMAX Take 1 capsule (0.4 mg total) by mouth daily after breakfast.   tiZANidine 4 MG tablet Commonly known as: ZANAFLEX Take 4 mg by mouth 3 (three) times daily  as needed for muscle spasms.   traMADol 50 MG tablet Commonly known as: ULTRAM Take 50 mg by mouth daily as needed for moderate pain.   Tyler Aas FlexTouch 200 UNIT/ML Sopn Generic drug: Insulin Degludec 60 units once daily per patient.   VITAMIN B-12 IJ Inject 1,000 mg as directed every 30 (thirty) days. X 4   zolpidem 10 MG tablet Commonly known as: AMBIEN Take 10 mg by mouth at bedtime.       Past Surgical History:  He  has a past surgical history that includes Knee arthroscopy (Left); TEE without cardioversion (N/A, 12/24/2016); Loop recorder implant (06/2017); Esophagogastroduodenoscopy (N/A, 08/26/2017); Colonoscopy (N/A, 08/26/2017); enteroscopy (N/A, 05/24/2018); Hot hemostasis (N/A, 05/24/2018); and polypectomy (05/24/2018).  Family History:  His family history includes Diabetes in his father and sister; Heart attack in his brother and brother; Heart disease in his mother; Stroke in his brother and paternal uncle.  Social History:  He  reports that he quit smoking about 20 months ago. His smoking use included cigarettes. He started smoking about 35 years ago. He has a 20.00 pack-year smoking history. He has never used smokeless tobacco. He reports that he does not drink alcohol or use drugs.

## 2019-05-04 NOTE — Patient Instructions (Addendum)
Your sleep study shows moderate obstructive sleep apnea  You need to have another sleep study in the sleep lab to determine the best set up to control your sleep apnea  You should try to start a regular exercise regimen.  This will help maintain your stamina and muscle conditioning.  You should continue using your nebulizer machine every 6 hours as needed for cough, wheeze, chest congestion, or shortness of breath.  This will help manage your COPD with emphysema.  Follow up in 4 months

## 2019-05-08 ENCOUNTER — Inpatient Hospital Stay (HOSPITAL_BASED_OUTPATIENT_CLINIC_OR_DEPARTMENT_OTHER): Payer: PPO | Admitting: Family

## 2019-05-08 ENCOUNTER — Inpatient Hospital Stay: Payer: PPO

## 2019-05-08 ENCOUNTER — Telehealth: Payer: Self-pay | Admitting: *Deleted

## 2019-05-08 ENCOUNTER — Encounter: Payer: Self-pay | Admitting: Family

## 2019-05-08 ENCOUNTER — Other Ambulatory Visit: Payer: Self-pay

## 2019-05-08 ENCOUNTER — Telehealth: Payer: Self-pay | Admitting: Family

## 2019-05-08 ENCOUNTER — Inpatient Hospital Stay: Payer: PPO | Attending: Family

## 2019-05-08 VITALS — BP 140/88 | HR 86 | Temp 97.8°F | Resp 18 | Ht 66.0 in | Wt 229.4 lb

## 2019-05-08 DIAGNOSIS — D510 Vitamin B12 deficiency anemia due to intrinsic factor deficiency: Secondary | ICD-10-CM | POA: Diagnosis not present

## 2019-05-08 DIAGNOSIS — D631 Anemia in chronic kidney disease: Secondary | ICD-10-CM | POA: Diagnosis not present

## 2019-05-08 DIAGNOSIS — D509 Iron deficiency anemia, unspecified: Secondary | ICD-10-CM

## 2019-05-08 DIAGNOSIS — D500 Iron deficiency anemia secondary to blood loss (chronic): Secondary | ICD-10-CM

## 2019-05-08 DIAGNOSIS — J449 Chronic obstructive pulmonary disease, unspecified: Secondary | ICD-10-CM | POA: Diagnosis not present

## 2019-05-08 LAB — CBC WITH DIFFERENTIAL (CANCER CENTER ONLY)
Abs Immature Granulocytes: 0.02 10*3/uL (ref 0.00–0.07)
Basophils Absolute: 0 10*3/uL (ref 0.0–0.1)
Basophils Relative: 1 %
Eosinophils Absolute: 0.1 10*3/uL (ref 0.0–0.5)
Eosinophils Relative: 3 %
HCT: 40 % (ref 39.0–52.0)
Hemoglobin: 12.2 g/dL — ABNORMAL LOW (ref 13.0–17.0)
Immature Granulocytes: 0 %
Lymphocytes Relative: 27 %
Lymphs Abs: 1.5 10*3/uL (ref 0.7–4.0)
MCH: 26.6 pg (ref 26.0–34.0)
MCHC: 30.5 g/dL (ref 30.0–36.0)
MCV: 87.3 fL (ref 80.0–100.0)
Monocytes Absolute: 0.3 10*3/uL (ref 0.1–1.0)
Monocytes Relative: 6 %
Neutro Abs: 3.4 10*3/uL (ref 1.7–7.7)
Neutrophils Relative %: 63 %
Platelet Count: 334 10*3/uL (ref 150–400)
RBC: 4.58 MIL/uL (ref 4.22–5.81)
RDW: 14.2 % (ref 11.5–15.5)
WBC Count: 5.4 10*3/uL (ref 4.0–10.5)
nRBC: 0 % (ref 0.0–0.2)

## 2019-05-08 LAB — CMP (CANCER CENTER ONLY)
ALT: 21 U/L (ref 0–44)
AST: 14 U/L — ABNORMAL LOW (ref 15–41)
Albumin: 4.5 g/dL (ref 3.5–5.0)
Alkaline Phosphatase: 159 U/L — ABNORMAL HIGH (ref 38–126)
Anion gap: 10 (ref 5–15)
BUN: 9 mg/dL (ref 8–23)
CO2: 30 mmol/L (ref 22–32)
Calcium: 10.1 mg/dL (ref 8.9–10.3)
Chloride: 98 mmol/L (ref 98–111)
Creatinine: 1.12 mg/dL (ref 0.61–1.24)
GFR, Est AFR Am: >60 mL/min (ref >60)
GFR, Estimated: >60 mL/min (ref >60)
Glucose, Bld: 436 mg/dL — ABNORMAL HIGH (ref 70–99)
Potassium: 2.9 mmol/L — CL (ref 3.5–5.1)
Sodium: 138 mmol/L (ref 135–145)
Total Bilirubin: 0.3 mg/dL (ref 0.3–1.2)
Total Protein: 7.9 g/dL (ref 6.5–8.1)

## 2019-05-08 LAB — VITAMIN B12: Vitamin B-12: 372 pg/mL (ref 180–914)

## 2019-05-08 MED ORDER — CYANOCOBALAMIN 1000 MCG/ML IJ SOLN
1000.0000 ug | Freq: Once | INTRAMUSCULAR | Status: AC
  Administered 2019-05-08: 1000 ug via INTRAMUSCULAR

## 2019-05-08 MED ORDER — CYANOCOBALAMIN 1000 MCG/ML IJ SOLN
INTRAMUSCULAR | Status: AC
  Filled 2019-05-08: qty 1

## 2019-05-08 NOTE — Telephone Encounter (Signed)
Jory Ee NP notified of K-2.9 and glucose-436.  No new orders received at this time.

## 2019-05-08 NOTE — Telephone Encounter (Signed)
Called and spoke with patient regarding appointments added per 10/26 los

## 2019-05-08 NOTE — Progress Notes (Signed)
Hematology and Oncology Follow Up Visit  James Zuniga [id number] [birth year] 63 y.o. 05/08/2019   Principle Diagnosis:  Iron deficiency anemia Hypotestosteronemia Chronic liver lesions Pernicious anemia  Of Note: History of stroke in June 2018, no more Testosterone or ESA's  Current Therapy:   IV iron as indicated -- dose given on 11/16/2018 Vit B12 1 mg IM q month   Interim History:  James Zuniga is here today for follow-up. He is doing well but has had some fatigue.  Hgb is stable at 12.2, MCV 87%, platelets 334.  No episodes of bleeding. No bruising or petechiae.  He has SOB with over exertion and rests when needed.  No fever, chills, n/v, cough, rash, dizziness, SOB, chest pain, abdominal pain or changes in bowel or bladder habits.  He has occasional heart palpitations.  Patient asymptomatic at this time with blood sugar 436 and potassium 2.9. He has no complaints. He states that he administered his insulin prior to coming in for his visit as well as his KDUR as prescribed. I have forwarded his lab work to his PCP for further review and medication changes where appropriate.   He states that he is having a sleep study on Wednesday.  He has the numbness and tingling in the right side which was effected by the stroke.  He has puffiness in the feet and ankles that waxes and wanes.  He is eating well and staying hydrated. His weight is stable.   ECOG Performance Status: 1 - Symptomatic but completely ambulatory  Medications:  Allergies as of 05/08/2019   No Known Allergies     Medication List       Accurate as of May 08, 2019 11:23 AM. If you have any questions, ask your nurse or doctor.        albuterol (2.5 MG/3ML) 0.083% nebulizer solution Commonly known as: PROVENTIL Take 2.5 mg by nebulization every 6 (six) hours as needed for wheezing or shortness of breath.   allopurinol 300 MG tablet Commonly known as: ZYLOPRIM Take 300 mg by mouth daily.   amLODipine 5  MG tablet Commonly known as: NORVASC Take 5 mg by mouth daily.   aspirin 81 MG EC tablet Take 81 mg by mouth daily. Swallow whole.   atorvastatin 80 MG tablet Commonly known as: LIPITOR Take 1 tablet (80 mg total) by mouth every morning.   clopidogrel 75 MG tablet Commonly known as: PLAVIX Take 1 tablet (75 mg total) by mouth daily.   diclofenac sodium 1 % Gel Commonly known as: VOLTAREN Use 4 grams to knees tid prn   DULoxetine 60 MG capsule Commonly known as: CYMBALTA Take 60 mg by mouth 2 (two) times daily.   Fifty50 Glucose Meter 2.0 w/Device Kit To check blood sugars TID or Use as instructed Include strips. Lancets, lancet device, control solution. batteries   Fifty50 Pen Needles 32G X 4 MM Misc Generic drug: Insulin Pen Needle 1 each by Misc.(Non-Drug; Combo Route) route daily.   furosemide 20 MG tablet Commonly known as: LASIX Take 20 mg by mouth every Tuesday, Thursday, and Saturday at 6 PM.   gabapentin 300 MG capsule Commonly known as: NEURONTIN Take 300 mg by mouth 4 (four) times daily.   insulin NPH-regular Human (70-30) 100 UNIT/ML injection Commonly known as: NovoLIN 70/30 Please inject 25 units twice daily before breakfast and dinner. What changed:   how much to take  how to take this  when to take this  additional instructions   ipratropium-albuterol  0.5-2.5 (3) MG/3ML Soln Commonly known as: DUONEB USE 1 VIAL IN NEBULIZER 4 TIMES DAILY   losartan 100 MG tablet Commonly known as: COZAAR Take 100 mg by mouth daily.   metoprolol succinate 25 MG 24 hr tablet Commonly known as: TOPROL-XL Take 1 tablet (25 mg total) by mouth daily. What changed:   how much to take  when to take this   mupirocin ointment 2 % Commonly known as: BACTROBAN APPLY OINTMENT TOPICALLY TO AFFECTED AREA(S) TWICE DAILY AS NEEDED   nystatin cream Commonly known as: MYCOSTATIN Use bid to affected area x 10 days, then prn   onetouch ultrasoft lancets USE 1  LANCET TO CHECK GLUCOSE IN THE MORNING BEFORE BREAKFAST   pantoprazole 20 MG tablet Commonly known as: PROTONIX Take 20 mg by mouth daily.   potassium chloride SA 20 MEQ tablet Commonly known as: KLOR-CON Take 1 tablet (20 mEq total) by mouth 2 (two) times daily. What changed:   when to take this  additional instructions   Precision QID Test test strip Generic drug: glucose blood 1 each by Misc.(Non-Drug; Combo Route) route daily.   primidone 50 MG tablet Commonly known as: MYSOLINE Take 100 mg by mouth at bedtime.   sildenafil 100 MG tablet Commonly known as: VIAGRA Take by mouth.   tamsulosin 0.4 MG Caps capsule Commonly known as: FLOMAX Take 1 capsule (0.4 mg total) by mouth daily after breakfast.   tiZANidine 4 MG tablet Commonly known as: ZANAFLEX Take 4 mg by mouth 3 (three) times daily as needed for muscle spasms.   traMADol 50 MG tablet Commonly known as: ULTRAM Take 50 mg by mouth daily as needed for moderate pain.   Tyler Aas FlexTouch 200 UNIT/ML Sopn Generic drug: Insulin Degludec 60 units once daily per patient.   trimethoprim-polymyxin b ophthalmic solution Commonly known as: POLYTRIM INSTILL 1 DROP INTO LEFT EYE 4 TIMES DAILY FOR 5 DAYS   VITAMIN B-12 IJ Inject 1,000 mg as directed every 30 (thirty) days. X 4   zolpidem 10 MG tablet Commonly known as: AMBIEN Take 10 mg by mouth at bedtime.       Allergies: No Known Allergies  Past Medical History, Surgical history, Social history, and Family History were reviewed and updated.  Review of Systems: All other 10 point review of systems is negative.   Physical Exam:  height is _0  (1.676 m) and weight is 229 lb 6.4 oz (104.1 kg). His temporal temperature is 97.8 F (36.6 C). His blood pressure is 140/88 and his pulse is 86. His respiration is 18 and oxygen saturation is 96%.   Wt Readings from Last 3 Encounters:  05/08/19 229 lb 6.4 oz (104.1 kg)  05/04/19 232 lb 9.6 oz (105.5 kg)   04/21/19 225 lb (102.1 kg)    Ocular: Sclerae unicteric, pupils equal, round and reactive to light Ear-nose-throat: Oropharynx clear, dentition fair Lymphatic: No cervical or supraclavicular adenopathy Lungs no rales or rhonchi, good excursion bilaterally Heart regular rate and rhythm, no murmur appreciated Abd soft, nontender, positive bowel sounds, no liver or spleen tip palpated on exam, no fluid wave  MSK no focal spinal tenderness, no joint edema Neuro: non-focal, well-oriented, appropriate affect Breasts: Deferred   Lab Results  Component Value Date   WBC 5.4 05/08/2019   HGB 12.2 (L) 05/08/2019   HCT 40.0 05/08/2019   MCV 87.3 05/08/2019   PLT 334 05/08/2019   Lab Results  Component Value Date   FERRITIN 187 03/27/2019   IRON  59 03/27/2019   TIBC 276 03/27/2019   UIBC 217 03/27/2019   IRONPCTSAT 21 03/27/2019   Lab Results  Component Value Date   RETICCTPCT 1.5 12/19/2018   RBC 4.58 05/08/2019   RETICCTABS 63.0 06/20/2015   No results found for: Nils P[facility] Lab Results  Component Value Date   IGA 139 08/26/2017   No results found for: Odetta Pink, SPEI   Chemistry      Component Value Date/Time   NA 140 03/27/2019 1033   NA 141 09/25/2016 0809   K 3.3 (L) 03/27/2019 1033   K 2.8 (LL) 09/25/2016 0809   CL 99 03/27/2019 1033   CL 102 07/31/2016 0815   CO2 32 03/27/2019 1033   CO2 28 09/25/2016 0809   BUN 9 03/27/2019 1033   BUN 13.1 09/25/2016 0809   CREATININE 0.93 03/27/2019 1033   CREATININE 1.2 09/25/2016 0809      Component Value Date/Time   CALCIUM 9.5 03/27/2019 1033   CALCIUM 9.5 09/25/2016 0809   ALKPHOS 143 (H) 03/27/2019 1033   ALKPHOS 126 09/25/2016 0809   AST 17 03/27/2019 1033   AST 19 09/25/2016 0809   ALT 25 03/27/2019 1033   ALT 23 09/25/2016 0809   BILITOT 0.4 03/27/2019 1033   BILITOT 0.45 09/25/2016 0809       Impression and Plan: Mr. Headley  is a very pleasant 63 yo African American gentleman with multifactorial anemia.  He received his B 12 injection today.  We will bring him back in for IV iron if needed.  I did forward today's labs to his PCP for review.  We will go ahead and plan to see him back in another 6 weeks.  He will contact our office with any questions or concerns. We can certainly see him sooner if needed.   Laverna Peace, NP 10/26/202011:23 AM

## 2019-05-09 LAB — FERRITIN: Ferritin: 66 ng/mL (ref 24–336)

## 2019-05-09 LAB — IRON AND TIBC
Iron: 36 ug/dL — ABNORMAL LOW (ref 42–163)
Saturation Ratios: 11 % — ABNORMAL LOW (ref 20–55)
TIBC: 316 ug/dL (ref 202–409)
UIBC: 281 ug/dL (ref 117–376)

## 2019-05-15 ENCOUNTER — Other Ambulatory Visit (HOSPITAL_COMMUNITY)
Admission: RE | Admit: 2019-05-15 | Discharge: 2019-05-15 | Disposition: A | Discharge status: Home / Self Care | Payer: PPO | Source: Ambulatory Visit | Attending: Pulmonary Disease | Admitting: Pulmonary Disease

## 2019-05-15 ENCOUNTER — Ambulatory Visit: Payer: PPO

## 2019-05-15 DIAGNOSIS — Z01812 Encounter for preprocedural laboratory examination: Secondary | ICD-10-CM | POA: Insufficient documentation

## 2019-05-15 DIAGNOSIS — Z20828 Contact with and (suspected) exposure to other viral communicable diseases: Secondary | ICD-10-CM | POA: Diagnosis not present

## 2019-05-15 LAB — SARS CORONAVIRUS 2 (TAT 6-24 HRS): SARS Coronavirus 2: NEGATIVE

## 2019-05-16 ENCOUNTER — Inpatient Hospital Stay: Payer: PPO | Attending: Family

## 2019-05-16 ENCOUNTER — Other Ambulatory Visit: Payer: Self-pay

## 2019-05-16 VITALS — BP 139/67 | HR 77 | Temp 97.1°F | Resp 20

## 2019-05-16 DIAGNOSIS — D509 Iron deficiency anemia, unspecified: Secondary | ICD-10-CM | POA: Insufficient documentation

## 2019-05-16 MED ORDER — SODIUM CHLORIDE 0.9 % IV SOLN
510.0000 mg | Freq: Once | INTRAVENOUS | Status: AC
  Administered 2019-05-16: 510 mg via INTRAVENOUS
  Filled 2019-05-16: qty 17

## 2019-05-16 MED ORDER — SODIUM CHLORIDE 0.9 % IV SOLN
INTRAVENOUS | Status: DC
  Administered 2019-05-16: 13:00:00 via INTRAVENOUS
  Filled 2019-05-16: qty 250

## 2019-05-16 NOTE — Patient Instructions (Signed)

## 2019-05-17 ENCOUNTER — Other Ambulatory Visit: Payer: Self-pay

## 2019-05-17 ENCOUNTER — Ambulatory Visit (HOSPITAL_BASED_OUTPATIENT_CLINIC_OR_DEPARTMENT_OTHER): Payer: PPO | Attending: Pulmonary Disease | Admitting: Pulmonary Disease

## 2019-05-17 DIAGNOSIS — G4733 Obstructive sleep apnea (adult) (pediatric): Secondary | ICD-10-CM | POA: Insufficient documentation

## 2019-05-17 DIAGNOSIS — J449 Chronic obstructive pulmonary disease, unspecified: Secondary | ICD-10-CM | POA: Diagnosis not present

## 2019-05-17 DIAGNOSIS — I5022 Chronic systolic (congestive) heart failure: Secondary | ICD-10-CM | POA: Insufficient documentation

## 2019-05-19 DIAGNOSIS — M545 Low back pain: Secondary | ICD-10-CM | POA: Diagnosis not present

## 2019-05-19 DIAGNOSIS — E7800 Pure hypercholesterolemia, unspecified: Secondary | ICD-10-CM | POA: Diagnosis not present

## 2019-05-19 DIAGNOSIS — G8929 Other chronic pain: Secondary | ICD-10-CM | POA: Diagnosis not present

## 2019-05-19 DIAGNOSIS — E119 Type 2 diabetes mellitus without complications: Secondary | ICD-10-CM | POA: Diagnosis not present

## 2019-05-19 DIAGNOSIS — I10 Essential (primary) hypertension: Secondary | ICD-10-CM | POA: Diagnosis not present

## 2019-05-19 DIAGNOSIS — E559 Vitamin D deficiency, unspecified: Secondary | ICD-10-CM | POA: Diagnosis not present

## 2019-05-19 DIAGNOSIS — E118 Type 2 diabetes mellitus with unspecified complications: Secondary | ICD-10-CM | POA: Diagnosis not present

## 2019-05-22 ENCOUNTER — Inpatient Hospital Stay: Payer: PPO

## 2019-05-22 ENCOUNTER — Other Ambulatory Visit: Payer: Self-pay

## 2019-05-22 ENCOUNTER — Telehealth: Payer: Self-pay | Admitting: Pulmonary Disease

## 2019-05-22 VITALS — BP 133/94 | HR 80 | Temp 98.0°F | Resp 18

## 2019-05-22 DIAGNOSIS — G4733 Obstructive sleep apnea (adult) (pediatric): Secondary | ICD-10-CM | POA: Diagnosis not present

## 2019-05-22 DIAGNOSIS — J449 Chronic obstructive pulmonary disease, unspecified: Secondary | ICD-10-CM | POA: Diagnosis not present

## 2019-05-22 DIAGNOSIS — D509 Iron deficiency anemia, unspecified: Secondary | ICD-10-CM | POA: Diagnosis not present

## 2019-05-22 MED ORDER — SODIUM CHLORIDE 0.9 % IV SOLN
510.0000 mg | Freq: Once | INTRAVENOUS | Status: AC
  Administered 2019-05-22: 510 mg via INTRAVENOUS
  Filled 2019-05-22: qty 510

## 2019-05-22 MED ORDER — SODIUM CHLORIDE 0.9 % IV SOLN
Freq: Once | INTRAVENOUS | Status: AC
  Administered 2019-05-22: 11:00:00 via INTRAVENOUS
  Filled 2019-05-22: qty 250

## 2019-05-22 NOTE — Telephone Encounter (Signed)
CPAP titration 05/17/19 >> CPAP 11 cm H2O.   Please let him know he did well with CPAP during sleep study.  Please arrange for CPAP 11 cm H2O with heated humidity and mask of choice.  Please arrange for ROV in 2 months after getting CPAP.

## 2019-05-22 NOTE — Procedures (Signed)
    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 He presents for a CPAP titration study.  SLEEP STUDY TECHNIQUE As per the AASM Manual for the Scoring of Sleep and Associated Events v2.3 (April 2016) with a hypopnea requiring 4% desaturations.  The channels recorded and monitored were frontal, central and occipital EEG, electrooculogram (EOG), submentalis EMG (chin), nasal and oral airflow, thoracic and abdominal wall motion, anterior tibialis EMG, snore microphone, electrocardiogram, and pulse oximetry. Continuous positive airway pressure (CPAP) was initiated at the beginning of the study and titrated to treat sleep-disordered breathing.  MEDICATIONS Medications self-administered by patient taken the night of the study : ULTRAM, ZANAFLEX, ZOLPIDEM TARTRATE  TECHNICIAN COMMENTS Comments added by technician: NO RESTROOM VISTED Comments added by scorer: N/A  RESPIRATORY PARAMETERS Optimal PAP Pressure (cm): 11 AHI at Optimal Pressure (/hr): 2.1 Overall Minimal O2 (%): 85.0 Supine % at Optimal Pressure (%): 44 Minimal O2 at Optimal Pressure (%): 91.0   SLEEP ARCHITECTURE The study was initiated at 10:02:41 PM and ended at 5:06:23 AM.  Sleep onset time was 8.8 minutes and the sleep efficiency was 95.3%%. The total sleep time was 403.9 minutes.  The patient spent 2.4%% of the night in stage N1 sleep, 76.7%% in stage N2 sleep, 1.9%% in stage N3 and 19.1% in REM.Stage REM latency was 182.5 minutes  Wake after sleep onset was 11.0. Alpha  intrusion was absent. Supine sleep was 26.34%.  CARDIAC DATA The 2 lead EKG demonstrated sinus rhythm. The mean heart rate was 60.0 beats per minute. Other EKG findings include: None.  LEG MOVEMENT DATA The total Periodic Limb Movements of Sleep (PLMS) were 0. The PLMS index was 0.0. A PLMS index of <15 is considered normal in adults.  IMPRESSIONS - He did well with CPAP 11 cm H2O.  Noted to have central apneas with higher pressure settings.  He did not require supplemental oxygen during this study.  DIAGNOSIS - Obstructive Sleep Apnea (327.23 [G47.33 ICD-10])  RECOMMENDATIONS - Trial of CPAP therapy on 11 cm H2O with a Medium size Resmed Full Face Mask AirFit F10 mask and heated humidification.  [Electronically signed] 05/22/2019 08:53 AM  Chesley Mires MD, ABSM Diplomate, American Board of Sleep Medicine   NPI: [id number]

## 2019-05-22 NOTE — Patient Instructions (Signed)

## 2019-05-23 NOTE — Telephone Encounter (Signed)
Called the patient and made him aware of the test results. Patient voiced understanding. Was in his car at the time of the call and stated he would have to check his schedule once he got home, he has multiple doctor appointments in December.   I told the patient when he calls back to ask for 07/05/19, Dr. Halford Chessman is in the office that day (no January schedule has been made available yet). Patient agreed and will return call.  Confirmed DME company, order placed with notation the patient will need the machine asap so to have 30 days of use before appointment on 07/05/19.

## 2019-06-01 DIAGNOSIS — J449 Chronic obstructive pulmonary disease, unspecified: Secondary | ICD-10-CM | POA: Diagnosis not present

## 2019-06-21 ENCOUNTER — Inpatient Hospital Stay: Payer: PPO

## 2019-06-21 ENCOUNTER — Inpatient Hospital Stay: Payer: PPO | Admitting: Hematology & Oncology

## 2019-06-26 DIAGNOSIS — J449 Chronic obstructive pulmonary disease, unspecified: Secondary | ICD-10-CM | POA: Diagnosis not present

## 2019-07-02 DIAGNOSIS — I639 Cerebral infarction, unspecified: Secondary | ICD-10-CM | POA: Diagnosis not present

## 2019-07-03 DIAGNOSIS — I639 Cerebral infarction, unspecified: Secondary | ICD-10-CM | POA: Diagnosis not present

## 2019-07-03 DIAGNOSIS — M722 Plantar fascial fibromatosis: Secondary | ICD-10-CM | POA: Diagnosis not present

## 2019-07-24 ENCOUNTER — Inpatient Hospital Stay: Payer: Medicare Other | Attending: Family

## 2019-07-24 ENCOUNTER — Encounter: Payer: Self-pay | Admitting: Family

## 2019-07-24 ENCOUNTER — Telehealth: Payer: Self-pay | Admitting: Family

## 2019-07-24 ENCOUNTER — Inpatient Hospital Stay: Payer: Medicare Other

## 2019-07-24 ENCOUNTER — Inpatient Hospital Stay (HOSPITAL_BASED_OUTPATIENT_CLINIC_OR_DEPARTMENT_OTHER): Payer: Medicare Other | Admitting: Family

## 2019-07-24 ENCOUNTER — Other Ambulatory Visit: Payer: Self-pay

## 2019-07-24 VITALS — BP 131/79 | HR 78 | Temp 96.9°F | Resp 18 | Ht 66.0 in | Wt 231.0 lb

## 2019-07-24 DIAGNOSIS — D500 Iron deficiency anemia secondary to blood loss (chronic): Secondary | ICD-10-CM | POA: Diagnosis not present

## 2019-07-24 DIAGNOSIS — D510 Vitamin B12 deficiency anemia due to intrinsic factor deficiency: Secondary | ICD-10-CM | POA: Diagnosis not present

## 2019-07-24 DIAGNOSIS — D509 Iron deficiency anemia, unspecified: Secondary | ICD-10-CM | POA: Insufficient documentation

## 2019-07-24 LAB — VITAMIN B12: Vitamin B-12: 996 pg/mL — ABNORMAL HIGH (ref 180–914)

## 2019-07-24 LAB — CBC WITH DIFFERENTIAL (CANCER CENTER ONLY)
Abs Immature Granulocytes: 0.01 10*3/uL (ref 0.00–0.07)
Basophils Absolute: 0 10*3/uL (ref 0.0–0.1)
Basophils Relative: 1 %
Eosinophils Absolute: 0.2 10*3/uL (ref 0.0–0.5)
Eosinophils Relative: 3 %
HCT: 38.2 % — ABNORMAL LOW (ref 39.0–52.0)
Hemoglobin: 11.8 g/dL — ABNORMAL LOW (ref 13.0–17.0)
Immature Granulocytes: 0 %
Lymphocytes Relative: 22 %
Lymphs Abs: 1.3 10*3/uL (ref 0.7–4.0)
MCH: 26.3 pg (ref 26.0–34.0)
MCHC: 30.9 g/dL (ref 30.0–36.0)
MCV: 85.3 fL (ref 80.0–100.0)
Monocytes Absolute: 0.2 10*3/uL (ref 0.1–1.0)
Monocytes Relative: 4 %
Neutro Abs: 4.1 10*3/uL (ref 1.7–7.7)
Neutrophils Relative %: 70 %
Platelet Count: 425 10*3/uL — ABNORMAL HIGH (ref 150–400)
RBC: 4.48 MIL/uL (ref 4.22–5.81)
RDW: 14.1 % (ref 11.5–15.5)
WBC Count: 5.8 10*3/uL (ref 4.0–10.5)
nRBC: 0 % (ref 0.0–0.2)

## 2019-07-24 LAB — CMP (CANCER CENTER ONLY)
ALT: 18 U/L (ref 0–44)
AST: 13 U/L — ABNORMAL LOW (ref 15–41)
Albumin: 3.9 g/dL (ref 3.5–5.0)
Alkaline Phosphatase: 144 U/L — ABNORMAL HIGH (ref 38–126)
Anion gap: 10 (ref 5–15)
BUN: 6 mg/dL — ABNORMAL LOW (ref 8–23)
CO2: 26 mmol/L (ref 22–32)
Calcium: 9.7 mg/dL (ref 8.9–10.3)
Chloride: 100 mmol/L (ref 98–111)
Creatinine: 1.1 mg/dL (ref 0.61–1.24)
GFR, Est AFR Am: >60 mL/min (ref >60)
GFR, Estimated: >60 mL/min (ref >60)
Glucose, Bld: 366 mg/dL — ABNORMAL HIGH (ref 70–99)
Potassium: 3.2 mmol/L — ABNORMAL LOW (ref 3.5–5.1)
Sodium: 136 mmol/L (ref 135–145)
Total Bilirubin: 0.3 mg/dL (ref 0.3–1.2)
Total Protein: 7.5 g/dL (ref 6.5–8.1)

## 2019-07-24 MED ORDER — CYANOCOBALAMIN 1000 MCG/ML IJ SOLN
INTRAMUSCULAR | Status: AC
  Filled 2019-07-24: qty 1

## 2019-07-24 MED ORDER — CYANOCOBALAMIN 1000 MCG/ML IJ SOLN
1000.0000 ug | Freq: Once | INTRAMUSCULAR | Status: AC
  Administered 2019-07-24: 1000 ug via INTRAMUSCULAR

## 2019-07-24 NOTE — Telephone Encounter (Signed)
Appointments scheduled calendar printed per 1/11 los 

## 2019-07-24 NOTE — Progress Notes (Signed)
Hematology and Oncology Follow Up Visit  James Zuniga [id number] [birth year] 64 y.o. 07/24/2019   Principle Diagnosis:  Iron deficiency anemia Hypotestosteronemia Chronic liver lesions Pernicious anemia  Of Note: History of stroke in June 2018, no more Testosterone or ESA's  Current Therapy:   IV iron as indicated  Vit B12 1 mg IM q month   Interim History:  James Zuniga is here today for follow-up. He is doing well but does have fatigue and SOB.  His diabetes is still poorly controlled and he states that he is on a new insulin Environmental health practitioner). BG at this time is 366. He states that he is due again when he gets home and will administer then.  He has had no fever, chills, n/v, cough, rash, chest pain, palpitations, abdominal pain or changes in bowel or bladder habits.  He has occasional episodes of dizziness.  No bleeding, bruising or petechiae.  The swelling in his right lower extremity is unchanged. He has right sided weakness since his CVA.  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  No falls or syncopal episodes to report. He is ambulating with a cane for added support.  He states that he eats well and is staying properly hydrated. His weight is stable.   ECOG Performance Status: 1 - Symptomatic but completely ambulatory  Medications:  Allergies as of 07/24/2019   No Known Allergies     Medication List       Accurate as of July 24, 2019  2:42 PM. If you have any questions, ask your nurse or doctor.        albuterol (2.5 MG/3ML) 0.083% nebulizer solution Commonly known as: PROVENTIL Take 2.5 mg by nebulization every 6 (six) hours as needed for wheezing or shortness of breath.   allopurinol 300 MG tablet Commonly known as: ZYLOPRIM Take 300 mg by mouth daily.   amLODipine 5 MG tablet Commonly known as: NORVASC Take 5 mg by mouth daily.   aspirin 81 MG EC tablet Take 81 mg by mouth daily. Swallow whole.   atorvastatin 80 MG tablet Commonly known as:  LIPITOR Take 1 tablet (80 mg total) by mouth every morning.   clopidogrel 75 MG tablet Commonly known as: PLAVIX Take 1 tablet (75 mg total) by mouth daily.   diclofenac sodium 1 % Gel Commonly known as: VOLTAREN Use 4 grams to knees tid prn   DULoxetine 60 MG capsule Commonly known as: CYMBALTA Take 60 mg by mouth 2 (two) times daily.   Fifty50 Glucose Meter 2.0 w/Device Kit To check blood sugars TID or Use as instructed Include strips. Lancets, lancet device, control solution. batteries   Fifty50 Pen Needles 32G X 4 MM Misc Generic drug: Insulin Pen Needle 1 each by Misc.(Non-Drug; Combo Route) route daily.   furosemide 20 MG tablet Commonly known as: LASIX Take 20 mg by mouth every Tuesday, Thursday, and Saturday at 6 PM.   gabapentin 300 MG capsule Commonly known as: NEURONTIN Take 300 mg by mouth 4 (four) times daily.   insulin NPH-regular Human (70-30) 100 UNIT/ML injection Commonly known as: NovoLIN 70/30 Please inject 25 units twice daily before breakfast and dinner. What changed:   how much to take  how to take this  when to take this  additional instructions   ipratropium-albuterol 0.5-2.5 (3) MG/3ML Soln Commonly known as: DUONEB USE 1 VIAL IN NEBULIZER 4 TIMES DAILY   losartan 100 MG tablet Commonly known as: COZAAR Take 100 mg by mouth daily.  metoprolol succinate 25 MG 24 hr tablet Commonly known as: TOPROL-XL Take 1 tablet (25 mg total) by mouth daily. What changed:   how much to take  when to take this   mupirocin ointment 2 % Commonly known as: BACTROBAN APPLY OINTMENT TOPICALLY TO AFFECTED AREA(S) TWICE DAILY AS NEEDED   nystatin cream Commonly known as: MYCOSTATIN Use bid to affected area x 10 days, then prn   onetouch ultrasoft lancets USE 1 LANCET TO CHECK GLUCOSE IN THE MORNING BEFORE BREAKFAST   pantoprazole 20 MG tablet Commonly known as: PROTONIX Take 20 mg by mouth daily.   potassium chloride SA 20 MEQ  tablet Commonly known as: KLOR-CON Take 1 tablet (20 mEq total) by mouth 2 (two) times daily. What changed:   when to take this  additional instructions   Precision QID Test test strip Generic drug: glucose blood 1 each by Misc.(Non-Drug; Combo Route) route daily.   primidone 50 MG tablet Commonly known as: MYSOLINE Take 100 mg by mouth at bedtime.   sildenafil 100 MG tablet Commonly known as: VIAGRA Take by mouth.   tamsulosin 0.4 MG Caps capsule Commonly known as: FLOMAX Take 1 capsule (0.4 mg total) by mouth daily after breakfast.   tiZANidine 4 MG tablet Commonly known as: ZANAFLEX Take 4 mg by mouth 3 (three) times daily as needed for muscle spasms.   traMADol 50 MG tablet Commonly known as: ULTRAM Take 50 mg by mouth daily as needed for moderate pain.   Tyler Aas FlexTouch 200 UNIT/ML Sopn Generic drug: Insulin Degludec 60 units once daily per patient.   trimethoprim-polymyxin b ophthalmic solution Commonly known as: POLYTRIM INSTILL 1 DROP INTO LEFT EYE 4 TIMES DAILY FOR 5 DAYS   VITAMIN B-12 IJ Inject 1,000 mg as directed every 30 (thirty) days. X 4   zolpidem 10 MG tablet Commonly known as: AMBIEN Take 10 mg by mouth at bedtime.       Allergies: No Known Allergies  Past Medical History, Surgical history, Social history, and Family History were reviewed and updated.  Review of Systems: All other 10 point review of systems is negative.   Physical Exam:  vitals were not taken for this visit.   Wt Readings from Last 3 Encounters:  05/17/19 229 lb (103.9 kg)  05/08/19 229 lb 6.4 oz (104.1 kg)  05/04/19 232 lb 9.6 oz (105.5 kg)    Ocular: Sclerae unicteric, pupils equal, round and reactive to light Ear-nose-throat: Oropharynx clear, dentition fair Lymphatic: No cervical or supraclavicular adenopathy Lungs no rales or rhonchi, good excursion bilaterally Heart regular rate and rhythm, no murmur appreciated Abd soft, nontender, positive bowel  sounds, no liver or spleen tip palpated on exam, no fluid wave  MSK no focal spinal tenderness, no joint edema Neuro: non-focal, well-oriented, appropriate affect Breasts: Deferred   Lab Results  Component Value Date   WBC 5.8 07/24/2019   HGB 11.8 (L) 07/24/2019   HCT 38.2 (L) 07/24/2019   MCV 85.3 07/24/2019   PLT 425 (H) 07/24/2019   Lab Results  Component Value Date   FERRITIN 66 05/08/2019   IRON 36 (L) 05/08/2019   TIBC 316 05/08/2019   UIBC 281 05/08/2019   IRONPCTSAT 11 (L) 05/08/2019   Lab Results  Component Value Date   RETICCTPCT 1.5 12/19/2018   RBC 4.48 07/24/2019   RETICCTABS 63.0 06/20/2015   No results found for: KPAFRELGTCHN, LAMBDASER, KAPLAMBRATIO Lab Results  Component Value Date   IGA 139 08/26/2017   No results found  for: Odetta Pink, SPEI   Chemistry      Component Value Date/Time   NA 138 05/08/2019 1033   NA 141 09/25/2016 0809   K 2.9 (LL) 05/08/2019 1033   K 2.8 (LL) 09/25/2016 0809   CL 98 05/08/2019 1033   CL 102 07/31/2016 0815   CO2 30 05/08/2019 1033   CO2 28 09/25/2016 0809   BUN 9 05/08/2019 1033   BUN 13.1 09/25/2016 0809   CREATININE 1.12 05/08/2019 1033   CREATININE 1.2 09/25/2016 0809      Component Value Date/Time   CALCIUM 10.1 05/08/2019 1033   CALCIUM 9.5 09/25/2016 0809   ALKPHOS 159 (H) 05/08/2019 1033   ALKPHOS 126 09/25/2016 0809   AST 14 (L) 05/08/2019 1033   AST 19 09/25/2016 0809   ALT 21 05/08/2019 1033   ALT 23 09/25/2016 0809   BILITOT 0.3 05/08/2019 1033   BILITOT 0.45 09/25/2016 0809       Impression and Plan: Mr. Fournet is a very pleasant 64 yo African American gentleman withmultifactorial anemia.  B 12 was given at today's visit.  We will see what his iron studies show and replace if needed.  We will him back in another 8 weeks.  He will contact our office with any questions or concerns. We can certainly see him sooner if needed.   Laverna Peace, NP 1/11/20212:42 PM

## 2019-07-24 NOTE — Patient Instructions (Signed)

## 2019-07-25 LAB — IRON AND TIBC
Iron: 30 ug/dL — ABNORMAL LOW (ref 42–163)
Saturation Ratios: 12 % — ABNORMAL LOW (ref 20–55)
TIBC: 254 ug/dL (ref 202–409)
UIBC: 223 ug/dL (ref 117–376)

## 2019-07-25 LAB — FERRITIN: Ferritin: 130 ng/mL (ref 24–336)

## 2019-07-26 ENCOUNTER — Telehealth: Payer: Self-pay | Admitting: Family

## 2019-07-26 NOTE — Telephone Encounter (Signed)
Spoke with patient to confirm iron appts 1/20 and 1/27 at 1130 am per 1/12 sch msg

## 2019-08-02 ENCOUNTER — Inpatient Hospital Stay: Payer: Medicare Other

## 2019-08-02 ENCOUNTER — Other Ambulatory Visit: Payer: Self-pay | Admitting: Family

## 2019-08-02 ENCOUNTER — Other Ambulatory Visit: Payer: Self-pay

## 2019-08-02 VITALS — BP 111/68 | HR 65 | Temp 97.3°F | Resp 18

## 2019-08-02 DIAGNOSIS — D510 Vitamin B12 deficiency anemia due to intrinsic factor deficiency: Secondary | ICD-10-CM | POA: Diagnosis not present

## 2019-08-02 DIAGNOSIS — D509 Iron deficiency anemia, unspecified: Secondary | ICD-10-CM

## 2019-08-02 MED ORDER — SODIUM CHLORIDE 0.9 % IV SOLN
Freq: Once | INTRAVENOUS | Status: DC | PRN
  Filled 2019-08-02: qty 250

## 2019-08-02 MED ORDER — SODIUM CHLORIDE 0.9 % IV SOLN
Freq: Once | INTRAVENOUS | Status: AC
  Filled 2019-08-02: qty 250

## 2019-08-02 MED ORDER — CYANOCOBALAMIN 1000 MCG/ML IJ SOLN: 1000.0000 ug | Freq: Once | INTRAMUSCULAR | Status: DC

## 2019-08-02 MED ORDER — SODIUM CHLORIDE 0.9 % IV SOLN
200.0000 mg | Freq: Once | INTRAVENOUS | Status: AC
  Administered 2019-08-02: 200 mg via INTRAVENOUS
  Filled 2019-08-02: qty 200

## 2019-08-02 NOTE — Patient Instructions (Signed)

## 2019-08-09 ENCOUNTER — Other Ambulatory Visit: Payer: Self-pay

## 2019-08-09 ENCOUNTER — Inpatient Hospital Stay: Payer: Medicare Other

## 2019-08-09 VITALS — BP 126/76 | HR 66 | Temp 97.1°F | Resp 17

## 2019-08-09 DIAGNOSIS — D509 Iron deficiency anemia, unspecified: Secondary | ICD-10-CM

## 2019-08-09 DIAGNOSIS — D510 Vitamin B12 deficiency anemia due to intrinsic factor deficiency: Secondary | ICD-10-CM | POA: Diagnosis not present

## 2019-08-09 MED ORDER — SODIUM CHLORIDE 0.9 % IV SOLN
Freq: Once | INTRAVENOUS | Status: AC
  Filled 2019-08-09: qty 250

## 2019-08-09 MED ORDER — SODIUM CHLORIDE 0.9 % IV SOLN
200.0000 mg | Freq: Once | INTRAVENOUS | Status: AC
  Administered 2019-08-09: 12:00:00 200 mg via INTRAVENOUS
  Filled 2019-08-09: qty 200

## 2019-08-09 NOTE — Progress Notes (Signed)
Pt declined to stay for the 30 minute post infusion observation period. Pt stated he has tolerated the medication prior. Pt verbalized understanding of allergic reaction and had no further questions. Pt stated he would seek emergency treatment if needed.

## 2019-08-09 NOTE — Patient Instructions (Signed)

## 2019-09-07 ENCOUNTER — Telehealth: Payer: Self-pay | Admitting: Pulmonary Disease

## 2019-09-07 NOTE — Telephone Encounter (Signed)
Patient is scheduled for a 2 month f/u for OSA tomorrow at 9am with Dr. Halford Chessman. I called [employer] to see if we could get a download. The rep I spoke with stated that the patient received a machine from another company back in 2018 and did not authorize a new machine for him. Per [employer]'s records, they did not attempt to change the settings on his current machine.   I called the patient but I received a busy tone. I will call him back later this morning to see if he is still using the old machine and if he can bring the SD card or machine with him tomorrow. Will route message to [facility] and Dr. Halford Chessman so they are aware.

## 2019-09-08 ENCOUNTER — Encounter: Payer: Self-pay | Admitting: Pulmonary Disease

## 2019-09-08 ENCOUNTER — Ambulatory Visit: Payer: PPO | Admitting: Pulmonary Disease

## 2019-09-08 ENCOUNTER — Other Ambulatory Visit: Payer: Self-pay

## 2019-09-08 VITALS — BP 134/70 | HR 72 | Temp 97.3°F | Ht 66.0 in | Wt 236.6 lb

## 2019-09-08 DIAGNOSIS — J449 Chronic obstructive pulmonary disease, unspecified: Secondary | ICD-10-CM | POA: Diagnosis not present

## 2019-09-08 DIAGNOSIS — G4733 Obstructive sleep apnea (adult) (pediatric): Secondary | ICD-10-CM | POA: Diagnosis not present

## 2019-09-08 MED ORDER — ALBUTEROL SULFATE HFA 108 (90 BASE) MCG/ACT IN AERS: 2.0000 | INHALATION_SPRAY | Freq: Four times a day (QID) | RESPIRATORY_TRACT | 3 refills | Status: AC | PRN

## 2019-09-08 NOTE — Patient Instructions (Signed)
Albuterol two puffs every 6 hours as needed for cough, wheeze, chest congestion, or shortness of breath  Will try to arrange for new medical supply company to get your CPAP set up  Follow up in 3 months

## 2019-09-08 NOTE — Progress Notes (Signed)
[facility]  Chief Complaint  Patient presents with  . Follow-up    OSA and COPD overlap syndrome    Constitutional:  BP 134/70 (BP Location: Left Arm, Patient Position: Sitting, Cuff Size: Large)   Pulse 72   Temp (!) 97.3 F (36.3 C)   Ht '5\' 6"'  (1.676 m)   Wt 236 lb 9.6 oz (107.3 kg)   SpO2 99% Comment: on room air  BMI 38.19 kg/m   Past Medical History:  CVA, ETOH, OA, DM, GERD, CHF, HLD, Gout, HTN, Low T, Vit D deficiency  Brief Summary:  James Zuniga is a 64 y.o. male former smoker with obstructive sleep apnea and emphysema.  He had CPAP titration in November.  He was to be set up with CPAP 11 cm H2O.  It is unclear what happened, but he was told that he owned money to [employer] (about $1400) and that his insurance wouldn't pay for CPAP.  He says he has CPAP at home and is about 64 yrs old.  He isn't sure what the setting is on.  He no longer has oxygen at home.  He gets winded with walking.  Would like albuterol inhaler.  Not having cough, wheeze, sputum, chest pain, or leg swelling.   Physical Exam:   Appearance - well kempt   ENMT - no sinus tenderness, no nasal discharge, no oral exudate  Neck - no masses, trachea midline, no thyromegaly, no elevation in JVP  Respiratory - normal appearance of chest wall, normal respiratory effort w/o accessory muscle use, no dullness on percussion, no wheezing or rales  CV - s1s2 regular rate and rhythm, no murmurs, no peripheral edema, radial pulses symmetric  GI - soft, non tender  Lymph - no adenopathy noted in neck and axillary areas  MSK - normal gait  Ext - no cyanosis, clubbing, or joint inflammation noted  Skin - no rashes, lesions, or ulcers  Neuro - normal strength, oriented x 3  Psych - normal mood and affect    Plan:   Obstructive sleep apnea. - will work on clarifying what the issue is with his DME >> he will either stick with [employer] or will need to be changed to  new DME - he will need to be on CPAP 11 cm H2O  COPD with emphysema. - he has trouble affording medications - prn duoneb - prn albuterol inhaler  Chronic systolic CHF. - followed by Dr. Otho Perl with cardiology and [facility]  Dyspnea on exertion. - from CHF, COPD/emphysema, and deconditioning - encouraged him to maintain a regular exercise regimen   Patient Instructions  Albuterol two puffs every 6 hours as needed for cough, wheeze, chest congestion, or shortness of breath  Will try to arrange for new medical supply company to get your CPAP set up  Follow up in 3 months   Time spent 24 minutes   Chesley Mires, MD [facility] Pager: [phone] 09/08/2019, 9:36 AM  Flow Sheet     Pulmonary tests:  PFT 01/27/13 >> FEV1 2.50 (84%), FEV1% 83, TLC 4.06 (61%), DLCO 60%, no BD  Chest imaging:  CT chest 03/02/13 >> mild paraseptal emphysema, 4 mm RUL nodule CT chest 07/26/15 >> stable RUL nodule  Sleep tests:  PSG 04/21/19 >> AHI 15.5, SpO2 low 83% CPAP titration 05/17/19 >> CPAP 11 cm H2O  Cardiac tests:  Echo 12/25/16 >> EF 40 to 45%, PAS 33 mmHg, mild MR  Medications:  Allergies as of 09/08/2019   No Known Allergies     Medication List       Accurate as of September 08, 2019  9:36 AM. If you have any questions, ask your nurse or doctor.        STOP taking these medications   albuterol (2.5 MG/3ML) 0.083% nebulizer solution Commonly known as: PROVENTIL Replaced by: albuterol 108 (90 Base) MCG/ACT inhaler Stopped by: Chesley Mires, MD     TAKE these medications   albuterol 108 (90 Base) MCG/ACT inhaler Commonly known as: VENTOLIN HFA Inhale 2 puffs into the lungs every 6 (six) hours as needed for wheezing or shortness of breath. Replaces: albuterol (2.5 MG/3ML) 0.083% nebulizer solution Started by: Chesley Mires, MD   allopurinol 300 MG tablet Commonly known as: ZYLOPRIM Take 300 mg by mouth daily.   amLODipine 5 MG tablet Commonly  known as: NORVASC Take 5 mg by mouth daily.   aspirin 81 MG EC tablet Take 81 mg by mouth daily. Swallow whole.   atorvastatin 80 MG tablet Commonly known as: LIPITOR Take 1 tablet (80 mg total) by mouth every morning.   Basaglar KwikPen 100 UNIT/ML Sopn Inject 60 Units into the skin daily.   clopidogrel 75 MG tablet Commonly known as: PLAVIX Take 1 tablet (75 mg total) by mouth daily.   diclofenac sodium 1 % Gel Commonly known as: VOLTAREN Use 4 grams to knees tid prn   DULoxetine 60 MG capsule Commonly known as: CYMBALTA Take 60 mg by mouth 2 (two) times daily.   Fifty50 Glucose Meter 2.0 w/Device Kit To check blood sugars TID or Use as instructed Include strips. Lancets, lancet device, control solution. batteries   Fifty50 Pen Needles 32G X 4 MM Misc Generic drug: Insulin Pen Needle 1 each by Misc.(Non-Drug; Combo Route) route daily.   furosemide 20 MG tablet Commonly known as: LASIX Take 20 mg by mouth every Tuesday, Thursday, and Saturday at 6 PM.   gabapentin 300 MG capsule Commonly known as: NEURONTIN Take 300 mg by mouth 4 (four) times daily.   insulin NPH-regular Human (70-30) 100 UNIT/ML injection Commonly known as: NovoLIN 70/30 Please inject 25 units twice daily before breakfast and dinner. What changed:   how much to take  how to take this  when to take this  additional instructions   insulin regular 100 units/mL injection Commonly known as: NOVOLIN R Use as sliding scale up to 10 units tid with meals   ipratropium-albuterol 0.5-2.5 (3) MG/3ML Soln Commonly known as: DUONEB USE 1 VIAL IN NEBULIZER 4 TIMES DAILY   losartan 100 MG tablet Commonly known as: COZAAR Take 100 mg by mouth daily.   metoprolol succinate 25 MG 24 hr tablet Commonly known as: TOPROL-XL Take 1 tablet (25 mg total) by mouth daily. What changed:   how much to take  when to take this   mupirocin ointment 2 % Commonly known as: BACTROBAN APPLY OINTMENT  TOPICALLY TO AFFECTED AREA(S) TWICE DAILY AS NEEDED   nystatin cream Commonly known as: MYCOSTATIN Use bid to affected area x 10 days, then prn   onetouch ultrasoft lancets USE 1 LANCET TO CHECK GLUCOSE IN THE MORNING BEFORE BREAKFAST   pantoprazole 20 MG tablet Commonly known as: PROTONIX Take 20 mg by mouth daily.   potassium chloride SA 20 MEQ tablet Commonly known as: KLOR-CON Take 1 tablet (20 mEq total) by mouth 2 (two) times daily. What changed:   when to take this  additional instructions  Precision QID Test test strip Generic drug: glucose blood 1 each by Misc.(Non-Drug; Combo Route) route daily.   primidone 50 MG tablet Commonly known as: MYSOLINE Take 100 mg by mouth at bedtime.   sildenafil 100 MG tablet Commonly known as: VIAGRA Take by mouth.   spironolactone 25 MG tablet Commonly known as: ALDACTONE Take 25 mg by mouth daily.   tamsulosin 0.4 MG Caps capsule Commonly known as: FLOMAX Take 1 capsule (0.4 mg total) by mouth daily after breakfast.   tiZANidine 4 MG tablet Commonly known as: ZANAFLEX Take 4 mg by mouth 3 (three) times daily as needed for muscle spasms.   traMADol 50 MG tablet Commonly known as: ULTRAM Take 50 mg by mouth daily as needed for moderate pain.   Tyler Aas FlexTouch 200 UNIT/ML Sopn Generic drug: Insulin Degludec 60 units once daily per patient.   trimethoprim-polymyxin b ophthalmic solution Commonly known as: POLYTRIM INSTILL 1 DROP INTO LEFT EYE 4 TIMES DAILY FOR 5 DAYS   VITAMIN B-12 IJ Inject 1,000 mg as directed every 30 (thirty) days. X 4   zolpidem 10 MG tablet Commonly known as: AMBIEN Take 10 mg by mouth at bedtime.       Past Surgical History:  He  has a past surgical history that includes Knee arthroscopy (Left); TEE without cardioversion (N/A, 12/24/2016); Loop recorder implant (06/2017); Esophagogastroduodenoscopy (N/A, 08/26/2017); Colonoscopy (N/A, 08/26/2017); enteroscopy (N/A, 05/24/2018); Hot  hemostasis (N/A, 05/24/2018); and polypectomy (05/24/2018).  Family History:  His family history includes Diabetes in his father and sister; Heart attack in his brother and brother; Heart disease in his mother; Stroke in his brother and paternal uncle.  Social History:  He  reports that he quit smoking about 2 years ago. His smoking use included cigarettes. He started smoking about 36 years ago. He has a 20.00 pack-year smoking history. He has never used smokeless tobacco. He reports that he does not drink alcohol or use drugs.

## 2019-09-21 ENCOUNTER — Inpatient Hospital Stay: Payer: Medicare Other | Attending: Family

## 2019-09-21 ENCOUNTER — Inpatient Hospital Stay (HOSPITAL_BASED_OUTPATIENT_CLINIC_OR_DEPARTMENT_OTHER): Payer: Medicare Other | Admitting: Family

## 2019-09-21 ENCOUNTER — Other Ambulatory Visit: Payer: Self-pay

## 2019-09-21 ENCOUNTER — Inpatient Hospital Stay: Payer: Medicare Other

## 2019-09-21 ENCOUNTER — Encounter: Payer: Self-pay | Admitting: Family

## 2019-09-21 VITALS — BP 124/81 | HR 76 | Temp 96.4°F | Resp 19 | Ht 66.0 in | Wt 237.0 lb

## 2019-09-21 DIAGNOSIS — D510 Vitamin B12 deficiency anemia due to intrinsic factor deficiency: Secondary | ICD-10-CM | POA: Insufficient documentation

## 2019-09-21 DIAGNOSIS — D500 Iron deficiency anemia secondary to blood loss (chronic): Secondary | ICD-10-CM

## 2019-09-21 DIAGNOSIS — D509 Iron deficiency anemia, unspecified: Secondary | ICD-10-CM | POA: Insufficient documentation

## 2019-09-21 LAB — CMP (CANCER CENTER ONLY)
ALT: 22 U/L (ref 0–44)
AST: 16 U/L (ref 15–41)
Albumin: 4.4 g/dL (ref 3.5–5.0)
Alkaline Phosphatase: 101 U/L (ref 38–126)
Anion gap: 7 (ref 5–15)
BUN: 16 mg/dL (ref 8–23)
CO2: 29 mmol/L (ref 22–32)
Calcium: 10.6 mg/dL — ABNORMAL HIGH (ref 8.9–10.3)
Chloride: 102 mmol/L (ref 98–111)
Creatinine: 1.13 mg/dL (ref 0.61–1.24)
GFR, Est AFR Am: >60 mL/min (ref >60)
GFR, Estimated: >60 mL/min (ref >60)
Glucose, Bld: 226 mg/dL — ABNORMAL HIGH (ref 70–99)
Potassium: 3.9 mmol/L (ref 3.5–5.1)
Sodium: 138 mmol/L (ref 135–145)
Total Bilirubin: 0.3 mg/dL (ref 0.3–1.2)
Total Protein: 7.6 g/dL (ref 6.5–8.1)

## 2019-09-21 LAB — CBC WITH DIFFERENTIAL (CANCER CENTER ONLY)
Abs Immature Granulocytes: 0.02 10*3/uL (ref 0.00–0.07)
Basophils Absolute: 0 10*3/uL (ref 0.0–0.1)
Basophils Relative: 1 %
Eosinophils Absolute: 0.2 10*3/uL (ref 0.0–0.5)
Eosinophils Relative: 3 %
HCT: 36.6 % — ABNORMAL LOW (ref 39.0–52.0)
Hemoglobin: 11.1 g/dL — ABNORMAL LOW (ref 13.0–17.0)
Immature Granulocytes: 0 %
Lymphocytes Relative: 25 %
Lymphs Abs: 1.7 10*3/uL (ref 0.7–4.0)
MCH: 25.9 pg — ABNORMAL LOW (ref 26.0–34.0)
MCHC: 30.3 g/dL (ref 30.0–36.0)
MCV: 85.3 fL (ref 80.0–100.0)
Monocytes Absolute: 0.4 10*3/uL (ref 0.1–1.0)
Monocytes Relative: 7 %
Neutro Abs: 4.3 10*3/uL (ref 1.7–7.7)
Neutrophils Relative %: 64 %
Platelet Count: 330 10*3/uL (ref 150–400)
RBC: 4.29 MIL/uL (ref 4.22–5.81)
RDW: 15 % (ref 11.5–15.5)
WBC Count: 6.6 10*3/uL (ref 4.0–10.5)
nRBC: 0 % (ref 0.0–0.2)

## 2019-09-21 LAB — VITAMIN B12: Vitamin B-12: 416 pg/mL (ref 180–914)

## 2019-09-21 MED ORDER — CYANOCOBALAMIN 1000 MCG/ML IJ SOLN
1000.0000 ug | Freq: Once | INTRAMUSCULAR | Status: AC
  Administered 2019-09-21: 1000 ug via INTRAMUSCULAR

## 2019-09-21 MED ORDER — CYANOCOBALAMIN 1000 MCG/ML IJ SOLN
INTRAMUSCULAR | Status: AC
  Filled 2019-09-21: qty 1

## 2019-09-21 NOTE — Progress Notes (Signed)
Hematology and Oncology Follow Up Visit  James Zuniga [id number] 10-Jun-1956 64 y.o. 09/21/2019   Principle Diagnosis:  Iron deficiency anemia Hypotestosteronemia Chronic liver lesions Pernicious anemia  Of Note: History of stroke in June 2018, no more Testosterone or ESA's  Current Therapy:   IV iron as indicated  Vit B12 1 mg IM q month       Interim History:  James Zuniga is here today for follow-up and B 12 injection. He is feeling fatigued at this time.  He notes occasional chest discomfort and SOB at times with over exertion. He states that he takes a break to rest and uses his albuterol inhaler to resolve his symptoms.  B 12 injection in January was 996.  No episodes of bleeding. No bruising or petechiae.  No fever, chills, n/v, cough, rash, dizziness, palpitations, abdominal pain or changes in bowel or bladder habits.  He has numbness and tingling in his extremities waxes and wanes.  No swelling or tenderness in his extremities.  No falls or syncopal episodes to report.  He has maintained a good appetite and is hydrating properly. His weight is stable.   ECOG Performance Status: 1 - Symptomatic but completely ambulatory  Medications:  Allergies as of 09/21/2019   No Known Allergies     Medication List       Accurate as of September 21, 2019  1:23 PM. If you have any questions, ask your nurse or doctor.        albuterol 108 (90 Base) MCG/ACT inhaler Commonly known as: VENTOLIN HFA Inhale 2 puffs into the lungs every 6 (six) hours as needed for wheezing or shortness of breath.   allopurinol 300 MG tablet Commonly known as: ZYLOPRIM Take 300 mg by mouth daily.   amLODipine 5 MG tablet Commonly known as: NORVASC Take 5 mg by mouth daily.   aspirin 81 MG EC tablet Take 81 mg by mouth daily. Swallow whole.   atorvastatin 80 MG tablet Commonly known as: LIPITOR Take 1 tablet (80 mg total) by mouth every morning.   Basaglar KwikPen 100 UNIT/ML Inject 60 Units  into the skin daily.   clopidogrel 75 MG tablet Commonly known as: PLAVIX Take 1 tablet (75 mg total) by mouth daily.   diclofenac sodium 1 % Gel Commonly known as: VOLTAREN Use 4 grams to knees tid prn   DULoxetine 60 MG capsule Commonly known as: CYMBALTA Take 60 mg by mouth 2 (two) times daily.   Fifty50 Glucose Meter 2.0 w/Device Kit To check blood sugars TID or Use as instructed Include strips. Lancets, lancet device, control solution. batteries   Fifty50 Pen Needles 32G X 4 MM Misc Generic drug: Insulin Pen Needle 1 each by Misc.(Non-Drug; Combo Route) route daily.   furosemide 20 MG tablet Commonly known as: LASIX Take 20 mg by mouth every Tuesday, Thursday, and Saturday at 6 PM.   gabapentin 300 MG capsule Commonly known as: NEURONTIN Take 300 mg by mouth 4 (four) times daily.   insulin NPH-regular Human (70-30) 100 UNIT/ML injection Commonly known as: NovoLIN 70/30 Please inject 25 units twice daily before breakfast and dinner. What changed:   how much to take  how to take this  when to take this  additional instructions   insulin regular 100 units/mL injection Commonly known as: NOVOLIN R Use as sliding scale up to 10 units tid with meals   ipratropium-albuterol 0.5-2.5 (3) MG/3ML Soln Commonly known as: DUONEB USE 1 VIAL IN NEBULIZER 4 TIMES DAILY  losartan 100 MG tablet Commonly known as: COZAAR Take 100 mg by mouth daily.   metoprolol succinate 25 MG 24 hr tablet Commonly known as: TOPROL-XL Take 1 tablet (25 mg total) by mouth daily. What changed:   how much to take  when to take this   mupirocin ointment 2 % Commonly known as: BACTROBAN APPLY OINTMENT TOPICALLY TO AFFECTED AREA(S) TWICE DAILY AS NEEDED   nystatin cream Commonly known as: MYCOSTATIN Use bid to affected area x 10 days, then prn   onetouch ultrasoft lancets USE 1 LANCET TO CHECK GLUCOSE IN THE MORNING BEFORE BREAKFAST   pantoprazole 20 MG tablet Commonly known  as: PROTONIX Take 20 mg by mouth daily.   potassium chloride SA 20 MEQ tablet Commonly known as: KLOR-CON Take 1 tablet (20 mEq total) by mouth 2 (two) times daily. What changed:   when to take this  additional instructions   Precision QID Test test strip Generic drug: glucose blood 1 each by Misc.(Non-Drug; Combo Route) route daily.   primidone 50 MG tablet Commonly known as: MYSOLINE Take 100 mg by mouth at bedtime.   sildenafil 100 MG tablet Commonly known as: VIAGRA Take by mouth.   spironolactone 25 MG tablet Commonly known as: ALDACTONE Take 25 mg by mouth daily.   tamsulosin 0.4 MG Caps capsule Commonly known as: FLOMAX Take 1 capsule (0.4 mg total) by mouth daily after breakfast.   tiZANidine 4 MG tablet Commonly known as: ZANAFLEX Take 4 mg by mouth 3 (three) times daily as needed for muscle spasms.   traMADol 50 MG tablet Commonly known as: ULTRAM Take 50 mg by mouth daily as needed for moderate pain.   Tyler Aas FlexTouch 200 UNIT/ML FlexTouch Pen Generic drug: insulin degludec 60 units once daily per patient.   trimethoprim-polymyxin b ophthalmic solution Commonly known as: POLYTRIM INSTILL 1 DROP INTO LEFT EYE 4 TIMES DAILY FOR 5 DAYS   VITAMIN B-12 IJ Inject 1,000 mg as directed every 30 (thirty) days. X 4   zolpidem 10 MG tablet Commonly known as: AMBIEN Take 10 mg by mouth at bedtime.       Allergies: No Known Allergies  Past Medical History, Surgical history, Social history, and Family History were reviewed and updated.  Review of Systems: All other 10 point review of systems is negative.   Physical Exam:  vitals were not taken for this visit.   Wt Readings from Last 3 Encounters:  09/08/19 236 lb 9.6 oz (107.3 kg)  07/24/19 231 lb (104.8 kg)  05/17/19 229 lb (103.9 kg)    Ocular: Sclerae unicteric, pupils equal, round and reactive to light Ear-nose-throat: Oropharynx clear, dentition fair Lymphatic: No cervical or  supraclavicular adenopathy Lungs no rales or rhonchi, good excursion bilaterally Heart regular rate and rhythm, no murmur appreciated Abd soft, nontender, positive bowel sounds, no liver or spleen tip palpated on exam, no fluid wave  MSK no focal spinal tenderness, no joint edema Neuro: non-focal, well-oriented, appropriate affect Breasts: Deferred   Lab Results  Component Value Date   WBC 5.8 07/24/2019   HGB 11.8 (L) 07/24/2019   HCT 38.2 (L) 07/24/2019   MCV 85.3 07/24/2019   PLT 425 (H) 07/24/2019   Lab Results  Component Value Date   FERRITIN 130 07/24/2019   IRON 30 (L) 07/24/2019   TIBC 254 07/24/2019   UIBC 223 07/24/2019   IRONPCTSAT 12 (L) 07/24/2019   Lab Results  Component Value Date   RETICCTPCT 1.5 12/19/2018   RBC 4.48  07/24/2019   RETICCTABS 63.0 06/20/2015   No results found for: Nils [facility] Lab Results  Component Value Date   IGA 139 08/26/2017   No results found for: Odetta Pink, SPEI   Chemistry      Component Value Date/Time   NA 136 07/24/2019 1347   NA 141 09/25/2016 0809   K 3.2 (L) 07/24/2019 1347   K 2.8 (LL) 09/25/2016 0809   CL 100 07/24/2019 1347   CL 102 07/31/2016 0815   CO2 26 07/24/2019 1347   CO2 28 09/25/2016 0809   BUN 6 (L) 07/24/2019 1347   BUN 13.1 09/25/2016 0809   CREATININE 1.10 07/24/2019 1347   CREATININE 1.2 09/25/2016 0809      Component Value Date/Time   CALCIUM 9.7 07/24/2019 1347   CALCIUM 9.5 09/25/2016 0809   ALKPHOS 144 (H) 07/24/2019 1347   ALKPHOS 126 09/25/2016 0809   AST 13 (L) 07/24/2019 1347   AST 19 09/25/2016 0809   ALT 18 07/24/2019 1347   ALT 23 09/25/2016 0809   BILITOT 0.3 07/24/2019 1347   BILITOT 0.45 09/25/2016 0809       Impression and Plan: Mr. Antonini is a very pleasant 64 yo African American gentleman withmultifactorial anemia. B 12 injection given today.  Iron studies are pending. We will replace if  needed.  We will see him in another 8 weeks.  He will contact our office with any questions or concerns. We can certainly see him sooner if needed.   Laverna Peace, NP 3/11/20211:23 PM

## 2019-09-21 NOTE — Patient Instructions (Signed)

## 2019-09-22 ENCOUNTER — Other Ambulatory Visit: Payer: Self-pay | Admitting: Family

## 2019-09-22 LAB — IRON AND TIBC
Iron: 39 ug/dL — ABNORMAL LOW (ref 42–163)
Saturation Ratios: 12 % — ABNORMAL LOW (ref 20–55)
TIBC: 333 ug/dL (ref 202–409)
UIBC: 294 ug/dL (ref 117–376)

## 2019-09-22 LAB — FERRITIN: Ferritin: 96 ng/mL (ref 24–336)

## 2019-09-25 ENCOUNTER — Telehealth: Payer: Self-pay | Admitting: Family

## 2019-09-25 NOTE — Telephone Encounter (Signed)
Called and spoke with patient regarding appointments added per 3/12 sch msg

## 2019-09-26 ENCOUNTER — Inpatient Hospital Stay: Payer: Medicare Other

## 2019-09-26 ENCOUNTER — Other Ambulatory Visit: Payer: Self-pay

## 2019-09-26 ENCOUNTER — Ambulatory Visit: Payer: Medicare Other

## 2019-09-26 VITALS — BP 116/81 | HR 67 | Temp 97.3°F | Resp 18

## 2019-09-26 DIAGNOSIS — D509 Iron deficiency anemia, unspecified: Secondary | ICD-10-CM

## 2019-09-26 DIAGNOSIS — D510 Vitamin B12 deficiency anemia due to intrinsic factor deficiency: Secondary | ICD-10-CM | POA: Diagnosis not present

## 2019-09-26 MED ORDER — SODIUM CHLORIDE 0.9 % IV SOLN
200.0000 mg | Freq: Once | INTRAVENOUS | Status: AC
  Administered 2019-09-26: 200 mg via INTRAVENOUS
  Filled 2019-09-26: qty 200

## 2019-09-26 MED ORDER — SODIUM CHLORIDE 0.9 % IV SOLN
Freq: Once | INTRAVENOUS | Status: AC
  Filled 2019-09-26: qty 250

## 2019-09-26 NOTE — Progress Notes (Signed)
Pt. Refused to wait 30 min post infusion. Released stable and ASX. 

## 2019-09-26 NOTE — Patient Instructions (Signed)

## 2019-09-29 ENCOUNTER — Other Ambulatory Visit: Payer: Self-pay

## 2019-09-29 ENCOUNTER — Inpatient Hospital Stay: Payer: Medicare Other

## 2019-09-29 VITALS — BP 129/90 | HR 71 | Temp 97.5°F | Resp 17

## 2019-09-29 DIAGNOSIS — D509 Iron deficiency anemia, unspecified: Secondary | ICD-10-CM

## 2019-09-29 DIAGNOSIS — D510 Vitamin B12 deficiency anemia due to intrinsic factor deficiency: Secondary | ICD-10-CM | POA: Diagnosis not present

## 2019-09-29 MED ORDER — SODIUM CHLORIDE 0.9 % IV SOLN
200.0000 mg | Freq: Once | INTRAVENOUS | Status: AC
  Administered 2019-09-29: 200 mg via INTRAVENOUS
  Filled 2019-09-29: qty 200

## 2019-09-29 MED ORDER — SODIUM CHLORIDE 0.9 % IV SOLN
Freq: Once | INTRAVENOUS | Status: AC
  Filled 2019-09-29: qty 250

## 2019-09-29 NOTE — Progress Notes (Signed)
Pt declined to stay for post infusion observation period. Pt stated he has tolerated this infusion prior and aware to call or seek treatment if needed. Pt verbalized understanding and had no further questions.

## 2019-10-03 ENCOUNTER — Inpatient Hospital Stay: Payer: Medicare Other

## 2019-10-04 ENCOUNTER — Inpatient Hospital Stay: Payer: Medicare Other

## 2019-10-04 ENCOUNTER — Other Ambulatory Visit: Payer: Self-pay

## 2019-10-04 VITALS — BP 113/69 | HR 72 | Temp 97.8°F | Resp 17

## 2019-10-04 DIAGNOSIS — D509 Iron deficiency anemia, unspecified: Secondary | ICD-10-CM

## 2019-10-04 DIAGNOSIS — D510 Vitamin B12 deficiency anemia due to intrinsic factor deficiency: Secondary | ICD-10-CM | POA: Diagnosis not present

## 2019-10-04 MED ORDER — SODIUM CHLORIDE 0.9 % IV SOLN
200.0000 mg | Freq: Once | INTRAVENOUS | Status: AC
  Administered 2019-10-04: 200 mg via INTRAVENOUS
  Filled 2019-10-04: qty 10

## 2019-10-04 MED ORDER — SODIUM CHLORIDE 0.9 % IV SOLN
Freq: Once | INTRAVENOUS | Status: AC
  Filled 2019-10-04: qty 250

## 2019-10-04 NOTE — Patient Instructions (Signed)

## 2019-10-04 NOTE — Progress Notes (Signed)
Pt. Refused to wait 30 minutes post infusion. Releaased stable and ASX.

## 2019-10-06 ENCOUNTER — Inpatient Hospital Stay: Payer: Medicare Other

## 2019-10-10 ENCOUNTER — Other Ambulatory Visit: Payer: Self-pay

## 2019-10-10 ENCOUNTER — Inpatient Hospital Stay: Payer: Medicare Other

## 2019-10-10 VITALS — BP 126/87 | HR 75 | Temp 97.0°F | Resp 19

## 2019-10-10 DIAGNOSIS — D509 Iron deficiency anemia, unspecified: Secondary | ICD-10-CM

## 2019-10-10 DIAGNOSIS — D510 Vitamin B12 deficiency anemia due to intrinsic factor deficiency: Secondary | ICD-10-CM | POA: Diagnosis not present

## 2019-10-10 MED ORDER — SODIUM CHLORIDE 0.9 % IV SOLN
200.0000 mg | Freq: Once | INTRAVENOUS | Status: AC
  Administered 2019-10-10: 200 mg via INTRAVENOUS
  Filled 2019-10-10: qty 200

## 2019-10-10 NOTE — Patient Instructions (Signed)

## 2019-10-13 ENCOUNTER — Ambulatory Visit: Payer: Medicare Other

## 2019-10-16 ENCOUNTER — Other Ambulatory Visit: Payer: Self-pay

## 2019-10-16 ENCOUNTER — Inpatient Hospital Stay: Payer: Medicare Other | Attending: Family

## 2019-10-16 ENCOUNTER — Ambulatory Visit: Payer: Medicare Other

## 2019-10-16 VITALS — BP 111/78 | HR 68 | Temp 97.1°F | Resp 18

## 2019-10-16 DIAGNOSIS — D509 Iron deficiency anemia, unspecified: Secondary | ICD-10-CM | POA: Insufficient documentation

## 2019-10-16 DIAGNOSIS — D510 Vitamin B12 deficiency anemia due to intrinsic factor deficiency: Secondary | ICD-10-CM | POA: Insufficient documentation

## 2019-10-16 MED ORDER — SODIUM CHLORIDE 0.9 % IV SOLN
Freq: Once | INTRAVENOUS | Status: AC
  Filled 2019-10-16: qty 250

## 2019-10-16 MED ORDER — CYANOCOBALAMIN 1000 MCG/ML IJ SOLN
1000.0000 ug | Freq: Once | INTRAMUSCULAR | Status: AC
  Administered 2019-10-16: 1000 ug via INTRAMUSCULAR

## 2019-10-16 MED ORDER — CYANOCOBALAMIN 1000 MCG/ML IJ SOLN
INTRAMUSCULAR | Status: AC
  Filled 2019-10-16: qty 1

## 2019-10-16 MED ORDER — SODIUM CHLORIDE 0.9 % IV SOLN
200.0000 mg | Freq: Once | INTRAVENOUS | Status: AC
  Administered 2019-10-16: 200 mg via INTRAVENOUS
  Filled 2019-10-16: qty 10

## 2019-10-16 NOTE — Patient Instructions (Signed)

## 2019-10-26 ENCOUNTER — Other Ambulatory Visit: Payer: Self-pay | Admitting: Internal Medicine

## 2019-11-21 ENCOUNTER — Inpatient Hospital Stay: Payer: Medicare Other

## 2019-11-21 ENCOUNTER — Inpatient Hospital Stay (HOSPITAL_BASED_OUTPATIENT_CLINIC_OR_DEPARTMENT_OTHER): Payer: Medicare Other | Admitting: Family

## 2019-11-21 ENCOUNTER — Telehealth: Payer: Self-pay | Admitting: Family

## 2019-11-21 ENCOUNTER — Inpatient Hospital Stay: Payer: Medicare Other | Attending: Family

## 2019-11-21 ENCOUNTER — Other Ambulatory Visit: Payer: Self-pay

## 2019-11-21 VITALS — BP 140/90 | HR 70 | Temp 97.3°F | Resp 18 | Ht 66.0 in | Wt 243.0 lb

## 2019-11-21 DIAGNOSIS — D510 Vitamin B12 deficiency anemia due to intrinsic factor deficiency: Secondary | ICD-10-CM | POA: Insufficient documentation

## 2019-11-21 DIAGNOSIS — D509 Iron deficiency anemia, unspecified: Secondary | ICD-10-CM | POA: Insufficient documentation

## 2019-11-21 DIAGNOSIS — D500 Iron deficiency anemia secondary to blood loss (chronic): Secondary | ICD-10-CM

## 2019-11-21 LAB — VITAMIN B12: Vitamin B-12: 551 pg/mL (ref 180–914)

## 2019-11-21 LAB — CBC WITH DIFFERENTIAL (CANCER CENTER ONLY)
Abs Immature Granulocytes: 0.02 10*3/uL (ref 0.00–0.07)
Basophils Absolute: 0 10*3/uL (ref 0.0–0.1)
Basophils Relative: 1 %
Eosinophils Absolute: 0.2 10*3/uL (ref 0.0–0.5)
Eosinophils Relative: 4 %
HCT: 37.4 % — ABNORMAL LOW (ref 39.0–52.0)
Hemoglobin: 11.2 g/dL — ABNORMAL LOW (ref 13.0–17.0)
Immature Granulocytes: 0 %
Lymphocytes Relative: 30 %
Lymphs Abs: 1.9 10*3/uL (ref 0.7–4.0)
MCH: 26.2 pg (ref 26.0–34.0)
MCHC: 29.9 g/dL — ABNORMAL LOW (ref 30.0–36.0)
MCV: 87.4 fL (ref 80.0–100.0)
Monocytes Absolute: 0.4 10*3/uL (ref 0.1–1.0)
Monocytes Relative: 6 %
Neutro Abs: 3.8 10*3/uL (ref 1.7–7.7)
Neutrophils Relative %: 59 %
Platelet Count: 374 10*3/uL (ref 150–400)
RBC: 4.28 MIL/uL (ref 4.22–5.81)
RDW: 16.1 % — ABNORMAL HIGH (ref 11.5–15.5)
WBC Count: 6.4 10*3/uL (ref 4.0–10.5)
nRBC: 0 % (ref 0.0–0.2)

## 2019-11-21 LAB — CMP (CANCER CENTER ONLY)
ALT: 23 U/L (ref 0–44)
AST: 21 U/L (ref 15–41)
Albumin: 4.5 g/dL (ref 3.5–5.0)
Alkaline Phosphatase: 98 U/L (ref 38–126)
Anion gap: 8 (ref 5–15)
BUN: 17 mg/dL (ref 8–23)
CO2: 29 mmol/L (ref 22–32)
Calcium: 10.7 mg/dL — ABNORMAL HIGH (ref 8.9–10.3)
Chloride: 102 mmol/L (ref 98–111)
Creatinine: 1.34 mg/dL — ABNORMAL HIGH (ref 0.61–1.24)
GFR, Est AFR Am: >60 mL/min (ref >60)
GFR, Estimated: 56 mL/min — ABNORMAL LOW (ref >60)
Glucose, Bld: 163 mg/dL — ABNORMAL HIGH (ref 70–99)
Potassium: 4.1 mmol/L (ref 3.5–5.1)
Sodium: 139 mmol/L (ref 135–145)
Total Bilirubin: 0.3 mg/dL (ref 0.3–1.2)
Total Protein: 7.6 g/dL (ref 6.5–8.1)

## 2019-11-21 MED ORDER — CYANOCOBALAMIN 1000 MCG/ML IJ SOLN
1000.0000 ug | Freq: Once | INTRAMUSCULAR | Status: AC
  Administered 2019-11-21: 1000 ug via INTRAMUSCULAR

## 2019-11-21 MED ORDER — CYANOCOBALAMIN 1000 MCG/ML IJ SOLN
INTRAMUSCULAR | Status: AC
  Filled 2019-11-21: qty 1

## 2019-11-21 NOTE — Progress Notes (Signed)
Hematology and Oncology Follow Up Visit  James Zuniga [id number] 23-Dec-1955 64 y.o. 11/21/2019   Principle Diagnosis:  Iron deficiency anemia Hypotestosteronemia Chronic liver lesions Pernicious anemia  Of Note: History of stroke in June 2018, no more Testosterone or ESA's  Current Therapy:        IV iron as indicated  Vit B12 1 mg IM q month   Interim History:  James Zuniga is here today for follow-up. He is doing well and has no complaints at this time.  His energy is good today and SOB with exertion minimal.  He has not noted any episodes of bleeding. No bruising or petechiae.  Hgb is stable at 11.2, MCV 87 and platelet count 374.  B 12 last visit (March) was 416.  No fever, chills, n/v, cough, rash, dizziness, SOB, chest pain, palpitations, abdominal pain or changes in bowel or bladder habits.  He takes his lasix 3 days a week and the swelling in his lower extremities is stable at this time. Pedal pulses 2+.  He has intermittent numbness and tingling in his hands and feet.  No falls or syncopal episodes to report. He ambulates with a can for added support.  He states that he has a good appetite and is staying properly hydrated. Her weight is stable.   ECOG Performance Status: 1 - Symptomatic but completely ambulatory  Medications:  Allergies as of 11/21/2019   No Known Allergies     Medication List       Accurate as of Nov 21, 2019  2:10 PM. If you have any questions, ask your nurse or doctor.        albuterol 108 (90 Base) MCG/ACT inhaler Commonly known as: VENTOLIN HFA Inhale 2 puffs into the lungs every 6 (six) hours as needed for wheezing or shortness of breath.   allopurinol 300 MG tablet Commonly known as: ZYLOPRIM Take 300 mg by mouth daily.   amLODipine 5 MG tablet Commonly known as: NORVASC Take 5 mg by mouth daily.   aspirin 81 MG EC tablet Take 81 mg by mouth daily. Swallow whole.   atorvastatin 80 MG tablet Commonly known as: LIPITOR Take 1  tablet (80 mg total) by mouth every morning.   Basaglar KwikPen 100 UNIT/ML Inject 60 Units into the skin daily.   clopidogrel 75 MG tablet Commonly known as: PLAVIX Take 1 tablet (75 mg total) by mouth daily.   diclofenac sodium 1 % Gel Commonly known as: VOLTAREN Use 4 grams to knees tid prn   DULoxetine 60 MG capsule Commonly known as: CYMBALTA Take 60 mg by mouth 2 (two) times daily.   Fifty50 Glucose Meter 2.0 w/Device Kit To check blood sugars TID or Use as instructed Include strips. Lancets, lancet device, control solution. batteries   Fifty50 Pen Needles 32G X 4 MM Misc Generic drug: Insulin Pen Needle 1 each by Misc.(Non-Drug; Combo Route) route daily.   furosemide 20 MG tablet Commonly known as: LASIX Take 20 mg by mouth every Tuesday, Thursday, and Saturday at 6 PM.   gabapentin 300 MG capsule Commonly known as: NEURONTIN Take 300 mg by mouth 4 (four) times daily.   insulin NPH-regular Human (70-30) 100 UNIT/ML injection Commonly known as: NovoLIN 70/30 Please inject 25 units twice daily before breakfast and dinner. What changed:   how much to take  how to take this  when to take this  additional instructions   insulin regular 100 units/mL injection Commonly known as: NOVOLIN R Use as sliding  scale up to 10 units tid with meals   ipratropium-albuterol 0.5-2.5 (3) MG/3ML Soln Commonly known as: DUONEB USE 1 VIAL IN NEBULIZER 4 TIMES DAILY   losartan 100 MG tablet Commonly known as: COZAAR Take 100 mg by mouth daily.   metoprolol succinate 25 MG 24 hr tablet Commonly known as: TOPROL-XL Take 1 tablet (25 mg total) by mouth daily. What changed:   how much to take  when to take this   mupirocin ointment 2 % Commonly known as: BACTROBAN APPLY OINTMENT TOPICALLY TO AFFECTED AREA(S) TWICE DAILY AS NEEDED   nystatin cream Commonly known as: MYCOSTATIN Use bid to affected area x 10 days, then prn   onetouch ultrasoft lancets USE 1 LANCET TO  CHECK GLUCOSE IN THE MORNING BEFORE BREAKFAST   pantoprazole 20 MG tablet Commonly known as: PROTONIX Take 20 mg by mouth daily.   potassium chloride SA 20 MEQ tablet Commonly known as: KLOR-CON Take 1 tablet (20 mEq total) by mouth 2 (two) times daily. What changed:   when to take this  additional instructions   Precision QID Test test strip Generic drug: glucose blood 1 each by Misc.(Non-Drug; Combo Route) route daily.   primidone 50 MG tablet Commonly known as: MYSOLINE Take 100 mg by mouth at bedtime.   sildenafil 100 MG tablet Commonly known as: VIAGRA Take by mouth.   spironolactone 25 MG tablet Commonly known as: ALDACTONE Take 25 mg by mouth daily.   tamsulosin 0.4 MG Caps capsule Commonly known as: FLOMAX Take 1 capsule (0.4 mg total) by mouth daily after breakfast.   tiZANidine 4 MG tablet Commonly known as: ZANAFLEX Take 4 mg by mouth 3 (three) times daily as needed for muscle spasms.   traMADol 50 MG tablet Commonly known as: ULTRAM Take 50 mg by mouth daily as needed for moderate pain.   Tyler Aas FlexTouch 200 UNIT/ML FlexTouch Pen Generic drug: insulin degludec 60 units once daily per patient.   trimethoprim-polymyxin b ophthalmic solution Commonly known as: POLYTRIM INSTILL 1 DROP INTO LEFT EYE 4 TIMES DAILY FOR 5 DAYS   VITAMIN B-12 IJ Inject 1,000 mg as directed every 30 (thirty) days. X 4   zolpidem 10 MG tablet Commonly known as: AMBIEN Take 10 mg by mouth at bedtime.       Allergies: No Known Allergies  Past Medical History, Surgical history, Social history, and Family History were reviewed and updated.  Review of Systems: All other 10 point review of systems is negative.   Physical Exam:  height is 5' 6"  (1.676 m) and weight is 243 lb (110.2 kg). His temporal temperature is 97.3 F (36.3 C) (abnormal). His blood pressure is 140/90 and his pulse is 70. His respiration is 18 and oxygen saturation is 98%.   Wt Readings from Last  3 Encounters:  11/21/19 243 lb (110.2 kg)  09/21/19 237 lb (107.5 kg)  09/08/19 236 lb 9.6 oz (107.3 kg)    Ocular: Sclerae unicteric, pupils equal, round and reactive to light Ear-nose-throat: Oropharynx clear, dentition fair Lymphatic: No cervical or supraclavicular adenopathy Lungs no rales or rhonchi, good excursion bilaterally Heart regular rate and rhythm, no murmur appreciated Abd soft, nontender, positive bowel sounds, no liver or spleen tip palpated on exam, no fluid wave  MSK no focal spinal tenderness, no joint edema Neuro: non-focal, well-oriented, appropriate affect Breasts: Deferred   Lab Results  Component Value Date   WBC 6.4 11/21/2019   HGB 11.2 (L) 11/21/2019   HCT 37.4 (L) 11/21/2019  MCV 87.4 11/21/2019   PLT 374 11/21/2019   Lab Results  Component Value Date   FERRITIN 96 09/21/2019   IRON 39 (L) 09/21/2019   TIBC 333 09/21/2019   UIBC 294 09/21/2019   IRONPCTSAT 12 (L) 09/21/2019   Lab Results  Component Value Date   RETICCTPCT 1.5 12/19/2018   RBC 4.28 11/21/2019   RETICCTABS 63.0 06/20/2015   No results found for: [facility] Lab Results  Component Value Date   IGA 139 08/26/2017   No results found for: Odetta Pink, SPEI   Chemistry      Component Value Date/Time   NA 139 11/21/2019 1307   NA 141 09/25/2016 0809   K 4.1 11/21/2019 1307   K 2.8 (LL) 09/25/2016 0809   CL 102 11/21/2019 1307   CL 102 07/31/2016 0815   CO2 29 11/21/2019 1307   CO2 28 09/25/2016 0809   BUN 17 11/21/2019 1307   BUN 13.1 09/25/2016 0809   CREATININE 1.34 (H) 11/21/2019 1307   CREATININE 1.2 09/25/2016 0809      Component Value Date/Time   CALCIUM 10.7 (H) 11/21/2019 1307   CALCIUM 9.5 09/25/2016 0809   ALKPHOS 98 11/21/2019 1307   ALKPHOS 126 09/25/2016 0809   AST 21 11/21/2019 1307   AST 19 09/25/2016 0809   ALT 23 11/21/2019 1307   ALT 23 09/25/2016 0809   BILITOT  0.3 11/21/2019 1307   BILITOT 0.45 09/25/2016 0809       Impression and Plan: James Zuniga is a very pleasant 64 yo African American gentleman withmultifactorial anemia. B 12 injection given today.  Iron studies are pending. We will replace if needed.  We will see him in another 2 months. He will contact our office with any questions or concerns. We can certainly see him sooner if needed.   Laverna Peace, NP 5/11/20212:10 PM

## 2019-11-21 NOTE — Patient Instructions (Signed)

## 2019-11-21 NOTE — Telephone Encounter (Signed)
Advised patient of appointments added and calendar was mailed as well per 5/11 los

## 2019-11-22 LAB — IRON AND TIBC
Iron: 49 ug/dL (ref 42–163)
Saturation Ratios: 15 % — ABNORMAL LOW (ref 20–55)
TIBC: 324 ug/dL (ref 202–409)
UIBC: 275 ug/dL (ref 117–376)

## 2019-11-22 LAB — FERRITIN: Ferritin: 171 ng/mL (ref 24–336)

## 2019-11-27 ENCOUNTER — Encounter: Payer: Self-pay | Admitting: Pulmonary Disease

## 2019-11-27 ENCOUNTER — Other Ambulatory Visit: Payer: Self-pay

## 2019-11-27 ENCOUNTER — Ambulatory Visit: Payer: Medicare Other | Admitting: Pulmonary Disease

## 2019-11-27 VITALS — BP 116/64 | HR 71 | Temp 97.6°F | Ht 66.0 in | Wt 244.0 lb

## 2019-11-27 DIAGNOSIS — J438 Other emphysema: Secondary | ICD-10-CM | POA: Diagnosis not present

## 2019-11-27 DIAGNOSIS — G4733 Obstructive sleep apnea (adult) (pediatric): Secondary | ICD-10-CM | POA: Diagnosis not present

## 2019-11-27 DIAGNOSIS — J449 Chronic obstructive pulmonary disease, unspecified: Secondary | ICD-10-CM

## 2019-11-27 MED ORDER — ANORO ELLIPTA 62.5-25 MCG/INH IN AEPB
1.0000 | INHALATION_SPRAY | Freq: Every day | RESPIRATORY_TRACT | 5 refills | Status: DC
Start: 2019-11-27 — End: 2021-04-02

## 2019-11-27 NOTE — Patient Instructions (Signed)
Anoro one puff daily  Follow up in 6 months

## 2019-11-27 NOTE — Progress Notes (Signed)
[facility]  Chief Complaint  Patient presents with  . Follow-up    Constitutional:  BP 116/64 (BP Location: Left Arm, Cuff Size: Normal)   Pulse 71   Temp 97.6 F (36.4 C) (Temporal)   Ht _0  (1.676 m)   Wt 244 lb (110.7 kg)   SpO2 100% Comment: RA  BMI 39.38 kg/m   Past Medical History:  CVA, ETOH, OA, DM, GERD, CHF, HLD, Gout, HTN, Low T, Vit D deficiency, Iron deficiency anemia  Brief Summary:  James Zuniga is a 64 y.o. male former smoker with obstructive sleep apnea and emphysema.  Subjective:  Doing well with CPAP.  Had trouble with mask leak.  Better since he changed to full face mask.  Sleeping better and feels more energy during the day.  Breathing okay at rest.  Gets winded when he is walking.  Occasional cough.  Got Moderna COVID vaccine.  Physical Exam:   Appearance - well kempt   ENMT - no sinus tenderness, no oral exudate, no LAN, Mallampati 3 airway, no stridor, wears dentures  Respiratory - equal breath sounds bilaterally, no wheezing or rales  CV - s1s2 regular rate and rhythm, no murmurs  Ext - no clubbing, no edema  Skin - no rashes  Psych - normal mood and affect  Plan:   Obstructive sleep apnea. - he is compliant with CPAP and reports benefit - air leak better since he changed to full face mask - continue CPAP 11 cm H2O  COPD with emphysema. - he has financial assistance program for meds - will try setting him up for anoro - continue prn albuterol  Chronic systolic CHF. - followed by Dr. Otho Perl with cardiology and [facility]  Dyspnea on exertion. - from CHF, COPD/emphysema, and deconditioning - encouraged him to keep up a regular exercise regimen  Follow up:   Patient Instructions  Anoro one puff daily  Follow up in 6 months    Signature:  Chesley Mires, MD [place] Pager: [phone] 11/27/2019, 10:46 AM  Flow Sheet     Pulmonary tests:  PFT  01/27/13 >> FEV1 2.50 (84%), FEV1% 83, TLC 4.06 (61%), DLCO 60%, no BD  Chest imaging:  CT chest 03/02/13 >> mild paraseptal emphysema, 4 mm RUL nodule CT chest 07/26/15 >> stable RUL nodule  Sleep tests:  PSG 04/21/19 >> AHI 15.5, SpO2 low 83% CPAP titration 05/17/19 >> CPAP 11 cm H2O CPAP 10/25/19 to 11/23/19 >> used on 29 of 30 nights with average 6 hrs 49 min.  Average AHI 3.8 with CPAP 11 cm H2O.  Air leak.  Cardiac tests:  Echo 12/25/16 >> EF 40 to 45%, PAS 33 mmHg, mild MR  Medications:   Allergies as of 11/27/2019   No Known Allergies     Medication List       Accurate as of Nov 27, 2019 10:46 AM. If you have any questions, ask your nurse or doctor.        STOP taking these medications   nystatin cream Commonly known as: MYCOSTATIN Stopped by: Chesley Mires, MD     TAKE these medications   albuterol 108 (90 Base) MCG/ACT inhaler Commonly known as: VENTOLIN HFA Inhale 2 puffs into the lungs every 6 (six) hours as needed for wheezing or shortness of breath.   allopurinol 300 MG tablet Commonly known as: ZYLOPRIM Take 300 mg by mouth daily.   amLODipine 5 MG tablet Commonly known as: NORVASC  Take 5 mg by mouth daily.   Anoro Ellipta 62.5-25 MCG/INH Aepb Generic drug: umeclidinium-vilanterol Inhale 1 puff into the lungs daily. Started by: Chesley Mires, MD   aspirin 81 MG EC tablet Take 81 mg by mouth daily. Swallow whole.   atorvastatin 80 MG tablet Commonly known as: LIPITOR Take 1 tablet (80 mg total) by mouth every morning.   Basaglar KwikPen 100 UNIT/ML Inject 60 Units into the skin daily.   clopidogrel 75 MG tablet Commonly known as: PLAVIX Take 1 tablet (75 mg total) by mouth daily.   diclofenac sodium 1 % Gel Commonly known as: VOLTAREN Use 4 grams to knees tid prn   DULoxetine 60 MG capsule Commonly known as: CYMBALTA Take 60 mg by mouth 2 (two) times daily.   Fifty50 Glucose Meter 2.0 w/Device Kit To check blood sugars TID or Use as  instructed Include strips. Lancets, lancet device, control solution. batteries   Fifty50 Pen Needles 32G X 4 MM Misc Generic drug: Insulin Pen Needle 1 each by Misc.(Non-Drug; Combo Route) route daily.   furosemide 20 MG tablet Commonly known as: LASIX Take 20 mg by mouth every Tuesday, Thursday, and Saturday at 6 PM.   gabapentin 300 MG capsule Commonly known as: NEURONTIN Take 300 mg by mouth 4 (four) times daily.   insulin NPH-regular Human (70-30) 100 UNIT/ML injection Commonly known as: NovoLIN 70/30 Please inject 25 units twice daily before breakfast and dinner. What changed:   how much to take  how to take this  when to take this  additional instructions   insulin regular 100 units/mL injection Commonly known as: NOVOLIN R Use as sliding scale up to 10 units tid with meals   ipratropium-albuterol 0.5-2.5 (3) MG/3ML Soln Commonly known as: DUONEB USE 1 VIAL IN NEBULIZER 4 TIMES DAILY   losartan 100 MG tablet Commonly known as: COZAAR Take 100 mg by mouth daily.   metoprolol succinate 25 MG 24 hr tablet Commonly known as: TOPROL-XL Take 1 tablet (25 mg total) by mouth daily. What changed:   how much to take  when to take this   mupirocin ointment 2 % Commonly known as: BACTROBAN APPLY OINTMENT TOPICALLY TO AFFECTED AREA(S) TWICE DAILY AS NEEDED   onetouch ultrasoft lancets USE 1 LANCET TO CHECK GLUCOSE IN THE MORNING BEFORE BREAKFAST   pantoprazole 20 MG tablet Commonly known as: PROTONIX Take 20 mg by mouth daily.   potassium chloride SA 20 MEQ tablet Commonly known as: KLOR-CON Take 1 tablet (20 mEq total) by mouth 2 (two) times daily. What changed:   when to take this  additional instructions   Precision QID Test test strip Generic drug: glucose blood 1 each by Misc.(Non-Drug; Combo Route) route daily.   primidone 50 MG tablet Commonly known as: MYSOLINE Take 100 mg by mouth at bedtime.   sildenafil 100 MG tablet Commonly known as:  VIAGRA Take by mouth.   spironolactone 25 MG tablet Commonly known as: ALDACTONE Take 25 mg by mouth daily.   tamsulosin 0.4 MG Caps capsule Commonly known as: FLOMAX Take 1 capsule (0.4 mg total) by mouth daily after breakfast.   tiZANidine 4 MG tablet Commonly known as: ZANAFLEX Take 4 mg by mouth 3 (three) times daily as needed for muscle spasms.   traMADol 50 MG tablet Commonly known as: ULTRAM Take 50 mg by mouth daily as needed for moderate pain.   Tyler Aas FlexTouch 200 UNIT/ML FlexTouch Pen Generic drug: insulin degludec 60 units once daily per patient.  trimethoprim-polymyxin b ophthalmic solution Commonly known as: POLYTRIM INSTILL 1 DROP INTO LEFT EYE 4 TIMES DAILY FOR 5 DAYS   VITAMIN B-12 IJ Inject 1,000 mg as directed every 30 (thirty) days. X 4   zolpidem 10 MG tablet Commonly known as: AMBIEN Take 10 mg by mouth at bedtime.       Past Surgical History:  He  has a past surgical history that includes Knee arthroscopy (Left); TEE without cardioversion (N/A, 12/24/2016); Loop recorder implant (06/2017); Esophagogastroduodenoscopy (N/A, 08/26/2017); Colonoscopy (N/A, 08/26/2017); enteroscopy (N/A, 05/24/2018); Hot hemostasis (N/A, 05/24/2018); and polypectomy (05/24/2018).  Family History:  His family history includes Diabetes in his father and sister; Heart attack in his brother and brother; Heart disease in his mother; Stroke in his brother and paternal uncle.  Social History:  He  reports that he quit smoking about 2 years ago. His smoking use included cigarettes. He started smoking about 36 years ago. He has a 20.00 pack-year smoking history. He has never used smokeless tobacco. He reports that he does not drink alcohol or use drugs.

## 2019-11-28 ENCOUNTER — Ambulatory Visit: Payer: Medicare Other

## 2019-11-28 ENCOUNTER — Inpatient Hospital Stay: Payer: Medicare Other

## 2019-11-28 ENCOUNTER — Other Ambulatory Visit: Payer: Self-pay

## 2019-11-28 VITALS — BP 105/74 | HR 61 | Temp 97.1°F | Resp 18

## 2019-11-28 DIAGNOSIS — D509 Iron deficiency anemia, unspecified: Secondary | ICD-10-CM | POA: Diagnosis not present

## 2019-11-28 MED ORDER — SODIUM CHLORIDE 0.9 % IV SOLN
200.0000 mg | Freq: Once | INTRAVENOUS | Status: AC
  Administered 2019-11-28: 200 mg via INTRAVENOUS
  Filled 2019-11-28: qty 200

## 2019-11-28 MED ORDER — SODIUM CHLORIDE 0.9 % IV SOLN
INTRAVENOUS | Status: DC
  Filled 2019-11-28 (×2): qty 250

## 2019-11-28 NOTE — Progress Notes (Signed)
Declined to stay for 30 min observation

## 2019-11-28 NOTE — Patient Instructions (Signed)

## 2019-11-30 ENCOUNTER — Inpatient Hospital Stay: Payer: Medicare Other

## 2019-11-30 ENCOUNTER — Other Ambulatory Visit: Payer: Self-pay | Admitting: Family

## 2019-11-30 ENCOUNTER — Other Ambulatory Visit: Payer: Self-pay

## 2019-11-30 VITALS — BP 121/83 | HR 69 | Temp 97.5°F | Resp 18

## 2019-11-30 DIAGNOSIS — D509 Iron deficiency anemia, unspecified: Secondary | ICD-10-CM

## 2019-11-30 MED ORDER — SODIUM CHLORIDE 0.9 % IV SOLN
INTRAVENOUS | Status: DC
  Filled 2019-11-30: qty 250

## 2019-11-30 MED ORDER — SODIUM CHLORIDE 0.9 % IV SOLN
200.0000 mg | Freq: Once | INTRAVENOUS | Status: AC
  Administered 2019-11-30: 200 mg via INTRAVENOUS
  Filled 2019-11-30: qty 200

## 2019-11-30 NOTE — Progress Notes (Signed)
Patient declined to stay for observation. Feels well and VS taken

## 2019-12-01 ENCOUNTER — Ambulatory Visit: Payer: Medicare Other

## 2019-12-01 ENCOUNTER — Inpatient Hospital Stay: Payer: Medicare Other

## 2019-12-04 ENCOUNTER — Inpatient Hospital Stay: Payer: Medicare Other

## 2019-12-04 ENCOUNTER — Other Ambulatory Visit: Payer: Self-pay

## 2019-12-04 VITALS — BP 119/80 | HR 68 | Temp 97.6°F | Resp 17

## 2019-12-04 DIAGNOSIS — D509 Iron deficiency anemia, unspecified: Secondary | ICD-10-CM

## 2019-12-04 MED ORDER — SODIUM CHLORIDE 0.9 % IV SOLN
200.0000 mg | Freq: Once | INTRAVENOUS | Status: AC
  Administered 2019-12-04: 200 mg via INTRAVENOUS
  Filled 2019-12-04: qty 200

## 2019-12-08 ENCOUNTER — Inpatient Hospital Stay: Payer: Medicare Other

## 2019-12-08 ENCOUNTER — Other Ambulatory Visit: Payer: Self-pay

## 2019-12-08 VITALS — BP 124/84 | HR 86 | Temp 96.8°F | Resp 17

## 2019-12-08 DIAGNOSIS — D509 Iron deficiency anemia, unspecified: Secondary | ICD-10-CM | POA: Diagnosis not present

## 2019-12-08 MED ORDER — SODIUM CHLORIDE 0.9 % IV SOLN
200.0000 mg | Freq: Once | INTRAVENOUS | Status: AC
  Administered 2019-12-08: 200 mg via INTRAVENOUS
  Filled 2019-12-08: qty 200

## 2019-12-08 MED ORDER — CYANOCOBALAMIN 1000 MCG/ML IJ SOLN: 1000.0000 ug | Freq: Once | INTRAMUSCULAR | Status: DC

## 2019-12-08 MED ORDER — SODIUM CHLORIDE 0.9 % IV SOLN
Freq: Once | INTRAVENOUS | Status: AC
  Filled 2019-12-08: qty 250

## 2019-12-08 NOTE — Progress Notes (Signed)
Pt declined to stay for 30 minute post infusion observation period. Pt stated he has tolerated medication before without incident. Pt aware to call clinic with any concerns or seek emergency care if needed. Pt verbalized understanding and had no further questions.

## 2019-12-08 NOTE — Patient Instructions (Signed)

## 2020-01-22 ENCOUNTER — Inpatient Hospital Stay: Payer: Medicare Other | Attending: Family

## 2020-01-22 ENCOUNTER — Telehealth: Payer: Self-pay | Admitting: Family

## 2020-01-22 ENCOUNTER — Inpatient Hospital Stay: Payer: Medicare Other

## 2020-01-22 ENCOUNTER — Inpatient Hospital Stay (HOSPITAL_BASED_OUTPATIENT_CLINIC_OR_DEPARTMENT_OTHER): Payer: Medicare Other | Admitting: Family

## 2020-01-22 ENCOUNTER — Other Ambulatory Visit: Payer: Self-pay

## 2020-01-22 VITALS — BP 87/56 | HR 75 | Temp 98.1°F | Resp 18 | Wt 245.1 lb

## 2020-01-22 DIAGNOSIS — D509 Iron deficiency anemia, unspecified: Secondary | ICD-10-CM | POA: Diagnosis present

## 2020-01-22 DIAGNOSIS — D510 Vitamin B12 deficiency anemia due to intrinsic factor deficiency: Secondary | ICD-10-CM | POA: Insufficient documentation

## 2020-01-22 DIAGNOSIS — D500 Iron deficiency anemia secondary to blood loss (chronic): Secondary | ICD-10-CM

## 2020-01-22 LAB — CBC WITH DIFFERENTIAL (CANCER CENTER ONLY)
Abs Immature Granulocytes: 0.02 10*3/uL (ref 0.00–0.07)
Basophils Absolute: 0 10*3/uL (ref 0.0–0.1)
Basophils Relative: 1 %
Eosinophils Absolute: 0.2 10*3/uL (ref 0.0–0.5)
Eosinophils Relative: 4 %
HCT: 32.6 % — ABNORMAL LOW (ref 39.0–52.0)
Hemoglobin: 10.1 g/dL — ABNORMAL LOW (ref 13.0–17.0)
Immature Granulocytes: 0 %
Lymphocytes Relative: 29 %
Lymphs Abs: 1.5 10*3/uL (ref 0.7–4.0)
MCH: 27 pg (ref 26.0–34.0)
MCHC: 31 g/dL (ref 30.0–36.0)
MCV: 87.2 fL (ref 80.0–100.0)
Monocytes Absolute: 0.3 10*3/uL (ref 0.1–1.0)
Monocytes Relative: 6 %
Neutro Abs: 3.2 10*3/uL (ref 1.7–7.7)
Neutrophils Relative %: 60 %
Platelet Count: 325 10*3/uL (ref 150–400)
RBC: 3.74 MIL/uL — ABNORMAL LOW (ref 4.22–5.81)
RDW: 14.9 % (ref 11.5–15.5)
WBC Count: 5.3 10*3/uL (ref 4.0–10.5)
nRBC: 0 % (ref 0.0–0.2)

## 2020-01-22 LAB — CMP (CANCER CENTER ONLY)
ALT: 20 U/L (ref 0–44)
AST: 16 U/L (ref 15–41)
Albumin: 4.2 g/dL (ref 3.5–5.0)
Alkaline Phosphatase: 86 U/L (ref 38–126)
Anion gap: 7 (ref 5–15)
BUN: 21 mg/dL (ref 8–23)
CO2: 28 mmol/L (ref 22–32)
Calcium: 10.5 mg/dL — ABNORMAL HIGH (ref 8.9–10.3)
Chloride: 100 mmol/L (ref 98–111)
Creatinine: 1.85 mg/dL — ABNORMAL HIGH (ref 0.61–1.24)
GFR, Est AFR Am: 44 mL/min — ABNORMAL LOW (ref >60)
GFR, Estimated: 38 mL/min — ABNORMAL LOW (ref >60)
Glucose, Bld: 207 mg/dL — ABNORMAL HIGH (ref 70–99)
Potassium: 4.7 mmol/L (ref 3.5–5.1)
Sodium: 135 mmol/L (ref 135–145)
Total Bilirubin: 0.3 mg/dL (ref 0.3–1.2)
Total Protein: 7.5 g/dL (ref 6.5–8.1)

## 2020-01-22 LAB — VITAMIN B12: Vitamin B-12: 529 pg/mL (ref 180–914)

## 2020-01-22 MED ORDER — HEPARIN SOD (PORK) LOCK FLUSH 100 UNIT/ML IV SOLN
500.0000 [IU] | Freq: Once | INTRAVENOUS | Status: DC | PRN
  Filled 2020-01-22: qty 5

## 2020-01-22 MED ORDER — SODIUM CHLORIDE 0.9% FLUSH
10.0000 mL | Freq: Once | INTRAVENOUS | Status: DC | PRN
  Filled 2020-01-22: qty 10

## 2020-01-22 MED ORDER — CYANOCOBALAMIN 1000 MCG/ML IJ SOLN
INTRAMUSCULAR | Status: AC
  Filled 2020-01-22: qty 1

## 2020-01-22 MED ORDER — ALTEPLASE 2 MG IJ SOLR
2.0000 mg | Freq: Once | INTRAMUSCULAR | Status: DC | PRN
  Filled 2020-01-22: qty 2

## 2020-01-22 MED ORDER — CYANOCOBALAMIN 1000 MCG/ML IJ SOLN
1000.0000 ug | Freq: Once | INTRAMUSCULAR | Status: AC
  Administered 2020-01-22: 1000 ug via INTRAMUSCULAR

## 2020-01-22 MED ORDER — HEPARIN SOD (PORK) LOCK FLUSH 100 UNIT/ML IV SOLN
250.0000 [IU] | Freq: Once | INTRAVENOUS | Status: DC | PRN
  Filled 2020-01-22: qty 5

## 2020-01-22 MED ORDER — SODIUM CHLORIDE 0.9% FLUSH
3.0000 mL | Freq: Once | INTRAVENOUS | Status: DC | PRN
  Filled 2020-01-22: qty 10

## 2020-01-22 NOTE — Progress Notes (Signed)
Hematology and Oncology Follow Up Visit  James Zuniga [id number] 07/07/1956 64 y.o. 01/22/2020   Principle Diagnosis:  Iron deficiency anemia Hypotestosteronemia Chronic liver lesions Pernicious anemia  Of Note: History of stroke in June 2018, no more Testosterone or ESA's  Current Therapy: IV iron as indicated  Vit B12 1 mg IM q month   Interim History:  James Zuniga is here today for follow-up and B 12 injection. He is noting some increased fatigue. He has had some dizziness.  He has SOB with over exertion and takes a break to rest when needed.  He has not noted any episodes of bleeding. No bruising or petechiae.  Hgb is 10.1, MCV 87, platelets 325 and WBC count 5.3.  No fever, chills, n/v, cough, rash,  chest pain, palpitations, abdominal pain or changes in bowel or bladder habits.  No swelling in his extremities at this time.  The numbness and tingling in his arms and lower extremities is stable/unchanged.  No recent falls to report. No syncopal episodes. He is ambulating with a cane for added support.  He is eating well and staying well hydrated. Her weight is stable.  He has had both his Covid vaccines.   ECOG Performance Status: 1 - Symptomatic but completely ambulatory  Medications:  Allergies as of 01/22/2020   No Known Allergies     Medication List       Accurate as of January 22, 2020  1:48 PM. If you have any questions, ask your nurse or doctor.        albuterol 108 (90 Base) MCG/ACT inhaler Commonly known as: VENTOLIN HFA Inhale 2 puffs into the lungs every 6 (six) hours as needed for wheezing or shortness of breath.   allopurinol 300 MG tablet Commonly known as: ZYLOPRIM Take 300 mg by mouth daily.   amLODipine 5 MG tablet Commonly known as: NORVASC Take 5 mg by mouth daily.   Anoro Ellipta 62.5-25 MCG/INH Aepb Generic drug: umeclidinium-vilanterol Inhale 1 puff into the lungs daily.   aspirin 81 MG EC tablet Take 81 mg by mouth daily.  Swallow whole.   atorvastatin 80 MG tablet Commonly known as: LIPITOR Take 1 tablet (80 mg total) by mouth every morning.   Basaglar KwikPen 100 UNIT/ML Inject 60 Units into the skin daily.   clopidogrel 75 MG tablet Commonly known as: PLAVIX Take 1 tablet (75 mg total) by mouth daily.   diclofenac sodium 1 % Gel Commonly known as: VOLTAREN Use 4 grams to knees tid prn   DULoxetine 60 MG capsule Commonly known as: CYMBALTA Take 60 mg by mouth 2 (two) times daily.   Fifty50 Glucose Meter 2.0 w/Device Kit To check blood sugars TID or Use as instructed Include strips. Lancets, lancet device, control solution. batteries   Fifty50 Pen Needles 32G X 4 MM Misc Generic drug: Insulin Pen Needle 1 each by Misc.(Non-Drug; Combo Route) route daily.   furosemide 20 MG tablet Commonly known as: LASIX Take 20 mg by mouth every Tuesday, Thursday, and Saturday at 6 PM.   gabapentin 300 MG capsule Commonly known as: NEURONTIN Take 300 mg by mouth 4 (four) times daily.   insulin NPH-regular Human (70-30) 100 UNIT/ML injection Commonly known as: NovoLIN 70/30 Please inject 25 units twice daily before breakfast and dinner. What changed:   how much to take  how to take this  when to take this  additional instructions   insulin regular 100 units/mL injection Commonly known as: NOVOLIN R Use as sliding  scale up to 10 units tid with meals   ipratropium-albuterol 0.5-2.5 (3) MG/3ML Soln Commonly known as: DUONEB USE 1 VIAL IN NEBULIZER 4 TIMES DAILY   losartan 100 MG tablet Commonly known as: COZAAR Take 100 mg by mouth daily.   metoprolol succinate 25 MG 24 hr tablet Commonly known as: TOPROL-XL Take 1 tablet (25 mg total) by mouth daily. What changed:   how much to take  when to take this   mupirocin ointment 2 % Commonly known as: BACTROBAN APPLY OINTMENT TOPICALLY TO AFFECTED AREA(S) TWICE DAILY AS NEEDED   onetouch ultrasoft lancets USE 1 LANCET TO CHECK GLUCOSE  IN THE MORNING BEFORE BREAKFAST   pantoprazole 20 MG tablet Commonly known as: PROTONIX Take 20 mg by mouth daily.   potassium chloride SA 20 MEQ tablet Commonly known as: KLOR-CON Take 1 tablet (20 mEq total) by mouth 2 (two) times daily. What changed:   when to take this  additional instructions   Precision QID Test test strip Generic drug: glucose blood 1 each by Misc.(Non-Drug; Combo Route) route daily.   primidone 50 MG tablet Commonly known as: MYSOLINE Take 100 mg by mouth at bedtime.   sildenafil 100 MG tablet Commonly known as: VIAGRA Take by mouth.   spironolactone 25 MG tablet Commonly known as: ALDACTONE Take 25 mg by mouth daily.   tamsulosin 0.4 MG Caps capsule Commonly known as: FLOMAX Take 1 capsule (0.4 mg total) by mouth daily after breakfast.   tiZANidine 4 MG tablet Commonly known as: ZANAFLEX Take 4 mg by mouth 3 (three) times daily as needed for muscle spasms.   traMADol 50 MG tablet Commonly known as: ULTRAM Take 50 mg by mouth daily as needed for moderate pain.   Tyler Aas FlexTouch 200 UNIT/ML FlexTouch Pen Generic drug: insulin degludec 60 units once daily per patient.   trimethoprim-polymyxin b ophthalmic solution Commonly known as: POLYTRIM INSTILL 1 DROP INTO LEFT EYE 4 TIMES DAILY FOR 5 DAYS   VITAMIN B-12 IJ Inject 1,000 mg as directed every 30 (thirty) days. X 4   zolpidem 10 MG tablet Commonly known as: AMBIEN Take 10 mg by mouth at bedtime.       Allergies: No Known Allergies  Past Medical History, Surgical history, Social history, and Family History were reviewed and updated.  Review of Systems: All other 10 point review of systems is negative.   Physical Exam:  weight is 245 lb 1.9 oz (111.2 kg). His temperature is 98.1 F (36.7 C). His blood pressure is 87/56 (abnormal) and his pulse is 75. His respiration is 18 and oxygen saturation is 97%.   Wt Readings from Last 3 Encounters:  01/22/20 245 lb 1.9 oz (111.2  kg)  11/27/19 244 lb (110.7 kg)  11/21/19 243 lb (110.2 kg)    Ocular: Sclerae unicteric, pupils equal, round and reactive to light Ear-nose-throat: Oropharynx clear, dentition fair Lymphatic: No cervical or supraclavicular adenopathy Lungs no rales or rhonchi, good excursion bilaterally Heart regular rate and rhythm, no murmur appreciated Abd soft, nontender, positive bowel sounds, no liver or spleen tip palpated on exam, no fluid wave  MSK no focal spinal tenderness, no joint edema Neuro: non-focal, well-oriented, appropriate affect Breasts: Deferred   Lab Results  Component Value Date   WBC 5.3 01/22/2020   HGB 10.1 (L) 01/22/2020   HCT 32.6 (L) 01/22/2020   MCV 87.2 01/22/2020   PLT 325 01/22/2020   Lab Results  Component Value Date   FERRITIN 171 11/21/2019  IRON 49 11/21/2019   TIBC 324 11/21/2019   UIBC 275 11/21/2019   IRONPCTSAT 15 (L) 11/21/2019   Lab Results  Component Value Date   RETICCTPCT 1.5 12/19/2018   RBC 3.74 (L) 01/22/2020   RETICCTABS 63.0 06/20/2015   No results found for: Nils Pyle [facility] Lab Results  Component Value Date   IGA 139 08/26/2017   No results found for: Odetta Pink, SPEI   Chemistry      Component Value Date/Time   NA 139 11/21/2019 1307   NA 141 09/25/2016 0809   K 4.1 11/21/2019 1307   K 2.8 (LL) 09/25/2016 0809   CL 102 11/21/2019 1307   CL 102 07/31/2016 0815   CO2 29 11/21/2019 1307   CO2 28 09/25/2016 0809   BUN 17 11/21/2019 1307   BUN 13.1 09/25/2016 0809   CREATININE 1.34 (H) 11/21/2019 1307   CREATININE 1.2 09/25/2016 0809      Component Value Date/Time   CALCIUM 10.7 (H) 11/21/2019 1307   CALCIUM 9.5 09/25/2016 0809   ALKPHOS 98 11/21/2019 1307   ALKPHOS 126 09/25/2016 0809   AST 21 11/21/2019 1307   AST 19 09/25/2016 0809   ALT 23 11/21/2019 1307   ALT 23 09/25/2016 0809   BILITOT 0.3 11/21/2019 1307   BILITOT 0.45  09/25/2016 0809       Impression and Plan: James Zuniga is a very pleasant 64yo African American gentleman withmultifactorial anemia. B12 injection was given today.  Iron studies are pending we will replace if needed.  We will see him in another 2 months. He can contact our office with any questions or concerns.   Laverna Peace, NP 7/12/20211:48 PM

## 2020-01-22 NOTE — Telephone Encounter (Signed)
Appointments scheduled calendar printed & mailed per 7/12 los °

## 2020-01-23 ENCOUNTER — Telehealth: Payer: Self-pay | Admitting: Family

## 2020-01-23 LAB — FERRITIN: Ferritin: 190 ng/mL (ref 24–336)

## 2020-01-23 LAB — IRON AND TIBC
Iron: 43 ug/dL (ref 42–163)
Saturation Ratios: 14 % — ABNORMAL LOW (ref 20–55)
TIBC: 299 ug/dL (ref 202–409)
UIBC: 256 ug/dL (ref 117–376)

## 2020-01-23 NOTE — Telephone Encounter (Signed)
Called and spoke with patient regarding appointments that have been added per 7/13 sch msg

## 2020-01-24 ENCOUNTER — Other Ambulatory Visit: Payer: Self-pay

## 2020-01-24 ENCOUNTER — Inpatient Hospital Stay: Payer: Medicare Other

## 2020-01-24 VITALS — BP 107/66 | HR 70 | Temp 98.5°F | Resp 18

## 2020-01-24 DIAGNOSIS — D509 Iron deficiency anemia, unspecified: Secondary | ICD-10-CM | POA: Diagnosis not present

## 2020-01-24 MED ORDER — SODIUM CHLORIDE 0.9 % IV SOLN
Freq: Once | INTRAVENOUS | Status: AC
  Filled 2020-01-24: qty 250

## 2020-01-24 MED ORDER — SODIUM CHLORIDE 0.9 % IV SOLN
200.0000 mg | Freq: Once | INTRAVENOUS | Status: AC
  Administered 2020-01-24: 200 mg via INTRAVENOUS
  Filled 2020-01-24: qty 200

## 2020-01-24 NOTE — Progress Notes (Signed)
Pt. Refused to wait 30 minutes post infusion. Released stable and ASX. 

## 2020-01-24 NOTE — Patient Instructions (Signed)

## 2020-01-29 ENCOUNTER — Other Ambulatory Visit: Payer: Self-pay

## 2020-01-29 ENCOUNTER — Inpatient Hospital Stay: Payer: Medicare Other

## 2020-01-29 VITALS — BP 107/73 | HR 64 | Temp 98.2°F | Resp 18

## 2020-01-29 DIAGNOSIS — D509 Iron deficiency anemia, unspecified: Secondary | ICD-10-CM

## 2020-01-29 MED ORDER — SODIUM CHLORIDE 0.9 % IV SOLN
Freq: Once | INTRAVENOUS | Status: AC
  Filled 2020-01-29: qty 250

## 2020-01-29 MED ORDER — SODIUM CHLORIDE 0.9 % IV SOLN
200.0000 mg | Freq: Once | INTRAVENOUS | Status: AC
  Administered 2020-01-29: 200 mg via INTRAVENOUS
  Filled 2020-01-29: qty 10

## 2020-01-29 NOTE — Patient Instructions (Signed)

## 2020-02-01 ENCOUNTER — Inpatient Hospital Stay: Payer: Medicare Other

## 2020-02-01 ENCOUNTER — Other Ambulatory Visit: Payer: Self-pay

## 2020-02-01 VITALS — BP 113/73 | HR 73 | Temp 98.9°F | Resp 18

## 2020-02-01 DIAGNOSIS — D509 Iron deficiency anemia, unspecified: Secondary | ICD-10-CM | POA: Diagnosis not present

## 2020-02-01 MED ORDER — SODIUM CHLORIDE 0.9 % IV SOLN
Freq: Once | INTRAVENOUS | Status: AC
  Filled 2020-02-01: qty 250

## 2020-02-01 MED ORDER — SODIUM CHLORIDE 0.9 % IV SOLN
200.0000 mg | Freq: Once | INTRAVENOUS | Status: AC
  Administered 2020-02-01: 200 mg via INTRAVENOUS
  Filled 2020-02-01: qty 10

## 2020-02-01 NOTE — Patient Instructions (Signed)

## 2020-02-05 ENCOUNTER — Other Ambulatory Visit: Payer: Self-pay

## 2020-02-05 ENCOUNTER — Inpatient Hospital Stay: Payer: Medicare Other

## 2020-02-05 VITALS — BP 107/70 | HR 65 | Temp 99.3°F | Resp 17

## 2020-02-05 DIAGNOSIS — D509 Iron deficiency anemia, unspecified: Secondary | ICD-10-CM

## 2020-02-05 MED ORDER — SODIUM CHLORIDE 0.9 % IV SOLN
200.0000 mg | Freq: Once | INTRAVENOUS | Status: AC
  Administered 2020-02-05: 200 mg via INTRAVENOUS
  Filled 2020-02-05: qty 200

## 2020-02-05 MED ORDER — SODIUM CHLORIDE 0.9 % IV SOLN
Freq: Once | INTRAVENOUS | Status: AC
  Filled 2020-02-05: qty 250

## 2020-02-05 NOTE — Progress Notes (Signed)
Patient declined to stay for the post infusion observation period. Patient denies any difficulty with this infusion in the past and is aware to call with any questions or concerns.   Pt verbalized understanding and had no further questions today.   

## 2020-02-05 NOTE — Patient Instructions (Signed)

## 2020-02-22 ENCOUNTER — Other Ambulatory Visit: Payer: Self-pay

## 2020-02-22 ENCOUNTER — Inpatient Hospital Stay: Payer: Medicare Other | Attending: Family

## 2020-02-22 VITALS — BP 138/85 | HR 81 | Temp 98.6°F | Resp 17

## 2020-02-22 DIAGNOSIS — D510 Vitamin B12 deficiency anemia due to intrinsic factor deficiency: Secondary | ICD-10-CM | POA: Insufficient documentation

## 2020-02-22 DIAGNOSIS — D509 Iron deficiency anemia, unspecified: Secondary | ICD-10-CM

## 2020-02-22 MED ORDER — CYANOCOBALAMIN 1000 MCG/ML IJ SOLN
1000.0000 ug | Freq: Once | INTRAMUSCULAR | Status: AC
  Administered 2020-02-22: 1000 ug via INTRAMUSCULAR

## 2020-02-22 NOTE — Patient Instructions (Signed)

## 2020-03-19 ENCOUNTER — Telehealth: Payer: Self-pay | Admitting: *Deleted

## 2020-03-19 NOTE — Telephone Encounter (Signed)
This nurse tried calling James Zuniga, spouse, but there was no answer. I left a message for her to call the office back.

## 2020-03-19 NOTE — Telephone Encounter (Signed)
Call received from patient's wife stating that pt has moved to [place], [place] and would like to know if she can come to pt.'s next appt with [place] NP.  Pt.'s wife instructed that she can come in to next visit with patient.  Note placed in patient's chart.

## 2020-03-25 ENCOUNTER — Inpatient Hospital Stay: Payer: Medicare Other | Attending: Family

## 2020-03-25 ENCOUNTER — Telehealth: Payer: Self-pay | Admitting: Family

## 2020-03-25 ENCOUNTER — Inpatient Hospital Stay: Payer: Medicare Other

## 2020-03-25 ENCOUNTER — Other Ambulatory Visit: Payer: Self-pay

## 2020-03-25 ENCOUNTER — Encounter: Payer: Self-pay | Admitting: Family

## 2020-03-25 ENCOUNTER — Inpatient Hospital Stay (HOSPITAL_BASED_OUTPATIENT_CLINIC_OR_DEPARTMENT_OTHER): Payer: Medicare Other | Admitting: Family

## 2020-03-25 VITALS — BP 102/53 | HR 64 | Temp 98.0°F | Resp 18 | Ht 66.0 in | Wt 241.8 lb

## 2020-03-25 DIAGNOSIS — D510 Vitamin B12 deficiency anemia due to intrinsic factor deficiency: Secondary | ICD-10-CM

## 2020-03-25 DIAGNOSIS — D500 Iron deficiency anemia secondary to blood loss (chronic): Secondary | ICD-10-CM | POA: Diagnosis not present

## 2020-03-25 DIAGNOSIS — D509 Iron deficiency anemia, unspecified: Secondary | ICD-10-CM

## 2020-03-25 DIAGNOSIS — D631 Anemia in chronic kidney disease: Secondary | ICD-10-CM | POA: Diagnosis not present

## 2020-03-25 LAB — VITAMIN B12: Vitamin B-12: 843 pg/mL (ref 180–914)

## 2020-03-25 LAB — CMP (CANCER CENTER ONLY)
ALT: 26 U/L (ref 0–44)
AST: 18 U/L (ref 15–41)
Albumin: 4.1 g/dL (ref 3.5–5.0)
Alkaline Phosphatase: 96 U/L (ref 38–126)
Anion gap: 8 (ref 5–15)
BUN: 13 mg/dL (ref 8–23)
CO2: 28 mmol/L (ref 22–32)
Calcium: 10.4 mg/dL — ABNORMAL HIGH (ref 8.9–10.3)
Chloride: 101 mmol/L (ref 98–111)
Creatinine: 1.5 mg/dL — ABNORMAL HIGH (ref 0.61–1.24)
GFR, Est AFR Am: 56 mL/min — ABNORMAL LOW (ref >60)
GFR, Estimated: 49 mL/min — ABNORMAL LOW (ref >60)
Glucose, Bld: 162 mg/dL — ABNORMAL HIGH (ref 70–99)
Potassium: 4.2 mmol/L (ref 3.5–5.1)
Sodium: 137 mmol/L (ref 135–145)
Total Bilirubin: 0.3 mg/dL (ref 0.3–1.2)
Total Protein: 7.6 g/dL (ref 6.5–8.1)

## 2020-03-25 LAB — CBC WITH DIFFERENTIAL (CANCER CENTER ONLY)
Abs Immature Granulocytes: 0.01 10*3/uL (ref 0.00–0.07)
Basophils Absolute: 0 10*3/uL (ref 0.0–0.1)
Basophils Relative: 1 %
Eosinophils Absolute: 0.2 10*3/uL (ref 0.0–0.5)
Eosinophils Relative: 4 %
HCT: 36.7 % — ABNORMAL LOW (ref 39.0–52.0)
Hemoglobin: 11.3 g/dL — ABNORMAL LOW (ref 13.0–17.0)
Immature Granulocytes: 0 %
Lymphocytes Relative: 31 %
Lymphs Abs: 1.7 10*3/uL (ref 0.7–4.0)
MCH: 26.6 pg (ref 26.0–34.0)
MCHC: 30.8 g/dL (ref 30.0–36.0)
MCV: 86.4 fL (ref 80.0–100.0)
Monocytes Absolute: 0.2 10*3/uL (ref 0.1–1.0)
Monocytes Relative: 4 %
Neutro Abs: 3.2 10*3/uL (ref 1.7–7.7)
Neutrophils Relative %: 60 %
Platelet Count: 325 10*3/uL (ref 150–400)
RBC: 4.25 MIL/uL (ref 4.22–5.81)
RDW: 14.7 % (ref 11.5–15.5)
WBC Count: 5.3 10*3/uL (ref 4.0–10.5)
nRBC: 0 % (ref 0.0–0.2)

## 2020-03-25 MED ORDER — CYANOCOBALAMIN 1000 MCG/ML IJ SOLN
1000.0000 ug | Freq: Once | INTRAMUSCULAR | Status: AC
  Administered 2020-03-25: 1000 ug via INTRAMUSCULAR

## 2020-03-25 MED ORDER — CYANOCOBALAMIN 1000 MCG/ML IJ SOLN
INTRAMUSCULAR | Status: AC
  Filled 2020-03-25: qty 1

## 2020-03-25 NOTE — Progress Notes (Signed)
Hematology and Oncology Follow Up Visit  James Zuniga [id number] Jul 26, 1955 64 y.o. 03/25/2020   Principle Diagnosis:  Iron deficiency anemia Hypotestosteronemia Chronic liver lesions Pernicious anemia  Of Note: History of stroke in June 2018, no more Testosterone or ESA's  Current Therapy: IV iron as indicated  Vit B12 1 mg IM q month   Interim History:  James Zuniga is here today with his wife for follow-up and B 12 injection. He is doing well but notes some fatigue at times as well as mild SOB with exertion.  He will take a break to rest as needed.  Hgb is up to 11.3, MCV 86, WBC count 5.3 and platelets 325.  No fever, chills, n/v, cough, rash, palpitations, abdominal pain or changes in bowel or bladder habits.  He denies any blood loss. No bruising or petechiae.  He has occasional swelling in his feet and ankles. This comes and goes. No pitting edema noted.  Pedal pulses are 2+.  The numbness and tingling in his extremities is stable. He notes this more on the right side with history of stroke.  He has balance issues and ambulates with a cane for added support. No falls or syncopal episodes to report.  He is eating well but admits that he needs to better hydrate throughout the day. His weight is stable. They have moved to [place], [place] and need a referral to [facility] cancer center. This is much closer to home for them.    ECOG Performance Status: 1 - Symptomatic but completely ambulatory  Medications:  Allergies as of 03/25/2020   No Known Allergies     Medication List       Accurate as of March 25, 2020  1:45 PM. If you have any questions, ask your nurse or doctor.        albuterol 108 (90 Base) MCG/ACT inhaler Commonly known as: VENTOLIN HFA Inhale 2 puffs into the lungs every 6 (six) hours as needed for wheezing or shortness of breath.   allopurinol 300 MG tablet Commonly known as: ZYLOPRIM Take 300 mg by mouth daily.   amLODipine 5 MG  tablet Commonly known as: NORVASC Take 5 mg by mouth daily.   Anoro Ellipta 62.5-25 MCG/INH Aepb Generic drug: umeclidinium-vilanterol Inhale 1 puff into the lungs daily.   aspirin 81 MG EC tablet Take 81 mg by mouth daily. Swallow whole.   atorvastatin 80 MG tablet Commonly known as: LIPITOR Take 1 tablet (80 mg total) by mouth every morning.   Basaglar KwikPen 100 UNIT/ML Inject 60 Units into the skin daily.   clopidogrel 75 MG tablet Commonly known as: PLAVIX Take 1 tablet (75 mg total) by mouth daily.   diclofenac sodium 1 % Gel Commonly known as: VOLTAREN Use 4 grams to knees tid prn   DULoxetine 60 MG capsule Commonly known as: CYMBALTA Take 60 mg by mouth 2 (two) times daily.   Fifty50 Glucose Meter 2.0 w/Device Kit To check blood sugars TID or Use as instructed Include strips. Lancets, lancet device, control solution. batteries   Fifty50 Pen Needles 32G X 4 MM Misc Generic drug: Insulin Pen Needle 1 each by Misc.(Non-Drug; Combo Route) route daily.   furosemide 20 MG tablet Commonly known as: LASIX Take 20 mg by mouth every Tuesday, Thursday, and Saturday at 6 PM.   gabapentin 300 MG capsule Commonly known as: NEURONTIN Take 300 mg by mouth 4 (four) times daily.   insulin NPH-regular Human (70-30) 100 UNIT/ML injection Commonly known as: NovoLIN  70/30 Please inject 25 units twice daily before breakfast and dinner. What changed:   how much to take  how to take this  when to take this  additional instructions   insulin regular 100 units/mL injection Commonly known as: NOVOLIN R Use as sliding scale up to 10 units tid with meals   ipratropium-albuterol 0.5-2.5 (3) MG/3ML Soln Commonly known as: DUONEB USE 1 VIAL IN NEBULIZER 4 TIMES DAILY   losartan 100 MG tablet Commonly known as: COZAAR Take 100 mg by mouth daily.   metoprolol succinate 25 MG 24 hr tablet Commonly known as: TOPROL-XL Take 1 tablet (25 mg total) by mouth daily. What  changed:   how much to take  when to take this   mupirocin ointment 2 % Commonly known as: BACTROBAN APPLY OINTMENT TOPICALLY TO AFFECTED AREA(S) TWICE DAILY AS NEEDED   onetouch ultrasoft lancets USE 1 LANCET TO CHECK GLUCOSE IN THE MORNING BEFORE BREAKFAST   pantoprazole 20 MG tablet Commonly known as: PROTONIX Take 20 mg by mouth daily.   potassium chloride SA 20 MEQ tablet Commonly known as: KLOR-CON Take 1 tablet (20 mEq total) by mouth 2 (two) times daily. What changed:   when to take this  additional instructions   Precision QID Test test strip Generic drug: glucose blood 1 each by Misc.(Non-Drug; Combo Route) route daily.   primidone 50 MG tablet Commonly known as: MYSOLINE Take 100 mg by mouth at bedtime.   sildenafil 100 MG tablet Commonly known as: VIAGRA Take by mouth.   spironolactone 25 MG tablet Commonly known as: ALDACTONE Take 25 mg by mouth daily.   tamsulosin 0.4 MG Caps capsule Commonly known as: FLOMAX Take 1 capsule (0.4 mg total) by mouth daily after breakfast.   tiZANidine 4 MG tablet Commonly known as: ZANAFLEX Take 4 mg by mouth 3 (three) times daily as needed for muscle spasms.   traMADol 50 MG tablet Commonly known as: ULTRAM Take 50 mg by mouth daily as needed for moderate pain.   Tresiba FlexTouch 200 UNIT/ML FlexTouch Pen Generic drug: insulin degludec 60 units once daily per patient.   trimethoprim-polymyxin b ophthalmic solution Commonly known as: POLYTRIM INSTILL 1 DROP INTO LEFT EYE 4 TIMES DAILY FOR 5 DAYS   VITAMIN B-12 IJ Inject 1,000 mg as directed every 30 (thirty) days. X 4   zolpidem 10 MG tablet Commonly known as: AMBIEN Take 10 mg by mouth at bedtime.       Allergies: No Known Allergies  Past Medical History, Surgical history, Social history, and Family History were reviewed and updated.  Review of Systems: All other 10 point review of systems is negative.   Physical Exam:  vitals were not  taken for this visit.   Wt Readings from Last 3 Encounters:  01/22/20 245 lb 1.9 oz (111.2 kg)  11/27/19 244 lb (110.7 kg)  11/21/19 243 lb (110.2 kg)    Ocular: Sclerae unicteric, pupils equal, round and reactive to light Ear-nose-throat: Oropharynx clear, dentition fair Lymphatic: No cervical or supraclavicular adenopathy Lungs no rales or rhonchi, good excursion bilaterally Heart regular rate and rhythm, no murmur appreciated Abd soft, nontender, positive bowel sounds MSK no focal spinal tenderness, no joint edema Neuro: non-focal, well-oriented, appropriate affect Breasts: Deferred   Lab Results  Component Value Date   WBC 5.3 03/25/2020   HGB 11.3 (L) 03/25/2020   HCT 36.7 (L) 03/25/2020   MCV 86.4 03/25/2020   PLT 325 03/25/2020   Lab Results  Component Value   Date   FERRITIN 190 01/22/2020   IRON 43 01/22/2020   TIBC 299 01/22/2020   UIBC 256 01/22/2020   IRONPCTSAT 14 (L) 01/22/2020   Lab Results  Component Value Date   RETICCTPCT 1.5 12/19/2018   RBC 4.25 03/25/2020   RETICCTABS 63.0 06/20/2015   No results found for: KPAFRELGTCHN, LAMBDASER, KAPLAMBRATIO Lab Results  Component Value Date   IGA 139 08/26/2017   No results found for: TOTALPROTELP, ALBUMINELP, A1GS, A2GS, BETS, BETA2SER, GAMS, MSPIKE, SPEI   Chemistry      Component Value Date/Time   NA 135 01/22/2020 1317   NA 141 09/25/2016 0809   K 4.7 01/22/2020 1317   K 2.8 (LL) 09/25/2016 0809   CL 100 01/22/2020 1317   CL 102 07/31/2016 0815   CO2 28 01/22/2020 1317   CO2 28 09/25/2016 0809   BUN 21 01/22/2020 1317   BUN 13.1 09/25/2016 0809   CREATININE 1.85 (H) 01/22/2020 1317   CREATININE 1.2 09/25/2016 0809      Component Value Date/Time   CALCIUM 10.5 (H) 01/22/2020 1317   CALCIUM 9.5 09/25/2016 0809   ALKPHOS 86 01/22/2020 1317   ALKPHOS 126 09/25/2016 0809   AST 16 01/22/2020 1317   AST 19 09/25/2016 0809   ALT 20 01/22/2020 1317   ALT 23 09/25/2016 0809   BILITOT 0.3  01/22/2020 1317   BILITOT 0.45 09/25/2016 0809       Impression and Plan: Mr. Ribeiro is a very pleasant 63yo African American gentleman withmultifactorial anemia. He received his B 12 injection today as planned.  We will see what his iron studies look like and replace if needed.  Referral placed with [facility] in [place], [place] per their request.  He can contact our office with any questions or concerns. We are certainly happy to see him again if needed.   Sarah [place], NP 9/13/20211:45 PM  

## 2020-03-25 NOTE — Telephone Encounter (Signed)
Per 9/13 los  Return for Referral.    Moved to [place], referral placed for heme/onc at [facility] per their request.

## 2020-03-25 NOTE — Patient Instructions (Signed)

## 2020-03-25 NOTE — Telephone Encounter (Signed)
Appointments scheduled calendar printed per 9/13 los

## 2020-03-26 LAB — IRON AND TIBC
Iron: 35 ug/dL — ABNORMAL LOW (ref 42–163)
Saturation Ratios: 11 % — ABNORMAL LOW (ref 20–55)
TIBC: 306 ug/dL (ref 202–409)
UIBC: 271 ug/dL (ref 117–376)

## 2020-03-26 LAB — FERRITIN: Ferritin: 164 ng/mL (ref 24–336)

## 2020-04-02 ENCOUNTER — Telehealth: Payer: Self-pay | Admitting: Family

## 2020-04-02 NOTE — Telephone Encounter (Signed)
I called and spoke with patient about his referral to [facility] in [place].  He was given their phone number to call and check on the status, and I advised him to call me back if they had any question or state they did not get this referral.  He voiced understanding of these instructions

## 2020-04-08 ENCOUNTER — Other Ambulatory Visit: Payer: Self-pay

## 2020-04-08 ENCOUNTER — Inpatient Hospital Stay: Payer: Medicare Other

## 2020-04-08 VITALS — BP 97/65 | HR 60 | Temp 97.9°F | Resp 17

## 2020-04-08 DIAGNOSIS — D510 Vitamin B12 deficiency anemia due to intrinsic factor deficiency: Secondary | ICD-10-CM | POA: Diagnosis not present

## 2020-04-08 DIAGNOSIS — D509 Iron deficiency anemia, unspecified: Secondary | ICD-10-CM

## 2020-04-08 MED ORDER — SODIUM CHLORIDE 0.9 % IV SOLN
200.0000 mg | Freq: Once | INTRAVENOUS | Status: AC
  Administered 2020-04-08: 200 mg via INTRAVENOUS
  Filled 2020-04-08: qty 200

## 2020-04-08 MED ORDER — SODIUM CHLORIDE 0.9 % IV SOLN
Freq: Once | INTRAVENOUS | Status: AC
  Filled 2020-04-08: qty 250

## 2020-04-08 NOTE — Patient Instructions (Signed)

## 2020-04-08 NOTE — Progress Notes (Signed)
Pt declined to stay for post infusion observation period. Pt stated he has tolerated medication multiple times prior without difficulty. Pt aware to call clinic with any questions or concerns. Pt verbalized understanding and had no further questions.   

## 2020-04-11 ENCOUNTER — Inpatient Hospital Stay: Payer: Medicare Other

## 2020-04-11 ENCOUNTER — Other Ambulatory Visit: Payer: Self-pay

## 2020-04-11 VITALS — BP 108/72 | HR 99 | Temp 97.9°F | Resp 18

## 2020-04-11 DIAGNOSIS — D509 Iron deficiency anemia, unspecified: Secondary | ICD-10-CM

## 2020-04-11 DIAGNOSIS — D510 Vitamin B12 deficiency anemia due to intrinsic factor deficiency: Secondary | ICD-10-CM | POA: Diagnosis not present

## 2020-04-11 MED ORDER — SODIUM CHLORIDE 0.9 % IV SOLN
Freq: Once | INTRAVENOUS | Status: AC
  Filled 2020-04-11: qty 250

## 2020-04-11 MED ORDER — SODIUM CHLORIDE 0.9 % IV SOLN
200.0000 mg | Freq: Once | INTRAVENOUS | Status: AC
  Administered 2020-04-11: 200 mg via INTRAVENOUS
  Filled 2020-04-11: qty 200

## 2020-04-11 NOTE — Progress Notes (Signed)
Patient declined to stay for the post infusion observation period. Patient denies any difficulty with this infusion in the past and is aware to call with any questions or concerns.   Pt verbalized understanding and had no further questions today.   

## 2020-04-11 NOTE — Patient Instructions (Signed)

## 2020-04-15 ENCOUNTER — Inpatient Hospital Stay: Payer: Medicare Other | Attending: Family

## 2020-04-15 ENCOUNTER — Other Ambulatory Visit: Payer: Self-pay

## 2020-04-15 VITALS — BP 113/67 | HR 78 | Temp 99.0°F | Resp 18

## 2020-04-15 DIAGNOSIS — D510 Vitamin B12 deficiency anemia due to intrinsic factor deficiency: Secondary | ICD-10-CM | POA: Diagnosis present

## 2020-04-15 DIAGNOSIS — D509 Iron deficiency anemia, unspecified: Secondary | ICD-10-CM | POA: Insufficient documentation

## 2020-04-15 MED ORDER — SODIUM CHLORIDE 0.9 % IV SOLN
Freq: Once | INTRAVENOUS | Status: AC
  Filled 2020-04-15: qty 250

## 2020-04-15 MED ORDER — SODIUM CHLORIDE 0.9% FLUSH
3.0000 mL | Freq: Once | INTRAVENOUS | Status: DC | PRN
  Filled 2020-04-15: qty 10

## 2020-04-15 MED ORDER — HEPARIN SOD (PORK) LOCK FLUSH 100 UNIT/ML IV SOLN
500.0000 [IU] | Freq: Once | INTRAVENOUS | Status: DC | PRN
  Filled 2020-04-15: qty 5

## 2020-04-15 MED ORDER — CYANOCOBALAMIN 1000 MCG/ML IJ SOLN
INTRAMUSCULAR | Status: AC
  Filled 2020-04-15: qty 1

## 2020-04-15 MED ORDER — SODIUM CHLORIDE 0.9 % IV SOLN
200.0000 mg | Freq: Once | INTRAVENOUS | Status: AC
  Administered 2020-04-15: 200 mg via INTRAVENOUS
  Filled 2020-04-15: qty 10

## 2020-04-15 MED ORDER — SODIUM CHLORIDE 0.9% FLUSH
10.0000 mL | Freq: Once | INTRAVENOUS | Status: DC | PRN
  Filled 2020-04-15: qty 10

## 2020-04-15 MED ORDER — HEPARIN SOD (PORK) LOCK FLUSH 100 UNIT/ML IV SOLN
250.0000 [IU] | Freq: Once | INTRAVENOUS | Status: DC | PRN
  Filled 2020-04-15: qty 5

## 2020-04-15 MED ORDER — ALTEPLASE 2 MG IJ SOLR
2.0000 mg | Freq: Once | INTRAMUSCULAR | Status: DC | PRN
  Filled 2020-04-15: qty 2

## 2020-04-15 MED ORDER — CYANOCOBALAMIN 1000 MCG/ML IJ SOLN
1000.0000 ug | Freq: Once | INTRAMUSCULAR | Status: AC
  Administered 2020-04-15: 1000 ug via INTRAMUSCULAR

## 2020-04-15 NOTE — Patient Instructions (Signed)

## 2020-04-24 ENCOUNTER — Ambulatory Visit: Payer: Medicare Other

## 2020-05-24 ENCOUNTER — Ambulatory Visit: Payer: Medicare Other | Admitting: Family

## 2020-05-24 ENCOUNTER — Other Ambulatory Visit: Payer: Medicare Other

## 2020-05-24 ENCOUNTER — Ambulatory Visit: Payer: Medicare Other

## 2020-05-27 ENCOUNTER — Telehealth: Payer: Self-pay

## 2020-05-27 ENCOUNTER — Inpatient Hospital Stay: Payer: Medicare Other

## 2020-05-27 ENCOUNTER — Inpatient Hospital Stay (HOSPITAL_BASED_OUTPATIENT_CLINIC_OR_DEPARTMENT_OTHER): Payer: Medicare Other | Admitting: Family

## 2020-05-27 ENCOUNTER — Encounter: Payer: Self-pay | Admitting: Family

## 2020-05-27 ENCOUNTER — Inpatient Hospital Stay: Payer: Medicare Other | Attending: Family

## 2020-05-27 ENCOUNTER — Other Ambulatory Visit: Payer: Self-pay

## 2020-05-27 VITALS — BP 106/60 | HR 77 | Temp 98.3°F | Resp 18 | Wt 240.8 lb

## 2020-05-27 VITALS — BP 94/57 | HR 77

## 2020-05-27 DIAGNOSIS — D631 Anemia in chronic kidney disease: Secondary | ICD-10-CM | POA: Diagnosis not present

## 2020-05-27 DIAGNOSIS — D510 Vitamin B12 deficiency anemia due to intrinsic factor deficiency: Secondary | ICD-10-CM

## 2020-05-27 DIAGNOSIS — D500 Iron deficiency anemia secondary to blood loss (chronic): Secondary | ICD-10-CM

## 2020-05-27 DIAGNOSIS — D509 Iron deficiency anemia, unspecified: Secondary | ICD-10-CM | POA: Diagnosis not present

## 2020-05-27 LAB — CBC WITH DIFFERENTIAL (CANCER CENTER ONLY)
Abs Immature Granulocytes: 0.09 10*3/uL — ABNORMAL HIGH (ref 0.00–0.07)
Basophils Absolute: 0.1 10*3/uL (ref 0.0–0.1)
Basophils Relative: 1 %
Eosinophils Absolute: 0.2 10*3/uL (ref 0.0–0.5)
Eosinophils Relative: 3 %
HCT: 32.9 % — ABNORMAL LOW (ref 39.0–52.0)
Hemoglobin: 9.8 g/dL — ABNORMAL LOW (ref 13.0–17.0)
Immature Granulocytes: 1 %
Lymphocytes Relative: 29 %
Lymphs Abs: 2.3 10*3/uL (ref 0.7–4.0)
MCH: 26.3 pg (ref 26.0–34.0)
MCHC: 29.8 g/dL — ABNORMAL LOW (ref 30.0–36.0)
MCV: 88.2 fL (ref 80.0–100.0)
Monocytes Absolute: 0.5 10*3/uL (ref 0.1–1.0)
Monocytes Relative: 6 %
Neutro Abs: 4.8 10*3/uL (ref 1.7–7.7)
Neutrophils Relative %: 60 %
Platelet Count: 339 10*3/uL (ref 150–400)
RBC: 3.73 MIL/uL — ABNORMAL LOW (ref 4.22–5.81)
RDW: 16.5 % — ABNORMAL HIGH (ref 11.5–15.5)
WBC Count: 8 10*3/uL (ref 4.0–10.5)
nRBC: 0 % (ref 0.0–0.2)

## 2020-05-27 LAB — CMP (CANCER CENTER ONLY)
ALT: 18 U/L (ref 0–44)
AST: 15 U/L (ref 15–41)
Albumin: 4.1 g/dL (ref 3.5–5.0)
Alkaline Phosphatase: 90 U/L (ref 38–126)
Anion gap: 6 (ref 5–15)
BUN: 19 mg/dL (ref 8–23)
CO2: 28 mmol/L (ref 22–32)
Calcium: 10.5 mg/dL — ABNORMAL HIGH (ref 8.9–10.3)
Chloride: 105 mmol/L (ref 98–111)
Creatinine: 1.51 mg/dL — ABNORMAL HIGH (ref 0.61–1.24)
GFR, Estimated: 51 mL/min — ABNORMAL LOW (ref >60)
Glucose, Bld: 87 mg/dL (ref 70–99)
Potassium: 4.5 mmol/L (ref 3.5–5.1)
Sodium: 139 mmol/L (ref 135–145)
Total Bilirubin: 0.2 mg/dL — ABNORMAL LOW (ref 0.3–1.2)
Total Protein: 7.3 g/dL (ref 6.5–8.1)

## 2020-05-27 LAB — RETICULOCYTES
Immature Retic Fract: 23.3 % — ABNORMAL HIGH (ref 2.3–15.9)
RBC.: 3.74 MIL/uL — ABNORMAL LOW (ref 4.22–5.81)
Retic Count, Absolute: 93.5 10*3/uL (ref 19.0–186.0)
Retic Ct Pct: 2.5 % (ref 0.4–3.1)

## 2020-05-27 LAB — VITAMIN B12: Vitamin B-12: 814 pg/mL (ref 180–914)

## 2020-05-27 MED ORDER — SODIUM CHLORIDE 0.9 % IV SOLN
200.0000 mg | Freq: Once | INTRAVENOUS | Status: AC
  Administered 2020-05-27: 200 mg via INTRAVENOUS
  Filled 2020-05-27: qty 200

## 2020-05-27 MED ORDER — CYANOCOBALAMIN 1000 MCG/ML IJ SOLN
1000.0000 ug | Freq: Once | INTRAMUSCULAR | Status: AC
  Administered 2020-05-27: 1000 ug via INTRAMUSCULAR

## 2020-05-27 MED ORDER — SODIUM CHLORIDE 0.9 % IV SOLN
INTRAVENOUS | Status: DC
  Filled 2020-05-27: qty 250

## 2020-05-27 MED ORDER — CYANOCOBALAMIN 1000 MCG/ML IJ SOLN
INTRAMUSCULAR | Status: AC
  Filled 2020-05-27: qty 1

## 2020-05-27 NOTE — Telephone Encounter (Signed)
Called pt and wife with 05/27/20 LOS appts for 12/13 and 1/10   AOM

## 2020-05-27 NOTE — Patient Instructions (Signed)

## 2020-05-27 NOTE — Progress Notes (Signed)
Hematology and Oncology Follow Up Visit  James Zuniga [id number] [birth year] 64 y.o. 05/27/2020   Principle Diagnosis:  Iron deficiency anemia Hypotestosteronemia Chronic liver lesions Pernicious anemia  Of Note: History of stroke in June 2018, no more Testosterone or ESA's  Current Therapy: IV iron as indicated  Vit B12 1 mg IM q month   Interim History:  James Zuniga is here today for follow-up and B 12. He is feeling fatigue and notes occasional angina. He states that he recently saw his cardiologist and will be having an ECHO for further work up. No chest pain at this time.    Occasional SOB with over exertion. He will take a break and rest as needed.  No fever, chills, n/v, cough, rash, dizziness, palpitations, abdominal pain or changes in bowel or bladder habits.  No blood loss noted. No bruising or petechiae.  B 12 level in September was 843.  No swelling or tenderness in his extremities.  He has intermittent numbness and tingling in his hands, legs ad feet.  He denies falls or syncope.  He has maintained a good appetite and is staying well hydrated. His weight is stable at 240 lbs.   ECOG Performance Status: 1 - Symptomatic but completely ambulatory  Medications:  Allergies as of 05/27/2020   No Known Allergies     Medication List       Accurate as of May 27, 2020 12:27 PM. If you have any questions, ask your nurse or doctor.        STOP taking these medications   zolpidem 10 MG tablet Commonly known as: AMBIEN Stopped by: Laverna Peace, NP     TAKE these medications   albuterol 108 (90 Base) MCG/ACT inhaler Commonly known as: VENTOLIN HFA Inhale 2 puffs into the lungs every 6 (six) hours as needed for wheezing or shortness of breath.   allopurinol 300 MG tablet Commonly known as: ZYLOPRIM Take 300 mg by mouth daily.   amLODipine 5 MG tablet Commonly known as: NORVASC Take 5 mg by mouth daily.   Anoro Ellipta 62.5-25 MCG/INH  Aepb Generic drug: umeclidinium-vilanterol Inhale 1 puff into the lungs daily.   aspirin 81 MG EC tablet Take 81 mg by mouth daily. Swallow whole.   atorvastatin 80 MG tablet Commonly known as: LIPITOR Take 1 tablet (80 mg total) by mouth every morning.   clopidogrel 75 MG tablet Commonly known as: PLAVIX Take 1 tablet (75 mg total) by mouth daily.   diclofenac sodium 1 % Gel Commonly known as: VOLTAREN Use 4 grams to knees tid prn   DULoxetine 60 MG capsule Commonly known as: CYMBALTA Take 60 mg by mouth 2 (two) times daily.   Fifty50 Glucose Meter 2.0 w/Device Kit To check blood sugars TID or Use as instructed Include strips. Lancets, lancet device, control solution. batteries   Fifty50 Pen Needles 32G X 4 MM Misc Generic drug: Insulin Pen Needle 1 each by Misc.(Non-Drug; Combo Route) route daily.   furosemide 20 MG tablet Commonly known as: LASIX Take 20 mg by mouth every Tuesday, Thursday, and Saturday at 6 PM.   gabapentin 300 MG capsule Commonly known as: NEURONTIN Take 300 mg by mouth 4 (four) times daily.   insulin NPH-regular Human (70-30) 100 UNIT/ML injection Commonly known as: NovoLIN 70/30 Please inject 25 units twice daily before breakfast and dinner. What changed:   how much to take  how to take this  when to take this  additional instructions   insulin regular  100 units/mL injection Commonly known as: NOVOLIN R Use as sliding scale up to 10 units tid with meals   ipratropium-albuterol 0.5-2.5 (3) MG/3ML Soln Commonly known as: DUONEB USE 1 VIAL IN NEBULIZER 4 TIMES DAILY   losartan 100 MG tablet Commonly known as: COZAAR Take 100 mg by mouth daily.   metoprolol 200 MG 24 hr tablet Commonly known as: TOPROL-XL Take 200 mg by mouth daily. What changed: Another medication with the same name was removed. Continue taking this medication, and follow the directions you see here. Changed by: Laverna Peace, NP   onetouch ultrasoft  lancets USE 1 LANCET TO CHECK GLUCOSE IN THE MORNING BEFORE BREAKFAST   pantoprazole 20 MG tablet Commonly known as: PROTONIX Take 20 mg by mouth daily.   potassium chloride SA 20 MEQ tablet Commonly known as: KLOR-CON Take 1 tablet (20 mEq total) by mouth 2 (two) times daily. What changed:   when to take this  additional instructions   Precision QID Test test strip Generic drug: glucose blood 1 each by Misc.(Non-Drug; Combo Route) route daily.   primidone 50 MG tablet Commonly known as: MYSOLINE Take 100 mg by mouth at bedtime.   sildenafil 100 MG tablet Commonly known as: VIAGRA Take by mouth.   spironolactone 25 MG tablet Commonly known as: ALDACTONE Take 25 mg by mouth daily.   tamsulosin 0.4 MG Caps capsule Commonly known as: FLOMAX Take 1 capsule (0.4 mg total) by mouth daily after breakfast.   tiZANidine 4 MG tablet Commonly known as: ZANAFLEX Take 4 mg by mouth 3 (three) times daily as needed for muscle spasms.   tiZANidine 4 MG capsule Commonly known as: ZANAFLEX Take 4 mg by mouth 3 (three) times daily.   traMADol 50 MG tablet Commonly known as: ULTRAM Take 50 mg by mouth daily as needed for moderate pain.   Tyler Aas FlexTouch 200 UNIT/ML FlexTouch Pen Generic drug: insulin degludec 60 units once daily per patient.   trimethoprim-polymyxin b ophthalmic solution Commonly known as: POLYTRIM INSTILL 1 DROP INTO LEFT EYE 4 TIMES DAILY FOR 5 DAYS   VITAMIN B-12 IJ Inject 1,000 mg as directed every 30 (thirty) days. X 4       Allergies: No Known Allergies  Past Medical History, Surgical history, Social history, and Family History were reviewed and updated.  Review of Systems: All other 10 point review of systems is negative.   Physical Exam:  weight is 240 lb 12.8 oz (109.2 kg). His oral temperature is 98.3 F (36.8 C). His blood pressure is 106/60 and his pulse is 77. His respiration is 18 and oxygen saturation is 95%.   Wt Readings from  Last 3 Encounters:  05/27/20 240 lb 12.8 oz (109.2 kg)  03/25/20 241 lb 12.8 oz (109.7 kg)  01/22/20 245 lb 1.9 oz (111.2 kg)    Ocular: Sclerae unicteric, pupils equal, round and reactive to light Ear-nose-throat: Oropharynx clear, dentition fair Lymphatic: No cervical or supraclavicular adenopathy Lungs no rales or rhonchi, good excursion bilaterally Heart regular rate and rhythm, no murmur appreciated Abd soft, nontender, positive bowel sounds MSK no focal spinal tenderness, no joint edema Neuro: non-focal, well-oriented, appropriate affect Breasts: Deferred   Lab Results  Component Value Date   WBC 8.0 05/27/2020   HGB 9.8 (L) 05/27/2020   HCT 32.9 (L) 05/27/2020   MCV 88.2 05/27/2020   PLT 339 05/27/2020   Lab Results  Component Value Date   FERRITIN 164 03/25/2020   IRON 35 (L) 03/25/2020  TIBC 306 03/25/2020   UIBC 271 03/25/2020   IRONPCTSAT 11 (L) 03/25/2020   Lab Results  Component Value Date   RETICCTPCT 2.5 05/27/2020   RBC 3.74 (L) 05/27/2020   RETICCTABS 63.0 06/20/2015   No results found for: Nils Pyle [facility] Lab Results  Component Value Date   IGA 139 08/26/2017   No results found for: Odetta Pink, SPEI   Chemistry      Component Value Date/Time   NA 139 05/27/2020 1138   NA 141 09/25/2016 0809   K 4.5 05/27/2020 1138   K 2.8 (LL) 09/25/2016 0809   CL 105 05/27/2020 1138   CL 102 07/31/2016 0815   CO2 28 05/27/2020 1138   CO2 28 09/25/2016 0809   BUN 19 05/27/2020 1138   BUN 13.1 09/25/2016 0809   CREATININE 1.51 (H) 05/27/2020 1138   CREATININE 1.2 09/25/2016 0809      Component Value Date/Time   CALCIUM 10.5 (H) 05/27/2020 1138   CALCIUM 9.5 09/25/2016 0809   ALKPHOS 90 05/27/2020 1138   ALKPHOS 126 09/25/2016 0809   AST 15 05/27/2020 1138   AST 19 09/25/2016 0809   ALT 18 05/27/2020 1138   ALT 23 09/25/2016 0809   BILITOT 0.2 (L) 05/27/2020 1138    BILITOT 0.45 09/25/2016 0809       Impression and Plan: Mr. Naas is a very pleasant 64yo African American gentleman withmultifactorial anemia including chronic iron deficiency. He received B 12 today.  We will also give him IV iron today for drop in Hgb. Ok to give per [place].  Follow-up in 2 months with B 12 injection monthly.  He can contact our office with any questions or concerns.   Laverna Peace, NP 11/15/202112:27 PM

## 2020-05-28 ENCOUNTER — Telehealth: Payer: Self-pay

## 2020-05-28 LAB — IRON AND TIBC
Iron: 41 ug/dL — ABNORMAL LOW (ref 42–163)
Saturation Ratios: 14 % — ABNORMAL LOW (ref 20–55)
TIBC: 296 ug/dL (ref 202–409)
UIBC: 256 ug/dL (ref 117–376)

## 2020-05-28 LAB — FERRITIN: Ferritin: 210 ng/mL (ref 24–336)

## 2020-05-28 NOTE — Telephone Encounter (Signed)
Spoke with pt per 05/28/20 inbasket message for [place]...   AOM

## 2020-05-30 ENCOUNTER — Other Ambulatory Visit: Payer: Self-pay

## 2020-05-30 ENCOUNTER — Inpatient Hospital Stay: Payer: Medicare Other

## 2020-05-30 VITALS — BP 101/57 | HR 80 | Temp 98.4°F | Resp 18

## 2020-05-30 DIAGNOSIS — D509 Iron deficiency anemia, unspecified: Secondary | ICD-10-CM | POA: Diagnosis not present

## 2020-05-30 MED ORDER — SODIUM CHLORIDE 0.9 % IV SOLN
200.0000 mg | Freq: Once | INTRAVENOUS | Status: AC
  Administered 2020-05-30: 200 mg via INTRAVENOUS
  Filled 2020-05-30: qty 200

## 2020-05-30 MED ORDER — SODIUM CHLORIDE 0.9 % IV SOLN
Freq: Once | INTRAVENOUS | Status: AC
  Filled 2020-05-30: qty 250

## 2020-05-30 NOTE — Patient Instructions (Signed)

## 2020-05-30 NOTE — Progress Notes (Signed)
Patient declined to stay for the post infusion observation period. Patient denies any difficulty with this infusion in the past and is aware to call with any questions or concerns.   Pt verbalized understanding and had no further questions today and dishcarged ambulatory and stable.

## 2020-06-03 ENCOUNTER — Telehealth: Payer: Self-pay

## 2020-06-03 ENCOUNTER — Inpatient Hospital Stay: Payer: Medicare Other

## 2020-06-03 NOTE — Telephone Encounter (Signed)
Patient wife called and left message asking when next iron infusion is scheduled.  Called patient back informed him one was scheduled for this morning at 10am. He is unable to reschedule for today. Informed of next appt on 10/24 and will need to reschedule the missed one from today while he is here on 10/24. Patient verbalized understanding and denies any other questions at this time.

## 2020-06-05 ENCOUNTER — Other Ambulatory Visit: Payer: Self-pay

## 2020-06-05 ENCOUNTER — Inpatient Hospital Stay: Payer: Medicare Other

## 2020-06-05 VITALS — BP 112/63 | HR 65 | Temp 98.9°F | Resp 18

## 2020-06-05 DIAGNOSIS — D509 Iron deficiency anemia, unspecified: Secondary | ICD-10-CM

## 2020-06-05 MED ORDER — SODIUM CHLORIDE 0.9 % IV SOLN
INTRAVENOUS | Status: DC
  Filled 2020-06-05: qty 250

## 2020-06-05 MED ORDER — SODIUM CHLORIDE 0.9 % IV SOLN
200.0000 mg | Freq: Once | INTRAVENOUS | Status: AC
  Administered 2020-06-05: 200 mg via INTRAVENOUS
  Filled 2020-06-05: qty 200

## 2020-06-05 NOTE — Patient Instructions (Signed)

## 2020-06-05 NOTE — Progress Notes (Signed)
Patient declined to stay for the post infusion observation period. Patient denies any difficulty with this infusion in the past and is aware to call with any questions or concerns.   Pt verbalized understanding and had no further questions today and discharged ambulatory and stable.

## 2020-06-14 ENCOUNTER — Other Ambulatory Visit: Payer: Self-pay

## 2020-06-14 ENCOUNTER — Inpatient Hospital Stay: Payer: Medicare Other | Attending: Family

## 2020-06-14 VITALS — BP 101/67 | HR 71 | Temp 97.8°F | Resp 20

## 2020-06-14 DIAGNOSIS — D510 Vitamin B12 deficiency anemia due to intrinsic factor deficiency: Secondary | ICD-10-CM | POA: Insufficient documentation

## 2020-06-14 DIAGNOSIS — D509 Iron deficiency anemia, unspecified: Secondary | ICD-10-CM | POA: Insufficient documentation

## 2020-06-14 MED ORDER — CYANOCOBALAMIN 1000 MCG/ML IJ SOLN
1000.0000 ug | Freq: Once | INTRAMUSCULAR | Status: AC
  Administered 2020-06-14: 1000 ug via INTRAMUSCULAR

## 2020-06-14 MED ORDER — SODIUM CHLORIDE 0.9 % IV SOLN
Freq: Once | INTRAVENOUS | Status: AC
  Filled 2020-06-14: qty 250

## 2020-06-14 MED ORDER — CYANOCOBALAMIN 1000 MCG/ML IJ SOLN
INTRAMUSCULAR | Status: AC
  Filled 2020-06-14: qty 1

## 2020-06-14 MED ORDER — SODIUM CHLORIDE 0.9 % IV SOLN
200.0000 mg | Freq: Once | INTRAVENOUS | Status: AC
  Administered 2020-06-14: 200 mg via INTRAVENOUS
  Filled 2020-06-14: qty 200

## 2020-06-14 NOTE — Progress Notes (Signed)
Pt discharged in no apparent distress. Pt left ambulatory without assistance. Pt aware of discharge instructions and verbalized understanding and had no further questions.  

## 2020-06-14 NOTE — Patient Instructions (Signed)

## 2020-06-24 ENCOUNTER — Inpatient Hospital Stay: Payer: Medicare Other

## 2020-07-04 ENCOUNTER — Ambulatory Visit: Payer: Medicare Other | Admitting: Hematology & Oncology

## 2020-07-04 ENCOUNTER — Other Ambulatory Visit: Payer: Medicare Other

## 2020-07-22 ENCOUNTER — Other Ambulatory Visit: Payer: Self-pay

## 2020-07-22 ENCOUNTER — Inpatient Hospital Stay: Payer: Medicare Other | Attending: Family

## 2020-07-22 ENCOUNTER — Inpatient Hospital Stay: Payer: Medicare Other

## 2020-07-22 ENCOUNTER — Inpatient Hospital Stay (HOSPITAL_BASED_OUTPATIENT_CLINIC_OR_DEPARTMENT_OTHER): Payer: Medicare Other | Admitting: Family

## 2020-07-22 ENCOUNTER — Encounter: Payer: Self-pay | Admitting: Family

## 2020-07-22 VITALS — BP 101/62 | HR 93 | Temp 98.5°F | Resp 18 | Ht 66.0 in | Wt 239.1 lb

## 2020-07-22 DIAGNOSIS — Z79899 Other long term (current) drug therapy: Secondary | ICD-10-CM | POA: Insufficient documentation

## 2020-07-22 DIAGNOSIS — D500 Iron deficiency anemia secondary to blood loss (chronic): Secondary | ICD-10-CM | POA: Diagnosis not present

## 2020-07-22 DIAGNOSIS — D509 Iron deficiency anemia, unspecified: Secondary | ICD-10-CM | POA: Diagnosis present

## 2020-07-22 DIAGNOSIS — D510 Vitamin B12 deficiency anemia due to intrinsic factor deficiency: Secondary | ICD-10-CM

## 2020-07-22 DIAGNOSIS — D631 Anemia in chronic kidney disease: Secondary | ICD-10-CM

## 2020-07-22 LAB — CBC WITH DIFFERENTIAL (CANCER CENTER ONLY)
Abs Immature Granulocytes: 0.09 10*3/uL — ABNORMAL HIGH (ref 0.00–0.07)
Basophils Absolute: 0 10*3/uL (ref 0.0–0.1)
Basophils Relative: 1 %
Eosinophils Absolute: 0.2 10*3/uL (ref 0.0–0.5)
Eosinophils Relative: 2 %
HCT: 33 % — ABNORMAL LOW (ref 39.0–52.0)
Hemoglobin: 10.2 g/dL — ABNORMAL LOW (ref 13.0–17.0)
Immature Granulocytes: 1 %
Lymphocytes Relative: 25 %
Lymphs Abs: 1.9 10*3/uL (ref 0.7–4.0)
MCH: 27.3 pg (ref 26.0–34.0)
MCHC: 30.9 g/dL (ref 30.0–36.0)
MCV: 88.2 fL (ref 80.0–100.0)
Monocytes Absolute: 0.4 10*3/uL (ref 0.1–1.0)
Monocytes Relative: 5 %
Neutro Abs: 4.9 10*3/uL (ref 1.7–7.7)
Neutrophils Relative %: 66 %
Platelet Count: 358 10*3/uL (ref 150–400)
RBC: 3.74 MIL/uL — ABNORMAL LOW (ref 4.22–5.81)
RDW: 16.3 % — ABNORMAL HIGH (ref 11.5–15.5)
WBC Count: 7.5 10*3/uL (ref 4.0–10.5)
nRBC: 0 % (ref 0.0–0.2)

## 2020-07-22 LAB — VITAMIN B12: Vitamin B-12: 747 pg/mL (ref 180–914)

## 2020-07-22 LAB — CMP (CANCER CENTER ONLY)
ALT: 20 U/L (ref 0–44)
AST: 17 U/L (ref 15–41)
Albumin: 4.4 g/dL (ref 3.5–5.0)
Alkaline Phosphatase: 94 U/L (ref 38–126)
Anion gap: 6 (ref 5–15)
BUN: 13 mg/dL (ref 8–23)
CO2: 28 mmol/L (ref 22–32)
Calcium: 11.1 mg/dL — ABNORMAL HIGH (ref 8.9–10.3)
Chloride: 101 mmol/L (ref 98–111)
Creatinine: 1.33 mg/dL — ABNORMAL HIGH (ref 0.61–1.24)
GFR, Estimated: 59 mL/min — ABNORMAL LOW (ref >60)
Glucose, Bld: 105 mg/dL — ABNORMAL HIGH (ref 70–99)
Potassium: 4.7 mmol/L (ref 3.5–5.1)
Sodium: 135 mmol/L (ref 135–145)
Total Bilirubin: 0.2 mg/dL — ABNORMAL LOW (ref 0.3–1.2)
Total Protein: 7.6 g/dL (ref 6.5–8.1)

## 2020-07-22 LAB — RETICULOCYTES
Immature Retic Fract: 26.4 % — ABNORMAL HIGH (ref 2.3–15.9)
RBC.: 3.77 MIL/uL — ABNORMAL LOW (ref 4.22–5.81)
Retic Count, Absolute: 117.2 10*3/uL (ref 19.0–186.0)
Retic Ct Pct: 3.1 % (ref 0.4–3.1)

## 2020-07-22 MED ORDER — CYANOCOBALAMIN 1000 MCG/ML IJ SOLN
1000.0000 ug | Freq: Once | INTRAMUSCULAR | Status: AC
  Administered 2020-07-22: 1000 ug via INTRAMUSCULAR

## 2020-07-22 NOTE — Progress Notes (Signed)
Hematology and Oncology Follow Up Visit  James Zuniga [id number] 06-22-1956 65 y.o. 07/22/2020   Principle Diagnosis:  Iron deficiency anemia Hypotestosteronemia Chronic liver lesions Pernicious anemia  Of Note: History of stroke in June 2018, no more Testosterone or ESA's  Current Therapy: IV iron as indicated  Vit B12 1 mg IM q month   Interim History:  James Zuniga is here today for follow-up. He recently had another stroke and was hospitalized in [facility] in [place], [place]. He states that they are unsure as to why he had the stroke but he has a follow-up with a neurosurgeon around 07/30/2020.  No records available in care everywhere at this time.  He states that he is still a little weak on the left side and will veer to the left some when walking. He is ambulating with a can for added support. No falls or syncopal episodes to report.  He also notes that he a little more forgetful.  His speech is clear today and he is alert and oriented. He states that he is independent with ADL's and drove himself here today as his wife worked last night and was sleeping.  PCP has put in a PT consult as he did not want to go to a SNF.  He also needs to have an ECHO and states that he will call and schedule this.  He has SOB with exertion at baseline and states that this is unchanged.  No fever, chills, n/v, cough, rash, dizziness, SOB, chest pain, palpitations, abdominal pain or changes in bowel or bladder habits.  No swelling, tenderness, numbness or tingling in his extremities at this time.  No blood loss noted. No abnormal bruising, no petechiae.  He states that he has a good appetite and is staying well hydrated. His weight is stable at 239 lbs.   ECOG Performance Status: 1 - Symptomatic but completely ambulatory  Medications:  Allergies as of 07/22/2020   No Known Allergies     Medication List       Accurate as of July 22, 2020  1:53 PM. If you have any  questions, ask your nurse or doctor.        albuterol 108 (90 Base) MCG/ACT inhaler Commonly known as: VENTOLIN HFA Inhale 2 puffs into the lungs every 6 (six) hours as needed for wheezing or shortness of breath.   allopurinol 300 MG tablet Commonly known as: ZYLOPRIM Take 300 mg by mouth daily.   amLODipine 5 MG tablet Commonly known as: NORVASC Take 5 mg by mouth daily.   Anoro Ellipta 62.5-25 MCG/INH Aepb Generic drug: umeclidinium-vilanterol Inhale 1 puff into the lungs daily.   aspirin 81 MG EC tablet Take 81 mg by mouth daily. Swallow whole.   atorvastatin 80 MG tablet Commonly known as: LIPITOR Take 1 tablet (80 mg total) by mouth every morning.   clopidogrel 75 MG tablet Commonly known as: PLAVIX Take 1 tablet (75 mg total) by mouth daily.   diclofenac sodium 1 % Gel Commonly known as: VOLTAREN Use 4 grams to knees tid prn   DULoxetine 60 MG capsule Commonly known as: CYMBALTA Take 60 mg by mouth 2 (two) times daily.   Fifty50 Glucose Meter 2.0 w/Device Kit To check blood sugars TID or Use as instructed Include strips. Lancets, lancet device, control solution. batteries   Fifty50 Pen Needles 32G X 4 MM Misc Generic drug: Insulin Pen Needle 1 each by Misc.(Non-Drug; Combo Route) route daily.   furosemide 20 MG tablet  Commonly known as: LASIX Take 20 mg by mouth every Tuesday, Thursday, and Saturday at 6 PM.   gabapentin 300 MG capsule Commonly known as: NEURONTIN Take 300 mg by mouth 4 (four) times daily.   insulin NPH-regular Human (70-30) 100 UNIT/ML injection Commonly known as: NovoLIN 70/30 Please inject 25 units twice daily before breakfast and dinner. What changed:   how much to take  how to take this  when to take this  additional instructions   insulin regular 100 units/mL injection Commonly known as: NOVOLIN R Use as sliding scale up to 10 units tid with meals   ipratropium-albuterol 0.5-2.5 (3) MG/3ML Soln Commonly known as:  DUONEB USE 1 VIAL IN NEBULIZER 4 TIMES DAILY   losartan 100 MG tablet Commonly known as: COZAAR Take 100 mg by mouth daily.   metoprolol 200 MG 24 hr tablet Commonly known as: TOPROL-XL Take 200 mg by mouth daily.   onetouch ultrasoft lancets USE 1 LANCET TO CHECK GLUCOSE IN THE MORNING BEFORE BREAKFAST   pantoprazole 20 MG tablet Commonly known as: PROTONIX Take 20 mg by mouth daily.   potassium chloride SA 20 MEQ tablet Commonly known as: KLOR-CON Take 1 tablet (20 mEq total) by mouth 2 (two) times daily. What changed:   when to take this  additional instructions   Precision QID Test test strip Generic drug: glucose blood 1 each by Misc.(Non-Drug; Combo Route) route daily.   primidone 50 MG tablet Commonly known as: MYSOLINE Take 100 mg by mouth at bedtime.   sildenafil 100 MG tablet Commonly known as: VIAGRA Take by mouth.   spironolactone 25 MG tablet Commonly known as: ALDACTONE Take 25 mg by mouth daily.   tamsulosin 0.4 MG Caps capsule Commonly known as: FLOMAX Take 1 capsule (0.4 mg total) by mouth daily after breakfast.   tiZANidine 4 MG tablet Commonly known as: ZANAFLEX Take 4 mg by mouth 3 (three) times daily as needed for muscle spasms.   tiZANidine 4 MG capsule Commonly known as: ZANAFLEX Take 4 mg by mouth 3 (three) times daily.   traMADol 50 MG tablet Commonly known as: ULTRAM Take 50 mg by mouth daily as needed for moderate pain.   Tyler Aas FlexTouch 200 UNIT/ML FlexTouch Pen Generic drug: insulin degludec 60 units once daily per patient.   trimethoprim-polymyxin b ophthalmic solution Commonly known as: POLYTRIM INSTILL 1 DROP INTO LEFT EYE 4 TIMES DAILY FOR 5 DAYS   VITAMIN B-12 IJ Inject 1,000 mg as directed every 30 (thirty) days. X 4       Allergies: No Known Allergies  Past Medical History, Surgical history, Social history, and Family History were reviewed and updated.  Review of Systems: All other 10 point review of  systems is negative.   Physical Exam:  vitals were not taken for this visit.   Wt Readings from Last 3 Encounters:  05/27/20 240 lb 12.8 oz (109.2 kg)  03/25/20 241 lb 12.8 oz (109.7 kg)  01/22/20 245 lb 1.9 oz (111.2 kg)    Ocular: Sclerae unicteric, pupils equal, round and reactive to light Ear-nose-throat: Oropharynx clear, dentition fair Lymphatic: No cervical or supraclavicular adenopathy Lungs no rales or rhonchi, good excursion bilaterally Heart regular rate and rhythm, no murmur appreciated Abd soft, nontender, positive bowel sounds MSK no focal spinal tenderness, no joint edema Neuro: non-focal, well-oriented, appropriate affect Breasts: Deferred   Lab Results  Component Value Date   WBC 8.0 05/27/2020   HGB 9.8 (L) 05/27/2020   HCT 32.9 (L)  05/27/2020   MCV 88.2 05/27/2020   PLT 339 05/27/2020   Lab Results  Component Value Date   FERRITIN 210 05/27/2020   IRON 41 (L) 05/27/2020   TIBC 296 05/27/2020   UIBC 256 05/27/2020   IRONPCTSAT 14 (L) 05/27/2020   Lab Results  Component Value Date   RETICCTPCT 2.5 05/27/2020   RBC 3.74 (L) 05/27/2020   RETICCTABS 63.0 06/20/2015   No results found for: Nils Pyle [facility] Lab Results  Component Value Date   IGA 139 08/26/2017   No results found for: Odetta Pink, SPEI   Chemistry      Component Value Date/Time   NA 139 05/27/2020 1138   NA 141 09/25/2016 0809   K 4.5 05/27/2020 1138   K 2.8 (LL) 09/25/2016 0809   CL 105 05/27/2020 1138   CL 102 07/31/2016 0815   CO2 28 05/27/2020 1138   CO2 28 09/25/2016 0809   BUN 19 05/27/2020 1138   BUN 13.1 09/25/2016 0809   CREATININE 1.51 (H) 05/27/2020 1138   CREATININE 1.2 09/25/2016 0809      Component Value Date/Time   CALCIUM 10.5 (H) 05/27/2020 1138   CALCIUM 9.5 09/25/2016 0809   ALKPHOS 90 05/27/2020 1138   ALKPHOS 126 09/25/2016 0809   AST 15 05/27/2020 1138   AST 19 09/25/2016  0809   ALT 18 05/27/2020 1138   ALT 23 09/25/2016 0809   BILITOT 0.2 (L) 05/27/2020 1138   BILITOT 0.45 09/25/2016 0809       Impression and Plan: James Zuniga is a very pleasant 65yo African American gentleman withmultifactorial anemia including chronic iron deficiency. B 12 given today.  Iron studies are pending. We will replace if needed.  Follow-up in 2 month. B12 injection monthly.  He can contact our office with any questions or concerns.   Laverna Peace, NP 1/10/20221:53 PM

## 2020-07-22 NOTE — Patient Instructions (Signed)

## 2020-07-23 LAB — IRON AND TIBC
Iron: 45 ug/dL (ref 42–163)
Saturation Ratios: 15 % — ABNORMAL LOW (ref 20–55)
TIBC: 303 ug/dL (ref 202–409)
UIBC: 257 ug/dL (ref 117–376)

## 2020-07-23 LAB — FERRITIN: Ferritin: 259 ng/mL (ref 24–336)

## 2020-07-30 ENCOUNTER — Other Ambulatory Visit: Payer: Self-pay

## 2020-07-30 ENCOUNTER — Inpatient Hospital Stay: Payer: Medicare Other

## 2020-07-30 VITALS — BP 104/71 | HR 74 | Temp 99.4°F | Resp 17

## 2020-07-30 DIAGNOSIS — D509 Iron deficiency anemia, unspecified: Secondary | ICD-10-CM | POA: Diagnosis not present

## 2020-07-30 MED ORDER — SODIUM CHLORIDE 0.9 % IV SOLN
Freq: Once | INTRAVENOUS | Status: AC
  Filled 2020-07-30: qty 250

## 2020-07-30 MED ORDER — SODIUM CHLORIDE 0.9 % IV SOLN
200.0000 mg | Freq: Once | INTRAVENOUS | Status: AC
  Administered 2020-07-30: 200 mg via INTRAVENOUS
  Filled 2020-07-30: qty 200

## 2020-07-30 NOTE — Patient Instructions (Signed)

## 2020-08-05 ENCOUNTER — Other Ambulatory Visit: Payer: Self-pay

## 2020-08-05 ENCOUNTER — Inpatient Hospital Stay: Payer: Medicare Other

## 2020-08-05 VITALS — BP 107/78 | HR 90 | Temp 97.9°F | Resp 17

## 2020-08-05 DIAGNOSIS — D509 Iron deficiency anemia, unspecified: Secondary | ICD-10-CM

## 2020-08-05 MED ORDER — SODIUM CHLORIDE 0.9 % IV SOLN
200.0000 mg | Freq: Once | INTRAVENOUS | Status: AC
  Administered 2020-08-05: 200 mg via INTRAVENOUS
  Filled 2020-08-05: qty 200

## 2020-08-05 MED ORDER — SODIUM CHLORIDE 0.9 % IV SOLN
Freq: Once | INTRAVENOUS | Status: AC
  Filled 2020-08-05: qty 250

## 2020-08-05 NOTE — Progress Notes (Signed)
Pt declined to stay for post infusion observation period. Pt stated he has tolerated medication multiple times prior without difficulty. Pt aware to call clinic with any questions or concerns. Pt verbalized understanding and had no further questions.   

## 2020-08-05 NOTE — Patient Instructions (Signed)

## 2020-08-07 ENCOUNTER — Inpatient Hospital Stay: Payer: Medicare Other

## 2020-08-07 ENCOUNTER — Other Ambulatory Visit: Payer: Self-pay

## 2020-08-07 VITALS — BP 90/63 | HR 63 | Temp 97.8°F | Resp 17

## 2020-08-07 DIAGNOSIS — D509 Iron deficiency anemia, unspecified: Secondary | ICD-10-CM

## 2020-08-07 MED ORDER — SODIUM CHLORIDE 0.9 % IV SOLN
Freq: Once | INTRAVENOUS | Status: AC
  Filled 2020-08-07: qty 250

## 2020-08-07 MED ORDER — SODIUM CHLORIDE 0.9 % IV SOLN
200.0000 mg | Freq: Once | INTRAVENOUS | Status: AC
  Administered 2020-08-07: 200 mg via INTRAVENOUS
  Filled 2020-08-07: qty 200

## 2020-08-07 NOTE — Progress Notes (Signed)
Pt declined to stay for post infusion observation period. Pt stated he has tolerated medication multiple times prior without difficulty. Pt aware to call clinic with any questions or concerns. Pt verbalized understanding and had no further questions.   

## 2020-08-22 ENCOUNTER — Ambulatory Visit: Payer: Medicare Other

## 2020-09-18 ENCOUNTER — Telehealth: Payer: Self-pay

## 2020-09-18 NOTE — Telephone Encounter (Signed)
Pt called in to r/s his appt to 3/15 as she will be in town for another appt   annr

## 2020-09-19 ENCOUNTER — Inpatient Hospital Stay: Payer: Medicare Other

## 2020-09-19 ENCOUNTER — Inpatient Hospital Stay: Payer: Medicare Other | Admitting: Family

## 2020-09-24 ENCOUNTER — Inpatient Hospital Stay (HOSPITAL_BASED_OUTPATIENT_CLINIC_OR_DEPARTMENT_OTHER): Payer: Medicare Other | Admitting: Family

## 2020-09-24 ENCOUNTER — Encounter: Payer: Self-pay | Admitting: Family

## 2020-09-24 ENCOUNTER — Inpatient Hospital Stay: Payer: Medicare Other | Attending: Family

## 2020-09-24 ENCOUNTER — Telehealth: Payer: Self-pay

## 2020-09-24 ENCOUNTER — Other Ambulatory Visit: Payer: Self-pay

## 2020-09-24 ENCOUNTER — Inpatient Hospital Stay: Payer: Medicare Other

## 2020-09-24 VITALS — BP 108/78 | HR 74 | Temp 98.4°F | Resp 18 | Wt 237.8 lb

## 2020-09-24 DIAGNOSIS — D510 Vitamin B12 deficiency anemia due to intrinsic factor deficiency: Secondary | ICD-10-CM

## 2020-09-24 DIAGNOSIS — D631 Anemia in chronic kidney disease: Secondary | ICD-10-CM | POA: Diagnosis not present

## 2020-09-24 DIAGNOSIS — Z79899 Other long term (current) drug therapy: Secondary | ICD-10-CM | POA: Diagnosis not present

## 2020-09-24 DIAGNOSIS — D509 Iron deficiency anemia, unspecified: Secondary | ICD-10-CM | POA: Diagnosis not present

## 2020-09-24 DIAGNOSIS — D500 Iron deficiency anemia secondary to blood loss (chronic): Secondary | ICD-10-CM

## 2020-09-24 LAB — CBC WITH DIFFERENTIAL (CANCER CENTER ONLY)
Abs Immature Granulocytes: 0.02 10*3/uL (ref 0.00–0.07)
Basophils Absolute: 0.1 10*3/uL (ref 0.0–0.1)
Basophils Relative: 1 %
Eosinophils Absolute: 0.2 10*3/uL (ref 0.0–0.5)
Eosinophils Relative: 3 %
HCT: 40.1 % (ref 39.0–52.0)
Hemoglobin: 12.4 g/dL — ABNORMAL LOW (ref 13.0–17.0)
Immature Granulocytes: 0 %
Lymphocytes Relative: 29 %
Lymphs Abs: 2.1 10*3/uL (ref 0.7–4.0)
MCH: 27.1 pg (ref 26.0–34.0)
MCHC: 30.9 g/dL (ref 30.0–36.0)
MCV: 87.7 fL (ref 80.0–100.0)
Monocytes Absolute: 0.4 10*3/uL (ref 0.1–1.0)
Monocytes Relative: 6 %
Neutro Abs: 4.6 10*3/uL (ref 1.7–7.7)
Neutrophils Relative %: 61 %
Platelet Count: 344 10*3/uL (ref 150–400)
RBC: 4.57 MIL/uL (ref 4.22–5.81)
RDW: 15.1 % (ref 11.5–15.5)
WBC Count: 7.4 10*3/uL (ref 4.0–10.5)
nRBC: 0 % (ref 0.0–0.2)

## 2020-09-24 LAB — CMP (CANCER CENTER ONLY)
ALT: 18 U/L (ref 0–44)
AST: 17 U/L (ref 15–41)
Albumin: 4.3 g/dL (ref 3.5–5.0)
Alkaline Phosphatase: 92 U/L (ref 38–126)
Anion gap: 4 — ABNORMAL LOW (ref 5–15)
BUN: 10 mg/dL (ref 8–23)
CO2: 32 mmol/L (ref 22–32)
Calcium: 10.8 mg/dL — ABNORMAL HIGH (ref 8.9–10.3)
Chloride: 102 mmol/L (ref 98–111)
Creatinine: 1.41 mg/dL — ABNORMAL HIGH (ref 0.61–1.24)
GFR, Estimated: 55 mL/min — ABNORMAL LOW (ref >60)
Glucose, Bld: 92 mg/dL (ref 70–99)
Potassium: 4.4 mmol/L (ref 3.5–5.1)
Sodium: 138 mmol/L (ref 135–145)
Total Bilirubin: 0.3 mg/dL (ref 0.3–1.2)
Total Protein: 7.4 g/dL (ref 6.5–8.1)

## 2020-09-24 LAB — RETICULOCYTES
Immature Retic Fract: 14.5 % (ref 2.3–15.9)
RBC.: 4.59 MIL/uL (ref 4.22–5.81)
Retic Count, Absolute: 74.8 10*3/uL (ref 19.0–186.0)
Retic Ct Pct: 1.6 % (ref 0.4–3.1)

## 2020-09-24 LAB — VITAMIN B12: Vitamin B-12: 606 pg/mL (ref 180–914)

## 2020-09-24 MED ORDER — CYANOCOBALAMIN 1000 MCG/ML IJ SOLN
INTRAMUSCULAR | Status: AC
  Filled 2020-09-24: qty 1

## 2020-09-24 MED ORDER — CYANOCOBALAMIN 1000 MCG/ML IJ SOLN
1000.0000 ug | Freq: Once | INTRAMUSCULAR | Status: AC
  Administered 2020-09-24: 1000 ug via INTRAMUSCULAR

## 2020-09-24 NOTE — Telephone Encounter (Signed)
appts made per 09/24/20 los and a calendar has been mailed to the pt   James Zuniga

## 2020-09-24 NOTE — Patient Instructions (Signed)

## 2020-09-24 NOTE — Progress Notes (Signed)
Hematology and Oncology Follow Up Visit  James Zuniga [id number] 20-Sep-1955 65 y.o. 09/24/2020   Principle Diagnosis:  Iron deficiency anemia Hypotestosteronemia Chronic liver lesions Pernicious anemia  Of Note: History of stroke in June 2018, no more Testosterone or ESA's  Current Therapy: IV iron as indicated  Vit B12 1 mg IM q month   Interim History:  James Zuniga is here today for follow-up and B 12 injection. He is doing fairly well but states that he has gout in the wrists, ankles and feet. He is currently on colchicine and allopurinol and states that he saw his PCP today and had lab work drawn today to re-evaluate.  He has mild SOB with exertion and takes a break to rest when needed.  He states that he recently joined a gym with his wife and they are working out regularly. He has not noted any blood loss. No bruising or petechiae.  No fever, chills, n/v, cough, rash, dizziness, chest pain, palpitations, adominal pain or changes in bowel or bladder habits.  He has some tingling in the wrist that he associated with arthritis. He also has arthritis in the left knee and plans to schedule a steroid injection for his next visit in town with his PCP.  No falls or syncope to report. He is ambulating with a cane for added support.  He states that he has a very good appetite and is staying well hydrated. His weight is stable at 237 lbs.   ECOG Performance Status: 1 - Symptomatic but completely ambulatory  Medications:  Allergies as of 09/24/2020   No Known Allergies     Medication List       Accurate as of September 24, 2020  2:00 PM. If you have any questions, ask your nurse or doctor.        albuterol 108 (90 Base) MCG/ACT inhaler Commonly known as: VENTOLIN HFA Inhale 2 puffs into the lungs every 6 (six) hours as needed for wheezing or shortness of breath.   allopurinol 300 MG tablet Commonly known as: ZYLOPRIM Take 300 mg by mouth daily.   amLODipine 5 MG  tablet Commonly known as: NORVASC Take 5 mg by mouth daily.   Anoro Ellipta 62.5-25 MCG/INH Aepb Generic drug: umeclidinium-vilanterol Inhale 1 puff into the lungs daily.   atorvastatin 80 MG tablet Commonly known as: LIPITOR Take 1 tablet (80 mg total) by mouth every morning.   clopidogrel 75 MG tablet Commonly known as: PLAVIX Take 1 tablet (75 mg total) by mouth daily.   diclofenac sodium 1 % Gel Commonly known as: VOLTAREN Use 4 grams to knees tid prn   DULoxetine 60 MG capsule Commonly known as: CYMBALTA Take 60 mg by mouth 2 (two) times daily.   EQ Aspirin Low Dose 81 MG chewable tablet Generic drug: aspirin Chew 81 mg by mouth every morning.   Fifty50 Glucose Meter 2.0 w/Device Kit To check blood sugars TID or Use as instructed Include strips. Lancets, lancet device, control solution. batteries   Fifty50 Pen Needles 32G X 4 MM Misc Generic drug: Insulin Pen Needle 1 each by Misc.(Non-Drug; Combo Route) route daily.   furosemide 20 MG tablet Commonly known as: LASIX Take 20 mg by mouth every Tuesday, Thursday, and Saturday at 6 PM.   gabapentin 300 MG capsule Commonly known as: NEURONTIN Take 300 mg by mouth 4 (four) times daily.   glucose blood test strip 1 each by Misc.(Non-Drug; Combo Route) route daily.   insulin NPH-regular Human (70-30)  100 UNIT/ML injection Commonly known as: NovoLIN 70/30 Please inject 25 units twice daily before breakfast and dinner. What changed:   how much to take  how to take this  when to take this  additional instructions   insulin regular 100 units/mL injection Commonly known as: NOVOLIN R Use as sliding scale up to 10 units tid with meals   ipratropium-albuterol 0.5-2.5 (3) MG/3ML Soln Commonly known as: DUONEB USE 1 VIAL IN NEBULIZER 4 TIMES DAILY   losartan 100 MG tablet Commonly known as: COZAAR Take 100 mg by mouth daily.   metoprolol 200 MG 24 hr tablet Commonly known as: TOPROL-XL Take 200 mg by  mouth daily.   onetouch ultrasoft lancets USE 1 LANCET TO CHECK GLUCOSE IN THE MORNING BEFORE BREAKFAST   pantoprazole 20 MG tablet Commonly known as: PROTONIX Take 20 mg by mouth daily.   potassium chloride SA 20 MEQ tablet Commonly known as: KLOR-CON Take 1 tablet (20 mEq total) by mouth 2 (two) times daily. What changed:   when to take this  additional instructions   primidone 50 MG tablet Commonly known as: MYSOLINE Take 100 mg by mouth at bedtime.   sildenafil 100 MG tablet Commonly known as: VIAGRA Take by mouth.   spironolactone 25 MG tablet Commonly known as: ALDACTONE Take 25 mg by mouth daily.   tamsulosin 0.4 MG Caps capsule Commonly known as: FLOMAX Take 1 capsule (0.4 mg total) by mouth daily after breakfast.   tiZANidine 4 MG tablet Commonly known as: ZANAFLEX Take 4 mg by mouth 3 (three) times daily as needed for muscle spasms.   tiZANidine 4 MG capsule Commonly known as: ZANAFLEX Take 4 mg by mouth 3 (three) times daily.   traMADol 50 MG tablet Commonly known as: ULTRAM Take 50 mg by mouth daily as needed for moderate pain.   Tyler Aas FlexTouch 200 UNIT/ML FlexTouch Pen Generic drug: insulin degludec 60 units once daily per patient.   trimethoprim-polymyxin b ophthalmic solution Commonly known as: POLYTRIM INSTILL 1 DROP INTO LEFT EYE 4 TIMES DAILY FOR 5 DAYS   VITAMIN B-12 IJ Inject 1,000 mg as directed every 30 (thirty) days. X 4       Allergies: No Known Allergies  Past Medical History, Surgical history, Social history, and Family History were reviewed and updated.  Review of Systems: All other 10 point review of systems is negative.   Physical Exam:  weight is 237 lb 12.8 oz (107.9 kg). His temperature is 98.4 F (36.9 C). His blood pressure is 108/78 and his pulse is 74. His respiration is 18 and oxygen saturation is 99%.   Wt Readings from Last 3 Encounters:  09/24/20 237 lb 12.8 oz (107.9 kg)  07/22/20 239 lb 1.9 oz (108.5  kg)  05/27/20 240 lb 12.8 oz (109.2 kg)    Ocular: Sclerae unicteric, pupils equal, round and reactive to light Ear-nose-throat: Oropharynx clear, dentition fair Lymphatic: No cervical or supraclavicular adenopathy Lungs no rales or rhonchi, good excursion bilaterally Heart regular rate and rhythm, no murmur appreciated Abd soft, nontender, positive bowel sounds MSK no focal spinal tenderness, no joint edema Neuro: non-focal, well-oriented, appropriate affect Breasts: Deferred   Lab Results  Component Value Date   WBC 7.4 09/24/2020   HGB 12.4 (L) 09/24/2020   HCT 40.1 09/24/2020   MCV 87.7 09/24/2020   PLT 344 09/24/2020   Lab Results  Component Value Date   FERRITIN 259 07/22/2020   IRON 45 07/22/2020   TIBC 303 07/22/2020  UIBC 257 07/22/2020   IRONPCTSAT 15 (L) 07/22/2020   Lab Results  Component Value Date   RETICCTPCT 1.6 09/24/2020   RBC 4.59 09/24/2020   RETICCTABS 63.0 06/20/2015   No results found for: Nils Pyle [facility] Lab Results  Component Value Date   IGA 139 08/26/2017   No results found for: Odetta Pink, SPEI   Chemistry      Component Value Date/Time   NA 138 09/24/2020 1314   NA 141 09/25/2016 0809   K 4.4 09/24/2020 1314   K 2.8 (LL) 09/25/2016 0809   CL 102 09/24/2020 1314   CL 102 07/31/2016 0815   CO2 32 09/24/2020 1314   CO2 28 09/25/2016 0809   BUN 10 09/24/2020 1314   BUN 13.1 09/25/2016 0809   CREATININE 1.41 (H) 09/24/2020 1314   CREATININE 1.2 09/25/2016 0809      Component Value Date/Time   CALCIUM 10.8 (H) 09/24/2020 1314   CALCIUM 9.5 09/25/2016 0809   ALKPHOS 92 09/24/2020 1314   ALKPHOS 126 09/25/2016 0809   AST 17 09/24/2020 1314   AST 19 09/25/2016 0809   ALT 18 09/24/2020 1314   ALT 23 09/25/2016 0809   BILITOT 0.3 09/24/2020 1314   BILITOT 0.45 09/25/2016 0809       Impression and Plan: Mr. Abshier is a very pleasant 65yo African  American gentleman withmultifactorial anemiaincluding chronic iron deficiency. B 12 given today.  Iron studies are pending. We will replace if needed.  B 12 monthly with follow-up in 2 months.  He was encouraged to contact our office with any questions or concerns.   Laverna Peace, NP 3/15/20222:00 PM

## 2020-09-25 ENCOUNTER — Other Ambulatory Visit: Payer: Self-pay | Admitting: Family

## 2020-09-25 ENCOUNTER — Telehealth: Payer: Self-pay

## 2020-09-25 LAB — FERRITIN: Ferritin: 192 ng/mL (ref 24–336)

## 2020-09-25 LAB — IRON AND TIBC
Iron: 36 ug/dL — ABNORMAL LOW (ref 42–163)
Saturation Ratios: 12 % — ABNORMAL LOW (ref 20–55)
TIBC: 295 ug/dL (ref 202–409)
UIBC: 259 ug/dL (ref 117–376)

## 2020-09-25 NOTE — Telephone Encounter (Signed)
Pt called and is aware of his iron tx appts  [name]

## 2020-09-27 ENCOUNTER — Inpatient Hospital Stay: Payer: Medicare Other

## 2020-09-27 ENCOUNTER — Other Ambulatory Visit: Payer: Self-pay

## 2020-09-27 VITALS — BP 91/57 | HR 56 | Temp 98.8°F | Resp 17

## 2020-09-27 DIAGNOSIS — D509 Iron deficiency anemia, unspecified: Secondary | ICD-10-CM

## 2020-09-27 MED ORDER — SODIUM CHLORIDE 0.9 % IV SOLN
125.0000 mg | Freq: Once | INTRAVENOUS | Status: AC
  Administered 2020-09-27: 125 mg via INTRAVENOUS
  Filled 2020-09-27: qty 10

## 2020-09-27 MED ORDER — SODIUM CHLORIDE 0.9 % IV SOLN
Freq: Once | INTRAVENOUS | Status: AC
  Filled 2020-09-27: qty 250

## 2020-09-27 NOTE — Progress Notes (Signed)
Pt declined to stay for post infusion observation period. Pt stated he has tolerated medication multiple times prior without difficulty. Pt aware to call clinic with any questions or concerns. Pt verbalized understanding and had no further questions.   

## 2020-09-27 NOTE — Patient Instructions (Signed)
Sodium Ferric Gluconate Complex injection What is this medicine? SODIUM FERRIC GLUCONATE COMPLEX (SOE dee um FER ik GLOO koe nate KOM pleks) is an iron replacement. It is used with epoetin therapy to treat low iron levels in patients who are receiving hemodialysis. This medicine may be used for other purposes; ask your health care provider or pharmacist if you have questions. COMMON BRAND NAME(S): Ferrlecit, Nulecit What should I tell my health care provider before I take this medicine? They need to know if you have any of the following conditions:  anemia that is not from iron deficiency  high levels of iron in the body  an unusual or allergic reaction to iron, benzyl alcohol, other medicines, foods, dyes, or preservatives  pregnant or are trying to become pregnant  breast-feeding How should I use this medicine? This medicine is for infusion into a vein. It is given by a health care professional in a hospital or clinic setting. Talk to your pediatrician regarding the use of this medicine in children. While this drug may be prescribed for children as young as 6 years old for selected conditions, precautions do apply. Overdosage: If you think you have taken too much of this medicine contact a poison control center or emergency room at once. NOTE: This medicine is only for you. Do not share this medicine with others. What if I miss a dose? It is important not to miss your dose. Call your doctor or health care professional if you are unable to keep an appointment. What may interact with this medicine? Do not take this medicine with any of the following medications:  deferoxamine  dimercaprol  other iron products This medicine may also interact with the following medications:  chloramphenicol  deferasirox  medicine for blood pressure like enalapril This list may not describe all possible interactions. Give your health care provider a list of all the medicines, herbs,  non-prescription drugs, or dietary supplements you use. Also tell them if you smoke, drink alcohol, or use illegal drugs. Some items may interact with your medicine. What should I watch for while using this medicine? Your condition will be monitored carefully while you are receiving this medicine. Visit your doctor for check-ups as directed. What side effects may I notice from receiving this medicine? Side effects that you should report to your doctor or health care professional as soon as possible:  allergic reactions like skin rash, itching or hives, swelling of the face, lips, or tongue  breathing problems  changes in hearing  changes in vision  chills, flushing, or sweating  fast, irregular heartbeat  feeling faint or lightheaded, falls  fever, flu-like symptoms  high or low blood pressure  pain, tingling, numbness in the hands or feet  severe pain in the chest, back, flanks, or groin  swelling of the ankles, feet, hands  trouble passing urine or change in the amount of urine  unusually weak or tired Side effects that usually do not require medical attention (report to your doctor or health care professional if they continue or are bothersome):  cramps  dark colored stools  diarrhea  headache  nausea, vomiting  stomach upset This list may not describe all possible side effects. Call your doctor for medical advice about side effects. You may report side effects to FDA at 1-800-FDA-1088. Where should I keep my medicine? This drug is given in a hospital or clinic and will not be stored at home. NOTE: This sheet is a summary. It may not cover all   possible information. If you have questions about this medicine, talk to your doctor, pharmacist, or health care provider.  2021 Elsevier/Gold Standard (2008-02-29 15:58:57)   

## 2020-10-02 ENCOUNTER — Inpatient Hospital Stay: Payer: Medicare Other

## 2020-10-02 ENCOUNTER — Other Ambulatory Visit: Payer: Self-pay

## 2020-10-02 VITALS — BP 125/85 | HR 89 | Temp 98.4°F | Resp 18

## 2020-10-02 DIAGNOSIS — D509 Iron deficiency anemia, unspecified: Secondary | ICD-10-CM

## 2020-10-02 MED ORDER — SODIUM CHLORIDE 0.9 % IV SOLN
Freq: Once | INTRAVENOUS | Status: AC
  Filled 2020-10-02: qty 250

## 2020-10-02 MED ORDER — SODIUM CHLORIDE 0.9 % IV SOLN
125.0000 mg | Freq: Once | INTRAVENOUS | Status: AC
  Administered 2020-10-02: 125 mg via INTRAVENOUS
  Filled 2020-10-02: qty 10

## 2020-10-02 NOTE — Patient Instructions (Signed)
Sodium Ferric Gluconate Complex injection What is this medicine? SODIUM FERRIC GLUCONATE COMPLEX (SOE dee um FER ik GLOO koe nate KOM pleks) is an iron replacement. It is used with epoetin therapy to treat low iron levels in patients who are receiving hemodialysis. This medicine may be used for other purposes; ask your health care provider or pharmacist if you have questions. COMMON BRAND NAME(S): Ferrlecit, Nulecit What should I tell my health care provider before I take this medicine? They need to know if you have any of the following conditions:  anemia that is not from iron deficiency  high levels of iron in the body  an unusual or allergic reaction to iron, benzyl alcohol, other medicines, foods, dyes, or preservatives  pregnant or are trying to become pregnant  breast-feeding How should I use this medicine? This medicine is for infusion into a vein. It is given by a health care professional in a hospital or clinic setting. Talk to your pediatrician regarding the use of this medicine in children. While this drug may be prescribed for children as young as 6 years old for selected conditions, precautions do apply. Overdosage: If you think you have taken too much of this medicine contact a poison control center or emergency room at once. NOTE: This medicine is only for you. Do not share this medicine with others. What if I miss a dose? It is important not to miss your dose. Call your doctor or health care professional if you are unable to keep an appointment. What may interact with this medicine? Do not take this medicine with any of the following medications:  deferoxamine  dimercaprol  other iron products This medicine may also interact with the following medications:  chloramphenicol  deferasirox  medicine for blood pressure like enalapril This list may not describe all possible interactions. Give your health care provider a list of all the medicines, herbs,  non-prescription drugs, or dietary supplements you use. Also tell them if you smoke, drink alcohol, or use illegal drugs. Some items may interact with your medicine. What should I watch for while using this medicine? Your condition will be monitored carefully while you are receiving this medicine. Visit your doctor for check-ups as directed. What side effects may I notice from receiving this medicine? Side effects that you should report to your doctor or health care professional as soon as possible:  allergic reactions like skin rash, itching or hives, swelling of the face, lips, or tongue  breathing problems  changes in hearing  changes in vision  chills, flushing, or sweating  fast, irregular heartbeat  feeling faint or lightheaded, falls  fever, flu-like symptoms  high or low blood pressure  pain, tingling, numbness in the hands or feet  severe pain in the chest, back, flanks, or groin  swelling of the ankles, feet, hands  trouble passing urine or change in the amount of urine  unusually weak or tired Side effects that usually do not require medical attention (report to your doctor or health care professional if they continue or are bothersome):  cramps  dark colored stools  diarrhea  headache  nausea, vomiting  stomach upset This list may not describe all possible side effects. Call your doctor for medical advice about side effects. You may report side effects to FDA at 1-800-FDA-1088. Where should I keep my medicine? This drug is given in a hospital or clinic and will not be stored at home. NOTE: This sheet is a summary. It may not cover all   possible information. If you have questions about this medicine, talk to your doctor, pharmacist, or health care provider.  2021 Elsevier/Gold Standard (2008-02-29 15:58:57)   

## 2020-10-22 ENCOUNTER — Ambulatory Visit: Payer: Medicare Other

## 2020-10-28 ENCOUNTER — Other Ambulatory Visit: Payer: Self-pay | Admitting: Internal Medicine

## 2020-10-31 ENCOUNTER — Telehealth: Payer: Self-pay | Admitting: *Deleted

## 2020-10-31 NOTE — Telephone Encounter (Signed)
Called and unable to lvm about rescheduling upcoming appointments - mailed calendar

## 2020-11-22 ENCOUNTER — Ambulatory Visit: Payer: Medicare Other

## 2020-11-22 ENCOUNTER — Other Ambulatory Visit: Payer: Medicare Other

## 2020-11-22 ENCOUNTER — Ambulatory Visit: Payer: Medicare Other | Admitting: Family

## 2020-11-29 ENCOUNTER — Inpatient Hospital Stay (HOSPITAL_BASED_OUTPATIENT_CLINIC_OR_DEPARTMENT_OTHER): Payer: Medicare Other | Admitting: Family

## 2020-11-29 ENCOUNTER — Telehealth: Payer: Self-pay

## 2020-11-29 ENCOUNTER — Inpatient Hospital Stay: Payer: Medicare Other | Attending: Family

## 2020-11-29 ENCOUNTER — Encounter: Payer: Self-pay | Admitting: Family

## 2020-11-29 ENCOUNTER — Other Ambulatory Visit: Payer: Self-pay

## 2020-11-29 ENCOUNTER — Inpatient Hospital Stay: Payer: Medicare Other

## 2020-11-29 VITALS — BP 101/62 | HR 87 | Temp 98.1°F | Resp 18 | Ht 66.0 in | Wt 240.1 lb

## 2020-11-29 DIAGNOSIS — D509 Iron deficiency anemia, unspecified: Secondary | ICD-10-CM | POA: Diagnosis not present

## 2020-11-29 DIAGNOSIS — D500 Iron deficiency anemia secondary to blood loss (chronic): Secondary | ICD-10-CM

## 2020-11-29 DIAGNOSIS — D510 Vitamin B12 deficiency anemia due to intrinsic factor deficiency: Secondary | ICD-10-CM

## 2020-11-29 DIAGNOSIS — D631 Anemia in chronic kidney disease: Secondary | ICD-10-CM

## 2020-11-29 LAB — CBC WITH DIFFERENTIAL (CANCER CENTER ONLY)
Abs Immature Granulocytes: 0.02 10*3/uL (ref 0.00–0.07)
Basophils Absolute: 0.1 10*3/uL (ref 0.0–0.1)
Basophils Relative: 1 %
Eosinophils Absolute: 0.2 10*3/uL (ref 0.0–0.5)
Eosinophils Relative: 3 %
HCT: 41.6 % (ref 39.0–52.0)
Hemoglobin: 13.1 g/dL (ref 13.0–17.0)
Immature Granulocytes: 0 %
Lymphocytes Relative: 34 %
Lymphs Abs: 2.3 10*3/uL (ref 0.7–4.0)
MCH: 27.3 pg (ref 26.0–34.0)
MCHC: 31.5 g/dL (ref 30.0–36.0)
MCV: 86.8 fL (ref 80.0–100.0)
Monocytes Absolute: 0.4 10*3/uL (ref 0.1–1.0)
Monocytes Relative: 6 %
Neutro Abs: 3.7 10*3/uL (ref 1.7–7.7)
Neutrophils Relative %: 56 %
Platelet Count: 312 10*3/uL (ref 150–400)
RBC: 4.79 MIL/uL (ref 4.22–5.81)
RDW: 16.3 % — ABNORMAL HIGH (ref 11.5–15.5)
WBC Count: 6.6 10*3/uL (ref 4.0–10.5)
nRBC: 0 % (ref 0.0–0.2)

## 2020-11-29 LAB — RETICULOCYTES
Immature Retic Fract: 16.9 % — ABNORMAL HIGH (ref 2.3–15.9)
RBC.: 4.79 MIL/uL (ref 4.22–5.81)
Retic Count, Absolute: 77.6 10*3/uL (ref 19.0–186.0)
Retic Ct Pct: 1.6 % (ref 0.4–3.1)

## 2020-11-29 LAB — CMP (CANCER CENTER ONLY)
ALT: 31 U/L (ref 0–44)
AST: 21 U/L (ref 15–41)
Albumin: 4.4 g/dL (ref 3.5–5.0)
Alkaline Phosphatase: 102 U/L (ref 38–126)
Anion gap: 8 (ref 5–15)
BUN: 14 mg/dL (ref 8–23)
CO2: 27 mmol/L (ref 22–32)
Calcium: 11 mg/dL — ABNORMAL HIGH (ref 8.9–10.3)
Chloride: 100 mmol/L (ref 98–111)
Creatinine: 1.38 mg/dL — ABNORMAL HIGH (ref 0.61–1.24)
GFR, Estimated: 57 mL/min — ABNORMAL LOW (ref >60)
Glucose, Bld: 183 mg/dL — ABNORMAL HIGH (ref 70–99)
Potassium: 4.3 mmol/L (ref 3.5–5.1)
Sodium: 135 mmol/L (ref 135–145)
Total Bilirubin: 0.3 mg/dL (ref 0.3–1.2)
Total Protein: 8 g/dL (ref 6.5–8.1)

## 2020-11-29 LAB — VITAMIN B12: Vitamin B-12: 633 pg/mL (ref 180–914)

## 2020-11-29 MED ORDER — CYANOCOBALAMIN 1000 MCG/ML IJ SOLN
1000.0000 ug | Freq: Once | INTRAMUSCULAR | Status: AC
  Administered 2020-11-29: 1000 ug via INTRAMUSCULAR

## 2020-11-29 MED ORDER — CYANOCOBALAMIN 1000 MCG/ML IJ SOLN
INTRAMUSCULAR | Status: AC
  Filled 2020-11-29: qty 1

## 2020-11-29 NOTE — Telephone Encounter (Signed)
appts made and calendars mailed to pt per 11/29/20 los   James Zuniga

## 2020-11-29 NOTE — Patient Instructions (Signed)

## 2020-11-29 NOTE — Progress Notes (Signed)
Hematology and Oncology Follow Up Visit  James Zuniga [id number] 1955-09-15 65 y.o. 11/29/2020   Principle Diagnosis:  Iron deficiency anemia Hypotestosteronemia Chronic liver lesions Pernicious anemia  Of Note: History of stroke in June 2018, no more Testosterone or ESA's  Current Therapy: IV iron as indicated  Vit B12 1 mg IM q month   Interim History:  James Zuniga is here today for follow-up and B 12 injection. He is doing well but notes fatigue at times.  He states that he has to go next week to see electrophysiology about changing the battery in his pacemaker.  He has mild SOB and chest discomfort with over exertion and will take a break to rest if needed.  No fever, chills, n/v, cough, rash, dizziness, palpitations, abdominal pain or changes in bowel or bladder habits.  He has gout in his lower extremities with some mild swelling. He is currently on Colchicine and Allopurinol and can not tell much difference. He does get relief from Voltaren gel.  No falls or syncope to report. He ambulates with a cane for added support.  He has maintained a good appetite and is staying well hydrated. His weight is stable at 240 lbs.   ECOG Performance Status: 1 - Symptomatic but completely ambulatory  Medications:  Allergies as of 11/29/2020   No Known Allergies     Medication List       Accurate as of Nov 29, 2020 12:57 PM. If you have any questions, ask your nurse or doctor.        albuterol 108 (90 Base) MCG/ACT inhaler Commonly known as: VENTOLIN HFA Inhale 2 puffs into the lungs every 6 (six) hours as needed for wheezing or shortness of breath.   allopurinol 300 MG tablet Commonly known as: ZYLOPRIM Take 300 mg by mouth daily.   amLODipine 5 MG tablet Commonly known as: NORVASC Take 5 mg by mouth daily.   Anoro Ellipta 62.5-25 MCG/INH Aepb Generic drug: umeclidinium-vilanterol Inhale 1 puff into the lungs daily.   atorvastatin 80 MG tablet Commonly known  as: LIPITOR Take 1 tablet (80 mg total) by mouth every morning.   clopidogrel 75 MG tablet Commonly known as: PLAVIX Take 1 tablet (75 mg total) by mouth daily.   diclofenac sodium 1 % Gel Commonly known as: VOLTAREN Use 4 grams to knees tid prn   DULoxetine 60 MG capsule Commonly known as: CYMBALTA Take 60 mg by mouth 2 (two) times daily.   EQ Aspirin Low Dose 81 MG chewable tablet Generic drug: aspirin Chew 81 mg by mouth every morning.   Fifty50 Glucose Meter 2.0 w/Device Kit To check blood sugars TID or Use as instructed Include strips. Lancets, lancet device, control solution. batteries   Fifty50 Pen Needles 32G X 4 MM Misc Generic drug: Insulin Pen Needle 1 each by Misc.(Non-Drug; Combo Route) route daily.   furosemide 20 MG tablet Commonly known as: LASIX Take 20 mg by mouth every Tuesday, Thursday, and Saturday at 6 PM.   gabapentin 300 MG capsule Commonly known as: NEURONTIN Take 300 mg by mouth 4 (four) times daily.   glucose blood test strip 1 each by Misc.(Non-Drug; Combo Route) route daily.   insulin NPH-regular Human (70-30) 100 UNIT/ML injection Commonly known as: NovoLIN 70/30 Please inject 25 units twice daily before breakfast and dinner. What changed:   how much to take  how to take this  when to take this  additional instructions   insulin regular 100 units/mL injection Commonly known  as: NOVOLIN R Use as sliding scale up to 10 units tid with meals   ipratropium-albuterol 0.5-2.5 (3) MG/3ML Soln Commonly known as: DUONEB USE 1 VIAL IN NEBULIZER 4 TIMES DAILY   losartan 100 MG tablet Commonly known as: COZAAR Take 100 mg by mouth daily.   metoprolol 200 MG 24 hr tablet Commonly known as: TOPROL-XL Take 200 mg by mouth daily.   onetouch ultrasoft lancets USE 1 LANCET TO CHECK GLUCOSE IN THE MORNING BEFORE BREAKFAST   pantoprazole 20 MG tablet Commonly known as: PROTONIX Take 20 mg by mouth daily.   potassium chloride SA 20 MEQ  tablet Commonly known as: KLOR-CON Take 1 tablet (20 mEq total) by mouth 2 (two) times daily. What changed:   when to take this  additional instructions   primidone 50 MG tablet Commonly known as: MYSOLINE Take 100 mg by mouth at bedtime.   sildenafil 100 MG tablet Commonly known as: VIAGRA Take by mouth.   spironolactone 25 MG tablet Commonly known as: ALDACTONE Take 25 mg by mouth daily.   tamsulosin 0.4 MG Caps capsule Commonly known as: FLOMAX Take 1 capsule (0.4 mg total) by mouth daily after breakfast.   tiZANidine 4 MG tablet Commonly known as: ZANAFLEX Take 4 mg by mouth 3 (three) times daily as needed for muscle spasms.   tiZANidine 4 MG capsule Commonly known as: ZANAFLEX Take 4 mg by mouth 3 (three) times daily.   traMADol 50 MG tablet Commonly known as: ULTRAM Take 50 mg by mouth daily as needed for moderate pain.   Tyler Aas FlexTouch 200 UNIT/ML FlexTouch Pen Generic drug: insulin degludec 60 units once daily per patient.   trimethoprim-polymyxin b ophthalmic solution Commonly known as: POLYTRIM INSTILL 1 DROP INTO LEFT EYE 4 TIMES DAILY FOR 5 DAYS   VITAMIN B-12 IJ Inject 1,000 mg as directed every 30 (thirty) days. X 4       Allergies: No Known Allergies  Past Medical History, Surgical history, Social history, and Family History were reviewed and updated.  Review of Systems: All other 10 point review of systems is negative.   Physical Exam:  vitals were not taken for this visit.   Wt Readings from Last 3 Encounters:  09/24/20 237 lb 12.8 oz (107.9 kg)  07/22/20 239 lb 1.9 oz (108.5 kg)  05/27/20 240 lb 12.8 oz (109.2 kg)    Ocular: Sclerae unicteric, pupils equal, round and reactive to light Ear-nose-throat: Oropharynx clear, dentition fair Lymphatic: No cervical or supraclavicular adenopathy Lungs no rales or rhonchi, good excursion bilaterally Heart regular rate and rhythm, no murmur appreciated Abd soft, nontender, positive  bowel sounds MSK no focal spinal tenderness, no joint edema Neuro: non-focal, well-oriented, appropriate affect Breasts: Deferred   Lab Results  Component Value Date   WBC 7.4 09/24/2020   HGB 12.4 (L) 09/24/2020   HCT 40.1 09/24/2020   MCV 87.7 09/24/2020   PLT 344 09/24/2020   Lab Results  Component Value Date   FERRITIN 192 09/24/2020   IRON 36 (L) 09/24/2020   TIBC 295 09/24/2020   UIBC 259 09/24/2020   IRONPCTSAT 12 (L) 09/24/2020   Lab Results  Component Value Date   RETICCTPCT 1.6 09/24/2020   RBC 4.59 09/24/2020   RETICCTABS 63.0 06/20/2015   No results found for: KPAFRELGTCHN, LAMBDASER, KAPLAMBRATIO Lab Results  Component Value Date   IGA 139 08/26/2017   No results found for: TOTALPROTELP, ALBUMINELP, A1GS, A2GS, BETS, BETA2SER, [place], [place], SPEI   Chemistry  Component Value Date/Time   NA 138 09/24/2020 1314   NA 141 09/25/2016 0809   K 4.4 09/24/2020 1314   K 2.8 (LL) 09/25/2016 0809   CL 102 09/24/2020 1314   CL 102 07/31/2016 0815   CO2 32 09/24/2020 1314   CO2 28 09/25/2016 0809   BUN 10 09/24/2020 1314   BUN 13.1 09/25/2016 0809   CREATININE 1.41 (H) 09/24/2020 1314   CREATININE 1.2 09/25/2016 0809      Component Value Date/Time   CALCIUM 10.8 (H) 09/24/2020 1314   CALCIUM 9.5 09/25/2016 0809   ALKPHOS 92 09/24/2020 1314   ALKPHOS 126 09/25/2016 0809   AST 17 09/24/2020 1314   AST 19 09/25/2016 0809   ALT 18 09/24/2020 1314   ALT 23 09/25/2016 0809   BILITOT 0.3 09/24/2020 1314   BILITOT 0.45 09/25/2016 0809       Impression and Plan: Mr. Boettcher is a very pleasant 65yo African American gentleman withmultifactorial anemiaincluding chronic iron deficiency. B 12 injection given.  Iron studies are pending. We will replace if needed.  B 12 monthly with follow-up in 2 months.  He can contact our office with any questions or concerns.   Laverna Peace, NP 5/20/202212:57 PM

## 2020-12-02 LAB — FERRITIN: Ferritin: 340 ng/mL — ABNORMAL HIGH (ref 24–336)

## 2020-12-02 LAB — IRON AND TIBC
Iron: 60 ug/dL (ref 42–163)
Saturation Ratios: 19 % — ABNORMAL LOW (ref 20–55)
TIBC: 321 ug/dL (ref 202–409)
UIBC: 261 ug/dL (ref 117–376)

## 2020-12-03 ENCOUNTER — Telehealth: Payer: Self-pay | Admitting: *Deleted

## 2020-12-03 NOTE — Telephone Encounter (Signed)
Called and gave upcoming appointment (1) dose of IV Iron

## 2020-12-04 ENCOUNTER — Other Ambulatory Visit: Payer: Self-pay

## 2020-12-04 ENCOUNTER — Inpatient Hospital Stay: Payer: Medicare Other

## 2020-12-04 ENCOUNTER — Telehealth: Payer: Self-pay

## 2020-12-04 VITALS — BP 107/75 | HR 65 | Resp 18

## 2020-12-04 DIAGNOSIS — D509 Iron deficiency anemia, unspecified: Secondary | ICD-10-CM | POA: Diagnosis not present

## 2020-12-04 MED ORDER — SODIUM CHLORIDE 0.9 % IV SOLN
Freq: Once | INTRAVENOUS | Status: AC
Start: 2020-12-04 — End: 2020-12-04
  Filled 2020-12-04: qty 250

## 2020-12-04 MED ORDER — SODIUM CHLORIDE 0.9 % IV SOLN
125.0000 mg | Freq: Once | INTRAVENOUS | Status: AC
  Administered 2020-12-04: 125 mg via INTRAVENOUS
  Filled 2020-12-04: qty 10

## 2020-12-04 NOTE — Patient Instructions (Signed)
Sodium Ferric Gluconate Complex injection What is this medicine? SODIUM FERRIC GLUCONATE COMPLEX (SOE dee um FER ik GLOO koe nate KOM pleks) is an iron replacement. It is used with epoetin therapy to treat low iron levels in patients who are receiving hemodialysis. This medicine may be used for other purposes; ask your health care provider or pharmacist if you have questions. COMMON BRAND NAME(S): Ferrlecit, Nulecit What should I tell my health care provider before I take this medicine? They need to know if you have any of the following conditions:  anemia that is not from iron deficiency  high levels of iron in the body  an unusual or allergic reaction to iron, benzyl alcohol, other medicines, foods, dyes, or preservatives  pregnant or are trying to become pregnant  breast-feeding How should I use this medicine? This medicine is for infusion into a vein. It is given by a health care professional in a hospital or clinic setting. Talk to your pediatrician regarding the use of this medicine in children. While this drug may be prescribed for children as young as 6 years old for selected conditions, precautions do apply. Overdosage: If you think you have taken too much of this medicine contact a poison control center or emergency room at once. NOTE: This medicine is only for you. Do not share this medicine with others. What if I miss a dose? It is important not to miss your dose. Call your doctor or health care professional if you are unable to keep an appointment. What may interact with this medicine? Do not take this medicine with any of the following medications:  deferoxamine  dimercaprol  other iron products This medicine may also interact with the following medications:  chloramphenicol  deferasirox  medicine for blood pressure like enalapril This list may not describe all possible interactions. Give your health care provider a list of all the medicines, herbs,  non-prescription drugs, or dietary supplements you use. Also tell them if you smoke, drink alcohol, or use illegal drugs. Some items may interact with your medicine. What should I watch for while using this medicine? Your condition will be monitored carefully while you are receiving this medicine. Visit your doctor for check-ups as directed. What side effects may I notice from receiving this medicine? Side effects that you should report to your doctor or health care professional as soon as possible:  allergic reactions like skin rash, itching or hives, swelling of the face, lips, or tongue  breathing problems  changes in hearing  changes in vision  chills, flushing, or sweating  fast, irregular heartbeat  feeling faint or lightheaded, falls  fever, flu-like symptoms  high or low blood pressure  pain, tingling, numbness in the hands or feet  severe pain in the chest, back, flanks, or groin  swelling of the ankles, feet, hands  trouble passing urine or change in the amount of urine  unusually weak or tired Side effects that usually do not require medical attention (report to your doctor or health care professional if they continue or are bothersome):  cramps  dark colored stools  diarrhea  headache  nausea, vomiting  stomach upset This list may not describe all possible side effects. Call your doctor for medical advice about side effects. You may report side effects to FDA at 1-800-FDA-1088. Where should I keep my medicine? This drug is given in a hospital or clinic and will not be stored at home. NOTE: This sheet is a summary. It may not cover all   possible information. If you have questions about this medicine, talk to your doctor, pharmacist, or health care provider.  2021 Elsevier/Gold Standard (2008-02-29 15:58:57)   

## 2020-12-04 NOTE — Telephone Encounter (Signed)
Pts wife called to r/s  inj appt  Chozen Latulippe

## 2020-12-18 ENCOUNTER — Telehealth: Payer: Self-pay | Admitting: *Deleted

## 2020-12-18 NOTE — Telephone Encounter (Signed)
Call received from patient's wife wanting to know if patient can move his appt for Vit B 12 injection from 12/30/20 to 12/25/20.  Pt.'s wife notified that pt can move his appts to 12/25/20.  Message sent to scheduling and pharmacy notified.

## 2020-12-25 ENCOUNTER — Other Ambulatory Visit: Payer: Self-pay

## 2020-12-25 ENCOUNTER — Inpatient Hospital Stay: Payer: Medicare Other | Attending: Family

## 2020-12-25 VITALS — BP 103/72 | HR 71 | Temp 98.0°F | Resp 16

## 2020-12-25 DIAGNOSIS — D509 Iron deficiency anemia, unspecified: Secondary | ICD-10-CM

## 2020-12-25 DIAGNOSIS — D510 Vitamin B12 deficiency anemia due to intrinsic factor deficiency: Secondary | ICD-10-CM | POA: Diagnosis not present

## 2020-12-25 MED ORDER — CYANOCOBALAMIN 1000 MCG/ML IJ SOLN
1000.0000 ug | Freq: Once | INTRAMUSCULAR | Status: AC
  Administered 2020-12-25: 1000 ug via INTRAMUSCULAR

## 2020-12-25 MED ORDER — CYANOCOBALAMIN 1000 MCG/ML IJ SOLN
INTRAMUSCULAR | Status: AC
  Filled 2020-12-25: qty 1

## 2020-12-25 NOTE — Patient Instructions (Signed)
Cyanocobalamin, Vitamin B12 injection O que  este medicamento? A CIANOCOBALAMINA  uma forma sinttica de vitamina B12. A vitamina B12  essencial para o desenvolvimento de glbulos sanguneos, clulas nervosas e protenas saudveis pelo organismo. Tambm ajuda no metabolismo das gorduras e carboidratos. Este medicamento  usado para tratar pessoas que no conseguem absorver vitamina B12 suficiente. Este medicamento pode ser usado para outros propsitos; em caso de dvidas, pergunte ao seu profissional de sade ou farmacutico. NOMES DE MARCAS COMUNS: B-12 Compliance Kit, B-12 Injection Kit, Cyomin, LA-12, Nutri-Twelve, Physicians EZ Use B-12, Primabalt O que devo dizer a meu profissional de sade antes de tomar este medicamento? Precisam saber se voc tem algum dos seguintes problemas ou estados de sade: doenas renais sndrome ou doena de [place] anemia megaloblstica reao estranha ou alergia  cianocobalamina ou ao cobalto reao estranha ou alergia a outros medicamentos, alimentos, corantes ou conservantes est grvida ou tentando engravidar est amamentando Como devo usar este medicamento? Este medicamento  injetado por via intramuscular ou por injeo subcutnea profunda. Costuma ser administrado por um profissional de sade em consultrio ou Chief of Staff. Porm,  possvel que seu mdico lhe ensine como aplicar suas prprias injees. Siga todas as instrues. Fale com seu pediatra a respeito do uso deste medicamento em crianas. Pode ser preciso tomar alguns cuidados especiais. Superdosagem: Se achar que tomou uma superdosagem deste medicamento, entre em contato imediatamente com o [facility] ou v a [employer]. OBSERVAO: Este medicamento  s para voc. No compartilhe este medicamento com outras pessoas. E se eu deixar de tomar uma dose? Se toma o medicamento em uma clnica ou no consultrio do seu mdico, ligue para Paramedic a Passenger transport manager. Se aplica as  injees por conta prpria e perdeu uma dose, tome-a assim que possvel. Se j estiver quase na hora da sua prxima dose, tome somente essa dose. No tome o remdio em dobro, nem tome uma dose adicional. O que pode interagir com este medicamento? colchicina consumo pesado de lcool Esta lista pode no descrever todas as interaes possveis. D ao seu profissional de sade uma lista de todos os medicamentos, ervas medicinais, remdios de venda livre, ou suplementos alimentares que voc [place]. Diga tambm se voc fuma, bebe, ou [place] drogas ilcitas. Alguns destes podem interagir com o seu medicamento. Ao que devo ficar atento quando estiver [employer] medicamento? Consulte seu mdico ou profissional de sade para acompanhamento regular Museum/gallery curator. Voc precisar fazer exames de sangue peridicos enquanto estiver [employer]. Voc pode precisar seguir uma dieta especial. Fale com seu mdico. Para conseguir o mximo benefcio deste medicamento, limite o seu consumo de lcool e evite fumar. Que efeitos colaterais posso sentir aps usar este medicamento? Efeitos colaterais que devem ser informados ao seu mdico ou profissional de sade o mais rpido possvel: reaes alrgicas, como erupo na pele, coceira, urticria, ou inchao do rosto, dos lbios ou da lngua pele azulada dor ou aperto no peito chiado no peito ou dificuldade para respirar tontura rea vermelha, inchada e dolorosa na perna Efeitos colaterais que normalmente no precisam de cuidados mdicos (avise ao seu mdico ou profissional de sade se persistirem ou forem incmodos): diarreia dor de cabea Esta lista pode no descrever todos os efeitos colaterais possveis. Para mais orientaes sobre efeitos colaterais, consulte o seu mdico. Voc pode relatar a ocorrncia de efeitos colaterais  FDA pelo telefone [phone]. Onde devo guardar meu medicamento? [facility] fora do [employer]. Conservar em [employer], entre 15 e 30 degreesC (  59 e 86 degreesF). Proteger Administrator, arts. Descartar qualquer medicamento no utilizado aps a data de validade impressa no rtulo ou embalagem. OBSERVAO: Este folheto  um resumo. Pode no cobrir todas as informaes possveis. Se tiver dvidas a respeito deste medicamento, fale com seu mdico, farmacutico ou profissional de sade.  2021 Elsevier/Gold Standard (2010-04-02 00:00:00)

## 2020-12-27 ENCOUNTER — Ambulatory Visit: Payer: Medicare Other

## 2020-12-30 ENCOUNTER — Ambulatory Visit: Payer: Medicare Other

## 2021-01-24 ENCOUNTER — Ambulatory Visit: Payer: Medicare Other

## 2021-01-24 ENCOUNTER — Other Ambulatory Visit: Payer: Medicare Other

## 2021-01-24 ENCOUNTER — Ambulatory Visit: Payer: Medicare Other | Admitting: Family

## 2021-02-03 ENCOUNTER — Telehealth: Payer: Self-pay

## 2021-02-13 ENCOUNTER — Ambulatory Visit (HOSPITAL_COMMUNITY)
Admission: RE | Admit: 2021-02-13 | Discharge: 2021-02-13 | Disposition: A | Discharge status: Home / Self Care | Payer: Medicare Other | Source: Ambulatory Visit | Attending: Pulmonary Disease | Admitting: Pulmonary Disease

## 2021-02-13 ENCOUNTER — Ambulatory Visit: Payer: Medicare Other | Admitting: Pulmonary Disease

## 2021-02-13 ENCOUNTER — Encounter: Payer: Self-pay | Admitting: Pulmonary Disease

## 2021-02-13 ENCOUNTER — Other Ambulatory Visit: Payer: Self-pay

## 2021-02-13 VITALS — BP 102/70 | HR 67 | Temp 96.2°F | Ht 66.0 in | Wt 245.0 lb

## 2021-02-13 DIAGNOSIS — E669 Obesity, unspecified: Secondary | ICD-10-CM

## 2021-02-13 DIAGNOSIS — R0600 Dyspnea, unspecified: Secondary | ICD-10-CM

## 2021-02-13 DIAGNOSIS — G4733 Obstructive sleep apnea (adult) (pediatric): Secondary | ICD-10-CM | POA: Diagnosis not present

## 2021-02-13 DIAGNOSIS — G4730 Sleep apnea, unspecified: Secondary | ICD-10-CM | POA: Diagnosis not present

## 2021-02-13 DIAGNOSIS — J449 Chronic obstructive pulmonary disease, unspecified: Secondary | ICD-10-CM | POA: Diagnosis not present

## 2021-02-13 DIAGNOSIS — R0609 Other forms of dyspnea: Secondary | ICD-10-CM

## 2021-02-13 NOTE — Patient Instructions (Signed)
Will arrange for chest xray at [facility]  Will arrange for refitting of your CPAP mask  Will arrange for pulmonary function test in [place] and follow up in [place] in 4 to 6 weeks

## 2021-02-13 NOTE — Progress Notes (Signed)
[facility]  Chief Complaint  Patient presents with   Follow-up    Patient states that he is sleeping ok, feels like the pressure goes up and then sometimes feels like he has no pressure at all. Shortness of breath with exertion    Constitutional:  BP 102/70 (BP Location: Left Arm, Patient Position: Sitting, Cuff Size: Large)   Pulse 67   Temp (!) 96.2 F (35.7 C) (Oral)   Ht 5' 6"  (1.676 m)   Wt 245 lb (111.1 kg)   SpO2 100%   BMI 39.54 kg/m   Past Medical History:  CVA, ETOH, OA, DM, GERD, CHF, HLD, Gout, HTN, Low T, Vit D deficiency, Iron deficiency anemia  Past Surgical History:  He  has a past surgical history that includes Knee arthroscopy (Left); TEE without cardioversion (N/A, 12/24/2016); Loop recorder implant (06/2017); Esophagogastroduodenoscopy (N/A, 08/26/2017); Colonoscopy (N/A, 08/26/2017); enteroscopy (N/A, 05/24/2018); Hot hemostasis (N/A, 05/24/2018); and polypectomy (05/24/2018).  Brief Summary:  James Zuniga is a 66 y.o. male former smoker with obstructive sleep apnea and emphysema.      Subjective:   He has noticed more trouble with his breathing when he is walking.  Thinks he needs supplemental oxygen again.  Has intermittent cough with clear sputum.  Not having wheeze.  Uses CPAP nightly.  Mask leaks.  He has full face mask, but liked hybrid mask better.  He maintained SpO2 > 97% on room air while walking in office today.  Physical Exam:   Appearance - well kempt   ENMT - no sinus tenderness, no oral exudate, no LAN, Mallampati 3 airway, no stridor  Respiratory - equal breath sounds bilaterally, no wheezing or rales  CV - s1s2 regular rate and rhythm, no murmurs  Ext - no clubbing, no edema  Skin - no rashes  Psych - normal mood and affect   Pulmonary testing:  PFT 01/27/13 >> FEV1 2.50 (84%), FEV1% 83, TLC 4.06 (61%), DLCO 60%, no BD  Chest Imaging:  CT chest 03/02/13 >> mild paraseptal emphysema,  4 mm RUL nodule CT chest 07/26/15 >> stable RUL nodule  Sleep Tests:  PSG 04/21/19 >> AHI 15.5, SpO2 low 83% CPAP titration 05/17/19 >> CPAP 11 cm H2O CPAP 01/14/21 to 02/12/21 >> used on 26 of 30 nights with average 5 hrs 10 min.  Average AHI 1.5 with CPAP 11 cm H2O  Cardiac Tests:  Echo 12/25/16 >> EF 40 to 45%, PAS 33 mmHg, mild MR  Social History:  He  reports that he quit smoking about 3 years ago. His smoking use included cigarettes. He started smoking about 37 years ago. He has a 20.00 pack-year smoking history. He has never used smokeless tobacco. He reports that he does not drink alcohol and does not use drugs.  Family History:  His family history includes Diabetes in his father and sister; Heart attack in his brother and brother; Heart disease in his mother; Stroke in his brother and paternal uncle.     Assessment/Plan:  Dyspnea on exertion. - from CHF, COPD/emphysema, and obesity with deconditioning - has progressive symptoms - will arrange for chest xray and PFT; will schedule PFT to be done in [place] to expedite testing - discussed weight loss  Obstructive sleep apnea. - he is compliant with CPAP and reports benefit - he uses [facility] for his DME - continue CPAP 11 cm H2O - will have his DME refit him to a hybrid mask   COPD with  emphysema. 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 - followed by Dr. Otho Perl with cardiology at [facility]    Time Spent Involved in Patient Care on Day of Examination:  35 minutes  Follow up:   Patient Instructions  Will arrange for chest x[facility] at [facility]  Will arrange for refitting of your CPAP mask  Will arrange for pulmonary function test in [facility] and follow up in [place] in 4 to 6 weeks  Medication List:   Allergies as of 02/13/2021   No Known Allergies      Medication List        Accurate as  of February 13, 2021 11:39 AM. If you have any questions, ask your nurse or doctor.          STOP taking these medications    EQ Aspirin Low Dose 81 MG chewable tablet Generic drug: aspirin Stopped by: Chesley Mires, MD   sildenafil 100 MG tablet Commonly known as: VIAGRA Stopped by: Chesley Mires, MD       TAKE these medications    albuterol 108 (90 Base) MCG/ACT inhaler Commonly known as: VENTOLIN HFA Inhale 2 puffs into the lungs every 6 (six) hours as needed for wheezing or shortness of breath.   allopurinol 300 MG tablet Commonly known as: ZYLOPRIM Take 300 mg by mouth daily.   amLODipine 5 MG tablet Commonly known as: NORVASC Take 5 mg by mouth daily.   Anoro Ellipta 62.5-25 MCG/INH Aepb Generic drug: umeclidinium-vilanterol Inhale 1 puff into the lungs daily.   atorvastatin 80 MG tablet Commonly known as: LIPITOR Take 1 tablet (80 mg total) by mouth every morning.   clopidogrel 75 MG tablet Commonly known as: PLAVIX Take 1 tablet (75 mg total) by mouth daily.   diclofenac sodium 1 % Gel Commonly known as: VOLTAREN Use 4 grams to knees tid prn   DULoxetine 60 MG capsule Commonly known as: CYMBALTA Take 60 mg by mouth 2 (two) times daily.   Fifty50 Glucose Meter 2.0 w/Device Kit To check blood sugars TID or Use as instructed Include strips. Lancets, lancet device, control solution. batteries   Fifty50 Pen Needles 32G X 4 MM Misc Generic drug: Insulin Pen Needle 1 each by Misc.(Non-Drug; Combo Route) route daily.   furosemide 20 MG tablet Commonly known as: LASIX Take 20 mg by mouth every Tuesday, Thursday, and Saturday at 6 PM.   gabapentin 300 MG capsule Commonly known as: NEURONTIN Take 300 mg by mouth 4 (four) times daily.   glucose blood test strip 1 each by Misc.(Non-Drug; Combo Route) route daily.   insulin NPH-regular Human (70-30) 100 UNIT/ML injection Commonly known as: NovoLIN 70/30 Please inject 25 units twice daily before breakfast  and dinner. What changed:  how much to take how to take this when to take this additional instructions   insulin regular 100 units/mL injection Commonly known as: NOVOLIN R Use as sliding scale up to 10 units tid with meals   ipratropium-albuterol 0.5-2.5 (3) MG/3ML Soln Commonly known as: DUONEB USE 1 VIAL IN NEBULIZER 4 TIMES DAILY   losartan 100 MG tablet Commonly known as: COZAAR Take 100 mg by mouth daily.   metoprolol 200 MG 24 hr tablet Commonly known as: TOPROL-XL Take 200 mg by mouth daily.   onetouch ultrasoft lancets USE 1 LANCET TO CHECK GLUCOSE IN THE MORNING BEFORE BREAKFAST   pantoprazole 20 MG tablet  Commonly known as: PROTONIX Take 20 mg by mouth daily.   potassium chloride SA 20 MEQ tablet Commonly known as: KLOR-CON Take 1 tablet (20 mEq total) by mouth 2 (two) times daily. What changed:  when to take this additional instructions   primidone 50 MG tablet Commonly known as: MYSOLINE Take 100 mg by mouth at bedtime.   spironolactone 25 MG tablet Commonly known as: ALDACTONE Take 25 mg by mouth daily.   tamsulosin 0.4 MG Caps capsule Commonly known as: FLOMAX Take 1 capsule (0.4 mg total) by mouth daily after breakfast.   tiZANidine 4 MG tablet Commonly known as: ZANAFLEX Take 4 mg by mouth 3 (three) times daily as needed for muscle spasms.   traMADol 50 MG tablet Commonly known as: ULTRAM Take 50 mg by mouth daily as needed for moderate pain.   Tyler Aas FlexTouch 200 UNIT/ML FlexTouch Pen Generic drug: insulin degludec 63 Units. 60 units once daily per patient.   trimethoprim-polymyxin b ophthalmic solution Commonly known as: POLYTRIM INSTILL 1 DROP INTO LEFT EYE 4 TIMES DAILY FOR 5 DAYS   VITAMIN B-12 IJ Inject 1,000 mg as directed every 30 (thirty) days. X 4        Signature:  Chesley Mires, MD [place] Pager - [phone] 02/13/2021, 11:39 AM

## 2021-02-18 ENCOUNTER — Inpatient Hospital Stay: Payer: Medicare Other

## 2021-02-18 ENCOUNTER — Inpatient Hospital Stay: Payer: Medicare Other | Attending: Family

## 2021-02-18 ENCOUNTER — Inpatient Hospital Stay (HOSPITAL_BASED_OUTPATIENT_CLINIC_OR_DEPARTMENT_OTHER): Payer: Medicare Other | Admitting: Family

## 2021-02-18 ENCOUNTER — Encounter: Payer: Self-pay | Admitting: Family

## 2021-02-18 ENCOUNTER — Other Ambulatory Visit: Payer: Self-pay

## 2021-02-18 VITALS — BP 103/72 | HR 73 | Temp 98.5°F | Resp 19 | Ht 66.0 in | Wt 243.0 lb

## 2021-02-18 DIAGNOSIS — D510 Vitamin B12 deficiency anemia due to intrinsic factor deficiency: Secondary | ICD-10-CM

## 2021-02-18 DIAGNOSIS — D509 Iron deficiency anemia, unspecified: Secondary | ICD-10-CM

## 2021-02-18 DIAGNOSIS — D631 Anemia in chronic kidney disease: Secondary | ICD-10-CM | POA: Diagnosis not present

## 2021-02-18 DIAGNOSIS — D500 Iron deficiency anemia secondary to blood loss (chronic): Secondary | ICD-10-CM

## 2021-02-18 LAB — CBC WITH DIFFERENTIAL (CANCER CENTER ONLY)
Abs Immature Granulocytes: 0.07 10*3/uL (ref 0.00–0.07)
Basophils Absolute: 0 10*3/uL (ref 0.0–0.1)
Basophils Relative: 1 %
Eosinophils Absolute: 0.2 10*3/uL (ref 0.0–0.5)
Eosinophils Relative: 3 %
HCT: 39.7 % (ref 39.0–52.0)
Hemoglobin: 12.3 g/dL — ABNORMAL LOW (ref 13.0–17.0)
Immature Granulocytes: 1 %
Lymphocytes Relative: 26 %
Lymphs Abs: 1.8 10*3/uL (ref 0.7–4.0)
MCH: 28.6 pg (ref 26.0–34.0)
MCHC: 31 g/dL (ref 30.0–36.0)
MCV: 92.3 fL (ref 80.0–100.0)
Monocytes Absolute: 0.4 10*3/uL (ref 0.1–1.0)
Monocytes Relative: 5 %
Neutro Abs: 4.5 10*3/uL (ref 1.7–7.7)
Neutrophils Relative %: 64 %
Platelet Count: 269 10*3/uL (ref 150–400)
RBC: 4.3 MIL/uL (ref 4.22–5.81)
RDW: 14.8 % (ref 11.5–15.5)
WBC Count: 7 10*3/uL (ref 4.0–10.5)
nRBC: 0 % (ref 0.0–0.2)

## 2021-02-18 LAB — CMP (CANCER CENTER ONLY)
ALT: 24 U/L (ref 0–44)
AST: 17 U/L (ref 15–41)
Albumin: 4 g/dL (ref 3.5–5.0)
Alkaline Phosphatase: 103 U/L (ref 38–126)
Anion gap: 6 (ref 5–15)
BUN: 10 mg/dL (ref 8–23)
CO2: 29 mmol/L (ref 22–32)
Calcium: 10.3 mg/dL (ref 8.9–10.3)
Chloride: 103 mmol/L (ref 98–111)
Creatinine: 1.32 mg/dL — ABNORMAL HIGH (ref 0.61–1.24)
GFR, Estimated: 60 mL/min — ABNORMAL LOW (ref >60)
Glucose, Bld: 138 mg/dL — ABNORMAL HIGH (ref 70–99)
Potassium: 4 mmol/L (ref 3.5–5.1)
Sodium: 138 mmol/L (ref 135–145)
Total Bilirubin: 0.3 mg/dL (ref 0.3–1.2)
Total Protein: 7.3 g/dL (ref 6.5–8.1)

## 2021-02-18 LAB — IRON AND TIBC
Iron: 43 ug/dL (ref 42–163)
Saturation Ratios: 14 % — ABNORMAL LOW (ref 20–55)
TIBC: 313 ug/dL (ref 202–409)
UIBC: 269 ug/dL (ref 117–376)

## 2021-02-18 LAB — RETICULOCYTES
Immature Retic Fract: 21.1 % — ABNORMAL HIGH (ref 2.3–15.9)
RBC.: 4.27 MIL/uL (ref 4.22–5.81)
Retic Count, Absolute: 99.5 10*3/uL (ref 19.0–186.0)
Retic Ct Pct: 2.3 % (ref 0.4–3.1)

## 2021-02-18 LAB — FERRITIN: Ferritin: 144 ng/mL (ref 24–336)

## 2021-02-18 LAB — VITAMIN B12: Vitamin B-12: 533 pg/mL (ref 180–914)

## 2021-02-18 MED ORDER — CYANOCOBALAMIN 1000 MCG/ML IJ SOLN
1000.0000 ug | Freq: Once | INTRAMUSCULAR | Status: AC
  Administered 2021-02-18: 1000 ug via INTRAMUSCULAR

## 2021-02-18 MED ORDER — CYANOCOBALAMIN 1000 MCG/ML IJ SOLN
INTRAMUSCULAR | Status: AC
  Filled 2021-02-18: qty 1

## 2021-02-18 NOTE — Progress Notes (Signed)
Hematology and Oncology Follow Up Visit  James Zuniga [id number] [birth year] 65 y.o. 02/18/2021   Principle Diagnosis:  Iron deficiency anemia Hypotestosteronemia Chronic liver lesions Pernicious anemia   Of Note: History of stroke in June 2018, no more Testosterone or ESA's   Current Therapy:        IV iron as indicated Vit B12 1 mg IM q month     Interim History:  James Zuniga is here today for follow-up and B 12 injection. He is doing well but notes consistent fatigue and falling a asleep if he sits down for long.  He has not noted any blood loss. No bruising or petechiae.  Hgb is 12.3, MCV 92, platelets 269 and WBC count 7.0.  He has mild SOB and palpitations with over exertion. He will take a break to rest as needed.  He states that he will have a PFT with pulmonology in 3 weeks and follow-up with his cardiologist next week.  No fever, chills, n/v, cough, rash, dizziness, chest pain, abdominal pain or changes in bowel or bladder habits.  Minimal swelling in his ankles. Pedal pulses are 2+.  No tenderness in his extremities.  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  No falls or syncope.  He has maintained a good appetite and is staying well hydrated. His weight is stable at 243 lbs.   ECOG Performance Status: 1 - Symptomatic but completely ambulatory  Medications:  Allergies as of 02/18/2021   No Known Allergies      Medication List        Accurate as of February 18, 2021  8:35 AM. If you have any questions, ask your nurse or doctor.          albuterol 108 (90 Base) MCG/ACT inhaler Commonly known as: VENTOLIN HFA Inhale 2 puffs into the lungs every 6 (six) hours as needed for wheezing or shortness of breath.   allopurinol 300 MG tablet Commonly known as: ZYLOPRIM Take 300 mg by mouth daily.   amLODipine 5 MG tablet Commonly known as: NORVASC Take 5 mg by mouth daily.   Anoro Ellipta 62.5-25 MCG/INH Aepb Generic drug:  umeclidinium-vilanterol Inhale 1 puff into the lungs daily.   atorvastatin 80 MG tablet Commonly known as: LIPITOR Take 1 tablet (80 mg total) by mouth every morning.   clopidogrel 75 MG tablet Commonly known as: PLAVIX Take 1 tablet (75 mg total) by mouth daily.   diclofenac sodium 1 % Gel Commonly known as: VOLTAREN Use 4 grams to knees tid prn   DULoxetine 60 MG capsule Commonly known as: CYMBALTA Take 60 mg by mouth 2 (two) times daily.   Fifty50 Glucose Meter 2.0 w/Device Kit To check blood sugars TID or Use as instructed Include strips. Lancets, lancet device, control solution. batteries   Fifty50 Pen Needles 32G X 4 MM Misc Generic drug: Insulin Pen Needle 1 each by Misc.(Non-Drug; Combo Route) route daily.   furosemide 20 MG tablet Commonly known as: LASIX Take 20 mg by mouth every Tuesday, Thursday, and Saturday at 6 PM.   gabapentin 300 MG capsule Commonly known as: NEURONTIN Take 300 mg by mouth 4 (four) times daily.   glucose blood test strip 1 each by Misc.(Non-Drug; Combo Route) route daily.   insulin NPH-regular Human (70-30) 100 UNIT/ML injection Commonly known as: NovoLIN 70/30 Please inject 25 units twice daily before breakfast and dinner. What changed:  how much to take how to take this when to take this additional  instructions   insulin regular 100 units/mL injection Commonly known as: NOVOLIN R Use as sliding scale up to 10 units tid with meals   ipratropium-albuterol 0.5-2.5 (3) MG/3ML Soln Commonly known as: DUONEB USE 1 VIAL IN NEBULIZER 4 TIMES DAILY   losartan 100 MG tablet Commonly known as: COZAAR Take 100 mg by mouth daily.   metoprolol 200 MG 24 hr tablet Commonly known as: TOPROL-XL Take 200 mg by mouth daily.   onetouch ultrasoft lancets USE 1 LANCET TO CHECK GLUCOSE IN THE MORNING BEFORE BREAKFAST   pantoprazole 20 MG tablet Commonly known as: PROTONIX Take 20 mg by mouth daily.   potassium chloride SA 20 MEQ  tablet Commonly known as: KLOR-CON Take 1 tablet (20 mEq total) by mouth 2 (two) times daily. What changed:  when to take this additional instructions   primidone 50 MG tablet Commonly known as: MYSOLINE Take 100 mg by mouth at bedtime.   spironolactone 25 MG tablet Commonly known as: ALDACTONE Take 25 mg by mouth daily.   tamsulosin 0.4 MG Caps capsule Commonly known as: FLOMAX Take 1 capsule (0.4 mg total) by mouth daily after breakfast.   tiZANidine 4 MG tablet Commonly known as: ZANAFLEX Take 4 mg by mouth 3 (three) times daily as needed for muscle spasms.   traMADol 50 MG tablet Commonly known as: ULTRAM Take 50 mg by mouth daily as needed for moderate pain.   Tyler Aas FlexTouch 200 UNIT/ML FlexTouch Pen Generic drug: insulin degludec 63 Units. 60 units once daily per patient.   trimethoprim-polymyxin b ophthalmic solution Commonly known as: POLYTRIM INSTILL 1 DROP INTO LEFT EYE 4 TIMES DAILY FOR 5 DAYS   VITAMIN B-12 IJ Inject 1,000 mg as directed every 30 (thirty) days. X 4        Allergies: No Known Allergies  Past Medical History, Surgical history, Social history, and Family History were reviewed and updated.  Review of Systems: All other 10 point review of systems is negative.   Physical Exam:  vitals were not taken for this visit.   Wt Readings from Last 3 Encounters:  02/13/21 245 lb (111.1 kg)  11/29/20 240 lb 1.9 oz (108.9 kg)  09/24/20 237 lb 12.8 oz (107.9 kg)    Ocular: Sclerae unicteric, pupils equal, round and reactive to light Ear-nose-throat: Oropharynx clear, dentition fair Lymphatic: No cervical or supraclavicular adenopathy Lungs no rales or rhonchi, good excursion bilaterally Heart regular rate and rhythm, no murmur appreciated Abd soft, nontender, positive bowel sounds MSK no focal spinal tenderness, no joint edema Neuro: non-focal, well-oriented, appropriate affect Breasts: Deferred   Lab Results  Component Value Date    WBC 7.0 02/18/2021   HGB 12.3 (L) 02/18/2021   HCT 39.7 02/18/2021   MCV 92.3 02/18/2021   PLT 269 02/18/2021   Lab Results  Component Value Date   FERRITIN 340 (H) 11/29/2020   IRON 60 11/29/2020   TIBC 321 11/29/2020   UIBC 261 11/29/2020   IRONPCTSAT 19 (L) 11/29/2020   Lab Results  Component Value Date   RETICCTPCT 2.3 02/18/2021   RBC 4.27 02/18/2021   RBC 4.30 02/18/2021   RETICCTABS 63.0 06/20/2015   No results found for: KPAFRELGTCHN, LAMBDASER, KAPLAMBRATIO Lab Results  Component Value Date   IGA 139 08/26/2017   No results found for: Odetta Pink, SPEI   Chemistry      Component Value Date/Time   NA 135 11/29/2020 1248   NA 141 09/25/2016 0809  K 4.3 11/29/2020 1248   K 2.8 (LL) 09/25/2016 0809   CL 100 11/29/2020 1248   CL 102 07/31/2016 0815   CO2 27 11/29/2020 1248   CO2 28 09/25/2016 0809   BUN 14 11/29/2020 1248   BUN 13.1 09/25/2016 0809   CREATININE 1.38 (H) 11/29/2020 1248   CREATININE 1.2 09/25/2016 0809      Component Value Date/Time   CALCIUM 11.0 (H) 11/29/2020 1248   CALCIUM 9.5 09/25/2016 0809   ALKPHOS 102 11/29/2020 1248   ALKPHOS 126 09/25/2016 0809   AST 21 11/29/2020 1248   AST 19 09/25/2016 0809   ALT 31 11/29/2020 1248   ALT 23 09/25/2016 0809   BILITOT 0.3 11/29/2020 1248   BILITOT 0.45 09/25/2016 0809       Impression and Plan: Mr. Deyoung is a very pleasant 65 yo African American gentleman with multifactorial anemia including chronic iron deficiency.  He received his B12 injection.  Iron studies are pending. We will replace if needed.  B 12 monthly and follow-up in 3 months.  He can contact our office with any questions or concerns.    Laverna Peace, NP 8/9/20228:35 AM

## 2021-02-18 NOTE — Patient Instructions (Signed)
Vitamin B12 Injection What is this medication? Vitamin B12 (VAHY tuh min B12) prevents and treats low vitamin B12 levels in your body. It is used in people who do not get enough vitamin B12 from their diet or when their digestive tract does not absorb enough. Vitamin B12 plays an important role in maintaining the health of your nervous system and red bloodcells. This medicine may be used for other purposes; ask your health care provider orpharmacist if you have questions. COMMON BRAND NAME(S): B-12 Compliance Kit, B-12 Injection Kit, Cyomin, LA-12,Nutri-Twelve, Physicians EZ Use B-12, Primabalt What should I tell my care team before I take this medication? They need to know if you have any of these conditions: Kidney disease Leber's disease Megaloblastic anemia An unusual or allergic reaction to cyanocobalamin, cobalt, other medications, foods, dyes, or preservatives Pregnant or trying to get pregnant Breast-feeding How should I use this medication? This medication is injected into a muscle or deeply under the skin. It is usually given in a clinic or care team's office. However, your care team mayteach you how to inject yourself. Follow all instructions. Talk to your care team about the use of this medication in children. Specialcare may be needed. Overdosage: If you think you have taken too much of this medicine contact apoison control center or emergency room at once. NOTE: This medicine is only for you. Do not share this medicine with others. What if I miss a dose? If you are given your dose at a clinic or care team's office, call to reschedule your appointment. If you give your own injections, and you miss a dose, take it as soon as you can. If it is almost time for your next dose, takeonly that dose. Do not take double or extra doses. What may interact with this medication? Colchicine Heavy alcohol intake This list may not describe all possible interactions. Give your health care provider  a list of all the medicines, herbs, non-prescription drugs, or dietary supplements you use. Also tell them if you smoke, drink alcohol, or use illegaldrugs. Some items may interact with your medicine. What should I watch for while using this medication? Visit your care team regularly. You may need blood work done while you aretaking this medication. You may need to follow a special diet. Talk to your care team. Limit youralcohol intake and avoid smoking to get the best benefit. What side effects may I notice from receiving this medication? Side effects that you should report to your care team as soon as possible: Allergic reactions-skin rash, itching, hives, swelling of the face, lips, tongue, or throat Swelling of the ankles, hands, or feet Trouble breathing Side effects that usually do not require medical attention (report to your careteam if they continue or are bothersome): Diarrhea This list may not describe all possible side effects. Call your doctor for medical advice about side effects. You may report side effects to FDA at1-800-FDA-1088. Where should I keep my medication? Keep out of the reach of children. Store at room temperature between 15 and 30 degrees C (59 and 85 degrees F).Protect from light. Throw away any unused medication after the expiration date. NOTE: This sheet is a summary. It may not cover all possible information. If you have questions about this medicine, talk to your doctor, pharmacist, orhealth care provider.  2022 Elsevier/Gold Standard (2020-08-19 11:47:06)  

## 2021-02-19 ENCOUNTER — Telehealth: Payer: Self-pay | Admitting: Hematology & Oncology

## 2021-02-19 NOTE — Telephone Encounter (Signed)
Called and left detailed message for patient regarding appointment added per 8/9 sch msg

## 2021-02-24 ENCOUNTER — Inpatient Hospital Stay: Payer: Medicare Other

## 2021-02-24 ENCOUNTER — Other Ambulatory Visit: Payer: Self-pay

## 2021-02-24 VITALS — BP 98/63 | HR 71 | Temp 98.4°F | Resp 18

## 2021-02-24 DIAGNOSIS — D509 Iron deficiency anemia, unspecified: Secondary | ICD-10-CM

## 2021-02-24 MED ORDER — SODIUM CHLORIDE 0.9 % IV SOLN: Freq: Once | INTRAVENOUS | Status: AC

## 2021-02-24 MED ORDER — SODIUM CHLORIDE 0.9 % IV SOLN
125.0000 mg | Freq: Once | INTRAVENOUS | Status: AC
  Administered 2021-02-24: 125 mg via INTRAVENOUS
  Filled 2021-02-24: qty 125

## 2021-02-24 NOTE — Patient Instructions (Signed)
Sodium Ferric Gluconate Complex injection What is this medication? SODIUM FERRIC GLUCONATE COMPLEX (SOE dee um FER ik GLOO koe nate KOM pleks) is an iron replacement. It is used with epoetin therapy to treat low iron levelsin patients who are receiving hemodialysis. This medicine may be used for other purposes; ask your health care provider orpharmacist if you have questions. COMMON BRAND NAME(S): Ferrlecit, Nulecit What should I tell my care team before I take this medication? They need to know if you have any of the following conditions: anemia that is not from iron deficiency high levels of iron in the body an unusual or allergic reaction to iron, benzyl alcohol, other medicines, foods, dyes, or preservatives pregnant or are trying to become pregnant breast-feeding How should I use this medication? This medicine is for infusion into a vein. It is given by a health careprofessional in a hospital or clinic setting. Talk to your pediatrician regarding the use of this medicine in children. While this drug may be prescribed for children as young as 6 years old for selectedconditions, precautions do apply. Overdosage: If you think you have taken too much of this medicine contact apoison control center or emergency room at once. NOTE: This medicine is only for you. Do not share this medicine with others. What if I miss a dose? It is important not to miss your dose. Call your doctor or health careprofessional if you are unable to keep an appointment. What may interact with this medication? Do not take this medicine with any of the following medications: deferoxamine dimercaprol other iron products This medicine may also interact with the following medications: chloramphenicol deferasirox medicine for blood pressure like enalapril This list may not describe all possible interactions. Give your health care provider a list of all the medicines, herbs, non-prescription drugs, or dietary  supplements you use. Also tell them if you smoke, drink alcohol, or use illegaldrugs. Some items may interact with your medicine. What should I watch for while using this medication? Your condition will be monitored carefully while you are receiving thismedicine. Visit your doctor for check-ups as directed. What side effects may I notice from receiving this medication? Side effects that you should report to your doctor or health care professionalas soon as possible: allergic reactions like skin rash, itching or hives, swelling of the face, lips, or tongue breathing problems changes in hearing changes in vision chills, flushing, or sweating fast, irregular heartbeat feeling faint or lightheaded, falls fever, flu-like symptoms high or low blood pressure pain, tingling, numbness in the hands or feet severe pain in the chest, back, flanks, or groin swelling of the ankles, feet, hands trouble passing urine or change in the amount of urine unusually weak or tired Side effects that usually do not require medical attention (report to yourdoctor or health care professional if they continue or are bothersome): cramps dark colored stools diarrhea headache nausea, vomiting stomach upset This list may not describe all possible side effects. Call your doctor for medical advice about side effects. You may report side effects to FDA at1-800-FDA-1088. Where should I keep my medication? This drug is given in a hospital or clinic and will not be stored at home. NOTE: This sheet is a summary. It may not cover all possible information. If you have questions about this medicine, talk to your doctor, pharmacist, orhealth care provider.  2022 Elsevier/Gold Standard (2008-02-29 15:58:57)  

## 2021-03-20 ENCOUNTER — Other Ambulatory Visit: Payer: Self-pay

## 2021-03-20 ENCOUNTER — Inpatient Hospital Stay: Payer: Medicare Other | Attending: Family

## 2021-03-20 VITALS — BP 116/73 | HR 66 | Temp 98.0°F | Resp 17 | Wt 239.0 lb

## 2021-03-20 DIAGNOSIS — D510 Vitamin B12 deficiency anemia due to intrinsic factor deficiency: Secondary | ICD-10-CM | POA: Diagnosis not present

## 2021-03-20 DIAGNOSIS — D509 Iron deficiency anemia, unspecified: Secondary | ICD-10-CM

## 2021-03-20 MED ORDER — CYANOCOBALAMIN 1000 MCG/ML IJ SOLN
1000.0000 ug | Freq: Once | INTRAMUSCULAR | Status: AC
  Administered 2021-03-20: 1000 ug via INTRAMUSCULAR
  Filled 2021-03-20: qty 1

## 2021-03-20 NOTE — Patient Instructions (Signed)
Vitamin B12 Deficiency Vitamin B12 deficiency means that your body does not have enough vitamin B12. The body needs this vitamin: To make red blood cells. To make genes (DNA). To help the nerves work. If you do not have enough vitamin B12 in your body, you can have health problems. What are the causes? Not eating enough foods that contain vitamin B12. Not being able to absorb vitamin B12 from the food that you eat. Certain digestive system diseases. A condition in which the body does not make enough of a certain protein, which results in too few red blood cells (pernicious anemia). Having a surgery in which part of the stomach or small intestine is removed. Taking medicines that make it hard for the body to absorb vitamin B12. These medicines include: Heartburn medicines. Some antibiotic medicines. Other medicines that are used to treat certain conditions. What increases the risk? Being older than age 50. Eating a vegetarian or vegan diet, especially while you are pregnant. Eating a poor diet while you are pregnant. Taking certain medicines. Having alcoholism. What are the signs or symptoms? In some cases, there are no symptoms. If the condition leads to too few blood cells or nerve damage, symptoms can occur, such as: Feeling weak. Feeling tired (fatigued). Not being hungry. Weight loss. A loss of feeling (numbness) or tingling in your hands and feet. Redness and burning of the tongue. Being mixed up (confused) or having memory problems. Sadness (depression). Problems with your senses. This can include color blindness, ringing in the ears, or loss of taste. Watery poop (diarrhea) or trouble pooping (constipation). Trouble walking. If anemia is very bad, symptoms can include: Being short of breath. Being dizzy. Having a very fast heartbeat. How is this treated? Changing the way you eat and drink, such as: Eating more foods that contain vitamin B12. Drinking little or no  alcohol. Getting vitamin B12 shots. Taking vitamin B12 supplements. Your doctor will tell you the dose that is best for you. Follow these instructions at home: Eating and drinking  Eat lots of healthy foods that contain vitamin B12. These include: Meats and poultry, such as beef, pork, chicken, turkey, and organ meats, such as liver. Seafood, such as clams, rainbow trout, salmon, tuna, and haddock. Eggs. Cereal and dairy products that have vitamin B12 added to them. Check the label. The items listed above may not be a complete list of what you can eat and drink. Contact a dietitian for more options. General instructions Get any shots as told by your doctor. Take supplements only as told by your doctor. Do not drink alcohol if your doctor tells you not to. In some cases, you may only be asked to limit alcohol use. Keep all follow-up visits as told by your doctor. This is important. Contact a doctor if: Your symptoms come back. Get help right away if: You have trouble breathing. You have a very fast heartbeat. You have chest pain. You get dizzy. You pass out. Summary Vitamin B12 deficiency means that your body is not getting enough vitamin B12. In some cases, there are no symptoms of this condition. Treatment may include making a change in the way you eat and drink, getting vitamin B12 shots, or taking supplements. Eat lots of healthy foods that contain vitamin B12. This information is not intended to replace advice given to you by your health care provider. Make sure you discuss any questions you have with your health care provider. Document Revised: 03/08/2018 Document Reviewed: 03/08/2018 Elsevier Patient Education    2022 [employer]  

## 2021-03-21 ENCOUNTER — Ambulatory Visit: Payer: Medicare Other

## 2021-04-02 ENCOUNTER — Ambulatory Visit (INDEPENDENT_AMBULATORY_CARE_PROVIDER_SITE_OTHER): Payer: Medicare Other | Admitting: Pulmonary Disease

## 2021-04-02 ENCOUNTER — Other Ambulatory Visit: Payer: Self-pay

## 2021-04-02 ENCOUNTER — Encounter: Payer: Self-pay | Admitting: Pulmonary Disease

## 2021-04-02 VITALS — BP 130/70 | HR 87 | Temp 97.5°F | Ht 66.0 in | Wt 236.0 lb

## 2021-04-02 DIAGNOSIS — R0609 Other forms of dyspnea: Secondary | ICD-10-CM

## 2021-04-02 DIAGNOSIS — J449 Chronic obstructive pulmonary disease, unspecified: Secondary | ICD-10-CM

## 2021-04-02 DIAGNOSIS — R0600 Dyspnea, unspecified: Secondary | ICD-10-CM | POA: Diagnosis not present

## 2021-04-02 DIAGNOSIS — Z23 Encounter for immunization: Secondary | ICD-10-CM | POA: Diagnosis not present

## 2021-04-02 DIAGNOSIS — J438 Other emphysema: Secondary | ICD-10-CM

## 2021-04-02 DIAGNOSIS — G4733 Obstructive sleep apnea (adult) (pediatric): Secondary | ICD-10-CM

## 2021-04-02 LAB — PULMONARY FUNCTION TEST
DL/VA % pred: 107 %
DL/VA: 4.51 ml/min/mmHg/L
DLCO cor % pred: 76 %
DLCO cor: 19.15 ml/min/mmHg
DLCO unc % pred: 70 %
DLCO unc: 17.78 ml/min/mmHg
FEF 25-75 Post: 1.27 L/sec
FEF 25-75 Pre: 1.62 L/sec
FEF2575-%Change-Post: -21 %
FEF2575-%Pred-Post: 50 %
FEF2575-%Pred-Pre: 64 %
FEV1-%Change-Post: -9 %
FEV1-%Pred-Post: 71 %
FEV1-%Pred-Pre: 78 %
FEV1-Post: 1.98 L
FEV1-Pre: 2.19 L
FEV1FVC-%Change-Post: -4 %
FEV1FVC-%Pred-Pre: 99 %
FEV6-%Change-Post: -5 %
FEV6-%Pred-Post: 77 %
FEV6-%Pred-Pre: 82 %
FEV6-Post: 2.7 L
FEV6-Pre: 2.85 L
FEV6FVC-%Pred-Post: 104 %
FEV6FVC-%Pred-Pre: 104 %
FVC-%Change-Post: -5 %
FVC-%Pred-Post: 74 %
FVC-%Pred-Pre: 78 %
FVC-Post: 2.7 L
FVC-Pre: 2.85 L
Post FEV1/FVC ratio: 73 %
Post FEV6/FVC ratio: 100 %
Pre FEV1/FVC ratio: 77 %
Pre FEV6/FVC Ratio: 100 %
RV % pred: 101 %
RV: 2.27 L
TLC % pred: 80 %
TLC: 5.34 L

## 2021-04-02 MED ORDER — ANORO ELLIPTA 62.5-25 MCG/INH IN AEPB: 1.0000 | INHALATION_SPRAY | Freq: Every day | RESPIRATORY_TRACT | 5 refills | Status: DC

## 2021-04-02 NOTE — Patient Instructions (Signed)
Full PFT performed today. °

## 2021-04-02 NOTE — Progress Notes (Signed)
[facility]  Chief Complaint  Patient presents with   Follow-up    Had PFT-sob with exertion, cough-white, wheezing    Constitutional:  BP 130/70 (BP Location: Right Arm, Cuff Size: Normal)   Pulse 87   Temp (!) 97.5 F (36.4 C) (Temporal)   Ht 5' 6" (1.676 m)   Wt 236 lb (107 kg)   SpO2 94%   BMI 38.09 kg/m   Past Medical History:  CVA, ETOH, OA, DM, GERD, CHF, HLD, Gout, HTN, Low T, Vit D deficiency, Iron deficiency anemia  Past Surgical History:  He  has a past surgical history that includes Knee arthroscopy (Left); TEE without cardioversion (N/A, 12/24/2016); Loop recorder implant (06/2017); Esophagogastroduodenoscopy (N/A, 08/26/2017); Colonoscopy (N/A, 08/26/2017); enteroscopy (N/A, 05/24/2018); Hot hemostasis (N/A, 05/24/2018); and polypectomy (05/24/2018).  Brief Summary:  James Zuniga is a 65 y.o. male former smoker with obstructive sleep apnea and emphysema.      Subjective:   His PFT today mild obstruction and diffusion defect.  He ran out of anoro.  His chest xray from 02/14/21 showed changes consistent with chronic heart failure.  He had Echo done with cardiology.  He was told he needed to get loop recorder generator change.  He uses CPAP most nights.  Feels pressure is okay.  He maintained SpO2 > 92% while walking on room air today.  Physical Exam:   Appearance - well kempt, uses a cane  ENMT - no sinus tenderness, no oral exudate, no LAN, Mallampati 3 airway, no stridor  Respiratory - equal breath sounds bilaterally, no wheezing or rales  CV - s1s2 regular rate and rhythm, no murmurs  Ext - no clubbing, no edema  Skin - no rashes  Psych - normal mood and affect    Pulmonary testing:  PFT 01/27/13 >> FEV1 2.50 (84%), FEV1% 83, TLC 4.06 (61%), DLCO 60%, no BD PFT 04/02/21 >> FEV1 2.19 (78%), FEV1% 77, TLC 5.34 (80%), DLCO 76%  Chest Imaging:  CT chest 03/02/13 >> mild paraseptal emphysema, 4 mm RUL  nodule CT chest 07/26/15 >> stable RUL nodule  Sleep Tests:  PSG 04/21/19 >> AHI 15.5, SpO2 low 83% CPAP titration 05/17/19 >> CPAP 11 cm H2O CPAP 03/02/21 to 03/31/21 >> used on 26 of 30 nights with average 5 hrs 16 min.  Average AHI 2.4 with CPAP 11 cm H2O  Cardiac Tests:  Echo 03/03/21 >> EF 45 to 50%, mild LVH  Social History:  He  reports that he quit smoking about 3 years ago. His smoking use included cigarettes. He started smoking about 37 years ago. He has a 20.00 pack-year smoking history. He has never used smokeless tobacco. He reports that he does not drink alcohol and does not use drugs.  Family History:  His family history includes Diabetes in his father and sister; Heart attack in his brother and brother; Heart disease in his mother; Stroke in his brother and paternal uncle.     Assessment/Plan:   Dyspnea on exertion. - COPD with emphysema seems stable w/o significant progression on PFT or imaging study - likely related to deconditioning and CHF - discussed weight loss and regular exercise  Obstructive sleep apnea. - he is compliant with CPAP and reports benefit - he uses [employer] for his DME - continue CPAP 11 cm H2O - he will check with his DME to get replacement mask part and call if he needs an order for this   COPD with emphysema. 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 - followed by Dr. Folk with cardiology at [facility]    Time Spent Involved in Patient Care on Day of Examination:  34 minutes  Follow up:   Patient Instructions  High dose flu shot today  Call if you need an order to get replacement CPAP mask part from your home care company  Follow up in 6 months in [place] office  Medication List:   Allergies as of 04/02/2021   No Known Allergies      Medication List        Accurate as of April 02, 2021  1:43 PM. If you have any  questions, ask your nurse or doctor.          albuterol 108 (90 Base) MCG/ACT inhaler Commonly known as: VENTOLIN HFA Inhale 2 puffs into the lungs every 6 (six) hours as needed for wheezing or shortness of breath.   allopurinol 300 MG tablet Commonly known as: ZYLOPRIM Take 300 mg by mouth daily.   amLODipine 5 MG tablet Commonly known as: NORVASC Take 5 mg by mouth daily.   Anoro Ellipta 62.5-25 MCG/INH Aepb Generic drug: umeclidinium-vilanterol Inhale 1 puff into the lungs daily.   atorvastatin 80 MG tablet Commonly known as: LIPITOR Take 1 tablet (80 mg total) by mouth every morning.   clopidogrel 75 MG tablet Commonly known as: PLAVIX Take 1 tablet (75 mg total) by mouth daily.   diclofenac sodium 1 % Gel Commonly known as: VOLTAREN Use 4 grams to knees tid prn   DULoxetine 60 MG capsule Commonly known as: CYMBALTA Take 60 mg by mouth 2 (two) times daily.   Fifty50 Glucose Meter 2.0 w/Device Kit To check blood sugars TID or Use as instructed Include strips. Lancets, lancet device, control solution. batteries   Fifty50 Pen Needles 32G X 4 MM Misc Generic drug: Insulin Pen Needle 1 each by Misc.(Non-Drug; Combo Route) route daily.   furosemide 20 MG tablet Commonly known as: LASIX Take 20 mg by mouth every Tuesday, Thursday, and Saturday at 6 PM.   gabapentin 300 MG capsule Commonly known as: NEURONTIN Take 300 mg by mouth 4 (four) times daily.   glucose blood test strip 1 each by Misc.(Non-Drug; Combo Route) route daily.   insulin NPH-regular Human (70-30) 100 UNIT/ML injection Commonly known as: NovoLIN 70/30 Please inject 25 units twice daily before breakfast and dinner. What changed:  how much to take how to take this when to take this additional instructions   insulin regular 100 units/mL injection Commonly known as: NOVOLIN R Use as sliding scale up to 10 units tid with meals   ipratropium-albuterol 0.5-2.5 (3) MG/3ML Soln Commonly known  as: DUONEB USE 1 VIAL IN NEBULIZER 4 TIMES DAILY   losartan 100 MG tablet Commonly known as: COZAAR Take 100 mg by mouth daily.   metoprolol 200 MG 24 hr tablet Commonly known as: TOPROL-XL Take 200 mg by mouth daily.   onetouch ultrasoft lancets USE 1 LANCET TO CHECK GLUCOSE IN THE MORNING BEFORE BREAKFAST   pantoprazole 20 MG tablet Commonly known as: PROTONIX Take 20 mg by mouth daily.   potassium chloride SA 20 MEQ tablet Commonly known as: KLOR-CON Take 1 tablet (20 mEq total) by mouth 2 (two) times daily. What changed:  when to take this additional instructions   primidone 50 MG tablet Commonly known as: MYSOLINE Take 100 mg   by mouth at bedtime.   spironolactone 25 MG tablet Commonly known as: ALDACTONE Take 25 mg by mouth daily.   tamsulosin 0.4 MG Caps capsule Commonly known as: FLOMAX Take 1 capsule (0.4 mg total) by mouth daily after breakfast.   tiZANidine 4 MG tablet Commonly known as: ZANAFLEX Take 4 mg by mouth 3 (three) times daily as needed for muscle spasms.   traMADol 50 MG tablet Commonly known as: ULTRAM Take 50 mg by mouth daily as needed for moderate pain.   Tyler Aas FlexTouch 200 UNIT/ML FlexTouch Pen Generic drug: insulin degludec 63 Units. 60 units once daily per patient.   VITAMIN B-12 IJ Inject 1,000 mg as directed every 30 (thirty) days. X 4        Signature:  Chesley Mires, MD [place] Pager - [phone] 04/02/2021, 1:43 PM

## 2021-04-02 NOTE — Progress Notes (Signed)
Full PFT performed today. °

## 2021-04-02 NOTE — Patient Instructions (Signed)
High dose flu shot today  Call if you need an order to get replacement CPAP mask part from your home care company  Follow up in 6 months in [facility] office

## 2021-04-03 ENCOUNTER — Telehealth: Payer: Self-pay | Admitting: Pulmonary Disease

## 2021-04-03 MED ORDER — ANORO ELLIPTA 62.5-25 MCG/INH IN AEPB: 1.0000 | INHALATION_SPRAY | Freq: Every day | RESPIRATORY_TRACT | 5 refills | Status: DC

## 2021-04-03 NOTE — Telephone Encounter (Signed)
Anoro has been sent into [facility] in [place], [place]. Patient is aware.  Nothing further needed at this time.

## 2021-04-21 ENCOUNTER — Other Ambulatory Visit: Payer: Self-pay

## 2021-04-21 ENCOUNTER — Inpatient Hospital Stay: Payer: Medicare Other | Attending: Family

## 2021-04-21 ENCOUNTER — Telehealth: Payer: Self-pay | Admitting: *Deleted

## 2021-04-21 ENCOUNTER — Other Ambulatory Visit: Payer: Self-pay | Admitting: *Deleted

## 2021-04-21 VITALS — BP 103/70 | HR 51 | Temp 98.4°F | Resp 18

## 2021-04-21 DIAGNOSIS — D510 Vitamin B12 deficiency anemia due to intrinsic factor deficiency: Secondary | ICD-10-CM | POA: Diagnosis not present

## 2021-04-21 DIAGNOSIS — D509 Iron deficiency anemia, unspecified: Secondary | ICD-10-CM

## 2021-04-21 MED ORDER — CYANOCOBALAMIN 1000 MCG/ML IJ SOLN: 1000.0000 ug | Freq: Once | INTRAMUSCULAR | 6 refills | Status: AC

## 2021-04-21 MED ORDER — CYANOCOBALAMIN 1000 MCG/ML IJ SOLN
1000.0000 ug | Freq: Once | INTRAMUSCULAR | Status: AC
  Administered 2021-04-21: 1000 ug via INTRAMUSCULAR
  Filled 2021-04-21: qty 1

## 2021-04-21 NOTE — Telephone Encounter (Signed)
Received a call from Patients wife Murray Hodgkins who was concerned that her husband hasnt returned home from his injection appt this morning.  She has been in contact with him by phone but with this history of strokes she is concerned about him driving all the way from [facility] by himself to just receive an injection.  Patient inquired about PO Vitamin B12.  Dr Marin Olp does not think this would be optimum for efficacy.  Asked patient if she felt comfortable giving injections.  Says she gives Susan his insulin and gives herself injections.  Would like to try B12 injections at home.  Dr Marin Olp ok with this.  RX sent to patients pharmacy.  Tried to call patient wife back but unable to leave message.

## 2021-04-21 NOTE — Patient Instructions (Signed)
Vitamin B12 Deficiency Vitamin B12 deficiency means that your body does not have enough vitamin B12. The body needs this vitamin: To make red blood cells. To make genes (DNA). To help the nerves work. If you do not have enough vitamin B12 in your body, you can have health problems. What are the causes? Not eating enough foods that contain vitamin B12. Not being able to absorb vitamin B12 from the food that you eat. Certain digestive system diseases. A condition in which the body does not make enough of a certain protein, which results in too few red blood cells (pernicious anemia). Having a surgery in which part of the stomach or small intestine is removed. Taking medicines that make it hard for the body to absorb vitamin B12. These medicines include: Heartburn medicines. Some antibiotic medicines. Other medicines that are used to treat certain conditions. What increases the risk? Being older than age 50. Eating a vegetarian or vegan diet, especially while you are pregnant. Eating a poor diet while you are pregnant. Taking certain medicines. Having alcoholism. What are the signs or symptoms? In some cases, there are no symptoms. If the condition leads to too few blood cells or nerve damage, symptoms can occur, such as: Feeling weak. Feeling tired (fatigued). Not being hungry. Weight loss. A loss of feeling (numbness) or tingling in your hands and feet. Redness and burning of the tongue. Being mixed up (confused) or having memory problems. Sadness (depression). Problems with your senses. This can include color blindness, ringing in the ears, or loss of taste. Watery poop (diarrhea) or trouble pooping (constipation). Trouble walking. If anemia is very bad, symptoms can include: Being short of breath. Being dizzy. Having a very fast heartbeat. How is this treated? Changing the way you eat and drink, such as: Eating more foods that contain vitamin B12. Drinking little or no  alcohol. Getting vitamin B12 shots. Taking vitamin B12 supplements. Your doctor will tell you the dose that is best for you. Follow these instructions at home: Eating and drinking  Eat lots of healthy foods that contain vitamin B12. These include: Meats and poultry, such as beef, pork, chicken, turkey, and organ meats, such as liver. Seafood, such as clams, rainbow trout, salmon, tuna, and haddock. Eggs. Cereal and dairy products that have vitamin B12 added to them. Check the label. The items listed above may not be a complete list of what you can eat and drink. Contact a dietitian for more options. General instructions Get any shots as told by your doctor. Take supplements only as told by your doctor. Do not drink alcohol if your doctor tells you not to. In some cases, you may only be asked to limit alcohol use. Keep all follow-up visits as told by your doctor. This is important. Contact a doctor if: Your symptoms come back. Get help right away if: You have trouble breathing. You have a very fast heartbeat. You have chest pain. You get dizzy. You pass out. Summary Vitamin B12 deficiency means that your body is not getting enough vitamin B12. In some cases, there are no symptoms of this condition. Treatment may include making a change in the way you eat and drink, getting vitamin B12 shots, or taking supplements. Eat lots of healthy foods that contain vitamin B12. This information is not intended to replace advice given to you by your health care provider. Make sure you discuss any questions you have with your health care provider. Document Revised: 03/08/2018 Document Reviewed: 03/08/2018 Elsevier Patient Education    2022 [employer]  

## 2021-05-22 ENCOUNTER — Telehealth: Payer: Self-pay | Admitting: Pulmonary Disease

## 2021-05-22 MED ORDER — IPRATROPIUM-ALBUTEROL 0.5-2.5 (3) MG/3ML IN SOLN: RESPIRATORY_TRACT | 5 refills | Status: DC

## 2021-05-22 NOTE — Telephone Encounter (Signed)
I have called the pt and he is aware of the rx for the neb medcation has been sent to the pharmacy.  Nothing further is needed.

## 2021-05-23 ENCOUNTER — Inpatient Hospital Stay: Payer: Medicare Other

## 2021-05-23 ENCOUNTER — Other Ambulatory Visit: Payer: Self-pay

## 2021-05-23 ENCOUNTER — Encounter: Payer: Self-pay | Admitting: Family

## 2021-05-23 ENCOUNTER — Inpatient Hospital Stay: Payer: Medicare Other | Attending: Family

## 2021-05-23 ENCOUNTER — Inpatient Hospital Stay (HOSPITAL_BASED_OUTPATIENT_CLINIC_OR_DEPARTMENT_OTHER): Payer: Medicare Other | Admitting: Family

## 2021-05-23 VITALS — BP 109/67 | HR 75 | Temp 98.2°F | Resp 18 | Wt 240.0 lb

## 2021-05-23 DIAGNOSIS — D631 Anemia in chronic kidney disease: Secondary | ICD-10-CM

## 2021-05-23 DIAGNOSIS — D510 Vitamin B12 deficiency anemia due to intrinsic factor deficiency: Secondary | ICD-10-CM

## 2021-05-23 DIAGNOSIS — D509 Iron deficiency anemia, unspecified: Secondary | ICD-10-CM

## 2021-05-23 DIAGNOSIS — D500 Iron deficiency anemia secondary to blood loss (chronic): Secondary | ICD-10-CM

## 2021-05-23 LAB — IRON AND TIBC
Iron: 39 ug/dL — ABNORMAL LOW (ref 42–163)
Saturation Ratios: 14 % — ABNORMAL LOW (ref 20–55)
TIBC: 274 ug/dL (ref 202–409)
UIBC: 235 ug/dL (ref 117–376)

## 2021-05-23 LAB — CBC WITH DIFFERENTIAL (CANCER CENTER ONLY)
Abs Immature Granulocytes: 0.06 10*3/uL (ref 0.00–0.07)
Basophils Absolute: 0 10*3/uL (ref 0.0–0.1)
Basophils Relative: 1 %
Eosinophils Absolute: 0.2 10*3/uL (ref 0.0–0.5)
Eosinophils Relative: 3 %
HCT: 37.8 % — ABNORMAL LOW (ref 39.0–52.0)
Hemoglobin: 11.7 g/dL — ABNORMAL LOW (ref 13.0–17.0)
Immature Granulocytes: 1 %
Lymphocytes Relative: 25 %
Lymphs Abs: 1.5 10*3/uL (ref 0.7–4.0)
MCH: 28.1 pg (ref 26.0–34.0)
MCHC: 31 g/dL (ref 30.0–36.0)
MCV: 90.6 fL (ref 80.0–100.0)
Monocytes Absolute: 0.4 10*3/uL (ref 0.1–1.0)
Monocytes Relative: 6 %
Neutro Abs: 3.8 10*3/uL (ref 1.7–7.7)
Neutrophils Relative %: 64 %
Platelet Count: 281 10*3/uL (ref 150–400)
RBC: 4.17 MIL/uL — ABNORMAL LOW (ref 4.22–5.81)
RDW: 14.6 % (ref 11.5–15.5)
WBC Count: 5.9 10*3/uL (ref 4.0–10.5)
nRBC: 0 % (ref 0.0–0.2)

## 2021-05-23 LAB — RETICULOCYTES
Immature Retic Fract: 17.1 % — ABNORMAL HIGH (ref 2.3–15.9)
RBC.: 4.15 MIL/uL — ABNORMAL LOW (ref 4.22–5.81)
Retic Count, Absolute: 78 10*3/uL (ref 19.0–186.0)
Retic Ct Pct: 1.9 % (ref 0.4–3.1)

## 2021-05-23 LAB — CMP (CANCER CENTER ONLY)
ALT: 36 U/L (ref 0–44)
AST: 49 U/L — ABNORMAL HIGH (ref 15–41)
Albumin: 3.9 g/dL (ref 3.5–5.0)
Alkaline Phosphatase: 112 U/L (ref 38–126)
Anion gap: 7 (ref 5–15)
BUN: 14 mg/dL (ref 8–23)
CO2: 26 mmol/L (ref 22–32)
Calcium: 10.3 mg/dL (ref 8.9–10.3)
Chloride: 102 mmol/L (ref 98–111)
Creatinine: 1.41 mg/dL — ABNORMAL HIGH (ref 0.61–1.24)
GFR, Estimated: 55 mL/min — ABNORMAL LOW (ref >60)
Glucose, Bld: 228 mg/dL — ABNORMAL HIGH (ref 70–99)
Potassium: 4.3 mmol/L (ref 3.5–5.1)
Sodium: 135 mmol/L (ref 135–145)
Total Bilirubin: 0.3 mg/dL (ref 0.3–1.2)
Total Protein: 7.2 g/dL (ref 6.5–8.1)

## 2021-05-23 LAB — VITAMIN B12: Vitamin B-12: 629 pg/mL (ref 180–914)

## 2021-05-23 LAB — FERRITIN: Ferritin: 173 ng/mL (ref 24–336)

## 2021-05-23 MED ORDER — CYANOCOBALAMIN 1000 MCG/ML IJ SOLN
1000.0000 ug | Freq: Once | INTRAMUSCULAR | Status: AC
  Administered 2021-05-23: 1000 ug via INTRAMUSCULAR
  Filled 2021-05-23: qty 1

## 2021-05-23 NOTE — Progress Notes (Signed)
Hematology and Oncology Follow Up Visit  James Zuniga [id number] 03/13/1956 65 y.o. 05/23/2021   Principle Diagnosis:  Iron deficiency anemia Hypotestosteronemia Chronic liver lesions Pernicious anemia   Of Note: History of stroke in June 2018, no more Testosterone or ESA's   Current Therapy:        IV iron as indicated Vit B12 1 mg IM q month    Interim History:  James Zuniga is here today for follow-up and B 12 injection. He is doing well but has some fatigue at times.  He notes SOB with exertion with exacerbation of COPD. He states that he uses his nebulizer and inhalers as directed and takes a break to rest when needed.  No obvious blood loss noted. No bruising, no petechiae.  No fever, chills, n/v, cough, rash, dizziness, chest pain, palpitations, abdominal pain or changes in bowel or bladder habits.  He did have a fall several weeks ago when he got up one night to go to the bathroom. He states that his legs just gave out and he fell.  He does ambulate most of the time with a cane for added support.  No syncope to report.  The neuropathy in his hands and feet is unchanged from baseline.  The swelling in his lower extremities is stable. He takes his Lasix as prescribed which does help reduce his fluid retention.  He has been eating well and states that this blood sugars are fairly well controlled.  He admits that he needs to better hydrate throughout the day.  His weight is 240 lbs.   ECOG Performance Status: 1 - Symptomatic but completely ambulatory  Medications:  Allergies as of 05/23/2021   No Known Allergies      Medication List        Accurate as of May 23, 2021  9:01 AM. If you have any questions, ask your nurse or doctor.          albuterol 108 (90 Base) MCG/ACT inhaler Commonly known as: VENTOLIN HFA Inhale 2 puffs into the lungs every 6 (six) hours as needed for wheezing or shortness of breath.   allopurinol 300 MG tablet Commonly known as:  ZYLOPRIM Take 300 mg by mouth daily.   amLODipine 5 MG tablet Commonly known as: NORVASC Take 5 mg by mouth daily.   Anoro Ellipta 62.5-25 MCG/ACT Aepb Generic drug: umeclidinium-vilanterol Inhale 1 puff into the lungs daily.   atorvastatin 80 MG tablet Commonly known as: LIPITOR Take 1 tablet (80 mg total) by mouth every morning.   atorvastatin 80 MG tablet Commonly known as: LIPITOR Take 1 tablet by mouth daily.   clopidogrel 75 MG tablet Commonly known as: PLAVIX Take 1 tablet (75 mg total) by mouth daily.   colchicine 0.6 MG tablet Take 0.6 mg by mouth.   diclofenac sodium 1 % Gel Commonly known as: VOLTAREN Use 4 grams to knees tid prn   diclofenac Sodium 1 % Gel Commonly known as: VOLTAREN SMARTSIG:4 Gram(s) Topical 3 Times Daily PRN   DULoxetine 60 MG capsule Commonly known as: CYMBALTA Take 60 mg by mouth 2 (two) times daily.   Fifty50 Glucose Meter 2.0 w/Device Kit To check blood sugars TID or Use as instructed Include strips. Lancets, lancet device, control solution. batteries   Fifty50 Pen Needles 32G X 4 MM Misc Generic drug: Insulin Pen Needle 1 each by Misc.(Non-Drug; Combo Route) route daily.   furosemide 20 MG tablet Commonly known as: LASIX Take 20 mg by mouth every Tuesday,  Thursday, and Saturday at 6 PM.   gabapentin 300 MG capsule Commonly known as: NEURONTIN Take 300 mg by mouth 4 (four) times daily.   glucose blood test strip 1 each by Misc.(Non-Drug; Combo Route) route daily.   insulin NPH-regular Human (70-30) 100 UNIT/ML injection Commonly known as: NovoLIN 70/30 Please inject 25 units twice daily before breakfast and dinner. What changed:  how much to take how to take this when to take this additional instructions   insulin regular 100 units/mL injection Commonly known as: NOVOLIN R Use as sliding scale up to 10 units tid with meals   ipratropium-albuterol 0.5-2.5 (3) MG/3ML Soln Commonly known as: DUONEB USE 1 VIAL IN  NEBULIZER 4 TIMES DAILY   losartan 100 MG tablet Commonly known as: COZAAR Take 100 mg by mouth daily.   methocarbamol 500 MG tablet Commonly known as: ROBAXIN Take 500 mg by mouth 3 (three) times daily.   metoprolol 200 MG 24 hr tablet Commonly known as: TOPROL-XL Take 200 mg by mouth daily.   onetouch ultrasoft lancets USE 1 LANCET TO CHECK GLUCOSE IN THE MORNING BEFORE BREAKFAST   pantoprazole 20 MG tablet Commonly known as: PROTONIX Take 20 mg by mouth daily.   potassium chloride SA 20 MEQ tablet Commonly known as: KLOR-CON Take 1 tablet (20 mEq total) by mouth 2 (two) times daily. What changed:  when to take this additional instructions   primidone 50 MG tablet Commonly known as: MYSOLINE Take 100 mg by mouth at bedtime.   sildenafil 100 MG tablet Commonly known as: VIAGRA Take 1 tablet by mouth daily as needed.   spironolactone 25 MG tablet Commonly known as: ALDACTONE Take 25 mg by mouth daily.   tamsulosin 0.4 MG Caps capsule Commonly known as: FLOMAX Take 1 capsule (0.4 mg total) by mouth daily after breakfast.   tiZANidine 4 MG tablet Commonly known as: ZANAFLEX Take 4 mg by mouth 3 (three) times daily as needed for muscle spasms.   Toujeo SoloStar 300 UNIT/ML Solostar Pen Generic drug: insulin glargine (1 Unit Dial) Inject into the skin.   traMADol 50 MG tablet Commonly known as: ULTRAM Take 50 mg by mouth daily as needed for moderate pain.   Tyler Aas FlexTouch 200 UNIT/ML FlexTouch Pen Generic drug: insulin degludec 63 Units. 60 units once daily per patient.   VITAMIN B-12 IJ Inject 1,000 mg as directed every 30 (thirty) days. X 4   zolpidem 12.5 MG CR tablet Commonly known as: AMBIEN CR Take 12.5 mg by mouth at bedtime as needed.        Allergies: No Known Allergies  Past Medical History, Surgical history, Social history, and Family History were reviewed and updated.  Review of Systems: All other 10 point review of systems is  negative.   Physical Exam:  weight is 240 lb (108.9 kg). His oral temperature is 98.2 F (36.8 C). His blood pressure is 109/67 and his pulse is 75. His respiration is 18 and oxygen saturation is 95%.   Wt Readings from Last 3 Encounters:  05/23/21 240 lb (108.9 kg)  04/02/21 236 lb (107 kg)  03/20/21 239 lb (108.4 kg)    Ocular: Sclerae unicteric, pupils equal, round and reactive to light Ear-nose-throat: Oropharynx clear, dentition fair Lymphatic: No cervical or supraclavicular adenopathy Lungs no rales or rhonchi, good excursion bilaterally Heart regular rate and rhythm, no murmur appreciated Abd soft, nontender, positive bowel sounds MSK no focal spinal tenderness, no joint edema Neuro: non-focal, well-oriented, appropriate affect Breasts: Deferred  Lab Results  Component Value Date   WBC 5.9 05/23/2021   HGB 11.7 (L) 05/23/2021   HCT 37.8 (L) 05/23/2021   MCV 90.6 05/23/2021   PLT 281 05/23/2021   Lab Results  Component Value Date   FERRITIN 144 02/18/2021   IRON 43 02/18/2021   TIBC 313 02/18/2021   UIBC 269 02/18/2021   IRONPCTSAT 14 (L) 02/18/2021   Lab Results  Component Value Date   RETICCTPCT 1.9 05/23/2021   RBC 4.15 (L) 05/23/2021   RBC 4.17 (L) 05/23/2021   RETICCTABS 63.0 06/20/2015   No results found for: [facility] Lab Results  Component Value Date   IGA 139 08/26/2017   No results found for: Odetta Pink, SPEI   Chemistry      Component Value Date/Time   NA 138 02/18/2021 0831   NA 141 09/25/2016 0809   K 4.0 02/18/2021 0831   K 2.8 (LL) 09/25/2016 0809   CL 103 02/18/2021 0831   CL 102 07/31/2016 0815   CO2 29 02/18/2021 0831   CO2 28 09/25/2016 0809   BUN 10 02/18/2021 0831   BUN 13.1 09/25/2016 0809   CREATININE 1.32 (H) 02/18/2021 0831   CREATININE 1.2 09/25/2016 0809      Component Value Date/Time   CALCIUM 10.3 02/18/2021 0831   CALCIUM 9.5  09/25/2016 0809   ALKPHOS 103 02/18/2021 0831   ALKPHOS 126 09/25/2016 0809   AST 17 02/18/2021 0831   AST 19 09/25/2016 0809   ALT 24 02/18/2021 0831   ALT 23 09/25/2016 0809   BILITOT 0.3 02/18/2021 0831   BILITOT 0.45 09/25/2016 0809       Impression and Plan: James Zuniga is a very pleasant 65 yo African American gentleman with multifactorial anemia including chronic iron deficiency.  He received his B 12 injection today as planned.  Iron studies are pending.  B 12 injection monthly. Follow-up in 3 months.  He can contact our office with any questions or concerns.   Lottie Dawson, NP 11/11/20229:01 AM

## 2021-05-23 NOTE — Patient Instructions (Signed)
Vitamin B12 Deficiency Vitamin B12 deficiency means that your body does not have enough vitamin B12. The body needs this important vitamin: To make red blood cells. To make genes (DNA). To help the nerves work. If you do not have enough vitamin B12 in your body, you can have health problems, such as not having enough red blood cells in the blood (anemia). What are the causes? Not eating enough foods that contain vitamin B12. Not being able to take in (absorb) vitamin B12 from the food that you eat. Certain diseases. A condition in which the body does not make enough of a certain protein. This results in your body not taking in enough vitamin B12. Having a surgery in which part of the stomach or small intestine is taken out. Taking medicines that make it hard for the body to take in vitamin B12. These include: Heartburn medicines. Some medicines that are used to treat diabetes. What increases the risk? Being an older adult. Eating a vegetarian or vegan diet that does not include any foods that come from animals. Not eating enough foods that contain vitamin B12 while you are pregnant. Taking certain medicines. Having alcoholism. What are the signs or symptoms? In some cases, there are no symptoms. If the condition leads to too few blood cells or nerve damage, symptoms can occur, such as: Feeling weak or tired. Not being hungry. Losing feeling (numbness) or tingling in your hands and feet. Redness and burning of the tongue. Feeling sad (depressed). Confusion or memory problems. Trouble walking. If anemia is very bad, symptoms can include: Being short of breath. Being dizzy. Having a very fast heartbeat. How is this treated? Changing the way you eat and drink, such as: Eating more foods that contain vitamin B12. Drinking little or no alcohol. Getting vitamin B12 shots. Taking vitamin B12 supplements by mouth (orally). Your doctor will tell you the dose that is best for you. Follow  these instructions at home: Eating and drinking  Eat foods that come from animals and have a lot of vitamin B12 in them. These include: Meats and poultry. This includes beef, pork, chicken, turkey, and organ meats, such as liver. Seafood, such as clams, rainbow trout, salmon, tuna, and haddock. Eggs. Dairy foods such as milk, yogurt, and cheese. Eat breakfast cereals that have vitamin B12 added to them (are fortified). Check the label. The items listed above may not be a complete list of foods and beverages you can eat and drink. Contact a dietitian for more information. Alcohol use Do not drink alcohol if: Your doctor tells you not to drink. You are pregnant, may be pregnant, or are planning to become pregnant. If you drink alcohol: Limit how much you have to: 0-1 drink a day for women. 0-2 drinks a day for men. Know how much alcohol is in your drink. In the [place] one drink equals one 12 oz bottle of beer (355 mL), one 5 oz glass of wine (148 mL), or one 1 oz glass of hard liquor (44 mL). General instructions Get any vitamin B12 shots if told by your doctor. Take supplements only as told by your doctor. Follow the directions. Keep all follow-up visits. Contact a doctor if: Your symptoms come back. Your symptoms get worse or do not get better with treatment. Get help right away if: You have trouble breathing. You have a very fast heartbeat. You have chest pain. You get dizzy. You faint. These symptoms may be an emergency. Get help right away. Call 911.   Do not wait to see if the symptoms will go away. Do not drive yourself to the hospital. Summary Vitamin B12 deficiency means that your body is not getting enough of the vitamin. In some cases, there are no symptoms of this condition. Treatment may include making a change in the way you eat and drink, getting shots, or taking supplements. Eat foods that have vitamin B12 in them. This information is not intended to replace advice  given to you by your health care provider. Make sure you discuss any questions you have with your health care provider. Document Revised: 02/21/2021 Document Reviewed: 02/21/2021 Elsevier Patient Education  2022 Elsevier Inc.  

## 2021-05-26 ENCOUNTER — Telehealth: Payer: Self-pay | Admitting: *Deleted

## 2021-05-26 NOTE — Telephone Encounter (Signed)
Per scheduling message Judson Roch - Called and lvm to call back to get scheduled for (2) doses of Iron.

## 2021-05-26 NOTE — Telephone Encounter (Signed)
Per 05/26/21 los - called and lvm of upcoming appointments - requested call back to confirm - mailed calendar

## 2021-05-28 ENCOUNTER — Telehealth: Payer: Self-pay | Admitting: Family

## 2021-06-03 ENCOUNTER — Inpatient Hospital Stay: Payer: Medicare Other

## 2021-06-03 ENCOUNTER — Other Ambulatory Visit: Payer: Self-pay

## 2021-06-03 VITALS — BP 93/66 | HR 64 | Temp 98.3°F | Resp 18

## 2021-06-03 DIAGNOSIS — D509 Iron deficiency anemia, unspecified: Secondary | ICD-10-CM

## 2021-06-03 DIAGNOSIS — D510 Vitamin B12 deficiency anemia due to intrinsic factor deficiency: Secondary | ICD-10-CM | POA: Diagnosis not present

## 2021-06-03 MED ORDER — SODIUM CHLORIDE 0.9 % IV SOLN: Freq: Once | INTRAVENOUS | Status: DC

## 2021-06-03 MED ORDER — SODIUM CHLORIDE 0.9 % IV SOLN
125.0000 mg | Freq: Once | INTRAVENOUS | Status: AC
  Administered 2021-06-03: 125 mg via INTRAVENOUS
  Filled 2021-06-03: qty 125

## 2021-06-03 MED ORDER — SODIUM CHLORIDE 0.9 % IV SOLN: Freq: Once | INTRAVENOUS | Status: AC

## 2021-06-03 NOTE — Patient Instructions (Signed)
Sodium Ferric Gluconate Complex Injection ?What is this medication? ?SODIUM FERRIC GLUCONATE COMPLEX (SOE dee um FER ik GLOO koe nate KOM pleks) treats low levels of iron (iron deficiency anemia) in people with kidney disease. Iron is a mineral that plays an important role in making red blood cells, which carry oxygen from your lungs to the rest of your body. ?This medicine may be used for other purposes; ask your health care provider or pharmacist if you have questions. ?COMMON BRAND NAME(S): Ferrlecit, Nulecit ?What should I tell my care team before I take this medication? ?They need to know if you have any of the following conditions: ?Anemia that is not from iron deficiency ?High levels of iron in the blood ?An unusual or allergic reaction to iron, other medications, foods, dyes, or preservatives ?Pregnant or are trying to become pregnant ?Breast-feeding ?How should I use this medication? ?This medication is injected into a vein. It is given by your care team in a hospital or clinic setting. ?Talk to your care team about the use of this medication in children. While it may be prescribed for children as young as 6 years for selected conditions, precautions do apply. ?Overdosage: If you think you have taken too much of this medicine contact a poison control center or emergency room at once. ?NOTE: This medicine is only for you. Do not share this medicine with others. ?What if I miss a dose? ?It is important not to miss your dose. Call your care team if you are unable to keep an appointment. ?What may interact with this medication? ?Do not take this medication with any of the following: ?Deferasirox ?Deferoxamine ?Dimercaprol ?This medication may also interact with the following: ?Other iron products ?This list may not describe all possible interactions. Give your health care provider a list of all the medicines, herbs, non-prescription drugs, or dietary supplements you use. Also tell them if you smoke, drink  alcohol, or use illegal drugs. Some items may interact with your medicine. ?What should I watch for while using this medication? ?Your condition will be monitored carefully while you are receiving this medication. ?Visit your care team for regular checks on your progress. You may need blood work while you are taking this medication. ?What side effects may I notice from receiving this medication? ?Side effects that you should report to your care team as soon as possible: ?Allergic reactions--skin rash, itching, hives, swelling of the face, lips, tongue, or throat ?Low blood pressure--dizziness, feeling faint or lightheaded, blurry vision ?Shortness of breath ?Side effects that usually do not require medical attention (report to your care team if they continue or are bothersome): ?Flushing ?Headache ?Joint pain ?Muscle pain ?Nausea ?Pain, redness, or irritation at injection site ?This list may not describe all possible side effects. Call your doctor for medical advice about side effects. You may report side effects to FDA at 1-800-FDA-1088. ?Where should I keep my medication? ?This medication is given in a hospital or clinic and will not be stored at home. ?NOTE: This sheet is a summary. It may not cover all possible information. If you have questions about this medicine, talk to your doctor, pharmacist, or health care provider. ?? 2022 Elsevier/Gold Standard (2020-11-22 00:00:00) ? ?

## 2021-06-10 ENCOUNTER — Ambulatory Visit: Payer: Medicare Other

## 2021-06-10 ENCOUNTER — Inpatient Hospital Stay: Payer: Medicare Other

## 2021-06-10 ENCOUNTER — Other Ambulatory Visit: Payer: Self-pay

## 2021-06-10 VITALS — BP 115/70 | HR 80 | Temp 99.3°F | Resp 18

## 2021-06-10 DIAGNOSIS — D510 Vitamin B12 deficiency anemia due to intrinsic factor deficiency: Secondary | ICD-10-CM | POA: Diagnosis not present

## 2021-06-10 DIAGNOSIS — D509 Iron deficiency anemia, unspecified: Secondary | ICD-10-CM

## 2021-06-10 MED ORDER — SODIUM CHLORIDE 0.9 % IV SOLN
125.0000 mg | Freq: Once | INTRAVENOUS | Status: AC
  Administered 2021-06-10: 125 mg via INTRAVENOUS
  Filled 2021-06-10: qty 125

## 2021-06-10 MED ORDER — SODIUM CHLORIDE 0.9 % IV SOLN: Freq: Once | INTRAVENOUS | Status: AC

## 2021-06-10 MED ORDER — CYANOCOBALAMIN 1000 MCG/ML IJ SOLN: 1000.0000 ug | Freq: Once | INTRAMUSCULAR | Status: DC

## 2021-06-23 ENCOUNTER — Inpatient Hospital Stay: Payer: Medicare Other | Attending: Family

## 2021-06-23 ENCOUNTER — Ambulatory Visit: Payer: Medicare Other

## 2021-06-23 ENCOUNTER — Other Ambulatory Visit: Payer: Self-pay

## 2021-06-23 VITALS — BP 101/71 | HR 82 | Temp 97.8°F | Resp 18

## 2021-06-23 DIAGNOSIS — D509 Iron deficiency anemia, unspecified: Secondary | ICD-10-CM

## 2021-06-23 DIAGNOSIS — D510 Vitamin B12 deficiency anemia due to intrinsic factor deficiency: Secondary | ICD-10-CM | POA: Insufficient documentation

## 2021-06-23 MED ORDER — CYANOCOBALAMIN 1000 MCG/ML IJ SOLN
1000.0000 ug | Freq: Once | INTRAMUSCULAR | Status: AC
  Administered 2021-06-23: 1000 ug via INTRAMUSCULAR
  Filled 2021-06-23: qty 1

## 2021-06-23 NOTE — Patient Instructions (Signed)

## 2021-07-23 ENCOUNTER — Inpatient Hospital Stay: Payer: Medicare Other | Attending: Family

## 2021-07-23 ENCOUNTER — Encounter (HOSPITAL_BASED_OUTPATIENT_CLINIC_OR_DEPARTMENT_OTHER): Payer: Self-pay

## 2021-07-23 ENCOUNTER — Other Ambulatory Visit: Payer: Self-pay

## 2021-07-23 ENCOUNTER — Ambulatory Visit: Payer: Medicare Other

## 2021-07-23 ENCOUNTER — Other Ambulatory Visit: Payer: Self-pay | Admitting: *Deleted

## 2021-07-23 ENCOUNTER — Emergency Department (HOSPITAL_BASED_OUTPATIENT_CLINIC_OR_DEPARTMENT_OTHER): Payer: Medicare Other

## 2021-07-23 ENCOUNTER — Emergency Department (HOSPITAL_BASED_OUTPATIENT_CLINIC_OR_DEPARTMENT_OTHER)
Admission: EM | Admit: 2021-07-23 | Discharge: 2021-07-23 | Disposition: A | Discharge status: Home / Self Care | Payer: Medicare Other | Attending: Emergency Medicine | Admitting: Emergency Medicine

## 2021-07-23 ENCOUNTER — Inpatient Hospital Stay: Payer: Medicare Other

## 2021-07-23 ENCOUNTER — Other Ambulatory Visit: Payer: Self-pay | Admitting: Family

## 2021-07-23 VITALS — BP 115/66 | HR 74 | Temp 98.5°F | Resp 18

## 2021-07-23 DIAGNOSIS — Z79899 Other long term (current) drug therapy: Secondary | ICD-10-CM | POA: Diagnosis not present

## 2021-07-23 DIAGNOSIS — D509 Iron deficiency anemia, unspecified: Secondary | ICD-10-CM

## 2021-07-23 DIAGNOSIS — Z20822 Contact with and (suspected) exposure to covid-19: Secondary | ICD-10-CM | POA: Diagnosis not present

## 2021-07-23 DIAGNOSIS — R42 Dizziness and giddiness: Secondary | ICD-10-CM | POA: Insufficient documentation

## 2021-07-23 DIAGNOSIS — R29810 Facial weakness: Secondary | ICD-10-CM | POA: Diagnosis not present

## 2021-07-23 DIAGNOSIS — D631 Anemia in chronic kidney disease: Secondary | ICD-10-CM | POA: Diagnosis not present

## 2021-07-23 DIAGNOSIS — R531 Weakness: Secondary | ICD-10-CM | POA: Diagnosis not present

## 2021-07-23 DIAGNOSIS — D510 Vitamin B12 deficiency anemia due to intrinsic factor deficiency: Secondary | ICD-10-CM

## 2021-07-23 DIAGNOSIS — D500 Iron deficiency anemia secondary to blood loss (chronic): Secondary | ICD-10-CM

## 2021-07-23 DIAGNOSIS — Z7902 Long term (current) use of antithrombotics/antiplatelets: Secondary | ICD-10-CM | POA: Diagnosis not present

## 2021-07-23 DIAGNOSIS — R0602 Shortness of breath: Secondary | ICD-10-CM | POA: Diagnosis not present

## 2021-07-23 LAB — RETICULOCYTES
Immature Retic Fract: 21.9 % — ABNORMAL HIGH (ref 2.3–15.9)
RBC.: 4.29 MIL/uL (ref 4.22–5.81)
Retic Count, Absolute: 88.8 10*3/uL (ref 19.0–186.0)
Retic Ct Pct: 2.1 % (ref 0.4–3.1)

## 2021-07-23 LAB — CBC
HCT: 37.2 % — ABNORMAL LOW (ref 39.0–52.0)
Hemoglobin: 11.9 g/dL — ABNORMAL LOW (ref 13.0–17.0)
MCH: 27.9 pg (ref 26.0–34.0)
MCHC: 32 g/dL (ref 30.0–36.0)
MCV: 87.3 fL (ref 80.0–100.0)
Platelets: 311 10*3/uL (ref 150–400)
RBC: 4.26 MIL/uL (ref 4.22–5.81)
RDW: 14.6 % (ref 11.5–15.5)
WBC: 7.9 10*3/uL (ref 4.0–10.5)
nRBC: 0 % (ref 0.0–0.2)

## 2021-07-23 LAB — CBC WITH DIFFERENTIAL (CANCER CENTER ONLY)
Abs Immature Granulocytes: 0.05 10*3/uL (ref 0.00–0.07)
Basophils Absolute: 0 10*3/uL (ref 0.0–0.1)
Basophils Relative: 1 %
Eosinophils Absolute: 0.1 10*3/uL (ref 0.0–0.5)
Eosinophils Relative: 2 %
HCT: 37.6 % — ABNORMAL LOW (ref 39.0–52.0)
Hemoglobin: 11.9 g/dL — ABNORMAL LOW (ref 13.0–17.0)
Immature Granulocytes: 1 %
Lymphocytes Relative: 23 %
Lymphs Abs: 1.9 10*3/uL (ref 0.7–4.0)
MCH: 27.8 pg (ref 26.0–34.0)
MCHC: 31.6 g/dL (ref 30.0–36.0)
MCV: 87.9 fL (ref 80.0–100.0)
Monocytes Absolute: 0.5 10*3/uL (ref 0.1–1.0)
Monocytes Relative: 6 %
Neutro Abs: 5.9 10*3/uL (ref 1.7–7.7)
Neutrophils Relative %: 67 %
Platelet Count: 325 10*3/uL (ref 150–400)
RBC: 4.28 MIL/uL (ref 4.22–5.81)
RDW: 14.6 % (ref 11.5–15.5)
WBC Count: 8.5 10*3/uL (ref 4.0–10.5)
nRBC: 0 % (ref 0.0–0.2)

## 2021-07-23 LAB — DIFFERENTIAL
Abs Immature Granulocytes: 0.03 10*3/uL (ref 0.00–0.07)
Basophils Absolute: 0 10*3/uL (ref 0.0–0.1)
Basophils Relative: 0 %
Eosinophils Absolute: 0.1 10*3/uL (ref 0.0–0.5)
Eosinophils Relative: 1 %
Immature Granulocytes: 0 %
Lymphocytes Relative: 24 %
Lymphs Abs: 1.9 10*3/uL (ref 0.7–4.0)
Monocytes Absolute: 0.4 10*3/uL (ref 0.1–1.0)
Monocytes Relative: 5 %
Neutro Abs: 5.5 10*3/uL (ref 1.7–7.7)
Neutrophils Relative %: 70 %

## 2021-07-23 LAB — URINALYSIS, ROUTINE W REFLEX MICROSCOPIC
Bilirubin Urine: NEGATIVE
Glucose, UA: >=500 mg/dL — AB
Hgb urine dipstick: NEGATIVE
Ketones, ur: NEGATIVE mg/dL
Leukocytes,Ua: NEGATIVE
Nitrite: NEGATIVE
Protein, ur: NEGATIVE mg/dL
Specific Gravity, Urine: 1.01 (ref 1.005–1.030)
pH: 6 (ref 5.0–8.0)

## 2021-07-23 LAB — RAPID URINE DRUG SCREEN, HOSP PERFORMED
Amphetamines: NOT DETECTED
Barbiturates: POSITIVE — AB
Benzodiazepines: NOT DETECTED
Cocaine: NOT DETECTED
Opiates: NOT DETECTED
Tetrahydrocannabinol: NOT DETECTED

## 2021-07-23 LAB — RESP PANEL BY RT-PCR (FLU A&B, COVID) ARPGX2
Influenza A by PCR: NEGATIVE
Influenza B by PCR: NEGATIVE
SARS Coronavirus 2 by RT PCR: NEGATIVE

## 2021-07-23 LAB — COMPREHENSIVE METABOLIC PANEL
ALT: 24 U/L (ref 0–44)
AST: 25 U/L (ref 15–41)
Albumin: 3.5 g/dL (ref 3.5–5.0)
Alkaline Phosphatase: 114 U/L (ref 38–126)
Anion gap: 9 (ref 5–15)
BUN: 13 mg/dL (ref 8–23)
CO2: 22 mmol/L (ref 22–32)
Calcium: 10.4 mg/dL — ABNORMAL HIGH (ref 8.9–10.3)
Chloride: 97 mmol/L — ABNORMAL LOW (ref 98–111)
Creatinine, Ser: 1.42 mg/dL — ABNORMAL HIGH (ref 0.61–1.24)
GFR, Estimated: 54 mL/min — ABNORMAL LOW (ref >60)
Glucose, Bld: 329 mg/dL — ABNORMAL HIGH (ref 70–99)
Potassium: 4.6 mmol/L (ref 3.5–5.1)
Sodium: 128 mmol/L — ABNORMAL LOW (ref 135–145)
Total Bilirubin: 0.5 mg/dL (ref 0.3–1.2)
Total Protein: 7.9 g/dL (ref 6.5–8.1)

## 2021-07-23 LAB — CMP (CANCER CENTER ONLY)
ALT: 21 U/L (ref 0–44)
AST: 16 U/L (ref 15–41)
Albumin: 4.2 g/dL (ref 3.5–5.0)
Alkaline Phosphatase: 135 U/L — ABNORMAL HIGH (ref 38–126)
Anion gap: 8 (ref 5–15)
BUN: 12 mg/dL (ref 8–23)
CO2: 26 mmol/L (ref 22–32)
Calcium: 11.3 mg/dL — ABNORMAL HIGH (ref 8.9–10.3)
Chloride: 98 mmol/L (ref 98–111)
Creatinine: 1.47 mg/dL — ABNORMAL HIGH (ref 0.61–1.24)
GFR, Estimated: 52 mL/min — ABNORMAL LOW (ref >60)
Glucose, Bld: 321 mg/dL — ABNORMAL HIGH (ref 70–99)
Potassium: 4.3 mmol/L (ref 3.5–5.1)
Sodium: 132 mmol/L — ABNORMAL LOW (ref 135–145)
Total Bilirubin: 0.4 mg/dL (ref 0.3–1.2)
Total Protein: 7.6 g/dL (ref 6.5–8.1)

## 2021-07-23 LAB — URINALYSIS, MICROSCOPIC (REFLEX)

## 2021-07-23 LAB — PROTIME-INR
INR: 0.9 (ref 0.8–1.2)
Prothrombin Time: 12.5 seconds (ref 11.4–15.2)

## 2021-07-23 LAB — SAMPLE TO BLOOD BANK

## 2021-07-23 LAB — ETHANOL: Alcohol, Ethyl (B): <10 mg/dL (ref <10)

## 2021-07-23 LAB — TROPONIN I (HIGH SENSITIVITY): Troponin I (High Sensitivity): 5 ng/L (ref <18)

## 2021-07-23 LAB — CBG MONITORING, ED: Glucose-Capillary: 337 mg/dL — ABNORMAL HIGH (ref 70–99)

## 2021-07-23 LAB — VITAMIN B12: Vitamin B-12: >7500 pg/mL — ABNORMAL HIGH (ref 180–914)

## 2021-07-23 LAB — APTT: aPTT: 27 seconds (ref 24–36)

## 2021-07-23 MED ORDER — CYANOCOBALAMIN 1000 MCG/ML IJ SOLN
1000.0000 ug | Freq: Once | INTRAMUSCULAR | Status: AC
  Administered 2021-07-23: 1000 ug via INTRAMUSCULAR
  Filled 2021-07-23: qty 1

## 2021-07-23 MED ORDER — MECLIZINE HCL 12.5 MG PO TABS: 12.5000 mg | ORAL_TABLET | Freq: Three times a day (TID) | ORAL | 0 refills | Status: AC | PRN

## 2021-07-23 MED ORDER — MECLIZINE HCL 25 MG PO TABS
25.0000 mg | ORAL_TABLET | Freq: Once | ORAL | Status: AC
  Administered 2021-07-23: 25 mg via ORAL
  Filled 2021-07-23: qty 1

## 2021-07-23 MED ORDER — IOHEXOL 350 MG/ML SOLN
75.0000 mL | Freq: Once | INTRAVENOUS | Status: AC | PRN
  Administered 2021-07-23: 80 mL via INTRAVENOUS

## 2021-07-23 NOTE — ED Provider Notes (Signed)
[facility] EMERGENCY DEPARTMENT Provider Note   CSN: [id number] Arrival date & time: 07/23/21  1503     History  Chief Complaint  Patient presents with   Weakness    James Zuniga is a 66 y.o. male hx of stroke with L sided residual weakness, who presented with worsening weakness and dizziness and shortness of breath.  Patient states that for the last 2 to 3 weeks, patient has progressively worsening weakness on the left side.  For the last week or so, wife noticed that his balance is very off.  Patient also has been falling more often.  Patient complains some shortness of breath as well.  Patient apparently went to oncology upstairs was sent down for further evaluation.  Patient states that he is not on any blood thinners. His last stroke was in 2018.   The history is provided by the patient.      Home Medications Prior to Admission medications   Medication Sig Start Date End Date Taking? Authorizing Provider  albuterol (VENTOLIN HFA) 108 (90 Base) MCG/ACT inhaler Inhale 2 puffs into the lungs every 6 (six) hours as needed for wheezing or shortness of breath. 09/08/19   Chesley Mires, MD  allopurinol (ZYLOPRIM) 300 MG tablet Take 300 mg by mouth daily.    [provider]  amLODipine (NORVASC) 5 MG tablet Take 5 mg by mouth daily.  07/14/17   [provider]  atorvastatin (LIPITOR) 80 MG tablet Take 1 tablet (80 mg total) by mouth every morning. 12/25/16   Alphonzo Grieve, MD  atorvastatin (LIPITOR) 80 MG tablet Take 1 tablet by mouth daily. 04/07/21   [provider]  Blood Glucose Monitoring Suppl (FIFTY50 GLUCOSE METER 2.0) w/Device KIT To check blood sugars TID or Use as instructed Include strips. Lancets, lancet device, control solution. batteries 04/11/19   [provider]  clopidogrel (PLAVIX) 75 MG tablet Take 1 tablet (75 mg total) by mouth daily. 12/26/16   Alphonzo Grieve, MD  colchicine 0.6 MG tablet Take 0.6 mg by mouth. 03/04/21    [provider]  Cyanocobalamin (VITAMIN B-12 IJ) Inject 1,000 mg as directed every 30 (thirty) days. X 4    [provider]  diclofenac sodium (VOLTAREN) 1 % GEL Use 4 grams to knees tid prn 10/31/18   [provider]  diclofenac Sodium (VOLTAREN) 1 % GEL SMARTSIG:4 Gram(s) Topical 3 Times Daily PRN 04/07/21   [provider]  DULoxetine (CYMBALTA) 60 MG capsule Take 60 mg by mouth 2 (two) times daily.     [provider]  furosemide (LASIX) 20 MG tablet Take 20 mg by mouth every Tuesday, Thursday, and Saturday at 6 PM.  03/21/13   [provider]  gabapentin (NEURONTIN) 300 MG capsule Take 300 mg by mouth 4 (four) times daily.  10/18/14   [provider]  glucose blood test strip 1 each by Misc.(Non-Drug; Combo Route) route daily. 05/26/17   [provider]  insulin NPH-regular Human (NOVOLIN 70/30) (70-30) 100 UNIT/ML injection Please inject 25 units twice daily before breakfast and dinner. Patient taking differently: Inject 25-30 Units into the skin See admin instructions. Inject 30 units with breakfast & 28 units with dinner. 08/26/17   Lorella Nimrod, MD  Insulin Pen Needle (FIFTY50 PEN NEEDLES) 32G X 4 MM MISC 1 each by Misc.(Non-Drug; Combo Route) route daily. 04/18/19   [provider]  insulin regular (NOVOLIN R) 100 units/mL injection Use as sliding scale up to 10 units tid  with meals 08/25/19   [provider]  ipratropium-albuterol (DUONEB) 0.5-2.5 (3) MG/3ML SOLN USE 1 VIAL IN NEBULIZER 4 TIMES DAILY 05/22/21   Chesley Mires, MD  Lancets [facility] ULTRASOFT) lancets USE 1 LANCET TO CHECK GLUCOSE IN THE MORNING BEFORE BREAKFAST 03/28/18   [provider]  losartan (COZAAR) 100 MG tablet Take 100 mg by mouth daily.  10/15/16   [provider]  methocarbamol (ROBAXIN) 500 MG tablet Take 500 mg by mouth 3 (three) times daily. 04/09/21   [provider]  metoprolol (TOPROL-XL) 200 MG 24 hr  tablet Take 200 mg by mouth daily. 04/04/20   [provider]  pantoprazole (PROTONIX) 20 MG tablet Take 20 mg by mouth daily.  08/30/17   [provider]  potassium chloride SA (K-DUR,KLOR-CON) 20 MEQ tablet Take 1 tablet (20 mEq total) by mouth 2 (two) times daily. Patient taking differently: Take 20 mEq by mouth 3 (three) times daily. Take 1 tablet (20 meq) scheduled three times daily & takes an additional tablet at lunch on Tuesdays, Thursdays, & Saturdays due to lasix 05/03/15   Volanda Napoleon, MD  primidone (MYSOLINE) 50 MG tablet Take 100 mg by mouth at bedtime.  01/26/17   [provider]  sildenafil (VIAGRA) 100 MG tablet Take 1 tablet by mouth daily as needed. 04/09/21   [provider]  spironolactone (ALDACTONE) 25 MG tablet Take 25 mg by mouth daily. 08/18/19   [provider]  tamsulosin (FLOMAX) 0.4 MG CAPS capsule Take 1 capsule (0.4 mg total) by mouth daily after breakfast. 12/06/15   Volanda Napoleon, MD  tiZANidine (ZANAFLEX) 4 MG tablet Take 4 mg by mouth 3 (three) times daily as needed for muscle spasms.  10/04/14   [provider]  TOUJEO SOLOSTAR 300 UNIT/ML Solostar Pen Inject into the skin. 04/28/21   [provider]  traMADol (ULTRAM) 50 MG tablet Take 50 mg by mouth daily as needed for moderate pain.  09/08/13   [provider]  TRESIBA FLEXTOUCH 200 UNIT/ML SOPN 63 Units. 60 units once daily per patient. 02/28/19   [provider]  umeclidinium-vilanterol (ANORO ELLIPTA) 62.5-25 MCG/INH AEPB Inhale 1 puff into the lungs daily. 04/03/21   Chesley Mires, MD  zolpidem (AMBIEN CR) 12.5 MG CR tablet Take 12.5 mg by mouth at bedtime as needed. 05/12/21   [provider]      Allergies    Patient has no known allergies.    Review of Systems   Review of Systems  Respiratory:  Positive for shortness of breath.   Neurological:  Positive for weakness.  All other systems reviewed and are  negative.  Physical Exam Updated Vital Signs BP (!) 117/92    Pulse 78    Temp 98.3 F (36.8 C) (Oral)    Resp 15    Ht 5' 6"  (1.676 m)    Wt 106.6 kg    SpO2 100%    BMI 37.93 kg/m  Physical Exam Vitals and nursing note reviewed.  Constitutional:      Appearance: Normal appearance.     Comments: Chronically ill  HENT:     Head: Normocephalic.     Nose: Nose normal.     Mouth/Throat:     Mouth: Mucous membranes are moist.  Eyes:     Extraocular Movements: Extraocular movements intact.     Pupils: Pupils are equal, round, and reactive to light.  Cardiovascular:     Rate and Rhythm: Normal rate and  regular rhythm.     Pulses: Normal pulses.     Heart sounds: Normal heart sounds.  Pulmonary:     Effort: Pulmonary effort is normal.     Breath sounds: Normal breath sounds.  Abdominal:     Palpations: Abdomen is soft.  Musculoskeletal:        General: Normal range of motion.     Cervical back: Normal range of motion and neck supple.  Skin:    General: Skin is warm.     Capillary Refill: Capillary refill takes less than 2 seconds.  Neurological:     Mental Status: He is alert.     Comments: No obvious facial droop, patient does have abnormal finger-to-nose on the left side.  Patient strength is 4 out of 5 on the left arm and leg which is chronic.   Psychiatric:        Mood and Affect: Mood normal.    ED Results / Procedures / Treatments   Labs (all labs ordered are listed, but only abnormal results are displayed) Labs Reviewed  CBC - Abnormal; Notable for the following components:      Result Value   Hemoglobin 11.9 (*)    HCT 37.2 (*)    All other components within normal limits  CBG MONITORING, ED - Abnormal; Notable for the following components:   Glucose-Capillary 337 (*)    All other components within normal limits  RESP PANEL BY RT-PCR (FLU A&B, COVID) ARPGX2  DIFFERENTIAL  ETHANOL  PROTIME-INR  APTT  COMPREHENSIVE METABOLIC PANEL  RAPID URINE DRUG SCREEN,  HOSP PERFORMED  URINALYSIS, ROUTINE W REFLEX MICROSCOPIC  TROPONIN I (HIGH SENSITIVITY)    EKG None  Radiology DG Chest Port 1 View  Result Date: 07/23/2021 CLINICAL DATA:  Shortness of breath EXAM: PORTABLE CHEST 1 VIEW COMPARISON:  02/13/2021 FINDINGS: Mild cardiomegaly. Loop recorder projects over the left heart border. No focal pulmonary opacity. No pleural effusion or pneumothorax. No acute osseous abnormality. IMPRESSION: Mild cardiomegaly without acute cardiopulmonary process. Electronically Signed   By: Merilyn Baba M.D.   On: 07/23/2021 15:41    Procedures Procedures    Medications Ordered in ED Medications - No data to display  ED Course/ Medical Decision Making/ A&P                           Medical Decision Making DENG KEMLER is a 66 y.o. male here presenting with left-sided weakness and dizziness and trouble walking.  Patient has a history of stroke previously with residual left-sided weakness.  Symptoms onset for several weeks already.  I am concerned for possible subacute stroke at this point.  We will get CTA head and neck.  We will get CBC CMP and likely consult neurology   6:17 PM CTA showed chronic small vessel disease.  There is no obvious dissection or LVO.  I discussed case with Dr. Theda Sers from neurology.  Since his symptom onset has been for about a week or so, patient does not need admission for stroke work-up.  Patient had outpatient neuro follow-up and MRI.  Patient is able to ambulate with his cane but does have a wide-based gait.  Offered transfer to [facility] for MRI but patient does want to just go home and follow-up with neurology.  We will prescribe some meclizine as needed for dizziness  Amount and/or Complexity of Data Reviewed Independent Historian: spouse External Data Reviewed: labs and radiology. Labs: ordered. Radiology: ordered and  independent interpretation performed. ECG/medicine tests: ordered.  Risk Prescription drug  management.    Final Clinical Impression(s) / ED Diagnoses Final diagnoses:  None    Rx / DC Orders ED Discharge Orders     None         Drenda Freeze, MD 07/23/21 1818

## 2021-07-23 NOTE — ED Triage Notes (Signed)
Pt arrives to ED in wheelchair from upstairs where he was being seen for weakness that he has been having for 3 weeks. Pt reports he has history of previous stroke with left sided weakness, worsening weakness and soreness to his left side. Pt is poor history on timeline of events. Darl Householder ED MD at bedside. Wife reports over the last week she has noticed patient has been 'more clumsy'.

## 2021-07-23 NOTE — Discharge Instructions (Addendum)
You likely have vertigo.  Take meclizine as needed for dizziness.  Your CT scan and labs were unremarkable today  I discussed case with our neurologist.  He recommend that you follow-up with your neurologist either in [place] or in [place].  Consider getting MRI if your symptoms are not getting better.  Return to ER if you have worse dizziness, trouble walking, trouble speaking, weakness

## 2021-07-23 NOTE — ED Notes (Signed)
Left urinal at bedside. Pt unable to give sample at present time

## 2021-07-23 NOTE — Patient Instructions (Signed)
Vitamin B12 Injection °What is this medication? °Vitamin B12 (VAHY tuh min B12) prevents and treats low vitamin B12 levels in your body. It is used in people who do not get enough vitamin B12 from their diet or when their digestive tract does not absorb enough. Vitamin B12 plays an important role in maintaining the health of your nervous system and red blood cells. °This medicine may be used for other purposes; ask your health care provider or pharmacist if you have questions. °COMMON BRAND NAME(S): B-12 Compliance Kit, B-12 Injection Kit, Cyomin, Dodex, LA-12, Nutri-Twelve, Physicians EZ Use B-12, Primabalt °What should I tell my care team before I take this medication? °They need to know if you have any of these conditions: °Kidney disease °Leber's disease °Megaloblastic anemia °An unusual or allergic reaction to cyanocobalamin, cobalt, other medications, foods, dyes, or preservatives °Pregnant or trying to get pregnant °Breast-feeding °How should I use this medication? °This medication is injected into a muscle or deeply under the skin. It is usually given in a clinic or care team's office. However, your care team may teach you how to inject yourself. Follow all instructions. °Talk to your care team about the use of this medication in children. Special care may be needed. °Overdosage: If you think you have taken too much of this medicine contact a poison control center or emergency room at once. °NOTE: This medicine is only for you. Do not share this medicine with others. °What if I miss a dose? °If you are given your dose at a clinic or care team's office, call to reschedule your appointment. If you give your own injections, and you miss a dose, take it as soon as you can. If it is almost time for your next dose, take only that dose. Do not take double or extra doses. °What may interact with this medication? °Colchicine °Heavy alcohol intake °This list may not describe all possible interactions. Give your health  care provider a list of all the medicines, herbs, non-prescription drugs, or dietary supplements you use. Also tell them if you smoke, drink alcohol, or use illegal drugs. Some items may interact with your medicine. °What should I watch for while using this medication? °Visit your care team regularly. You may need blood work done while you are taking this medication. °You may need to follow a special diet. Talk to your care team. Limit your alcohol intake and avoid smoking to get the best benefit. °What side effects may I notice from receiving this medication? °Side effects that you should report to your care team as soon as possible: °Allergic reactions--skin rash, itching, hives, swelling of the face, lips, tongue, or throat °Swelling of the ankles, hands, or feet °Trouble breathing °Side effects that usually do not require medical attention (report to your care team if they continue or are bothersome): °Diarrhea °This list may not describe all possible side effects. Call your doctor for medical advice about side effects. You may report side effects to FDA at 1-800-FDA-1088. °Where should I keep my medication? °Keep out of the reach of children. °Store at room temperature between 15 and 30 degrees C (59 and 85 degrees F). Protect from light. Throw away any unused medication after the expiration date. °NOTE: This sheet is a summary. It may not cover all possible information. If you have questions about this medicine, talk to your doctor, pharmacist, or health care provider. °© 2022 Elsevier/Gold Standard (2020-09-11 00:00:00) ° °

## 2021-07-24 LAB — IRON AND IRON BINDING CAPACITY (CC-WL,HP ONLY)
Iron: 52 ug/dL (ref 45–182)
Saturation Ratios: 16 % — ABNORMAL LOW (ref 17.9–39.5)
TIBC: 330 ug/dL (ref 250–450)
UIBC: 278 ug/dL (ref 117–376)

## 2021-07-24 LAB — FERRITIN: Ferritin: 244 ng/mL (ref 24–336)

## 2021-07-25 ENCOUNTER — Telehealth: Payer: Self-pay | Admitting: Family

## 2021-07-25 ENCOUNTER — Other Ambulatory Visit: Payer: Self-pay | Admitting: Family

## 2021-07-28 ENCOUNTER — Telehealth: Payer: Self-pay

## 2021-07-28 NOTE — Telephone Encounter (Signed)
Attempted to call all the numbers listed for patient. Unable to leave a message or get in contact with patient.

## 2021-07-29 ENCOUNTER — Encounter: Payer: Self-pay | Admitting: Hematology & Oncology

## 2021-07-29 NOTE — Telephone Encounter (Signed)
Called to schedule

## 2021-08-20 ENCOUNTER — Inpatient Hospital Stay (HOSPITAL_BASED_OUTPATIENT_CLINIC_OR_DEPARTMENT_OTHER): Payer: Medicare Other | Admitting: Family

## 2021-08-20 ENCOUNTER — Inpatient Hospital Stay: Payer: Medicare Other | Attending: Family

## 2021-08-20 ENCOUNTER — Other Ambulatory Visit: Payer: Self-pay

## 2021-08-20 ENCOUNTER — Inpatient Hospital Stay: Payer: Medicare Other

## 2021-08-20 ENCOUNTER — Encounter: Payer: Self-pay | Admitting: Family

## 2021-08-20 VITALS — BP 85/57 | HR 82 | Temp 98.4°F | Resp 18 | Wt 236.0 lb

## 2021-08-20 DIAGNOSIS — D500 Iron deficiency anemia secondary to blood loss (chronic): Secondary | ICD-10-CM | POA: Diagnosis not present

## 2021-08-20 DIAGNOSIS — D509 Iron deficiency anemia, unspecified: Secondary | ICD-10-CM | POA: Diagnosis not present

## 2021-08-20 DIAGNOSIS — D631 Anemia in chronic kidney disease: Secondary | ICD-10-CM

## 2021-08-20 DIAGNOSIS — D510 Vitamin B12 deficiency anemia due to intrinsic factor deficiency: Secondary | ICD-10-CM | POA: Insufficient documentation

## 2021-08-20 LAB — CBC WITH DIFFERENTIAL (CANCER CENTER ONLY)
Abs Immature Granulocytes: 0.1 10*3/uL — ABNORMAL HIGH (ref 0.00–0.07)
Basophils Absolute: 0 10*3/uL (ref 0.0–0.1)
Basophils Relative: 0 %
Eosinophils Absolute: 0.2 10*3/uL (ref 0.0–0.5)
Eosinophils Relative: 2 %
HCT: 37.5 % — ABNORMAL LOW (ref 39.0–52.0)
Hemoglobin: 11.8 g/dL — ABNORMAL LOW (ref 13.0–17.0)
Immature Granulocytes: 1 %
Lymphocytes Relative: 22 %
Lymphs Abs: 2 10*3/uL (ref 0.7–4.0)
MCH: 27.8 pg (ref 26.0–34.0)
MCHC: 31.5 g/dL (ref 30.0–36.0)
MCV: 88.4 fL (ref 80.0–100.0)
Monocytes Absolute: 0.5 10*3/uL (ref 0.1–1.0)
Monocytes Relative: 6 %
Neutro Abs: 6.3 10*3/uL (ref 1.7–7.7)
Neutrophils Relative %: 69 %
Platelet Count: 317 10*3/uL (ref 150–400)
RBC: 4.24 MIL/uL (ref 4.22–5.81)
RDW: 14.6 % (ref 11.5–15.5)
WBC Count: 9.1 10*3/uL (ref 4.0–10.5)
nRBC: 0 % (ref 0.0–0.2)

## 2021-08-20 LAB — CMP (CANCER CENTER ONLY)
ALT: 20 U/L (ref 0–44)
AST: 15 U/L (ref 15–41)
Albumin: 3.9 g/dL (ref 3.5–5.0)
Alkaline Phosphatase: 110 U/L (ref 38–126)
Anion gap: 7 (ref 5–15)
BUN: 15 mg/dL (ref 8–23)
CO2: 27 mmol/L (ref 22–32)
Calcium: 10.6 mg/dL — ABNORMAL HIGH (ref 8.9–10.3)
Chloride: 100 mmol/L (ref 98–111)
Creatinine: 1.51 mg/dL — ABNORMAL HIGH (ref 0.61–1.24)
GFR, Estimated: 51 mL/min — ABNORMAL LOW (ref >60)
Glucose, Bld: 224 mg/dL — ABNORMAL HIGH (ref 70–99)
Potassium: 4.5 mmol/L (ref 3.5–5.1)
Sodium: 134 mmol/L — ABNORMAL LOW (ref 135–145)
Total Bilirubin: 0.4 mg/dL (ref 0.3–1.2)
Total Protein: 7.5 g/dL (ref 6.5–8.1)

## 2021-08-20 LAB — RETICULOCYTES
Immature Retic Fract: 24.3 % — ABNORMAL HIGH (ref 2.3–15.9)
RBC.: 4.25 MIL/uL (ref 4.22–5.81)
Retic Count, Absolute: 85.8 10*3/uL (ref 19.0–186.0)
Retic Ct Pct: 2 % (ref 0.4–3.1)

## 2021-08-20 LAB — SAMPLE TO BLOOD BANK

## 2021-08-20 MED ORDER — METHYLPREDNISOLONE SODIUM SUCC 125 MG IJ SOLR: 125.0000 mg | Freq: Once | INTRAMUSCULAR | Status: DC | PRN

## 2021-08-20 MED ORDER — ALBUTEROL SULFATE (2.5 MG/3ML) 0.083% IN NEBU: 2.5000 mg | INHALATION_SOLUTION | Freq: Once | RESPIRATORY_TRACT | Status: DC | PRN

## 2021-08-20 MED ORDER — FAMOTIDINE IN NACL 20-0.9 MG/50ML-% IV SOLN: 20.0000 mg | Freq: Once | INTRAVENOUS | Status: DC | PRN

## 2021-08-20 MED ORDER — EPINEPHRINE 0.3 MG/0.3ML IJ SOAJ: 0.3000 mg | Freq: Once | INTRAMUSCULAR | Status: DC | PRN

## 2021-08-20 MED ORDER — DIPHENHYDRAMINE HCL 50 MG/ML IJ SOLN: 50.0000 mg | Freq: Once | INTRAMUSCULAR | Status: DC | PRN

## 2021-08-20 MED ORDER — SODIUM CHLORIDE 0.9 % IV SOLN: Freq: Once | INTRAVENOUS | Status: DC | PRN

## 2021-08-20 MED ORDER — CYANOCOBALAMIN 1000 MCG/ML IJ SOLN
1000.0000 ug | Freq: Once | INTRAMUSCULAR | Status: AC
  Administered 2021-08-20: 1000 ug via INTRAMUSCULAR
  Filled 2021-08-20: qty 1

## 2021-08-20 NOTE — Progress Notes (Signed)
Hematology and Oncology Follow Up Visit  James Zuniga [id number] 1956/04/07 66 y.o. 08/20/2021   Principle Diagnosis:  Iron deficiency anemia Hypotestosteronemia Chronic liver lesions Pernicious anemia   Of Note: History of stroke in June 2018, no more Testosterone or ESA's   Current Therapy:        IV iron as indicated Vit B12 1 mg IM q month    Interim History:  James Zuniga is here today for follow-up and injection. He is doing well but notes some fatigue and SOB with exertion.  He has occasional angina and palpitations.  No fever, chills, n/v, cough, rash, dizziness, abdominal pain or changes in bowel or bladder habits.  No swelling, tenderness in his extremities.  Numbness and tingling in his hands and feet comes and goes.  No falls or syncope to report.  He ambulates with a cane for added support.  His appetite comes and goes. He admits that he needs to better hydrate throughout the day. His weight is 236 lbs.   ECOG Performance Status: 1 - Symptomatic but completely ambulatory  Medications:  Allergies as of 08/20/2021   No Known Allergies      Medication List        Accurate as of August 20, 2021  2:31 PM. If you have any questions, ask your nurse or doctor.          albuterol 108 (90 Base) MCG/ACT inhaler Commonly known as: VENTOLIN HFA Inhale 2 puffs into the lungs every 6 (six) hours as needed for wheezing or shortness of breath.   allopurinol 300 MG tablet Commonly known as: ZYLOPRIM Take 300 mg by mouth daily.   amLODipine 5 MG tablet Commonly known as: NORVASC Take 5 mg by mouth daily.   Anoro Ellipta 62.5-25 MCG/ACT Aepb Generic drug: umeclidinium-vilanterol Inhale 1 puff into the lungs daily.   atorvastatin 80 MG tablet Commonly known as: LIPITOR Take 1 tablet (80 mg total) by mouth every morning.   atorvastatin 80 MG tablet Commonly known as: LIPITOR Take 1 tablet by mouth daily.   clopidogrel 75 MG tablet Commonly known as:  PLAVIX Take 1 tablet (75 mg total) by mouth daily.   colchicine 0.6 MG tablet Take 0.6 mg by mouth.   diclofenac sodium 1 % Gel Commonly known as: VOLTAREN Use 4 grams to knees tid prn   diclofenac Sodium 1 % Gel Commonly known as: VOLTAREN SMARTSIG:4 Gram(s) Topical 3 Times Daily PRN   DULoxetine 60 MG capsule Commonly known as: CYMBALTA Take 60 mg by mouth 2 (two) times daily.   Fifty50 Glucose Meter 2.0 w/Device Kit To check blood sugars TID or Use as instructed Include strips. Lancets, lancet device, control solution. batteries   Fifty50 Pen Needles 32G X 4 MM Misc Generic drug: Insulin Pen Needle 1 each by Misc.(Non-Drug; Combo Route) route daily.   furosemide 20 MG tablet Commonly known as: LASIX Take 20 mg by mouth every Tuesday, Thursday, and Saturday at 6 PM.   gabapentin 300 MG capsule Commonly known as: NEURONTIN Take 300 mg by mouth 4 (four) times daily.   glucose blood test strip 1 each by Misc.(Non-Drug; Combo Route) route daily.   insulin NPH-regular Human (70-30) 100 UNIT/ML injection Commonly known as: NovoLIN 70/30 Please inject 25 units twice daily before breakfast and dinner. What changed:  how much to take how to take this when to take this additional instructions   insulin regular 100 units/mL injection Commonly known as: NOVOLIN R Use as sliding scale  up to 10 units tid with meals   ipratropium-albuterol 0.5-2.5 (3) MG/3ML Soln Commonly known as: DUONEB USE 1 VIAL IN NEBULIZER 4 TIMES DAILY   losartan 100 MG tablet Commonly known as: COZAAR Take 100 mg by mouth daily.   meclizine 12.5 MG tablet Commonly known as: ANTIVERT Take 1 tablet (12.5 mg total) by mouth 3 (three) times daily as needed for dizziness.   methocarbamol 500 MG tablet Commonly known as: ROBAXIN Take 500 mg by mouth 3 (three) times daily.   metoprolol 200 MG 24 hr tablet Commonly known as: TOPROL-XL Take 200 mg by mouth daily.   onetouch ultrasoft  lancets USE 1 LANCET TO CHECK GLUCOSE IN THE MORNING BEFORE BREAKFAST   pantoprazole 20 MG tablet Commonly known as: PROTONIX Take 20 mg by mouth daily.   potassium chloride SA 20 MEQ tablet Commonly known as: KLOR-CON M Take 1 tablet (20 mEq total) by mouth 2 (two) times daily. What changed:  when to take this additional instructions   primidone 50 MG tablet Commonly known as: MYSOLINE Take 100 mg by mouth at bedtime.   sildenafil 100 MG tablet Commonly known as: VIAGRA Take 1 tablet by mouth daily as needed.   spironolactone 25 MG tablet Commonly known as: ALDACTONE Take 25 mg by mouth daily.   tamsulosin 0.4 MG Caps capsule Commonly known as: FLOMAX Take 1 capsule (0.4 mg total) by mouth daily after breakfast.   tiZANidine 4 MG tablet Commonly known as: ZANAFLEX Take 4 mg by mouth 3 (three) times daily as needed for muscle spasms.   Toujeo SoloStar 300 UNIT/ML Solostar Pen Generic drug: insulin glargine (1 Unit Dial) Inject into the skin.   traMADol 50 MG tablet Commonly known as: ULTRAM Take 50 mg by mouth daily as needed for moderate pain.   Tyler Aas FlexTouch 200 UNIT/ML FlexTouch Pen Generic drug: insulin degludec 63 Units. 60 units once daily per patient.   VITAMIN B-12 IJ Inject 1,000 mg as directed every 30 (thirty) days. X 4   zolpidem 12.5 MG CR tablet Commonly known as: AMBIEN CR Take 12.5 mg by mouth at bedtime as needed.        Allergies: No Known Allergies  Past Medical History, Surgical history, Social history, and Family History were reviewed and updated.  Review of Systems: All other 10 point review of systems is negative.   Physical Exam:  weight is 236 lb (107 kg). His oral temperature is 98.4 F (36.9 C). His blood pressure is 85/57 (abnormal) and his pulse is 82. His respiration is 18 and oxygen saturation is 100%.   Wt Readings from Last 3 Encounters:  08/20/21 236 lb (107 kg)  07/23/21 235 lb (106.6 kg)  05/23/21 240 lb  (108.9 kg)    Ocular: Sclerae unicteric, pupils equal, round and reactive to light Ear-nose-throat: Oropharynx clear, dentition fair Lymphatic: No cervical or supraclavicular adenopathy Lungs no rales or rhonchi, good excursion bilaterally Heart regular rate and rhythm, no murmur appreciated Abd soft, nontender, positive bowel sounds MSK no focal spinal tenderness, no joint edema Neuro: non-focal, well-oriented, appropriate affect Breasts: Deferred   Lab Results  Component Value Date   WBC 9.1 08/20/2021   HGB 11.8 (L) 08/20/2021   HCT 37.5 (L) 08/20/2021   MCV 88.4 08/20/2021   PLT 317 08/20/2021   Lab Results  Component Value Date   FERRITIN 244 07/23/2021   IRON 52 07/23/2021   TIBC 330 07/23/2021   UIBC 278 07/23/2021   IRONPCTSAT 16 (L)  07/23/2021   Lab Results  Component Value Date   RETICCTPCT 2.0 08/20/2021   RBC 4.25 08/20/2021   RBC 4.24 08/20/2021   RETICCTABS 63.0 06/20/2015   No results found for: Nils Pyle [facility] Lab Results  Component Value Date   IGA 139 08/26/2017   No results found for: Kathrynn Ducking, MSPIKE, SPEI   Chemistry      Component Value Date/Time   NA 128 (L) 07/23/2021 1528   NA 141 09/25/2016 0809   K 4.6 07/23/2021 1528   K 2.8 (LL) 09/25/2016 0809   CL 97 (L) 07/23/2021 1528   CL 102 07/31/2016 0815   CO2 22 07/23/2021 1528   CO2 28 09/25/2016 0809   BUN 13 07/23/2021 1528   BUN 13.1 09/25/2016 0809   CREATININE 1.42 (H) 07/23/2021 1528   CREATININE 1.47 (H) 07/23/2021 1416   CREATININE 1.2 09/25/2016 0809      Component Value Date/Time   CALCIUM 10.4 (H) 07/23/2021 1528   CALCIUM 9.5 09/25/2016 0809   ALKPHOS 114 07/23/2021 1528   ALKPHOS 126 09/25/2016 0809   AST 25 07/23/2021 1528   AST 16 07/23/2021 1416   AST 19 09/25/2016 0809   ALT 24 07/23/2021 1528   ALT 21 07/23/2021 1416   ALT 23 09/25/2016 0809   BILITOT 0.5 07/23/2021 1528   BILITOT 0.4  07/23/2021 1416   BILITOT 0.45 09/25/2016 0809       Impression and Plan: James Zuniga is a very pleasant 66 yo African American gentleman with multifactorial anemia including chronic iron deficiency.  B 12 injection given.  Iron studies are pending.  B 12 injection monthly and follow-up in 3 months.   Lottie Dawson, NP 2/8/20232:31 PM

## 2021-08-20 NOTE — Patient Instructions (Signed)
Vitamin B12 Injection °What is this medication? °Vitamin B12 (VAHY tuh min B12) prevents and treats low vitamin B12 levels in your body. It is used in people who do not get enough vitamin B12 from their diet or when their digestive tract does not absorb enough. Vitamin B12 plays an important role in maintaining the health of your nervous system and red blood cells. °This medicine may be used for other purposes; ask your health care provider or pharmacist if you have questions. °COMMON BRAND NAME(S): B-12 Compliance Kit, B-12 Injection Kit, Cyomin, Dodex, LA-12, Nutri-Twelve, Physicians EZ Use B-12, Primabalt °What should I tell my care team before I take this medication? °They need to know if you have any of these conditions: °Kidney disease °Leber's disease °Megaloblastic anemia °An unusual or allergic reaction to cyanocobalamin, cobalt, other medications, foods, dyes, or preservatives °Pregnant or trying to get pregnant °Breast-feeding °How should I use this medication? °This medication is injected into a muscle or deeply under the skin. It is usually given in a clinic or care team's office. However, your care team may teach you how to inject yourself. Follow all instructions. °Talk to your care team about the use of this medication in children. Special care may be needed. °Overdosage: If you think you have taken too much of this medicine contact a poison control center or emergency room at once. °NOTE: This medicine is only for you. Do not share this medicine with others. °What if I miss a dose? °If you are given your dose at a clinic or care team's office, call to reschedule your appointment. If you give your own injections, and you miss a dose, take it as soon as you can. If it is almost time for your next dose, take only that dose. Do not take double or extra doses. °What may interact with this medication? °Colchicine °Heavy alcohol intake °This list may not describe all possible interactions. Give your health  care provider a list of all the medicines, herbs, non-prescription drugs, or dietary supplements you use. Also tell them if you smoke, drink alcohol, or use illegal drugs. Some items may interact with your medicine. °What should I watch for while using this medication? °Visit your care team regularly. You may need blood work done while you are taking this medication. °You may need to follow a special diet. Talk to your care team. Limit your alcohol intake and avoid smoking to get the best benefit. °What side effects may I notice from receiving this medication? °Side effects that you should report to your care team as soon as possible: °Allergic reactions--skin rash, itching, hives, swelling of the face, lips, tongue, or throat °Swelling of the ankles, hands, or feet °Trouble breathing °Side effects that usually do not require medical attention (report to your care team if they continue or are bothersome): °Diarrhea °This list may not describe all possible side effects. Call your doctor for medical advice about side effects. You may report side effects to FDA at 1-800-FDA-1088. °Where should I keep my medication? °Keep out of the reach of children. °Store at room temperature between 15 and 30 degrees C (59 and 85 degrees F). Protect from light. Throw away any unused medication after the expiration date. °NOTE: This sheet is a summary. It may not cover all possible information. If you have questions about this medicine, talk to your doctor, pharmacist, or health care provider. °© 2022 Elsevier/Gold Standard (2020-09-11 00:00:00) ° °

## 2021-08-21 LAB — IRON AND IRON BINDING CAPACITY (CC-WL,HP ONLY)
Iron: 37 ug/dL — ABNORMAL LOW (ref 45–182)
Saturation Ratios: 11 % — ABNORMAL LOW (ref 17.9–39.5)
TIBC: 325 ug/dL (ref 250–450)
UIBC: 288 ug/dL (ref 117–376)

## 2021-08-21 LAB — FERRITIN: Ferritin: 250 ng/mL (ref 24–336)

## 2021-08-22 ENCOUNTER — Ambulatory Visit: Payer: Medicare Other | Admitting: Family

## 2021-08-22 ENCOUNTER — Telehealth: Payer: Self-pay | Admitting: Pulmonary Disease

## 2021-08-22 ENCOUNTER — Inpatient Hospital Stay: Payer: Medicare Other

## 2021-08-22 ENCOUNTER — Other Ambulatory Visit: Payer: Medicare Other

## 2021-08-22 DIAGNOSIS — G4733 Obstructive sleep apnea (adult) (pediatric): Secondary | ICD-10-CM

## 2021-08-22 NOTE — Telephone Encounter (Signed)
C pap supply order placed and detailed message left on phone. Nothing further needed at this time.

## 2021-08-25 ENCOUNTER — Telehealth: Payer: Self-pay

## 2021-08-25 NOTE — Telephone Encounter (Signed)
Received fax from [facility] asking for copy of sleep study and insurance information. Faxed Sleep study and insurance info to [phone] [place]: [employer]. Nothing further needed.

## 2021-08-26 ENCOUNTER — Telehealth: Payer: Self-pay | Admitting: Family

## 2021-08-26 NOTE — Telephone Encounter (Signed)
Called to schedule 2 doses per 2/14 sch msg , no answer and unable to leave voicemail

## 2021-08-27 ENCOUNTER — Telehealth: Payer: Self-pay | Admitting: *Deleted

## 2021-08-27 ENCOUNTER — Telehealth: Payer: Self-pay | Admitting: Family

## 2021-08-27 NOTE — Telephone Encounter (Signed)
Called to schedule 2 doses of iron IV , no answer and unable to leave voicemail

## 2021-08-27 NOTE — Telephone Encounter (Signed)
Per 08/20/21 los - called and was unable to lvm of upcoming appointments - mailed calendar

## 2021-08-29 ENCOUNTER — Telehealth: Payer: Self-pay | Admitting: Family

## 2021-08-29 NOTE — Telephone Encounter (Signed)
Called to schedule 2 doses of iron per 2/14 sch msg , 3rd attempt to contact patient

## 2021-09-01 ENCOUNTER — Other Ambulatory Visit: Payer: Self-pay

## 2021-09-01 ENCOUNTER — Inpatient Hospital Stay: Payer: Medicare Other

## 2021-09-01 VITALS — BP 110/66 | HR 65 | Temp 98.0°F | Resp 18

## 2021-09-01 DIAGNOSIS — D509 Iron deficiency anemia, unspecified: Secondary | ICD-10-CM

## 2021-09-01 DIAGNOSIS — D510 Vitamin B12 deficiency anemia due to intrinsic factor deficiency: Secondary | ICD-10-CM | POA: Diagnosis not present

## 2021-09-01 MED ORDER — SODIUM CHLORIDE 0.9 % IV SOLN: Freq: Once | INTRAVENOUS | Status: AC

## 2021-09-01 MED ORDER — SODIUM CHLORIDE 0.9 % IV SOLN
300.0000 mg | Freq: Once | INTRAVENOUS | Status: AC
  Administered 2021-09-01: 300 mg via INTRAVENOUS
  Filled 2021-09-01: qty 300

## 2021-09-01 NOTE — Patient Instructions (Signed)

## 2021-09-08 ENCOUNTER — Inpatient Hospital Stay: Payer: Medicare Other

## 2021-09-08 ENCOUNTER — Other Ambulatory Visit: Payer: Self-pay

## 2021-09-08 VITALS — BP 97/60 | HR 73 | Temp 98.3°F | Resp 20

## 2021-09-08 DIAGNOSIS — D510 Vitamin B12 deficiency anemia due to intrinsic factor deficiency: Secondary | ICD-10-CM | POA: Diagnosis not present

## 2021-09-08 DIAGNOSIS — D509 Iron deficiency anemia, unspecified: Secondary | ICD-10-CM

## 2021-09-08 MED ORDER — SODIUM CHLORIDE 0.9 % IV SOLN
300.0000 mg | Freq: Once | INTRAVENOUS | Status: AC
  Administered 2021-09-08: 300 mg via INTRAVENOUS
  Filled 2021-09-08: qty 300

## 2021-09-08 MED ORDER — SODIUM CHLORIDE 0.9 % IV SOLN: Freq: Once | INTRAVENOUS | Status: AC

## 2021-09-08 NOTE — Patient Instructions (Signed)

## 2021-09-15 ENCOUNTER — Inpatient Hospital Stay: Payer: Medicare Other | Attending: Family

## 2021-09-15 ENCOUNTER — Other Ambulatory Visit: Payer: Self-pay

## 2021-09-15 VITALS — BP 103/65 | HR 81 | Temp 98.4°F | Resp 18

## 2021-09-15 DIAGNOSIS — D509 Iron deficiency anemia, unspecified: Secondary | ICD-10-CM

## 2021-09-15 DIAGNOSIS — D510 Vitamin B12 deficiency anemia due to intrinsic factor deficiency: Secondary | ICD-10-CM | POA: Diagnosis present

## 2021-09-15 MED ORDER — CYANOCOBALAMIN 1000 MCG/ML IJ SOLN
1000.0000 ug | Freq: Once | INTRAMUSCULAR | Status: AC
  Administered 2021-09-15: 1000 ug via INTRAMUSCULAR
  Filled 2021-09-15: qty 1

## 2021-09-15 NOTE — Patient Instructions (Signed)
Vitamin B12 Deficiency Vitamin B12 deficiency means that your body does not have enough vitamin B12. The body needs this important vitamin: To make red blood cells. To make genes (DNA). To help the nerves work. If you do not have enough vitamin B12 in your body, you can have health problems, such as not having enough red blood cells in the blood (anemia). What are the causes? Not eating enough foods that contain vitamin B12. Not being able to take in (absorb) vitamin B12 from the food that you eat. Certain diseases. A condition in which the body does not make enough of a certain protein. This results in your body not taking in enough vitamin B12. Having a surgery in which part of the stomach or small intestine is taken out. Taking medicines that make it hard for the body to take in vitamin B12. These include: Heartburn medicines. Some medicines that are used to treat diabetes. What increases the risk? Being an older adult. Eating a vegetarian or vegan diet that does not include any foods that come from animals. Not eating enough foods that contain vitamin B12 while you are pregnant. Taking certain medicines. Having alcoholism. What are the signs or symptoms? In some cases, there are no symptoms. If the condition leads to too few blood cells or nerve damage, symptoms can occur, such as: Feeling weak or tired. Not being hungry. Losing feeling (numbness) or tingling in your hands and feet. Redness and burning of the tongue. Feeling sad (depressed). Confusion or memory problems. Trouble walking. If anemia is very bad, symptoms can include: Being short of breath. Being dizzy. Having a very fast heartbeat. How is this treated? Changing the way you eat and drink, such as: Eating more foods that contain vitamin B12. Drinking little or no alcohol. Getting vitamin B12 shots. Taking vitamin B12 supplements by mouth (orally). Your doctor will tell you the dose that is best for you. Follow  these instructions at home: Eating and drinking  Eat foods that come from animals and have a lot of vitamin B12 in them. These include: Meats and poultry. This includes beef, pork, chicken, turkey, and organ meats, such as liver. Seafood, such as clams, rainbow trout, salmon, tuna, and haddock. Eggs. Dairy foods such as milk, yogurt, and cheese. Eat breakfast cereals that have vitamin B12 added to them (are fortified). Check the label. The items listed above may not be a complete list of foods and beverages you can eat and drink. Contact a dietitian for more information. Alcohol use Do not drink alcohol if: Your doctor tells you not to drink. You are pregnant, may be pregnant, or are planning to become pregnant. If you drink alcohol: Limit how much you have to: 0-1 drink a day for women. 0-2 drinks a day for men. Know how much alcohol is in your drink. In the [place] one drink equals one 12 oz bottle of beer (355 mL), one 5 oz glass of wine (148 mL), or one 1 oz glass of hard liquor (44 mL). General instructions Get any vitamin B12 shots if told by your doctor. Take supplements only as told by your doctor. Follow the directions. Keep all follow-up visits. Contact a doctor if: Your symptoms come back. Your symptoms get worse or do not get better with treatment. Get help right away if: You have trouble breathing. You have a very fast heartbeat. You have chest pain. You get dizzy. You faint. These symptoms may be an emergency. Get help right away. Call 911.   Do not wait to see if the symptoms will go away. Do not drive yourself to the hospital. Summary Vitamin B12 deficiency means that your body is not getting enough of the vitamin. In some cases, there are no symptoms of this condition. Treatment may include making a change in the way you eat and drink, getting shots, or taking supplements. Eat foods that have vitamin B12 in them. This information is not intended to replace advice  given to you by your health care provider. Make sure you discuss any questions you have with your health care provider. Document Revised: 02/21/2021 Document Reviewed: 02/21/2021 Elsevier Patient Education  2022 Elsevier Inc.  

## 2021-09-17 ENCOUNTER — Ambulatory Visit: Payer: Medicare Other

## 2021-10-20 ENCOUNTER — Inpatient Hospital Stay: Payer: Medicare Other | Attending: Family

## 2021-10-20 VITALS — BP 80/50 | HR 70 | Temp 98.8°F

## 2021-10-20 DIAGNOSIS — D510 Vitamin B12 deficiency anemia due to intrinsic factor deficiency: Secondary | ICD-10-CM | POA: Diagnosis present

## 2021-10-20 DIAGNOSIS — D509 Iron deficiency anemia, unspecified: Secondary | ICD-10-CM

## 2021-10-20 MED ORDER — CYANOCOBALAMIN 1000 MCG/ML IJ SOLN
1000.0000 ug | Freq: Once | INTRAMUSCULAR | Status: AC
  Administered 2021-10-20: 1000 ug via INTRAMUSCULAR
  Filled 2021-10-20: qty 1

## 2021-10-20 NOTE — Patient Instructions (Signed)
Vitamin B12 and Folate Test ?Why am I having this test? ?Vitamin S93 and folate (folic acid) are B vitamins needed to make red blood cells and keep your nervous system healthy. Vitamin B12 is in foods such as meats, eggs, dairy products, and fish. Folate is in fruits, beans, and leafy green vegetables. Some foods, such as whole grains, bread, and cereals have vitamin B12 added to them (are fortified). ?You may not have enough of these B vitamins (have a deficiency) if your diet lacks these vitamins. Low levels can also be caused by diseases or having had surgeries on your stomach or small intestine that interfere with your ability to absorb the vitamins from your food. ?You may have a vitamin B12 and folate test if: ?You have symptoms of vitamin B12 or folate deficiency, such as tiredness (fatigue), headache, confusion, poor balance, or tingling and numbness in your hands and feet. ?You are pregnant or breastfeeding. Women who are pregnant or breastfeeding need more folate and may need to take dietary supplements. ?Your red blood cell count is low (anemia). ?You are an older person and have mental confusion. ?You have a disease or condition that may lead to a deficiency of these B vitamins. ?What is being tested? ?This test measures the amount of vitamin B12 and folate in your blood. The tests for vitamin B12 and folate may be done together or separately. ?What kind of sample is taken? ?A blood sample is required for this test. It is usually collected by inserting a needle into a blood vessel. ?How do I prepare for this test? ?Follow instructions from your health care provider about eating and drinking before the test. ?Tell a health care provider about: ?All medicines you are taking, including vitamins, herbs, eye drops, creams, and over-the-counter medicines. ?Any medical conditions you have. ?Whether you are pregnant or may be pregnant. ?How often you drink alcohol. ?How are the results reported? ?Your test  results will be reported as values that identify the amount of vitamin B12 and folate in your blood. Your health care provider will compare your results to normal ranges that were established after testing a large group of people (reference ranges). Reference ranges may vary among labs and hospitals. For this test, common reference ranges are: ?Vitamin B12: 160-950 pg/mL or 118-701 pmol/L (SI units). ?Folate: 5-25 ng/mL or 11-57 nmol/L (SI units). ?What do the results mean? ?Results within the reference range are considered normal. Vitamin B12 or folate levels that are lower than the reference range may be caused by: ?Poor nutrition or eating a vegetarian or vegan diet that does not include any foods that come from animals. ?Having alcoholism. ?Having certain diseases that make it hard to absorb vitamin B12. These diseases include Crohn's disease, chronic pancreatitis, and cystic fibrosis. ?Taking certain medicines. ?Having had surgeries on your stomach or small intestine. ?High levels of vitamin B12 are rare, but they may happen if you have: ?Cancer. ?Liver disease. ?High levels of folate may happen if: ?You have anemia. ?You are vegetarian. ?You have had a recent blood transfusion. ?Talk with your health care provider about what your results mean. ?Questions to ask your health care provider ?Ask your health care provider, or the department that is doing the test: ?When will my results be ready? ?How will I get my results? ?What are my treatment options? ?What other tests do I need? ?What are my next steps? ?Summary ?Vitamin T34 and folate (folic acid) are both B vitamins that are needed to make  red blood cells and to keep your nervous system healthy. ?You may not have enough B vitamins in your body if you do not get enough in your diet or if you have a disease that makes it hard to absorb vitamin B12. ?This test measures the amount of vitamin B12 and folate in your blood. A blood sample is required for the  test. ?Talk with your health care provider about what your results mean. ?This information is not intended to replace advice given to you by your health care provider. Make sure you discuss any questions you have with your health care provider. ?Document Revised: 02/21/2021 Document Reviewed: 02/21/2021 ?Elsevier Patient Education ? [place]. ? ?

## 2021-10-20 NOTE — Progress Notes (Signed)
BP was low today. Given 2 soft drinks to drink. Patient given a read out of BP's. He will contact his primary care physician with results. ?

## 2021-10-28 ENCOUNTER — Encounter: Payer: Self-pay | Admitting: Pulmonary Disease

## 2021-10-28 ENCOUNTER — Ambulatory Visit: Payer: Medicare Other | Admitting: Pulmonary Disease

## 2021-10-28 VITALS — BP 138/78 | HR 78 | Temp 98.4°F | Ht 66.0 in | Wt 236.0 lb

## 2021-10-28 DIAGNOSIS — G4733 Obstructive sleep apnea (adult) (pediatric): Secondary | ICD-10-CM | POA: Diagnosis not present

## 2021-10-28 DIAGNOSIS — G4730 Sleep apnea, unspecified: Secondary | ICD-10-CM | POA: Diagnosis not present

## 2021-10-28 DIAGNOSIS — J438 Other emphysema: Secondary | ICD-10-CM

## 2021-10-28 DIAGNOSIS — E669 Obesity, unspecified: Secondary | ICD-10-CM

## 2021-10-28 DIAGNOSIS — J4540 Moderate persistent asthma, uncomplicated: Secondary | ICD-10-CM | POA: Diagnosis not present

## 2021-10-28 MED ORDER — FLUTICASONE PROPIONATE 50 MCG/ACT NA SUSP: 1.0000 | Freq: Every day | NASAL | 2 refills | Status: DC

## 2021-10-28 MED ORDER — FLUTICASONE FUROATE-VILANTEROL 200-25 MCG/ACT IN AEPB: 1.0000 | INHALATION_SPRAY | Freq: Every day | RESPIRATORY_TRACT | 5 refills | Status: DC

## 2021-10-28 MED ORDER — FLUTICASONE FUROATE-VILANTEROL 200-25 MCG/ACT IN AEPB
1.0000 | INHALATION_SPRAY | Freq: Every day | RESPIRATORY_TRACT | 5 refills | Status: DC
Start: 2021-10-28 — End: 2023-02-23

## 2021-10-28 NOTE — Patient Instructions (Signed)
Breo one puff daily, and rinse your mouth after each use ? ?Flonase 1 spray in each nostril nightly ? ?Will have [facility] refit you to a full face CPAP mask ? ?Follow up in 6 months ?

## 2021-10-28 NOTE — Progress Notes (Signed)
? ?[facility] ? ?Chief Complaint  ?Patient presents with  ? Follow-up  ?  SOB and productive cough increased   ? ? ?Constitutional:  ?BP 138/78 (BP Location: Right Arm, Patient Position: Sitting, Cuff Size: Large)   Pulse 78   Temp 98.4 ?F (36.9 ?C) (Oral)   Ht _0  (1.676 m)   Wt 236 lb (107 kg)   SpO2 92%   BMI 38.09 kg/m?  ? ?Past Medical History:  ?CVA, ETOH, OA, DM, GERD, CHF, HLD, Gout, HTN, Low T, Vit D deficiency, Iron deficiency anemia ? ?Past Surgical History:  ?He  has a past surgical history that includes Knee arthroscopy (Left); TEE without cardioversion (N/A, 12/24/2016); Loop recorder implant (06/2017); Esophagogastroduodenoscopy (N/A, 08/26/2017); Colonoscopy (N/A, 08/26/2017); enteroscopy (N/A, 05/24/2018); Hot hemostasis (N/A, 05/24/2018); and polypectomy (05/24/2018). ? ?Brief Summary:  ?James Zuniga is a 66 y.o. male former smoker with obstructive sleep apnea and emphysema. ?  ? ? ? ?Subjective:  ? ?He is here with his wife. ? ?He has more sinus congestion and cough.  Brings up clear sputum and gets "wheezaly".  Ran out of anoro, and didn't know he was supposed to refill this.  Uses albuterol and this helps.   ? ?Uses CPAP.  Pressure okay.  Wants a full face mask. ? ?Physical Exam:  ? ?Appearance - well kempt, uses a cane ? ?ENMT - no sinus tenderness, no oral exudate, no LAN, Mallampati 3 airway, no stridor ? ?Respiratory - equal breath sounds bilaterally, no wheezing or rales ? ?CV - s1s2 regular rate and rhythm, no murmurs ? ?Ext - no clubbing, no edema ? ?Skin - no rashes ? ?Psych - normal mood and affect ? ? ?  ?Pulmonary testing:  ?PFT 01/27/13 >> FEV1 2.50 (84%), FEV1% 83, TLC 4.06 (61%), DLCO 60%, no BD ?PFT 04/02/21 >> FEV1 2.19 (78%), FEV1% 77, TLC 5.34 (80%), DLCO 76% ? ?Chest Imaging:  ?CT chest 03/02/13 >> mild paraseptal emphysema, 4 mm RUL nodule ?CT chest 07/26/15 >> stable RUL nodule ? ?Sleep Tests:  ?PSG 04/21/19 >> AHI 15.5, SpO2 low  83% ?CPAP titration 05/17/19 >> CPAP 11 cm H2O ?CPAP 09/28/21 to 10/27/21 >> used on 25 of 30 nights with average 4 hrs 26 min.  Average AHI 1.4 with CPAP 11 cm H2O ? ?Cardiac Tests:  ?Echo 03/03/21 >> EF 45 to 50%, mild LVH ? ?Social History:  ?He  reports that he quit smoking about 4 years ago. His smoking use included cigarettes. He started smoking about 38 years ago. He has a 20.00 pack-year smoking history. He has never used smokeless tobacco. He reports that he does not drink alcohol and does not use drugs. ? ?Family History:  ?His family history includes Diabetes in his father and sister; Heart attack in his brother and brother; Heart disease in his mother; Stroke in his brother and paternal uncle. ?  ? ? ?Assessment/Plan:  ? ?Obstructive sleep apnea. ?- he is compliant with CPAP and reports benefit ?- he uses [facility] for his DME ?- continue CPAP 11 cm H2O ?- will send order to change him to full face mask ?  ?COPD with emphysema and asthma. ?- he has financial assistance program for meds ?- will change to breo 200 one puff daily ?- continue prn albuterol ? ?Allergic rhinitis. ?- add flonase  ?  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 ?- followed by Dr. Otho Perl with cardiology at [facility] ?  ? ?  Time Spent Involved in Patient Care on Day of Examination:  ?37 minutes ? ?Follow up:  ? ?Patient Instructions  ?Breo one puff daily, and rinse your mouth after each use ? ?Flonase 1 spray in each nostril nightly ? ?Will have [facility] refit you to a full face CPAP mask ? ?Follow up in 6 months ? ?Medication List:  ? ?Allergies as of 10/28/2021   ?No Known Allergies ?  ? ?  ?Medication List  ?  ? ?  ? Accurate as of October 28, 2021  3:03 PM. If you have any questions, ask your nurse or doctor.  ?  ?  ? ?  ? ?STOP taking these medications   ? ?Anoro Ellipta 62.5-25 MCG/ACT Aepb ?Generic drug: umeclidinium-vilanterol ?Stopped by: Chesley Mires, MD ?  ? ?  ? ?TAKE these medications   ? ?albuterol 108 (90  Base) MCG/ACT inhaler ?Commonly known as: VENTOLIN HFA ?Inhale 2 puffs into the lungs every 6 (six) hours as needed for wheezing or shortness of breath. ?  ?allopurinol 300 MG tablet ?Commonly known as: ZYLOPRIM ?Take 300 mg by mouth daily. ?  ?amLODipine 5 MG tablet ?Commonly known as: NORVASC ?Take 5 mg by mouth daily. ?  ?atorvastatin 80 MG tablet ?Commonly known as: LIPITOR ?Take 1 tablet (80 mg total) by mouth every morning. ?  ?atorvastatin 80 MG tablet ?Commonly known as: LIPITOR ?Take 1 tablet by mouth daily. ?  ?clopidogrel 75 MG tablet ?Commonly known as: PLAVIX ?Take 1 tablet (75 mg total) by mouth daily. ?  ?colchicine 0.6 MG tablet ?Take 0.6 mg by mouth. ?  ?diclofenac sodium 1 % Gel ?Commonly known as: VOLTAREN ?Use 4 grams to knees tid prn ?  ?diclofenac Sodium 1 % Gel ?Commonly known as: VOLTAREN ?SMARTSIG:4 Gram(s) Topical 3 Times Daily PRN ?  ?DULoxetine 60 MG capsule ?Commonly known as: CYMBALTA ?Take 60 mg by mouth 2 (two) times daily. ?  ?Fifty50 Glucose Meter 2.0 w/Device Kit ?To check blood sugars TID or Use as instructed Include strips. Lancets, lancet device, control solution. batteries ?  ?Fifty50 Pen Needles 32G X 4 MM Misc ?Generic drug: Insulin Pen Needle ?1 each by Misc.(Non-Drug; Combo Route) route daily. ?  ?fluticasone 50 MCG/ACT nasal spray ?Commonly known as: FLONASE ?Place 1 spray into both nostrils daily. ?Started by: Chesley Mires, MD ?  ?fluticasone furoate-vilanterol 200-25 MCG/ACT Aepb ?Commonly known as: Breo Ellipta ?Inhale 1 puff into the lungs daily. ?Started by: Chesley Mires, MD ?  ?furosemide 20 MG tablet ?Commonly known as: LASIX ?Take 20 mg by mouth every Tuesday, Thursday, and Saturday at 6 PM. ?  ?gabapentin 300 MG capsule ?Commonly known as: NEURONTIN ?Take 300 mg by mouth 4 (four) times daily. ?  ?glucose blood test strip ?1 each by Misc.(Non-Drug; Combo Route) route daily. ?  ?insulin NPH-regular Human (70-30) 100 UNIT/ML injection ?Commonly known as: NovoLIN  70/30 ?Please inject 25 units twice daily before breakfast and dinner. ?What changed:  ?how much to take ?how to take this ?when to take this ?additional instructions ?  ?insulin regular 100 units/mL injection ?Commonly known as: NOVOLIN R ?Use as sliding scale up to 10 units tid with meals ?  ?ipratropium-albuterol 0.5-2.5 (3) MG/3ML Soln ?Commonly known as: DUONEB ?USE 1 VIAL IN NEBULIZER 4 TIMES DAILY ?  ?losartan 100 MG tablet ?Commonly known as: COZAAR ?Take 100 mg by mouth daily. ?  ?meclizine 12.5 MG tablet ?Commonly known as: ANTIVERT ?Take 1 tablet (12.5 mg total) by mouth 3 (three)  times daily as needed for dizziness. ?  ?methocarbamol 500 MG tablet ?Commonly known as: ROBAXIN ?Take 500 mg by mouth 3 (three) times daily. ?  ?metoprolol 200 MG 24 hr tablet ?Commonly known as: TOPROL-XL ?Take 200 mg by mouth daily. ?  ?onetouch ultrasoft lancets ?USE 1 LANCET TO CHECK GLUCOSE IN THE MORNING BEFORE BREAKFAST ?  ?pantoprazole 20 MG tablet ?Commonly known as: PROTONIX ?Take 20 mg by mouth daily. ?  ?potassium chloride SA 20 MEQ tablet ?Commonly known as: KLOR-CON M ?Take 1 tablet (20 mEq total) by mouth 2 (two) times daily. ?What changed:  ?when to take this ?additional instructions ?  ?primidone 50 MG tablet ?Commonly known as: MYSOLINE ?Take 100 mg by mouth at bedtime. ?  ?sildenafil 100 MG tablet ?Commonly known as: VIAGRA ?Take 1 tablet by mouth daily as needed. ?  ?spironolactone 25 MG tablet ?Commonly known as: ALDACTONE ?Take 25 mg by mouth daily. ?  ?tamsulosin 0.4 MG Caps capsule ?Commonly known as: FLOMAX ?Take 1 capsule (0.4 mg total) by mouth daily after breakfast. ?  ?tiZANidine 4 MG tablet ?Commonly known as: ZANAFLEX ?Take 4 mg by mouth 3 (three) times daily as needed for muscle spasms. ?  ?Toujeo SoloStar 300 UNIT/ML Solostar Pen ?Generic drug: insulin glargine (1 Unit Dial) ?Inject into the skin. ?  ?traMADol 50 MG tablet ?Commonly known as: ULTRAM ?Take 50 mg by mouth daily as needed for  moderate pain. ?  ?Tyler Aas FlexTouch 200 UNIT/ML FlexTouch Pen ?Generic drug: insulin degludec ?63 Units. 60 units once daily per patient. ?  ?VITAMIN B-12 IJ ?Inject 1,000 mg as directed every 30 (thirty) days. X 4 ?  ?zo

## 2021-10-30 ENCOUNTER — Encounter: Payer: Self-pay | Admitting: Hematology & Oncology

## 2021-10-30 ENCOUNTER — Other Ambulatory Visit (HOSPITAL_COMMUNITY): Payer: Self-pay

## 2021-11-17 ENCOUNTER — Ambulatory Visit: Payer: Medicare Other | Admitting: Family

## 2021-11-17 ENCOUNTER — Other Ambulatory Visit: Payer: Medicare Other

## 2021-11-17 ENCOUNTER — Ambulatory Visit: Payer: Medicare Other

## 2021-11-25 ENCOUNTER — Encounter: Payer: Self-pay | Admitting: Family

## 2021-11-25 ENCOUNTER — Inpatient Hospital Stay: Payer: Medicare Other

## 2021-11-25 ENCOUNTER — Inpatient Hospital Stay: Payer: Medicare Other | Attending: Family

## 2021-11-25 ENCOUNTER — Inpatient Hospital Stay (HOSPITAL_BASED_OUTPATIENT_CLINIC_OR_DEPARTMENT_OTHER): Payer: Medicare Other | Admitting: Family

## 2021-11-25 VITALS — BP 109/69 | HR 72 | Temp 98.2°F | Resp 17 | Wt 232.0 lb

## 2021-11-25 DIAGNOSIS — D510 Vitamin B12 deficiency anemia due to intrinsic factor deficiency: Secondary | ICD-10-CM | POA: Insufficient documentation

## 2021-11-25 DIAGNOSIS — D500 Iron deficiency anemia secondary to blood loss (chronic): Secondary | ICD-10-CM | POA: Diagnosis not present

## 2021-11-25 DIAGNOSIS — D509 Iron deficiency anemia, unspecified: Secondary | ICD-10-CM

## 2021-11-25 DIAGNOSIS — D631 Anemia in chronic kidney disease: Secondary | ICD-10-CM

## 2021-11-25 LAB — CMP (CANCER CENTER ONLY)
ALT: 25 U/L (ref 0–44)
AST: 20 U/L (ref 15–41)
Albumin: 3.7 g/dL (ref 3.5–5.0)
Alkaline Phosphatase: 102 U/L (ref 38–126)
Anion gap: 8 (ref 5–15)
BUN: 15 mg/dL (ref 8–23)
CO2: 24 mmol/L (ref 22–32)
Calcium: 10.1 mg/dL (ref 8.9–10.3)
Chloride: 103 mmol/L (ref 98–111)
Creatinine: 1.5 mg/dL — ABNORMAL HIGH (ref 0.61–1.24)
GFR, Estimated: 51 mL/min — ABNORMAL LOW (ref >60)
Glucose, Bld: 119 mg/dL — ABNORMAL HIGH (ref 70–99)
Potassium: 4.3 mmol/L (ref 3.5–5.1)
Sodium: 135 mmol/L (ref 135–145)
Total Bilirubin: 0.4 mg/dL (ref 0.3–1.2)
Total Protein: 7.8 g/dL (ref 6.5–8.1)

## 2021-11-25 LAB — CBC WITH DIFFERENTIAL (CANCER CENTER ONLY)
Abs Immature Granulocytes: 0.05 10*3/uL (ref 0.00–0.07)
Basophils Absolute: 0 10*3/uL (ref 0.0–0.1)
Basophils Relative: 0 %
Eosinophils Absolute: 0 10*3/uL (ref 0.0–0.5)
Eosinophils Relative: 0 %
HCT: 39.2 % (ref 39.0–52.0)
Hemoglobin: 12.1 g/dL — ABNORMAL LOW (ref 13.0–17.0)
Immature Granulocytes: 0 %
Lymphocytes Relative: 9 %
Lymphs Abs: 1.5 10*3/uL (ref 0.7–4.0)
MCH: 28.1 pg (ref 26.0–34.0)
MCHC: 30.9 g/dL (ref 30.0–36.0)
MCV: 91 fL (ref 80.0–100.0)
Monocytes Absolute: 0.8 10*3/uL (ref 0.1–1.0)
Monocytes Relative: 5 %
Neutro Abs: 13.4 10*3/uL — ABNORMAL HIGH (ref 1.7–7.7)
Neutrophils Relative %: 86 %
Platelet Count: 285 10*3/uL (ref 150–400)
RBC: 4.31 MIL/uL (ref 4.22–5.81)
RDW: 15.4 % (ref 11.5–15.5)
WBC Count: 15.7 10*3/uL — ABNORMAL HIGH (ref 4.0–10.5)
nRBC: 0 % (ref 0.0–0.2)

## 2021-11-25 LAB — RETICULOCYTES
Immature Retic Fract: 21.4 % — ABNORMAL HIGH (ref 2.3–15.9)
RBC.: 4.32 MIL/uL (ref 4.22–5.81)
Retic Count, Absolute: 100.2 10*3/uL (ref 19.0–186.0)
Retic Ct Pct: 2.3 % (ref 0.4–3.1)

## 2021-11-25 LAB — FERRITIN: Ferritin: 206 ng/mL (ref 24–336)

## 2021-11-25 MED ORDER — CYANOCOBALAMIN 1000 MCG/ML IJ SOLN
1000.0000 ug | Freq: Once | INTRAMUSCULAR | Status: AC
  Administered 2021-11-25: 1000 ug via INTRAMUSCULAR
  Filled 2021-11-25: qty 1

## 2021-11-25 NOTE — Progress Notes (Signed)
?Hematology and Oncology Follow Up Visit ? ?James Zuniga ?[id number] ?[birth year] 66 y.o. ?11/25/2021 ? ? ?Principle Diagnosis:  ?Iron deficiency anemia ?Hypotestosteronemia ?Chronic liver lesions ?Pernicious anemia ?  ?Of Note: History of stroke in June 2018, no more Testosterone or ESA's ?  ?Current Therapy:        ?IV iron as indicated ?Vit B12 1 mg IM q month  ?  ?Interim History:  James Zuniga is here today with his wife for follow-up. He is notes fatigue.  ?He has had some SOB with exertion.  ?No obvious blood loss noted. No abnormal bruising, no petechiae.  ?He had a cough recently and his wife states that he had a few syncopal episodes due to trying to hold in his cough. Thankfully he is feeling much better and has not had any more issues.  ?No fever, chills, n/v, rash, dizziness, chest pain, palpitations, abdominal pain or changes in bowel or bladder habits.  ?No swelling, tenderness, numbness or tingling in his extremities at this time.  ?He has been taking his diuretic daily which has helped reduce his fluid retention.  ?His appetite is good and he is doing his best to stay well hydrated. His weight is stable at 232 lbs.  ? ?ECOG Performance Status: 1 - Symptomatic but completely ambulatory ? ?Medications:  ?Allergies as of 11/25/2021   ?No Known Allergies ?  ? ?  ?Medication List  ?  ? ?  ? Accurate as of Nov 25, 2021  2:56 PM. If you have any questions, ask your nurse or doctor.  ?  ?  ? ?  ? ?albuterol 108 (90 Base) MCG/ACT inhaler ?Commonly known as: VENTOLIN HFA ?Inhale 2 puffs into the lungs every 6 (six) hours as needed for wheezing or shortness of breath. ?  ?allopurinol 300 MG tablet ?Commonly known as: ZYLOPRIM ?Take 300 mg by mouth daily. ?  ?amLODipine 5 MG tablet ?Commonly known as: NORVASC ?Take 5 mg by mouth daily. ?  ?atorvastatin 80 MG tablet ?Commonly known as: LIPITOR ?Take 1 tablet (80 mg total) by mouth every morning. ?  ?atorvastatin 80 MG tablet ?Commonly known as: LIPITOR ?Take 1 tablet  by mouth daily. ?  ?clopidogrel 75 MG tablet ?Commonly known as: PLAVIX ?Take 1 tablet (75 mg total) by mouth daily. ?  ?colchicine 0.6 MG tablet ?Take 0.6 mg by mouth. ?  ?diclofenac sodium 1 % Gel ?Commonly known as: VOLTAREN ?Use 4 grams to knees tid prn ?  ?diclofenac Sodium 1 % Gel ?Commonly known as: VOLTAREN ?SMARTSIG:4 Gram(s) Topical 3 Times Daily PRN ?  ?DULoxetine 60 MG capsule ?Commonly known as: CYMBALTA ?Take 60 mg by mouth 2 (two) times daily. ?  ?Fifty50 Glucose Meter 2.0 w/Device Kit ?To check blood sugars TID or Use as instructed Include strips. Lancets, lancet device, control solution. batteries ?  ?Fifty50 Pen Needles 32G X 4 MM Misc ?Generic drug: Insulin Pen Needle ?1 each by Misc.(Non-Drug; Combo Route) route daily. ?  ?fluticasone 50 MCG/ACT nasal spray ?Commonly known as: FLONASE ?Place 1 spray into both nostrils daily. ?  ?fluticasone furoate-vilanterol 200-25 MCG/ACT Aepb ?Commonly known as: Breo Ellipta ?Inhale 1 puff into the lungs daily. ?  ?furosemide 20 MG tablet ?Commonly known as: LASIX ?Take 20 mg by mouth every Tuesday, Thursday, and Saturday at 6 PM. ?  ?gabapentin 300 MG capsule ?Commonly known as: NEURONTIN ?Take 300 mg by mouth 4 (four) times daily. ?  ?glucose blood test strip ?1 each by Misc.(Non-Drug; Combo Route) route  daily. ?  ?insulin NPH-regular Human (70-30) 100 UNIT/ML injection ?Commonly known as: NovoLIN 70/30 ?Please inject 25 units twice daily before breakfast and dinner. ?What changed:  ?how much to take ?how to take this ?when to take this ?additional instructions ?  ?insulin regular 100 units/mL injection ?Commonly known as: NOVOLIN R ?Use as sliding scale up to 10 units tid with meals ?  ?ipratropium-albuterol 0.5-2.5 (3) MG/3ML Soln ?Commonly known as: DUONEB ?USE 1 VIAL IN NEBULIZER 4 TIMES DAILY ?  ?losartan 100 MG tablet ?Commonly known as: COZAAR ?Take 100 mg by mouth daily. ?  ?meclizine 12.5 MG tablet ?Commonly known as: ANTIVERT ?Take 1 tablet (12.5  mg total) by mouth 3 (three) times daily as needed for dizziness. ?  ?methocarbamol 500 MG tablet ?Commonly known as: ROBAXIN ?Take 500 mg by mouth 3 (three) times daily. ?  ?metoprolol 200 MG 24 hr tablet ?Commonly known as: TOPROL-XL ?Take 200 mg by mouth daily. ?  ?onetouch ultrasoft lancets ?USE 1 LANCET TO CHECK GLUCOSE IN THE MORNING BEFORE BREAKFAST ?  ?pantoprazole 20 MG tablet ?Commonly known as: PROTONIX ?Take 20 mg by mouth daily. ?  ?potassium chloride SA 20 MEQ tablet ?Commonly known as: KLOR-CON M ?Take 1 tablet (20 mEq total) by mouth 2 (two) times daily. ?What changed:  ?when to take this ?additional instructions ?  ?primidone 50 MG tablet ?Commonly known as: MYSOLINE ?Take 100 mg by mouth at bedtime. ?  ?sildenafil 100 MG tablet ?Commonly known as: VIAGRA ?Take 1 tablet by mouth daily as needed. ?  ?spironolactone 25 MG tablet ?Commonly known as: ALDACTONE ?Take 25 mg by mouth daily. ?  ?tamsulosin 0.4 MG Caps capsule ?Commonly known as: FLOMAX ?Take 1 capsule (0.4 mg total) by mouth daily after breakfast. ?  ?tiZANidine 4 MG tablet ?Commonly known as: ZANAFLEX ?Take 4 mg by mouth 3 (three) times daily as needed for muscle spasms. ?  ?Toujeo SoloStar 300 UNIT/ML Solostar Pen ?Generic drug: insulin glargine (1 Unit Dial) ?Inject into the skin. ?  ?traMADol 50 MG tablet ?Commonly known as: ULTRAM ?Take 50 mg by mouth daily as needed for moderate pain. ?  ?Tyler Aas FlexTouch 200 UNIT/ML FlexTouch Pen ?Generic drug: insulin degludec ?63 Units. 60 units once daily per patient. ?  ?VITAMIN B-12 IJ ?Inject 1,000 mg as directed every 30 (thirty) days. X 4 ?  ?zolpidem 12.5 MG CR tablet ?Commonly known as: AMBIEN CR ?Take 12.5 mg by mouth at bedtime as needed. ?  ? ?  ? ? ?Allergies: No Known Allergies ? ?Past Medical History, Surgical history, Social history, and Family History were reviewed and updated. ? ?Review of Systems: ?All other 10 point review of systems is negative.  ? ?Physical Exam: ? weight is  232 lb (105.2 kg). His oral temperature is 98.2 ?F (36.8 ?C). His blood pressure is 109/69 and his pulse is 72. His respiration is 17 and oxygen saturation is 100%.  ? ?Wt Readings from Last 3 Encounters:  ?11/25/21 232 lb (105.2 kg)  ?10/28/21 236 lb (107 kg)  ?08/20/21 236 lb (107 kg)  ? ? ?Ocular: Sclerae unicteric, pupils equal, round and reactive to light ?Ear-nose-throat: Oropharynx clear, dentition fair ?Lymphatic: No cervical or supraclavicular adenopathy ?Lungs no rales or rhonchi, good excursion bilaterally ?Heart regular rate and rhythm, no murmur appreciated ?Abd soft, nontender, positive bowel sounds ?MSK no focal spinal tenderness, no joint edema ?Neuro: non-focal, well-oriented, appropriate affect ?Breasts:  ? ?Lab Results  ?Component Value Date  ? WBC 15.7 (  H) 11/25/2021  ? HGB 12.1 (L) 11/25/2021  ? HCT 39.2 11/25/2021  ? MCV 91.0 11/25/2021  ? PLT 285 11/25/2021  ? ?Lab Results  ?Component Value Date  ? FERRITIN 250 08/20/2021  ? IRON 37 (L) 08/20/2021  ? TIBC 325 08/20/2021  ? UIBC 288 08/20/2021  ? IRONPCTSAT 11 (L) 08/20/2021  ? ?Lab Results  ?Component Value Date  ? RETICCTPCT 2.3 11/25/2021  ? RBC 4.32 11/25/2021  ? RETICCTABS 63.0 06/20/2015  ? ?No results found for: KPAFRELGTCHN, LAMBDASER, KAPLAMBRATIO ?Lab Results  ?Component Value Date  ? IGA 139 08/26/2017  ? ?No results found for: TOTALPROTELP, ALBUMINELP, A1GS, A2GS, BETS, BETA2SER, GAMS, MSPIKE, SPEI ?  Chemistry   ?   ?Component Value Date/Time  ? NA 134 (L) 08/20/2021 1409  ? NA 141 09/25/2016 0809  ? K 4.5 08/20/2021 1409  ? K 2.8 (LL) 09/25/2016 0809  ? CL 100 08/20/2021 1409  ? CL 102 07/31/2016 0815  ? CO2 27 08/20/2021 1409  ? CO2 28 09/25/2016 0809  ? BUN 15 08/20/2021 1409  ? BUN 13.1 09/25/2016 0809  ? CREATININE 1.51 (H) 08/20/2021 1409  ? CREATININE 1.2 09/25/2016 0809  ?    ?Component Value Date/Time  ? CALCIUM 10.6 (H) 08/20/2021 1409  ? CALCIUM 9.5 09/25/2016 0809  ? ALKPHOS 110 08/20/2021 1409  ? ALKPHOS 126 09/25/2016  0809  ? AST 15 08/20/2021 1409  ? AST 19 09/25/2016 0809  ? ALT 20 08/20/2021 1409  ? ALT 23 09/25/2016 0809  ? BILITOT 0.4 08/20/2021 1409  ? BILITOT 0.45 09/25/2016 0809  ?  ? ? ? ?Impression and Plan

## 2021-11-25 NOTE — Patient Instructions (Signed)
Vitamin B12 Deficiency Vitamin B12 deficiency means that your body does not have enough vitamin B12. The body needs this important vitamin: To make red blood cells. To make genes (DNA). To help the nerves work. If you do not have enough vitamin B12 in your body, you can have health problems, such as not having enough red blood cells in the blood (anemia). What are the causes? Not eating enough foods that contain vitamin B12. Not being able to take in (absorb) vitamin B12 from the food that you eat. Certain diseases. A condition in which the body does not make enough of a certain protein. This results in your body not taking in enough vitamin B12. Having a surgery in which part of the stomach or small intestine is taken out. Taking medicines that make it hard for the body to take in vitamin B12. These include: Heartburn medicines. Some medicines that are used to treat diabetes. What increases the risk? Being an older adult. Eating a vegetarian or vegan diet that does not include any foods that come from animals. Not eating enough foods that contain vitamin B12 while you are pregnant. Taking certain medicines. Having alcoholism. What are the signs or symptoms? In some cases, there are no symptoms. If the condition leads to too few blood cells or nerve damage, symptoms can occur, such as: Feeling weak or tired. Not being hungry. Losing feeling (numbness) or tingling in your hands and feet. Redness and burning of the tongue. Feeling sad (depressed). Confusion or memory problems. Trouble walking. If anemia is very bad, symptoms can include: Being short of breath. Being dizzy. Having a very fast heartbeat. How is this treated? Changing the way you eat and drink, such as: Eating more foods that contain vitamin B12. Drinking little or no alcohol. Getting vitamin B12 shots. Taking vitamin B12 supplements by mouth (orally). Your doctor will tell you the dose that is best for you. Follow  these instructions at home: Eating and drinking  Eat foods that come from animals and have a lot of vitamin B12 in them. These include: Meats and poultry. This includes beef, pork, chicken, turkey, and organ meats, such as liver. Seafood, such as clams, rainbow trout, salmon, tuna, and haddock. Eggs. Dairy foods such as milk, yogurt, and cheese. Eat breakfast cereals that have vitamin B12 added to them (are fortified). Check the label. The items listed above may not be a complete list of foods and beverages you can eat and drink. Contact a dietitian for more information. Alcohol use Do not drink alcohol if: Your doctor tells you not to drink. You are pregnant, may be pregnant, or are planning to become pregnant. If you drink alcohol: Limit how much you have to: 0-1 drink a day for women. 0-2 drinks a day for men. Know how much alcohol is in your drink. In the [place] one drink equals one 12 oz bottle of beer (355 mL), one 5 oz glass of wine (148 mL), or one 1 oz glass of hard liquor (44 mL). General instructions Get any vitamin B12 shots if told by your doctor. Take supplements only as told by your doctor. Follow the directions. Keep all follow-up visits. Contact a doctor if: Your symptoms come back. Your symptoms get worse or do not get better with treatment. Get help right away if: You have trouble breathing. You have a very fast heartbeat. You have chest pain. You get dizzy. You faint. These symptoms may be an emergency. Get help right away. Call 911.   Do not wait to see if the symptoms will go away. Do not drive yourself to the hospital. Summary Vitamin B12 deficiency means that your body is not getting enough of the vitamin. In some cases, there are no symptoms of this condition. Treatment may include making a change in the way you eat and drink, getting shots, or taking supplements. Eat foods that have vitamin B12 in them. This information is not intended to replace advice  given to you by your health care provider. Make sure you discuss any questions you have with your health care provider. Document Revised: 02/21/2021 Document Reviewed: 02/21/2021 Elsevier Patient Education  2023 Else[employer] Inc.  

## 2021-11-26 LAB — IRON AND IRON BINDING CAPACITY (CC-WL,HP ONLY)
Iron: 69 ug/dL (ref 45–182)
Saturation Ratios: 20 % (ref 17.9–39.5)
TIBC: 346 ug/dL (ref 250–450)
UIBC: 277 ug/dL (ref 117–376)

## 2021-11-27 ENCOUNTER — Telehealth: Payer: Self-pay | Admitting: *Deleted

## 2021-11-27 NOTE — Telephone Encounter (Signed)
Per 11/25/21 los - called and gave upcoming appointments - confirmed

## 2021-12-25 ENCOUNTER — Inpatient Hospital Stay: Payer: Medicare Other | Attending: Family

## 2021-12-25 VITALS — BP 115/70 | HR 80 | Temp 98.4°F | Resp 18

## 2021-12-25 DIAGNOSIS — D509 Iron deficiency anemia, unspecified: Secondary | ICD-10-CM

## 2021-12-25 DIAGNOSIS — D510 Vitamin B12 deficiency anemia due to intrinsic factor deficiency: Secondary | ICD-10-CM | POA: Insufficient documentation

## 2021-12-25 MED ORDER — CYANOCOBALAMIN 1000 MCG/ML IJ SOLN
1000.0000 ug | Freq: Once | INTRAMUSCULAR | Status: AC
  Administered 2021-12-25: 1000 ug via INTRAMUSCULAR
  Filled 2021-12-25: qty 1

## 2021-12-25 NOTE — Patient Instructions (Signed)
Vitamin B12 Injection What is this medication? Vitamin B12 (VAHY tuh min B12) prevents and treats low vitamin B12 levels in your body. It is used in people who do not get enough vitamin B12 from their diet or when their digestive tract does not absorb enough. Vitamin B12 plays an important role in maintaining the health of your nervous system and red blood cells. This medicine may be used for other purposes; ask your health care provider or pharmacist if you have questions. COMMON BRAND NAME(S): B-12 Compliance Kit, B-12 Injection Kit, Cyomin, Dodex, LA-12, Nutri-Twelve, Physicians EZ Use B-12, Primabalt What should I tell my care team before I take this medication? They need to know if you have any of these conditions: Kidney disease Leber's disease Megaloblastic anemia An unusual or allergic reaction to cyanocobalamin, cobalt, other medications, foods, dyes, or preservatives Pregnant or trying to get pregnant Breast-feeding How should I use this medication? This medication is injected into a muscle or deeply under the skin. It is usually given in a clinic or care team's office. However, your care team may teach you how to inject yourself. Follow all instructions. Talk to your care team about the use of this medication in children. Special care may be needed. Overdosage: If you think you have taken too much of this medicine contact a poison control center or emergency room at once. NOTE: This medicine is only for you. Do not share this medicine with others. What if I miss a dose? If you are given your dose at a clinic or care team's office, call to reschedule your appointment. If you give your own injections, and you miss a dose, take it as soon as you can. If it is almost time for your next dose, take only that dose. Do not take double or extra doses. What may interact with this medication? Alcohol Colchicine This list may not describe all possible interactions. Give your health care  provider a list of all the medicines, herbs, non-prescription drugs, or dietary supplements you use. Also tell them if you smoke, drink alcohol, or use illegal drugs. Some items may interact with your medicine. What should I watch for while using this medication? Visit your care team regularly. You may need blood work done while you are taking this medication. You may need to follow a special diet. Talk to your care team. Limit your alcohol intake and avoid smoking to get the best benefit. What side effects may I notice from receiving this medication? Side effects that you should report to your care team as soon as possible: Allergic reactions--skin rash, itching, hives, swelling of the face, lips, tongue, or throat Swelling of the ankles, hands, or feet Trouble breathing Side effects that usually do not require medical attention (report to your care team if they continue or are bothersome): Diarrhea This list may not describe all possible side effects. Call your doctor for medical advice about side effects. You may report side effects to FDA at 1-800-FDA-1088. Where should I keep my medication? Keep out of the reach of children. Store at room temperature between 15 and 30 degrees C (59 and 85 degrees F). Protect from light. Throw away any unused medication after the expiration date. NOTE: This sheet is a summary. It may not cover all possible information. If you have questions about this medicine, talk to your doctor, pharmacist, or health care provider.  2023 Elsevier/Gold Standard (2021-03-11 00:00:00)

## 2021-12-26 ENCOUNTER — Ambulatory Visit: Payer: Medicare Other

## 2021-12-31 ENCOUNTER — Other Ambulatory Visit: Payer: Self-pay

## 2021-12-31 DIAGNOSIS — D510 Vitamin B12 deficiency anemia due to intrinsic factor deficiency: Secondary | ICD-10-CM

## 2022-01-26 ENCOUNTER — Inpatient Hospital Stay: Payer: Medicare Other | Attending: Family

## 2022-01-26 VITALS — BP 121/69 | HR 82 | Temp 98.6°F | Resp 17

## 2022-01-26 DIAGNOSIS — D510 Vitamin B12 deficiency anemia due to intrinsic factor deficiency: Secondary | ICD-10-CM | POA: Diagnosis not present

## 2022-01-26 DIAGNOSIS — D509 Iron deficiency anemia, unspecified: Secondary | ICD-10-CM

## 2022-01-26 MED ORDER — CYANOCOBALAMIN 1000 MCG/ML IJ SOLN
1000.0000 ug | Freq: Once | INTRAMUSCULAR | Status: AC
  Administered 2022-01-26: 1000 ug via INTRAMUSCULAR
  Filled 2022-01-26: qty 1

## 2022-01-26 NOTE — Patient Instructions (Signed)

## 2022-01-28 ENCOUNTER — Telehealth: Payer: Self-pay | Admitting: *Deleted

## 2022-01-28 NOTE — Telephone Encounter (Signed)
Received a call from [facility] at [place] stating that they received a referral for patient but that they do not take [facility] Medicare.  Called Murray Hodgkins, Patients wife and [facility] with this information.

## 2022-01-28 NOTE — Telephone Encounter (Signed)
Referral to [facility] - faxed records to [facility] [phone]

## 2022-02-25 ENCOUNTER — Inpatient Hospital Stay: Payer: Medicare Other | Admitting: Family

## 2022-02-25 ENCOUNTER — Inpatient Hospital Stay: Payer: Medicare Other | Attending: Family

## 2022-02-25 ENCOUNTER — Inpatient Hospital Stay: Payer: Medicare Other

## 2022-07-17 ENCOUNTER — Inpatient Hospital Stay: Payer: Medicare Other | Attending: Hematology | Admitting: Hematology

## 2022-07-17 ENCOUNTER — Inpatient Hospital Stay: Payer: Medicare Other

## 2022-07-17 ENCOUNTER — Encounter: Payer: Self-pay | Admitting: Hematology

## 2022-07-17 DIAGNOSIS — Z87891 Personal history of nicotine dependence: Secondary | ICD-10-CM

## 2022-07-17 DIAGNOSIS — D500 Iron deficiency anemia secondary to blood loss (chronic): Secondary | ICD-10-CM

## 2022-07-17 DIAGNOSIS — I69954 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: Secondary | ICD-10-CM | POA: Insufficient documentation

## 2022-07-17 DIAGNOSIS — N189 Chronic kidney disease, unspecified: Secondary | ICD-10-CM

## 2022-07-17 DIAGNOSIS — Z809 Family history of malignant neoplasm, unspecified: Secondary | ICD-10-CM | POA: Diagnosis not present

## 2022-07-17 LAB — CBC WITH DIFFERENTIAL/PLATELET
Abs Immature Granulocytes: 0.04 10*3/uL (ref 0.00–0.07)
Basophils Absolute: 0.1 10*3/uL (ref 0.0–0.1)
Basophils Relative: 1 %
Eosinophils Absolute: 0.2 10*3/uL (ref 0.0–0.5)
Eosinophils Relative: 2 %
HCT: 27.8 % — ABNORMAL LOW (ref 39.0–52.0)
Hemoglobin: 7.7 g/dL — ABNORMAL LOW (ref 13.0–17.0)
Immature Granulocytes: 1 %
Lymphocytes Relative: 24 %
Lymphs Abs: 1.8 10*3/uL (ref 0.7–4.0)
MCH: 24.1 pg — ABNORMAL LOW (ref 26.0–34.0)
MCHC: 27.7 g/dL — ABNORMAL LOW (ref 30.0–36.0)
MCV: 87.1 fL (ref 80.0–100.0)
Monocytes Absolute: 0.6 10*3/uL (ref 0.1–1.0)
Monocytes Relative: 8 %
Neutro Abs: 5 10*3/uL (ref 1.7–7.7)
Neutrophils Relative %: 64 %
Platelets: 353 10*3/uL (ref 150–400)
RBC: 3.19 MIL/uL — ABNORMAL LOW (ref 4.22–5.81)
RDW: 17.2 % — ABNORMAL HIGH (ref 11.5–15.5)
WBC: 7.6 10*3/uL (ref 4.0–10.5)
nRBC: 0 % (ref 0.0–0.2)

## 2022-07-17 LAB — COMPREHENSIVE METABOLIC PANEL
ALT: 15 U/L (ref 0–44)
AST: 18 U/L (ref 15–41)
Albumin: 3.3 g/dL — ABNORMAL LOW (ref 3.5–5.0)
Alkaline Phosphatase: 90 U/L (ref 38–126)
Anion gap: 6 (ref 5–15)
BUN: 12 mg/dL (ref 8–23)
CO2: 25 mmol/L (ref 22–32)
Calcium: 9.8 mg/dL (ref 8.9–10.3)
Chloride: 104 mmol/L (ref 98–111)
Creatinine, Ser: 1.34 mg/dL — ABNORMAL HIGH (ref 0.61–1.24)
GFR, Estimated: 58 mL/min — ABNORMAL LOW (ref >60)
Glucose, Bld: 146 mg/dL — ABNORMAL HIGH (ref 70–99)
Potassium: 4.1 mmol/L (ref 3.5–5.1)
Sodium: 135 mmol/L (ref 135–145)
Total Bilirubin: 0.3 mg/dL (ref 0.3–1.2)
Total Protein: 7.7 g/dL (ref 6.5–8.1)

## 2022-07-17 LAB — IRON AND TIBC
Iron: 21 ug/dL — ABNORMAL LOW (ref 45–182)
Saturation Ratios: 5 % — ABNORMAL LOW (ref 17.9–39.5)
TIBC: 418 ug/dL (ref 250–450)
UIBC: 397 ug/dL

## 2022-07-17 LAB — RETICULOCYTES
Immature Retic Fract: 30.4 % — ABNORMAL HIGH (ref 2.3–15.9)
RBC.: 3.18 MIL/uL — ABNORMAL LOW (ref 4.22–5.81)
Retic Count, Absolute: 98.9 10*3/uL (ref 19.0–186.0)
Retic Ct Pct: 3.1 % (ref 0.4–3.1)

## 2022-07-17 LAB — LACTATE DEHYDROGENASE: LDH: 85 U/L — ABNORMAL LOW (ref 98–192)

## 2022-07-17 LAB — FERRITIN: Ferritin: 9 ng/mL — ABNORMAL LOW (ref 24–336)

## 2022-07-17 LAB — VITAMIN B12: Vitamin B-12: 898 pg/mL (ref 180–914)

## 2022-07-17 NOTE — Progress Notes (Signed)
CONSULT NOTE  Patient Care Team: Berkley Harvey, NP as PCP - General (Nurse Practitioner) Teena Irani, MD (Inactive) as Consulting Physician (Gastroenterology) Derek Jack, MD as Medical Oncologist (Hematology)  CHIEF COMPLAINTS/PURPOSE OF CONSULTATION:  Normocytic anemia  HISTORY OF PRESENTING ILLNESS:  James Zuniga 67 y.o. male is seen at the request of Eldridge Abrahams, NP for evaluation of normocytic anemia.  Patient previously seen at [facility] in [facility] by Lottie Dawson.  He did not follow-up with her since May 2023.  He recently moved to [place].  Denies any bleeding per rectum or melena.  Complains of feeling tired.  He is not on oral iron therapy.  Denies any ice pica.  No prior history of blood transfusions.  Reportedly seen by his PMD and labs on 05/25/2022 showed hemoglobin 10.6.  Last Venofer was on 09/08/2021.  He reportedly received B12 injection on the same day of 05/25/2022.  Lives at home with his wife.  He had couple of strokes with left hemiplegia.    MEDICAL HISTORY:  Past Medical History:  Diagnosis Date   Arthritis    "all over" (08/24/2017)   CHF (congestive heart failure) (HCC)    Chronic airway obstruction (HCC)    Chronic lower back pain    Coronary artery disease    Disorder of liver    "spots on my liver; they've rechecked them; they haven't changed" (08/24/2017)   Esophageal reflux    Gout    "on daily RX" (08/24/2017)   Hyperlipidemia    Hypertension    Hypokalemia 11/2016   Hypotestosteronism 07/01/2013   Insomnia    Iron deficiency anemia, unspecified 07/01/2013   Memory loss    On home oxygen therapy    "2L prn" (08/24/2017)   OSA on CPAP    PVD (peripheral vascular disease) ([place])    Renal disorder    Stroke ([place]) 01/2017   "left sided weakness remains" (08/24/2017)   Type II diabetes mellitus ([place])    Vitamin D deficiency     SURGICAL HISTORY: Past Surgical History:  Procedure Laterality Date   COLONOSCOPY N/A 08/26/2017    Procedure: COLONOSCOPY;  Surgeon: Gatha Mayer, MD;  Location: [facility];  Service: Endoscopy;  Laterality: N/A;   ENTEROSCOPY N/A 05/24/2018   Procedure: ENTEROSCOPY;  Surgeon: Lavena Bullion, DO;  Location: [facility] ENDOSCOPY;  Service: Gastroenterology;  Laterality: N/A;   ESOPHAGOGASTRODUODENOSCOPY N/A 08/26/2017   Procedure: ESOPHAGOGASTRODUODENOSCOPY (EGD);  Surgeon: Gatha Mayer, MD;  Location: [facility] ENDOSCOPY;  Service: Endoscopy;  Laterality: N/A;   HOT HEMOSTASIS N/A 05/24/2018   Procedure: HOT HEMOSTASIS (ARGON PLASMA COAGULATION/BICAP);  Surgeon: Lavena Bullion, DO;  Location: [facility] ENDOSCOPY;  Service: Gastroenterology;  Laterality: N/A;   KNEE ARTHROSCOPY Left    LOOP RECORDER IMPLANT  06/2017   POLYPECTOMY  05/24/2018   Procedure: POLYPECTOMY;  Surgeon: Lavena Bullion, DO;  Location: [facility] ENDOSCOPY;  Service: Gastroenterology;;   TEE WITHOUT CARDIOVERSION N/A 12/24/2016   Procedure: TRANSESOPHAGEAL ECHOCARDIOGRAM (TEE);  Surgeon: Fay Records, MD;  Location: [facility] ENDOSCOPY;  Service: Cardiovascular;  Laterality: N/A;    SOCIAL HISTORY: Social History   Socioeconomic History   Marital status: Married    Spouse name: Not on file   Number of children: Not on file   Years of education: Not on file   Highest education level: Not on file  Occupational History   Occupation: diabled    Comment: as of early 68s  Tobacco Use   Smoking status: Former  Packs/day: 0.50    Years: 40.00    Total pack years: 20.00    Types: Cigarettes    Start date: 08/05/1983    Quit date: 08/15/2017    Years since quitting: 4.9   Smokeless tobacco: Never  Vaping Use   Vaping Use: Never used  Substance and Sexual Activity   Alcohol use: No    Alcohol/week: 0.0 standard drinks of alcohol   Drug use: No   Sexual activity: Not Currently  Other Topics Concern   Not on file  Social History Narrative   Not on file   Social Determinants of Health   Financial Resource Strain: Not on  file  Food Insecurity: No Food Insecurity (07/17/2022)   Hunger Vital Sign    Worried About Running Out of Food in the Last Year: Never true    Ran Out of Food in the Last Year: Never true  Transportation Needs: No Transportation Needs (07/17/2022)   PRAPARE - Hydrologist (Medical): No    Lack of Transportation (Non-Medical): No  Physical Activity: Not on file  Stress: Not on file  Social Connections: Not on file  Intimate Partner Violence: Not At Risk (07/17/2022)   Humiliation, Afraid, Rape, and Kick questionnaire    Fear of Current or Ex-Partner: No    Emotionally Abused: No    Physically Abused: No    Sexually Abused: No    FAMILY HISTORY: Family History  Problem Relation Age of Onset   Heart disease Mother    Diabetes Father    Stroke Brother    Stroke Paternal Uncle    Diabetes Sister    Heart attack Brother    Heart attack Brother     ALLERGIES:  has No Known Allergies.  MEDICATIONS:  Current Outpatient Medications  Medication Sig Dispense Refill   albuterol (VENTOLIN HFA) 108 (90 Base) MCG/ACT inhaler Inhale 2 puffs into the lungs every 6 (six) hours as needed for wheezing or shortness of breath. 18 g 3   allopurinol (ZYLOPRIM) 300 MG tablet Take 300 mg by mouth daily.     amLODipine (NORVASC) 5 MG tablet Take 5 mg by mouth daily.      atorvastatin (LIPITOR) 80 MG tablet Take 1 tablet (80 mg total) by mouth every morning. 30 tablet 0   atorvastatin (LIPITOR) 80 MG tablet Take 1 tablet by mouth daily.     Blood Glucose Monitoring Suppl (FIFTY50 GLUCOSE METER 2.0) w/Device KIT To check blood sugars TID or Use as instructed Include strips. Lancets, lancet device, control solution. batteries     clopidogrel (PLAVIX) 75 MG tablet Take 1 tablet (75 mg total) by mouth daily. 30 tablet 2   colchicine 0.6 MG tablet Take 0.6 mg by mouth.     Cyanocobalamin (VITAMIN B-12 IJ) Inject 1,000 mg as directed every 30 (thirty) days. X 4     diclofenac sodium  (VOLTAREN) 1 % GEL      diclofenac Sodium (VOLTAREN) 1 % GEL SMARTSIG:4 Gram(s) Topical 3 Times Daily PRN     DULoxetine (CYMBALTA) 60 MG capsule Take 60 mg by mouth 2 (two) times daily.      fluticasone (FLONASE) 50 MCG/ACT nasal spray Place 1 spray into both nostrils daily. 16 g 2   fluticasone furoate-vilanterol (BREO ELLIPTA) 200-25 MCG/ACT AEPB Inhale 1 puff into the lungs daily. 1 each 5   furosemide (LASIX) 20 MG tablet Take 20 mg by mouth every Tuesday, Thursday, and Saturday at 6 PM.  gabapentin (NEURONTIN) 300 MG capsule Take 300 mg by mouth 4 (four) times daily.      glucose blood test strip 1 each by Misc.(Non-Drug; Combo Route) route daily.     Insulin Glargine (TOUJEO SOLOSTAR [place]) Inject 70 Units into the skin at bedtime.     insulin NPH-regular Human (NOVOLIN 70/30) (70-30) 100 UNIT/ML injection Please inject 25 units twice daily before breakfast and dinner. (Patient taking differently: Inject 25-30 Units into the skin See admin instructions. Inject 30 units with breakfast & 28 units with dinner.) 10 mL 3   Insulin Pen Needle (FIFTY50 PEN NEEDLES) 32G X 4 MM MISC 1 each by Misc.(Non-Drug; Combo Route) route daily.     insulin regular (NOVOLIN R) 100 units/mL injection Use as sliding scale up to 10 units tid with meals     ipratropium-albuterol (DUONEB) 0.5-2.5 (3) MG/3ML SOLN USE 1 VIAL IN NEBULIZER 4 TIMES DAILY 360 mL 5   Lancets (ONETOUCH ULTRASOFT) lancets USE 1 LANCET TO CHECK GLUCOSE IN THE MORNING BEFORE BREAKFAST  11   losartan (COZAAR) 100 MG tablet Take 100 mg by mouth daily.      meclizine (ANTIVERT) 12.5 MG tablet Take 1 tablet (12.5 mg total) by mouth 3 (three) times daily as needed for dizziness. 20 tablet 0   methocarbamol (ROBAXIN) 500 MG tablet Take 500 mg by mouth 3 (three) times daily.     metoprolol (TOPROL-XL) 200 MG 24 hr tablet Take 200 mg by mouth daily.     pantoprazole (PROTONIX) 20 MG tablet Take 20 mg by mouth daily.      potassium chloride SA  (K-DUR,KLOR-CON) 20 MEQ tablet Take 1 tablet (20 mEq total) by mouth 2 (two) times daily. (Patient taking differently: Take 20 mEq by mouth 3 (three) times daily. Take 1 tablet (20 meq) scheduled three times daily & takes an additional tablet at lunch on Tuesdays, Thursdays, & Saturdays due to lasix) 30 tablet 3   primidone (MYSOLINE) 50 MG tablet Take 100 mg by mouth at bedtime.      sildenafil (VIAGRA) 100 MG tablet Take 1 tablet by mouth daily as needed.     spironolactone (ALDACTONE) 25 MG tablet Take 25 mg by mouth daily.     tamsulosin (FLOMAX) 0.4 MG CAPS capsule Take 1 capsule (0.4 mg total) by mouth daily after breakfast. 30 capsule 12   tiZANidine (ZANAFLEX) 4 MG tablet Take 4 mg by mouth 3 (three) times daily as needed for muscle spasms.   0   traMADol (ULTRAM) 50 MG tablet Take 50 mg by mouth daily as needed for moderate pain.      zolpidem (AMBIEN CR) 12.5 MG CR tablet Take 12.5 mg by mouth at bedtime as needed.     No current facility-administered medications for this visit.    REVIEW OF SYSTEMS:   Constitutional: Denies fevers, chills or abnormal night sweats Eyes: Denies blurriness of vision, double vision or watery eyes Ears, nose, mouth, throat, and face: Denies mucositis or sore throat Respiratory: Positive for cough and shortness of breath. Cardiovascular: Positive for palpitations and chest pain. Gastrointestinal:  Denies nausea, heartburn or change in bowel habits Skin: Denies abnormal skin rashes Lymphatics: Denies new lymphadenopathy or easy bruising Neurological: Numbness in the fingers and feet. Behavioral/Psych: Mood is stable, no new changes  All other systems were reviewed with the patient and are negative.  PHYSICAL EXAMINATION: ECOG PERFORMANCE STATUS: 1 - Symptomatic but completely ambulatory  There were no vitals filed for this visit. There  were no vitals filed for this visit.  GENERAL:alert, no distress and comfortable SKIN: skin color, texture,  turgor are normal, no rashes or significant lesions EYES: normal, conjunctiva are pink and non-injected, sclera clear OROPHARYNX:no exudate, no erythema and lips, buccal mucosa, and tongue normal  NECK: supple, thyroid normal size, non-tender, without nodularity LYMPH:  no palpable lymphadenopathy in the cervical, axillary or inguinal LUNGS: clear to auscultation and percussion with normal breathing effort HEART: regular rate & rhythm and no murmurs and no lower extremity edema ABDOMEN:abdomen soft, non-tender and normal bowel sounds Musculoskeletal:no cyanosis of digits and no clubbing  PSYCH: alert & oriented x 3 with fluent speech NEURO: no focal motor/sensory deficits  LABORATORY DATA:  I have reviewed the data as listed Recent Results (from the past 2160 hour(s))  CBC with Differential     Status: Abnormal   Collection Time: 07/17/22  1:17 PM  Result Value Ref Range   WBC 7.6 4.0 - 10.5 K/uL   RBC 3.19 (L) 4.22 - 5.81 MIL/uL   Hemoglobin 7.7 (L) 13.0 - 17.0 g/dL   HCT 27.8 (L) 39.0 - 52.0 %   MCV 87.1 80.0 - 100.0 fL   MCH 24.1 (L) 26.0 - 34.0 pg   MCHC 27.7 (L) 30.0 - 36.0 g/dL   RDW 17.2 (H) 11.5 - 15.5 %   Platelets 353 150 - 400 K/uL   nRBC 0.0 0.0 - 0.2 %   Neutrophils Relative % 64 %   Neutro Abs 5.0 1.7 - 7.7 K/uL   Lymphocytes Relative 24 %   Lymphs Abs 1.8 0.7 - 4.0 K/uL   Monocytes Relative 8 %   Monocytes Absolute 0.6 0.1 - 1.0 K/uL   Eosinophils Relative 2 %   Eosinophils Absolute 0.2 0.0 - 0.5 K/uL   Basophils Relative 1 %   Basophils Absolute 0.1 0.0 - 0.1 K/uL   Immature Granulocytes 1 %   Abs Immature Granulocytes 0.04 0.00 - 0.07 K/uL    Comment: Performed at [facility], [address] [place], [place] [address]  Reticulocytes     Status: Abnormal   Collection Time: 07/17/22  1:18 PM  Result Value Ref Range   Retic Ct Pct 3.1 0.4 - 3.1 %   RBC. 3.18 (L) 4.22 - 5.81 MIL/uL   Retic Count, Absolute 98.9 19.0 - 186.0 K/uL   Immature Retic Fract 30.4  (H) 2.3 - 15.9 %    Comment: Performed at [facility], [address] [place],  [address]    RADIOGRAPHIC STUDIES: I have personally reviewed the radiological images as listed and agreed with the findings in the report. No results found.  ASSESSMENT:  1.  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 - Colonoscopy (08/26/2017) normal. - Small bowel endoscopy (05/24/2018): 1 gastric polyp, 1 nonbleeding angiectasia in duodenum treated with APC.  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  Normal duodenum, first second and third parts. - He was receiving intermittent Venofer infusions, last 1 on 09/08/2021. - He was also receiving B12 injections, last 1 on 05/25/2022.  2.  Social/family history: - Lives with wife at home.  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.  Quit smoking 5 months ago.  Smoked 1/3 pack/day for 50 years. - Multiple maternal aunts and uncles had cancers.  Patient does not know the types.  PLAN:  1.  Normocytic anemia: - We checked his  CBC today which showed hemoglobin of 7.7 with MCV of 87.  MCH and MCHC were low with elevated RDW indicative of iron deficiency. - We will schedule him for Feraheme infusion. - We will check for other coexisting nutritional deficiencies, and bone marrow infiltrative process. - RTC 6 to 8 weeks for follow-up with repeat labs.   All questions were answered. The patient knows to call the clinic with any problems, questions or concerns.      Derek Jack, MD 07/17/22 1:53 PM

## 2022-07-17 NOTE — Patient Instructions (Signed)
[facility]  Discharge Instructions  You were seen and examined today by Dr. Katragadda. Dr. Katragadda is a hematologist, meaning that he specializes in blood abnormalities. Dr. Katragadda discussed your past medical history, family history of cancers/blood conditions and the events that led to you being here today.  You were referred to Dr. Katragadda due to anemia.  Dr. Katragadda has recommended additional labs today for further evaluation.  Follow-up as scheduled.  Thank you for choosing [facility] to provide your oncology and hematology care.   To afford each patient quality time with our provider, please arrive at least 15 minutes before your scheduled appointment time. You may need to reschedule your appointment if you arrive late (10 or more minutes). Arriving late affects you and other patients whose appointments are after yours.  Also, if you miss three or more appointments without notifying the office, you may be dismissed from the clinic at the provider's discretion.    Again, thank you for choosing [facility].  Our hope is that these requests will decrease the amount of time that you wait before being seen by our physicians.   If you have a lab appointment with the Cancer Center please come in thru the Main Entrance and check in at the main information desk.           _____________________________________________________________  Should you have questions after your visit to [facility], please contact our office at [phone] and follow the prompts.  Our office hours are 8:00 a.m. to 4:30 p.m. Monday - Thursday and 8:00 a.m. to 2:30 p.m. Friday.  Please note that voicemails left after 4:00 p.m. may not be returned until the following business day.  We are closed weekends and all major holidays.  You do have access to a nurse 24-7, just call the main number to the clinic [phone] and do not  press any options, hold on the line and a nurse will answer the phone.    For prescription refill requests, have your pharmacy contact our office and allow 72 hours.    Masks are optional in the cancer centers. If you would like for your care team to wear a mask while they are taking care of you, please let them know. You may have one support person who is at least 68 years old accompany you for your appointments.  

## 2022-07-20 LAB — PROTEIN ELECTROPHORESIS, SERUM
A/G Ratio: 0.9 (ref 0.7–1.7)
Albumin ELP: 3.3 g/dL (ref 2.9–4.4)
Alpha-1-Globulin: 0.2 g/dL (ref 0.0–0.4)
Alpha-2-Globulin: 0.9 g/dL (ref 0.4–1.0)
Beta Globulin: 1 g/dL (ref 0.7–1.3)
Gamma Globulin: 1.5 g/dL (ref 0.4–1.8)
Globulin, Total: 3.7 g/dL (ref 2.2–3.9)
M-Spike, %: 1.1 g/dL — ABNORMAL HIGH
Total Protein ELP: 7 g/dL (ref 6.0–8.5)

## 2022-07-20 LAB — KAPPA/LAMBDA LIGHT CHAINS
Kappa free light chain: 28.6 mg/L — ABNORMAL HIGH (ref 3.3–19.4)
Kappa, lambda light chain ratio: 1.37 (ref 0.26–1.65)
Lambda free light chains: 20.9 mg/L (ref 5.7–26.3)

## 2022-07-21 LAB — METHYLMALONIC ACID, SERUM: Methylmalonic Acid, Quantitative: 237 nmol/L (ref 0–378)

## 2022-07-22 ENCOUNTER — Inpatient Hospital Stay: Payer: Medicare Other

## 2022-07-22 VITALS — BP 100/66 | HR 85 | Temp 97.8°F | Resp 18

## 2022-07-22 DIAGNOSIS — D509 Iron deficiency anemia, unspecified: Secondary | ICD-10-CM

## 2022-07-22 DIAGNOSIS — D500 Iron deficiency anemia secondary to blood loss (chronic): Secondary | ICD-10-CM | POA: Diagnosis not present

## 2022-07-22 LAB — COPPER, SERUM: Copper: 161 ug/dL — ABNORMAL HIGH (ref 69–132)

## 2022-07-22 MED ORDER — ACETAMINOPHEN 325 MG PO TABS
650.0000 mg | ORAL_TABLET | Freq: Once | ORAL | Status: AC
  Administered 2022-07-22: 650 mg via ORAL
  Filled 2022-07-22: qty 2

## 2022-07-22 MED ORDER — SODIUM CHLORIDE 0.9 % IV SOLN
1020.0000 mg | Freq: Once | INTRAVENOUS | Status: AC
  Administered 2022-07-22: 1020 mg via INTRAVENOUS
  Filled 2022-07-22: qty 34

## 2022-07-22 MED ORDER — LORATADINE 10 MG PO TABS
10.0000 mg | ORAL_TABLET | Freq: Once | ORAL | Status: AC
  Administered 2022-07-22: 10 mg via ORAL
  Filled 2022-07-22: qty 1

## 2022-07-22 MED ORDER — SODIUM CHLORIDE 0.9 % IV SOLN: Freq: Once | INTRAVENOUS | Status: AC

## 2022-07-22 NOTE — Progress Notes (Signed)
Patient tolerated iron infusion with no complaints voiced.  Peripheral IV site clean and dry with good blood return noted before and after infusion.  Band aid applied.  VSS with discharge and left in satisfactory condition with no s/s of distress noted.   

## 2022-07-22 NOTE — Patient Instructions (Signed)
[facility]  Discharge Instructions: Thank you for choosing [facility] to provide your oncology and hematology care.  If you have a lab appointment with the Cancer Center, please come in thru the Main Entrance and check in at the main information desk.  Wear comfortable clothing and clothing appropriate for easy access to any Portacath or PICC line.   We strive to give you quality time with your provider. You may need to reschedule your appointment if you arrive late (15 or more minutes).  Arriving late affects you and other patients whose appointments are after yours.  Also, if you miss three or more appointments without notifying the office, you may be dismissed from the clinic at the provider's discretion.      For prescription refill requests, have your pharmacy contact our office and allow 72 hours for refills to be completed.    Today you received the following Feraheme, return as scheduled.   To help prevent nausea and vomiting after your treatment, we encourage you to take your nausea medication as directed.  BELOW ARE SYMPTOMS THAT SHOULD BE REPORTED IMMEDIATELY: *FEVER GREATER THAN 100.4 F (38 C) OR HIGHER *CHILLS OR SWEATING *NAUSEA AND VOMITING THAT IS NOT CONTROLLED WITH YOUR NAUSEA MEDICATION *UNUSUAL SHORTNESS OF BREATH *UNUSUAL BRUISING OR BLEEDING *URINARY PROBLEMS (pain or burning when urinating, or frequent urination) *BOWEL PROBLEMS (unusual diarrhea, constipation, pain near the anus) TENDERNESS IN MOUTH AND THROAT WITH OR WITHOUT PRESENCE OF ULCERS (sore throat, sores in mouth, or a toothache) UNUSUAL RASH, SWELLING OR PAIN  UNUSUAL VAGINAL DISCHARGE OR ITCHING   Items with * indicate a potential emergency and should be followed up as soon as possible or go to the Emergency Department if any problems should occur.  Please show the CHEMOTHERAPY ALERT CARD or IMMUNOTHERAPY ALERT CARD at check-in to the Emergency Department and  triage nurse.  Should you have questions after your visit or need to cancel or reschedule your appointment, please contact [facility] [phone]  and follow the prompts.  Office hours are 8:00 a.m. to 4:30 p.m. Monday - Friday. Please note that voicemails left after 4:00 p.m. may not be returned until the following business day.  We are closed weekends and major holidays. You have access to a nurse at all times for urgent questions. Please call the main number to the clinic [phone] and follow the prompts.  For any non-urgent questions, you may also contact your provider using MyChart. We now offer e-Visits for anyone 18 and older to request care online for non-urgent symptoms. For details visit [email or web address].   Also download the MyChart app! Go to the app store, search "MyChart", open the app, select [name], and log in with your MyChart username and password.   

## 2022-07-27 ENCOUNTER — Ambulatory Visit: Payer: Medicare Other

## 2022-09-07 ENCOUNTER — Inpatient Hospital Stay: Payer: Medicare Other | Attending: Hematology

## 2022-09-07 DIAGNOSIS — D649 Anemia, unspecified: Secondary | ICD-10-CM | POA: Diagnosis present

## 2022-09-07 DIAGNOSIS — D472 Monoclonal gammopathy: Secondary | ICD-10-CM | POA: Diagnosis present

## 2022-09-07 DIAGNOSIS — D500 Iron deficiency anemia secondary to blood loss (chronic): Secondary | ICD-10-CM

## 2022-09-07 LAB — CBC WITH DIFFERENTIAL/PLATELET
Abs Immature Granulocytes: 0.02 10*3/uL (ref 0.00–0.07)
Basophils Absolute: 0 10*3/uL (ref 0.0–0.1)
Basophils Relative: 1 %
Eosinophils Absolute: 0.2 10*3/uL (ref 0.0–0.5)
Eosinophils Relative: 4 %
HCT: 29.6 % — ABNORMAL LOW (ref 39.0–52.0)
Hemoglobin: 8.6 g/dL — ABNORMAL LOW (ref 13.0–17.0)
Immature Granulocytes: 0 %
Lymphocytes Relative: 24 %
Lymphs Abs: 1.6 10*3/uL (ref 0.7–4.0)
MCH: 24.4 pg — ABNORMAL LOW (ref 26.0–34.0)
MCHC: 29.1 g/dL — ABNORMAL LOW (ref 30.0–36.0)
MCV: 83.9 fL (ref 80.0–100.0)
Monocytes Absolute: 0.4 10*3/uL (ref 0.1–1.0)
Monocytes Relative: 6 %
Neutro Abs: 4.3 10*3/uL (ref 1.7–7.7)
Neutrophils Relative %: 65 %
Platelets: 333 10*3/uL (ref 150–400)
RBC: 3.53 MIL/uL — ABNORMAL LOW (ref 4.22–5.81)
RDW: 18.6 % — ABNORMAL HIGH (ref 11.5–15.5)
WBC: 6.6 10*3/uL (ref 4.0–10.5)
nRBC: 0 % (ref 0.0–0.2)

## 2022-09-07 LAB — IRON AND TIBC
Iron: 27 ug/dL — ABNORMAL LOW (ref 45–182)
Saturation Ratios: 7 % — ABNORMAL LOW (ref 17.9–39.5)
TIBC: 389 ug/dL (ref 250–450)
UIBC: 362 ug/dL

## 2022-09-07 LAB — FERRITIN: Ferritin: 14 ng/mL — ABNORMAL LOW (ref 24–336)

## 2022-09-13 NOTE — Progress Notes (Unsigned)
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 male returns for routine follow-up of his normocytic anemia and MGUS (newly diagnosed).  He was last seen by Dr. Delton Coombes on 07/17/2022.  He reports some improvement in his energy after his IV Feraheme in January 2024, with recurrent fatigue less than a month later.  He denies any melanotic stool or hematochezia.  He has intermittent chest pain and follows closely with cardiology, most recently seen in January 2024.  He has chronic dyspnea on exertion, somewhat worsened lately.  He denies any pica, headaches, lightheadedness, or syncope.  He takes daily OTC vitamin B12.  He previously tried taking iron tablets but was unable to tolerate them due to constipation.  He has not seen gastroenterology for more than 3 years.  He has chronic knee pain, but denies any new bone pain or recent fractures.  He has chronic diabetic neuropathy; denies any new numbness or tingling.  He denies any new onset neurologic symptoms such as tinnitus, no hearing loss, blurred vision, headache, or dizziness.  He denies any B symptoms such as fever, chills, night sweats, unintentional weight loss.  No new masses or lymphadenopathy.  He has 25% energy and 100% appetite. He endorses that he is maintaining a stable weight.   ASSESSMENT & PLAN:  1.  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 - Colonoscopy (08/26/2017) normal. - Small bowel endoscopy (05/24/2018): 1 gastric polyp, 1 nonbleeding angiectasia in duodenum treated with APC.  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  Normal duodenum, first second and third parts. - He  has required intermittent IV iron, most recently with Feraheme 1020 mg on 07/22/2022.   - He was also receiving B12 injections, last given on 05/25/2022.  Now taking vitamin B12 OTC supplement at home. - Most recent labs (09/07/2022): Hgb 8.6/MCV 83.9.  Ferritin 14, iron saturation 7%. - Additional labs from January 2024 showed CKD stage IIIa, B12 898, MMA 237.   - No grossly melanotic stool or hematochezia - Symptomatic with severe fatigue - PLAN: Persistent anemia and iron deficiency despite recent IV repletion is concerning for ongoing GI blood loss.  Patient was last seen by GI > 3 years ago in [place], requests to establish care with local GI in [place] instead.  (Referral sent to [facility]) - Recommend IV Feraheme 510 mg x 3 doses  - We will recheck anemia panel in 8 weeks.   - Continue vitamin B12 OTC supplementation  2.  MGUS vs myeloma - Labs from 07/17/2022 show SPEP with M spike 1.1%.  Kappa free light chain mildly elevated 28.6, normal lambda light chain 20.9, normal ratio 1.37.  CMP with creatinine 1.34/GFR 58 (baseline).  Calcium 9.8, but patient previously had hypercalcemia up to 11.3 on 07/23/2021.   - Most recent CBC/D (09/07/2022) with Hgb 8.6, ferritin 14, iron saturation 7%.  Unclear if anemia solely due to iron deficiency, or due to bone marrow plasma cell disorder.   - Chronic knee pain and diabetic neuropathy.  Denies any new onset bone pain, neurologic changes, or B symptoms. - We discussed diagnosis of MGUS and spectrum of plasma  cell disorders. - PLAN: Labs today including SPEP, immunofixation, LDH, beta-2 microglobulin, light chains  - Check 24-hour urine with UPEP/UIFE.   - Skeletal survey today  - RTC in 3 weeks to discuss results and next steps, including possible need for bone marrow biopsy   3.  Social/family history: - Lives with wife at home.  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.  Quit smoking in 2023.  Smoked 1/3 pack/day for 50 years. - Multiple maternal aunts and uncles had cancers.  Patient does not know the types. - Other PMH includes diabetes, CVA, congestive heart failure, hypertension, hyperlipidemia, OSA on CPAP, PVD  PLAN SUMMARY: >> Labs today = immunofixation, SPEP, light chains, LDH, beta-2 microglobulin >> 24-hour urine study >> Skeletal survey >> IV Feraheme x 3 >> Office visit in 3 weeks to discuss above results and next steps, such as possible need for bone marrow biopsy  >> Referral to [facility]   >> After next visit (MGUS discussion) - needs to be scheduled for repeat anemia labs and possible IV iron around the end of April/early May 2024     REVIEW OF SYSTEMS:   Review of Systems  Constitutional:  Positive for fatigue. Negative for appetite change, chills, diaphoresis, fever and unexpected weight change.  HENT:   Negative for lump/mass and nosebleeds.   Eyes:  Negative for eye problems.  Respiratory:  Positive for cough and shortness of breath. Negative for hemoptysis.   Cardiovascular:  Positive for chest pain. Negative for leg swelling and palpitations.  Gastrointestinal:  Negative for abdominal pain, blood in stool, constipation, diarrhea, nausea and vomiting.  Genitourinary:  Negative for hematuria.   Musculoskeletal:  Positive for arthralgias and back pain.  Skin: Negative.   Neurological:  Positive for dizziness and numbness. Negative for headaches and light-headedness.  Hematological:  Does not bruise/bleed easily.     PHYSICAL EXAM:  ECOG PERFORMANCE STATUS: 2 - Symptomatic, <50% confined to bed  There were no vitals filed for this visit. There were no vitals filed for this visit. Physical Exam Constitutional:      Appearance: Normal appearance. He is obese.  Cardiovascular:     Heart sounds: Normal heart sounds.  Pulmonary:     Breath sounds: Normal breath sounds. Decreased air movement  present.  Neurological:     General: No focal deficit present.     Mental Status: Mental status is at baseline.  Psychiatric:        Behavior: Behavior normal. Behavior is cooperative.     PAST MEDICAL/SURGICAL HISTORY:  Past Medical History:  Diagnosis Date   Arthritis    "all over" (08/24/2017)   CHF (congestive heart failure) (HCC)    Chronic airway obstruction (HCC)    Chronic lower back pain    Coronary artery disease    Disorder of liver    "spots on my liver; they've rechecked them; they haven't changed" (08/24/2017)   Esophageal reflux    Gout    "on daily RX" (08/24/2017)   Hyperlipidemia    Hypertension    Hypokalemia 11/2016   Hypotestosteronism 07/01/2013   Insomnia    Iron deficiency anemia, unspecified 07/01/2013   Memory loss    On home oxygen therapy    "2L prn" (08/24/2017)   OSA on CPAP    PVD (peripheral vascular disease) ([place])    Renal disorder    Stroke ([place]) 01/2017   "left  sided weakness remains" (08/24/2017)   Type II diabetes mellitus ([place])    Vitamin D deficiency    Past Surgical History:  Procedure Laterality Date   COLONOSCOPY N/A 08/26/2017   Procedure: COLONOSCOPY;  Surgeon: Gatha Mayer, MD;  Location: [facility] ENDOSCOPY;  Service: Endoscopy;  Laterality: N/A;   ENTEROSCOPY N/A 05/24/2018   Procedure: ENTEROSCOPY;  Surgeon: Lavena Bullion, DO;  Location: [facility] ENDOSCOPY;  Service: Gastroenterology;  Laterality: N/A;   ESOPHAGOGASTRODUODENOSCOPY N/A 08/26/2017   Procedure: ESOPHAGOGASTRODUODENOSCOPY (EGD);  Surgeon: Gatha Mayer, MD;  Location[facility]COPY;  Service: Endoscopy;  Laterality: N/A;   HOT HEMOSTASIS N/A 05/24/2018   Procedure: HOT HEMOSTASIS (ARGON PLASMA COAGULATION/BICAP);  Surgeon: Lavena Bullion, DO;  Location: [facility] ENDOSCOPY;  Service: Gastroenterology;  Laterality: N/A;   KNEE ARTHROSCOPY Left    LOOP RECORDER IMPLANT  06/2017   POLYPECTOMY  05/24/2018   Procedure: POLYPECTOMY;  Surgeon: Lavena Bullion, DO;   Location: [facility] ENDOSCOPY;  Service: Gastroenterology;;   TEE WITHOUT CARDIOVERSION N/A 12/24/2016   Procedure: TRANSESOPHAGEAL ECHOCARDIOGRAM (TEE);  Surgeon: Fay Records, MD;  Location: [facility] ENDOSCOPY;  Service: Cardiovascular;  Laterality: N/A;    SOCIAL HISTORY:  Social History   Socioeconomic History   Marital status: Married    Spouse name: Not on file   Number of children: Not on file   Years of education: Not on file   Highest education level: Not on file  Occupational History   Occupation: diabled    Comment: as of early 52s  Tobacco Use   Smoking status: Former    Packs/day: 0.50    Years: 40.00    Total pack years: 20.00    Types: Cigarettes    Start date: 08/05/1983    Quit date: 08/15/2017    Years since quitting: 5.0   Smokeless tobacco: Never  Vaping Use   Vaping Use: Never used  Substance and Sexual Activity   Alcohol use: No    Alcohol/week: 0.0 standard drinks of alcohol   Drug use: No   Sexual activity: Not Currently  Other Topics Concern   Not on file  Social History Narrative   Not on file   Social Determinants of Health   Financial Resource Strain: Not on file  Food Insecurity: No Food Insecurity (07/17/2022)   Hunger Vital Sign    Worried About Running Out of Food in the Last Year: Never true    Why in the Last Year: Never true  Transportation Needs: No Transportation Needs (07/17/2022)   PRAPARE - Hydrologist (Medical): No    Lack of Transportation (Non-Medical): No  Physical Activity: Not on file  Stress: Not on file  Social Connections: Not on file  Intimate Partner Violence: Not At Risk (07/17/2022)   Humiliation, Afraid, Rape, and Kick questionnaire    Fear of Current or Ex-Partner: No    Emotionally Abused: No    Physically Abused: No    Sexually Abused: No    FAMILY HISTORY:  Family History  Problem Relation Age of Onset   Heart disease Mother    Diabetes Father    Stroke Brother    Stroke  Paternal Uncle    Diabetes Sister    Heart attack Brother    Heart attack Brother     CURRENT MEDICATIONS:  Outpatient Encounter Medications as of 09/14/2022  Medication Sig   albuterol (VENTOLIN HFA) 108 (90 Base) MCG/ACT inhaler Inhale 2 puffs into the lungs  every 6 (six) hours as needed for wheezing or shortness of breath.   allopurinol (ZYLOPRIM) 300 MG tablet Take 300 mg by mouth daily.   amLODipine (NORVASC) 5 MG tablet Take 5 mg by mouth daily.    atorvastatin (LIPITOR) 80 MG tablet Take 1 tablet (80 mg total) by mouth every morning.   atorvastatin (LIPITOR) 80 MG tablet Take 1 tablet by mouth daily.   Blood Glucose Monitoring Suppl (FIFTY50 GLUCOSE METER 2.0) w/Device KIT To check blood sugars TID or Use as instructed Include strips. Lancets, lancet device, control solution. batteries   clopidogrel (PLAVIX) 75 MG tablet Take 1 tablet (75 mg total) by mouth daily.   colchicine 0.6 MG tablet Take 0.6 mg by mouth.   Cyanocobalamin (VITAMIN B-12 IJ) Inject 1,000 mg as directed every 30 (thirty) days. X 4   diclofenac sodium (VOLTAREN) 1 % GEL    diclofenac Sodium (VOLTAREN) 1 % GEL SMARTSIG:4 Gram(s) Topical 3 Times Daily PRN   DULoxetine (CYMBALTA) 60 MG capsule Take 60 mg by mouth 2 (two) times daily.    fluticasone (FLONASE) 50 MCG/ACT nasal spray Place 1 spray into both nostrils daily.   fluticasone furoate-vilanterol (BREO ELLIPTA) 200-25 MCG/ACT AEPB Inhale 1 puff into the lungs daily.   furosemide (LASIX) 20 MG tablet Take 20 mg by mouth every Tuesday, Thursday, and Saturday at 6 PM.    gabapentin (NEURONTIN) 300 MG capsule Take 300 mg by mouth 4 (four) times daily.    glucose blood test strip 1 each by Misc.(Non-Drug; Combo Route) route daily.   Insulin Glargine (TOUJEO SOLOSTAR [place]) Inject 70 Units into the skin at bedtime.   insulin NPH-regular Human (NOVOLIN 70/30) (70-30) 100 UNIT/ML injection Please inject 25 units twice daily before breakfast and dinner. (Patient taking  differently: Inject 25-30 Units into the skin See admin instructions. Inject 30 units with breakfast & 28 units with dinner.)   Insulin Pen Needle (FIFTY50 PEN NEEDLES) 32G X 4 MM MISC 1 each by Misc.(Non-Drug; Combo Route) route daily.   insulin regular (NOVOLIN R) 100 units/mL injection Use as sliding scale up to 10 units tid with meals   ipratropium-albuterol (DUONEB) 0.5-2.5 (3) MG/3ML SOLN USE 1 VIAL IN NEBULIZER 4 TIMES DAILY   Lancets (ONETOUCH ULTRASOFT) lancets USE 1 LANCET TO CHECK GLUCOSE IN THE MORNING BEFORE BREAKFAST   losartan (COZAAR) 100 MG tablet Take 100 mg by mouth daily.    meclizine (ANTIVERT) 12.5 MG tablet Take 1 tablet (12.5 mg total) by mouth 3 (three) times daily as needed for dizziness.   methocarbamol (ROBAXIN) 500 MG tablet Take 500 mg by mouth 3 (three) times daily.   metoprolol (TOPROL-XL) 200 MG 24 hr tablet Take 200 mg by mouth daily.   pantoprazole (PROTONIX) 20 MG tablet Take 20 mg by mouth daily.    potassium chloride SA (K-DUR,KLOR-CON) 20 MEQ tablet Take 1 tablet (20 mEq total) by mouth 2 (two) times daily. (Patient taking differently: Take 20 mEq by mouth 3 (three) times daily. Take 1 tablet (20 meq) scheduled three times daily & takes an additional tablet at lunch on Tuesdays, Thursdays, & Saturdays due to lasix)   primidone (MYSOLINE) 50 MG tablet Take 100 mg by mouth at bedtime.    sildenafil (VIAGRA) 100 MG tablet Take 1 tablet by mouth daily as needed.   spironolactone (ALDACTONE) 25 MG tablet Take 25 mg by mouth daily.   tamsulosin (FLOMAX) 0.4 MG CAPS capsule Take 1 capsule (0.4 mg total) by mouth daily after breakfast.  tiZANidine (ZANAFLEX) 4 MG tablet Take 4 mg by mouth 3 (three) times daily as needed for muscle spasms.    traMADol (ULTRAM) 50 MG tablet Take 50 mg by mouth daily as needed for moderate pain.    zolpidem (AMBIEN CR) 12.5 MG CR tablet Take 12.5 mg by mouth at bedtime as needed.   No facility-administered encounter medications on file  as of 09/14/2022.    ALLERGIES:  No Known Allergies  LABORATORY DATA:  I have reviewed the labs as listed.  CBC    Component Value Date/Time   WBC 6.6 09/07/2022 1052   RBC 3.53 (L) 09/07/2022 1052   HGB 8.6 (L) 09/07/2022 1052   HGB 12.1 (L) 11/25/2021 1426   HGB 15.2 12/18/2016 0804   HCT 29.6 (L) 09/07/2022 1052   HCT 46.5 12/18/2016 0804   PLT 333 09/07/2022 1052   PLT 285 11/25/2021 1426   PLT 291 12/18/2016 0804   MCV 83.9 09/07/2022 1052   MCV 91 12/18/2016 0804   MCH 24.4 (L) 09/07/2022 1052   MCHC 29.1 (L) 09/07/2022 1052   RDW 18.6 (H) 09/07/2022 1052   RDW 14.7 12/18/2016 0804   LYMPHSABS 1.6 09/07/2022 1052   LYMPHSABS 2.0 12/18/2016 0804   MONOABS 0.4 09/07/2022 1052   EOSABS 0.2 09/07/2022 1052   EOSABS 0.2 12/18/2016 0804   BASOSABS 0.0 09/07/2022 1052   BASOSABS 0.0 12/18/2016 0804      Latest Ref Rng & Units 07/17/2022    1:17 PM 11/25/2021    2:26 PM 08/20/2021    2:09 PM  CMP  Glucose 70 - 99 mg/dL 146  119  224   BUN 8 - 23 mg/dL '12  15  15   '$ Creatinine 0.61 - 1.24 mg/dL 1.34  1.50  1.51   Sodium 135 - 145 mmol/L 135  135  134   Potassium 3.5 - 5.1 mmol/L 4.1  4.3  4.5   Chloride 98 - 111 mmol/L 104  103  100   CO2 22 - 32 mmol/L '25  24  27   '$ Calcium 8.9 - 10.3 mg/dL 9.8  10.1  10.6   Total Protein 6.5 - 8.1 g/dL 7.7  7.8  7.5   Total Bilirubin 0.3 - 1.2 mg/dL 0.3  0.4  0.4   Alkaline Phos 38 - 126 U/L 90  102  110   AST 15 - 41 U/L '18  20  15   '$ ALT 0 - 44 U/L '15  25  20     '$ DIAGNOSTIC IMAGING:  I have independently reviewed the relevant imaging and discussed with the patient.   WRAP UP:  All questions were answered. The patient knows to call the clinic with any problems, questions or concerns.  Medical decision making: Moderate  Time spent on visit: I spent 25 minutes counseling the patient face to face. The total time spent in the appointment was 40 minutes and more than 50% was on counseling.  Harriett Rush, PA-C  09/14/2022  6:48 PM

## 2022-09-14 ENCOUNTER — Inpatient Hospital Stay: Payer: Medicare Other

## 2022-09-14 ENCOUNTER — Encounter: Payer: Self-pay | Admitting: Physician Assistant

## 2022-09-14 ENCOUNTER — Ambulatory Visit (HOSPITAL_COMMUNITY)
Admission: RE | Admit: 2022-09-14 | Discharge: 2022-09-14 | Disposition: A | Discharge status: Home / Self Care | Payer: Medicare Other | Source: Ambulatory Visit | Attending: Physician Assistant | Admitting: Physician Assistant

## 2022-09-14 ENCOUNTER — Inpatient Hospital Stay: Payer: Medicare Other | Attending: Hematology | Admitting: Physician Assistant

## 2022-09-14 VITALS — BP 112/80 | HR 58 | Temp 99.5°F | Resp 18 | Wt 232.1 lb

## 2022-09-14 DIAGNOSIS — D472 Monoclonal gammopathy: Secondary | ICD-10-CM | POA: Insufficient documentation

## 2022-09-14 DIAGNOSIS — D631 Anemia in chronic kidney disease: Secondary | ICD-10-CM | POA: Diagnosis not present

## 2022-09-14 DIAGNOSIS — E1140 Type 2 diabetes mellitus with diabetic neuropathy, unspecified: Secondary | ICD-10-CM | POA: Diagnosis not present

## 2022-09-14 DIAGNOSIS — Z87891 Personal history of nicotine dependence: Secondary | ICD-10-CM | POA: Diagnosis not present

## 2022-09-14 DIAGNOSIS — M25569 Pain in unspecified knee: Secondary | ICD-10-CM | POA: Insufficient documentation

## 2022-09-14 DIAGNOSIS — G8929 Other chronic pain: Secondary | ICD-10-CM | POA: Insufficient documentation

## 2022-09-14 DIAGNOSIS — D500 Iron deficiency anemia secondary to blood loss (chronic): Secondary | ICD-10-CM

## 2022-09-14 DIAGNOSIS — I69359 Hemiplegia and hemiparesis following cerebral infarction affecting unspecified side: Secondary | ICD-10-CM | POA: Insufficient documentation

## 2022-09-14 DIAGNOSIS — N189 Chronic kidney disease, unspecified: Secondary | ICD-10-CM | POA: Diagnosis not present

## 2022-09-14 LAB — LACTATE DEHYDROGENASE: LDH: 79 U/L — ABNORMAL LOW (ref 98–192)

## 2022-09-14 NOTE — Patient Instructions (Signed)
[place] [place] [facility] [place] **   You were seen today by Tarri Abernethy PA-C for your follow-up visit.    MGUS ("monoclonal gammopathy of undetermined significance") As we discussed, this is a "precancerous" condition where you have slightly increased plasma cells making a slightly increased amount of abnormal immunoglobulin proteins. Although MGUS is not causing any current problems in your body, it has a 1% each year risk of progression to multiple myeloma cancer. We will check additional tests (blood work, urine study, x-rays) to make sure you do not have any evidence of multiple myeloma cancer. Please see the attached handout for further information regarding MGUS.  We will see you for follow-up in 3 weeks to discuss results.  IRON DEFICIENCY ANEMIA Your blood and iron levels remain low despite your treatment with IV iron.  This is concerning for possible ongoing blood loss from your stomach or intestines. We will set you up for IV iron x 3 doses. We will refer you to gastroenterology (GI doctors) to see if you have any bleeding from your stomach or intestines.    FOLLOW-UP APPOINTMENT: Office visit in 3 weeks.  ** Thank you for trusting me with your healthcare!  I strive to provide all of my patients with quality care at each visit.  If you receive a survey for this visit, I would be so grateful to you for taking the time to provide feedback.  Thank you in advance!  ~ Amandine Covino                   Dr. Derek Jack   &   Tarri Abernethy, PA-C   - - - - - - - - - - - - - - - - - -    Thank you for choosing [facility] to provide your oncology and hematology care.  To afford each patient quality time with our provider, please arrive at least 15 minutes before your scheduled appointment time.   If you have a lab appointment with the [facility] please come in thru the Main  Entrance and check in at the main information desk.  You need to re-schedule your appointment should you arrive 10 or more minutes late.  We strive to give you quality time with our providers, and arriving late affects you and other patients whose appointments are after yours.  Also, if you no show three or more times for appointments you may be dismissed from the clinic at the providers discretion.     Again, thank you for choosing [facility].  Our hope is that these requests will decrease the amount of time that you wait before being seen by our physicians.       _____________________________________________________________  Should you have questions after your visit to [facility], please contact our office at [phone] and follow the prompts.  Our office hours are 8:00 a.m. and 4:30 p.m. Monday - Friday.  Please note that voicemails left after 4:00 p.m. may not be returned until the following business day.  We are closed weekends and major holidays.  You do have access to a nurse 24-7, just call the main number to the clinic [phone] and do not press any options, hold on the line and a nurse will answer the phone.    For prescription refill requests, have your pharmacy contact our office and allow 72 hours.

## 2022-09-15 ENCOUNTER — Encounter: Payer: Self-pay | Admitting: Hematology

## 2022-09-15 ENCOUNTER — Inpatient Hospital Stay: Payer: Medicare Other

## 2022-09-15 VITALS — BP 93/65 | HR 82 | Temp 98.4°F | Resp 16

## 2022-09-15 DIAGNOSIS — D500 Iron deficiency anemia secondary to blood loss (chronic): Secondary | ICD-10-CM | POA: Diagnosis not present

## 2022-09-15 DIAGNOSIS — D509 Iron deficiency anemia, unspecified: Secondary | ICD-10-CM

## 2022-09-15 LAB — KAPPA/LAMBDA LIGHT CHAINS
Kappa free light chain: 26.7 mg/L — ABNORMAL HIGH (ref 3.3–19.4)
Kappa, lambda light chain ratio: 1.22 (ref 0.26–1.65)
Lambda free light chains: 21.8 mg/L (ref 5.7–26.3)

## 2022-09-15 LAB — BETA 2 MICROGLOBULIN, SERUM: Beta-2 Microglobulin: 3.7 mg/L — ABNORMAL HIGH (ref 0.6–2.4)

## 2022-09-15 MED ORDER — SODIUM CHLORIDE 0.9 % IV SOLN: Freq: Once | INTRAVENOUS | Status: AC

## 2022-09-15 MED ORDER — SODIUM CHLORIDE 0.9 % IV SOLN
510.0000 mg | Freq: Once | INTRAVENOUS | Status: AC
  Administered 2022-09-15: 510 mg via INTRAVENOUS
  Filled 2022-09-15: qty 510

## 2022-09-15 MED ORDER — ACETAMINOPHEN 325 MG PO TABS
650.0000 mg | ORAL_TABLET | Freq: Once | ORAL | Status: AC
  Administered 2022-09-15: 650 mg via ORAL
  Filled 2022-09-15: qty 2

## 2022-09-15 MED ORDER — CETIRIZINE HCL 10 MG PO TABS
10.0000 mg | ORAL_TABLET | Freq: Once | ORAL | Status: AC
  Administered 2022-09-15: 10 mg via ORAL
  Filled 2022-09-15: qty 1

## 2022-09-15 NOTE — Progress Notes (Signed)
Patient presents today for iron infusion.  Patient is in satisfactory condition with no new complaints voiced.  Vital signs are stable.  IV placed in right AC.  IV flushed well with good blood return noted.  We will proceed with infusion per provider orders.  Patient tolerated infusion well with no complaints voiced.  Patient left ambulatory in stable condition.  Vital signs stable at discharge.  Follow up as scheduled.

## 2022-09-15 NOTE — Patient Instructions (Signed)
[facility]  Discharge Instructions: Thank you for choosing [facility] to provide your oncology and hematology care.  If you have a lab appointment with the [facility], please come in thru the Main Entrance and check in at the main information desk.  Wear comfortable clothing and clothing appropriate for easy access to any Portacath or PICC line.   We strive to give you quality time with your provider. You may need to reschedule your appointment if you arrive late (15 or more minutes).  Arriving late affects you and other patients whose appointments are after yours.  Also, if you miss three or more appointments without notifying the office, you may be dismissed from the clinic at the provider's discretion.      For prescription refill requests, have your pharmacy contact our office and allow 72 hours for refills to be completed.    Ferumoxytol Injection What is this medication? FERUMOXYTOL (FER ue MOX i tol) treats low levels of iron in your body (iron deficiency anemia). Iron is a mineral that plays an important role in making red blood cells, which carry oxygen from your lungs to the rest of your body. This medicine may be used for other purposes; ask your health care provider or pharmacist if you have questions. COMMON BRAND NAME(S): Feraheme What should I tell my care team before I take this medication? They need to know if you have any of these conditions: Anemia not caused by low iron levels High levels of iron in the blood Magnetic resonance imaging (MRI) test scheduled An unusual or allergic reaction to iron, other medications, foods, dyes, or preservatives Pregnant or trying to get pregnant Breastfeeding How should I use this medication? This medication is injected into a vein. It is given by your care team in a hospital or clinic setting. Talk to your care team the use of this medication in children. Special care may be needed. Overdosage:  If you think you have taken too much of this medicine contact a poison control center or emergency room at once. NOTE: This medicine is only for you. Do not share this medicine with others. What if I miss a dose? It is important not to miss your dose. Call your care team if you are unable to keep an appointment. What may interact with this medication? Other iron products This list may not describe all possible interactions. Give your health care provider a list of all the medicines, herbs, non-prescription drugs, or dietary supplements you use. Also tell them if you smoke, drink alcohol, or use illegal drugs. Some items may interact with your medicine. What should I watch for while using this medication? Visit your care team regularly. Tell your care team if your symptoms do not start to get better or if they get worse. You may need blood work done while you are taking this medication. You may need to follow a special diet. Talk to your care team. Foods that contain iron include: whole grains/cereals, dried fruits, beans, or peas, leafy green vegetables, and organ meats (liver, kidney). What side effects may I notice from receiving this medication? Side effects that you should report to your care team as soon as possible: Allergic reactions--skin rash, itching, hives, swelling of the face, lips, tongue, or throat Low blood pressure--dizziness, feeling faint or lightheaded, blurry vision Shortness of breath Side effects that usually do not require medical attention (report to your care team if they continue or are bothersome): Flushing Headache  Joint pain Muscle pain Nausea Pain, redness, or irritation at injection site This list may not describe all possible side effects. Call your doctor for medical advice about side effects. You may report side effects to FDA at 1-800-FDA-1088. Where should I keep my medication? This medication is given in a hospital or clinic and will not be stored at  home. NOTE: This sheet is a summary. It may not cover all possible information. If you have questions about this medicine, talk to your doctor, pharmacist, or health care provider.  2023 Elsevier/Gold Standard (2020-11-20 00:00:00)    To help prevent nausea and vomiting after your treatment, we encourage you to take your nausea medication as directed.  BELOW ARE SYMPTOMS THAT SHOULD BE REPORTED IMMEDIATELY: *FEVER GREATER THAN 100.4 F (38 C) OR HIGHER *CHILLS OR SWEATING *NAUSEA AND VOMITING THAT IS NOT CONTROLLED WITH YOUR NAUSEA MEDICATION *UNUSUAL SHORTNESS OF BREATH *UNUSUAL BRUISING OR BLEEDING *URINARY PROBLEMS (pain or burning when urinating, or frequent urination) *BOWEL PROBLEMS (unusual diarrhea, constipation, pain near the anus) TENDERNESS IN MOUTH AND THROAT WITH OR WITHOUT PRESENCE OF ULCERS (sore throat, sores in mouth, or a toothache) UNUSUAL RASH, SWELLING OR PAIN  UNUSUAL VAGINAL DISCHARGE OR ITCHING   Items with * indicate a potential emergency and should be followed up as soon as possible or go to the Emergency Department if any problems should occur.  Please show the CHEMOTHERAPY ALERT CARD or IMMUNOTHERAPY ALERT CARD at check-in to the Emergency Department and triage nurse.  Should you have questions after your visit or need to cancel or reschedule your appointment, please contact [facility] [phone]  and follow the prompts.  Office hours are 8:00 a.m. to 4:30 p.m. Monday - Friday. Please note that voicemails left after 4:00 p.m. may not be returned until the following business day.  We are closed weekends and major holidays. You have access to a nurse at all times for urgent questions. Please call the main number to the clinic [phone] and follow the prompts.  For any non-urgent questions, you may also contact your provider using MyChart. We now offer e-Visits for anyone 75 and older to request care online for non-urgent symptoms. For  details visit [email or web address].   Also download the MyChart app! Go to the app store, search "MyChart", open the app, select [facility], and log in with your MyChart username and password.

## 2022-09-16 ENCOUNTER — Other Ambulatory Visit (HOSPITAL_COMMUNITY)
Admission: RE | Admit: 2022-09-16 | Discharge: 2022-09-16 | Disposition: A | Discharge status: Home / Self Care | Payer: Medicare Other | Source: Ambulatory Visit | Attending: Physician Assistant | Admitting: Physician Assistant

## 2022-09-16 DIAGNOSIS — D472 Monoclonal gammopathy: Secondary | ICD-10-CM | POA: Insufficient documentation

## 2022-09-18 LAB — UPEP/UIFE/LIGHT CHAINS/TP, 24-HR UR
% BETA, Urine: 25.5 %
ALPHA 1 URINE: 3.4 %
Albumin, U: 34.7 %
Alpha 2, Urine: 7.6 %
Free Kappa Lt Chains,Ur: 71.82 mg/L (ref 1.17–86.46)
Free Kappa/Lambda Ratio: 6.48 (ref 1.83–14.26)
Free Lambda Lt Chains,Ur: 11.09 mg/L (ref 0.27–15.21)
GAMMA GLOBULIN URINE: 28.9 %
M-SPIKE %, Urine: 12.2 % — ABNORMAL HIGH
M-Spike, Mg/24 Hr: 134 mg/24 hr — ABNORMAL HIGH
Total Protein, Urine-Ur/day: 1098 mg/24 hr — ABNORMAL HIGH (ref 30–150)
Total Protein, Urine: 43.9 mg/dL
Total Volume: 2500

## 2022-09-18 LAB — PROTEIN ELECTROPHORESIS, SERUM
A/G Ratio: 0.8 (ref 0.7–1.7)
Albumin ELP: 3.3 g/dL (ref 2.9–4.4)
Alpha-1-Globulin: 0.3 g/dL (ref 0.0–0.4)
Alpha-2-Globulin: 1 g/dL (ref 0.4–1.0)
Beta Globulin: 1 g/dL (ref 0.7–1.3)
Gamma Globulin: 1.7 g/dL (ref 0.4–1.8)
Globulin, Total: 4 g/dL — ABNORMAL HIGH (ref 2.2–3.9)
M-Spike, %: 1.5 g/dL — ABNORMAL HIGH
Total Protein ELP: 7.3 g/dL (ref 6.0–8.5)

## 2022-09-21 LAB — IMMUNOFIXATION ELECTROPHORESIS
IgA: 156 mg/dL (ref 61–437)
IgG (Immunoglobin G), Serum: 2066 mg/dL — ABNORMAL HIGH (ref 603–1613)
IgM (Immunoglobulin M), Srm: 58 mg/dL (ref 20–172)
Total Protein ELP: 7.3 g/dL (ref 6.0–8.5)

## 2022-09-21 NOTE — Progress Notes (Unsigned)
James Zuniga, [place] [id number]   CLINIC:  Medical Oncology/Hematology  PCP:  James Harvey, NP [place] [id number] [phone]   REASON FOR VISIT:  Follow-up for MGUS and normocytic anemia  CURRENT THERAPY: IV iron  INTERVAL HISTORY:   James Zuniga 67 y.o. 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  (He also follows with [place] for iron deficiency anemia, which was addressed in fall at his last visit.)  At today's visit, he reports feeling fair.  He denies any changes in his symptoms or baseline health status since his visit last week.   He continues to deny any melena or hematochezia.  He denies any chest pain today, but does follow with cardiology.  Chronic dyspnea on exertion is stable.  He denies any new onset bone pain or fractures, but does have chronic knee pain.  His chronic diabetic neuropathy is stable and he denies any new onset neurologic symptoms.  He denies any B symptoms, masses, or lymphadenopathy.    He has 70% energy and 100% appetite. He endorses that he is maintaining a stable weight.   ASSESSMENT & PLAN:  1.  MGUS vs myeloma - Labs from 07/17/2022 show SPEP with M spike 1.1%.  Kappa free light chain mildly elevated 28.6, normal lambda light chain 20.9, normal ratio 1.37.  CMP with creatinine 1.34/GFR 58 (baseline).  Calcium 9.8, but patient previously had hypercalcemia up to 11.3 on 07/23/2021.   - Most recent CBC/D (09/07/2022) with Hgb 8.6, ferritin 14, iron saturation 7%.  Unclear if anemia solely due to iron deficiency, or due to bone marrow plasma cell disorder.   - Chronic knee pain and diabetic neuropathy.  Denies any new onset bone pain, neurologic changes, or B symptoms. - MGUS/myeloma workup (09/14/2022): Immunofixation: IgG lambda monoclonal protein SPEP shows M spike 1.5% Serum free light chains = normal lambda 21.8, mildly elevated  kappa 26.7, normal ratio 1.22 24-hour urine with UPEP/UIFE: Total protein 1098 mg/24-hr, urine M spike 12.2% (134 mg/24-hr).  Immunofixation confirms IgG lambda. Low LDH 79, mildly elevated beta-2 microglobulin 3.7 - Unclear if patient has CRAB features at this time.  He has baseline CKD stage IIIa and anemia that may or may not be entirely attributable to his iron deficiency.  He has previously had hypercalcemia up to 11.3 (07/23/2021), although most recent calcium was normal at 9.8. - Skeletal survey (09/15/2022): Degenerative changes only, no evidence of lytic lesion - We discussed diagnosis of MGUS and spectrum of plasma cell disorders.  Unclear at this time where patient falls in spectrum from MGUS to myeloma, but he has some concerning features warranting further workup, including his significant proteinuria and rapid increase in M spike (from 1.1 to 1.5 in the course of 2 months).  Additionally, he has lambda type monoclonal protein, which may be underestimated in his serum labs. - PLAN: Recommend bone marrow biopsy and PET scan for better staging and prognosis of MGUS versus smoldering myeloma versus multiple myeloma - Office visit with Dr. Delton Coombes 1 week after bone marrow biopsy and PET scan.   2.  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 - Colonoscopy (08/26/2017) normal. - Small bowel endoscopy (05/24/2018): 1 gastric polyp, 1 nonbleeding angiectasia in duodenum treated with APC.  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  Normal duodenum, first second and third parts. -  He has required intermittent IV iron, most recently with Feraheme 1020 mg on 07/22/2022.   - He was also receiving B12 injections, last given on 05/25/2022.  Now taking vitamin B12 OTC supplement at home. - Most recent labs (09/07/2022): Hgb 8.6/MCV 83.9.  Ferritin 14, iron saturation 7%. - Additional labs from January 2024 showed CKD stage IIIa, B12 898, MMA 237.   - No grossly melanotic stool  or hematochezia - Symptomatic with severe fatigue - PLAN: Persistent anemia and iron deficiency despite recent IV repletion is concerning for ongoing GI blood loss.  Patient has been referred to GI.   He has been scheduled for IV Feraheme 510 mg x 3 doses as per his last visit on 09/14/2022.  Recommended to continue vitamin B12 OTC supplementation.  We are planning to recheck anemia panel in 8 weeks.  3.  Social/family history: - Lives with wife at home.  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.  Quit smoking in 2023.  Smoked 1/3 pack/day for 50 years. - Multiple maternal aunts and uncles had cancers.  Patient does not know the types. - Other PMH includes diabetes, CVA, congestive heart failure, hypertension, hyperlipidemia, OSA on CPAP, PVD  PLAN SUMMARY: >> PET scan (whole body) >> Bone marrow biopsy >> MD VISIT with DR. KATRAGADDA about 1 week after biopsy/PET >> (Continue with previously scheduled IV iron)     REVIEW OF SYSTEMS:   Review of Systems  Constitutional:  Positive for fatigue. Negative for appetite change, chills, diaphoresis, fever and unexpected weight change.  HENT:   Positive for trouble swallowing (Difficulty chewing due to teeth). Negative for lump/mass and nosebleeds.   Eyes:  Negative for eye problems.  Respiratory:  Positive for shortness of breath (with exertion). Negative for cough and hemoptysis.   Cardiovascular:  Positive for palpitations. Negative for chest pain and leg swelling.  Gastrointestinal:  Negative for abdominal pain, blood in stool, constipation, diarrhea, nausea and vomiting.  Genitourinary:  Negative for hematuria.   Skin: Negative.   Neurological:  Positive for dizziness and numbness. Negative for headaches and light-headedness.  Hematological:  Does not bruise/bleed easily.     PHYSICAL EXAM:  ECOG PERFORMANCE STATUS: 2 - Symptomatic, <50% confined to bed  There were no vitals  filed for this visit. There were no vitals filed for this visit. Physical Exam Constitutional:      Appearance: Normal appearance. He is obese.  Cardiovascular:     Heart sounds: Normal heart sounds.  Pulmonary:     Breath sounds: Normal breath sounds. Decreased air movement present.  Neurological:     General: No focal deficit present.     Mental Status: Mental status is at baseline.     Gait: Gait abnormal (ambulates with cane).  Psychiatric:        Behavior: Behavior normal. Behavior is cooperative.     PAST MEDICAL/SURGICAL HISTORY:  Past Medical History:  Diagnosis Date   Arthritis    "all over" (08/24/2017)   CHF (congestive heart failure) (HCC)    Chronic airway obstruction (HCC)    Chronic lower back pain    Coronary artery disease    Disorder of liver    "spots on my liver; they've rechecked them; they haven't changed" (08/24/2017)   Esophageal reflux    Gout    "on daily RX" (08/24/2017)   Hyperlipidemia    Hypertension    Hypokalemia 11/2016  Hypotestosteronism 07/01/2013   Insomnia    Iron deficiency anemia, unspecified 07/01/2013   Memory loss    On home oxygen therapy    "2L prn" (08/24/2017)   OSA on CPAP    PVD (peripheral vascular disease) ([facility])    Renal disorder    Stroke ([facility]) 01/2017   "left sided weakness remains" (08/24/2017)   Type II diabetes mellitus ([facility])    Vitamin D deficiency    Past Surgical History:  Procedure Laterality Date   COLONOSCOPY N/A 08/26/2017   Procedure: COLONOSCOPY;  Surgeon: Gatha Mayer, MD;  Location: [facility];  Service: Endoscopy;  Laterality: N/A;   ENTEROSCOPY N/A 05/24/2018   Procedure: ENTEROSCOPY;  Surgeon: Lavena Bullion, DO;  Location: [facility] ENDOSCOPY;  Service: Gastroenterology;  Laterality: N/A;   ESOPHAGOGASTRODUODENOSCOPY N/A 08/26/2017   Procedure: ESOPHAGOGASTRODUODENOSCOPY (EGD);  Surgeon: Gatha Mayer, MD;  Location: [facility] ENDOSCOPY;  Service: Endoscopy;  Laterality: N/A;   HOT HEMOSTASIS N/A  05/24/2018   Procedure: HOT HEMOSTASIS (ARGON PLASMA COAGULATION/BICAP);  Surgeon: Lavena Bullion, DO;  Location: [facility] ENDOSCOPY;  Service: Gastroenterology;  Laterality: N/A;   KNEE ARTHROSCOPY Left    LOOP RECORDER IMPLANT  06/2017   POLYPECTOMY  05/24/2018   Procedure: POLYPECTOMY;  Surgeon: Lavena Bullion, DO;  Location: [facility] ENDOSCOPY;  Service: Gastroenterology;;   TEE WITHOUT CARDIOVERSION N/A 12/24/2016   Procedure: TRANSESOPHAGEAL ECHOCARDIOGRAM (TEE);  Surgeon: Fay Records, MD;  Location: [facility] ENDOSCOPY;  Service: Cardiovascular;  Laterality: N/A;    SOCIAL HISTORY:  Social History   Socioeconomic History   Marital status: Married    Spouse name: Not on file   Number of children: Not on file   Years of education: Not on file   Highest education level: Not on file  Occupational History   Occupation: diabled    Comment: as of early 17s  Tobacco Use   Smoking status: Former    Packs/day: 0.50    Years: 40.00    Total pack years: 20.00    Types: Cigarettes    Start date: 08/05/1983    Quit date: 08/15/2017    Years since quitting: 5.1   Smokeless tobacco: Never  Vaping Use   Vaping Use: Never used  Substance and Sexual Activity   Alcohol use: No    Alcohol/week: 0.0 standard drinks of alcohol   Drug use: No   Sexual activity: Not Currently  Other Topics Concern   Not on file  Social History Narrative   Not on file   Social Determinants of Health   Financial Resource Strain: Not on file  Food Insecurity: No Food Insecurity (07/17/2022)   Hunger Vital Sign    Worried About Running Out of Food in the Last Year: Never true    [place] in the Last Year: Never true  Transportation Needs: No Transportation Needs (07/17/2022)   PRAPARE - Hydrologist (Medical): No    Lack of Transportation (Non-Medical): No  Physical Activity: Not on file  Stress: Not on file  Social Connections: Not on file  Intimate Partner Violence: Not At  Risk (07/17/2022)   Humiliation, Afraid, Rape, and Kick questionnaire    Fear of Current or Ex-Partner: No    Emotionally Abused: No    Physically Abused: No    Sexually Abused: No    FAMILY HISTORY:  Family History  Problem Relation Age of Onset   Heart disease Mother    Diabetes Father  Stroke Brother    Stroke Paternal Uncle    Diabetes Sister    Heart attack Brother    Heart attack Brother     CURRENT MEDICATIONS:  Outpatient Encounter Medications as of 09/22/2022  Medication Sig   albuterol (VENTOLIN HFA) 108 (90 Base) MCG/ACT inhaler Inhale 2 puffs into the lungs every 6 (six) hours as needed for wheezing or shortness of breath.   allopurinol (ZYLOPRIM) 300 MG tablet Take 300 mg by mouth daily.   atorvastatin (LIPITOR) 80 MG tablet Take 1 tablet (80 mg total) by mouth every morning.   atorvastatin (LIPITOR) 80 MG tablet Take 1 tablet by mouth daily.   Blood Glucose Monitoring Suppl (FIFTY50 GLUCOSE METER 2.0) w/Device KIT To check blood sugars TID or Use as instructed Include strips. Lancets, lancet device, control solution. batteries   clopidogrel (PLAVIX) 75 MG tablet Take 1 tablet (75 mg total) by mouth daily.   colchicine 0.6 MG tablet Take 0.6 mg by mouth.   Cyanocobalamin (VITAMIN B-12 IJ) Inject 1,000 mg as directed every 30 (thirty) days. X 4   diclofenac sodium (VOLTAREN) 1 % GEL    diclofenac Sodium (VOLTAREN) 1 % GEL SMARTSIG:4 Gram(s) Topical 3 Times Daily PRN   DULoxetine (CYMBALTA) 60 MG capsule Take 60 mg by mouth 2 (two) times daily.    fluticasone (FLONASE) 50 MCG/ACT nasal spray Place 1 spray into both nostrils daily.   fluticasone furoate-vilanterol (BREO ELLIPTA) 200-25 MCG/ACT AEPB Inhale 1 puff into the lungs daily.   furosemide (LASIX) 20 MG tablet Take 20 mg by mouth every Tuesday, Thursday, and Saturday at 6 PM.    gabapentin (NEURONTIN) 300 MG capsule Take 300 mg by mouth 4 (four) times daily.    glucose blood test strip 1 each by Misc.(Non-Drug;  Combo Route) route daily.   Insulin Glargine (TOUJEO SOLOSTAR [place]) Inject 70 Units into the skin at bedtime.   insulin NPH-regular Human (NOVOLIN 70/30) (70-30) 100 UNIT/ML injection Please inject 25 units twice daily before breakfast and dinner. (Patient taking differently: Inject 25-30 Units into the skin See admin instructions. Inject 30 units with breakfast & 28 units with dinner.)   Insulin Pen Needle (FIFTY50 PEN NEEDLES) 32G X 4 MM MISC 1 each by Misc.(Non-Drug; Combo Route) route daily.   insulin regular (NOVOLIN R) 100 units/mL injection Use as sliding scale up to 10 units tid with meals   ipratropium-albuterol (DUONEB) 0.5-2.5 (3) MG/3ML SOLN USE 1 VIAL IN NEBULIZER 4 TIMES DAILY   Lancets (ONETOUCH ULTRASOFT) lancets USE 1 LANCET TO CHECK GLUCOSE IN THE MORNING BEFORE BREAKFAST   losartan (COZAAR) 100 MG tablet Take 100 mg by mouth daily.    meclizine (ANTIVERT) 12.5 MG tablet Take 1 tablet (12.5 mg total) by mouth 3 (three) times daily as needed for dizziness.   methocarbamol (ROBAXIN) 500 MG tablet Take 500 mg by mouth 3 (three) times daily.   metoprolol succinate (TOPROL-XL) 100 MG 24 hr tablet Take by mouth.   pantoprazole sodium (PROTONIX) 40 mg Take 1 packet every day by oral route.   potassium chloride SA (K-DUR,KLOR-CON) 20 MEQ tablet Take 1 tablet (20 mEq total) by mouth 2 (two) times daily. (Patient taking differently: Take 20 mEq by mouth 3 (three) times daily. Take 1 tablet (20 meq) scheduled three times daily & takes an additional tablet at lunch on Tuesdays, Thursdays, & Saturdays due to lasix)   primidone (MYSOLINE) 50 MG tablet Take 100 mg by mouth at bedtime.    sildenafil (VIAGRA) 100  MG tablet Take 1 tablet by mouth daily as needed.   spironolactone (ALDACTONE) 25 MG tablet Take 25 mg by mouth daily.   tamsulosin (FLOMAX) 0.4 MG CAPS capsule Take 1 capsule (0.4 mg total) by mouth daily after breakfast.   tiZANidine (ZANAFLEX) 4 MG tablet Take 4 mg by mouth 3 (three)  times daily as needed for muscle spasms.    traMADol (ULTRAM) 50 MG tablet Take 50 mg by mouth daily as needed for moderate pain.    zolpidem (AMBIEN CR) 12.5 MG CR tablet Take 12.5 mg by mouth at bedtime as needed.   No facility-administered encounter medications on file as of 09/22/2022.    ALLERGIES:  No Known Allergies  LABORATORY DATA:  I have reviewed the labs as listed.  CBC    Component Value Date/Time   WBC 6.6 09/07/2022 1052   RBC 3.53 (L) 09/07/2022 1052   HGB 8.6 (L) 09/07/2022 1052   HGB 12.1 (L) 11/25/2021 1426   HGB 15.2 12/18/2016 0804   HCT 29.6 (L) 09/07/2022 1052   HCT 46.5 12/18/2016 0804   PLT 333 09/07/2022 1052   PLT 285 11/25/2021 1426   PLT 291 12/18/2016 0804   MCV 83.9 09/07/2022 1052   MCV 91 12/18/2016 0804   MCH 24.4 (L) 09/07/2022 1052   MCHC 29.1 (L) 09/07/2022 1052   RDW 18.6 (H) 09/07/2022 1052   RDW 14.7 12/18/2016 0804   LYMPHSABS 1.6 09/07/2022 1052   LYMPHSABS 2.0 12/18/2016 0804   MONOABS 0.4 09/07/2022 1052   EOSABS 0.2 09/07/2022 1052   EOSABS 0.2 12/18/2016 0804   BASOSABS 0.0 09/07/2022 1052   BASOSABS 0.0 12/18/2016 0804      Latest Ref Rng & Units 07/17/2022    1:17 PM 11/25/2021    2:26 PM 08/20/2021    2:09 PM  CMP  Glucose 70 - 99 mg/dL 146  119  224   BUN 8 - 23 mg/dL '12  15  15   '$ Creatinine 0.61 - 1.24 mg/dL 1.34  1.50  1.51   Sodium 135 - 145 mmol/L 135  135  134   Potassium 3.5 - 5.1 mmol/L 4.1  4.3  4.5   Chloride 98 - 111 mmol/L 104  103  100   CO2 22 - 32 mmol/L '25  24  27   '$ Calcium 8.9 - 10.3 mg/dL 9.8  10.1  10.6   Total Protein 6.5 - 8.1 g/dL 7.7  7.8  7.5   Total Bilirubin 0.3 - 1.2 mg/dL 0.3  0.4  0.4   Alkaline Phos 38 - 126 U/L 90  102  110   AST 15 - 41 U/L '18  20  15   '$ ALT 0 - 44 U/L '15  25  20     '$ DIAGNOSTIC IMAGING:  I have independently reviewed the relevant imaging and discussed with the patient.   WRAP UP:  All questions were answered. The patient knows to call the clinic with any  problems, questions or concerns.  Medical decision making: Moderate  Time spent on visit: I spent 20 minutes counseling the patient face to face. The total time spent in the appointment was 30 minutes and more than 50% was on counseling.  Harriett Rush, PA-C  09/22/22 2:06 PM

## 2022-09-22 ENCOUNTER — Inpatient Hospital Stay: Payer: Medicare Other

## 2022-09-22 ENCOUNTER — Inpatient Hospital Stay (HOSPITAL_BASED_OUTPATIENT_CLINIC_OR_DEPARTMENT_OTHER): Payer: Medicare Other | Admitting: Physician Assistant

## 2022-09-22 VITALS — BP 99/65 | HR 91 | Temp 97.3°F | Resp 19 | Ht 66.0 in | Wt 232.0 lb

## 2022-09-22 VITALS — BP 92/62 | HR 82 | Temp 97.1°F | Resp 18

## 2022-09-22 DIAGNOSIS — D472 Monoclonal gammopathy: Secondary | ICD-10-CM

## 2022-09-22 DIAGNOSIS — D509 Iron deficiency anemia, unspecified: Secondary | ICD-10-CM

## 2022-09-22 DIAGNOSIS — R6889 Other general symptoms and signs: Secondary | ICD-10-CM | POA: Diagnosis not present

## 2022-09-22 DIAGNOSIS — E8809 Other disorders of plasma-protein metabolism, not elsewhere classified: Secondary | ICD-10-CM | POA: Diagnosis not present

## 2022-09-22 DIAGNOSIS — D500 Iron deficiency anemia secondary to blood loss (chronic): Secondary | ICD-10-CM | POA: Diagnosis not present

## 2022-09-22 MED ORDER — SODIUM CHLORIDE 0.9 % IV SOLN
510.0000 mg | Freq: Once | INTRAVENOUS | Status: AC
  Administered 2022-09-22: 510 mg via INTRAVENOUS
  Filled 2022-09-22: qty 510

## 2022-09-22 MED ORDER — SODIUM CHLORIDE 0.9 % IV SOLN: Freq: Once | INTRAVENOUS | Status: AC

## 2022-09-22 MED ORDER — ACETAMINOPHEN 325 MG PO TABS
650.0000 mg | ORAL_TABLET | Freq: Once | ORAL | Status: AC
  Administered 2022-09-22: 650 mg via ORAL
  Filled 2022-09-22: qty 2

## 2022-09-22 MED ORDER — CETIRIZINE HCL 10 MG PO TABS
10.0000 mg | ORAL_TABLET | Freq: Once | ORAL | Status: AC
  Administered 2022-09-22: 10 mg via ORAL
  Filled 2022-09-22: qty 1

## 2022-09-22 NOTE — Patient Instructions (Signed)
James Zuniga at [place] **   You were seen today by Tarri Abernethy PA-C for your follow-up visit.    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 Will need to check additional tests to see if you have myeloma cancer. Bone marrow biopsy PET scan You will see Dr. Delton Coombes for follow-up visit 1 week after your bone marrow biopsy and PET scan have been completed  IRON DEFICIENCY ANEMIA Continue with IV iron as previously scheduled. We will work to get you set up with local gastroenterologist (GI doctor) to see if you have any bleeding from your stomach or intestines.  FOLLOW-UP APPOINTMENT: Office visit with Dr. Delton Coombes 1 week after bone marrow biopsy and PET scan  ** Thank you for trusting me with your healthcare!  I strive to provide all of my patients with quality care at each visit.  If you receive a survey for this visit, I would be so grateful to you for taking the time to provide feedback.  Thank you in advance!  ~ Fabrice Dyal                   Dr. Derek Jack   &   Tarri Abernethy, PA-C   - - - - - - - - - - - - - - - - - -    Thank you for choosing [place] at [facility] to provide your oncology and hematology care.  To afford each patient quality time with our provider, please arrive at least 15 minutes before your scheduled appointment time.   If you have a lab appointment with the [facility] please come in thru the Main Entrance and check in at the main information desk.  You need to re-schedule your appointment should you arrive 10 or more minutes late.  We strive to give you quality time with our providers, and arriving late affects you and other patients whose appointments are after yours.  Also, if  you no show three or more times for appointments you may be dismissed from the clinic at the providers discretion.     Again, thank you for choosing [facility].  Our hope is that these requests will decrease the amount of time that you wait before being seen by our physicians.       _____________________________________________________________  Should you have questions after your visit to [facility], please contact our office at [phone] and follow the prompts.  Our office hours are 8:00 a.m. and 4:30 p.m. Monday - Friday.  Please note that voicemails left after 4:00 p.m. may not be returned until the following business day.  We are closed weekends and major holidays.  You do have access to a nurse 24-7, just call the main number to the clinic [phone] and do not press any options, hold on the line and a nurse will answer the phone.    For prescription refill requests, have your pharmacy contact our office and allow 72 hours.

## 2022-09-22 NOTE — Progress Notes (Signed)
Pt presents today for Feraheme IV iron infusion per provider's order. Vital signs stable and pt voiced no new complaints at this time. ? ?Peripheral IV started with good blood return pre and post infusion. ? ?Feraheme IV given today per MD orders. Tolerated infusion without adverse affects. Vital signs stable. No complaints at this time. Discharged from clinic ambulatory in stable condition. Alert and oriented x 3. F/U with [facility] as scheduled.   ?

## 2022-09-22 NOTE — Patient Instructions (Signed)
[facility] [place]  Discharge Instructions: Thank you for choosing [facility] to provide your oncology and hematology care.  If you have a lab appointment with the Cancer Center, please come in thru the Main Entrance and check in at the main information desk.  Wear comfortable clothing and clothing appropriate for easy access to any Portacath or PICC line.   We strive to give you quality time with your provider. You may need to reschedule your appointment if you arrive late (15 or more minutes).  Arriving late affects you and other patients whose appointments are after yours.  Also, if you miss three or more appointments without notifying the office, you may be dismissed from the clinic at the provider's discretion.      For prescription refill requests, have your pharmacy contact our office and allow 72 hours for refills to be completed.    Today you received Feraheme IV iron infusion.     BELOW ARE SYMPTOMS THAT SHOULD BE REPORTED IMMEDIATELY: *FEVER GREATER THAN 100.4 F (38 C) OR HIGHER *CHILLS OR SWEATING *NAUSEA AND VOMITING THAT IS NOT CONTROLLED WITH YOUR NAUSEA MEDICATION *UNUSUAL SHORTNESS OF BREATH *UNUSUAL BRUISING OR BLEEDING *URINARY PROBLEMS (pain or burning when urinating, or frequent urination) *BOWEL PROBLEMS (unusual diarrhea, constipation, pain near the anus) TENDERNESS IN MOUTH AND THROAT WITH OR WITHOUT PRESENCE OF ULCERS (sore throat, sores in mouth, or a toothache) UNUSUAL RASH, SWELLING OR PAIN  UNUSUAL VAGINAL DISCHARGE OR ITCHING   Items with * indicate a potential emergency and should be followed up as soon as possible or go to the Emergency Department if any problems should occur.  Please show the CHEMOTHERAPY ALERT CARD or IMMUNOTHERAPY ALERT CARD at check-in to the Emergency Department and triage nurse.  Should you have questions after your visit or need to cancel or reschedule your appointment, please contact [facility] [place] [phone]  and follow the prompts.  Office hours are 8:00 a.m. to 4:30 p.m. Monday - Friday. Please note that voicemails left after 4:00 p.m. may not be returned until the following business day.  We are closed weekends and major holidays. You have access to a nurse at all times for urgent questions. Please call the main number to the clinic [phone] and follow the prompts.  For any non-urgent questions, you may also contact your provider using MyChart. We now offer e-Visits for anyone 18 and older to request care online for non-urgent symptoms. For details visit [email or web address].   Also download the MyChart app! Go to the app store, search "MyChart", open the app, select , and log in with your MyChart username and password.   

## 2022-09-29 ENCOUNTER — Inpatient Hospital Stay: Payer: Medicare Other

## 2022-09-29 VITALS — BP 91/71 | HR 73 | Temp 96.4°F | Resp 18

## 2022-09-29 DIAGNOSIS — D509 Iron deficiency anemia, unspecified: Secondary | ICD-10-CM

## 2022-09-29 DIAGNOSIS — D500 Iron deficiency anemia secondary to blood loss (chronic): Secondary | ICD-10-CM | POA: Diagnosis not present

## 2022-09-29 MED ORDER — CETIRIZINE HCL 10 MG PO TABS
10.0000 mg | ORAL_TABLET | Freq: Once | ORAL | Status: AC
  Administered 2022-09-29: 10 mg via ORAL
  Filled 2022-09-29: qty 1

## 2022-09-29 MED ORDER — SODIUM CHLORIDE 0.9 % IV SOLN: Freq: Once | INTRAVENOUS | Status: AC

## 2022-09-29 MED ORDER — SODIUM CHLORIDE 0.9 % IV SOLN
510.0000 mg | Freq: Once | INTRAVENOUS | Status: AC
  Administered 2022-09-29: 510 mg via INTRAVENOUS
  Filled 2022-09-29: qty 510

## 2022-09-29 MED ORDER — ACETAMINOPHEN 325 MG PO TABS
650.0000 mg | ORAL_TABLET | Freq: Once | ORAL | Status: AC
  Administered 2022-09-29: 650 mg via ORAL
  Filled 2022-09-29: qty 2

## 2022-09-29 NOTE — Patient Instructions (Signed)
[facility] [place]  Discharge Instructions: Thank you for choosing [facility] to provide your oncology and hematology care.  If you have a lab appointment with the Cancer Center, please come in thru the Main Entrance and check in at the main information desk.  Wear comfortable clothing and clothing appropriate for easy access to any Portacath or PICC line.   We strive to give you quality time with your provider. You may need to reschedule your appointment if you arrive late (15 or more minutes).  Arriving late affects you and other patients whose appointments are after yours.  Also, if you miss three or more appointments without notifying the office, you may be dismissed from the clinic at the provider's discretion.      For prescription refill requests, have your pharmacy contact our office and allow 72 hours for refills to be completed.    Today you received the following chemotherapy and/or immunotherapy agents Feraheme      To help prevent nausea and vomiting after your treatment, we encourage you to take your nausea medication as directed.  BELOW ARE SYMPTOMS THAT SHOULD BE REPORTED IMMEDIATELY: *FEVER GREATER THAN 100.4 F (38 C) OR HIGHER *CHILLS OR SWEATING *NAUSEA AND VOMITING THAT IS NOT CONTROLLED WITH YOUR NAUSEA MEDICATION *UNUSUAL SHORTNESS OF BREATH *UNUSUAL BRUISING OR BLEEDING *URINARY PROBLEMS (pain or burning when urinating, or frequent urination) *BOWEL PROBLEMS (unusual diarrhea, constipation, pain near the anus) TENDERNESS IN MOUTH AND THROAT WITH OR WITHOUT PRESENCE OF ULCERS (sore throat, sores in mouth, or a toothache) UNUSUAL RASH, SWELLING OR PAIN  UNUSUAL VAGINAL DISCHARGE OR ITCHING   Items with * indicate a potential emergency and should be followed up as soon as possible or go to the Emergency Department if any problems should occur.  Please show the CHEMOTHERAPY ALERT CARD or IMMUNOTHERAPY ALERT CARD at check-in to the  Emergency Department and triage nurse.  Should you have questions after your visit or need to cancel or reschedule your appointment, please contact [facility] [place] [phone]  and follow the prompts.  Office hours are 8:00 a.m. to 4:30 p.m. Monday - Friday. Please note that voicemails left after 4:00 p.m. may not be returned until the following business day.  We are closed weekends and major holidays. You have access to a nurse at all times for urgent questions. Please call the main number to the clinic [phone] and follow the prompts.  For any non-urgent questions, you may also contact your provider using MyChart. We now offer e-Visits for anyone 18 and older to request care online for non-urgent symptoms. For details visit [email or web address].   Also download the MyChart app! Go to the app store, search "MyChart", open the app, select [facility], and log in with your MyChart username and password.   

## 2022-09-29 NOTE — Progress Notes (Signed)
Patient presents today for Feraheme infusion per providers order.  Vital signs WNL.  Patient has no new complaints at this time.  Peripheral IV started and blood return noted pre and post infusion.  Stable during infusion without adverse affects.  Vital signs stable.  No complaints at this time.  Discharge from clinic ambulatory in stable condition.  Alert and oriented X 3.  Follow up with [facility] as scheduled.  

## 2022-10-01 ENCOUNTER — Other Ambulatory Visit (HOSPITAL_COMMUNITY): Payer: Medicare Other

## 2022-10-03 NOTE — Progress Notes (Unsigned)
Referring Provider: Konrad Saha  Primary Care Physician:  Berkley Harvey, NP Primary Gastroenterologist:  Dr. Gala Romney  No chief complaint on file.   HPI:   James Zuniga is a 67 y.o. male presenting today at the request of Pennington, Rebekah M, PA-C  for IDA.   Longstanding history of IDA, followed by hematology with IV iron infusions as needed.  Prior/recent baseline hemoglobin 11-12 range.  Hemoglobin down to 7.7 in January 2024. Labs on 09/07/2022 with ferritin 14, iron 27, saturation 7%, hemoglobin 8.6.  Hematology has stated, unclear if anemia solely due to iron deficiency, or due to bone marrow plasma cell disorder.   He is scheduled for bone marrow biopsy 10/06/2022 and PET scan 10/08/2022.  Due to persistent anemia with iron deficiency despite IV iron, he was referred back to GI for further evaluation.  EGD in February 2019 small antral hyperplastic polyp.  Colonoscopy February 2019 with normal exam.  Capsule endoscopy in September 2019 with 2 small AVMs in the proximal small bowel, otherwise suboptimal prep.  Small bowel enteroscopy November 2019 single gastric polyp resected and retrieved s/p clip placed, single nonbleeding angioectasia in the duodenum s/p APC, 3 nonbleeding angioectasias in the jejunum s/p APC.    Today:     History of hepatic hemangiomas.  Supposed to have follow-up MRI 3-6 months from last MRI in March 2015.  Chronic pancreatitis  Past Medical History:  Diagnosis Date   Arthritis    "all over" (08/24/2017)   CHF (congestive heart failure) (HCC)    Chronic airway obstruction (HCC)    Chronic lower back pain    Coronary artery disease    Disorder of liver    "spots on my liver; they've rechecked them; they haven't changed" (08/24/2017)   Esophageal reflux    Gout    "on daily RX" (08/24/2017)   Hyperlipidemia    Hypertension    Hypokalemia 11/2016   Hypotestosteronism 07/01/2013   Insomnia    Iron deficiency anemia, unspecified  07/01/2013   Memory loss    On home oxygen therapy    "2L prn" (08/24/2017)   OSA on CPAP    PVD (peripheral vascular disease) ([place])    Renal disorder    Stroke ([place]) 01/2017   "left sided weakness remains" (08/24/2017)   Type II diabetes mellitus ([place])    Vitamin D deficiency     Past Surgical History:  Procedure Laterality Date   COLONOSCOPY N/A 08/26/2017   Procedure: COLONOSCOPY;  Surgeon: Gatha Mayer, MD;  Location: [facility];  Service: Endoscopy;  Laterality: N/A;   ENTEROSCOPY N/A 05/24/2018   Procedure: ENTEROSCOPY;  Surgeon: Lavena Bullion, DO;  Location: [facility] ENDOSCOPY;  Service: Gastroenterology;  Laterality: N/A;   ESOPHAGOGASTRODUODENOSCOPY N/A 08/26/2017   Procedure: ESOPHAGOGASTRODUODENOSCOPY (EGD);  Surgeon: Gatha Mayer, MD;  Location[facility] ENDOSCOPY;  Service: Endoscopy;  Laterality: N/A;   HOT HEMOSTASIS N/A 05/24/2018   Procedure: HOT HEMOSTASIS (ARGON PLASMA COAGULATION/BICAP);  Surgeon: Lavena Bullion, DO;  Location: [facility] ENDOSCOPY;  Service: Gastroenterology;  Laterality: N/A;   KNEE ARTHROSCOPY Left    LOOP RECORDER IMPLANT  06/2017   POLYPECTOMY  05/24/2018   Procedure: POLYPECTOMY;  Surgeon: Lavena Bullion, DO;  Location: [facility] ENDOSCOPY;  Service: Gastroenterology;;   TEE WITHOUT CARDIOVERSION N/A 12/24/2016   Procedure: TRANSESOPHAGEAL ECHOCARDIOGRAM (TEE);  Surgeon: Fay Records, MD;  Location[facility] ENDOSCOPY;  Service: Cardiovascular;  Laterality: N/A;    Current Outpatient Medications  Medication Sig Dispense  Refill   albuterol (VENTOLIN HFA) 108 (90 Base) MCG/ACT inhaler Inhale 2 puffs into the lungs every 6 (six) hours as needed for wheezing or shortness of breath. 18 g 3   allopurinol (ZYLOPRIM) 300 MG tablet Take 300 mg by mouth daily.     atorvastatin (LIPITOR) 80 MG tablet Take 1 tablet (80 mg total) by mouth every morning. 30 tablet 0   atorvastatin (LIPITOR) 80 MG tablet Take 1 tablet by mouth daily.     Blood Glucose Monitoring Suppl  (FIFTY50 GLUCOSE METER 2.0) w/Device KIT To check blood sugars TID or Use as instructed Include strips. Lancets, lancet device, control solution. batteries     clopidogrel (PLAVIX) 75 MG tablet Take 1 tablet (75 mg total) by mouth daily. 30 tablet 2   colchicine 0.6 MG tablet Take 0.6 mg by mouth.     Cyanocobalamin (VITAMIN B-12 IJ) Inject 1,000 mg as directed every 30 (thirty) days. X 4     diclofenac sodium (VOLTAREN) 1 % GEL      diclofenac Sodium (VOLTAREN) 1 % GEL SMARTSIG:4 Gram(s) Topical 3 Times Daily PRN     DULoxetine (CYMBALTA) 60 MG capsule Take 60 mg by mouth 2 (two) times daily.      fluticasone (FLONASE) 50 MCG/ACT nasal spray Place 1 spray into both nostrils daily. 16 g 2   fluticasone furoate-vilanterol (BREO ELLIPTA) 200-25 MCG/ACT AEPB Inhale 1 puff into the lungs daily. 1 each 5   furosemide (LASIX) 20 MG tablet Take 20 mg by mouth every Tuesday, Thursday, and Saturday at 6 PM.      gabapentin (NEURONTIN) 300 MG capsule Take 300 mg by mouth 4 (four) times daily.      glucose blood test strip 1 each by Misc.(Non-Drug; Combo Route) route daily.     Insulin Glargine (TOUJEO SOLOSTAR [place]) Inject 70 Units into the skin at bedtime.     insulin NPH-regular Human (NOVOLIN 70/30) (70-30) 100 UNIT/ML injection Please inject 25 units twice daily before breakfast and dinner. (Patient taking differently: Inject 25-30 Units into the skin See admin instructions. Inject 30 units with breakfast & 28 units with dinner.) 10 mL 3   Insulin Pen Needle (FIFTY50 PEN NEEDLES) 32G X 4 MM MISC 1 each by Misc.(Non-Drug; Combo Route) route daily.     insulin regular (NOVOLIN R) 100 units/mL injection Use as sliding scale up to 10 units tid with meals     ipratropium-albuterol (DUONEB) 0.5-2.5 (3) MG/3ML SOLN USE 1 VIAL IN NEBULIZER 4 TIMES DAILY 360 mL 5   Lancets (ONETOUCH ULTRASOFT) lancets USE 1 LANCET TO CHECK GLUCOSE IN THE MORNING BEFORE BREAKFAST  11   losartan (COZAAR) 100 MG tablet Take 100 mg by  mouth daily.      meclizine (ANTIVERT) 12.5 MG tablet Take 1 tablet (12.5 mg total) by mouth 3 (three) times daily as needed for dizziness. 20 tablet 0   methocarbamol (ROBAXIN) 500 MG tablet Take 500 mg by mouth 3 (three) times daily.     metoprolol succinate (TOPROL-XL) 100 MG 24 hr tablet Take by mouth.     pantoprazole sodium (PROTONIX) 40 mg Take 1 packet every day by oral route.     potassium chloride SA (K-DUR,KLOR-CON) 20 MEQ tablet Take 1 tablet (20 mEq total) by mouth 2 (two) times daily. (Patient taking differently: Take 20 mEq by mouth 3 (three) times daily. Take 1 tablet (20 meq) scheduled three times daily & takes an additional tablet at lunch on Tuesdays, Thursdays, & Saturdays  due to lasix) 30 tablet 3   primidone (MYSOLINE) 50 MG tablet Take 100 mg by mouth at bedtime.      sildenafil (VIAGRA) 100 MG tablet Take 1 tablet by mouth daily as needed.     spironolactone (ALDACTONE) 25 MG tablet Take 25 mg by mouth daily.     tamsulosin (FLOMAX) 0.4 MG CAPS capsule Take 1 capsule (0.4 mg total) by mouth daily after breakfast. 30 capsule 12   tiZANidine (ZANAFLEX) 4 MG tablet Take 4 mg by mouth 3 (three) times daily as needed for muscle spasms.   0   traMADol (ULTRAM) 50 MG tablet Take 50 mg by mouth daily as needed for moderate pain.      zolpidem (AMBIEN CR) 12.5 MG CR tablet Take 12.5 mg by mouth at bedtime as needed.     No current facility-administered medications for this visit.    Allergies as of 10/05/2022   (No Known Allergies)    Family History  Problem Relation Age of Onset   Heart disease Mother    Diabetes Father    Stroke Brother    Stroke Paternal Uncle    Diabetes Sister    Heart attack Brother    Heart attack Brother     Social History   Socioeconomic History   Marital status: Married    Spouse name: Not on file   Number of children: Not on file   Years of education: Not on file   Highest education level: Not on file  Occupational History    Occupation: diabled    Comment: as of early 1990s  Tobacco Use   Smoking status: Former    Packs/day: 0.50    Years: 40.00    Additional pack years: 0.00    Total pack years: 20.00    Types: Cigarettes    Start date: 08/05/1983    Quit date: 08/15/2017    Years since quitting: 5.1   Smokeless tobacco: Never  Vaping Use   Vaping Use: Never used  Substance and Sexual Activity   Alcohol use: No    Alcohol/week: 0.0 standard drinks of alcohol   Drug use: No   Sexual activity: Not Currently  Other Topics Concern   Not on file  Social History Narrative   Not on file   Social Determinants of Health   Financial Resource Strain: Not on file  Food Insecurity: No Food Insecurity (07/17/2022)   Hunger Vital Sign    Worried About Running Out of Food in the Last Year: Never true    [place] in the Last Year: Never true  Transportation Needs: No Transportation Needs (07/17/2022)   PRAPARE - Hydrologist (Medical): No    Lack of Transportation (Non-Medical): No  Physical Activity: Not on file  Stress: Not on file  Social Connections: Not on file  Intimate Partner Violence: Not At Risk (07/17/2022)   Humiliation, Afraid, Rape, and Kick questionnaire    Fear of Current or Ex-Partner: No    Emotionally Abused: No    Physically Abused: No    Sexually Abused: No    Review of Systems: Gen: Denies any fever, chills, fatigue, weight loss, lack of appetite.  CV: Denies chest pain, heart palpitations, peripheral edema, syncope.  Resp: Denies shortness of breath at rest or with exertion. Denies wheezing or cough.  GI: Denies dysphagia or odynophagia. Denies jaundice, hematemesis, fecal incontinence. GU : Denies urinary burning, urinary frequency, urinary hesitancy MS: Denies joint  pain, muscle weakness, cramps, or limitation of movement.  Derm: Denies rash, itching, dry skin Psych: Denies depression, anxiety, memory loss, and confusion Heme: Denies bruising,  bleeding, and enlarged lymph nodes.  Physical Exam: There were no vitals taken for this visit. General:   Alert and oriented. Pleasant and cooperative. Well-nourished and well-developed.  Head:  Normocephalic and atraumatic. Eyes:  Without icterus, sclera clear and conjunctiva pink.  Ears:  Normal auditory acuity. Lungs:  Clear to auscultation bilaterally. No wheezes, rales, or rhonchi. No distress.  Heart:  S1, S2 present without murmurs appreciated.  Abdomen:  +BS, soft, non-tender and non-distended. No HSM noted. No guarding or rebound. No masses appreciated.  Rectal:  Deferred  Msk:  Symmetrical without gross deformities. Normal posture. Extremities:  Without edema. Neurologic:  Alert and  oriented x4;  grossly normal neurologically. Skin:  Intact without significant lesions or rashes. Psych:  Alert and cooperative. Normal mood and affect.    Assessment:     Plan:  ***   Aliene Altes, PA-C [facility] 10/05/2022

## 2022-10-05 ENCOUNTER — Encounter: Payer: Self-pay | Admitting: Gastroenterology

## 2022-10-05 ENCOUNTER — Ambulatory Visit: Payer: Medicare Other | Admitting: Physician Assistant

## 2022-10-05 ENCOUNTER — Ambulatory Visit: Payer: Medicare Other | Admitting: Gastroenterology

## 2022-10-05 ENCOUNTER — Other Ambulatory Visit (HOSPITAL_COMMUNITY)
Admission: RE | Admit: 2022-10-05 | Discharge: 2022-10-05 | Disposition: A | Discharge status: Home / Self Care | Payer: Medicare Other | Source: Ambulatory Visit | Attending: Gastroenterology | Admitting: Gastroenterology

## 2022-10-05 ENCOUNTER — Other Ambulatory Visit: Payer: Self-pay | Admitting: Radiology

## 2022-10-05 VITALS — BP 115/71 | HR 73 | Temp 97.9°F | Ht 66.0 in | Wt 239.4 lb

## 2022-10-05 DIAGNOSIS — K769 Liver disease, unspecified: Secondary | ICD-10-CM | POA: Diagnosis not present

## 2022-10-05 DIAGNOSIS — D510 Vitamin B12 deficiency anemia due to intrinsic factor deficiency: Secondary | ICD-10-CM | POA: Diagnosis present

## 2022-10-05 DIAGNOSIS — D472 Monoclonal gammopathy: Secondary | ICD-10-CM

## 2022-10-05 DIAGNOSIS — D500 Iron deficiency anemia secondary to blood loss (chronic): Secondary | ICD-10-CM

## 2022-10-05 DIAGNOSIS — D509 Iron deficiency anemia, unspecified: Secondary | ICD-10-CM | POA: Diagnosis not present

## 2022-10-05 LAB — CBC WITH DIFFERENTIAL/PLATELET
Abs Immature Granulocytes: 0.01 10*3/uL (ref 0.00–0.07)
Basophils Absolute: 0 10*3/uL (ref 0.0–0.1)
Basophils Relative: 0 %
Eosinophils Absolute: 0.2 10*3/uL (ref 0.0–0.5)
Eosinophils Relative: 4 %
HCT: 32.6 % — ABNORMAL LOW (ref 39.0–52.0)
Hemoglobin: 9.6 g/dL — ABNORMAL LOW (ref 13.0–17.0)
Immature Granulocytes: 0 %
Lymphocytes Relative: 21 %
Lymphs Abs: 1.2 10*3/uL (ref 0.7–4.0)
MCH: 26.4 pg (ref 26.0–34.0)
MCHC: 29.4 g/dL — ABNORMAL LOW (ref 30.0–36.0)
MCV: 89.8 fL (ref 80.0–100.0)
Monocytes Absolute: 0.4 10*3/uL (ref 0.1–1.0)
Monocytes Relative: 6 %
Neutro Abs: 4 10*3/uL (ref 1.7–7.7)
Neutrophils Relative %: 69 %
Platelets: 265 10*3/uL (ref 150–400)
RBC: 3.63 MIL/uL — ABNORMAL LOW (ref 4.22–5.81)
RDW: 22.4 % — ABNORMAL HIGH (ref 11.5–15.5)
WBC: 5.8 10*3/uL (ref 4.0–10.5)
nRBC: 0 % (ref 0.0–0.2)

## 2022-10-05 LAB — IRON AND TIBC
Iron: 72 ug/dL (ref 45–182)
Saturation Ratios: 25 % (ref 17.9–39.5)
TIBC: 287 ug/dL (ref 250–450)
UIBC: 215 ug/dL

## 2022-10-05 LAB — FERRITIN: Ferritin: 392 ng/mL — ABNORMAL HIGH (ref 24–336)

## 2022-10-05 NOTE — H&P (Signed)
Referring Physician(s): Katragadda,S  Supervising Physician: Aletta Edouard  Patient Status:  [facility]  Chief Complaint:  "I'm here for a bone marrow biopsy"  Subjective: Pt known to IR team from right liver mass biopsy in 2014. 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 He is scheduled today for image guided bone marrow biopsy to rule out myeloma. He denies fever,HA,CP,dyspnea, cough, abd pain, N/V or bleeding. He does have back pain.     Past Medical History:  Diagnosis Date   Arthritis    "all over" (08/24/2017)   CHF (congestive heart failure) (HCC)    Chronic airway obstruction (HCC)    Chronic lower back pain    Coronary artery disease    Disorder of liver    "spots on my liver; they've rechecked them; they haven't changed" (08/24/2017)   Esophageal reflux    Gout    "on daily RX" (08/24/2017)   Hyperlipidemia    Hypertension    Hypokalemia 11/2016   Hypotestosteronism 07/01/2013   Insomnia    Iron deficiency anemia, unspecified 07/01/2013   Memory loss    On home oxygen therapy    "2L prn" (08/24/2017)   OSA on CPAP    PVD (peripheral vascular disease) ([place])    Renal disorder    Stroke ([place]) 01/2017   "left sided weakness remains" (08/24/2017)   Type II diabetes mellitus ([place])    Vitamin D deficiency    Past Surgical History:  Procedure Laterality Date   COLONOSCOPY N/A 08/26/2017   Procedure: COLONOSCOPY;  Surgeon: Gatha Mayer, MD;  Location: [place];  Service: Endoscopy;  Laterality: N/A;   ENTEROSCOPY N/A 05/24/2018   Procedure: ENTEROSCOPY;  Surgeon: Lavena Bullion, DO;  Location: [facility];  Service: Gastroenterology;  Laterality: N/A;   ESOPHAGOGASTRODUODENOSCOPY N/A 08/26/2017   Procedure: ESOPHAGOGASTRODUODENOSCOPY (EGD);   Surgeon: Gatha Mayer, MD;  Location: [facility]COPY;  Service: Endoscopy;  Laterality: N/A;   HOT HEMOSTASIS N/A 05/24/2018   Procedure: HOT HEMOSTASIS (ARGON PLASMA COAGULATION/BICAP);  Surgeon: Lavena Bullion, DO;  Location: [facility];  Service: Gastroenterology;  Laterality: N/A;   KNEE ARTHROSCOPY Left    LOOP RECORDER IMPLANT  06/2017   POLYPECTOMY  05/24/2018   Procedure: POLYPECTOMY;  Surgeon: Lavena Bullion, DO;  Location: [facility];  Service: Gastroenterology;;   TEE WITHOUT CARDIOVERSION N/A 12/24/2016   Procedure: TRANSESOPHAGEAL ECHOCARDIOGRAM (TEE);  Surgeon: Fay Records, MD;  Location: [facility] ENDOSCOPY;  Service: Cardiovascular;  Laterality: N/A;      Allergies: Patient has no known allergies.  Medications: Prior to Admission medications   Medication Sig Start Date End Date Taking? Authorizing Provider  albuterol (VENTOLIN HFA) 108 (90 Base) MCG/ACT inhaler Inhale 2 puffs into the lungs every 6 (six) hours as needed for wheezing or shortness of breath. 09/08/19   Chesley Mires, MD  allopurinol (ZYLOPRIM) 300 MG tablet Take 300 mg by mouth daily.    [provider]  aspirin EC 81 MG tablet Take 81 mg by mouth daily. Swallow whole.    [provider]  atorvastatin (LIPITOR) 80 MG tablet Take 1 tablet (80 mg total) by mouth every morning. 12/25/16   Alphonzo Grieve, MD  atorvastatin (LIPITOR) 80 MG tablet Take 1 tablet by mouth daily. 04/07/21   [provider]  Blood Glucose Monitoring Suppl (FIFTY50 GLUCOSE METER 2.0) w/Device KIT To check blood sugars TID or Use as instructed Include strips. Lancets, lancet device, control solution. batteries 04/11/19   [provider]  clopidogrel (PLAVIX) 75 MG tablet Take 1 tablet (75 mg total) by mouth daily. 12/26/16   Alphonzo Grieve, MD  colchicine 0.6 MG tablet Take 0.6 mg by mouth. 03/04/21   [provider]  Cyanocobalamin (VITAMIN B-12 IJ) Inject 1,000 mg as directed every 30  (thirty) days. X 4    [provider]  diclofenac sodium (VOLTAREN) 1 % GEL  10/31/18   [provider]  diclofenac Sodium (VOLTAREN) 1 % GEL SMARTSIG:4 Gram(s) Topical 3 Times Daily PRN 04/07/21   [provider]  DULoxetine (CYMBALTA) 60 MG capsule Take 60 mg by mouth 2 (two) times daily.     [provider]  fluticasone (FLONASE) 50 MCG/ACT nasal spray Place 1 spray into both nostrils daily. 10/28/21   Chesley Mires, MD  fluticasone furoate-vilanterol (BREO ELLIPTA) 200-25 MCG/ACT AEPB Inhale 1 puff into the lungs daily. 10/28/21   Chesley Mires, MD  furosemide (LASIX) 20 MG tablet Take 20 mg by mouth every Tuesday, Thursday, and Saturday at 6 PM.  03/21/13   [provider]  gabapentin (NEURONTIN) 300 MG capsule Take 300 mg by mouth 4 (four) times daily.  10/18/14   [provider]  glucose blood test strip 1 each by Misc.(Non-Drug; Combo Route) route daily. 05/26/17   [provider]  Insulin Glargine (TOUJEO SOLOSTAR ) Inject 70 Units into the skin at bedtime. 04/28/21   [provider]  insulin NPH-regular Human (NOVOLIN 70/30) (70-30) 100 UNIT/ML injection Please inject 25 units twice daily before breakfast and dinner. Patient taking differently: Inject 25-30 Units into the skin See admin instructions. Inject 30 units with breakfast & 28 units with dinner. 08/26/17   Lorella Nimrod, MD  Insulin Pen Needle (FIFTY50 PEN NEEDLES) 32G X 4 MM MISC 1 each by Misc.(Non-Drug; Combo Route) route daily. 04/18/19   [provider]  insulin regular (NOVOLIN R) 100 units/mL injection Use as sliding scale up to 10 units tid with meals 08/25/19   [provider]  ipratropium-albuterol (DUONEB) 0.5-2.5 (3) MG/3ML SOLN USE 1 VIAL IN NEBULIZER 4 TIMES DAILY 05/22/21   Chesley Mires, MD  Lancets (ONETOUCH ULTRASOFT) lancets USE 1 LANCET TO CHECK GLUCOSE IN THE MORNING BEFORE BREAKFAST 03/28/18   [provider]  losartan  (COZAAR) 100 MG tablet Take 100 mg by mouth daily.  Patient not taking: Reported on 10/05/2022 10/15/16   [provider]  meclizine (ANTIVERT) 12.5 MG tablet Take 1 tablet (12.5 mg total) by mouth 3 (three) times daily as needed for dizziness. 07/23/21   Drenda Freeze, MD  methocarbamol (ROBAXIN) 500 MG tablet Take 500 mg by mouth 3 (three) times daily. 04/09/21   [provider]  metoprolol succinate (TOPROL-XL) 100 MG 24 hr tablet Take by mouth. 07/01/22   [provider]  pantoprazole sodium (PROTONIX) 40 mg Take 1 packet every day by oral route.    [provider]  potassium chloride SA (K-DUR,KLOR-CON) 20 MEQ tablet Take 1 tablet (20 mEq total) by mouth 2 (two) times daily. Patient taking differently: Take 20 mEq by mouth 3 (three) times daily. Take 1 tablet (20 meq) scheduled three times daily & takes an additional tablet at lunch on Tuesdays, Thursdays, & Saturdays due to lasix 05/03/15   Volanda Napoleon, MD  primidone (MYSOLINE) 50  MG tablet Take 100 mg by mouth at bedtime.  01/26/17   [provider]  sildenafil (VIAGRA) 100 MG tablet Take 1 tablet by mouth daily as needed. 04/09/21   [provider]  spironolactone (ALDACTONE) 25 MG tablet Take 25 mg by mouth daily. Mon, wed, fri is when he takes this medication 08/18/19   [provider]  tamsulosin (FLOMAX) 0.4 MG CAPS capsule Take 1 capsule (0.4 mg total) by mouth daily after breakfast. 12/06/15   Volanda Napoleon, MD  tiZANidine (ZANAFLEX) 4 MG tablet Take 4 mg by mouth 3 (three) times daily as needed for muscle spasms.  10/04/14   [provider]  traMADol (ULTRAM) 50 MG tablet Take 50 mg by mouth daily as needed for moderate pain.  09/08/13   [provider]  zolpidem (AMBIEN CR) 12.5 MG CR tablet Take 12.5 mg by mouth at bedtime as needed. 05/12/21   [provider]     Vital Signs: temp 97.7  BP 129/84  R 18  O2 SATS 99% RA    Code Status:  FULL CODE  Physical Exam: awake/alert; chest- CTA bilat; heart- RRR; abd- obese, soft,+BS,NT; trace bilat pretibial edema  Imaging: No results found.  Labs:  CBC: Recent Labs    11/25/21 1426 07/17/22 1317 09/07/22 1052 10/05/22 1215  WBC 15.7* 7.6 6.6 5.8  HGB 12.1* 7.7* 8.6* 9.6*  HCT 39.2 27.8* 29.6* 32.6*  PLT 285 353 333 265    COAGS: No results for input(s): "INR", "APTT" in the last 8760 hours.  BMP: Recent Labs    11/25/21 1426 07/17/22 1317  NA 135 135  K 4.3 4.1  CL 103 104  CO2 24 25  GLUCOSE 119* 146*  BUN 15 12  CALCIUM 10.1 9.8  CREATININE 1.50* 1.34*  GFRNONAA 51* 58*    LIVER FUNCTION TESTS: Recent Labs    11/25/21 1426 07/17/22 1317  BILITOT 0.4 0.3  AST 20 18  ALT 25 15  ALKPHOS 102 90  PROT 7.8 7.7  ALBUMIN 3.7 3.3*    Assessment and Plan: Pt known to IR team from right liver mass biopsy in 2014. 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 He is scheduled today for image guided bone marrow biopsy to rule out myeloma. Risks and benefits of procedure was discussed with the patient /spouse including, but not limited to bleeding, infection, damage to adjacent structures or low yield requiring additional tests.  All of the questions were answered and there is agreement to proceed.  Consent signed and in chart.    Electronically Signed: D. Rowe Robert, PA-C 10/05/2022, 2:18 PM   I spent a total of 20 minutes at the the patient's bedside AND on the patient's hospital floor or unit, greater than 50% of which was counseling/coordinating care for image guided bone marrow biopsy

## 2022-10-05 NOTE — Patient Instructions (Addendum)
We will arrange you to have an upper endoscopy and colonoscopy in the near future with Dr. Gala Romney at [facility]. 1 day prior to procedure: Take one half dose of Toujeo (35 units) at bedtime.  You may continue to use your sliding scale insulin as needed. Day of your procedure: Do not take any morning diabetes medications.  We will arrange for her to have a CT of your abdomen to follow-up on the lesion on your liver.  Please have blood work completed at [facility].  Will follow-up with you in the office after your procedures.  It was very nice to meet you today!   I hope you enjoy your trip in May!  James Altes, PA-C [facility]

## 2022-10-06 ENCOUNTER — Other Ambulatory Visit: Payer: Self-pay | Admitting: *Deleted

## 2022-10-06 ENCOUNTER — Encounter (HOSPITAL_COMMUNITY): Payer: Self-pay

## 2022-10-06 ENCOUNTER — Ambulatory Visit (HOSPITAL_COMMUNITY)
Admission: RE | Admit: 2022-10-06 | Discharge: 2022-10-06 | Disposition: A | Discharge status: Home / Self Care | Payer: Medicare Other | Source: Ambulatory Visit | Attending: Physician Assistant | Admitting: Physician Assistant

## 2022-10-06 ENCOUNTER — Encounter: Payer: Self-pay | Admitting: *Deleted

## 2022-10-06 ENCOUNTER — Telehealth: Payer: Self-pay | Admitting: *Deleted

## 2022-10-06 ENCOUNTER — Ambulatory Visit (HOSPITAL_COMMUNITY)
Admission: RE | Admit: 2022-10-06 | Discharge: 2022-10-06 | Disposition: A | Discharge status: Home / Self Care | Payer: Medicare Other | Source: Ambulatory Visit | Attending: Radiology | Admitting: Radiology

## 2022-10-06 ENCOUNTER — Other Ambulatory Visit: Payer: Self-pay | Admitting: Physician Assistant

## 2022-10-06 DIAGNOSIS — E1140 Type 2 diabetes mellitus with diabetic neuropathy, unspecified: Secondary | ICD-10-CM | POA: Diagnosis not present

## 2022-10-06 DIAGNOSIS — G4733 Obstructive sleep apnea (adult) (pediatric): Secondary | ICD-10-CM | POA: Insufficient documentation

## 2022-10-06 DIAGNOSIS — D472 Monoclonal gammopathy: Secondary | ICD-10-CM

## 2022-10-06 DIAGNOSIS — R6889 Other general symptoms and signs: Secondary | ICD-10-CM

## 2022-10-06 DIAGNOSIS — E1122 Type 2 diabetes mellitus with diabetic chronic kidney disease: Secondary | ICD-10-CM | POA: Insufficient documentation

## 2022-10-06 DIAGNOSIS — E1151 Type 2 diabetes mellitus with diabetic peripheral angiopathy without gangrene: Secondary | ICD-10-CM | POA: Diagnosis not present

## 2022-10-06 DIAGNOSIS — Z794 Long term (current) use of insulin: Secondary | ICD-10-CM | POA: Insufficient documentation

## 2022-10-06 DIAGNOSIS — E8809 Other disorders of plasma-protein metabolism, not elsewhere classified: Secondary | ICD-10-CM

## 2022-10-06 DIAGNOSIS — I2510 Atherosclerotic heart disease of native coronary artery without angina pectoris: Secondary | ICD-10-CM | POA: Insufficient documentation

## 2022-10-06 DIAGNOSIS — E785 Hyperlipidemia, unspecified: Secondary | ICD-10-CM | POA: Insufficient documentation

## 2022-10-06 DIAGNOSIS — I130 Hypertensive heart and chronic kidney disease with heart failure and stage 1 through stage 4 chronic kidney disease, or unspecified chronic kidney disease: Secondary | ICD-10-CM | POA: Diagnosis not present

## 2022-10-06 DIAGNOSIS — Z1379 Encounter for other screening for genetic and chromosomal anomalies: Secondary | ICD-10-CM | POA: Diagnosis not present

## 2022-10-06 DIAGNOSIS — N189 Chronic kidney disease, unspecified: Secondary | ICD-10-CM | POA: Diagnosis not present

## 2022-10-06 DIAGNOSIS — I69351 Hemiplegia and hemiparesis following cerebral infarction affecting right dominant side: Secondary | ICD-10-CM | POA: Diagnosis not present

## 2022-10-06 DIAGNOSIS — K219 Gastro-esophageal reflux disease without esophagitis: Secondary | ICD-10-CM | POA: Diagnosis not present

## 2022-10-06 DIAGNOSIS — D509 Iron deficiency anemia, unspecified: Secondary | ICD-10-CM | POA: Diagnosis not present

## 2022-10-06 DIAGNOSIS — I509 Heart failure, unspecified: Secondary | ICD-10-CM | POA: Diagnosis not present

## 2022-10-06 HISTORY — DX: Dyspnea, unspecified: R06.00

## 2022-10-06 LAB — CBC WITH DIFFERENTIAL/PLATELET
Abs Immature Granulocytes: 0.03 10*3/uL (ref 0.00–0.07)
Basophils Absolute: 0 10*3/uL (ref 0.0–0.1)
Basophils Relative: 1 %
Eosinophils Absolute: 0.3 10*3/uL (ref 0.0–0.5)
Eosinophils Relative: 4 %
HCT: 33.8 % — ABNORMAL LOW (ref 39.0–52.0)
Hemoglobin: 9.6 g/dL — ABNORMAL LOW (ref 13.0–17.0)
Immature Granulocytes: 1 %
Lymphocytes Relative: 22 %
Lymphs Abs: 1.3 10*3/uL (ref 0.7–4.0)
MCH: 25.7 pg — ABNORMAL LOW (ref 26.0–34.0)
MCHC: 28.4 g/dL — ABNORMAL LOW (ref 30.0–36.0)
MCV: 90.4 fL (ref 80.0–100.0)
Monocytes Absolute: 0.5 10*3/uL (ref 0.1–1.0)
Monocytes Relative: 8 %
Neutro Abs: 4 10*3/uL (ref 1.7–7.7)
Neutrophils Relative %: 64 %
Platelets: 265 10*3/uL (ref 150–400)
RBC: 3.74 MIL/uL — ABNORMAL LOW (ref 4.22–5.81)
RDW: 22.2 % — ABNORMAL HIGH (ref 11.5–15.5)
WBC: 6.2 10*3/uL (ref 4.0–10.5)
nRBC: 0 % (ref 0.0–0.2)

## 2022-10-06 LAB — GLUCOSE, CAPILLARY: Glucose-Capillary: 166 mg/dL — ABNORMAL HIGH (ref 70–99)

## 2022-10-06 MED ORDER — NALOXONE HCL 0.4 MG/ML IJ SOLN
INTRAMUSCULAR | Status: AC
  Filled 2022-10-06: qty 1

## 2022-10-06 MED ORDER — PEG 3350-KCL-NA BICARB-NACL 420 G PO SOLR: 4000.0000 mL | Freq: Once | ORAL | 0 refills | Status: AC

## 2022-10-06 MED ORDER — MIDAZOLAM HCL 2 MG/2ML IJ SOLN
INTRAMUSCULAR | Status: AC | PRN
  Administered 2022-10-06 (×3): 1 mg via INTRAVENOUS

## 2022-10-06 MED ORDER — LIDOCAINE HCL (PF) 1 % IJ SOLN
INTRAMUSCULAR | Status: AC
  Filled 2022-10-06: qty 30

## 2022-10-06 MED ORDER — FLUMAZENIL 0.5 MG/5ML IV SOLN
INTRAVENOUS | Status: AC
  Filled 2022-10-06: qty 5

## 2022-10-06 MED ORDER — SODIUM CHLORIDE 0.9 % IV SOLN: INTRAVENOUS | Status: DC

## 2022-10-06 MED ORDER — MIDAZOLAM HCL 2 MG/2ML IJ SOLN
INTRAMUSCULAR | Status: AC
  Filled 2022-10-06: qty 2

## 2022-10-06 MED ORDER — FENTANYL CITRATE (PF) 100 MCG/2ML IJ SOLN
INTRAMUSCULAR | Status: AC
  Filled 2022-10-06: qty 2

## 2022-10-06 MED ORDER — FENTANYL CITRATE (PF) 100 MCG/2ML IJ SOLN
INTRAMUSCULAR | Status: AC | PRN
  Administered 2022-10-06 (×3): 50 ug via INTRAVENOUS

## 2022-10-06 NOTE — Sedation Documentation (Signed)
Unable to complete procedure in IR space. Bone marrow aborted. Will move to CT in attempts to complete procedure. Patient traveling with RN and monitor.

## 2022-10-06 NOTE — Discharge Instructions (Addendum)
IF ANY QUESTIONS OR CONCERNS CAN CONTACT INTERVENTIONAL RADIOLOGY CLINIC AT [phone]

## 2022-10-06 NOTE — Procedures (Signed)
Interventional Radiology Procedure Note  Procedure: CT guided bone marrow aspiration and biopsy  Complications: None  EBL: < 10 mL  Findings: Aspirate and core biopsy performed of bone marrow in right iliac bone.  Plan: Bedrest supine x 1 hrs  Jaja Switalski T. Braxton Vantrease, M.D Pager:  [phone]   

## 2022-10-06 NOTE — Sedation Documentation (Signed)
Patient transported to CT on monitor 

## 2022-10-06 NOTE — Telephone Encounter (Signed)
[employer] PA FOR CT: Case Number: [id number] Review Date: 10/06/2022 9:52:46 AM Expiration Date: N/A Status: This member's benefit plan did not require a prior authorization for this request.

## 2022-10-08 ENCOUNTER — Encounter (HOSPITAL_COMMUNITY)
Admission: RE | Admit: 2022-10-08 | Discharge: 2022-10-08 | Disposition: A | Discharge status: Home / Self Care | Payer: Medicare Other | Source: Ambulatory Visit | Attending: Physician Assistant | Admitting: Physician Assistant

## 2022-10-08 DIAGNOSIS — D472 Monoclonal gammopathy: Secondary | ICD-10-CM

## 2022-10-08 DIAGNOSIS — R6889 Other general symptoms and signs: Secondary | ICD-10-CM

## 2022-10-08 DIAGNOSIS — E8809 Other disorders of plasma-protein metabolism, not elsewhere classified: Secondary | ICD-10-CM | POA: Diagnosis present

## 2022-10-08 LAB — SURGICAL PATHOLOGY

## 2022-10-08 MED ORDER — FLUDEOXYGLUCOSE F - 18 (FDG) INJECTION
12.9100 | Freq: Once | INTRAVENOUS | Status: AC | PRN
  Administered 2022-10-08: 12.91 via INTRAVENOUS

## 2022-10-14 ENCOUNTER — Encounter: Payer: Self-pay | Admitting: *Deleted

## 2022-10-14 ENCOUNTER — Encounter (HOSPITAL_COMMUNITY): Payer: Self-pay

## 2022-10-19 ENCOUNTER — Encounter (HOSPITAL_COMMUNITY): Payer: Self-pay | Admitting: Physician Assistant

## 2022-10-19 ENCOUNTER — Inpatient Hospital Stay: Payer: Medicare Other | Attending: Hematology | Admitting: Hematology

## 2022-10-19 VITALS — BP 114/76 | HR 85 | Temp 98.4°F | Resp 16 | Wt 233.0 lb

## 2022-10-19 DIAGNOSIS — N189 Chronic kidney disease, unspecified: Secondary | ICD-10-CM | POA: Insufficient documentation

## 2022-10-19 DIAGNOSIS — Z809 Family history of malignant neoplasm, unspecified: Secondary | ICD-10-CM | POA: Insufficient documentation

## 2022-10-19 DIAGNOSIS — D472 Monoclonal gammopathy: Secondary | ICD-10-CM | POA: Insufficient documentation

## 2022-10-19 DIAGNOSIS — Z87891 Personal history of nicotine dependence: Secondary | ICD-10-CM | POA: Insufficient documentation

## 2022-10-19 DIAGNOSIS — D631 Anemia in chronic kidney disease: Secondary | ICD-10-CM | POA: Insufficient documentation

## 2022-10-19 DIAGNOSIS — G8194 Hemiplegia, unspecified affecting left nondominant side: Secondary | ICD-10-CM | POA: Insufficient documentation

## 2022-10-19 NOTE — Progress Notes (Signed)
[facility] [address] [place] [address]    Clinic Day:  10/19/2022  Referring physician: Iona Hansen, NP  Patient Care Team: Iona Hansen, NP as PCP - General (Nurse Practitioner) Dorena Cookey, MD (Inactive) as Consulting Physician (Gastroenterology) Doreatha Massed, MD as Medical Oncologist (Hematology)   ASSESSMENT & PLAN:   Assessment: 1.  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 - Colonoscopy (08/26/2017) normal. - Small bowel endoscopy (05/24/2018): 1 gastric polyp, 1 nonbleeding angiectasia in duodenum treated with APC.  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  Normal duodenum, first second and third parts. - He was receiving intermittent Venofer infusions, last 1 on 09/08/2021. - He was also receiving B12 injections, last 1 on 05/25/2022. - BMBX (10/06/2022): Hypercellular marrow (60%) involved by plasma cell neoplasm, 9% plasma cells by manual aspirate differential and 10% by CD138 IHC.  No evidence of morphologic dysplasia.  No ring sideroblasts. - Myeloma FISH panel: t(11;14) consistent with standard risk.  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  Social/family history: - Lives with wife at home.  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  Quit smoking 5 months ago.  Smoked 1/3 pack/day for 50 years. - Multiple maternal aunts and uncles had cancers.  Patient does not know the types.  Plan: 1.  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 - We discussed his new diagnosis in detail. - We discussed prognosis with data percent of people with smoldering myeloma progressing to multiple myeloma per year. - We will closely monitor his myeloma panel every 3 months initially.  2.  Normocytic anemia: - He has received 3 infusions of Feraheme in March. -  Ferritin on 10/05/2022 was 392, percent saturation 25.  Hemoglobin 9.6 with MCV 90.  He has underlying CKD.  He also had an acute drop in ferritin and hemoglobin in January of this year. - He is being evaluated by GI and is scheduled for EGD/colonoscopy on 11/06/2022 and a CT scan of the liver in May. - I will schedule him for Feraheme 510 mg x 1 in 4 weeks. - RTC 10 weeks with repeat CBC, ferritin and iron panel.  No orders of the defined types were placed in this encounter.   I,Alexis Herring,acting as a Neurosurgeon for [employer], MD.,have documented all relevant documentation on the behalf of Doreatha Massed, MD,as directed by  Doreatha Massed, MD while in the presence of Doreatha Massed, MD.   I, Doreatha Massed MD, have reviewed the above documentation for accuracy and completeness, and I agree with the above.   Doreatha Massed, MD   4/8/20246:12 PM  CHIEF COMPLAINT:   Diagnosis: Smoldering myeloma; Normocytic anemia  Prior Therapy: Venofer and B12 injections  Current Therapy:  Feraheme  INTERVAL HISTORY:   James Zuniga is a 67 y.o. male presenting to clinic today for follow up of normocytic anemia. He was last seen by me on 07/17/22 for consult. He was last seen by PA Pennington on 09/22/22.  He had a bone marrow biopsy on 10/06/22 with results consistent with smoldering myeloma  Today, he states that he is doing well overall. His appetite level is at 100%. His energy level is at 60-70%. He denies any bloody or black stools. He states that he was receiving iron infusions in  [place] but had not had one in a while due to transitioning his care. He last received a Feraheme infusion on 3/19. He is scheduled for colonoscopy and EGD on 11/06/22 with Dr. Eula Listen. He states that he is getting a liver CT on 12/10/22 to reassess a spot on his liver.  PAST MEDICAL HISTORY:   Past Medical History: Past Medical History:  Diagnosis Date   Arthritis    "all over"  (08/24/2017)   CHF (congestive heart failure) (HCC)    Chronic airway obstruction (HCC)    Chronic lower back pain    Coronary artery disease    Disorder of liver    "spots on my liver; they've rechecked them; they haven't changed" (08/24/2017)   Dyspnea    Esophageal reflux    Gout    "on daily RX" (08/24/2017)   Hyperlipidemia    Hypertension    Hypokalemia 11/2016   Hypotestosteronism 07/01/2013   Insomnia    Iron deficiency anemia, unspecified 07/01/2013   Memory loss    On home oxygen therapy    "2L prn" (08/24/2017)   OSA on CPAP    PVD (peripheral vascular disease) (HCC)    Renal disorder    Stroke (HCC) 01/2017   "left sided weakness remains" (08/24/2017)   Type II diabetes mellitus (HCC)    Vitamin D deficiency     Surgical History: Past Surgical History:  Procedure Laterality Date   COLONOSCOPY N/A 08/26/2017   Procedure: COLONOSCOPY;  Surgeon: Iva Boop, MD;  Location[facility] ENDOSCOPY;  Service: Endoscopy;  Laterality: N/A;   ENTEROSCOPY N/A 05/24/2018   Procedure: ENTEROSCOPY;  Surgeon: Shellia Cleverly, DO;  Location: [facility] ENDOSCOPY;  Service: Gastroenterology;  Laterality: N/A;   ESOPHAGOGASTRODUODENOSCOPY N/A 08/26/2017   Procedure: ESOPHAGOGASTRODUODENOSCOPY (EGD);  Surgeon: Iva Boop, MD;  Location: [facility] ENDOSCOPY;  Service: Endoscopy;  Laterality: N/A;   HOT HEMOSTASIS N/A 05/24/2018   Procedure: HOT HEMOSTASIS (ARGON PLASMA COAGULATION/BICAP);  Surgeon: Shellia Cleverly, DO;  Location: [facility] ENDOSCOPY;  Service: Gastroenterology;  Laterality: N/A;   KNEE ARTHROSCOPY Left    LOOP RECORDER IMPLANT  06/2017   POLYPECTOMY  05/24/2018   Procedure: POLYPECTOMY;  Surgeon: Shellia Cleverly, DO;  Location: [facility] ENDOSCOPY;  Service: Gastroenterology;;   TEE WITHOUT CARDIOVERSION N/A 12/24/2016   Procedure: TRANSESOPHAGEAL ECHOCARDIOGRAM (TEE);  Surgeon: Pricilla Riffle, MD;  Location: [facility] ENDOSCOPY;  Service: Cardiovascular;  Laterality: N/A;    Social  History: Social History   Socioeconomic History   Marital status: Married    Spouse name: Not on file   Number of children: Not on file   Years of education: Not on file   Highest education level: Not on file  Occupational History   Occupation: diabled    Comment: as of early 1990s  Tobacco Use   Smoking status: Former    Packs/day: 0.50    Years: 40.00    Additional pack years: 0.00    Total pack years: 20.00    Types: Cigarettes    Start date: 08/05/1983    Quit date: 08/15/2017    Years since quitting: 5.1   Smokeless tobacco: Never  Vaping Use   Vaping Use: Never used  Substance and Sexual Activity   Alcohol use: No    Alcohol/week: 0.0 standard drinks of alcohol   Drug use: No   Sexual activity: Not Currently  Other Topics Concern   Not on file  Social History Narrative   Not on file  Social Determinants of Health   Financial Resource Strain: Not on file  Food Insecurity: No Food Insecurity (07/17/2022)   Hunger Vital Sign    Worried About Running Out of Food in the Last Year: Never true    Ran Out of Food in the Last Year: Never true  Transportation Needs: No Transportation Needs (07/17/2022)   PRAPARE - Administrator, Civil Service (Medical): No    Lack of Transportation (Non-Medical): No  Physical Activity: Not on file  Stress: Not on file  Social Connections: Not on file  Intimate Partner Violence: Not At Risk (07/17/2022)   Humiliation, Afraid, Rape, and Kick questionnaire    Fear of Current or Ex-Partner: No    Emotionally Abused: No    Physically Abused: No    Sexually Abused: No    Family History: Family History  Problem Relation Age of Onset   Heart disease Mother    Diabetes Father    Stroke Brother    Stroke Paternal Uncle    Diabetes Sister    Heart attack Brother    Heart attack Brother     Current Medications:  Current Outpatient Medications:    albuterol (VENTOLIN HFA) 108 (90 Base) MCG/ACT inhaler, Inhale 2 puffs into  the lungs every 6 (six) hours as needed for wheezing or shortness of breath., Disp: 18 g, Rfl: 3   allopurinol (ZYLOPRIM) 300 MG tablet, Take 300 mg by mouth daily., Disp: , Rfl:    aspirin EC 81 MG tablet, Take 81 mg by mouth daily. Swallow whole., Disp: , Rfl:    atorvastatin (LIPITOR) 80 MG tablet, Take 1 tablet (80 mg total) by mouth every morning., Disp: 30 tablet, Rfl: 0   Blood Glucose Monitoring Suppl (FIFTY50 GLUCOSE METER 2.0) w/Device KIT, To check blood sugars TID or Use as instructed Include strips. Lancets, lancet device, control solution. batteries, Disp: , Rfl:    clopidogrel (PLAVIX) 75 MG tablet, Take 1 tablet (75 mg total) by mouth daily., Disp: 30 tablet, Rfl: 2   colchicine 0.6 MG tablet, Take 0.6 mg by mouth., Disp: , Rfl:    Cyanocobalamin (VITAMIN B-12 IJ), Inject 1,000 mg as directed every 30 (thirty) days. X 4, Disp: , Rfl:    diclofenac sodium (VOLTAREN) 1 % GEL, , Disp: , Rfl:    diclofenac Sodium (VOLTAREN) 1 % GEL, SMARTSIG:4 Gram(s) Topical 3 Times Daily PRN, Disp: , Rfl:    DULoxetine (CYMBALTA) 60 MG capsule, Take 60 mg by mouth 2 (two) times daily. , Disp: , Rfl:    fluticasone (FLONASE) 50 MCG/ACT nasal spray, Place 1 spray into both nostrils daily., Disp: 16 g, Rfl: 2   fluticasone furoate-vilanterol (BREO ELLIPTA) 200-25 MCG/ACT AEPB, Inhale 1 puff into the lungs daily., Disp: 1 each, Rfl: 5   furosemide (LASIX) 20 MG tablet, Take 20 mg by mouth every Tuesday, Thursday, and Saturday at 6 PM. , Disp: , Rfl:    gabapentin (NEURONTIN) 300 MG capsule, Take 300 mg by mouth 4 (four) times daily. , Disp: , Rfl:    glucose blood test strip, 1 each by Misc.(Non-Drug; Combo Route) route daily., Disp: , Rfl:    Insulin Glargine (TOUJEO SOLOSTAR [place]), Inject 70 Units into the skin at bedtime., Disp: , Rfl:    insulin NPH-regular Human (NOVOLIN 70/30) (70-30) 100 UNIT/ML injection, Please inject 25 units twice daily before breakfast and dinner. (Patient taking differently:  Inject 25-30 Units into the skin See admin instructions. Inject 30 units with breakfast &  28 units with dinner.), Disp: 10 mL, Rfl: 3   Insulin Pen Needle (FIFTY50 PEN NEEDLES) 32G X 4 MM MISC, 1 each by Misc.(Non-Drug; Combo Route) route daily., Disp: , Rfl:    insulin regular (NOVOLIN R) 100 units/mL injection, Use as sliding scale up to 10 units tid with meals, Disp: , Rfl:    ipratropium-albuterol (DUONEB) 0.5-2.5 (3) MG/3ML SOLN, USE 1 VIAL IN NEBULIZER 4 TIMES DAILY, Disp: 360 mL, Rfl: 5   Lancets (ONETOUCH ULTRASOFT) lancets, USE 1 LANCET TO CHECK GLUCOSE IN THE MORNING BEFORE BREAKFAST, Disp: , Rfl: 11   meclizine (ANTIVERT) 12.5 MG tablet, Take 1 tablet (12.5 mg total) by mouth 3 (three) times daily as needed for dizziness., Disp: 20 tablet, Rfl: 0   methocarbamol (ROBAXIN) 500 MG tablet, Take 500 mg by mouth 3 (three) times daily., Disp: , Rfl:    metoprolol succinate (TOPROL-XL) 100 MG 24 hr tablet, Take by mouth., Disp: , Rfl:    pantoprazole sodium (PROTONIX) 40 mg, Take 1 packet every day by oral route., Disp: , Rfl:    potassium chloride SA (K-DUR,KLOR-CON) 20 MEQ tablet, Take 1 tablet (20 mEq total) by mouth 2 (two) times daily. (Patient taking differently: Take 20 mEq by mouth 3 (three) times daily. Take 1 tablet (20 meq) scheduled three times daily & takes an additional tablet at lunch on Tuesdays, Thursdays, & Saturdays due to lasix), Disp: 30 tablet, Rfl: 3   primidone (MYSOLINE) 50 MG tablet, Take 100 mg by mouth at bedtime. , Disp: , Rfl:    sildenafil (VIAGRA) 100 MG tablet, Take 1 tablet by mouth daily as needed., Disp: , Rfl:    spironolactone (ALDACTONE) 25 MG tablet, Take 25 mg by mouth daily. Mon, wed, fri is when he takes this medication, Disp: , Rfl:    tamsulosin (FLOMAX) 0.4 MG CAPS capsule, Take 1 capsule (0.4 mg total) by mouth daily after breakfast. (Patient taking differently: Take 0.4 mg by mouth daily after supper.), Disp: 30 capsule, Rfl: 12   tiZANidine  (ZANAFLEX) 4 MG tablet, Take 4 mg by mouth 3 (three) times daily as needed for muscle spasms. , Disp: , Rfl: 0   traMADol (ULTRAM) 50 MG tablet, Take 50 mg by mouth daily as needed for moderate pain. , Disp: , Rfl:    zolpidem (AMBIEN CR) 12.5 MG CR tablet, Take 12.5 mg by mouth at bedtime as needed., Disp: , Rfl:    losartan (COZAAR) 100 MG tablet, Take 100 mg by mouth daily.  (Patient not taking: Reported on 10/05/2022), Disp: , Rfl:    Allergies: No Known Allergies  REVIEW OF SYSTEMS:   Review of Systems  Constitutional:  Negative for chills, fatigue and fever.  HENT:   Negative for lump/mass, mouth sores, nosebleeds, sore throat and trouble swallowing.   Eyes:  Negative for eye problems.  Respiratory:  Positive for shortness of breath. Negative for cough.   Cardiovascular:  Positive for chest pain and palpitations. Negative for leg swelling.  Gastrointestinal:  Negative for abdominal pain, constipation, diarrhea, nausea and vomiting.  Genitourinary:  Negative for bladder incontinence, difficulty urinating, dysuria, frequency, hematuria and nocturia.   Musculoskeletal:  Negative for arthralgias, back pain, flank pain, myalgias and neck pain.  Skin:  Negative for itching and rash.  Neurological:  Positive for dizziness and numbness (left leg). Negative for headaches.  Hematological:  Does not bruise/bleed easily.  Psychiatric/Behavioral:  Negative for depression, sleep disturbance and suicidal ideas. The patient is not nervous/anxious.  All other systems reviewed and are negative.    VITALS:   Blood pressure 114/76, pulse 85, temperature 98.4 F (36.9 C), temperature source Oral, resp. rate 16, weight 233 lb 0.4 oz (105.7 kg), SpO2 95 %.  Wt Readings from Last 3 Encounters:  10/19/22 233 lb 0.4 oz (105.7 kg)  10/05/22 239 lb 6.4 oz (108.6 kg)  09/22/22 232 lb (105.2 kg)    Body mass index is 37.61 kg/m.  Performance status (ECOG): 1 - Symptomatic but completely  ambulatory  PHYSICAL EXAM:   Physical Exam Vitals and nursing note reviewed. Exam conducted with a chaperone present.  Constitutional:      Appearance: Normal appearance.  Cardiovascular:     Rate and Rhythm: Normal rate and regular rhythm.     Pulses: Normal pulses.     Heart sounds: Normal heart sounds.  Pulmonary:     Effort: Pulmonary effort is normal.     Breath sounds: Normal breath sounds.  Abdominal:     Palpations: Abdomen is soft. There is no hepatomegaly, splenomegaly or mass.     Tenderness: There is no abdominal tenderness.  Musculoskeletal:     Right lower leg: No edema.     Left lower leg: No edema.  Lymphadenopathy:     Cervical: No cervical adenopathy.     Right cervical: No superficial, deep or posterior cervical adenopathy.    Left cervical: No superficial, deep or posterior cervical adenopathy.     Upper Body:     Right upper body: No supraclavicular or axillary adenopathy.     Left upper body: No supraclavicular or axillary adenopathy.  Neurological:     General: No focal deficit present.     Mental Status: He is alert and oriented to person, place, and time.  Psychiatric:        Mood and Affect: Mood normal.        Behavior: Behavior normal.     LABS:      Latest Ref Rng & Units 10/06/2022    7:16 AM 10/05/2022   12:15 PM 09/07/2022   10:52 AM  CBC  WBC 4.0 - 10.5 K/uL 6.2  5.8  6.6   Hemoglobin 13.0 - 17.0 g/dL 9.6  9.6  8.6   Hematocrit 39.0 - 52.0 % 33.8  32.6  29.6   Platelets 150 - 400 K/uL 265  265  333       Latest Ref Rng & Units 07/17/2022    1:17 PM 11/25/2021    2:26 PM 08/20/2021    2:09 PM  CMP  Glucose 70 - 99 mg/dL 161  096  045   BUN 8 - 23 mg/dL 12  15  15    Creatinine 0.61 - 1.24 mg/dL 4.09  8.11  9.14   Sodium 135 - 145 mmol/L 135  135  134   Potassium 3.5 - 5.1 mmol/L 4.1  4.3  4.5   Chloride 98 - 111 mmol/L 104  103  100   CO2 22 - 32 mmol/L 25  24  27    Calcium 8.9 - 10.3 mg/dL 9.8  78.2  95.6   Total Protein 6.5 -  8.1 g/dL 7.7  7.8  7.5   Total Bilirubin 0.3 - 1.2 mg/dL 0.3  0.4  0.4   Alkaline Phos 38 - 126 U/L 90  102  110   AST 15 - 41 U/L 18  20  15    ALT 0 - 44 U/L 15  25  20  Lab Results  Component Value Date   CEA 2.8 06/30/2013   /  CEA  Date Value Ref Range Status  06/30/2013 2.8 0.0 - 5.0 ng/mL Final   No results found for: "PSA1" No results found for: "CAN199" No results found for: "CAN125"  Lab Results  Component Value Date   TOTALPROTELP 7.3 09/14/2022   TOTALPROTELP 7.3 09/14/2022   ALBUMINELP 3.3 09/14/2022   A1GS 0.3 09/14/2022   A2GS 1.0 09/14/2022   BETS 1.0 09/14/2022   GAMS 1.7 09/14/2022   MSPIKE 1.5 (H) 09/14/2022   SPEI Comment 09/14/2022   Lab Results  Component Value Date   TIBC 287 10/05/2022   TIBC 389 09/07/2022   TIBC 418 07/17/2022   FERRITIN 392 (H) 10/05/2022   FERRITIN 14 (L) 09/07/2022   FERRITIN 9 (L) 07/17/2022   IRONPCTSAT 25 10/05/2022   IRONPCTSAT 7 (L) 09/07/2022   IRONPCTSAT 5 (L) 07/17/2022   Lab Results  Component Value Date   LDH 79 (L) 09/14/2022   LDH 85 (L) 07/17/2022   Pathology 10/06/22 DIAGNOSIS:   BONE MARROW, ASPIRATE, CLOT, CORE:  -Hypercellular bone marrow (60%) involved by plasma cell neoplasm (9%  plasma cells by manual aspirate differential and approximately 10% by  CD138 immunohistochemical analysis, and lambda restricted by in situ  hybridization).  See comment.   PERIPHERAL BLOOD:  -Microcytic hypochromic anemia   COMMENT:   No evidence of morphologic dysplasia is identified on the bone marrow  aspirate. Correlation with clinical findings, radiographic skeletal  survey, myeloma panel and other laboratory tests (SPEP, UPEP, beta  microglobulin, etc.) is recommended for a complete assessment and  classification of this case.    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  EXAM: NUCLEAR MEDICINE PET WHOLE BODY TECHNIQUE: 12.9 mCi F-18 FDG was injected intravenously. Full-ring PET imaging was performed from the head to foot after the radiotracer. CT data was obtained and used for attenuation correction and anatomic localization. Fasting blood glucose: 166 mg/dl COMPARISON:  None Available. FINDINGS: Mediastinal blood pool activity: SUV max 2.86 HEAD/NECK: No hypermetabolic activity in the scalp. No hypermetabolic cervical lymph nodes. Incidental CT findings: none CHEST: No hypermetabolic mediastinal or hilar nodes. No suspicious pulmonary nodules on the CT scan. Incidental CT findings: Full borderline cardiac size. No pericardial effusion. No significant vascular calcifications. ABDOMEN/PELVIS: No abnormal hypermetabolic activity within the liver, pancreas, adrenal glands, or spleen. No hypermetabolic lymph nodes in the abdomen or pelvis. Incidental CT findings: 15 mm right adrenal gland nodule demonstrates mild hypermetabolism with SUV max of 3.86 similar to background liver activity. This is unchanged since a prior PET-CT from 2015. 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 No further imaging evaluation or follow-up is necessary. 11 mm lower pole left renal calculus. SKELETON: No hypermetabolic bone lesions are identified. No lytic myelomatous lesions are demonstrated on the CT scan. Incidental CT findings: none EXTREMITIES: No hypermetabolic or lytic bone lesions. Incidental CT findings: none IMPRESSION: 1. No hypermetabolic bone lesions are identified. No lytic myelomatous bone lesions are seen on the CT scan. Six 2. No significant findings involving the chest, abdomen or pelvis. 3. 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 4. 11 mm lower pole left renal calculus. Electronically Signed   By: Rudie Meyer M.D.   On: 10/10/2022 10:30   CT BONE MARROW BIOPSY & ASPIRATION  Result Date:  10/06/2022 CLINICAL DATA:  Monoclonal gammopathy, iron  deficiency anemia and need for bone marrow biopsy. EXAM: CT GUIDED BONE MARROW ASPIRATION AND BIOPSY ANESTHESIA/SEDATION: Moderate (conscious) sedation was employed during this procedure. A total of Versed 3.0 mg and Fentanyl 150 mcg was administered intravenously. Moderate Sedation Time: 60 minutes. The patient's level of consciousness and vital signs were monitored continuously by radiology nursing throughout the procedure under my direct supervision. PROCEDURE: The procedure risks, benefits, and alternatives were explained to the patient. Questions regarding the procedure were encouraged and answered. The patient understands and consents to the procedure. A time out was performed prior to initiating the procedure. The right gluteal region was prepped with chlorhexidine. Sterile gown and sterile gloves were used for the procedure. Local anesthesia was provided with 1% Lidocaine. Initial attempts were performed under fluoroscopic guidance but due to depth of the iliac bone, accurate access was difficult under fluoroscopy and decision was made to perform the procedure under CT guidance. The patient was transported while monitored to CT after initiation of initial sedation. Under CT guidance, an 11 gauge On Control bone cutting needle was advanced from a posterior approach into the right iliac bone. Needle positioning was confirmed with CT. Initial non heparinized and heparinized aspirate samples were obtained of bone marrow. Core biopsy was performed via the On Control drill needle. COMPLICATIONS: None FINDINGS: Inspection of initial aspirate did reveal visible particles. Intact core biopsy sample was obtained. IMPRESSION: CT guided bone marrow biopsy of right posterior iliac bone with both aspirate and core samples obtained. Electronically Signed   By: Irish LackGlenn  Yamagata M.D.   On: 10/06/2022 12:11

## 2022-10-19 NOTE — Patient Instructions (Addendum)
[facility] at [facility] Discharge Instructions   You were seen and examined today by Dr. Ellin Saba.  He reviewed the results of your bone marrow biopsy. You have a condition called smoldering myeloma. This is a condition that is a precursor to a blood cancer called multiple myeloma. Smoldering myeloma does not require treatment, only close monitoring.   He reviewed the results of your PET scan which is normal.   We will see you back in three months. We will repeat lab work prior to your next visit.   Thank you for choosing [facility] at [facility] to provide your oncology and hematology care.  To afford each patient quality time with our provider, please arrive at least 15 minutes before your scheduled appointment time.   If you have a lab appointment with the Cancer Center please come in thru the Main Entrance and check in at the main information desk.  You need to re-schedule your appointment should you arrive 10 or more minutes late.  We strive to give you quality time with our providers, and arriving late affects you and other patients whose appointments are after yours.  Also, if you no show three or more times for appointments you may be dismissed from the clinic at the providers discretion.     Again, thank you for choosing [facility].  Our hope is that these requests will decrease the amount of time that you wait before being seen by our physicians.       _____________________________________________________________  Should you have questions after your visit to [facility], please contact our office at [phone] and follow the prompts.  Our office hours are 8:00 a.m. and 4:30 p.m. Monday - Friday.  Please note that voicemails left after 4:00 p.m. may not be returned until the following business day.  We are closed weekends and major holidays.  You do have access to a nurse 24-7, just call the main number to  the clinic [phone] and do not press any options, hold on the line and a nurse will answer the phone.    For prescription refill requests, have your pharmacy contact our office and allow 72 hours.    Due to Covid, you will need to wear a mask upon entering the hospital. If you do not have a mask, a mask will be given to you at the Main Entrance upon arrival. For doctor visits, patients may have 1 support person age 6 or older with them. For treatment visits, patients can not have anyone with them due to social distancing guidelines and our immunocompromised population.

## 2022-11-02 NOTE — Patient Instructions (Signed)
James Zuniga  11/02/2022     @PREFPERIOPPHARMACY @   Your procedure is scheduled on 11/06/2022.  Report to [facility] at 11:15 A.M.  Call this number if you have problems the morning of surgery:  [phone]  If you experience any cold or flu symptoms such as cough, fever, chills, shortness of breath, etc. between now and your scheduled surgery, please notify us at the above number.   Remember:   Please follow the diet and prep instructions given to you by Dr Luvenia Starch office.     Take these medicines the morning of surgery with A SIP OF WATER : Cymbalta Flonase Neurontin Antivert Robaxin Metoprolol Protonix and Tramadol   Do not take any diabetic medications the morning of the procedure.    Only take 1/2 of your insulin (Toujeo) the night before the procedure.     Do not wear jewelry, make-up or nail polish.  Do not wear lotions, powders, or perfumes, or deodorant.  Do not shave 48 hours prior to surgery.  Men may shave face and neck.  Do not bring valuables to the hospital.  [facility] is not responsible for any belongings or valuables.  Contacts, dentures or bridgework may not be worn into surgery.  Leave your suitcase in the car.  After surgery it may be brought to your room.  For patients admitted to the hospital, discharge time will be determined by your treatment team.  Patients discharged the day of surgery will not be allowed to drive home.   Name and phone number of your driver:   Family Special instructions:  N/A  Please read over the following fact sheets that you were given. Care and Recovery After Surgery    Upper Endoscopy, Adult Upper endoscopy is a procedure to look inside the upper GI (gastrointestinal) tract. The upper GI tract is made up of: The esophagus. This is the part of the body that moves food from your mouth to your stomach. The stomach. The duodenum. This is the first part of your small intestine. This procedure is also called  esophagogastroduodenoscopy (EGD) or gastroscopy. In this procedure, your health care provider passes a thin, flexible tube (endoscope) through your mouth and down your esophagus into your stomach and into your duodenum. A small camera is attached to the end of the tube. Images from the camera appear on a monitor in the exam room. During this procedure, your health care provider may also remove a small piece of tissue to be sent to a lab and examined under a microscope (biopsy). Your health care provider may do an upper endoscopy to diagnose cancers of the upper GI tract. You may also have this procedure to find the cause of other conditions, such as: Stomach pain. Heartburn. Pain or problems when swallowing. Nausea and vomiting. Stomach bleeding. Stomach ulcers. Tell a health care provider about: Any allergies you have. All medicines you are taking, including vitamins, herbs, eye drops, creams, and over-the-counter medicines. Any problems you or family members have had with anesthetic medicines. Any bleeding problems you have. Any surgeries you have had. Any medical conditions you have. Whether you are pregnant or may be pregnant. What are the risks? Your healthcare provider will talk with you about risks. These may include: Infection. Bleeding. Allergic reactions to medicines. A tear or hole (perforation) in the esophagus, stomach, or duodenum. What happens before the procedure? When to stop eating and drinking Follow instructions from your health care provider about what you may eat  and drink. These may include: 8 hours before your procedure Stop eating most foods. Do not eat meat, fried foods, or fatty foods. Eat only light foods, such as toast or crackers. All liquids are okay except energy drinks and alcohol. 6 hours before your procedure Stop eating. Drink only clear liquids, such as water, clear fruit juice, black coffee, plain tea, and sports drinks. Do not drink energy drinks  or alcohol. 2 hours before your procedure Stop drinking all liquids. You may be allowed to take medicines with small sips of water. If you do not follow your health care provider's instructions, your procedure may be delayed or canceled. Medicines Ask your health care provider about: Changing or stopping your regular medicines. This is especially important if you are taking diabetes medicines or blood thinners. Taking medicines such as aspirin and ibuprofen. These medicines can thin your blood. Do not take these medicines unless your health care provider tells you to take them. Taking over-the-counter medicines, vitamins, herbs, and supplements. General instructions If you will be going home right after the procedure, plan to have a responsible adult: Take you home from the hospital or clinic. You will not be allowed to drive. Care for you for the time you are told. What happens during the procedure?  An IV will be inserted into one of your veins. You may be given one or more of the following: A medicine to help you relax (sedative). A medicine to numb the throat (local anesthetic). You will lie on your left side on an exam table. Your health care provider will pass the endoscope through your mouth and down your esophagus. Your health care provider will use the scope to check the inside of your esophagus, stomach, and duodenum. Biopsies may be taken. The endoscope will be removed. The procedure may vary among health care providers and hospitals. What happens after the procedure? Your blood pressure, heart rate, breathing rate, and blood oxygen level will be monitored until you leave the hospital or clinic. When your throat is no longer numb, you may be given some fluids to drink. If you were given a sedative during the procedure, it can affect you for several hours. Do not drive or operate machinery until your health care provider says that it is safe. It is up to you to get the results  of your procedure. Ask your health care provider, or the department that is doing the procedure, when your results will be ready. Contact a health care provider if you: Have a sore throat that lasts longer than 1 day. Have a fever. Get help right away if you: Vomit blood or your vomit looks like coffee grounds. Have bloody, black, or tarry stools. Have a very bad sore throat or you cannot swallow. Have difficulty breathing or very bad pain in your chest or abdomen. These symptoms may be an emergency. Get help right away. Call 911. Do not wait to see if the symptoms will go away. Do not drive yourself to the hospital. Summary Upper endoscopy is a procedure to look inside the upper GI tract. During the procedure, an IV will be inserted into one of your veins. You may be given a medicine to help you relax. The endoscope will be passed through your mouth and down your esophagus. Follow instructions from your health care provider about what you can eat and drink. This information is not intended to replace advice given to you by your health care provider. Make sure you discuss any questions  you have with your health care provider. Document Revised: 10/08/2021 Document Reviewed: 10/08/2021 Elsevier Patient Education  2023 [employer]   Colonoscopy, Adult A colonoscopy is a procedure to look at the entire large intestine. This procedure is done using a long, thin, flexible tube that has a camera on the end. You may have a colonoscopy: As a part of normal colorectal screening. If you have certain symptoms, such as: A low number of red blood cells in your blood (anemia). Diarrhea that does not go away. Pain in your abdomen. Blood in your stool. A colonoscopy can help screen for and diagnose medical problems, including: An abnormal growth of cells or tissue (tumor). Abnormal growths within the lining of your intestine (polyps). Inflammation. Areas of bleeding. Tell your health care  provider about: Any allergies you have. All medicines you are taking, including vitamins, herbs, eye drops, creams, and over-the-counter medicines. Any problems you or family members have had with anesthetic medicines. Any bleeding problems you have. Any surgeries you have had. Any medical conditions you have. Any problems you have had with having bowel movements. Whether you are pregnant or may be pregnant. What are the risks? Generally, this is a safe procedure. However, problems may occur, including: Bleeding. Damage to your intestine. Allergic reactions to medicines given during the procedure. Infection. This is rare. What happens before the procedure? Eating and drinking restrictions Follow instructions from your health care provider about eating or drinking restrictions, which may include: A few days before the procedure: Follow a low-fiber diet. Avoid nuts, seeds, dried fruit, raw fruits, and vegetables. 1-3 days before the procedure: Eat only gelatin dessert or ice pops. Drink only clear liquids, such as water, clear juice, clear broth or bouillon, black coffee or tea, or clear soft drinks or sports drinks. Avoid liquids that contain red or purple dye. The day of the procedure: Do not eat solid foods. You may continue to drink clear liquids until up to 2 hours before the procedure. Do not eat or drink anything starting 2 hours before the procedure, or within the time period that your health care provider recommends. Bowel prep If you were prescribed a bowel prep to take by mouth (orally) to clean out your colon: Take it as told by your health care provider. Starting the day before your procedure, you will need to drink a large amount of liquid medicine. The liquid will cause you to have many bowel movements of loose stool until your stool becomes almost clear or light green. If your skin or the opening between the buttocks (anus) gets irritated from diarrhea, you may relieve  the irritation using: Wipes with medicine in them, such as adult wet wipes with aloe and vitamin E. A product to soothe skin, such as petroleum jelly. If you vomit while drinking the bowel prep: Take a break for up to 60 minutes. Begin the bowel prep again. Call your health care provider if you keep vomiting or you cannot take the bowel prep without vomiting. To clean out your colon, you may also be given: Laxative medicines. These help you have a bowel movement. Instructions for enema use. An enema is liquid medicine injected into your rectum. Medicines Ask your health care provider about: Changing or stopping your regular medicines or supplements. This is especially important if you are taking iron supplements, diabetes medicines, or blood thinners. Taking medicines such as aspirin and ibuprofen. These medicines can thin your blood. Do not take these medicines unless your health care provider  tells you to take them. Taking over-the-counter medicines, vitamins, herbs, and supplements. General instructions Ask your health care provider what steps will be taken to help prevent infection. These may include washing skin with a germ-killing soap. If you will be going home right after the procedure, plan to have a responsible adult: Take you home from the hospital or clinic. You will not be allowed to drive. Care for you for the time you are told. What happens during the procedure?  An IV will be inserted into one of your veins. You will be given a medicine to make you fall asleep (general anesthetic). You will lie on your side with your knees bent. A lubricant will be put on the tube. Then the tube will be: Inserted into your anus. Gently eased through all parts of your large intestine. Air will be sent into your colon to keep it open. This may cause some pressure or cramping. Images will be taken with the camera and will appear on a screen. A small tissue sample may be removed to be looked  at under a microscope (biopsy). The tissue may be sent to a lab for testing if any signs of problems are found. If small polyps are found, they may be removed and checked for cancer cells. When the procedure is finished, the tube will be removed. The procedure may vary among health care providers and hospitals. What happens after the procedure? Your blood pressure, heart rate, breathing rate, and blood oxygen level will be monitored until you leave the hospital or clinic. You may have a small amount of blood in your stool. You may pass gas and have mild cramping or bloating in your abdomen. This is caused by the air that was used to open your colon during the exam. If you were given a sedative during the procedure, it can affect you for several hours. Do not drive or operate machinery until your health care provider says that it is safe. It is up to you to get the results of your procedure. Ask your health care provider, or the department that is doing the procedure, when your results will be ready. Summary A colonoscopy is a procedure to look at the entire large intestine. Follow instructions from your health care provider about eating and drinking before the procedure. If you were prescribed an oral bowel prep to clean out your colon, take it as told by your health care provider. During the colonoscopy, a flexible tube with a camera on its end is inserted into the anus and then passed into all parts of the large intestine. This information is not intended to replace advice given to you by your health care provider. Make sure you discuss any questions you have with your health care provider. Document Revised: 06/23/2021 Document Reviewed: 02/19/2021 Elsevier Patient Education  2023 [employer]  Monitored Anesthesia Care Anesthesia refers to the techniques, procedures, and medicines that help a person stay safe and comfortable during surgery. Monitored anesthesia care, or sedation, is one type  of anesthesia. You may have sedation if you do not need to be asleep for your procedure. Procedures that use sedation may include: Surgery to remove cataracts from your eyes. A dental procedure. A biopsy. This is when a tissue sample is removed and looked at under a microscope. You will be watched closely during your procedure. Your level of sedation or type of anesthesia may be changed to fit your needs. Tell a health care provider about: Any allergies you have.  All medicines you are taking, including vitamins, herbs, eye drops, creams, and over-the-counter medicines. Any problems you or family members have had with anesthesia. Any bleeding problems you have. Any surgeries you have had. Any medical conditions or illnesses you have. This includes sleep apnea, cough, fever, or the flu. Whether you are pregnant or may be pregnant. Whether you use cigarettes, alcohol, or drugs. Any use of steroids, whether by mouth or as a cream. What are the risks? Your health care provider will talk with you about risks. These may include: Getting too much medicine (oversedation). Nausea. Allergic reactions to medicines. Trouble breathing. If this happens, a breathing tube may be used to help you breathe. It will be removed when you are awake and breathing on your own. Heart trouble. Lung trouble. Confusion that gets better with time (emergence delirium). What happens before the procedure? When to stop eating and drinking Follow instructions from your health care provider about what you may eat and drink. These may include: 8 hours before your procedure Stop eating most foods. Do not eat meat, fried foods, or fatty foods. Eat only light foods, such as toast or crackers. All liquids are okay except energy drinks and alcohol. 6 hours before your procedure Stop eating. Drink only clear liquids, such as water, clear fruit juice, black coffee, plain tea, and sports drinks. Do not drink energy drinks or  alcohol. 2 hours before your procedure Stop drinking all liquids. You may be allowed to take medicines with small sips of water. If you do not follow your health care provider's instructions, your procedure may be delayed or canceled. Medicines Ask your health care provider about: Changing or stopping your regular medicines. These include any diabetes medicines or blood thinners you take. Taking medicines such as aspirin and ibuprofen. These medicines can thin your blood. Do not take them unless your health care provider tells you to. Taking over-the-counter medicines, vitamins, herbs, and supplements. Testing You may have an exam or testing. You may have a blood or urine sample taken. General instructions Do not use any products that contain nicotine or tobacco for at least 4 weeks before the procedure. These products include cigarettes, chewing tobacco, and vaping devices, such as e-cigarettes. If you need help quitting, ask your health care provider. If you will be going home right after the procedure, plan to have a responsible adult: Take you home from the hospital or clinic. You will not be allowed to drive. Care for you for the time you are told. What happens during the procedure?  Your blood pressure, heart rate, breathing, level of pain, and blood oxygen level will be monitored. An IV will be inserted into one of your veins. You may be given: A sedative. This helps you relax. Anesthesia. This will: Numb certain areas of your body. Make you fall asleep for surgery. You will be given medicines as needed to keep you comfortable. The more medicine you are given, the deeper your level of sedation will be. Your level of sedation may be changed to fit your needs. There are three levels of sedation: Mild sedation. At this level, you may feel awake and relaxed. You will be able to follow directions. Moderate sedation. At this level, you will be sleepy. You may not remember the  procedure. Deep sedation. At this level, you will be asleep. You will not remember the procedure. How you get the medicines will depend on your age and the procedure. They may be given as: A pill. This  may be taken by mouth (orally) or inserted into the rectum. An injection. This may be into a vein or muscle. A spray through the nose. After your procedure is over, the medicine will be stopped. The procedure may vary among health care providers and hospitals. What happens after the procedure? Your blood pressure, heart rate, breathing rate, and blood oxygen level will be monitored until you leave the hospital or clinic. You may feel sleepy, clumsy, or nauseous. You may not remember what happened during or after the procedure. Sedation can affect you for several hours. Do not drive or use machinery until your health care provider says that it is safe. This information is not intended to replace advice given to you by your health care provider. Make sure you discuss any questions you have with your health care provider. Document Revised: 11/24/2021 Document Reviewed: 11/24/2021 Elsevier Patient Education  2023 [employer].

## 2022-11-03 ENCOUNTER — Other Ambulatory Visit: Payer: Self-pay

## 2022-11-03 ENCOUNTER — Encounter (HOSPITAL_COMMUNITY)
Admission: RE | Admit: 2022-11-03 | Discharge: 2022-11-03 | Disposition: A | Discharge status: Home / Self Care | Payer: Medicare Other | Source: Ambulatory Visit | Attending: Internal Medicine | Admitting: Internal Medicine

## 2022-11-03 ENCOUNTER — Encounter (HOSPITAL_COMMUNITY): Payer: Self-pay

## 2022-11-03 DIAGNOSIS — Z01818 Encounter for other preprocedural examination: Secondary | ICD-10-CM | POA: Diagnosis present

## 2022-11-03 LAB — BASIC METABOLIC PANEL
Anion gap: 9 (ref 5–15)
BUN: 9 mg/dL (ref 8–23)
CO2: 25 mmol/L (ref 22–32)
Calcium: 10.3 mg/dL (ref 8.9–10.3)
Chloride: 101 mmol/L (ref 98–111)
Creatinine, Ser: 1.33 mg/dL — ABNORMAL HIGH (ref 0.61–1.24)
GFR, Estimated: 59 mL/min — ABNORMAL LOW (ref >60)
Glucose, Bld: 264 mg/dL — ABNORMAL HIGH (ref 70–99)
Potassium: 3.6 mmol/L (ref 3.5–5.1)
Sodium: 135 mmol/L (ref 135–145)

## 2022-11-04 ENCOUNTER — Other Ambulatory Visit (HOSPITAL_COMMUNITY): Payer: Medicare Other

## 2022-11-06 ENCOUNTER — Ambulatory Visit (HOSPITAL_BASED_OUTPATIENT_CLINIC_OR_DEPARTMENT_OTHER): Payer: Medicare Other | Admitting: Certified Registered"

## 2022-11-06 ENCOUNTER — Encounter (HOSPITAL_COMMUNITY)
Admission: RE | Disposition: A | Discharge status: Home / Self Care | Payer: Self-pay | Source: Home / Self Care | Attending: Internal Medicine

## 2022-11-06 ENCOUNTER — Ambulatory Visit (HOSPITAL_COMMUNITY): Payer: Medicare Other | Admitting: Certified Registered"

## 2022-11-06 ENCOUNTER — Ambulatory Visit (HOSPITAL_COMMUNITY)
Admission: RE | Admit: 2022-11-06 | Discharge: 2022-11-06 | Disposition: A | Discharge status: Home / Self Care | Payer: Medicare Other | Attending: Internal Medicine | Admitting: Internal Medicine

## 2022-11-06 ENCOUNTER — Encounter (HOSPITAL_COMMUNITY): Payer: Self-pay | Admitting: Internal Medicine

## 2022-11-06 DIAGNOSIS — I509 Heart failure, unspecified: Secondary | ICD-10-CM | POA: Insufficient documentation

## 2022-11-06 DIAGNOSIS — I110 Hypertensive heart disease with heart failure: Secondary | ICD-10-CM | POA: Insufficient documentation

## 2022-11-06 DIAGNOSIS — I6930 Unspecified sequelae of cerebral infarction: Secondary | ICD-10-CM | POA: Diagnosis not present

## 2022-11-06 DIAGNOSIS — D630 Anemia in neoplastic disease: Secondary | ICD-10-CM

## 2022-11-06 DIAGNOSIS — K219 Gastro-esophageal reflux disease without esophagitis: Secondary | ICD-10-CM | POA: Diagnosis not present

## 2022-11-06 DIAGNOSIS — K635 Polyp of colon: Secondary | ICD-10-CM | POA: Insufficient documentation

## 2022-11-06 DIAGNOSIS — Z7984 Long term (current) use of oral hypoglycemic drugs: Secondary | ICD-10-CM | POA: Diagnosis not present

## 2022-11-06 DIAGNOSIS — D509 Iron deficiency anemia, unspecified: Secondary | ICD-10-CM

## 2022-11-06 DIAGNOSIS — Z79899 Other long term (current) drug therapy: Secondary | ICD-10-CM | POA: Diagnosis not present

## 2022-11-06 DIAGNOSIS — E1151 Type 2 diabetes mellitus with diabetic peripheral angiopathy without gangrene: Secondary | ICD-10-CM | POA: Insufficient documentation

## 2022-11-06 DIAGNOSIS — I2510 Atherosclerotic heart disease of native coronary artery without angina pectoris: Secondary | ICD-10-CM | POA: Diagnosis not present

## 2022-11-06 DIAGNOSIS — K317 Polyp of stomach and duodenum: Secondary | ICD-10-CM

## 2022-11-06 DIAGNOSIS — Z95810 Presence of automatic (implantable) cardiac defibrillator: Secondary | ICD-10-CM | POA: Diagnosis not present

## 2022-11-06 DIAGNOSIS — K3189 Other diseases of stomach and duodenum: Secondary | ICD-10-CM | POA: Diagnosis not present

## 2022-11-06 DIAGNOSIS — D120 Benign neoplasm of cecum: Secondary | ICD-10-CM | POA: Diagnosis not present

## 2022-11-06 DIAGNOSIS — G4733 Obstructive sleep apnea (adult) (pediatric): Secondary | ICD-10-CM | POA: Insufficient documentation

## 2022-11-06 DIAGNOSIS — K222 Esophageal obstruction: Secondary | ICD-10-CM | POA: Diagnosis not present

## 2022-11-06 DIAGNOSIS — K449 Diaphragmatic hernia without obstruction or gangrene: Secondary | ICD-10-CM

## 2022-11-06 DIAGNOSIS — J398 Other specified diseases of upper respiratory tract: Secondary | ICD-10-CM | POA: Insufficient documentation

## 2022-11-06 DIAGNOSIS — Z87891 Personal history of nicotine dependence: Secondary | ICD-10-CM | POA: Diagnosis not present

## 2022-11-06 DIAGNOSIS — J449 Chronic obstructive pulmonary disease, unspecified: Secondary | ICD-10-CM | POA: Diagnosis not present

## 2022-11-06 DIAGNOSIS — Z8719 Personal history of other diseases of the digestive system: Secondary | ICD-10-CM | POA: Diagnosis not present

## 2022-11-06 DIAGNOSIS — K2950 Unspecified chronic gastritis without bleeding: Secondary | ICD-10-CM

## 2022-11-06 DIAGNOSIS — Z794 Long term (current) use of insulin: Secondary | ICD-10-CM | POA: Insufficient documentation

## 2022-11-06 DIAGNOSIS — D132 Benign neoplasm of duodenum: Secondary | ICD-10-CM | POA: Diagnosis not present

## 2022-11-06 HISTORY — PX: COLONOSCOPY WITH PROPOFOL: SHX5780

## 2022-11-06 HISTORY — PX: POLYPECTOMY: SHX5525

## 2022-11-06 HISTORY — PX: ESOPHAGOGASTRODUODENOSCOPY (EGD) WITH PROPOFOL: SHX5813

## 2022-11-06 HISTORY — PX: BIOPSY: SHX5522

## 2022-11-06 LAB — GLUCOSE, CAPILLARY
Glucose-Capillary: 66 mg/dL — ABNORMAL LOW (ref 70–99)
Glucose-Capillary: 149 mg/dL — ABNORMAL HIGH (ref 70–99)

## 2022-11-06 SURGERY — COLONOSCOPY WITH PROPOFOL
Anesthesia: General

## 2022-11-06 MED ORDER — GLUCAGON HCL RDNA (DIAGNOSTIC) 1 MG IJ SOLR
INTRAMUSCULAR | Status: AC
  Filled 2022-11-06: qty 1

## 2022-11-06 MED ORDER — STERILE WATER FOR IRRIGATION IR SOLN
Status: DC | PRN
  Administered 2022-11-06: .6 mL

## 2022-11-06 MED ORDER — PHENYLEPHRINE HCL (PRESSORS) 10 MG/ML IV SOLN
INTRAVENOUS | Status: DC | PRN
  Administered 2022-11-06 (×13): 100 ug via INTRAVENOUS

## 2022-11-06 MED ORDER — DEXTROSE 50 % IV SOLN: 50.0000 mL | Freq: Once | INTRAVENOUS | Status: AC

## 2022-11-06 MED ORDER — PROPOFOL 10 MG/ML IV BOLUS
INTRAVENOUS | Status: DC | PRN
  Administered 2022-11-06: 100 mg via INTRAVENOUS

## 2022-11-06 MED ORDER — LACTATED RINGERS IV SOLN: INTRAVENOUS | Status: DC | PRN

## 2022-11-06 MED ORDER — PROPOFOL 500 MG/50ML IV EMUL
INTRAVENOUS | Status: DC | PRN
  Administered 2022-11-06: 150 ug/kg/min via INTRAVENOUS

## 2022-11-06 MED ORDER — GLUCAGON HCL RDNA (DIAGNOSTIC) 1 MG IJ SOLR
INTRAMUSCULAR | Status: DC | PRN
  Administered 2022-11-06: 1 mg via INTRAVENOUS

## 2022-11-06 MED ORDER — LIDOCAINE HCL (CARDIAC) PF 100 MG/5ML IV SOSY
PREFILLED_SYRINGE | INTRAVENOUS | Status: DC | PRN
  Administered 2022-11-06: 50 mg via INTRAVENOUS

## 2022-11-06 MED ORDER — DEXTROSE 50 % IV SOLN
INTRAVENOUS | Status: AC
  Administered 2022-11-06: 50 mL via INTRAVENOUS
  Filled 2022-11-06: qty 50

## 2022-11-06 MED ORDER — EPHEDRINE SULFATE-NACL 50-0.9 MG/10ML-% IV SOSY
PREFILLED_SYRINGE | INTRAVENOUS | Status: DC | PRN
  Administered 2022-11-06 (×5): 5 mg via INTRAVENOUS

## 2022-11-06 NOTE — Anesthesia Postprocedure Evaluation (Signed)
Anesthesia Post Note  Patient: James Zuniga  Procedure(s) Performed: COLONOSCOPY WITH PROPOFOL ESOPHAGOGASTRODUODENOSCOPY (EGD) WITH PROPOFOL POLYPECTOMY BIOPSY  Patient location during evaluation: Phase II Anesthesia Type: General Level of consciousness: awake and alert and oriented Pain management: pain level controlled Vital Signs Assessment: post-procedure vital signs reviewed and stable Respiratory status: spontaneous breathing, nonlabored ventilation and respiratory function stable Cardiovascular status: blood pressure returned to baseline and stable Postop Assessment: no apparent nausea or vomiting Anesthetic complications: no  No notable events documented.   Last Vitals:  Vitals:   11/06/22 1430 11/06/22 1436  BP: 98/63 106/75  Pulse:    Resp:    Temp:    SpO2:      Last Pain:  Vitals:   11/06/22 1424  TempSrc: Oral  PainSc: 0-No pain                 Isadora Delorey C Taylan Mayhan

## 2022-11-06 NOTE — Transfer of Care (Signed)
Immediate Anesthesia Transfer of Care Note  Patient: James Zuniga  Procedure(s) Performed: COLONOSCOPY WITH PROPOFOL ESOPHAGOGASTRODUODENOSCOPY (EGD) WITH PROPOFOL POLYPECTOMY BIOPSY  Patient Location: Short Stay  Anesthesia Type:General  Level of Consciousness: awake, alert , and oriented  Airway & Oxygen Therapy: Patient Spontanous Breathing  Post-op Assessment: Report given to RN, Post -op Vital signs reviewed and stable, Patient moving all extremities X 4, and Patient able to stick tongue midline  Post vital signs: Reviewed  Last Vitals:  Vitals Value Taken Time  BP 92/73 11/06/22 1424  Temp 36.6 C 11/06/22 1424  Pulse 67 11/06/22 1424  Resp 23 11/06/22 1424  SpO2 100 % 11/06/22 1424    Last Pain:  Vitals:   11/06/22 1424  TempSrc: Oral  PainSc: 0-No pain         Complications: No notable events documented.

## 2022-11-06 NOTE — Anesthesia Procedure Notes (Signed)
Date/Time: 11/06/2022 1:45 PM  Performed by: Julian Reil, CRNAPre-anesthesia Checklist: Patient identified, Emergency Drugs available, Suction available and Patient being monitored Patient Re-evaluated:Patient Re-evaluated prior to induction Oxygen Delivery Method: Nasal cannula Induction Type: IV induction Placement Confirmation: positive ETCO2

## 2022-11-06 NOTE — Op Note (Signed)
[facility] Patient Name: James Zuniga Procedure Date: 11/06/2022 1:23 PM MRN: [id number] Date of Birth: [birth year] Attending MD: Gennette Pac , MD, [id number] CSN: [id number] Age: 67 Admit Type: Outpatient Procedure:                Colonoscopy Indications:              Iron deficiency anemia Providers:                Gennette Pac, MD, Jannett Celestine, RN, Pandora Leiter, Technician, Dayton Scrape RN, RN Referring MD:              Medicines:                Propofol per Anesthesia Complications:            No immediate complications. Estimated Blood Loss:     Estimated blood loss was minimal. Estimated blood                            loss was minimal. Procedure:                Pre-Anesthesia Assessment:                           - Prior to the procedure, a History and Physical                            was performed, and patient medications and                            allergies were reviewed. The patient's tolerance of                            previous anesthesia was also reviewed. The risks                            and benefits of the procedure and the sedation                            options and risks were discussed with the patient.                            All questions were answered, and informed consent                            was obtained. Prior Anticoagulants: The patient                            last took Plavix (clopidogrel) 3 days prior to the                            procedure. ASA Grade Assessment: III - A patient  with severe systemic disease. After reviewing the                            risks and benefits, the patient was deemed in                            satisfactory condition to undergo the procedure.                           After obtaining informed consent, the colonoscope                            was passed under direct vision. Throughout the                             procedure, the patient's blood pressure, pulse, and                            oxygen saturations were monitored continuously. The                            [id number]) scope was introduced through                            the anus and advanced to the the cecum, identified                            by appendiceal orifice and ileocecal valve. The                            colonoscopy was performed without difficulty. The                            patient tolerated the procedure well. The quality                            of the bowel preparation was adequate. The                            colonoscopy was performed without difficulty. The                            patient tolerated the procedure well. The quality                            of the bowel preparation was adequate. Scope In: 2:06:28 PM Scope Out: 2:20:38 PM Scope Withdrawal Time: 0 hours 8 minutes 0 seconds  Total Procedure Duration: 0 hours 14 minutes 10 seconds  Findings:      The perianal and digital rectal examinations were normal.      A 3 mm polyp was found in the cecum. The polyp was sessile. The polyp       was removed with a cold snare. Resection and retrieval were complete.       Estimated blood loss:  none.      The exam was otherwise without abnormality on direct and retroflexion       views. Impression:               - One 3 mm polyp in the cecum, removed with a cold                            snare. Resected and retrieved.                           - The examination was otherwise normal on direct                            and retroflexion views. Moderate Sedation:      Moderate (conscious) sedation was personally administered by an       anesthesia professional. The following parameters were monitored: oxygen       saturation, heart rate, blood pressure, respiratory rate, EKG, adequacy       of pulmonary ventilation, and response to care. Recommendation:           - Patient has a contact  number available for                            emergencies. The signs and symptoms of potential                            delayed complications were discussed with the                            patient. Return to normal activities tomorrow.                            Written discharge instructions were provided to the                            patient.                           - Resume previous diet.                           - Continue present medications.                           - Repeat colonoscopy date to be determined after                            pending pathology results are reviewed for                            surveillance.                           - Return to GI office (date not yet determined).  See EGD report Procedure Code(s):        --- Professional ---                           [phone], Colonoscopy, flexible; with removal of                            tumor(s), polyp(s), or other lesion(s) by snare                            technique Diagnosis Code(s):        --- Professional ---                           D12.0, Benign neoplasm of cecum                           D50.9, Iron deficiency anemia, unspecified CPT copyright 2022 American Medical Association. All rights reserved. The codes documented in this report are preliminary and upon coder review may  be revised to meet current compliance requirements. Gerrit Friends. Lagretta Loseke, MD Gennette Pac, MD 11/06/2022 2:28:20 PM This report has been signed electronically. Number of Addenda: 0

## 2022-11-06 NOTE — Op Note (Signed)
[facility] Patient Name: James Zuniga Procedure Date: 11/06/2022 1:23 PM MRN: [id number] Date of Birth: 12/18/1955 Attending MD: Gennette Pac , MD, [id number] CSN: [id number] Age: 67 Admit Type: Outpatient Procedure:                Upper GI endoscopy Indications:              Iron deficiency anemia -new AICD Providers:                Gennette Pac, MD, Jannett Celestine, RN, Pandora Leiter, Technician, Dayton Scrape RN, RN Referring MD:              Medicines:                Propofol per Anesthesia Complications:            No immediate complications. Estimated Blood Loss:     Estimated blood loss was minimal. Procedure:                Pre-Anesthesia Assessment:                           - Prior to the procedure, a History and Physical                            was performed, and patient medications and                            allergies were reviewed. The patient's tolerance of                            previous anesthesia was also reviewed. The risks                            and benefits of the procedure and the sedation                            options and risks were discussed with the patient.                            All questions were answered, and informed consent                            was obtained. Prior Anticoagulants: The patient                            last took Plavix (clopidogrel) 3 days prior to the                            procedure. ASA Grade Assessment: III - A patient                            with severe systemic disease. After reviewing the  risks and benefits, the patient was deemed in                            satisfactory condition to undergo the procedure.                           After obtaining informed consent, the endoscope was                            passed under direct vision. Throughout the                            procedure, the patient's blood pressure,  pulse, and                            oxygen saturations were monitored continuously. The                            [id number] ([id number] scope was introduced through the                            mouth, and advanced to the third part of duodenum.                            The upper GI endoscopy was accomplished without                            difficulty. The patient tolerated the procedure                            well. Scope In: 1:41:04 PM Scope Out: 2:00:36 PM Total Procedure Duration: 0 hours 19 minutes 32 seconds  Findings:      A non-obstructing Schatzki ring was found at the gastroesophageal       junction.      A small hiatal hernia was present. Normal-appearing gastric mucosa.       Patent pylorus. Examination of the first second and third portion of       duodenum revealed a 6 mm sessile polyp in the second portion of the       duodenum. There was a larger 1.5 cm adenomatous appearing polypoid       lesion opposite the major papilla. Please see photos. This dropped into       view with after I removed the smaller polypoid lesion with a cold snare       and placed 1 hemostasis clip. The larger polypoid lesion opposite the       ampulla was somewhat firm to forcep palpation. Biopsies were taken.       Samples came off in chunks. Impression:               - Non-obstructing Schatzki ring. -Not manipulated                           - Small hiatal hernia. Normal-appearing gastric  mucosa.                           -Small duodenal polyp removed with a snare and                            clipped. Large polypoid lesion opposite the major                            papilla status post biopsy. Moderate Sedation:      Moderate (conscious) sedation was personally administered by an       anesthesia professional. The following parameters were monitored: oxygen       saturation, heart rate, blood pressure, respiratory rate, EKG, adequacy       of pulmonary  ventilation, and response to care. Recommendation:           - Patient has a contact number available for                            emergencies. The signs and symptoms of potential                            delayed complications were discussed with the                            patient. Return to normal activities tomorrow.                            Written discharge instructions were provided to the                            patient.                           - Advance diet as tolerated. Follow-up on                            pathology. No MRI until clip gone. See colonoscopy                            report. Procedure Code(s):        --- Professional ---                           [phone], Esophagogastroduodenoscopy, flexible,                            transoral; diagnostic, including collection of                            specimen(s) by brushing or washing, when performed                            (separate procedure) Diagnosis Code(s):        --- Professional ---  K22.2, Esophageal obstruction                           K44.9, Diaphragmatic hernia without obstruction or                            gangrene                           D50.9, Iron deficiency anemia, unspecified CPT copyright 2022 American Medical Association. All rights reserved. The codes documented in this report are preliminary and upon coder review may  be revised to meet current compliance requirements. Gerrit Friends. Arnol Mcgibbon, MD Gennette Pac, MD 11/06/2022 2:25:18 PM This report has been signed electronically. Number of Addenda: 0

## 2022-11-06 NOTE — H&P (Signed)
@LOGO @   Primary Care Physician:  Iona Hansen, NP Primary Gastroenterologist:  Dr. Jena Gauss  Pre-Procedure History & Physical: HPI:  James Zuniga is a 67 y.o. male here for  reevaluation of iron deficiency anemia.  History of small bowel AVMs.  He has an ICD in place at this time.   He is here for an EGD and colonoscopy.  Past Medical History:  Diagnosis Date   Arthritis    "all over" (08/24/2017)   CHF (congestive heart failure) (HCC)    Chronic airway obstruction (HCC)    Chronic lower back pain    Coronary artery disease    Disorder of liver    "spots on my liver; they've rechecked them; they haven't changed" (08/24/2017)   Dyspnea    Esophageal reflux    Gout    "on daily RX" (08/24/2017)   Hyperlipidemia    Hypertension    Hypokalemia 11/2016   Hypotestosteronism 07/01/2013   Insomnia    Iron deficiency anemia, unspecified 07/01/2013   Memory loss    On home oxygen therapy    "2L prn" (08/24/2017)   OSA on CPAP    PVD (peripheral vascular disease) (HCC)    Renal disorder    Stroke (HCC) 01/2017   "left sided weakness remains" (08/24/2017)   Type II diabetes mellitus (HCC)    Vitamin D deficiency     Past Surgical History:  Procedure Laterality Date   COLONOSCOPY N/A 08/26/2017   Procedure: COLONOSCOPY;  Surgeon: Iva Boop, MD;  Location: [facility] ENDOSCOPY;  Service: Endoscopy;  Laterality: N/A;   ENTEROSCOPY N/A 05/24/2018   Procedure: ENTEROSCOPY;  Surgeon: Shellia Cleverly, DO;  Location: [facility] ENDOSCOPY;  Service: Gastroenterology;  Laterality: N/A;   ESOPHAGOGASTRODUODENOSCOPY N/A 08/26/2017   Procedure: ESOPHAGOGASTRODUODENOSCOPY (EGD);  Surgeon: Iva Boop, MD;  Location: [facility] ENDOSCOPY;  Service: Endoscopy;  Laterality: N/A;   HOT HEMOSTASIS N/A 05/24/2018   Procedure: HOT HEMOSTASIS (ARGON PLASMA COAGULATION/BICAP);  Surgeon: Shellia Cleverly, DO;  Location: [facility] ENDOSCOPY;  Service: Gastroenterology;  Laterality: N/A;   KNEE ARTHROSCOPY Left    LOOP  RECORDER IMPLANT  06/2017   POLYPECTOMY  05/24/2018   Procedure: POLYPECTOMY;  Surgeon: Shellia Cleverly, DO;  Location: [facility] ENDOSCOPY;  Service: Gastroenterology;;   TEE WITHOUT CARDIOVERSION N/A 12/24/2016   Procedure: TRANSESOPHAGEAL ECHOCARDIOGRAM (TEE);  Surgeon: Pricilla Riffle, MD;  Location: [facility] ENDOSCOPY;  Service: Cardiovascular;  Laterality: N/A;    Prior to Admission medications   Medication Sig Start Date End Date Taking? Authorizing Provider  albuterol (VENTOLIN HFA) 108 (90 Base) MCG/ACT inhaler Inhale 2 puffs into the lungs every 6 (six) hours as needed for wheezing or shortness of breath. 09/08/19  Yes Coralyn Helling, MD  allopurinol (ZYLOPRIM) 300 MG tablet Take 300 mg by mouth daily.   Yes [provider]  aspirin EC 81 MG tablet Take 81 mg by mouth daily. Swallow whole.   Yes [provider]  atorvastatin (LIPITOR) 80 MG tablet Take 1 tablet (80 mg total) by mouth every morning. 12/25/16  Yes Nyra Market, MD  clopidogrel (PLAVIX) 75 MG tablet Take 1 tablet (75 mg total) by mouth daily. 12/26/16  Yes Nyra Market, MD  colchicine 0.6 MG tablet Take 0.6 mg by mouth daily. 03/04/21  Yes [provider]  Cyanocobalamin (VITAMIN B-12 IJ) Inject 1,000 mg as directed See admin instructions. Every 30 days as needed   Yes [provider]  diclofenac Sodium (VOLTAREN) 1 % GEL Apply 2 g  topically daily as needed (pain). 04/07/21  Yes [provider]  DULoxetine (CYMBALTA) 60 MG capsule Take 60 mg by mouth 2 (two) times daily.    Yes [provider]  fluticasone (FLONASE) 50 MCG/ACT nasal spray Place 1 spray into both nostrils daily. 10/28/21  Yes Coralyn Helling, MD  fluticasone furoate-vilanterol (BREO ELLIPTA) 200-25 MCG/ACT AEPB Inhale 1 puff into the lungs daily. 10/28/21  Yes Coralyn Helling, MD  furosemide (LASIX) 20 MG tablet Take 20 mg by mouth every Tuesday, Thursday, and Saturday at 6 PM.  03/21/13  Yes [provider]   gabapentin (NEURONTIN) 300 MG capsule Take 600 mg by mouth 3 (three) times daily. 10/18/14  Yes [provider]  Insulin Glargine (TOUJEO SOLOSTAR [place]) Inject 70 Units into the skin daily with supper. 04/28/21  Yes [provider]  insulin NPH-regular Human (NOVOLIN 70/30) (70-30) 100 UNIT/ML injection Please inject 25 units twice daily before breakfast and dinner. Patient taking differently: Inject 25 Units into the skin 2 (two) times daily with a meal. Sliding scale 08/26/17  Yes Arnetha Courser, MD  ipratropium-albuterol (DUONEB) 0.5-2.5 (3) MG/3ML SOLN USE 1 VIAL IN NEBULIZER 4 TIMES DAILY Patient taking differently: Take 3 mLs by nebulization 4 (four) times daily. 05/22/21  Yes Coralyn Helling, MD  losartan (COZAAR) 100 MG tablet Take 100 mg by mouth daily. 10/15/16  Yes [provider]  meclizine (ANTIVERT) 12.5 MG tablet Take 1 tablet (12.5 mg total) by mouth 3 (three) times daily as needed for dizziness. 07/23/21  Yes Charlynne Pander, MD  methocarbamol (ROBAXIN) 500 MG tablet Take 500 mg by mouth 3 (three) times daily as needed for muscle spasms. 04/09/21  Yes [provider]  metoprolol succinate (TOPROL-XL) 100 MG 24 hr tablet Take 100 mg by mouth daily. 07/01/22  Yes [provider]  pantoprazole (PROTONIX) 20 MG tablet Take 20 mg by mouth daily.   Yes [provider]  potassium chloride SA (K-DUR,KLOR-CON) 20 MEQ tablet Take 1 tablet (20 mEq total) by mouth 2 (two) times daily. Patient taking differently: Take 20 mEq by mouth 3 (three) times daily. Take 1 tablet (20 meq) scheduled three times daily & takes an additional tablet at lunch on Tuesdays, Thursdays, & Saturdays due to lasix 05/03/15  Yes Ennever, Rose Phi, MD  primidone (MYSOLINE) 50 MG tablet Take 100 mg by mouth at bedtime.  01/26/17  Yes [provider]  spironolactone (ALDACTONE) 25 MG tablet Take 25 mg by mouth every Monday, Wednesday, and Friday. 08/18/19  Yes [provider]  tamsulosin (FLOMAX) 0.4 MG CAPS capsule Take 1 capsule (0.4 mg total) by mouth daily after breakfast. Patient taking differently: Take 0.4 mg by mouth at bedtime. 12/06/15  Yes Ennever, Rose Phi, MD  tiZANidine (ZANAFLEX) 4 MG tablet Take 4 mg by mouth 3 (three) times daily as needed for muscle spasms.  10/04/14  Yes [provider]  traMADol (ULTRAM) 50 MG tablet Take 50 mg by mouth daily as needed for moderate pain.  09/08/13  Yes [provider]  zolpidem (AMBIEN CR) 12.5 MG CR tablet Take 12.5 mg by mouth at bedtime. 05/12/21  Yes [provider]  Blood Glucose Monitoring Suppl (FIFTY50 GLUCOSE METER 2.0) w/Device KIT To check blood sugars TID or Use as instructed Include strips. Lancets, lancet device, control solution. batteries 04/11/19   [provider]  glucose blood test strip 1 each by Misc.(Non-Drug; Combo Route) route daily. 05/26/17   [provider]  Insulin  Pen Needle (FIFTY50 PEN NEEDLES) 32G X 4 MM MISC 1 each by Misc.(Non-Drug; Combo Route) route daily. 04/18/19   [provider]  Lancets (ONETOUCH ULTRASOFT) lancets USE 1 LANCET TO CHECK GLUCOSE IN THE MORNING BEFORE BREAKFAST 03/28/18   [provider]    Allergies as of 10/06/2022   (No Known Allergies)    Family History  Problem Relation Age of Onset   Heart disease Mother    Diabetes Father    Stroke Brother    Stroke Paternal Uncle    Diabetes Sister    Heart attack Brother    Heart attack Brother     Social History   Socioeconomic History   Marital status: Married    Spouse name: Not on file   Number of children: Not on file   Years of education: Not on file   Highest education level: Not on file  Occupational History   Occupation: diabled    Comment: as of early 1990s  Tobacco Use   Smoking status: Former    Packs/day: 0.50    Years: 40.00    Additional pack years: 0.00    Total pack years: 20.00    Types: Cigarettes     Start date: 08/05/1983    Quit date: 08/15/2017    Years since quitting: 5.2   Smokeless tobacco: Never  Vaping Use   Vaping Use: Never used  Substance and Sexual Activity   Alcohol use: No    Alcohol/week: 0.0 standard drinks of alcohol   Drug use: No   Sexual activity: Not Currently  Other Topics Concern   Not on file  Social History Narrative   Not on file   Social Determinants of Health   Financial Resource Strain: Not on file  Food Insecurity: No Food Insecurity (07/17/2022)   Hunger Vital Sign    Worried About Running Out of Food in the Last Year: Never true    Ran Out of Food in the Last Year: Never true  Transportation Needs: No Transportation Needs (07/17/2022)   PRAPARE - Administrator, Civil Service (Medical): No    Lack of Transportation (Non-Medical): No  Physical Activity: Not on file  Stress: Not on file  Social Connections: Not on file  Intimate Partner Violence: Not At Risk (07/17/2022)   Humiliation, Afraid, Rape, and Kick questionnaire    Fear of Current or Ex-Partner: No    Emotionally Abused: No    Physically Abused: No    Sexually Abused: No    Review of Systems: See HPI, otherwise negative ROS  Physical Exam: BP (!) 123/92   Pulse 78   Temp 97.8 F (36.6 C) (Oral)   Resp 19   Ht 5\' 6"  (1.676 m)   Wt 104.3 kg   SpO2 100%   BMI 37.12 kg/m  General:   Alert,  Well-developed, well-nourished, pleasant and cooperative in NAD S Neck:  Supple; no masses or thyromegaly. No significant cervical adenopathy. Lungs:  Clear throughout to auscultation.   No wheezes, crackles, or rhonchi. No acute distress. Heart:  Regular rate and rhythm; no murmurs, clicks, rubs,  or gallops. Abdomen: Non-distended, normal bowel sounds.  Soft and nontender without appreciable mass or hepatosplenomegaly.  Impression/Plan:    67 year old gentleman with recurrent iron deficiency anemia here for an EGD and colonoscopy.  Recent AICD placement.  Offer the patient a both  diagnostic EGD and colonoscopy today.  The risks, benefits, limitations, imponderables and alternatives regarding both EGD and colonoscopy have  been reviewed with the patient. Questions have been answered. All parties agreeable.       Notice: This dictation was prepared with Dragon dictation along with smaller phrase technology. Any transcriptional errors that result from this process are unintentional and may not be corrected upon review.

## 2022-11-06 NOTE — Anesthesia Preprocedure Evaluation (Addendum)
Anesthesia Evaluation  Patient identified by MRN, date of birth, ID band Patient awake    Reviewed: Allergy & Precautions, H&P , NPO status , Patient's Chart, lab work & pertinent test results  History of Anesthesia Complications Negative for: history of anesthetic complications  Airway Mallampati: II  TM Distance: >3 FB  Positive for:  Tracheal deviation   Dental  (+) Upper Dentures, Lower Dentures   Pulmonary shortness of breath and with exertion, sleep apnea and Oxygen sleep apnea , COPD,  COPD inhaler, former smoker   Pulmonary exam normal breath sounds clear to auscultation       Cardiovascular Exercise Tolerance: Poor hypertension, Pt. on medications + CAD, + Peripheral Vascular Disease, +CHF and + DOE  Normal cardiovascular exam+ Cardiac Defibrillator  Rhythm:Regular Rate:Normal  Left ventricle: The cavity size was moderately dilated. Wall    thickness was normal. Systolic function was mildly to moderately    reduced. The estimated ejection fraction was in the range of 40%    to 45%.  - Mitral valve: Calcified annulus. Mildly thickened leaflets .    There was mild regurgitation.  - Atrial septum: No defect or patent foramen ovale was identified.  - Pulmonary arteries: PA peak pressure: 33 mm Hg (S).     Neuro/Psych CVA, Residual Symptoms  negative psych ROS   GI/Hepatic Neg liver ROS,GERD  Medicated and Controlled,,  Endo/Other  diabetes, Well Controlled, Type 2, Oral Hypoglycemic Agents    Renal/GU Renal InsufficiencyRenal disease  negative genitourinary   Musculoskeletal  (+) Arthritis , Osteoarthritis,    Abdominal   Peds negative pediatric ROS (+)  Hematology  (+) Blood dyscrasia, anemia   Anesthesia Other Findings   Reproductive/Obstetrics negative OB ROS                             Anesthesia Physical Anesthesia Plan  ASA: 3  Anesthesia Plan: General   Post-op Pain  Management: Minimal or no pain anticipated   Induction: Intravenous  PONV Risk Score and Plan: 1 and Propofol infusion  Airway Management Planned: Nasal Cannula and Natural Airway  Additional Equipment:   Intra-op Plan:   Post-operative Plan:   Informed Consent: I have reviewed the patients History and Physical, chart, labs and discussed the procedure including the risks, benefits and alternatives for the proposed anesthesia with the patient or authorized representative who has indicated his/her understanding and acceptance.       Plan Discussed with: CRNA and Surgeon  Anesthesia Plan Comments:        Anesthesia Quick Evaluation

## 2022-11-06 NOTE — Discharge Instructions (Signed)
EGD Discharge instructions Please read the instructions outlined below and refer to this sheet in the next few weeks. These discharge instructions provide you with general information on caring for yourself after you leave the hospital. Your doctor may also give you specific instructions. While your treatment has been planned according to the most current medical practices available, unavoidable complications occasionally occur. If you have any problems or questions after discharge, please call your doctor. ACTIVITY You may resume your regular activity but move at a slower pace for the next 24 hours.  Take frequent rest periods for the next 24 hours.  Walking will help expel (get rid of) the air and reduce the bloated feeling in your abdomen.  No driving for 24 hours (because of the anesthesia (medicine) used during the test).  You may shower.  Do not sign any important legal documents or operate any machinery for 24 hours (because of the anesthesia used during the test).  NUTRITION Drink plenty of fluids.  You may resume your normal diet.  Begin with a light meal and progress to your normal diet.  Avoid alcoholic beverages for 24 hours or as instructed by your caregiver.  MEDICATIONS You may resume your normal medications unless your caregiver tells you otherwise.  WHAT YOU CAN EXPECT TODAY You may experience abdominal discomfort such as a feeling of fullness or "gas" pains.  FOLLOW-UP Your doctor will discuss the results of your test with you.  SEEK IMMEDIATE MEDICAL ATTENTION IF ANY OF THE FOLLOWING OCCUR: Excessive nausea (feeling sick to your stomach) and/or vomiting.  Severe abdominal pain and distention (swelling).  Trouble swallowing.  Temperature over 101 F (37.8 C).  Rectal bleeding or vomiting of blood.    Colonoscopy Discharge Instructions  Read the instructions outlined below and refer to this sheet in the next few weeks. These discharge instructions provide you with  general information on caring for yourself after you leave the hospital. Your doctor may also give you specific instructions. While your treatment has been planned according to the most current medical practices available, unavoidable complications occasionally occur. If you have any problems or questions after discharge, call Dr. Gala Romney at [phone]. ACTIVITY You may resume your regular activity, but move at a slower pace for the next 24 hours.  Take frequent rest periods for the next 24 hours.  Walking will help get rid of the air and reduce the bloated feeling in your belly (abdomen).  No driving for 24 hours (because of the medicine (anesthesia) used during the test).   Do not sign any important legal documents or operate any machinery for 24 hours (because of the anesthesia used during the test).  NUTRITION Drink plenty of fluids.  You may resume your normal diet as instructed by your doctor.  Begin with a light meal and progress to your normal diet. Heavy or fried foods are harder to digest and may make you feel sick to your stomach (nauseated).  Avoid alcoholic beverages for 24 hours or as instructed.  MEDICATIONS You may resume your normal medications unless your doctor tells you otherwise.  WHAT YOU CAN EXPECT TODAY Some feelings of bloating in the abdomen.  Passage of more gas than usual.  Spotting of blood in your stool or on the toilet paper.  IF YOU HAD POLYPS REMOVED DURING THE COLONOSCOPY: No aspirin products for 7 days or as instructed.  No alcohol for 7 days or as instructed.  Eat a soft diet for the next 24 hours.  FINDING  OUT THE RESULTS OF YOUR TEST Not all test results are available during your visit. If your test results are not back during the visit, make an appointment with your caregiver to find out the results. Do not assume everything is normal if you have not heard from your caregiver or the medical facility. It is important for you to follow up on all of your test  results.  SEEK IMMEDIATE MEDICAL ATTENTION IF: You have more than a spotting of blood in your stool.  Your belly is swollen (abdominal distention).  You are nauseated or vomiting.  You have a temperature over 101.  You have abdominal pain or discomfort that is severe or gets worse throughout the day.       Polyp  in small intestine removed with snare and clipped.    Larger   polypoid lesion biopsied but not removed   small polyp in the colon removed   further recommendations to follow pending review of pathology report   no future MRI until clip is gone.   at patient request, I called Renette Butters at [phone]-reviewed findings and recommendations

## 2022-11-09 LAB — GLUCOSE, CAPILLARY: Glucose-Capillary: 117 mg/dL — ABNORMAL HIGH (ref 70–99)

## 2022-11-11 LAB — SURGICAL PATHOLOGY

## 2022-11-12 ENCOUNTER — Encounter (HOSPITAL_COMMUNITY): Payer: Self-pay | Admitting: Internal Medicine

## 2022-11-24 ENCOUNTER — Ambulatory Visit (HOSPITAL_COMMUNITY): Payer: Medicare Other

## 2022-11-27 ENCOUNTER — Inpatient Hospital Stay: Payer: Medicare Other | Attending: Hematology

## 2022-11-27 VITALS — BP 108/75 | HR 78 | Temp 97.6°F | Resp 18

## 2022-11-27 DIAGNOSIS — D649 Anemia, unspecified: Secondary | ICD-10-CM | POA: Insufficient documentation

## 2022-11-27 DIAGNOSIS — D509 Iron deficiency anemia, unspecified: Secondary | ICD-10-CM

## 2022-11-27 MED ORDER — CETIRIZINE HCL 10 MG PO TABS
10.0000 mg | ORAL_TABLET | Freq: Once | ORAL | Status: AC
  Administered 2022-11-27: 10 mg via ORAL
  Filled 2022-11-27: qty 1

## 2022-11-27 MED ORDER — SODIUM CHLORIDE 0.9 % IV SOLN: Freq: Once | INTRAVENOUS | Status: AC

## 2022-11-27 MED ORDER — ACETAMINOPHEN 325 MG PO TABS
650.0000 mg | ORAL_TABLET | Freq: Once | ORAL | Status: AC
  Administered 2022-11-27: 650 mg via ORAL
  Filled 2022-11-27: qty 2

## 2022-11-27 MED ORDER — SODIUM CHLORIDE 0.9 % IV SOLN
510.0000 mg | Freq: Once | INTRAVENOUS | Status: AC
  Administered 2022-11-27: 510 mg via INTRAVENOUS
  Filled 2022-11-27: qty 510

## 2022-11-27 NOTE — Patient Instructions (Signed)
[facility]  Discharge Instructions: Thank you for choosing [facility] to provide your oncology and hematology care.  If you have a lab appointment with the Cancer Center - please note that after April 8th, 2024, all labs will be drawn in the cancer center.  You do not have to check in or register with the main entrance as you have in the past but will complete your check-in in the cancer center.  Wear comfortable clothing and clothing appropriate for easy access to any Portacath or PICC line.   We strive to give you quality time with your provider. You may need to reschedule your appointment if you arrive late (15 or more minutes).  Arriving late affects you and other patients whose appointments are after yours.  Also, if you miss three or more appointments without notifying the office, you may be dismissed from the clinic at the provider's discretion.      For prescription refill requests, have your pharmacy contact our office and allow 72 hours for refills to be completed.    Today you received the following: Feraheme.  Ferumoxytol Injection What is this medication? FERUMOXYTOL (FER ue MOX i tol) treats low levels of iron in your body (iron deficiency anemia). Iron is a mineral that plays an important role in making red blood cells, which carry oxygen from your lungs to the rest of your body. This medicine may be used for other purposes; ask your health care provider or pharmacist if you have questions. COMMON BRAND NAME(S): Feraheme What should I tell my care team before I take this medication? They need to know if you have any of these conditions: Anemia not caused by low iron levels High levels of iron in the blood Magnetic resonance imaging (MRI) test scheduled An unusual or allergic reaction to iron, other medications, foods, dyes, or preservatives Pregnant or trying to get pregnant Breastfeeding How should I use this medication? This medication  is injected into a vein. It is given by your care team in a hospital or clinic setting. Talk to your care team the use of this medication in children. Special care may be needed. Overdosage: If you think you have taken too much of this medicine contact a poison control center or emergency room at once. NOTE: This medicine is only for you. Do not share this medicine with others. What if I miss a dose? It is important not to miss your dose. Call your care team if you are unable to keep an appointment. What may interact with this medication? Other iron products This list may not describe all possible interactions. Give your health care provider a list of all the medicines, herbs, non-prescription drugs, or dietary supplements you use. Also tell them if you smoke, drink alcohol, or use illegal drugs. Some items may interact with your medicine. What should I watch for while using this medication? Visit your care team regularly. Tell your care team if your symptoms do not start to get better or if they get worse. You may need blood work done while you are taking this medication. You may need to follow a special diet. Talk to your care team. Foods that contain iron include: whole grains/cereals, dried fruits, beans, or peas, leafy green vegetables, and organ meats (liver, kidney). What side effects may I notice from receiving this medication? Side effects that you should report to your care team as soon as possible: Allergic reactions--skin rash, itching, hives, swelling of the face,   lips, tongue, or throat Low blood pressure--dizziness, feeling faint or lightheaded, blurry vision Shortness of breath Side effects that usually do not require medical attention (report to your care team if they continue or are bothersome): Flushing Headache Joint pain Muscle pain Nausea Pain, redness, or irritation at injection site This list may not describe all possible side effects. Call your doctor for medical  advice about side effects. You may report side effects to FDA at 1-800-FDA-1088. Where should I keep my medication? This medication is given in a hospital or clinic and will not be stored at home. NOTE: This sheet is a summary. It may not cover all possible information. If you have questions about this medicine, talk to your doctor, pharmacist, or health care provider.  2023 Elsevier/Gold Standard (2020-11-20 00:00:00)       To help prevent nausea and vomiting after your treatment, we encourage you to take your nausea medication as directed.  BELOW ARE SYMPTOMS THAT SHOULD BE REPORTED IMMEDIATELY: *FEVER GREATER THAN 100.4 F (38 C) OR HIGHER *CHILLS OR SWEATING *NAUSEA AND VOMITING THAT IS NOT CONTROLLED WITH YOUR NAUSEA MEDICATION *UNUSUAL SHORTNESS OF BREATH *UNUSUAL BRUISING OR BLEEDING *URINARY PROBLEMS (pain or burning when urinating, or frequent urination) *BOWEL PROBLEMS (unusual diarrhea, constipation, pain near the anus) TENDERNESS IN MOUTH AND THROAT WITH OR WITHOUT PRESENCE OF ULCERS (sore throat, sores in mouth, or a toothache) UNUSUAL RASH, SWELLING OR PAIN  UNUSUAL VAGINAL DISCHARGE OR ITCHING   Items with * indicate a potential emergency and should be followed up as soon as possible or go to the Emergency Department if any problems should occur.  Please show the CHEMOTHERAPY ALERT CARD or IMMUNOTHERAPY ALERT CARD at check-in to the Emergency Department and triage nurse.  Should you have questions after your visit or need to cancel or reschedule your appointment, please contact [facility] [phone]  and follow the prompts.  Office hours are 8:00 a.m. to 4:30 p.m. Monday - Friday. Please note that voicemails left after 4:00 p.m. may not be returned until the following business day.  We are closed weekends and major holidays. You have access to a nurse at all times for urgent questions. Please call the main number to the clinic [phone] and follow  the prompts.  For any non-urgent questions, you may also contact your provider using MyChart. We now offer e-Visits for anyone 18 and older to request care online for non-urgent symptoms. For details visit [email or web address].   Also download the MyChart app! Go to the app store, search "MyChart", open the app, select [facility], and log in with your MyChart username and password.   

## 2022-11-27 NOTE — Progress Notes (Signed)
Patient presents today for iron infusion of Feraheme.  Patient is in satisfactory condition with no new complaints voiced.  Vital signs are stable.  We will proceed with infusion per provider orders.    Patient tolerated treatment well with no complaints voiced.  Patient left ambulatory in stable condition.  Vital signs stable at discharge.  Follow up as scheduled.

## 2022-12-10 ENCOUNTER — Ambulatory Visit (HOSPITAL_COMMUNITY): Admission: RE | Admit: 2022-12-10 | Payer: Medicare Other | Source: Ambulatory Visit

## 2022-12-11 ENCOUNTER — Ambulatory Visit (HOSPITAL_COMMUNITY): Admission: RE | Admit: 2022-12-11 | Payer: Medicare Other | Source: Ambulatory Visit

## 2022-12-16 ENCOUNTER — Ambulatory Visit (HOSPITAL_COMMUNITY)
Admission: RE | Admit: 2022-12-16 | Discharge: 2022-12-16 | Disposition: A | Discharge status: Home / Self Care | Payer: Medicare Other | Source: Ambulatory Visit | Attending: Gastroenterology | Admitting: Gastroenterology

## 2022-12-16 DIAGNOSIS — K769 Liver disease, unspecified: Secondary | ICD-10-CM | POA: Diagnosis present

## 2022-12-16 MED ORDER — IOHEXOL 300 MG/ML  SOLN
100.0000 mL | Freq: Once | INTRAMUSCULAR | Status: AC | PRN
  Administered 2022-12-16: 100 mL via INTRAVENOUS

## 2022-12-25 ENCOUNTER — Inpatient Hospital Stay: Payer: Medicare Other | Attending: Hematology

## 2022-12-25 DIAGNOSIS — D472 Monoclonal gammopathy: Secondary | ICD-10-CM | POA: Insufficient documentation

## 2022-12-25 DIAGNOSIS — D509 Iron deficiency anemia, unspecified: Secondary | ICD-10-CM | POA: Diagnosis present

## 2022-12-25 LAB — CBC WITH DIFFERENTIAL/PLATELET
Abs Immature Granulocytes: 0.03 10*3/uL (ref 0.00–0.07)
Basophils Absolute: 0 10*3/uL (ref 0.0–0.1)
Basophils Relative: 0 %
Eosinophils Absolute: 0.1 10*3/uL (ref 0.0–0.5)
Eosinophils Relative: 1 %
HCT: 38.6 % — ABNORMAL LOW (ref 39.0–52.0)
Hemoglobin: 11.7 g/dL — ABNORMAL LOW (ref 13.0–17.0)
Immature Granulocytes: 0 %
Lymphocytes Relative: 12 %
Lymphs Abs: 1.1 10*3/uL (ref 0.7–4.0)
MCH: 25.8 pg — ABNORMAL LOW (ref 26.0–34.0)
MCHC: 30.3 g/dL (ref 30.0–36.0)
MCV: 85 fL (ref 80.0–100.0)
Monocytes Absolute: 0.6 10*3/uL (ref 0.1–1.0)
Monocytes Relative: 6 %
Neutro Abs: 7.9 10*3/uL — ABNORMAL HIGH (ref 1.7–7.7)
Neutrophils Relative %: 81 %
Platelets: 266 10*3/uL (ref 150–400)
RBC: 4.54 MIL/uL (ref 4.22–5.81)
RDW: 17.4 % — ABNORMAL HIGH (ref 11.5–15.5)
WBC: 9.7 10*3/uL (ref 4.0–10.5)
nRBC: 0 % (ref 0.0–0.2)

## 2022-12-25 LAB — COMPREHENSIVE METABOLIC PANEL
ALT: 15 U/L (ref 0–44)
AST: 20 U/L (ref 15–41)
Albumin: 3.3 g/dL — ABNORMAL LOW (ref 3.5–5.0)
Alkaline Phosphatase: 95 U/L (ref 38–126)
Anion gap: 11 (ref 5–15)
BUN: 16 mg/dL (ref 8–23)
CO2: 24 mmol/L (ref 22–32)
Calcium: 10.3 mg/dL (ref 8.9–10.3)
Chloride: 99 mmol/L (ref 98–111)
Creatinine, Ser: 1.51 mg/dL — ABNORMAL HIGH (ref 0.61–1.24)
GFR, Estimated: 50 mL/min — ABNORMAL LOW (ref >60)
Glucose, Bld: 249 mg/dL — ABNORMAL HIGH (ref 70–99)
Potassium: 3.3 mmol/L — ABNORMAL LOW (ref 3.5–5.1)
Sodium: 134 mmol/L — ABNORMAL LOW (ref 135–145)
Total Bilirubin: 0.7 mg/dL (ref 0.3–1.2)
Total Protein: 8.4 g/dL — ABNORMAL HIGH (ref 6.5–8.1)

## 2022-12-25 LAB — FERRITIN: Ferritin: 591 ng/mL — ABNORMAL HIGH (ref 24–336)

## 2022-12-25 LAB — IRON AND TIBC
Iron: 14 ug/dL — ABNORMAL LOW (ref 45–182)
Saturation Ratios: 6 % — ABNORMAL LOW (ref 17.9–39.5)
TIBC: 247 ug/dL — ABNORMAL LOW (ref 250–450)
UIBC: 233 ug/dL

## 2022-12-25 LAB — LACTATE DEHYDROGENASE: LDH: 89 U/L — ABNORMAL LOW (ref 98–192)

## 2022-12-28 ENCOUNTER — Inpatient Hospital Stay: Payer: Medicare Other | Admitting: Hematology

## 2022-12-29 LAB — PROTEIN ELECTROPHORESIS, SERUM
A/G Ratio: 0.7 (ref 0.7–1.7)
Albumin ELP: 3.2 g/dL (ref 2.9–4.4)
Alpha-1-Globulin: 0.5 g/dL — ABNORMAL HIGH (ref 0.0–0.4)
Alpha-2-Globulin: 1.2 g/dL — ABNORMAL HIGH (ref 0.4–1.0)
Beta Globulin: 0.9 g/dL (ref 0.7–1.3)
Gamma Globulin: 1.7 g/dL (ref 0.4–1.8)
Globulin, Total: 4.3 g/dL — ABNORMAL HIGH (ref 2.2–3.9)
M-Spike, %: 1.2 g/dL — ABNORMAL HIGH
Total Protein ELP: 7.5 g/dL (ref 6.0–8.5)

## 2023-01-01 LAB — IMMUNOFIXATION ELECTROPHORESIS
IgA: 189 mg/dL (ref 61–437)
IgG (Immunoglobin G), Serum: 2536 mg/dL — ABNORMAL HIGH (ref 603–1613)
IgM (Immunoglobulin M), Srm: 61 mg/dL (ref 20–172)
Total Protein ELP: 7.6 g/dL (ref 6.0–8.5)

## 2023-01-07 ENCOUNTER — Other Ambulatory Visit: Payer: Self-pay | Admitting: *Deleted

## 2023-01-07 DIAGNOSIS — R935 Abnormal findings on diagnostic imaging of other abdominal regions, including retroperitoneum: Secondary | ICD-10-CM

## 2023-01-10 NOTE — Progress Notes (Signed)
[facility] [address], [place] [address]    Clinic Day:  01/11/2023  Referring physician: Iona Hansen, NP  Patient Care Team: Iona Hansen, NP as PCP - General (Nurse Practitioner) Dorena Cookey, MD (Inactive) as Consulting Physician (Gastroenterology) Doreatha Massed, MD as Medical Oncologist (Hematology)   ASSESSMENT & PLAN:   Assessment: 1.  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 - Colonoscopy (08/26/2017) normal. - Small bowel endoscopy (05/24/2018): 1 gastric polyp, 1 nonbleeding angiectasia in duodenum treated with APC.  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  Normal duodenum, first second and third parts. - He was receiving intermittent Venofer infusions, last 1 on 09/08/2021. - He was also receiving B12 injections, last 1 on 05/25/2022. - BMBX (10/06/2022): Hypercellular marrow (60%) involved by plasma cell neoplasm, 9% plasma cells by manual aspirate differential and 10% by CD138 IHC.  No evidence of morphologic dysplasia.  No ring sideroblasts. - Myeloma FISH panel: t(11;14) consistent with standard risk.  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  Social/family history: - Lives with wife at home.  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  Quit smoking 5 months ago.  Smoked 1/3 pack/day for 50 years. - Multiple maternal aunts and uncles had cancers.  Patient does not know the types.    Plan: 1.  IgG lambda smoldering multiple myeloma: - He does not report any new onset bone pains.  No recent infections. - Reviewed myeloma labs from 12/25/2022: M spike is 1.2 g.  Immunofixation shows IgG lambda.  FLC ratio is pending.  Hemoglobin-11.7, creatinine-1.51, calcium 10.3.  Albumin 3.3. - He reports taking vitamin D twice per week.  I have recommended that he stop taking vitamin D. - No "crab" features at this time.  Will repeat labs in 3  months.   2.  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 - Last Duncan Dull was in May.  He complains of feeling very tired. - Reviewed labs from 12/25/2022.  Hemoglobin is 11.7 and ferritin is 591 with percent saturation of 6. - As he is complaining of severe fatigue, recommend Feraheme x 1.  Will plan to repeat labs in 3 months.    Orders Placed This Encounter  Procedures   CBC with Differential    Standing Status:   Future    Standing Expiration Date:   01/11/2024   Comprehensive metabolic panel    Standing Status:   Future    Standing Expiration Date:   01/11/2024   Iron and TIBC ([facility] ONLY)    Standing Status:   Future    Standing Expiration Date:   01/11/2024   Ferritin    Standing Status:   Future    Standing Expiration Date:   01/11/2024   Kappa/lambda light chains    Standing Status:   Future    Standing Expiration Date:   01/11/2024   Protein electrophoresis, serum    Standing Status:   Future    Standing Expiration Date:   01/11/2024   Vitamin D 25 hydroxy    Standing Status:   Future    Standing Expiration Date:   01/11/2024      I,Katie Daubenspeck,acting as a scribe for Doreatha Massed, MD.,have documented all relevant documentation on the behalf of Doreatha Massed, MD,as directed by  Doreatha Massed, MD while in the presence  of Doreatha Massed, MD.   I, Doreatha Massed MD, have reviewed the above documentation for accuracy and completeness, and I agree with the above.   Doreatha Massed, MD   7/1/20244:59 PM  CHIEF COMPLAINT:   Diagnosis: Smoldering myeloma; Normocytic anemia    Cancer Staging  No matching staging information was found for the patient.   Prior Therapy: Venofer and B12 injections   Current Therapy:  Feraheme    HISTORY OF PRESENT ILLNESS:   Oncology History  Chronic iron deficiency anemia     INTERVAL HISTORY:   James Zuniga is a 67 y.o. male presenting to clinic today for  follow up of Smoldering myeloma; Normocytic anemia. He was last seen by me on 10/19/22.  Of note since his last visit, he underwent liver/abdomen CT on 12/16/22 showing: no suspicious liver masses; flash-filling 1.4 cm peripheral segment 5 right liver mass, mildly increased from 0.7 cm on 2015 MRI, most compatible with benign lesion favoring hemangioma; stable right adrenal lipid poor adenoma.  Today, he states that he is doing well overall. His appetite level is at 75%. His energy level is at 40%.  PAST MEDICAL HISTORY:   Past Medical History: Past Medical History:  Diagnosis Date   Arthritis    "all over" (08/24/2017)   CHF (congestive heart failure) (HCC)    Chronic airway obstruction (HCC)    Chronic lower back pain    Coronary artery disease    Disorder of liver    "spots on my liver; they've rechecked them; they haven't changed" (08/24/2017)   Dyspnea    Esophageal reflux    Gout    "on daily RX" (08/24/2017)   Hyperlipidemia    Hypertension    Hypokalemia 11/2016   Hypotestosteronism 07/01/2013   Insomnia    Iron deficiency anemia, unspecified 07/01/2013   Memory loss    On home oxygen therapy    "2L prn" (08/24/2017)   OSA on CPAP    PVD (peripheral vascular disease) (HCC)    Renal disorder    Stroke (HCC) 01/2017   "left sided weakness remains" (08/24/2017)   Type II diabetes mellitus (HCC)    Vitamin D deficiency     Surgical History: Past Surgical History:  Procedure Laterality Date   BIOPSY  11/06/2022   Procedure: BIOPSY;  Surgeon: Corbin Ade, MD;  [facility]: [facility];  Service: Endoscopy;;   COLONOSCOPY N/A 08/26/2017   Procedure: COLONOSCOPY;  Surgeon: Iva Boop, MD;  Location: [facility] ENDOSCOPY;  Service: Endoscopy;  Laterality: N/A;   COLONOSCOPY WITH PROPOFOL N/A 11/06/2022   Procedure: COLONOSCOPY WITH PROPOFOL;  Surgeon: Corbin Ade, MD;  Location: [facility];  Service: Endoscopy;  Laterality: N/A;  1:15 PM   ENTEROSCOPY N/A 05/24/2018    Procedure: ENTEROSCOPY;  Surgeon: Shellia Cleverly, DO;  Location: [facility] ENDOSCOPY;  Service: Gastroenterology;  Laterality: N/A;   ESOPHAGOGASTRODUODENOSCOPY N/A 08/26/2017   Procedure: ESOPHAGOGASTRODUODENOSCOPY (EGD);  Surgeon: Iva Boop, MD;  Location: [facility] ENDOSCOPY;  Service: Endoscopy;  Laterality: N/A;   ESOPHAGOGASTRODUODENOSCOPY (EGD) WITH PROPOFOL N/A 11/06/2022   Procedure: ESOPHAGOGASTRODUODENOSCOPY (EGD) WITH PROPOFOL;  Surgeon: Corbin Ade, MD;  [facility]: [facility];  Service: Endoscopy;  Laterality: N/A;   HOT HEMOSTASIS N/A 05/24/2018   Procedure: HOT HEMOSTASIS (ARGON PLASMA COAGULATION/BICAP);  Surgeon: Shellia Cleverly, DO;  Location: [facility] ENDOSCOPY;  Service: Gastroenterology;  Laterality: N/A;   KNEE ARTHROSCOPY Left    LOOP RECORDER IMPLANT  06/2017   POLYPECTOMY  05/24/2018  Procedure: POLYPECTOMY;  Surgeon: Shellia Cleverly, DO;  Location: [facility] ENDOSCOPY;  Service: Gastroenterology;;   POLYPECTOMY  11/06/2022   Procedure: POLYPECTOMY;  Surgeon: Corbin Ade, MD;  [facility]: [facility];  Service: Endoscopy;;   TEE WITHOUT CARDIOVERSION N/A 12/24/2016   Procedure: TRANSESOPHAGEAL ECHOCARDIOGRAM (TEE);  Surgeon: Pricilla Riffle, MD;  Location: [facility] ENDOSCOPY;  Service: Cardiovascular;  Laterality: N/A;    Social History: Social History   Socioeconomic History   Marital status: Married    Spouse name: Not on file   Number of children: Not on file   Years of education: Not on file   Highest education level: Not on file  Occupational History   Occupation: diabled    Comment: as of early 1990s  Tobacco Use   Smoking status: Former    Packs/day: 0.50    Years: 40.00    Additional pack years: 0.00    Total pack years: 20.00    Types: Cigarettes    Start date: 08/05/1983    Quit date: 08/15/2017    Years since quitting: 5.4   Smokeless tobacco: Never  Vaping Use   Vaping Use: Never used  Substance and Sexual Activity   Alcohol use: No    Alcohol/week:  0.0 standard drinks of alcohol   Drug use: No   Sexual activity: Not Currently  Other Topics Concern   Not on file  Social History Narrative   Not on file   Social Determinants of Health   Financial Resource Strain: Not on file  Food Insecurity: No Food Insecurity (07/17/2022)   Hunger Vital Sign    Worried About Running Out of Food in the Last Year: Never true    Ran Out of Food in the Last Year: Never true  Transportation Needs: No Transportation Needs (07/17/2022)   PRAPARE - Administrator, Civil Service (Medical): No    Lack of Transportation (Non-Medical): No  Physical Activity: Not on file  Stress: Not on file  Social Connections: Not on file  Intimate Partner Violence: Not At Risk (07/17/2022)   Humiliation, Afraid, Rape, and Kick questionnaire    Fear of Current or Ex-Partner: No    Emotionally Abused: No    Physically Abused: No    Sexually Abused: No    Family History: Family History  Problem Relation Age of Onset   Heart disease Mother    Diabetes Father    Stroke Brother    Stroke Paternal Uncle    Diabetes Sister    Heart attack Brother    Heart attack Brother     Current Medications:  Current Outpatient Medications:    albuterol (VENTOLIN HFA) 108 (90 Base) MCG/ACT inhaler, Inhale 2 puffs into the lungs every 6 (six) hours as needed for wheezing or shortness of breath., Disp: 18 g, Rfl: 3   allopurinol (ZYLOPRIM) 300 MG tablet, Take 300 mg by mouth daily., Disp: , Rfl:    aspirin EC 81 MG tablet, Take 81 mg by mouth daily. Swallow whole., Disp: , Rfl:    atorvastatin (LIPITOR) 80 MG tablet, Take 1 tablet (80 mg total) by mouth every morning., Disp: 30 tablet, Rfl: 0   Blood Glucose Monitoring Suppl (FIFTY50 GLUCOSE METER 2.0) w/Device KIT, To check blood sugars TID or Use as instructed Include strips. Lancets, lancet device, control solution. batteries, Disp: , Rfl:    clopidogrel (PLAVIX) 75 MG tablet, Take 1 tablet (75 mg total) by mouth daily.,  Disp: 30 tablet, Rfl: 2  colchicine 0.6 MG tablet, Take 0.6 mg by mouth daily., Disp: , Rfl:    Cyanocobalamin (VITAMIN B-12 IJ), Inject 1,000 mg as directed See admin instructions. Every 30 days as needed, Disp: , Rfl:    diclofenac Sodium (VOLTAREN) 1 % GEL, Apply 2 g topically daily as needed (pain)., Disp: , Rfl:    DULoxetine (CYMBALTA) 60 MG capsule, Take 60 mg by mouth 2 (two) times daily. , Disp: , Rfl:    fluticasone (FLONASE) 50 MCG/ACT nasal spray, Place 1 spray into both nostrils daily., Disp: 16 g, Rfl: 2   fluticasone furoate-vilanterol (BREO ELLIPTA) 200-25 MCG/ACT AEPB, Inhale 1 puff into the lungs daily., Disp: 1 each, Rfl: 5   furosemide (LASIX) 20 MG tablet, Take 20 mg by mouth every Tuesday, Thursday, and Saturday at 6 PM. , Disp: , Rfl:    gabapentin (NEURONTIN) 300 MG capsule, Take 600 mg by mouth 3 (three) times daily., Disp: , Rfl:    glucose blood test strip, 1 each by Misc.(Non-Drug; Combo Route) route daily., Disp: , Rfl:    Insulin Glargine (TOUJEO SOLOSTAR [place]), Inject 70 Units into the skin daily with supper., Disp: , Rfl:    insulin NPH-regular Human (NOVOLIN 70/30) (70-30) 100 UNIT/ML injection, Please inject 25 units twice daily before breakfast and dinner. (Patient taking differently: Inject 25 Units into the skin 2 (two) times daily with a meal. Sliding scale), Disp: 10 mL, Rfl: 3   Insulin Pen Needle (FIFTY50 PEN NEEDLES) 32G X 4 MM MISC, 1 each by Misc.(Non-Drug; Combo Route) route daily., Disp: , Rfl:    ipratropium-albuterol (DUONEB) 0.5-2.5 (3) MG/3ML SOLN, USE 1 VIAL IN NEBULIZER 4 TIMES DAILY (Patient taking differently: Take 3 mLs by nebulization 4 (four) times daily.), Disp: 360 mL, Rfl: 5   Lancets (ONETOUCH ULTRASOFT) lancets, USE 1 LANCET TO CHECK GLUCOSE IN THE MORNING BEFORE BREAKFAST, Disp: , Rfl: 11   meclizine (ANTIVERT) 12.5 MG tablet, Take 1 tablet (12.5 mg total) by mouth 3 (three) times daily as needed for dizziness., Disp: 20 tablet, Rfl: 0    methocarbamol (ROBAXIN) 500 MG tablet, Take 500 mg by mouth 3 (three) times daily as needed for muscle spasms., Disp: , Rfl:    metoprolol succinate (TOPROL-XL) 100 MG 24 hr tablet, Take 100 mg by mouth daily., Disp: , Rfl:    pantoprazole (PROTONIX) 20 MG tablet, Take 20 mg by mouth daily., Disp: , Rfl:    potassium chloride SA (K-DUR,KLOR-CON) 20 MEQ tablet, Take 1 tablet (20 mEq total) by mouth 2 (two) times daily. (Patient taking differently: Take 20 mEq by mouth 3 (three) times daily. Take 1 tablet (20 meq) scheduled three times daily & takes an additional tablet at lunch on Tuesdays, Thursdays, & Saturdays due to lasix), Disp: 30 tablet, Rfl: 3   primidone (MYSOLINE) 50 MG tablet, Take 100 mg by mouth at bedtime. , Disp: , Rfl:    spironolactone (ALDACTONE) 25 MG tablet, Take 25 mg by mouth every Monday, Wednesday, and Friday., Disp: , Rfl:    tamsulosin (FLOMAX) 0.4 MG CAPS capsule, Take 1 capsule (0.4 mg total) by mouth daily after breakfast. (Patient taking differently: Take 0.4 mg by mouth at bedtime.), Disp: 30 capsule, Rfl: 12   tiZANidine (ZANAFLEX) 4 MG tablet, Take 4 mg by mouth 3 (three) times daily as needed for muscle spasms. , Disp: , Rfl: 0   traMADol (ULTRAM) 50 MG tablet, Take 50 mg by mouth daily as needed for moderate pain. , Disp: , Rfl:  zolpidem (AMBIEN CR) 12.5 MG CR tablet, Take 12.5 mg by mouth at bedtime., Disp: , Rfl:    losartan (COZAAR) 100 MG tablet, Take 100 mg by mouth daily. (Patient not taking: Reported on 01/11/2023), Disp: , Rfl:    Allergies: No Known Allergies  REVIEW OF SYSTEMS:   Review of Systems  Constitutional:  Positive for fatigue. Negative for chills and fever.  HENT:   Negative for lump/mass, mouth sores, nosebleeds, sore throat and trouble swallowing.   Eyes:  Negative for eye problems.  Respiratory:  Positive for cough and shortness of breath.   Cardiovascular:  Positive for leg swelling and palpitations. Negative for chest pain.   Gastrointestinal:  Negative for abdominal pain, constipation, diarrhea, nausea and vomiting.  Genitourinary:  Negative for bladder incontinence, difficulty urinating, dysuria, frequency, hematuria and nocturia.   Musculoskeletal:  Negative for arthralgias, back pain, flank pain, myalgias and neck pain.  Skin:  Negative for itching and rash.  Neurological:  Positive for dizziness and numbness. Negative for headaches.  Hematological:  Does not bruise/bleed easily.  Psychiatric/Behavioral:  Positive for sleep disturbance. Negative for depression and suicidal ideas. The patient is not nervous/anxious.   All other systems reviewed and are negative.    VITALS:   Blood pressure 122/87, pulse 75, temperature 97.7 F (36.5 C), temperature source Tympanic, resp. rate 20, weight 228 lb 11.2 oz (103.7 kg), SpO2 98 %.  Wt Readings from Last 3 Encounters:  01/11/23 228 lb 11.2 oz (103.7 kg)  11/06/22 230 lb (104.3 kg)  11/03/22 (P) 230 lb (104.3 kg)    Body mass index is 36.91 kg/m.  Performance status (ECOG): 1 - Symptomatic but completely ambulatory  PHYSICAL EXAM:   Physical Exam Vitals and nursing note reviewed. Exam conducted with a chaperone present.  Constitutional:      Appearance: Normal appearance.  Cardiovascular:     Rate and Rhythm: Normal rate and regular rhythm.     Pulses: Normal pulses.     Heart sounds: Normal heart sounds.  Pulmonary:     Effort: Pulmonary effort is normal.     Breath sounds: Normal breath sounds.  Abdominal:     Palpations: Abdomen is soft. There is no hepatomegaly, splenomegaly or mass.     Tenderness: There is no abdominal tenderness.  Musculoskeletal:     Right lower leg: No edema.     Left lower leg: No edema.  Lymphadenopathy:     Cervical: No cervical adenopathy.     Right cervical: No superficial, deep or posterior cervical adenopathy.    Left cervical: No superficial, deep or posterior cervical adenopathy.     Upper Body:     Right  upper body: No supraclavicular or axillary adenopathy.     Left upper body: No supraclavicular or axillary adenopathy.  Neurological:     General: No focal deficit present.     Mental Status: He is alert and oriented to person, place, and time.  Psychiatric:        Mood and Affect: Mood normal.        Behavior: Behavior normal.     LABS:      Latest Ref Rng & Units 12/25/2022    1:00 PM 10/06/2022    7:16 AM 10/05/2022   12:15 PM  CBC  WBC 4.0 - 10.5 K/uL 9.7  6.2  5.8   Hemoglobin 13.0 - 17.0 g/dL 11.9  9.6  9.6   Hematocrit 39.0 - 52.0 % 38.6  33.8  32.6  Platelets 150 - 400 K/uL 266  265  265       Latest Ref Rng & Units 12/25/2022    1:00 PM 11/03/2022    3:51 PM 07/17/2022    1:17 PM  CMP  Glucose 70 - 99 mg/dL 161  096  045   BUN 8 - 23 mg/dL 16  9  12    Creatinine 0.61 - 1.24 mg/dL 4.09  8.11  9.14   Sodium 135 - 145 mmol/L 134  135  135   Potassium 3.5 - 5.1 mmol/L 3.3  3.6  4.1   Chloride 98 - 111 mmol/L 99  101  104   CO2 22 - 32 mmol/L 24  25  25    Calcium 8.9 - 10.3 mg/dL 78.2  95.6  9.8   Total Protein 6.5 - 8.1 g/dL 8.4   7.7   Total Bilirubin 0.3 - 1.2 mg/dL 0.7   0.3   Alkaline Phos 38 - 126 U/L 95   90   AST 15 - 41 U/L 20   18   ALT 0 - 44 U/L 15   15      Lab Results  Component Value Date   CEA 2.8 06/30/2013   /  CEA  Date Value Ref Range Status  06/30/2013 2.8 0.0 - 5.0 ng/mL Final   No results found for: "PSA1" No results found for: "CAN199" No results found for: "CAN125"  Lab Results  Component Value Date   TOTALPROTELP 7.5 12/25/2022   TOTALPROTELP 7.6 12/25/2022   ALBUMINELP 3.2 12/25/2022   A1GS 0.5 (H) 12/25/2022   A2GS 1.2 (H) 12/25/2022   BETS 0.9 12/25/2022   GAMS 1.7 12/25/2022   MSPIKE 1.2 (H) 12/25/2022   SPEI Comment 12/25/2022   Lab Results  Component Value Date   TIBC 247 (L) 12/25/2022   TIBC 287 10/05/2022   TIBC 389 09/07/2022   FERRITIN 591 (H) 12/25/2022   FERRITIN 392 (H) 10/05/2022   FERRITIN 14 (L)  09/07/2022   IRONPCTSAT 6 (L) 12/25/2022   IRONPCTSAT 25 10/05/2022   IRONPCTSAT 7 (L) 09/07/2022   Lab Results  Component Value Date   LDH 89 (L) 12/25/2022   LDH 79 (L) 09/14/2022   LDH 85 (L) 07/17/2022     STUDIES:   CT LIVER ABD W/WO  Result Date: 12/22/2022 CLINICAL DATA:  Remote history of indeterminate liver masses on MRI. History of MGUS per recent PET-CT report. EXAM: CT ABDOMEN WITHOUT AND WITH CONTRAST TECHNIQUE: Multidetector CT imaging of the abdomen was performed following the standard protocol before and following the bolus administration of intravenous contrast. RADIATION DOSE REDUCTION: This exam was performed according to the departmental dose-optimization program which includes automated exposure control, adjustment of the mA and/or kV according to patient size and/or use of iterative reconstruction technique. CONTRAST:  OMNIPAQUE IOHEXOL 300 MG/ML  SOLN COMPARISON:  10/08/2022 PET-CT.  09/16/2013 MRI abdomen. FINDINGS: Lower chest: Solid 0.5 cm posterior basilar left lower lobe pulmonary nodule (series 8/image 34), unchanged from 10/08/2022 PET-CT, new from 07/26/2015 chest CT. Hepatobiliary: Normal liver size. No definite liver surface irregularity. A few scattered small simple liver cysts, largest 1.5 cm in the segment 4 left liver (series 16/image 23), as seen and characterized on 09/19/2013 MRI. There is a flash filling 1.4 x 1.3 cm peripheral segment 5 right liver mass (series 6/image 53) with associated surrounding segmental mild hepatic steatosis, which demonstrates persistent slight hyperenhancement on delayed sequences, which measured 0.7 x 0.6 cm on  09/19/2013 MRI (series 15/image 51 on that MRI), most compatible with a hemangioma that has mildly increased in size over the intervening 9 years. The additional previously visualized enhancing solid peripheral segment 8 and segment 7 right liver lesions from 09/19/2013 MRI study are not discretely visualized on  today's CT abdomen study. Normal gallbladder with no radiopaque cholelithiasis. No biliary ductal dilatation. Pancreas: Normal, with no mass or duct dilation. Spleen: Normal size. No mass. Adrenals/Urinary Tract: Right adrenal 1.9 x 1.9 cm nodule with precontrast density 31 HU (series 11/image 39), stable size back to 07/21/2013 PET-CT, compatible with a lipid poor adenoma. Normal left adrenal. Nonobstructing 11 mm lower left renal stone. No hydronephrosis. Subcentimeter hypodense posterior upper left renal cortical lesion, too small to characterize, for which no follow-up imaging is recommended. No suspicious renal masses. Stomach/Bowel: Normal non-distended stomach. Surgical clip noted in the lumen of the transverse duodenum. Visualized small and large bowel is normal caliber, with no bowel wall thickening. Vascular/Lymphatic: Atherosclerotic nonaneurysmal abdominal aorta. Patent portal, splenic, hepatic and renal veins. No pathologically enlarged lymph nodes in the abdomen. Other: No pneumoperitoneum, ascites or focal fluid collection. Musculoskeletal: No aggressive appearing focal osseous lesions. Moderate thoracolumbar spondylosis. IMPRESSION: 1. No suspicious liver masses. Flash filling 1.4 cm peripheral segment 5 right liver mass with associated surrounding segmental mild hepatic steatosis, mildly increased in size from 0.7 cm on 2015 MRI, most compatible with a benign lesion favoring a small hemangioma. The additional previously visualized enhancing solid peripheral segment 8 and segment 7 right liver lesions from 09/19/2013 MRI study are not discretely visualized on today's CT abdomen study. 2. Stable right adrenal lipid poor adenoma, for which no follow-up imaging is recommended. 3. Nonobstructing left nephrolithiasis. 4. Solid 0.5 cm posterior basilar left lower lobe pulmonary nodule, unchanged from 10/08/2022 PET-CT, new from 07/26/2015 chest CT. Recommend attention on follow-up noncontrast chest CT in  6-12 months. 5.  Aortic Atherosclerosis (ICD10-I70.0). Electronically Signed   By: Delbert Phenix M.D.   On: 12/22/2022 12:00

## 2023-01-11 ENCOUNTER — Inpatient Hospital Stay: Payer: Medicare Other | Attending: Hematology | Admitting: Hematology

## 2023-01-11 ENCOUNTER — Telehealth: Payer: Self-pay | Admitting: Gastroenterology

## 2023-01-11 ENCOUNTER — Other Ambulatory Visit: Payer: Self-pay

## 2023-01-11 ENCOUNTER — Encounter: Payer: Self-pay | Admitting: Hematology

## 2023-01-11 VITALS — BP 122/87 | HR 75 | Temp 97.7°F | Resp 20 | Wt 228.7 lb

## 2023-01-11 DIAGNOSIS — D472 Monoclonal gammopathy: Secondary | ICD-10-CM | POA: Diagnosis not present

## 2023-01-11 DIAGNOSIS — N189 Chronic kidney disease, unspecified: Secondary | ICD-10-CM | POA: Diagnosis present

## 2023-01-11 DIAGNOSIS — I69954 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: Secondary | ICD-10-CM | POA: Diagnosis not present

## 2023-01-11 DIAGNOSIS — D509 Iron deficiency anemia, unspecified: Secondary | ICD-10-CM

## 2023-01-11 DIAGNOSIS — D631 Anemia in chronic kidney disease: Secondary | ICD-10-CM | POA: Insufficient documentation

## 2023-01-11 DIAGNOSIS — K319 Disease of stomach and duodenum, unspecified: Secondary | ICD-10-CM

## 2023-01-11 DIAGNOSIS — D500 Iron deficiency anemia secondary to blood loss (chronic): Secondary | ICD-10-CM | POA: Insufficient documentation

## 2023-01-11 DIAGNOSIS — C9000 Multiple myeloma not having achieved remission: Secondary | ICD-10-CM | POA: Diagnosis present

## 2023-01-11 DIAGNOSIS — Z87891 Personal history of nicotine dependence: Secondary | ICD-10-CM | POA: Diagnosis not present

## 2023-01-11 NOTE — Telephone Encounter (Signed)
Dr. Meridee Score,  Urgent referral in [place] for EUS due to 1.5 cm duodenal lesion from [facility].  Records in EPIC.  Please review and advise scheduling.  Thanks [employer]

## 2023-01-11 NOTE — Telephone Encounter (Signed)
Reviewed the EGD report from April. EGD/EUS nonurgent in next 3 months for follow-up of this lesion. Thanks. GM

## 2023-01-11 NOTE — Telephone Encounter (Signed)
EGD EUS has been scheduled for 04/01/23 at 245 pm at [facility] with GM

## 2023-01-11 NOTE — Telephone Encounter (Signed)
EUS scheduled, pt instructed and medications reviewed.  Patient instructions mailed to home.  Patient to call with any questions or concerns. The pt aware to let me know if he does not hear from our office within 2 weeks of procedure regarding Plavix.

## 2023-01-11 NOTE — Patient Instructions (Addendum)
[facility] Discharge Instructions   You were seen and examined today by Dr. Ellin Saba.  He reviewed the results of your lab work which are normal/stable. 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 Your calcium is slightly high. Stop taking Vitamin D supplements.   We will see you back in 3 months. We will repeat lab work prior to your next visit.    Thank you for choosing [facility] at [facility] to provide your oncology and hematology care.  To afford each patient quality time with our provider, please arrive at least 15 minutes before your scheduled appointment time.   If you have a lab appointment with the Cancer Center please come in thru the Main Entrance and check in at the main information desk.  You need to re-schedule your appointment should you arrive 10 or more minutes late.  We strive to give you quality time with our providers, and arriving late affects you and other patients whose appointments are after yours.  Also, if you no show three or more times for appointments you may be dismissed from the clinic at the providers discretion.     Again, thank you for choosing [facility].  Our hope is that these requests will decrease the amount of time that you wait before being seen by our physicians.       _____________________________________________________________  Should you have questions after your visit to [facility], please contact our office at [phone] and follow the prompts.  Our office hours are 8:00 a.m. and 4:30 p.m. Monday - Friday.  Please note that voicemails left after 4:00 p.m. may not be returned until the following business day.  We are closed weekends and major holidays.  You do have access to a nurse 24-7, just call the main number to the clinic [phone] and do not press any options, hold on the line and a nurse will answer the phone.    For  prescription refill requests, have your pharmacy contact our office and allow 72 hours.    Due to Covid, you will need to wear a mask upon entering the hospital. If you do not have a mask, a mask will be given to you at the Main Entrance upon arrival. For doctor visits, patients may have 1 support person age 12 or older with them. For treatment visits, patients can not have anyone with them due to social distancing guidelines and our immunocompromised population.

## 2023-02-01 ENCOUNTER — Inpatient Hospital Stay: Payer: Medicare Other

## 2023-02-01 VITALS — BP 120/70 | HR 69 | Temp 96.7°F | Resp 18

## 2023-02-01 DIAGNOSIS — D509 Iron deficiency anemia, unspecified: Secondary | ICD-10-CM

## 2023-02-01 DIAGNOSIS — D500 Iron deficiency anemia secondary to blood loss (chronic): Secondary | ICD-10-CM | POA: Diagnosis not present

## 2023-02-01 MED ORDER — CETIRIZINE HCL 10 MG PO TABS
10.0000 mg | ORAL_TABLET | Freq: Once | ORAL | Status: AC
  Administered 2023-02-01: 10 mg via ORAL
  Filled 2023-02-01: qty 1

## 2023-02-01 MED ORDER — SODIUM CHLORIDE 0.9 % IV SOLN: Freq: Once | INTRAVENOUS | Status: AC

## 2023-02-01 MED ORDER — ACETAMINOPHEN 325 MG PO TABS
650.0000 mg | ORAL_TABLET | Freq: Once | ORAL | Status: AC
  Administered 2023-02-01: 650 mg via ORAL
  Filled 2023-02-01: qty 2

## 2023-02-01 MED ORDER — SODIUM CHLORIDE 0.9 % IV SOLN
510.0000 mg | Freq: Once | INTRAVENOUS | Status: AC
  Administered 2023-02-01: 510 mg via INTRAVENOUS
  Filled 2023-02-01: qty 510

## 2023-02-01 NOTE — Progress Notes (Signed)
Patient presents today for iron infusion of Feraheme.  Patient is in satisfactory condition with no new complaints voiced.  Vital signs are stable.  We will proceed with infusion per provider orders.    Patient tolerated treatment well with no complaints voiced.  Patient left ambulatory in stable condition.  Vital signs stable at discharge.  Follow up as scheduled.    

## 2023-02-01 NOTE — Patient Instructions (Signed)
[facility]  Discharge Instructions: Thank you for choosing [facility] to provide your oncology and hematology care.  If you have a lab appointment with the Cancer Center - please note that after April 8th, 2024, all labs will be drawn in the cancer center.  You do not have to check in or register with the main entrance as you have in the past but will complete your check-in in the cancer center.  Wear comfortable clothing and clothing appropriate for easy access to any Portacath or PICC line.   We strive to give you quality time with your provider. You may need to reschedule your appointment if you arrive late (15 or more minutes).  Arriving late affects you and other patients whose appointments are after yours.  Also, if you miss three or more appointments without notifying the office, you may be dismissed from the clinic at the provider's discretion.      For prescription refill requests, have your pharmacy contact our office and allow 72 hours for refills to be completed.    Today you received the following: Feraheme.  Ferumoxytol Injection What is this medication? FERUMOXYTOL (FER ue MOX i tol) treats low levels of iron in your body (iron deficiency anemia). Iron is a mineral that plays an important role in making red blood cells, which carry oxygen from your lungs to the rest of your body. This medicine may be used for other purposes; ask your health care provider or pharmacist if you have questions. COMMON BRAND NAME(S): Feraheme What should I tell my care team before I take this medication? They need to know if you have any of these conditions: Anemia not caused by low iron levels High levels of iron in the blood Magnetic resonance imaging (MRI) test scheduled An unusual or allergic reaction to iron, other medications, foods, dyes, or preservatives Pregnant or trying to get pregnant Breastfeeding How should I use this medication? This medication  is injected into a vein. It is given by your care team in a hospital or clinic setting. Talk to your care team the use of this medication in children. Special care may be needed. Overdosage: If you think you have taken too much of this medicine contact a poison control center or emergency room at once. NOTE: This medicine is only for you. Do not share this medicine with others. What if I miss a dose? It is important not to miss your dose. Call your care team if you are unable to keep an appointment. What may interact with this medication? Other iron products This list may not describe all possible interactions. Give your health care provider a list of all the medicines, herbs, non-prescription drugs, or dietary supplements you use. Also tell them if you smoke, drink alcohol, or use illegal drugs. Some items may interact with your medicine. What should I watch for while using this medication? Visit your care team regularly. Tell your care team if your symptoms do not start to get better or if they get worse. You may need blood work done while you are taking this medication. You may need to follow a special diet. Talk to your care team. Foods that contain iron include: whole grains/cereals, dried fruits, beans, or peas, leafy green vegetables, and organ meats (liver, kidney). What side effects may I notice from receiving this medication? Side effects that you should report to your care team as soon as possible: Allergic reactions--skin rash, itching, hives, swelling of the face,   lips, tongue, or throat Low blood pressure--dizziness, feeling faint or lightheaded, blurry vision Shortness of breath Side effects that usually do not require medical attention (report to your care team if they continue or are bothersome): Flushing Headache Joint pain Muscle pain Nausea Pain, redness, or irritation at injection site This list may not describe all possible side effects. Call your doctor for medical  advice about side effects. You may report side effects to FDA at 1-800-FDA-1088. Where should I keep my medication? This medication is given in a hospital or clinic and will not be stored at home. NOTE: This sheet is a summary. It may not cover all possible information. If you have questions about this medicine, talk to your doctor, pharmacist, or health care provider.  2023 Elsevier/Gold Standard (2020-11-20 00:00:00)       To help prevent nausea and vomiting after your treatment, we encourage you to take your nausea medication as directed.  BELOW ARE SYMPTOMS THAT SHOULD BE REPORTED IMMEDIATELY: *FEVER GREATER THAN 100.4 F (38 C) OR HIGHER *CHILLS OR SWEATING *NAUSEA AND VOMITING THAT IS NOT CONTROLLED WITH YOUR NAUSEA MEDICATION *UNUSUAL SHORTNESS OF BREATH *UNUSUAL BRUISING OR BLEEDING *URINARY PROBLEMS (pain or burning when urinating, or frequent urination) *BOWEL PROBLEMS (unusual diarrhea, constipation, pain near the anus) TENDERNESS IN MOUTH AND THROAT WITH OR WITHOUT PRESENCE OF ULCERS (sore throat, sores in mouth, or a toothache) UNUSUAL RASH, SWELLING OR PAIN  UNUSUAL VAGINAL DISCHARGE OR ITCHING   Items with * indicate a potential emergency and should be followed up as soon as possible or go to the Emergency Department if any problems should occur.  Please show the CHEMOTHERAPY ALERT CARD or IMMUNOTHERAPY ALERT CARD at check-in to the Emergency Department and triage nurse.  Should you have questions after your visit or need to cancel or reschedule your appointment, please contact [facility] [phone]  and follow the prompts.  Office hours are 8:00 a.m. to 4:30 p.m. Monday - Friday. Please note that voicemails left after 4:00 p.m. may not be returned until the following business day.  We are closed weekends and major holidays. You have access to a nurse at all times for urgent questions. Please call the main number to the clinic [phone] and follow  the prompts.  For any non-urgent questions, you may also contact your provider using MyChart. We now offer e-Visits for anyone 18 and older to request care online for non-urgent symptoms. For details visit [email or web address].   Also download the MyChart app! Go to the app store, search "MyChart", open the app, select [place], and log in with your MyChart username and password.   

## 2023-02-22 NOTE — Progress Notes (Unsigned)
@Patient  ID: James Zuniga, male    DOB: Nov 28, 1955, 67 y.o.   MRN: [id number]  No chief complaint on file.   Referring provider: Iona Hansen, NP    Previous [facility] pulmonary encounter:  10/29/22- Dr. Craige Cotta  Sleep apnea. - he is compliant with CPAP and reports benefit - he uses [employer] for his DME - continue CPAP 11 cm H2O - will send order to change him to full face mask   COPD with emphysema and asthma. - he has financial assistance program for meds - will change to breo 200 one puff daily - continue prn albuterol  Allergic rhinitis. 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 - followed by Dr. Judithe Modest with cardiology at [facility]    02/23/2023- Interim hx  Patient presents today for 4 months follow-up.       No Known Allergies  Immunization History  Administered Date(s) Administered   Fluad Quad(high Dose 65+) 04/02/2021   Influenza Split 04/12/2012, 05/08/2013, 04/12/2017   Influenza,inj,Quad PF,6+ Mos 04/02/2014, 04/15/2015, 06/04/2015, 05/22/2016, 04/06/2019   Influenza-Unspecified 04/12/2012, 05/08/2013, 04/02/2014, 04/15/2015, 06/04/2015, 05/22/2016, 04/12/2017, 05/26/2017, 03/17/2018   Meningococcal polysaccharide vaccine (MPSV4) 07/13/1998   Moderna Sars-Covid-2 Vaccination 09/14/2019, 10/13/2019, 05/11/2020   Pneumococcal Conjugate-13 11/09/2011, 12/25/2020   Pneumococcal Polysaccharide-23 11/09/2011, 02/04/2015    Past Medical History:  Diagnosis Date   Arthritis    "all over" (08/24/2017)   CHF (congestive heart failure) (HCC)    Chronic airway obstruction (HCC)    Chronic lower back pain    Coronary artery disease    Disorder of liver    "spots on my liver; they've rechecked them; they haven't changed" (08/24/2017)   Dyspnea    Esophageal reflux    Gout    "on daily RX" (08/24/2017)   Hyperlipidemia    Hypertension    Hypokalemia 11/2016   Hypotestosteronism 07/01/2013   Insomnia    Iron deficiency anemia,  unspecified 07/01/2013   Memory loss    On home oxygen therapy    "2L prn" (08/24/2017)   OSA on CPAP    PVD (peripheral vascular disease) (HCC)    Renal disorder    Stroke (HCC) 01/2017   "left sided weakness remains" (08/24/2017)   Type II diabetes mellitus (HCC)    Vitamin D deficiency     Tobacco History: Social History   Tobacco Use  Smoking Status Former   Current packs/day: 0.00   Average packs/day: 0.5 packs/day for 40.0 years (20.0 ttl pk-yrs)   Types: Cigarettes   Start date: 08/05/1983   Quit date: 08/15/2017   Years since quitting: 5.5  Smokeless Tobacco Never   Counseling given: Not Answered   Outpatient Medications Prior to Visit  Medication Sig Dispense Refill   albuterol (VENTOLIN HFA) 108 (90 Base) MCG/ACT inhaler Inhale 2 puffs into the lungs every 6 (six) hours as needed for wheezing or shortness of breath. 18 g 3   allopurinol (ZYLOPRIM) 300 MG tablet Take 300 mg by mouth daily.     aspirin EC 81 MG tablet Take 81 mg by mouth daily. Swallow whole.     atorvastatin (LIPITOR) 80 MG tablet Take 1 tablet (80 mg total) by mouth every morning. 30 tablet 0   Blood Glucose Monitoring Suppl (FIFTY50 GLUCOSE METER 2.0) w/Device KIT To check blood sugars TID or Use as instructed Include strips. Lancets, lancet device, control solution. batteries     clopidogrel (PLAVIX) 75 MG tablet Take 1 tablet (75  mg total) by mouth daily. 30 tablet 2   colchicine 0.6 MG tablet Take 0.6 mg by mouth daily.     Cyanocobalamin (VITAMIN B-12 IJ) Inject 1,000 mg as directed See admin instructions. Every 30 days as needed     diclofenac Sodium (VOLTAREN) 1 % GEL Apply 2 g topically daily as needed (pain).     DULoxetine (CYMBALTA) 60 MG capsule Take 60 mg by mouth 2 (two) times daily.      fluticasone (FLONASE) 50 MCG/ACT nasal spray Place 1 spray into both nostrils daily. 16 g 2   fluticasone furoate-vilanterol (BREO ELLIPTA) 200-25 MCG/ACT AEPB Inhale 1 puff into the lungs daily. 1 each 5    furosemide (LASIX) 20 MG tablet Take 20 mg by mouth every Tuesday, Thursday, and Saturday at 6 PM.      gabapentin (NEURONTIN) 300 MG capsule Take 600 mg by mouth 3 (three) times daily.     glucose blood test strip 1 each by Misc.(Non-Drug; Combo Route) route daily.     Insulin Glargine (TOUJEO SOLOSTAR [place]) Inject 70 Units into the skin daily with supper.     insulin NPH-regular Human (NOVOLIN 70/30) (70-30) 100 UNIT/ML injection Please inject 25 units twice daily before breakfast and dinner. (Patient taking differently: Inject 25 Units into the skin 2 (two) times daily with a meal. Sliding scale) 10 mL 3   Insulin Pen Needle (FIFTY50 PEN NEEDLES) 32G X 4 MM MISC 1 each by Misc.(Non-Drug; Combo Route) route daily.     ipratropium-albuterol (DUONEB) 0.5-2.5 (3) MG/3ML SOLN USE 1 VIAL IN NEBULIZER 4 TIMES DAILY (Patient taking differently: Take 3 mLs by nebulization 4 (four) times daily.) 360 mL 5   Lancets (ONETOUCH ULTRASOFT) lancets USE 1 LANCET TO CHECK GLUCOSE IN THE MORNING BEFORE BREAKFAST  11   losartan (COZAAR) 100 MG tablet Take 100 mg by mouth daily. (Patient not taking: Reported on 01/11/2023)     meclizine (ANTIVERT) 12.5 MG tablet Take 1 tablet (12.5 mg total) by mouth 3 (three) times daily as needed for dizziness. 20 tablet 0   methocarbamol (ROBAXIN) 500 MG tablet Take 500 mg by mouth 3 (three) times daily as needed for muscle spasms.     metoprolol succinate (TOPROL-XL) 100 MG 24 hr tablet Take 100 mg by mouth daily.     pantoprazole (PROTONIX) 20 MG tablet Take 20 mg by mouth daily.     potassium chloride SA (K-DUR,KLOR-CON) 20 MEQ tablet Take 1 tablet (20 mEq total) by mouth 2 (two) times daily. (Patient taking differently: Take 20 mEq by mouth 3 (three) times daily. Take 1 tablet (20 meq) scheduled three times daily & takes an additional tablet at lunch on Tuesdays, Thursdays, & Saturdays due to lasix) 30 tablet 3   primidone (MYSOLINE) 50 MG tablet Take 100 mg by mouth at bedtime.       spironolactone (ALDACTONE) 25 MG tablet Take 25 mg by mouth every Monday, Wednesday, and Friday.     tamsulosin (FLOMAX) 0.4 MG CAPS capsule Take 1 capsule (0.4 mg total) by mouth daily after breakfast. (Patient taking differently: Take 0.4 mg by mouth at bedtime.) 30 capsule 12   tiZANidine (ZANAFLEX) 4 MG tablet Take 4 mg by mouth 3 (three) times daily as needed for muscle spasms.   0   traMADol (ULTRAM) 50 MG tablet Take 50 mg by mouth daily as needed for moderate pain.      zolpidem (AMBIEN CR) 12.5 MG CR tablet Take 12.5 mg by mouth at  bedtime.     No facility-administered medications prior to visit.      Review of Systems  Review of Systems   Physical Exam  There were no vitals taken for this visit. Physical Exam   Lab Results:  CBC    Component Value Date/Time   WBC 9.7 12/25/2022 1300   RBC 4.54 12/25/2022 1300   HGB 11.7 (L) 12/25/2022 1300   HGB 12.1 (L) 11/25/2021 1426   HGB 15.2 12/18/2016 0804   HCT 38.6 (L) 12/25/2022 1300   HCT 46.5 12/18/2016 0804   PLT 266 12/25/2022 1300   PLT 285 11/25/2021 1426   PLT 291 12/18/2016 0804   MCV 85.0 12/25/2022 1300   MCV 91 12/18/2016 0804   MCH 25.8 (L) 12/25/2022 1300   MCHC 30.3 12/25/2022 1300   RDW 17.4 (H) 12/25/2022 1300   RDW 14.7 12/18/2016 0804   LYMPHSABS 1.1 12/25/2022 1300   LYMPHSABS 2.0 12/18/2016 0804   MONOABS 0.6 12/25/2022 1300   EOSABS 0.1 12/25/2022 1300   EOSABS 0.2 12/18/2016 0804   BASOSABS 0.0 12/25/2022 1300   BASOSABS 0.0 12/18/2016 0804    BMET    Component Value Date/Time   NA 134 (L) 12/25/2022 1300   NA 141 09/25/2016 0809   K 3.3 (L) 12/25/2022 1300   K 2.8 (LL) 09/25/2016 0809   CL 99 12/25/2022 1300   CL 102 07/31/2016 0815   CO2 24 12/25/2022 1300   CO2 28 09/25/2016 0809   GLUCOSE 249 (H) 12/25/2022 1300   GLUCOSE 116 09/25/2016 0809   GLUCOSE 128 (H) 07/31/2016 0815   BUN 16 12/25/2022 1300   BUN 13.1 09/25/2016 0809   CREATININE 1.51 (H) 12/25/2022 1300    CREATININE 1.50 (H) 11/25/2021 1426   CREATININE 1.2 09/25/2016 0809   CALCIUM 10.3 12/25/2022 1300   CALCIUM 9.5 09/25/2016 0809   GFRNONAA 50 (L) 12/25/2022 1300   GFRNONAA 51 (L) 11/25/2021 1426   GFRAA 56 (L) 03/25/2020 1309    BNP No results found for: "BNP"  ProBNP No results found for: "PROBNP"  Imaging: No results found.   Assessment & Plan:   No problem-specific Assessment & Plan notes found for this encounter.     Glenford Bayley, NP 02/22/2023

## 2023-02-23 ENCOUNTER — Ambulatory Visit: Payer: Medicare Other | Admitting: Primary Care

## 2023-02-23 ENCOUNTER — Encounter: Payer: Self-pay | Admitting: Primary Care

## 2023-02-23 VITALS — BP 118/78 | HR 70 | Temp 98.0°F | Ht 66.0 in | Wt 232.4 lb

## 2023-02-23 DIAGNOSIS — G4733 Obstructive sleep apnea (adult) (pediatric): Secondary | ICD-10-CM | POA: Diagnosis not present

## 2023-02-23 MED ORDER — FLUTICASONE FUROATE-VILANTEROL 200-25 MCG/ACT IN AEPB: 1.0000 | INHALATION_SPRAY | Freq: Every day | RESPIRATORY_TRACT | 5 refills | Status: DC

## 2023-02-23 NOTE — Patient Instructions (Addendum)
Recommendations Continue to wear CPAP nightly 6 hours or longer Please fill out patient assistance for [facility] / and prescription has been sent to your pharmacy  Orders: New CPAP machine and mask fitting with [facility] (ordered)  Rx: Breo one puff daily   Follow-up 3 months with Waynetta Sandy NP

## 2023-02-23 NOTE — Assessment & Plan Note (Addendum)
-   COPD with asthma and emphysema. No acutely exacerbated. Using SABA 1-2 times a day.  - Patient reports clinical benefit while using BREO daily but ran out of medication  - Needs to re-apply for patient assistance with [employer] - refills sent to pharmacy and given paperwork  - Continue Albuterol 2 puffs every 4-6 hours for shortness of breath/wheezing

## 2023-02-23 NOTE — Progress Notes (Signed)
Reviewed and agree with assessment/plan.   Coralyn Helling, MD [facility] Pulmonary/Critical Care 02/23/2023, 10:33 AM Pager:  [phone]

## 2023-02-23 NOTE — Assessment & Plan Note (Signed)
-   Patient is 80% compliant with CPAP use over the last 30 days. Current pressure 11cm h20; AHI 2.3/hour. Needs order for new CPAP machine and mask fitting with DME. Continue to encourage patient aim to wear CPAP nightly 4-6 hours or longer.

## 2023-02-26 ENCOUNTER — Telehealth: Payer: Self-pay | Admitting: Primary Care

## 2023-03-02 ENCOUNTER — Telehealth: Payer: Self-pay | Admitting: Primary Care

## 2023-03-02 NOTE — Telephone Encounter (Signed)
The order has been faxed to the correct DME [facility] Fax # [phone]

## 2023-03-02 NOTE — Telephone Encounter (Signed)
PT presents to the front desk saying the RX for his CPAP went to the wrong location.[facility] does not accept his ins. ([employer] & Medicare) even though that is what AV[facility] says. )  RX should go to [facility] [address] [facility] [address]  Attn: Etter Sjogren   Melissa's number is [phone] Fax is [phone] (Her business card is for [facility])

## 2023-03-05 ENCOUNTER — Telehealth: Payer: Self-pay | Admitting: Primary Care

## 2023-03-05 DIAGNOSIS — G4733 Obstructive sleep apnea (adult) (pediatric): Secondary | ICD-10-CM

## 2023-03-05 NOTE — Telephone Encounter (Signed)
Pt called in bc his CPAP was sent to [facility], Pt states he owes them a balance and the rep from [facility] was very rude and will like his order replaced  to a DME company in [place].

## 2023-03-08 ENCOUNTER — Telehealth: Payer: Self-pay

## 2023-03-08 NOTE — Telephone Encounter (Signed)
-----   Message from Nurse Kyeisha Janowicz P sent at 01/11/2023  3:31 PM EDT ----- Make sure to have Plavix hold

## 2023-03-08 NOTE — Telephone Encounter (Signed)
I have spoken to the pt and wife and he tells me that he did hear from cardiology at [facility] that he can hold plavix 5 days prior to 9/19//24 EUS.  I have resent a letter to Dr Sampson Goon to get this in writing for the chart.

## 2023-03-12 NOTE — Telephone Encounter (Signed)
Updated order placed.

## 2023-03-18 ENCOUNTER — Encounter: Payer: Self-pay | Admitting: Hematology

## 2023-03-24 ENCOUNTER — Encounter (HOSPITAL_COMMUNITY): Payer: Self-pay | Admitting: Gastroenterology

## 2023-03-24 ENCOUNTER — Encounter: Payer: Self-pay | Admitting: Hematology

## 2023-04-01 ENCOUNTER — Ambulatory Visit (HOSPITAL_COMMUNITY): Payer: Medicare HMO | Admitting: Anesthesiology

## 2023-04-01 ENCOUNTER — Ambulatory Visit (HOSPITAL_COMMUNITY)
Admission: RE | Admit: 2023-04-01 | Discharge: 2023-04-01 | Disposition: A | Discharge status: Home / Self Care | Payer: Medicare HMO | Attending: Gastroenterology | Admitting: Gastroenterology

## 2023-04-01 ENCOUNTER — Other Ambulatory Visit: Payer: Self-pay

## 2023-04-01 ENCOUNTER — Encounter (HOSPITAL_COMMUNITY): Payer: Self-pay | Admitting: Gastroenterology

## 2023-04-01 ENCOUNTER — Encounter (HOSPITAL_COMMUNITY)
Admission: RE | Disposition: A | Discharge status: Home / Self Care | Payer: Self-pay | Source: Home / Self Care | Attending: Gastroenterology

## 2023-04-01 DIAGNOSIS — K219 Gastro-esophageal reflux disease without esophagitis: Secondary | ICD-10-CM | POA: Diagnosis not present

## 2023-04-01 DIAGNOSIS — I10 Essential (primary) hypertension: Secondary | ICD-10-CM

## 2023-04-01 DIAGNOSIS — K838 Other specified diseases of biliary tract: Secondary | ICD-10-CM | POA: Insufficient documentation

## 2023-04-01 DIAGNOSIS — E1151 Type 2 diabetes mellitus with diabetic peripheral angiopathy without gangrene: Secondary | ICD-10-CM | POA: Insufficient documentation

## 2023-04-01 DIAGNOSIS — I110 Hypertensive heart disease with heart failure: Secondary | ICD-10-CM | POA: Diagnosis not present

## 2023-04-01 DIAGNOSIS — G4733 Obstructive sleep apnea (adult) (pediatric): Secondary | ICD-10-CM | POA: Diagnosis not present

## 2023-04-01 DIAGNOSIS — D132 Benign neoplasm of duodenum: Secondary | ICD-10-CM

## 2023-04-01 DIAGNOSIS — K317 Polyp of stomach and duodenum: Secondary | ICD-10-CM | POA: Insufficient documentation

## 2023-04-01 DIAGNOSIS — K2289 Other specified disease of esophagus: Secondary | ICD-10-CM | POA: Diagnosis not present

## 2023-04-01 DIAGNOSIS — K2980 Duodenitis without bleeding: Secondary | ICD-10-CM | POA: Diagnosis not present

## 2023-04-01 DIAGNOSIS — K3189 Other diseases of stomach and duodenum: Secondary | ICD-10-CM | POA: Diagnosis present

## 2023-04-01 DIAGNOSIS — K2970 Gastritis, unspecified, without bleeding: Secondary | ICD-10-CM

## 2023-04-01 DIAGNOSIS — T183XXA Foreign body in small intestine, initial encounter: Secondary | ICD-10-CM | POA: Insufficient documentation

## 2023-04-01 DIAGNOSIS — W449XXA Unspecified foreign body entering into or through a natural orifice, initial encounter: Secondary | ICD-10-CM | POA: Insufficient documentation

## 2023-04-01 DIAGNOSIS — Z794 Long term (current) use of insulin: Secondary | ICD-10-CM | POA: Diagnosis not present

## 2023-04-01 DIAGNOSIS — I2510 Atherosclerotic heart disease of native coronary artery without angina pectoris: Secondary | ICD-10-CM | POA: Insufficient documentation

## 2023-04-01 DIAGNOSIS — K869 Disease of pancreas, unspecified: Secondary | ICD-10-CM | POA: Diagnosis not present

## 2023-04-01 DIAGNOSIS — Z87891 Personal history of nicotine dependence: Secondary | ICD-10-CM | POA: Diagnosis not present

## 2023-04-01 DIAGNOSIS — J449 Chronic obstructive pulmonary disease, unspecified: Secondary | ICD-10-CM | POA: Diagnosis not present

## 2023-04-01 DIAGNOSIS — Z8673 Personal history of transient ischemic attack (TIA), and cerebral infarction without residual deficits: Secondary | ICD-10-CM | POA: Insufficient documentation

## 2023-04-01 DIAGNOSIS — I509 Heart failure, unspecified: Secondary | ICD-10-CM | POA: Diagnosis not present

## 2023-04-01 DIAGNOSIS — K7689 Other specified diseases of liver: Secondary | ICD-10-CM | POA: Insufficient documentation

## 2023-04-01 HISTORY — PX: ESOPHAGOGASTRODUODENOSCOPY (EGD) WITH PROPOFOL: SHX5813

## 2023-04-01 HISTORY — PX: BIOPSY: SHX5522

## 2023-04-01 HISTORY — PX: HEMOSTASIS CLIP PLACEMENT: SHX6857

## 2023-04-01 HISTORY — PX: POLYPECTOMY: SHX5525

## 2023-04-01 HISTORY — PX: EUS: SHX5427

## 2023-04-01 LAB — GLUCOSE, CAPILLARY: Glucose-Capillary: 118 mg/dL — ABNORMAL HIGH (ref 70–99)

## 2023-04-01 SURGERY — ESOPHAGOGASTRODUODENOSCOPY (EGD) WITH PROPOFOL
Anesthesia: Monitor Anesthesia Care

## 2023-04-01 MED ORDER — PANTOPRAZOLE SODIUM 20 MG PO TBEC: 20.0000 mg | DELAYED_RELEASE_TABLET | Freq: Two times a day (BID) | ORAL | 6 refills | Status: DC

## 2023-04-01 MED ORDER — PROPOFOL 500 MG/50ML IV EMUL
INTRAVENOUS | Status: DC | PRN
  Administered 2023-04-01: 75 ug/kg/min via INTRAVENOUS

## 2023-04-01 MED ORDER — LACTATED RINGERS IV SOLN: INTRAVENOUS | Status: DC | PRN

## 2023-04-01 MED ORDER — CLOPIDOGREL BISULFATE 75 MG PO TABS: 75.0000 mg | ORAL_TABLET | Freq: Every day | ORAL | Status: AC

## 2023-04-01 MED ORDER — LIDOCAINE HCL (CARDIAC) PF 100 MG/5ML IV SOSY
PREFILLED_SYRINGE | INTRAVENOUS | Status: DC | PRN
  Administered 2023-04-01: 80 mg via INTRATRACHEAL

## 2023-04-01 MED ORDER — LACTATED RINGERS IV SOLN
INTRAVENOUS | Status: AC | PRN
Start: 2023-04-01 — End: 2023-04-01
  Administered 2023-04-01: 1000 mL via INTRAVENOUS

## 2023-04-01 MED ORDER — PROPOFOL 10 MG/ML IV BOLUS
INTRAVENOUS | Status: DC | PRN
  Administered 2023-04-01: 20 mg via INTRAVENOUS
  Administered 2023-04-01: 30 mg via INTRAVENOUS

## 2023-04-01 MED ORDER — PHENYLEPHRINE HCL (PRESSORS) 10 MG/ML IV SOLN
INTRAVENOUS | Status: DC | PRN
  Administered 2023-04-01 (×3): 80 ug via INTRAVENOUS

## 2023-04-01 MED ORDER — EPHEDRINE SULFATE (PRESSORS) 50 MG/ML IJ SOLN
INTRAMUSCULAR | Status: DC | PRN
  Administered 2023-04-01 (×3): 10 mg via INTRAVENOUS

## 2023-04-01 SURGICAL SUPPLY — 15 items

## 2023-04-01 NOTE — Op Note (Signed)
[facility] Patient Name: James Zuniga Procedure Date: 04/01/2023 MRN: [id number] Attending MD: Corliss Parish , MD, [id number] Date of Birth: 12/11/1955 CSN: [id number] Age: 67 Admit Type: Outpatient Procedure:                Upper EUS Indications:              Duodenal deformity on endoscopy/Subepithelial tumor                            vs. extrinsic compression Providers:                Corliss Parish, MD, Margaree Mackintosh, RN,                            Harrington Challenger, Technician Referring MD:             Gennette Pac, MD, Iona Hansen Medicines:                Monitored Anesthesia Care Complications:            No immediate complications. Estimated Blood Loss:     Estimated blood loss was minimal. Procedure:                Pre-Anesthesia Assessment:                           - Prior to the procedure, a History and Physical                            was performed, and patient medications and                            allergies were reviewed. The patient's tolerance of                            previous anesthesia was also reviewed. The risks                            and benefits of the procedure and the sedation                            options and risks were discussed with the patient.                            All questions were answered, and informed consent                            was obtained. Prior Anticoagulants: The patient has                            taken Plavix (clopidogrel), last dose was 5 days                            prior to procedure. ASA Grade Assessment: III - A  patient with severe systemic disease. After                            reviewing the risks and benefits, the patient was                            deemed in satisfactory condition to undergo the                            procedure.                           After obtaining informed consent, the endoscope was                             passed under direct vision. Throughout the                            procedure, the patient's blood pressure, pulse, and                            oxygen saturations were monitored continuously. The                            [id number] ([id number]) Olympus therapeutic endoscope                            was introduced through the mouth, and advanced to                            the second part of duodenum. The [id number]                            ([id number]) Olympus duodenoscope was introduced                            through the mouth, and advanced to the area of                            papilla. The [id number]-[id number] ([id number]) Olympus radial                            ultrasound scope was introduced through the mouth,                            and advanced to the duodenum for ultrasound                            examination from the stomach and duodenum. The                            upper EUS was accomplished without difficulty. The  patient tolerated the procedure. Scope In: Scope Out: Findings:      ENDOSCOPIC FINDING: :      No gross lesions were noted in the entire esophagus.      The Z-line was irregular and was found 41 cm from the incisors.      Patchy mildly erythematous mucosa without bleeding was found in the       entire examined stomach. Biopsies were taken with a cold forceps for       histology and Helicobacter pylori testing.      A single 8 mm sessile polyp was found in the duodenal bulb apoex. The       polyp was removed with a cold snare. Resection and retrieval were       complete. To prevent bleeding after the polypectomy, one hemostatic clip       was successfully placed (MR conditional). Clip manufacturer: [employer]. There was no bleeding at the end of the procedure.      A single 6 mm sessile polyp was found in the second portion of the       duodenum. The polyp was removed with a cold snare. Resection and        retrieval were complete. To prevent bleeding after the polypectomy, one       hemostatic clip was successfully placed (MR conditional). Clip       manufacturer: [employer]. There was no bleeding at the end of the       procedure.      A foreign body (previous hemoclip) was found in the second portion of       the duodenum and there appeared to be persisting evidence or recurrent       adenomatous tissue in the region. The hemoclip was removed and the       polypoid tissue from the region with a cold snare. Resection and       retrieval were complete. To prevent bleeding after the polypectomy, one       hemostatic clip was successfully placed (MR conditional). Clip       manufacturer: [employer]. There was no bleeding at the end of the       procedure.      A prominent congested minor papilla was noted.      A prominent major papilla was noted with large intraduodenal portion.      A single 6 mm sessile polyp was found in the third portion of the       duodenum. The polyp was removed with a cold snare. Resection and       retrieval were complete. To prevent bleeding post-intervention, one       hemostatic clip was successfully placed (MR conditional). Clip       manufacturer: [employer]. There was no bleeding at the end of the       procedure.      ENDOSONOGRAPHIC FINDING: :      Localized wall thickening was visualized endosonographically at the       minor papilla. This appeared to primarily be due to thickening within       the deep mucosa (Layer 2). The thickness was 10 mm.      An intramural thickening was found at the ampulla. The area was       hypoechoic. Endosonographically, the lesion appeared to originate from       within the submucosa (Layer 3).  The outer margins were smooth. Looking       with EUS Linear however, I could not visualize a true lesion for       sampling purposes.      Pancreatic parenchymal abnormalities were noted in the pancreatic  head,       genu of the pancreas, pancreatic body and pancreatic tail. These       consisted of hyperechoic foci with shadowing, lobularity without       honeycombing and hyperechoic strands.      There was prominence of the pancreatic duct from the head throughout.       The diameter of the main pancreatic duct (MPD) measured:      - HOP 6.1 -> 4.4 mm (head of pancreas)      - NOP 2.7 mm (neck of pancreas)      - BOP 2.8 mm (body of the pancreas)      - TOP 2.6 mm (tail of the pancreas)      There was prominence of the common bile duct at 7 mm and in the common       hepatic duct at 7.6 mm. An unremarkable gallbladder was identified.      No malignant-appearing lymph nodes were visualized in the celiac region       (level 20), peripancreatic region and porta hepatis region.      A cyst was found in the visualized portion of the liver. The cyst was       anechoic. It was without septae. The outer wall of the lesion was not       seen. No other abnormalities noted.      The celiac region was visualized. Impression:               EGD Impression:                           - No gross lesions in the entire esophagus. Z-line                            irregular, 41 cm from the incisors.                           - Erythematous mucosa in the stomach. Biopsied.                           - A single duodenal polyp at apex. Resected and                            retrieved. Clip (MR conditional) was placed. Clip                            manufacturer: [employer].                           - A single duodenal polyp at D2. Resected and                            retrieved. Clip (MR conditional) was placed. Clip  manufacturer: [employer].                           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                            Hemoclip removed and further tissue removed.                             Resected and retrieved. Clip (MR conditional) was                            placed. Clip manufacturer: [employer].                           - Congested and prominent minor papilla noted.                           - Prominent major papilla with large intraduodenal                            portion.                           - A single duodenal polyp at D3. Resected and                            retrieved. Clip (MR conditional) was placed. Clip                            manufacturer: [employer].                           EUS Impression:                           - Wall thickening was seen at the minor papilla. It                            appeared to primarily be within the deep mucosa                            (Layer 2).                           - A wall thickening more than an intramural lesion                            was found at the major papilla. This appeared to                            originate from within the submucosa (Layer 3).                           -  Pancreatic parenchymal abnormalities consisting                            of hyperechoic foci, lobularity and hyperechoic                            strands were noted in the pancreatic head, genu of                            the pancreas, pancreatic body and pancreatic tail.                           - Main pancreatic duct (MPD) diameter was measured.                            Endosonographically, the MPD had a dilated                            appearance throughout.                           - There was prominence of the common bile duct and                            in the common hepatic duct.                           - No malignant-appearing lymph nodes were                            visualized in the celiac region (level 20),                            peripancreatic region and porta hepatis region.                           - A cyst was found in the visualized  portion of the                            liver. No other abnormalities noted. Moderate Sedation:      Not Applicable - Patient had care per Anesthesia. Recommendation:           - The patient will be observed post-procedure,                            until all discharge criteria are met.                           - Discharge patient to home.                           - Patient has a contact number available for                            emergencies. The  signs and symptoms of potential                            delayed complications were discussed with the                            patient. Return to normal activities tomorrow.                            Written discharge instructions were provided to the                            patient.                           - Resume previous diet.                           - Observe patient's clinical course.                           - Await path results.                           - Recommend patient undergo a MRI/MRCP (if loop                            recorder/AICD is compatible) vs Pancreas Protocol                            CTAbdomen in 1-year to follow up the findings from                            EUS today.                           - Recommend Primary GI also ask that Radiology                            re-review the CT scan from earlier this year,                            because there does appear to be prominence of the                            PD that was not described in the report, to see if                            that correlates with findings from today's                            examination.                           - If patient develops pancreatitis then updated  imaging sooner makes sense.                           - If LFT pattern becomes concerning for obstruction                            then earlier imaging makes sense.                           - Recommend primary GI perform  EGD in 1-year for                            followup of previous adenoma removal (and depending                            on pathology from today for the lesions that were                            removed today).                           - Continue present medications.                           - Increase PPI to 20 mg BID for 18-months.                           - May restart Plavix on 9/21 to decrease risk of                            bleeding post-intervention.                           - The findings and recommendations were discussed                            with the patient.                           - The findings and recommendations were discussed                            with the patient's family. Procedure Code(s):        --- Professional ---                           [phone], Esophagogastroduodenoscopy, flexible,                            transoral; with removal of tumor(s), polyp(s), or                            other lesion(s) by snare technique                           43237, Esophagogastroduodenoscopy, flexible,  transoral; with endoscopic ultrasound examination                            limited to the esophagus, stomach or duodenum, and                            adjacent structures                           43239, 59, Esophagogastroduodenoscopy, flexible,                            transoral; with biopsy, single or multiple Diagnosis Code(s):        --- Professional ---                           K22.89, Other specified disease of esophagus                           K31.89, Other diseases of stomach and duodenum                           K31.7, Polyp of stomach and duodenum                           T18.3XXA, Foreign body in small intestine, initial                            encounter                           K83.8, Other specified diseases of biliary tract                           K86.9, Disease of pancreas, unspecified                            I89.9, Noninfective disorder of lymphatic vessels                            and lymph nodes, unspecified                           K76.89, Other specified diseases of liver                           R93.3, Abnormal findings on diagnostic imaging of                            other parts of digestive tract CPT copyright 2022 [employer]. All rights reserved. The codes documented in this report are preliminary and upon coder review may  be revised to meet current compliance requirements. Corliss Parish, MD 04/01/2023 2:13:38 PM Number of Addenda: 0

## 2023-04-01 NOTE — Discharge Instructions (Signed)

## 2023-04-01 NOTE — Transfer of Care (Signed)
Immediate Anesthesia Transfer of Care Note  Patient: James Zuniga  Procedure(s) Performed: ESOPHAGOGASTRODUODENOSCOPY (EGD) WITH PROPOFOL UPPER ENDOSCOPIC ULTRASOUND (EUS) RADIAL POLYPECTOMY BIOPSY  Patient Location: PACU  Anesthesia Type:MAC  Level of Consciousness: awake and alert   Airway & Oxygen Therapy: Patient Spontanous Breathing  Post-op Assessment: Report given to RN and Post -op Vital signs reviewed and stable  Post vital signs: Reviewed and stable  Last Vitals:  Vitals Value Taken Time  BP 97/70 04/01/23 1355  Temp 36.3 C 04/01/23 1350  Pulse 67 04/01/23 1359  Resp 20 04/01/23 1359  SpO2 91 % 04/01/23 1359  Vitals shown include unfiled device data.  Last Pain:  Vitals:   04/01/23 1350  TempSrc: Temporal  PainSc: 0-No pain         Complications: No notable events documented.

## 2023-04-01 NOTE — Anesthesia Postprocedure Evaluation (Signed)
Anesthesia Post Note  Patient: James Zuniga  Procedure(s) Performed: ESOPHAGOGASTRODUODENOSCOPY (EGD) WITH PROPOFOL UPPER ENDOSCOPIC ULTRASOUND (EUS) RADIAL POLYPECTOMY BIOPSY     Patient location during evaluation: PACU Anesthesia Type: MAC Level of consciousness: awake and alert Pain management: pain level controlled Vital Signs Assessment: post-procedure vital signs reviewed and stable Respiratory status: spontaneous breathing, nonlabored ventilation and respiratory function stable Cardiovascular status: stable and blood pressure returned to baseline Anesthetic complications: no   No notable events documented.  Last Vitals:  Vitals:   04/01/23 1355 04/01/23 1400  BP: 97/70 91/69  Pulse: 70 68  Resp: 18 18  Temp:    SpO2: 93% 94%    Last Pain:  Vitals:   04/01/23 1400  TempSrc:   PainSc: 4                  Beryle Lathe

## 2023-04-01 NOTE — H&P (Signed)
GASTROENTEROLOGY PROCEDURE H&P NOTE   Primary Care Physician: Iona Hansen, NP  HPI: James Zuniga is a 67 y.o. male who presents for EGD/EUS to evaluate recently completed EGD-verbal with findings of a duodenal nodule.  Past Medical History:  Diagnosis Date   Arthritis    "all over" (08/24/2017)   CHF (congestive heart failure) (HCC)    Chronic airway obstruction (HCC)    Chronic lower back pain    Coronary artery disease    Disorder of liver    "spots on my liver; they've rechecked them; they haven't changed" (08/24/2017)   Dyspnea    Esophageal reflux    Gout    "on daily RX" (08/24/2017)   Hyperlipidemia    Hypertension    Hypokalemia 11/2016   Hypotestosteronism 07/01/2013   Insomnia    Iron deficiency anemia, unspecified 07/01/2013   Memory loss    On home oxygen therapy    "2L prn" (08/24/2017)   OSA on CPAP    PVD (peripheral vascular disease) (HCC)    Renal disorder    Stroke (HCC) 01/2017   "left sided weakness remains" (08/24/2017)   Type II diabetes mellitus (HCC)    Vitamin D deficiency    Past Surgical History:  Procedure Laterality Date   BIOPSY  11/06/2022   Procedure: BIOPSY;  Surgeon: Corbin Ade, MD;  Location: [facility];  Service: Endoscopy;;   COLONOSCOPY N/A 08/26/2017   Procedure: COLONOSCOPY;  Surgeon: Iva Boop, MD;  Location: [facility] ENDOSCOPY;  Service: Endoscopy;  Laterality: N/A;   COLONOSCOPY WITH PROPOFOL N/A 11/06/2022   Procedure: COLONOSCOPY WITH PROPOFOL;  Surgeon: Corbin Ade, MD;  Location: [facility];  Service: Endoscopy;  Laterality: N/A;  1:15 PM   ENTEROSCOPY N/A 05/24/2018   Procedure: ENTEROSCOPY;  Surgeon: Shellia Cleverly, DO;  Location: [facility] ENDOSCOPY;  Service: Gastroenterology;  Laterality: N/A;   ESOPHAGOGASTRODUODENOSCOPY N/A 08/26/2017   Procedure: ESOPHAGOGASTRODUODENOSCOPY (EGD);  Surgeon: Iva Boop, MD;  Location: [facility] ENDOSCOPY;  Service: Endoscopy;  Laterality: N/A;   ESOPHAGOGASTRODUODENOSCOPY  (EGD) WITH PROPOFOL N/A 11/06/2022   Procedure: ESOPHAGOGASTRODUODENOSCOPY (EGD) WITH PROPOFOL;  Surgeon: Corbin Ade, MD;  Location: [facility];  Service: Endoscopy;  Laterality: N/A;   HOT HEMOSTASIS N/A 05/24/2018   Procedure: HOT HEMOSTASIS (ARGON PLASMA COAGULATION/BICAP);  Surgeon: Shellia Cleverly, DO;  Location: [facility] ENDOSCOPY;  Service: Gastroenterology;  Laterality: N/A;   KNEE ARTHROSCOPY Left    LOOP RECORDER IMPLANT  06/2017   POLYPECTOMY  05/24/2018   Procedure: POLYPECTOMY;  Surgeon: Shellia Cleverly, DO;  Location: [facility]OSCOPY;  Service: Gastroenterology;;   POLYPECTOMY  11/06/2022   Procedure: POLYPECTOMY;  Surgeon: Corbin Ade, MD;  Location: [facility];  Service: Endoscopy;;   TEE WITHOUT CARDIOVERSION N/A 12/24/2016   Procedure: TRANSESOPHAGEAL ECHOCARDIOGRAM (TEE);  Surgeon: Pricilla Riffle, MD;  Location: [facility] ENDOSCOPY;  Service: Cardiovascular;  Laterality: N/A;   No current facility-administered medications for this encounter.   No current facility-administered medications for this encounter. No Known Allergies Family History  Problem Relation Age of Onset   Heart disease Mother    Diabetes Father    Stroke Brother    Stroke Paternal Uncle    Diabetes Sister    Heart attack Brother    Heart attack Brother    Social History   Socioeconomic History   Marital status: Married    Spouse name: Not on file   Number of children: Not on file  Years of education: Not on file   Highest education level: Not on file  Occupational History   Occupation: diabled    Comment: as of early 12s  Tobacco Use   Smoking status: Former    Current packs/day: 0.00    Average packs/day: 0.5 packs/day for 40.0 years (20.0 ttl pk-yrs)    Types: Cigarettes    Start date: 08/05/1983    Quit date: 08/15/2017    Years since quitting: 5.6   Smokeless tobacco: Never  Vaping Use   Vaping status: Never Used  Substance and Sexual Activity   Alcohol use: No     Alcohol/week: 0.0 standard drinks of alcohol   Drug use: No   Sexual activity: Not Currently  Other Topics Concern   Not on file  Social History Narrative   Not on file   Social Determinants of Health   Financial Resource Strain: Not on file  Food Insecurity: Low Risk  (03/02/2023)   Received from [facility]   Hunger Vital Sign    Worried About Running Out of Food in the Last Year: Never true    Ran Out of Food in the Last Year: Never true  Transportation Needs: No Transportation Needs (07/17/2022)   PRAPARE - Administrator, Civil Service (Medical): No    Lack of Transportation (Non-Medical): No  Physical Activity: Not on file  Stress: Not on file  Social Connections: Not on file  Intimate Partner Violence: Not At Risk (07/17/2022)   Humiliation, Afraid, Rape, and Kick questionnaire    Fear of Current or Ex-Partner: No    Emotionally Abused: No    Physically Abused: No    Sexually Abused: No    Physical Exam: Today's Vitals   04/01/23 1155  BP: 113/76  Pulse: 82  Resp: 20  Temp: 97.9 F (36.6 C)  TempSrc: Tympanic  SpO2: 95%  Weight: 105.4 kg  Height: 5\' 6"  (1.676 m)  PainSc: 0-No pain   Body mass index is 37.5 kg/m. GEN: NAD EYE: Sclerae anicteric ENT: MMM CV: Non-tachycardic GI: Soft, NT/ND NEURO:  Alert & Oriented x 3  Lab Results: No results for input(s): "WBC", "HGB", "HCT", "PLT" in the last 72 hours. BMET No results for input(s): "NA", "K", "CL", "CO2", "GLUCOSE", "BUN", "CREATININE", "CALCIUM" in the last 72 hours. LFT No results for input(s): "PROT", "ALBUMIN", "AST", "ALT", "ALKPHOS", "BILITOT", "BILIDIR", "IBILI" in the last 72 hours. PT/INR No results for input(s): "LABPROT", "INR" in the last 72 hours.   Impression / Plan: This is a 67 y.o.male who presents for EGD/EUS to evaluate recently completed EGD-verbal with findings of a duodenal nodule.  The risks of an EUS including intestinal perforation, bleeding, infection,  aspiration, and medication effects were discussed as was the possibility it may not give a definitive diagnosis if a biopsy is performed.  When a biopsy of the pancreas is done as part of the EUS, there is an additional risk of pancreatitis at the rate of about 1-2%.  It was explained that procedure related pancreatitis is typically mild, although it can be severe and even life threatening, which is why we do not perform random pancreatic biopsies and only biopsy a lesion/area we feel is concerning enough to warrant the risk.   The risks and benefits of endoscopic evaluation/treatment were discussed with the patient and/or family; these include but are not limited to the risk of perforation, infection, bleeding, missed lesions, lack of diagnosis, severe illness requiring hospitalization, as well as anesthesia  and sedation related illnesses.  The patient's history has been reviewed, patient examined, no change in status, and deemed stable for procedure.  The patient and/or family is agreeable to proceed.    Corliss Parish, MD [facility]oscopy Office # [phone]

## 2023-04-01 NOTE — Anesthesia Preprocedure Evaluation (Addendum)
Anesthesia Evaluation  Patient identified by MRN, date of birth, ID band Patient awake    Reviewed: Allergy & Precautions, NPO status , Patient's Chart, lab work & pertinent test results  History of Anesthesia Complications Negative for: history of anesthetic complications  Airway Mallampati: II  TM Distance: >3 FB Neck ROM: Full    Dental   Pulmonary sleep apnea and Continuous Positive Airway Pressure Ventilation , COPD,  COPD inhaler, Patient abstained from smoking., former smoker   Pulmonary exam normal        Cardiovascular hypertension, Pt. on medications + CAD, + Peripheral Vascular Disease and +CHF  Normal cardiovascular exam   '22 TTE - mild concentric left ventricular hypertrophy. There is mild global hypokinesis of the left ventricle. EF 45-50%. The ascending aorta diameter measures at 4 cm by leading edge method, 3.7cm inner edge to inner edge.     Neuro/Psych CVA, Residual Symptoms  negative psych ROS   GI/Hepatic Neg liver ROS,GERD  Medicated and Controlled,,  Endo/Other  diabetes, Type 2, Insulin Dependent    Renal/GU negative Renal ROS     Musculoskeletal  (+) Arthritis ,    Abdominal   Peds  Hematology  On plavix    Anesthesia Other Findings   Reproductive/Obstetrics                             Anesthesia Physical Anesthesia Plan  ASA: 3  Anesthesia Plan: MAC   Post-op Pain Management: Minimal or no pain anticipated   Induction:   PONV Risk Score and Plan: 1 and Propofol infusion and Treatment may vary due to age or medical condition  Airway Management Planned: Nasal Cannula and Natural Airway  Additional Equipment: None  Intra-op Plan:   Post-operative Plan:   Informed Consent: I have reviewed the patients History and Physical, chart, labs and discussed the procedure including the risks, benefits and alternatives for the proposed anesthesia with the patient  or authorized representative who has indicated his/her understanding and acceptance.       Plan Discussed with: CRNA and Anesthesiologist  Anesthesia Plan Comments:         Anesthesia Quick Evaluation

## 2023-04-02 LAB — SURGICAL PATHOLOGY

## 2023-04-03 ENCOUNTER — Encounter: Payer: Self-pay | Admitting: Gastroenterology

## 2023-04-04 ENCOUNTER — Encounter (HOSPITAL_COMMUNITY): Payer: Self-pay | Admitting: Gastroenterology

## 2023-04-06 ENCOUNTER — Inpatient Hospital Stay: Payer: Medicare HMO | Attending: Hematology | Admitting: Hematology

## 2023-04-06 DIAGNOSIS — D472 Monoclonal gammopathy: Secondary | ICD-10-CM | POA: Diagnosis present

## 2023-04-06 DIAGNOSIS — D509 Iron deficiency anemia, unspecified: Secondary | ICD-10-CM | POA: Insufficient documentation

## 2023-04-06 DIAGNOSIS — Z8639 Personal history of other endocrine, nutritional and metabolic disease: Secondary | ICD-10-CM | POA: Insufficient documentation

## 2023-04-06 LAB — VITAMIN D 25 HYDROXY (VIT D DEFICIENCY, FRACTURES): Vit D, 25-Hydroxy: 33.31 ng/mL (ref 30–100)

## 2023-04-06 LAB — CBC WITH DIFFERENTIAL/PLATELET
Abs Immature Granulocytes: 0.03 10*3/uL (ref 0.00–0.07)
Basophils Absolute: 0.1 10*3/uL (ref 0.0–0.1)
Basophils Relative: 1 %
Eosinophils Absolute: 0.2 10*3/uL (ref 0.0–0.5)
Eosinophils Relative: 4 %
HCT: 39.6 % (ref 39.0–52.0)
Hemoglobin: 12.1 g/dL — ABNORMAL LOW (ref 13.0–17.0)
Immature Granulocytes: 1 %
Lymphocytes Relative: 23 %
Lymphs Abs: 1.4 10*3/uL (ref 0.7–4.0)
MCH: 25.7 pg — ABNORMAL LOW (ref 26.0–34.0)
MCHC: 30.6 g/dL (ref 30.0–36.0)
MCV: 84.3 fL (ref 80.0–100.0)
Monocytes Absolute: 0.4 10*3/uL (ref 0.1–1.0)
Monocytes Relative: 6 %
Neutro Abs: 4 10*3/uL (ref 1.7–7.7)
Neutrophils Relative %: 65 %
Platelets: 326 10*3/uL (ref 150–400)
RBC: 4.7 MIL/uL (ref 4.22–5.81)
RDW: 16.3 % — ABNORMAL HIGH (ref 11.5–15.5)
WBC: 6.1 10*3/uL (ref 4.0–10.5)
nRBC: 0 % (ref 0.0–0.2)

## 2023-04-06 LAB — COMPREHENSIVE METABOLIC PANEL
ALT: 17 U/L (ref 0–44)
AST: 16 U/L (ref 15–41)
Albumin: 3.3 g/dL — ABNORMAL LOW (ref 3.5–5.0)
Alkaline Phosphatase: 125 U/L (ref 38–126)
Anion gap: 7 (ref 5–15)
BUN: 14 mg/dL (ref 8–23)
CO2: 27 mmol/L (ref 22–32)
Calcium: 10.2 mg/dL (ref 8.9–10.3)
Chloride: 100 mmol/L (ref 98–111)
Creatinine, Ser: 1.5 mg/dL — ABNORMAL HIGH (ref 0.61–1.24)
GFR, Estimated: 51 mL/min — ABNORMAL LOW (ref >60)
Glucose, Bld: 211 mg/dL — ABNORMAL HIGH (ref 70–99)
Potassium: 4.1 mmol/L (ref 3.5–5.1)
Sodium: 134 mmol/L — ABNORMAL LOW (ref 135–145)
Total Bilirubin: 0.1 mg/dL — ABNORMAL LOW (ref 0.3–1.2)
Total Protein: 7.7 g/dL (ref 6.5–8.1)

## 2023-04-06 LAB — FERRITIN: Ferritin: 82 ng/mL (ref 24–336)

## 2023-04-06 LAB — IRON AND TIBC
Iron: 41 ug/dL — ABNORMAL LOW (ref 45–182)
Saturation Ratios: 13 % — ABNORMAL LOW (ref 17.9–39.5)
TIBC: 323 ug/dL (ref 250–450)
UIBC: 282 ug/dL

## 2023-04-07 ENCOUNTER — Encounter: Payer: Self-pay | Admitting: Hematology

## 2023-04-07 LAB — KAPPA/LAMBDA LIGHT CHAINS
Kappa free light chain: 22.6 mg/L — ABNORMAL HIGH (ref 3.3–19.4)
Kappa, lambda light chain ratio: 0.97 (ref 0.26–1.65)
Lambda free light chains: 23.3 mg/L (ref 5.7–26.3)

## 2023-04-07 NOTE — Progress Notes (Signed)
Opened in error

## 2023-04-11 LAB — PROTEIN ELECTROPHORESIS, SERUM
A/G Ratio: 0.8 (ref 0.7–1.7)
Albumin ELP: 3.3 g/dL (ref 2.9–4.4)
Alpha-1-Globulin: 0.3 g/dL (ref 0.0–0.4)
Alpha-2-Globulin: 1.2 g/dL — ABNORMAL HIGH (ref 0.4–1.0)
Beta Globulin: 1 g/dL (ref 0.7–1.3)
Gamma Globulin: 1.8 g/dL (ref 0.4–1.8)
Globulin, Total: 4.3 g/dL — ABNORMAL HIGH (ref 2.2–3.9)
M-Spike, %: 1.6 g/dL — ABNORMAL HIGH
Total Protein ELP: 7.6 g/dL (ref 6.0–8.5)

## 2023-04-12 NOTE — Progress Notes (Signed)
[facility] [address], [place] [address]    Clinic Day:  04/12/2023  Referring physician: Iona Hansen, NP  Patient Care Team: Iona Hansen, NP as PCP - General (Nurse Practitioner) Dorena Cookey, MD (Inactive) as Consulting Physician (Gastroenterology) Doreatha Massed, MD as Medical Oncologist (Hematology)   ASSESSMENT & PLAN:   Assessment: 1.  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 - Colonoscopy (08/26/2017) normal. - Small bowel endoscopy (05/24/2018): 1 gastric polyp, 1 nonbleeding angiectasia in duodenum treated with APC.  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  Normal duodenum, first second and third parts. - He was receiving intermittent Venofer infusions, last 1 on 09/08/2021. - He was also receiving B12 injections, last 1 on 05/25/2022. - BMBX (10/06/2022): Hypercellular marrow (60%) involved by plasma cell neoplasm, 9% plasma cells by manual aspirate differential and 10% by CD138 IHC.  No evidence of morphologic dysplasia.  No ring sideroblasts. - Myeloma FISH panel: t(11;14) consistent with standard risk.  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  Social/family history: - Lives with wife at home.  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  Quit smoking 5 months ago.  Smoked 1/3 pack/day for 50 years. - Multiple maternal aunts and uncles had cancers.  Patient does not know the types.    Plan: 1.  IgG lambda smoldering multiple myeloma: - He does not report any new onset bone pains.  No recent infections. - Reviewed myeloma labs from 12/25/2022: M spike is 1.2 g.  Immunofixation shows IgG lambda.  FLC ratio is pending.  Hemoglobin-11.7, creatinine-1.51, calcium 10.3.  Albumin 3.3. - He reports taking vitamin D twice per week.  I have recommended that he stop taking vitamin D. - No "crab" features at this time.  Will repeat labs in 3  months.   2.  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 - Last Duncan Dull was in May.  He complains of feeling very tired. - Reviewed labs from 12/25/2022.  Hemoglobin is 11.7 and ferritin is 591 with percent saturation of 6. - As he is complaining of severe fatigue, recommend Feraheme x 1.  Will plan to repeat labs in 3 months.    No orders of the defined types were placed in this encounter.    James Zuniga,acting as a Neurosurgeon for Doreatha Massed, MD.,have documented all relevant documentation on the behalf of Doreatha Massed, MD,as directed by  Doreatha Massed, MD while in the presence of Doreatha Massed, MD.  ***   James Zuniga   9/30/20249:18 PM  CHIEF COMPLAINT:   Diagnosis: Smoldering myeloma; Normocytic anemia    Cancer Staging  No matching staging information was found for the patient.    Prior Therapy: Venofer and B12 injections   Current Therapy:  Feraheme    HISTORY OF PRESENT ILLNESS:   Oncology History  Chronic iron deficiency anemia     INTERVAL HISTORY:   James Zuniga is a 67 y.o. male presenting to clinic today for follow up of Smoldering myeloma; Normocytic anemia. He was last seen by me on 01/11/23.  Since his last visit, he underwent an EGD on 04/01/23 with Dr. Meridee Score. Surgical pathology of the duodenal polypectomy revealed: polypoid fragment of benign small bowel mucosa with reactive changes. The duodenal polyp biopsy site revealed: duodenal mucosa with focal atypia, indefinite for dysplasia. The super  papillary polypectomy revealed: benign small bowel mucosa with foveolar metaplasia suggestive of peptic injury. The duodenal apex polypectomy revealed: peptic duodenitis. The stomach biopsy revealed: gastric antral and oxyntic mucosa with no specific pathologic changes, negative for H. Pylori, and negative for intestinal metaplasia or malignancy   Today, he states that he is doing well overall. His appetite level  is at ***%. His energy level is at ***%.  PAST MEDICAL HISTORY:   Past Medical History: Past Medical History:  Diagnosis Date   Arthritis    "all over" (08/24/2017)   CHF (congestive heart failure) (HCC)    Chronic airway obstruction (HCC)    Chronic lower back pain    Coronary artery disease    Disorder of liver    "spots on my liver; they've rechecked them; they haven't changed" (08/24/2017)   Dyspnea    Esophageal reflux    Gout    "on daily RX" (08/24/2017)   Hyperlipidemia    Hypertension    Hypokalemia 11/2016   Hypotestosteronism 07/01/2013   Insomnia    Iron deficiency anemia, unspecified 07/01/2013   Memory loss    On home oxygen therapy    "2L prn" (08/24/2017)   OSA on CPAP    PVD (peripheral vascular disease) (HCC)    Renal disorder    Stroke (HCC) 01/2017   "left sided weakness remains" (08/24/2017)   Type II diabetes mellitus (HCC)    Vitamin D deficiency     Surgical History: Past Surgical History:  Procedure Laterality Date   BIOPSY  11/06/2022   Procedure: BIOPSY;  Surgeon: Corbin Ade, MD;  [facility]: [facility];  Service: Endoscopy;;   BIOPSY  04/01/2023   Procedure: BIOPSY;  Surgeon: Lemar Lofty., MD;  Location: [facility];  Service: Gastroenterology;;   COLONOSCOPY N/A 08/26/2017   Procedure: COLONOSCOPY;  Surgeon: Iva Boop, MD;  Location: [facility] END[facility]COPY;  Service: Endoscopy;  Laterality: N/A;   COLONOSCOPY WITH PROPOFOL N/A 11/06/2022   Procedure: COLONOSCOPY WITH PROPOFOL;  Surgeon: Corbin Ade, MD;  Location: [facility];  Service: Endoscopy;  Laterality: N/A;  1:15 PM   ENTEROSCOPY N/A 05/24/2018   Procedure: ENTEROSCOPY;  Surgeon: Shellia Cleverly, DO;  Location: [facility]COPY;  Service: Gastroenterology;  Laterality: N/A;   ESOPHAGOGASTRODUODENOSCOPY N/A 08/26/2017   Procedure: ESOPHAGOGASTRODUODENOSCOPY (EGD);  Surgeon: Iva Boop, MD;  Location[facility] END[facility]COPY;  Service: Endoscopy;  Laterality: N/A;    ESOPHAGOGASTRODUODENOSCOPY (EGD) WITH PROPOFOL N/A 11/06/2022   Procedure: ESOPHAGOGASTRODUODENOSCOPY (EGD) WITH PROPOFOL;  Surgeon: Corbin Ade, MD;  [facility]: [facility];  Service: Endoscopy;  Laterality: N/A;   ESOPHAGOGASTRODUODENOSCOPY (EGD) WITH PROPOFOL N/A 04/01/2023   Procedure: ESOPHAGOGASTRODUODENOSCOPY (EGD) WITH PROPOFOL;  Surgeon: Meridee Score Netty Starring., MD;  Location: [facility];  Service: Gastroenterology;  Laterality: N/A;   EUS N/A 04/01/2023   Procedure: UPPER ENDOSCOPIC ULTRASOUND (EUS) RADIAL;  Surgeon: Lemar Lofty., MD;  Location: [facility];  Service: Gastroenterology;  Laterality: N/A;   HEMOSTASIS CLIP PLACEMENT  04/01/2023   Procedure: HEMOSTASIS CLIP PLACEMENT;  Surgeon: Lemar Lofty., MD;  Location: [facility];  Service: Gastroenterology;;   HOT HEMOSTASIS N/A 05/24/2018   Procedure: HOT HEMOSTASIS (ARGON PLASMA COAGULATION/BICAP);  Surgeon: Shellia Cleverly, DO;  Location: [facility];  Service: Gastroenterology;  Laterality: N/A;   KNEE ARTHROSCOPY Left    LOOP RECORDER IMPLANT  06/2017   POLYPECTOMY  05/24/2018   Procedure: POLYPECTOMY;  Surgeon: Shellia Cleverly, DO;  Location: [facility];  Service: Gastroenterology;;  POLYPECTOMY  11/06/2022   Procedure: POLYPECTOMY;  Surgeon: Corbin Ade, MD;  [facility]: [facility];  Service: Endoscopy;;   POLYPECTOMY  04/01/2023   Procedure: POLYPECTOMY;  Surgeon: Meridee Score Netty Starring., MD;  Location: [facility];  Service: Gastroenterology;;   TEE WITHOUT CARDIOVERSION N/A 12/24/2016   Procedure: TRANSESOPHAGEAL ECHOCARDIOGRAM (TEE);  Surgeon: Pricilla Riffle, MD;  Location: [facility] ENDOSCOPY;  Service: Cardiovascular;  Laterality: N/A;    Social History: Social History   Socioeconomic History   Marital status: Married    Spouse name: Not on file   Number of children: Not on file   Years of education: Not on file   Highest education level: Not on file  Occupational History    Occupation: diabled    Comment: as of early 1990s  Tobacco Use   Smoking status: Former    Current packs/day: 0.00    Average packs/day: 0.5 packs/day for 40.0 years (20.0 ttl pk-yrs)    Types: Cigarettes    Start date: 08/05/1983    Quit date: 08/15/2017    Years since quitting: 5.6   Smokeless tobacco: Never  Vaping Use   Vaping status: Never Used  Substance and Sexual Activity   Alcohol use: No    Alcohol/week: 0.0 standard drinks of alcohol   Drug use: No   Sexual activity: Not Currently  Other Topics Concern   Not on file  Social History Narrative   Not on file   Social Determinants of Health   Financial Resource Strain: Not on file  Food Insecurity: Low Risk  (03/02/2023)   Received from [facility]   Hunger Vital Sign    Worried About Running Out of Food in the Last Year: Never true    Ran Out of Food in the Last Year: Never true  Transportation Needs: No Transportation Needs (07/17/2022)   PRAPARE - Administrator, Civil Service (Medical): No    Lack of Transportation (Non-Medical): No  Physical Activity: Not on file  Stress: Not on file  Social Connections: Not on file  Intimate Partner Violence: Not At Risk (07/17/2022)   Humiliation, Afraid, Rape, and Kick questionnaire    Fear of Current or Ex-Partner: No    Emotionally Abused: No    Physically Abused: No    Sexually Abused: No    Family History: Family History  Problem Relation Age of Onset   Heart disease Mother    Diabetes Father    Stroke Brother    Stroke Paternal Uncle    Diabetes Sister    Heart attack Brother    Heart attack Brother     Current Medications:  Current Outpatient Medications:    albuterol (VENTOLIN HFA) 108 (90 Base) MCG/ACT inhaler, Inhale 2 puffs into the lungs every 6 (six) hours as needed for wheezing or shortness of breath., Disp: 18 g, Rfl: 3   allopurinol (ZYLOPRIM) 300 MG tablet, Take 300 mg by mouth daily., Disp: , Rfl:    aspirin EC 81 MG tablet, Take 81  mg by mouth daily. Swallow whole., Disp: , Rfl:    atorvastatin (LIPITOR) 80 MG tablet, Take 1 tablet (80 mg total) by mouth every morning., Disp: 30 tablet, Rfl: 0   Blood Glucose Monitoring Suppl (FIFTY50 GLUCOSE METER 2.0) w/Device KIT, To check blood sugars TID or Use as instructed Include strips. Lancets, lancet device, control solution. batteries, Disp: , Rfl:    clopidogrel (PLAVIX) 75 MG tablet, Take 1 tablet (75 mg total) by mouth daily.,  Disp: , Rfl:    colchicine 0.6 MG tablet, Take 0.6 mg by mouth daily., Disp: , Rfl:    Cyanocobalamin (VITAMIN B-12 IJ), Inject 1,000 mg as directed See admin instructions. Every 30 days as needed, Disp: , Rfl:    diclofenac Sodium (VOLTAREN) 1 % GEL, Apply 2 g topically daily as needed (pain)., Disp: , Rfl:    DULoxetine (CYMBALTA) 60 MG capsule, Take 60 mg by mouth 2 (two) times daily. , Disp: , Rfl:    fluticasone (FLONASE) 50 MCG/ACT nasal spray, Place 1 spray into both nostrils daily., Disp: 16 g, Rfl: 2   fluticasone furoate-vilanterol (BREO ELLIPTA) 200-25 MCG/ACT AEPB, Inhale 1 puff into the lungs daily., Disp: 1 each, Rfl: 5   furosemide (LASIX) 20 MG tablet, Take 20 mg by mouth every Tuesday, Thursday, and Saturday at 6 PM. , Disp: , Rfl:    gabapentin (NEURONTIN) 300 MG capsule, Take 600 mg by mouth 3 (three) times daily., Disp: , Rfl:    glucose blood test strip, 1 each by Misc.(Non-Drug; Combo Route) route daily., Disp: , Rfl:    Insulin Glargine (TOUJEO SOLOSTAR Cleona), Inject 70 Units into the skin daily with supper., Disp: , Rfl:    insulin NPH-regular Human (NOVOLIN 70/30) (70-30) 100 UNIT/ML injection, Please inject 25 units twice daily before breakfast and dinner. (Patient taking differently: Inject 25 Units into the skin 2 (two) times daily with a meal. Sliding scale), Disp: 10 mL, Rfl: 3   Insulin Pen Needle (FIFTY50 PEN NEEDLES) 32G X 4 MM MISC, 1 each by Misc.(Non-Drug; Combo Route) route daily., Disp: , Rfl:    ipratropium-albuterol  (DUONEB) 0.5-2.5 (3) MG/3ML SOLN, USE 1 VIAL IN NEBULIZER 4 TIMES DAILY (Patient taking differently: Take 3 mLs by nebulization 4 (four) times daily.), Disp: 360 mL, Rfl: 5   Lancets (ONETOUCH ULTRASOFT) lancets, USE 1 LANCET TO CHECK GLUCOSE IN THE MORNING BEFORE BREAKFAST, Disp: , Rfl: 11   losartan (COZAAR) 100 MG tablet, Take 100 mg by mouth daily. (Patient not taking: Reported on 01/11/2023), Disp: , Rfl:    meclizine (ANTIVERT) 12.5 MG tablet, Take 1 tablet (12.5 mg total) by mouth 3 (three) times daily as needed for dizziness., Disp: 20 tablet, Rfl: 0   methocarbamol (ROBAXIN) 500 MG tablet, Take 500 mg by mouth 3 (three) times daily as needed for muscle spasms., Disp: , Rfl:    metoprolol succinate (TOPROL-XL) 100 MG 24 hr tablet, Take 100 mg by mouth daily., Disp: , Rfl:    pantoprazole (PROTONIX) 20 MG tablet, Take 1 tablet (20 mg total) by mouth 2 (two) times daily before a meal., Disp: 60 tablet, Rfl: 6   potassium chloride SA (K-DUR,KLOR-CON) 20 MEQ tablet, Take 1 tablet (20 mEq total) by mouth 2 (two) times daily. (Patient taking differently: Take 20 mEq by mouth 3 (three) times daily. Take 1 tablet (20 meq) scheduled three times daily & takes an additional tablet at lunch on Tuesdays, Thursdays, & Saturdays due to lasix), Disp: 30 tablet, Rfl: 3   primidone (MYSOLINE) 50 MG tablet, Take 100 mg by mouth at bedtime. , Disp: , Rfl:    spironolactone (ALDACTONE) 25 MG tablet, Take 25 mg by mouth every Monday, Wednesday, and Friday., Disp: , Rfl:    tamsulosin (FLOMAX) 0.4 MG CAPS capsule, Take 1 capsule (0.4 mg total) by mouth daily after breakfast. (Patient taking differently: Take 0.4 mg by mouth at bedtime.), Disp: 30 capsule, Rfl: 12   tiZANidine (ZANAFLEX) 4 MG tablet, Take 4  mg by mouth 3 (three) times daily as needed for muscle spasms. , Disp: , Rfl: 0   traMADol (ULTRAM) 50 MG tablet, Take 50 mg by mouth daily as needed for moderate pain. , Disp: , Rfl:    zolpidem (AMBIEN CR) 12.5 MG  CR tablet, Take 12.5 mg by mouth at bedtime., Disp: , Rfl:    Allergies: No Known Allergies  REVIEW OF SYSTEMS:   Review of Systems  Constitutional:  Negative for chills, fatigue and fever.  HENT:   Negative for lump/mass, mouth sores, nosebleeds, sore throat and trouble swallowing.   Eyes:  Negative for eye problems.  Respiratory:  Negative for cough and shortness of breath.   Cardiovascular:  Negative for chest pain, leg swelling and palpitations.  Gastrointestinal:  Negative for abdominal pain, constipation, diarrhea, nausea and vomiting.  Genitourinary:  Negative for bladder incontinence, difficulty urinating, dysuria, frequency, hematuria and nocturia.   Musculoskeletal:  Negative for arthralgias, back pain, flank pain, myalgias and neck pain.  Skin:  Negative for itching and rash.  Neurological:  Negative for dizziness, headaches and numbness.  Hematological:  Does not bruise/bleed easily.  Psychiatric/Behavioral:  Negative for depression, sleep disturbance and suicidal ideas. The patient is not nervous/anxious.   All other systems reviewed and are negative.    VITALS:   There were no vitals taken for this visit.  Wt Readings from Last 3 Encounters:  04/01/23 232 lb 5.8 oz (105.4 kg)  02/23/23 232 lb 6.4 oz (105.4 kg)  01/11/23 228 lb 11.2 oz (103.7 kg)    There is no height or weight on file to calculate BMI.  Performance status (ECOG): 1 - Symptomatic but completely ambulatory  PHYSICAL EXAM:   Physical Exam Vitals and nursing note reviewed. Exam conducted with a chaperone present.  Constitutional:      Appearance: Normal appearance.  Cardiovascular:     Rate and Rhythm: Normal rate and regular rhythm.     Pulses: Normal pulses.     Heart sounds: Normal heart sounds.  Pulmonary:     Effort: Pulmonary effort is normal.     Breath sounds: Normal breath sounds.  Abdominal:     Palpations: Abdomen is soft. There is no hepatomegaly, splenomegaly or mass.      Tenderness: There is no abdominal tenderness.  Musculoskeletal:     Right lower leg: No edema.     Left lower leg: No edema.  Lymphadenopathy:     Cervical: No cervical adenopathy.     Right cervical: No superficial, deep or posterior cervical adenopathy.    Left cervical: No superficial, deep or posterior cervical adenopathy.     Upper Body:     Right upper body: No supraclavicular or axillary adenopathy.     Left upper body: No supraclavicular or axillary adenopathy.  Neurological:     General: No focal deficit present.     Mental Status: He is alert and oriented to person, place, and time.  Psychiatric:        Mood and Affect: Mood normal.        Behavior: Behavior normal.     LABS:      Latest Ref Rng & Units 04/06/2023    8:58 AM 12/25/2022    1:00 PM 10/06/2022    7:16 AM  CBC  WBC 4.0 - 10.5 K/uL 6.1  9.7  6.2   Hemoglobin 13.0 - 17.0 g/dL 16.1  09.6  9.6   Hematocrit 39.0 - 52.0 % 39.6  38.6  33.8   Platelets 150 - 400 K/uL 326  266  265       Latest Ref Rng & Units 04/06/2023    8:58 AM 12/25/2022    1:00 PM 11/03/2022    3:51 PM  CMP  Glucose 70 - 99 mg/dL 657  846  962   BUN 8 - 23 mg/dL 14  16  9    Creatinine 0.61 - 1.24 mg/dL 9.52  8.41  3.24   Sodium 135 - 145 mmol/L 134  134  135   Potassium 3.5 - 5.1 mmol/L 4.1  3.3  3.6   Chloride 98 - 111 mmol/L 100  99  101   CO2 22 - 32 mmol/L 27  24  25    Calcium 8.9 - 10.3 mg/dL 40.1  02.7  25.3   Total Protein 6.5 - 8.1 g/dL 7.7  8.4    Total Bilirubin 0.3 - 1.2 mg/dL 0.1  0.7    Alkaline Phos 38 - 126 U/L 125  95    AST 15 - 41 U/L 16  20    ALT 0 - 44 U/L 17  15       Lab Results  Component Value Date   CEA 2.8 06/30/2013   /  CEA  Date Value Ref Range Status  06/30/2013 2.8 0.0 - 5.0 ng/mL Final   No results found for: "PSA1" No results found for: "CAN199" No results found for: "CAN125"  Lab Results  Component Value Date   TOTALPROTELP 7.6 04/06/2023   ALBUMINELP 3.3 04/06/2023   A1GS 0.3  04/06/2023   A2GS 1.2 (H) 04/06/2023   BETS 1.0 04/06/2023   GAMS 1.8 04/06/2023   MSPIKE 1.6 (H) 04/06/2023   SPEI Comment 04/06/2023   Lab Results  Component Value Date   TIBC 323 04/06/2023   TIBC 247 (L) 12/25/2022   TIBC 287 10/05/2022   FERRITIN 82 04/06/2023   FERRITIN 591 (H) 12/25/2022   FERRITIN 392 (H) 10/05/2022   IRONPCTSAT 13 (L) 04/06/2023   IRONPCTSAT 6 (L) 12/25/2022   IRONPCTSAT 25 10/05/2022   Lab Results  Component Value Date   LDH 89 (L) 12/25/2022   LDH 79 (L) 09/14/2022   LDH 85 (L) 07/17/2022     STUDIES:   No results found.

## 2023-04-13 ENCOUNTER — Inpatient Hospital Stay: Payer: Medicare HMO | Attending: Hematology | Admitting: Hematology

## 2023-04-13 VITALS — BP 112/79 | HR 84 | Temp 98.1°F | Resp 18 | Wt 228.5 lb

## 2023-04-13 DIAGNOSIS — N189 Chronic kidney disease, unspecified: Secondary | ICD-10-CM | POA: Insufficient documentation

## 2023-04-13 DIAGNOSIS — D472 Monoclonal gammopathy: Secondary | ICD-10-CM | POA: Diagnosis not present

## 2023-04-13 DIAGNOSIS — D500 Iron deficiency anemia secondary to blood loss (chronic): Secondary | ICD-10-CM

## 2023-04-13 DIAGNOSIS — D631 Anemia in chronic kidney disease: Secondary | ICD-10-CM | POA: Diagnosis not present

## 2023-04-13 DIAGNOSIS — Z87891 Personal history of nicotine dependence: Secondary | ICD-10-CM | POA: Insufficient documentation

## 2023-04-13 MED ORDER — VARENICLINE TARTRATE (STARTER) 0.5 MG X 11 & 1 MG X 42 PO TBPK: ORAL_TABLET | ORAL | 0 refills | Status: DC

## 2023-04-13 NOTE — Patient Instructions (Addendum)
[facility] at [facility] Discharge Instructions   You were seen and examined today by Dr. Ellin Saba.  He reviewed the results of your lab work which are mostly normal/stable. The abnormal protein in you blood is stable. Your kidney function, hemoglobin, and platelets are also stable. Your iron level was a little low at 82 (down from over 500). We will arrange for you to have 2 iron infusions to correct this.   We will see you back in 4 months. We will repeat lab work prior to this visit.   Return as scheduled.      Thank you for choosing [facility] at [facility] to provide your oncology and hematology care.  To afford each patient quality time with our provider, please arrive at least 15 minutes before your scheduled appointment time.   If you have a lab appointment with the Cancer Center please come in thru the Main Entrance and check in at the main information desk.  You need to re-schedule your appointment should you arrive 10 or more minutes late.  We strive to give you quality time with our providers, and arriving late affects you and other patients whose appointments are after yours.  Also, if you no show three or more times for appointments you may be dismissed from the clinic at the providers discretion.     Again, thank you for choosing [facility].  Our hope is that these requests will decrease the amount of time that you wait before being seen by our physicians.       _____________________________________________________________  Should you have questions after your visit to [facility], please contact our office at [phone] and follow the prompts.  Our office hours are 8:00 a.m. and 4:30 p.m. Monday - Friday.  Please note that voicemails left after 4:00 p.m. may not be returned until the following business day.  We are closed weekends and major holidays.  You do have access to a nurse 24-7, just call  the main number to the clinic [phone] and do not press any options, hold on the line and a nurse will answer the phone.    For prescription refill requests, have your pharmacy contact our office and allow 72 hours.    Due to Covid, you will need to wear a mask upon entering the hospital. If you do not have a mask, a mask will be given to you at the Main Entrance upon arrival. For doctor visits, patients may have 1 support person age 29 or older with them. For treatment visits, patients can not have anyone with them due to social distancing guidelines and our immunocompromised population.

## 2023-04-20 ENCOUNTER — Inpatient Hospital Stay: Payer: Medicare HMO

## 2023-04-20 ENCOUNTER — Telehealth: Payer: Self-pay

## 2023-04-20 VITALS — BP 98/67 | HR 77 | Temp 97.6°F | Resp 18

## 2023-04-20 DIAGNOSIS — D509 Iron deficiency anemia, unspecified: Secondary | ICD-10-CM

## 2023-04-20 DIAGNOSIS — D500 Iron deficiency anemia secondary to blood loss (chronic): Secondary | ICD-10-CM | POA: Diagnosis not present

## 2023-04-20 MED ORDER — SODIUM CHLORIDE 0.9 % IV SOLN
510.0000 mg | Freq: Once | INTRAVENOUS | Status: AC
  Administered 2023-04-20: 510 mg via INTRAVENOUS
  Filled 2023-04-20: qty 510

## 2023-04-20 MED ORDER — CETIRIZINE HCL 10 MG PO TABS
10.0000 mg | ORAL_TABLET | Freq: Once | ORAL | Status: AC
  Administered 2023-04-20: 10 mg via ORAL
  Filled 2023-04-20: qty 1

## 2023-04-20 MED ORDER — SODIUM CHLORIDE 0.9 % IV SOLN: Freq: Once | INTRAVENOUS | Status: AC

## 2023-04-20 MED ORDER — ACETAMINOPHEN 325 MG PO TABS
650.0000 mg | ORAL_TABLET | Freq: Once | ORAL | Status: AC
  Administered 2023-04-20: 650 mg via ORAL
  Filled 2023-04-20: qty 2

## 2023-04-20 NOTE — Telephone Encounter (Signed)
Baxter Hire, did you see this? It was a staff message from Dr. Jena Gauss.

## 2023-04-20 NOTE — Telephone Encounter (Signed)
-----   Message from Eula Listen sent at 04/17/2023  9:53 AM EDT ----- Regarding: FW: Referral Have the recommended follow-ups been scheduled? ----- Message ----- From: Lemar Lofty., MD Sent: 04/03/2023   4:28 AM EDT To: Corbin Ade, MD; Letta Median, PA-C Subject: Referral                                       [facility] and [facility], You should be receiving a results note. The EUS report is visible. I think you all should reach out to radiology and have them do a redo/a reread of the CT scan. 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 I do not see an overt mass that needs to be sampled, but given the major papilla, there was some thickening within the submucosa and so this area should be monitored. I believe he has a loop recorder that will not allow him to get an MRI/MRCP, but it is clear to me if this is a permanent or if this is just temporary. I recommend a MRI/MRCP in 1 year versus a CT abdomen pancreas protocol. As above, have radiology do a reread or reinterpretation of the current CT scan. If he develops pancreatitis or develops obstructive jaundice findings/biochemical testing, earlier repeat evaluation. I think a 1 year follow-up endoscopy to follow-up the previous duodenal event did show evidence of indefinite for dysplasia on this finding is reasonable for you to perform. All the best. GM

## 2023-04-20 NOTE — Telephone Encounter (Signed)
Yes. I have had radiology take a second look at CT. 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 An addendum has been filed.   Darl Pikes:  Please place patient on recall for CT pancreatic protocol vs MRI pancreatic protocol in 1 year. Decision about which imaging study will need to be made at the time of recall as I am not clear if loop recorder will still be in place at that time.  We also need to place him on recall for repeat EGD in 1 year.

## 2023-04-20 NOTE — Progress Notes (Signed)
Patient presents today for iron infusion.  Patient is in satisfactory condition with no new complaints voiced.  Vital signs are stable.  IV placed in R arm.  IV flushed well with good blood return noted.  We will proceed with infusion per provider orders.   Patient tolerated infusion well with no complaints voiced. Patient waited approximately 20 minutes of the recommended 30 minute post iron wait time. Patient left ambulatory with cane in stable condition.  Vital signs stable at discharge.  Follow up as scheduled.

## 2023-04-20 NOTE — Patient Instructions (Signed)
 [facility]  Discharge Instructions: Thank you for choosing [facility] to provide your oncology and hematology care.  If you have a lab appointment with the Cancer Center - please note that after April 8th, 2024, all labs will be drawn in the cancer center.  You do not have to check in or register with the main entrance as you have in the past but will complete your check-in in the cancer center.  Wear comfortable clothing and clothing appropriate for easy access to any Portacath or PICC line.   We strive to give you quality time with your provider. You may need to reschedule your appointment if you arrive late (15 or more minutes).  Arriving late affects you and other patients whose appointments are after yours.  Also, if you miss three or more appointments without notifying the office, you may be dismissed from the clinic at the provider's discretion.      For prescription refill requests, have your pharmacy contact our office and allow 72 hours for refills to be completed.    Today you received the following:  Feraheme.  Ferumoxytol Injection What is this medication? FERUMOXYTOL (FER ue MOX i tol) treats low levels of iron in your body (iron deficiency anemia). Iron is a mineral that plays an important role in making red blood cells, which carry oxygen from your lungs to the rest of your body. This medicine may be used for other purposes; ask your health care provider or pharmacist if you have questions. COMMON BRAND NAME(S): Feraheme What should I tell my care team before I take this medication? They need to know if you have any of these conditions: Anemia not caused by low iron levels High levels of iron in the blood Magnetic resonance imaging (MRI) test scheduled An unusual or allergic reaction to iron, other medications, foods, dyes, or preservatives Pregnant or trying to get pregnant Breastfeeding How should I use this medication? This medication  is injected into a vein. It is given by your care team in a hospital or clinic setting. Talk to your care team the use of this medication in children. Special care may be needed. Overdosage: If you think you have taken too much of this medicine contact a poison control center or emergency room at once. NOTE: This medicine is only for you. Do not share this medicine with others. What if I miss a dose? It is important not to miss your dose. Call your care team if you are unable to keep an appointment. What may interact with this medication? Other iron products This list may not describe all possible interactions. Give your health care provider a list of all the medicines, herbs, non-prescription drugs, or dietary supplements you use. Also tell them if you smoke, drink alcohol, or use illegal drugs. Some items may interact with your medicine. What should I watch for while using this medication? Visit your care team regularly. Tell your care team if your symptoms do not start to get better or if they get worse. You may need blood work done while you are taking this medication. You may need to follow a special diet. Talk to your care team. Foods that contain iron include: whole grains/cereals, dried fruits, beans, or peas, leafy green vegetables, and organ meats (liver, kidney). What side effects may I notice from receiving this medication? Side effects that you should report to your care team as soon as possible: Allergic reactions--skin rash, itching, hives, swelling of the  face, lips, tongue, or throat Low blood pressure--dizziness, feeling faint or lightheaded, blurry vision Shortness of breath Side effects that usually do not require medical attention (report to your care team if they continue or are bothersome): Flushing Headache Joint pain Muscle pain Nausea Pain, redness, or irritation at injection site This list may not describe all possible side effects. Call your doctor for medical  advice about side effects. You may report side effects to FDA at 1-800-FDA-1088. Where should I keep my medication? This medication is given in a hospital or clinic. It will not be stored at home. NOTE: This sheet is a summary. It may not cover all possible information. If you have questions about this medicine, talk to your doctor, pharmacist, or health care provider.  2024 Elsevier/Gold Standard (2022-12-04 00:00:00)     To help prevent nausea and vomiting after your treatment, we encourage you to take your nausea medication as directed.  BELOW ARE SYMPTOMS THAT SHOULD BE REPORTED IMMEDIATELY: *FEVER GREATER THAN 100.4 F (38 C) OR HIGHER *CHILLS OR SWEATING *NAUSEA AND VOMITING THAT IS NOT CONTROLLED WITH YOUR NAUSEA MEDICATION *UNUSUAL SHORTNESS OF BREATH *UNUSUAL BRUISING OR BLEEDING *URINARY PROBLEMS (pain or burning when urinating, or frequent urination) *BOWEL PROBLEMS (unusual diarrhea, constipation, pain near the anus) TENDERNESS IN MOUTH AND THROAT WITH OR WITHOUT PRESENCE OF ULCERS (sore throat, sores in mouth, or a toothache) UNUSUAL RASH, SWELLING OR PAIN  UNUSUAL VAGINAL DISCHARGE OR ITCHING   Items with * indicate a potential emergency and should be followed up as soon as possible or go to the Emergency Department if any problems should occur.  Please show the CHEMOTHERAPY ALERT CARD or IMMUNOTHERAPY ALERT CARD at check-in to the Emergency Department and triage nurse.  Should you have questions after your visit or need to cancel or reschedule your appointment, please contact [facility] [phone]  and follow the prompts.  Office hours are 8:00 a.m. to 4:30 p.m. Monday - Friday. Please note that voicemails left after 4:00 p.m. may not be returned until the following business day.  We are closed weekends and major holidays. You have access to a nurse at all times for urgent questions. Please call the main number to the clinic [phone] and follow  the prompts.  For any non-urgent questions, you may also contact your provider using MyChart. We now offer e-Visits for anyone 100 and older to request care online for non-urgent symptoms. For details visit [email or web address].   Also download the MyChart app! Go to the app store, search "MyChart", open the app, select [place], and log in with your MyChart username and password.

## 2023-04-21 ENCOUNTER — Encounter: Payer: Self-pay | Admitting: Hematology

## 2023-04-21 ENCOUNTER — Telehealth: Payer: Self-pay | Admitting: Pulmonary Disease

## 2023-04-21 NOTE — Telephone Encounter (Signed)
Last order placed 03/12/23 was sent [facility]. They aren't in network. I called [facility] and spoke with Valentina Gu she stated the patient is not due for new machine until 2026. I explained to Valentina Gu that the patient stated the machine was not  working correctly and she ask if the machine had been trouble shooted.  I told her I really didn't know but the patient stated he had been to the [place] office several times and got fed up and didn't go back. I have now faxed the cpap order to [facility]. I have called the patient letting him know they would reach out to him about trouble shooting his current cpap machine. Per insurance he is not due for new machine until 2026 and may have to pay out of pocket

## 2023-04-21 NOTE — Telephone Encounter (Signed)
PT still does not have his CPAP. Please call to advise. He has waited awhile. Call [phone]

## 2023-04-21 NOTE — Telephone Encounter (Signed)
Lets get patient scheduled for follow-up in about 4 weeks for IDA.

## 2023-04-21 NOTE — Telephone Encounter (Signed)
Noted  

## 2023-04-22 ENCOUNTER — Telehealth: Payer: Self-pay | Admitting: Gastroenterology

## 2023-04-22 NOTE — Telephone Encounter (Signed)
Kristen sent a message to schedule patient in 4 weeks re: IDA, called and left a message asking him to call office to get scheduled.

## 2023-04-26 ENCOUNTER — Encounter: Payer: Self-pay | Admitting: Primary Care

## 2023-04-29 ENCOUNTER — Inpatient Hospital Stay: Payer: Medicare HMO

## 2023-04-29 VITALS — BP 92/67 | HR 74 | Temp 98.0°F | Resp 17

## 2023-04-29 DIAGNOSIS — D509 Iron deficiency anemia, unspecified: Secondary | ICD-10-CM

## 2023-04-29 DIAGNOSIS — D500 Iron deficiency anemia secondary to blood loss (chronic): Secondary | ICD-10-CM | POA: Diagnosis not present

## 2023-04-29 MED ORDER — SODIUM CHLORIDE 0.9 % IV SOLN: Freq: Once | INTRAVENOUS | Status: AC

## 2023-04-29 MED ORDER — CETIRIZINE HCL 10 MG PO TABS
10.0000 mg | ORAL_TABLET | Freq: Once | ORAL | Status: AC
  Administered 2023-04-29: 10 mg via ORAL
  Filled 2023-04-29: qty 1

## 2023-04-29 MED ORDER — SODIUM CHLORIDE 0.9 % IV SOLN
510.0000 mg | Freq: Once | INTRAVENOUS | Status: AC
  Administered 2023-04-29: 510 mg via INTRAVENOUS
  Filled 2023-04-29: qty 510

## 2023-04-29 MED ORDER — ACETAMINOPHEN 325 MG PO TABS
650.0000 mg | ORAL_TABLET | Freq: Once | ORAL | Status: AC
  Administered 2023-04-29: 650 mg via ORAL
  Filled 2023-04-29: qty 2

## 2023-04-29 NOTE — Patient Instructions (Signed)
[facility]  Discharge Instructions: Thank you for choosing [facility] to provide your oncology and hematology care.  If you have a lab appointment with the Cancer Center - please note that after April 8th, 2024, all labs will be drawn in the cancer center.  You do not have to check in or register with the main entrance as you have in the past but will complete your check-in in the cancer center.  Wear comfortable clothing and clothing appropriate for easy access to any Portacath or PICC line.   We strive to give you quality time with your provider. You may need to reschedule your appointment if you arrive late (15 or more minutes).  Arriving late affects you and other patients whose appointments are after yours.  Also, if you miss three or more appointments without notifying the office, you may be dismissed from the clinic at the provider's discretion.      For prescription refill requests, have your pharmacy contact our office and allow 72 hours for refills to be completed.    Today you received the following:  Feraheme.  Ferumoxytol Injection What is this medication? FERUMOXYTOL (FER ue MOX i tol) treats low levels of iron in your body (iron deficiency anemia). Iron is a mineral that plays an important role in making red blood cells, which carry oxygen from your lungs to the rest of your body. This medicine may be used for other purposes; ask your health care provider or pharmacist if you have questions. COMMON BRAND NAME(S): Feraheme What should I tell my care team before I take this medication? They need to know if you have any of these conditions: Anemia not caused by low iron levels High levels of iron in the blood Magnetic resonance imaging (MRI) test scheduled An unusual or allergic reaction to iron, other medications, foods, dyes, or preservatives Pregnant or trying to get pregnant Breastfeeding How should I use this medication? This medication  is injected into a vein. It is given by your care team in a hospital or clinic setting. Talk to your care team the use of this medication in children. Special care may be needed. Overdosage: If you think you have taken too much of this medicine contact a poison control center or emergency room at once. NOTE: This medicine is only for you. Do not share this medicine with others. What if I miss a dose? It is important not to miss your dose. Call your care team if you are unable to keep an appointment. What may interact with this medication? Other iron products This list may not describe all possible interactions. Give your health care provider a list of all the medicines, herbs, non-prescription drugs, or dietary supplements you use. Also tell them if you smoke, drink alcohol, or use illegal drugs. Some items may interact with your medicine. What should I watch for while using this medication? Visit your care team regularly. Tell your care team if your symptoms do not start to get better or if they get worse. You may need blood work done while you are taking this medication. You may need to follow a special diet. Talk to your care team. Foods that contain iron include: whole grains/cereals, dried fruits, beans, or peas, leafy green vegetables, and organ meats (liver, kidney). What side effects may I notice from receiving this medication? Side effects that you should report to your care team as soon as possible: Allergic reactions--skin rash, itching, hives, swelling of the  face, lips, tongue, or throat Low blood pressure--dizziness, feeling faint or lightheaded, blurry vision Shortness of breath Side effects that usually do not require medical attention (report to your care team if they continue or are bothersome): Flushing Headache Joint pain Muscle pain Nausea Pain, redness, or irritation at injection site This list may not describe all possible side effects. Call your doctor for medical  advice about side effects. You may report side effects to FDA at 1-800-FDA-1088. Where should I keep my medication? This medication is given in a hospital or clinic. It will not be stored at home. NOTE: This sheet is a summary. It may not cover all possible information. If you have questions about this medicine, talk to your doctor, pharmacist, or health care provider.  2024 Elsevier/Gold Standard (2022-12-04 00:00:00)     To help prevent nausea and vomiting after your treatment, we encourage you to take your nausea medication as directed.  BELOW ARE SYMPTOMS THAT SHOULD BE REPORTED IMMEDIATELY: *FEVER GREATER THAN 100.4 F (38 C) OR HIGHER *CHILLS OR SWEATING *NAUSEA AND VOMITING THAT IS NOT CONTROLLED WITH YOUR NAUSEA MEDICATION *UNUSUAL SHORTNESS OF BREATH *UNUSUAL BRUISING OR BLEEDING *URINARY PROBLEMS (pain or burning when urinating, or frequent urination) *BOWEL PROBLEMS (unusual diarrhea, constipation, pain near the anus) TENDERNESS IN MOUTH AND THROAT WITH OR WITHOUT PRESENCE OF ULCERS (sore throat, sores in mouth, or a toothache) UNUSUAL RASH, SWELLING OR PAIN  UNUSUAL VAGINAL DISCHARGE OR ITCHING   Items with * indicate a potential emergency and should be followed up as soon as possible or go to the Emergency Department if any problems should occur.  Please show the CHEMOTHERAPY ALERT CARD or IMMUNOTHERAPY ALERT CARD at check-in to the Emergency Department and triage nurse.  Should you have questions after your visit or need to cancel or reschedule your appointment, please contact [facility] [phone]  and follow the prompts.  Office hours are 8:00 a.m. to 4:30 p.m. Monday - Friday. Please note that voicemails left after 4:00 p.m. may not be returned until the following business day.  We are closed weekends and major holidays. You have access to a nurse at all times for urgent questions. Please call the main number to the clinic [phone] and follow  the prompts.  For any non-urgent questions, you may also contact your provider using MyChart. We now offer e-Visits for anyone 61 and older to request care online for non-urgent symptoms. For details visit [email or web address].   Also download the MyChart app! Go to the app store, search "MyChart", open the app, select [place], and log in with your MyChart username and password.

## 2023-04-29 NOTE — Progress Notes (Signed)
Patient presents today for iron infusion.  Patient is in satisfactory condition with no new complaints voiced.  Vital signs are stable.  IV placed in R arm.  IV flushed well with good blood return noted.  We will proceed with infusion per provider orders.    Patient tolerated iron infusion well with no complaints voiced.  Patient left ambulatory in stable condition.  Vital signs stable at discharge.  Follow up as scheduled.

## 2023-05-10 ENCOUNTER — Other Ambulatory Visit: Payer: Self-pay | Admitting: Hematology

## 2023-05-10 ENCOUNTER — Other Ambulatory Visit (HOSPITAL_COMMUNITY): Payer: Self-pay

## 2023-05-10 ENCOUNTER — Encounter: Payer: Self-pay | Admitting: Hematology

## 2023-05-11 ENCOUNTER — Encounter: Payer: Self-pay | Admitting: Hematology

## 2023-05-11 ENCOUNTER — Other Ambulatory Visit: Payer: Self-pay | Admitting: *Deleted

## 2023-05-11 MED ORDER — VARENICLINE TARTRATE 1 MG PO TABS: 1.0000 mg | ORAL_TABLET | Freq: Two times a day (BID) | ORAL | 3 refills | Status: DC

## 2023-05-14 ENCOUNTER — Encounter: Payer: Self-pay | Admitting: Primary Care

## 2023-05-17 ENCOUNTER — Emergency Department (HOSPITAL_COMMUNITY)
Admission: EM | Admit: 2023-05-17 | Discharge: 2023-05-17 | Disposition: A | Discharge status: Home / Self Care | Payer: Medicare HMO | Attending: Emergency Medicine | Admitting: Emergency Medicine

## 2023-05-17 ENCOUNTER — Encounter (HOSPITAL_COMMUNITY): Payer: Self-pay

## 2023-05-17 ENCOUNTER — Other Ambulatory Visit: Payer: Self-pay

## 2023-05-17 DIAGNOSIS — Z7982 Long term (current) use of aspirin: Secondary | ICD-10-CM | POA: Insufficient documentation

## 2023-05-17 DIAGNOSIS — Z79899 Other long term (current) drug therapy: Secondary | ICD-10-CM | POA: Insufficient documentation

## 2023-05-17 DIAGNOSIS — Z7902 Long term (current) use of antithrombotics/antiplatelets: Secondary | ICD-10-CM | POA: Diagnosis not present

## 2023-05-17 DIAGNOSIS — R319 Hematuria, unspecified: Secondary | ICD-10-CM | POA: Diagnosis present

## 2023-05-17 DIAGNOSIS — N3001 Acute cystitis with hematuria: Secondary | ICD-10-CM | POA: Insufficient documentation

## 2023-05-17 DIAGNOSIS — Z794 Long term (current) use of insulin: Secondary | ICD-10-CM | POA: Insufficient documentation

## 2023-05-17 LAB — URINALYSIS, ROUTINE W REFLEX MICROSCOPIC
Bilirubin Urine: NEGATIVE
Glucose, UA: NEGATIVE mg/dL
Ketones, ur: NEGATIVE mg/dL
Nitrite: POSITIVE — AB
Protein, ur: NEGATIVE mg/dL
RBC / HPF: >50 RBC/hpf (ref 0–5)
Specific Gravity, Urine: 1.011 (ref 1.005–1.030)
WBC, UA: >50 WBC/hpf (ref 0–5)
pH: 6 (ref 5.0–8.0)

## 2023-05-17 LAB — CBC WITH DIFFERENTIAL/PLATELET
Abs Immature Granulocytes: 0.01 10*3/uL (ref 0.00–0.07)
Basophils Absolute: 0 10*3/uL (ref 0.0–0.1)
Basophils Relative: 1 %
Eosinophils Absolute: 0.2 10*3/uL (ref 0.0–0.5)
Eosinophils Relative: 3 %
HCT: 37.7 % — ABNORMAL LOW (ref 39.0–52.0)
Hemoglobin: 11 g/dL — ABNORMAL LOW (ref 13.0–17.0)
Immature Granulocytes: 0 %
Lymphocytes Relative: 26 %
Lymphs Abs: 1.6 10*3/uL (ref 0.7–4.0)
MCH: 25.4 pg — ABNORMAL LOW (ref 26.0–34.0)
MCHC: 29.2 g/dL — ABNORMAL LOW (ref 30.0–36.0)
MCV: 87.1 fL (ref 80.0–100.0)
Monocytes Absolute: 0.5 10*3/uL (ref 0.1–1.0)
Monocytes Relative: 8 %
Neutro Abs: 3.8 10*3/uL (ref 1.7–7.7)
Neutrophils Relative %: 62 %
Platelets: 268 10*3/uL (ref 150–400)
RBC: 4.33 MIL/uL (ref 4.22–5.81)
RDW: 18.4 % — ABNORMAL HIGH (ref 11.5–15.5)
WBC: 6 10*3/uL (ref 4.0–10.5)
nRBC: 0 % (ref 0.0–0.2)

## 2023-05-17 LAB — BASIC METABOLIC PANEL
Anion gap: 8 (ref 5–15)
BUN: 14 mg/dL (ref 8–23)
CO2: 26 mmol/L (ref 22–32)
Calcium: 10.5 mg/dL — ABNORMAL HIGH (ref 8.9–10.3)
Chloride: 101 mmol/L (ref 98–111)
Creatinine, Ser: 1.25 mg/dL — ABNORMAL HIGH (ref 0.61–1.24)
GFR, Estimated: >60 mL/min (ref >60)
Glucose, Bld: 160 mg/dL — ABNORMAL HIGH (ref 70–99)
Potassium: 4 mmol/L (ref 3.5–5.1)
Sodium: 135 mmol/L (ref 135–145)

## 2023-05-17 MED ORDER — CEPHALEXIN 500 MG PO CAPS
500.0000 mg | ORAL_CAPSULE | Freq: Four times a day (QID) | ORAL | 0 refills | Status: DC
Start: 2023-05-17 — End: 2023-05-26

## 2023-05-17 MED ORDER — CEPHALEXIN 500 MG PO CAPS
500.0000 mg | ORAL_CAPSULE | Freq: Once | ORAL | Status: AC
  Administered 2023-05-17: 500 mg via ORAL
  Filled 2023-05-17: qty 1

## 2023-05-17 NOTE — ED Triage Notes (Signed)
Pt to er, pt states that starting Friday he has been having blood in his urine.  Pt states that it hurt when he urinated.

## 2023-05-17 NOTE — Discharge Instructions (Signed)
Follow up with your doctor in one week to have your urine rechecked to ensure the infection if resolved.   If you develop a fever, severe pain, vomiting or new concern, return to the ED immediately for further evaluation.

## 2023-05-17 NOTE — ED Provider Notes (Signed)
[place] EMERGENCY DEPARTMENT AT [facility] Provider Note   CSN: [id number] Arrival date & time: 05/17/23  1129     History  Chief Complaint  Patient presents with   Hematuria    James Zuniga is a 67 y.o. male.  Patient to ED with c/o blood in his urine for the past 3 days. No fever, nausea, vomiting. He reports some mild discomfort to the left lateral abdomen. He reports having hematuria with each urination until today. History of nephrolithiasis. No abdominal pain. No pain in the groin.   The history is provided by the patient and the spouse. No language interpreter was used.  Hematuria       Home Medications Prior to Admission medications   Medication Sig Start Date End Date Taking? Authorizing Provider  cephALEXin (KEFLEX) 500 MG capsule Take 1 capsule (500 mg total) by mouth 4 (four) times daily. 05/17/23  Yes Garo Heidelberg, Melvenia Beam, PA-C  albuterol (VENTOLIN HFA) 108 (90 Base) MCG/ACT inhaler Inhale 2 puffs into the lungs every 6 (six) hours as needed for wheezing or shortness of breath. 09/08/19   Coralyn Helling, MD  allopurinol (ZYLOPRIM) 300 MG tablet Take 300 mg by mouth daily.    [provider]  aspirin EC 81 MG tablet Take 81 mg by mouth daily. Swallow whole.    [provider]  atorvastatin (LIPITOR) 80 MG tablet Take 1 tablet (80 mg total) by mouth every morning. 12/25/16   Nyra Market, MD  Blood Glucose Monitoring Suppl (FIFTY50 GLUCOSE METER 2.0) w/Device KIT To check blood sugars TID or Use as instructed Include strips. Lancets, lancet device, control solution. batteries 04/11/19   [provider]  clopidogrel (PLAVIX) 75 MG tablet Take 1 tablet (75 mg total) by mouth daily. 04/03/23   Mansouraty, Netty Starring., MD  colchicine 0.6 MG tablet Take 0.6 mg by mouth daily. 03/04/21   [provider]  Cyanocobalamin (VITAMIN B-12 IJ) Inject 1,000 mg as directed See admin instructions. Every 30 days as needed    [provider]  diclofenac Sodium (VOLTAREN) 1 % GEL Apply 2 g topically daily as needed (pain). 04/07/21   [provider]  DULoxetine (CYMBALTA) 60 MG capsule Take 60 mg by mouth 2 (two) times daily.     [provider]  fluticasone (FLONASE) 50 MCG/ACT nasal spray Place 1 spray into both nostrils daily. 10/28/21   Coralyn Helling, MD  fluticasone furoate-vilanterol (BREO ELLIPTA) 200-25 MCG/ACT AEPB Inhale 1 puff into the lungs daily. 02/23/23   Glenford Bayley, NP  furosemide (LASIX) 20 MG tablet Take 20 mg by mouth every Tuesday, Thursday, and Saturday at 6 PM.  03/21/13   [provider]  gabapentin (NEURONTIN) 300 MG capsule Take 600 mg by mouth 3 (three) times daily. 10/18/14   [provider]  glucose blood test strip 1 each by Misc.(Non-Drug; Combo Route) route daily. 05/26/17   [provider]  Insulin Glargine (TOUJEO SOLOSTAR ) Inject 70 Units into the skin daily with supper. 04/28/21   [provider]  insulin NPH-regular Human (NOVOLIN 70/30) (70-30) 100 UNIT/ML injection Please inject 25 units twice daily before breakfast and dinner. Patient taking differently: Inject 25 Units into the skin 2 (two) times daily with a meal. Sliding scale 08/26/17   Arnetha Courser, MD  Insulin Pen Needle (FIFTY50 PEN NEEDLES) 32G X 4 MM MISC 1 each by Misc.(Non-Drug; Combo Route) route daily. 04/18/19   [provider]  ipratropium-albuterol (DUONEB) 0.5-2.5 (3)  MG/3ML SOLN USE 1 VIAL IN NEBULIZER 4 TIMES DAILY Patient taking differently: Take 3 mLs by nebulization 4 (four) times daily. 05/22/21   Coralyn Helling, MD  Lancets (ONETOUCH ULTRASOFT) lancets USE 1 LANCET TO CHECK GLUCOSE IN THE MORNING BEFORE BREAKFAST 03/28/18   [provider]  losartan (COZAAR) 100 MG tablet Take 100 mg by mouth daily. 10/15/16   [provider]  meclizine (ANTIVERT) 12.5 MG tablet Take 1 tablet (12.5 mg total) by mouth 3 (three) times daily as needed for dizziness.  07/23/21   Charlynne Pander, MD  methocarbamol (ROBAXIN) 500 MG tablet Take 500 mg by mouth 3 (three) times daily as needed for muscle spasms. 04/09/21   [provider]  metoprolol succinate (TOPROL-XL) 100 MG 24 hr tablet Take 100 mg by mouth daily. 07/01/22   [provider]  pantoprazole (PROTONIX) 20 MG tablet Take 1 tablet (20 mg total) by mouth 2 (two) times daily before a meal. 04/01/23   Mansouraty, Netty Starring., MD  potassium chloride SA (K-DUR,KLOR-CON) 20 MEQ tablet Take 1 tablet (20 mEq total) by mouth 2 (two) times daily. Patient taking differently: Take 20 mEq by mouth 3 (three) times daily. Take 1 tablet (20 meq) scheduled three times daily & takes an additional tablet at lunch on Tuesdays, Thursdays, & Saturdays due to lasix 05/03/15   Josph Macho, MD  primidone (MYSOLINE) 50 MG tablet Take 100 mg by mouth at bedtime.  01/26/17   [provider]  spironolactone (ALDACTONE) 25 MG tablet Take 25 mg by mouth every Monday, Wednesday, and Friday. 08/18/19   [provider]  tamsulosin (FLOMAX) 0.4 MG CAPS capsule Take 1 capsule (0.4 mg total) by mouth daily after breakfast. Patient taking differently: Take 0.4 mg by mouth at bedtime. 12/06/15   Josph Macho, MD  tiZANidine (ZANAFLEX) 4 MG tablet Take 4 mg by mouth 3 (three) times daily as needed for muscle spasms.  10/04/14   [provider]  traMADol (ULTRAM) 50 MG tablet Take 50 mg by mouth daily as needed for moderate pain.  09/08/13   [provider]  varenicline (CHANTIX) 1 MG tablet Take 1 tablet (1 mg total) by mouth 2 (two) times daily. 05/11/23   Doreatha Massed, MD  zolpidem (AMBIEN CR) 12.5 MG CR tablet Take 12.5 mg by mouth at bedtime. 05/12/21   [provider]      Allergies    Patient has no known allergies.    Review of Systems   Review of Systems  Genitourinary:  Positive for hematuria.    Physical Exam Updated Vital Signs BP 124/86   Pulse 73    Temp 98.6 F (37 C)   Resp 18   Ht 5\' 6"  (1.676 m)   Wt 106.6 kg   SpO2 98%   BMI 37.93 kg/m  Physical Exam Vitals and nursing note reviewed.  Constitutional:      Appearance: Normal appearance.  Cardiovascular:     Rate and Rhythm: Normal rate.  Pulmonary:     Effort: Pulmonary effort is normal.  Abdominal:     Palpations: Abdomen is soft.     Tenderness: There is no left CVA tenderness.     Comments: Minimally tender left lateral abdominal wall.   Musculoskeletal:        General: Normal range of motion.  Skin:    General: Skin is warm and dry.  Neurological:     Mental Status: He is alert and oriented to person,  place, and time.     ED Results / Procedures / Treatments   Labs (all labs ordered are listed, but only abnormal results are displayed) Labs Reviewed  URINALYSIS, ROUTINE W REFLEX MICROSCOPIC - Abnormal; Notable for the following components:      Result Value   APPearance HAZY (*)    Hgb urine dipstick LARGE (*)    Nitrite POSITIVE (*)    Leukocytes,Ua MODERATE (*)    Bacteria, UA FEW (*)    All other components within normal limits  BASIC METABOLIC PANEL - Abnormal; Notable for the following components:   Glucose, Bld 160 (*)    Creatinine, Ser 1.25 (*)    Calcium 10.5 (*)    All other components within normal limits  CBC WITH DIFFERENTIAL/PLATELET - Abnormal; Notable for the following components:   Hemoglobin 11.0 (*)    HCT 37.7 (*)    MCH 25.4 (*)    MCHC 29.2 (*)    RDW 18.4 (*)    All other components within normal limits   Results for orders placed or performed during the hospital encounter of 05/17/23  Urinalysis, Routine w reflex microscopic -Urine, Clean Catch  Result Value Ref Range   Color, Urine YELLOW YELLOW   APPearance HAZY (A) CLEAR   Specific Gravity, Urine 1.011 1.005 - 1.030   pH 6.0 5.0 - 8.0   Glucose, UA NEGATIVE NEGATIVE mg/dL   Hgb urine dipstick LARGE (A) NEGATIVE   Bilirubin Urine NEGATIVE NEGATIVE   Ketones, ur  NEGATIVE NEGATIVE mg/dL   Protein, ur NEGATIVE NEGATIVE mg/dL   Nitrite POSITIVE (A) NEGATIVE   Leukocytes,Ua MODERATE (A) NEGATIVE   RBC / HPF >50 0 - 5 RBC/hpf   WBC, UA >50 0 - 5 WBC/hpf   Bacteria, UA FEW (A) NONE SEEN   Squamous Epithelial / HPF 0-5 0 - 5 /HPF   Mucus PRESENT   Basic metabolic panel  Result Value Ref Range   Sodium 135 135 - 145 mmol/L   Potassium 4.0 3.5 - 5.1 mmol/L   Chloride 101 98 - 111 mmol/L   CO2 26 22 - 32 mmol/L   Glucose, Bld 160 (H) 70 - 99 mg/dL   BUN 14 8 - 23 mg/dL   Creatinine, Ser 1.61 (H) 0.61 - 1.24 mg/dL   Calcium 09.6 (H) 8.9 - 10.3 mg/dL   GFR, Estimated >04 >54 mL/min   Anion gap 8 5 - 15  CBC with Differential  Result Value Ref Range   WBC 6.0 4.0 - 10.5 K/uL   RBC 4.33 4.22 - 5.81 MIL/uL   Hemoglobin 11.0 (L) 13.0 - 17.0 g/dL   HCT 09.8 (L) 11.9 - 14.7 %   MCV 87.1 80.0 - 100.0 fL   MCH 25.4 (L) 26.0 - 34.0 pg   MCHC 29.2 (L) 30.0 - 36.0 g/dL   RDW 82.9 (H) 56.2 - 13.0 %   Platelets 268 150 - 400 K/uL   nRBC 0.0 0.0 - 0.2 %   Neutrophils Relative % 62 %   Neutro Abs 3.8 1.7 - 7.7 K/uL   Lymphocytes Relative 26 %   Lymphs Abs 1.6 0.7 - 4.0 K/uL   Monocytes Relative 8 %   Monocytes Absolute 0.5 0.1 - 1.0 K/uL   Eosinophils Relative 3 %   Eosinophils Absolute 0.2 0.0 - 0.5 K/uL   Basophils Relative 1 %   Basophils Absolute 0.0 0.0 - 0.1 K/uL   Immature Granulocytes 0 %   Abs Immature Granulocytes 0.01  0.00 - 0.07 K/uL     EKG None  Radiology No results found.  Procedures Procedures    Medications Ordered in ED Medications  cephALEXin (KEFLEX) capsule 500 mg (500 mg Oral Given 05/17/23 1419)    ED Course/ Medical Decision Making/ A&P Clinical Course as of 05/17/23 1529  Mon May 17, 2023  1529 Patient to ED after seeing blood in his urine for 3 days. No fever, vomiting, history of similar. He is very well appearing, VSS. There is evidence of UTI with blood, nitrite positive, >50 WBC. No leukocytosis. Renal  function at baseline. No symptoms at present. Will start on Keflex, see PCP in one week for recheck of urine. Return precautions discussed.  [SU]    Clinical Course User Index [SU] Elpidio Anis, PA-C                                 Medical Decision Making Amount and/or Complexity of Data Reviewed Labs: ordered.  Risk Prescription drug management.           Final Clinical Impression(s) / ED Diagnoses Final diagnoses:  Acute cystitis with hematuria    Rx / DC Orders ED Discharge Orders          Ordered    cephALEXin (KEFLEX) 500 MG capsule  4 times daily        05/17/23 1452              Elpidio Anis, PA-C 05/17/23 1529    Cathren Laine, MD 05/18/23 1600

## 2023-05-26 ENCOUNTER — Ambulatory Visit: Payer: Medicare Other | Admitting: Primary Care

## 2023-05-26 ENCOUNTER — Ambulatory Visit (HOSPITAL_BASED_OUTPATIENT_CLINIC_OR_DEPARTMENT_OTHER): Payer: Medicare HMO | Admitting: Pulmonary Disease

## 2023-05-26 ENCOUNTER — Encounter (HOSPITAL_BASED_OUTPATIENT_CLINIC_OR_DEPARTMENT_OTHER): Payer: Self-pay | Admitting: Pulmonary Disease

## 2023-05-26 VITALS — BP 102/78 | HR 94 | Resp 18 | Ht 66.0 in | Wt 237.4 lb

## 2023-05-26 DIAGNOSIS — J449 Chronic obstructive pulmonary disease, unspecified: Secondary | ICD-10-CM | POA: Diagnosis not present

## 2023-05-26 DIAGNOSIS — I5022 Chronic systolic (congestive) heart failure: Secondary | ICD-10-CM

## 2023-05-26 DIAGNOSIS — G4733 Obstructive sleep apnea (adult) (pediatric): Secondary | ICD-10-CM | POA: Diagnosis not present

## 2023-05-26 NOTE — Progress Notes (Signed)
   Subjective:    Patient ID: James Zuniga, male    DOB: Dec 31, 1955, 67 y.o.   MRN: [id number]  HPI 67 year old man from [place] for FU of OSA & COPD  former smoker quit in 2019  -CPAP 11 cm   PMH : hypertension,  chronic systolic heart failure, cardiomyopathy,  stroke,  diabetes type 2, gout, hyperlipidemia, phonic iron deficiency anemia, obesity  MGUS    DME :  [facility] in [place]   Accompanied by wife Karena Addison.  He presents to establish care.  He complains of dyspnea.  He is compliant with Breo.  He has pedal edema.  He takes Lasix 20 mg every other day.  He ambulates with a cane. He has been struggling with his mask.  Wife has noted a large leak.  There are nights when he falls asleep in the den and misses out on his mask.  He has had some problems with his DME due to unpaid bills.  He has been told that he will not be eligible for a new machine until 2026.  Wife feels that his current mask does not fit well  He was recently diagnosed with MGUS, PET scan did not show any lesions   Significant tests/ events reviewed  PFT 01/27/13 >> FEV1 2.50 (84%), FEV1% 83, TLC 4.06 (61%), DLCO 60%, no BD PFT 04/02/21 >> FEV1 2.19 (78%), FEV1% 77, TLC 5.34 (80%), DLCO 76% PSG 04/21/19 >> AHI 15.5, SpO2 low 83% CPAP titration 05/17/19 >> CPAP 11 cm H2O CT chest 03/02/13 >> mild paraseptal emphysema, 4 mm RUL nodule CT chest 07/26/15 >> stable RUL nodule   Review of Systems neg for any significant sore throat, dysphagia, itching, sneezing, nasal congestion or excess/ purulent secretions, fever, chills, sweats, unintended wt loss, pleuritic or exertional cp, hempoptysis, orthopnea pnd or change in chronic leg swelling. Also denies presyncope, palpitations, heartburn, abdominal pain, nausea, vomiting, diarrhea or change in bowel or urinary habits, dysuria,hematuria, rash, arthralgias, visual complaints, headache, numbness weakness or ataxia.     Objective:   Physical  Exam  Gen. Pleasant, obese, in no distress ENT - no lesions, no post nasal drip Neck: No JVD, no thyromegaly, no carotid bruits Lungs: no use of accessory muscles, no dullness to percussion, decreased without rales or rhonchi  Cardiovascular: Rhythm regular, heart sounds  normal, no murmurs or gallops, 2+ peripheral edema Musculoskeletal: No deformities, no cyanosis or clubbing , no tremors       Assessment & Plan:

## 2023-05-26 NOTE — Assessment & Plan Note (Signed)
CPAP download was reviewed which shows good control of events on 11 cm.  He has reasonable compliance but a few missed nights when he falls asleep in the 10 without using his machine. His issue seems to be of mask related rather than CPAP related and I explained to him that he does not need a new machine.  We discussed different types of mask and he would be willing to trial AirFit F30 fullface mask.  Will send a prescription to his supply company.  I also offered a mask fitting session  Weight loss encouraged, compliance with goal of at least 4-6 hrs every night is the expectation. Advised against medications with sedative side effects Cautioned against driving when sleepy - understanding that sleepiness will vary on a day to day basis

## 2023-05-26 NOTE — Assessment & Plan Note (Signed)
Dyspnea seems to be related to fluid buildup due to chronic systolic heart failure rather than COPD. Advised him to take Lasix daily until his weight drops by 5 pounds to 232 or pedal edema improves.  If he has persistent dyspnea, would benefit from cardiopulmonary rehab program

## 2023-05-26 NOTE — Patient Instructions (Addendum)
x take Lasix 20 mg daily for 1 week or until weight comes down to 232 pounds  X call [phone] for mask fitting appointment X Rx Trial of AirFit F30 fullface mask, medium size  Continue on Breo  RSV shot advised from pharmacy

## 2023-05-26 NOTE — Assessment & Plan Note (Signed)
Continue on Breo. Recent PET scan can serve as screening study

## 2023-06-16 ENCOUNTER — Ambulatory Visit (HOSPITAL_BASED_OUTPATIENT_CLINIC_OR_DEPARTMENT_OTHER): Payer: Medicare HMO | Attending: Pulmonary Disease | Admitting: Radiology

## 2023-06-16 DIAGNOSIS — G4733 Obstructive sleep apnea (adult) (pediatric): Secondary | ICD-10-CM

## 2023-08-17 ENCOUNTER — Inpatient Hospital Stay: Payer: Medicare HMO | Attending: Hematology

## 2023-08-17 DIAGNOSIS — G629 Polyneuropathy, unspecified: Secondary | ICD-10-CM | POA: Diagnosis not present

## 2023-08-17 DIAGNOSIS — D472 Monoclonal gammopathy: Secondary | ICD-10-CM | POA: Insufficient documentation

## 2023-08-17 DIAGNOSIS — Z87440 Personal history of urinary (tract) infections: Secondary | ICD-10-CM | POA: Diagnosis not present

## 2023-08-17 DIAGNOSIS — D509 Iron deficiency anemia, unspecified: Secondary | ICD-10-CM | POA: Diagnosis not present

## 2023-08-17 DIAGNOSIS — D631 Anemia in chronic kidney disease: Secondary | ICD-10-CM | POA: Diagnosis not present

## 2023-08-17 DIAGNOSIS — N189 Chronic kidney disease, unspecified: Secondary | ICD-10-CM | POA: Insufficient documentation

## 2023-08-17 DIAGNOSIS — D500 Iron deficiency anemia secondary to blood loss (chronic): Secondary | ICD-10-CM

## 2023-08-17 LAB — COMPREHENSIVE METABOLIC PANEL
ALT: 17 U/L (ref 0–44)
AST: 19 U/L (ref 15–41)
Albumin: 3.3 g/dL — ABNORMAL LOW (ref 3.5–5.0)
Alkaline Phosphatase: 125 U/L (ref 38–126)
Anion gap: 9 (ref 5–15)
BUN: 9 mg/dL (ref 8–23)
CO2: 25 mmol/L (ref 22–32)
Calcium: 9.9 mg/dL (ref 8.9–10.3)
Chloride: 100 mmol/L (ref 98–111)
Creatinine, Ser: 1.29 mg/dL — ABNORMAL HIGH (ref 0.61–1.24)
GFR, Estimated: >60 mL/min (ref >60)
Glucose, Bld: 301 mg/dL — ABNORMAL HIGH (ref 70–99)
Potassium: 4 mmol/L (ref 3.5–5.1)
Sodium: 134 mmol/L — ABNORMAL LOW (ref 135–145)
Total Bilirubin: 0.4 mg/dL (ref 0.0–1.2)
Total Protein: 7.7 g/dL (ref 6.5–8.1)

## 2023-08-17 LAB — CBC WITH DIFFERENTIAL/PLATELET
Abs Immature Granulocytes: 0.03 10*3/uL (ref 0.00–0.07)
Basophils Absolute: 0 10*3/uL (ref 0.0–0.1)
Basophils Relative: 1 %
Eosinophils Absolute: 0.2 10*3/uL (ref 0.0–0.5)
Eosinophils Relative: 3 %
HCT: 34.2 % — ABNORMAL LOW (ref 39.0–52.0)
Hemoglobin: 10.1 g/dL — ABNORMAL LOW (ref 13.0–17.0)
Immature Granulocytes: 0 %
Lymphocytes Relative: 23 %
Lymphs Abs: 1.6 10*3/uL (ref 0.7–4.0)
MCH: 24.9 pg — ABNORMAL LOW (ref 26.0–34.0)
MCHC: 29.5 g/dL — ABNORMAL LOW (ref 30.0–36.0)
MCV: 84.2 fL (ref 80.0–100.0)
Monocytes Absolute: 0.5 10*3/uL (ref 0.1–1.0)
Monocytes Relative: 7 %
Neutro Abs: 4.4 10*3/uL (ref 1.7–7.7)
Neutrophils Relative %: 66 %
Platelets: 366 10*3/uL (ref 150–400)
RBC: 4.06 MIL/uL — ABNORMAL LOW (ref 4.22–5.81)
RDW: 15.4 % (ref 11.5–15.5)
WBC: 6.7 10*3/uL (ref 4.0–10.5)
nRBC: 0 % (ref 0.0–0.2)

## 2023-08-17 LAB — IRON AND TIBC
Iron: 45 ug/dL (ref 45–182)
Saturation Ratios: 14 % — ABNORMAL LOW (ref 17.9–39.5)
TIBC: 332 ug/dL (ref 250–450)
UIBC: 287 ug/dL

## 2023-08-17 LAB — FERRITIN: Ferritin: 32 ng/mL (ref 24–336)

## 2023-08-18 LAB — KAPPA/LAMBDA LIGHT CHAINS
Kappa free light chain: 23.2 mg/L — ABNORMAL HIGH (ref 3.3–19.4)
Kappa, lambda light chain ratio: 1.1 (ref 0.26–1.65)
Lambda free light chains: 21 mg/L (ref 5.7–26.3)

## 2023-08-24 ENCOUNTER — Ambulatory Visit (HOSPITAL_COMMUNITY)
Admission: RE | Admit: 2023-08-24 | Discharge: 2023-08-24 | Disposition: A | Discharge status: Home / Self Care | Payer: Medicare HMO | Source: Ambulatory Visit | Attending: Hematology | Admitting: Hematology

## 2023-08-24 ENCOUNTER — Inpatient Hospital Stay: Payer: Medicare HMO | Admitting: Hematology

## 2023-08-24 VITALS — BP 120/78 | HR 88 | Temp 97.3°F | Resp 18 | Ht 66.0 in | Wt 240.6 lb

## 2023-08-24 DIAGNOSIS — D472 Monoclonal gammopathy: Secondary | ICD-10-CM

## 2023-08-24 LAB — PROTEIN ELECTROPHORESIS, SERUM
A/G Ratio: 0.8 (ref 0.7–1.7)
Albumin ELP: 3.2 g/dL (ref 2.9–4.4)
Alpha-1-Globulin: 0.3 g/dL (ref 0.0–0.4)
Alpha-2-Globulin: 1.1 g/dL — ABNORMAL HIGH (ref 0.4–1.0)
Beta Globulin: 0.9 g/dL (ref 0.7–1.3)
Gamma Globulin: 1.7 g/dL (ref 0.4–1.8)
Globulin, Total: 3.9 g/dL (ref 2.2–3.9)
M-Spike, %: 1.3 g/dL — ABNORMAL HIGH
Total Protein ELP: 7.1 g/dL (ref 6.0–8.5)

## 2023-08-24 NOTE — Patient Instructions (Signed)
[facility] at [facility] Discharge Instructions   You were seen and examined today by Dr. Ellin Saba.  He reviewed the results of your lab work which are mostly normal/stable. Your iron and your hemoglobin have dropped. We will plan to give you an iron infusion to help correct that.   We will see you back in 4 months. We will repeat lab work prior to this visit.    Return as scheduled.    Thank you for choosing [facility] at [facility] to provide your oncology and hematology care.  To afford each patient quality time with our provider, please arrive at least 15 minutes before your scheduled appointment time.   If you have a lab appointment with the Cancer Center please come in thru the Main Entrance and check in at the main information desk.  You need to re-schedule your appointment should you arrive 10 or more minutes late.  We strive to give you quality time with our providers, and arriving late affects you and other patients whose appointments are after yours.  Also, if you no show three or more times for appointments you may be dismissed from the clinic at the providers discretion.     Again, thank you for choosing [facility].  Our hope is that these requests will decrease the amount of time that you wait before being seen by our physicians.       _____________________________________________________________  Should you have questions after your visit to [facility], please contact our office at [phone] and follow the prompts.  Our office hours are 8:00 a.m. and 4:30 p.m. Monday - Friday.  Please note that voicemails left after 4:00 p.m. may not be returned until the following business day.  We are closed weekends and major holidays.  You do have access to a nurse 24-7, just call the main number to the clinic [phone] and do not press any options, hold on the line and a nurse will answer the phone.    For  prescription refill requests, have your pharmacy contact our office and allow 72 hours.    Due to Covid, you will need to wear a mask upon entering the hospital. If you do not have a mask, a mask will be given to you at the Main Entrance upon arrival. For doctor visits, patients may have 1 support person age 86 or older with them. For treatment visits, patients can not have anyone with them due to social distancing guidelines and our immunocompromised population.

## 2023-08-24 NOTE — Progress Notes (Signed)
[facility] [address] [place] [address]    Clinic Day:  08/24/23   Referring physician: Iona Hansen, NP  Patient Care Team: James Hansen, NP as PCP - General (Nurse Practitioner) James Cookey, MD (Inactive) as Consulting Physician (Gastroenterology) James Massed, MD as Medical Oncologist (Hematology)   ASSESSMENT & PLAN:   Assessment: 1.  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 - Colonoscopy (08/26/2017) normal. - Small bowel endoscopy (05/24/2018): 1 gastric polyp, 1 nonbleeding angiectasia in duodenum treated with APC.  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  Normal duodenum, first second and third parts. - He was receiving intermittent Venofer infusions, last 1 on 09/08/2021. - He was also receiving B12 injections, last 1 on 05/25/2022. - BMBX (10/06/2022): Hypercellular marrow (60%) involved by plasma cell neoplasm, 9% plasma cells by manual aspirate differential and 10% by CD138 IHC.  No evidence of morphologic dysplasia.  No ring sideroblasts. - Myeloma FISH panel: t(11;14) consistent with standard risk.  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  Social/family history: - Lives with wife at home.  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  Quit smoking 5 months ago.  Smoked 1/3 pack/day for 50 years. - Multiple maternal aunts and uncles had cancers.  Patient does not know the types.    Plan: 1.  IgG lambda smoldering multiple myeloma: - He had 1 UTI last year.  No recurrent infections.  No new bone pains. - Reviewed labs from 08/17/2023: Creatinine 1.29, calcium 9.9, albumin 3.3.  CBC grossly normal.  Free light chain ratio is normal. - Will do skeletal survey today.  Will follow-up on SPEP.  RTC 4 months for follow-up with repeat myeloma labs.   2.  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  Denies  BRBPR/melena.  Last Feraheme on 04/29/2023. - Ferritin is 32, down from 82.  Hemoglobin 10.1 down from 12.1.  Recommend INFeD 1 g IV x 1.    Orders Placed This Encounter  Procedures   DG Bone Survey Met    Standing Status:   Future    Expected Date:   08/24/2023    Expiration Date:   08/23/2024    Reason for Exam (SYMPTOM  OR DIAGNOSIS REQUIRED):   smoldering myeloma    Preferred imaging location?:   [facility]   CBC with Differential    Standing Status:   Future    Expected Date:   12/15/2023    Expiration Date:   08/23/2024   Comprehensive metabolic panel    Standing Status:   Future    Expected Date:   12/15/2023    Expiration Date:   08/23/2024   Kappa/lambda light chains    Standing Status:   Future    Expected Date:   12/15/2023    Expiration Date:   08/23/2024   Protein electrophoresis, serum    Standing Status:   Future    Expected Date:   12/15/2023    Expiration Date:   08/23/2024   Iron and TIBC ([facility] ONLY)    Standing Status:   Future    Expected Date:   12/15/2023    Expiration Date:   08/23/2024   Ferritin    Standing Status:   Future    Expected Date:   12/15/2023    Expiration Date:  08/23/2024   Ambulatory referral to Social Work    Referral Priority:   Routine    Referral Type:   Consultation    Referral Reason:   Specialty Services Required    Number of Visits Requested:   1     I,James Zuniga,acting as a Neurosurgeon for James Massed, MD.,have documented all relevant documentation on the behalf of James Massed, MD,as directed by  James Massed, MD while in the presence of James Massed, MD.  I, James Massed MD, have reviewed the above documentation for accuracy and completeness, and I agree with the above.     James Massed, MD   2/11/20252:48 PM  CHIEF COMPLAINT:   Diagnosis: Smoldering myeloma; Normocytic anemia    Cancer Staging  No matching staging information was found for the  patient.    Prior Therapy: Venofer and B12 injections   Current Therapy:  Feraheme    HISTORY OF PRESENT ILLNESS:   Oncology History  Chronic iron deficiency anemia     INTERVAL HISTORY:   James Zuniga is a 68 y.o. male presenting to clinic today for follow up of Smoldering myeloma and normocytic anemia. He was last seen by me on 04/13/23.  Since his last visit, he presented to the ED on 05/17/23 for acute cystitis with hematuria and evidence of UTI. He was prescribed Keflex 500 mg 4 times daily.   Today, he states that he is doing well overall. His appetite level is at 100%. His energy level is at 0%. [name] is accompanied by his wife. He reports decreased energy levels and feeling cold often. His back pain is chronic and stable. Other than his UTI, he denies any infections in the last 4 months. 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 He denies any BRBPR or melena.   He was seen by neurology, who are testing him for Parkinson's, as he hand tremors and difficulty grasping objects. No family history of Parkinson's.  PAST MEDICAL HISTORY:   Past Medical History: Past Medical History:  Diagnosis Date   Arthritis    "all over" (08/24/2017)   CHF (congestive heart failure) (HCC)    Chronic airway obstruction (HCC)    Chronic lower back pain    Coronary artery disease    Disorder of liver    "spots on my liver; they've rechecked them; they haven't changed" (08/24/2017)   Dyspnea    Esophageal reflux    Gout    "on daily RX" (08/24/2017)   Hyperlipidemia    Hypertension    Hypokalemia 11/2016   Hypotestosteronism 07/01/2013   Insomnia    Iron deficiency anemia, unspecified 07/01/2013   Memory loss    On home oxygen therapy    "2L prn" (08/24/2017)   OSA on CPAP    PVD (peripheral vascular disease) (HCC)    Renal disorder    Stroke (HCC) 01/2017   "left sided weakness remains" (08/24/2017)   Type II diabetes mellitus (HCC)    Vitamin D deficiency      Surgical History: Past Surgical History:  Procedure Laterality Date   BIOPSY  11/06/2022   Procedure: BIOPSY;  Surgeon: James Ade, MD;  [facility];  Service: Endoscopy;;   BIOPSY  04/01/2023   Procedure: BIOPSY;  Surgeon: Lemar Lofty., MD;  Location: [facility];  Service: Gastroenterology;;   COLONOSCOPY N/A 08/26/2017   Procedure: COLONOSCOPY;  Surgeon: Iva Boop, MD;  Location: [facility] ENDOSCOPY;  Service: Endoscopy;  Laterality: N/A;  COLONOSCOPY WITH PROPOFOL N/A 11/06/2022   Procedure: COLONOSCOPY WITH PROPOFOL;  Surgeon: James Ade, MD;  [facility];  Service: Endoscopy;  Laterality: N/A;  1:15 PM   ENTEROSCOPY N/A 05/24/2018   Procedure: ENTEROSCOPY;  Surgeon: Shellia Cleverly, DO;  Location: [facility] END[facility]COPY;  Service: Gastroenterology;  Laterality: N/A;   ESOPHAGOGASTRODUODENOSCOPY N/A 08/26/2017   Procedure: ESOPHAGOGASTRODUODENOSCOPY (EGD);  Surgeon: Iva Boop, MD;  Location: [facility] ENDOSCOPY;  Service: Endoscopy;  Laterality: N/A;   ESOPHAGOGASTRODUODENOSCOPY (EGD) WITH PROPOFOL N/A 11/06/2022   Procedure: ESOPHAGOGASTRODUODENOSCOPY (EGD) WITH PROPOFOL;  Surgeon: James Ade, MD;  [facility];  Service: Endoscopy;  Laterality: N/A;   ESOPHAGOGASTRODUODENOSCOPY (EGD) WITH PROPOFOL N/A 04/01/2023   Procedure: ESOPHAGOGASTRODUODENOSCOPY (EGD) WITH PROPOFOL;  Surgeon: Meridee Score Netty Starring., MD;  Location: [facility];  Service: Gastroenterology;  Laterality: N/A;   EUS N/A 04/01/2023   Procedure: UPPER ENDOSCOPIC ULTRASOUND (EUS) RADIAL;  Surgeon: Lemar Lofty., MD;  Location: [facility];  Service: Gastroenterology;  Laterality: N/A;   HEMOSTASIS CLIP PLACEMENT  04/01/2023   Procedure: HEMOSTASIS CLIP PLACEMENT;  Surgeon: Lemar Lofty., MD;  Location: [facility];  Service: Gastroenterology;;   HOT HEMOSTASIS N/A 05/24/2018   Procedure: HOT HEMOSTASIS (ARGON PLASMA COAGULATION/BICAP);  Surgeon:  Shellia Cleverly, DO;  Location: [facility];  Service: Gastroenterology;  Laterality: N/A;   KNEE ARTHROSCOPY Left    LOOP RECORDER IMPLANT  06/2017   POLYPECTOMY  05/24/2018   Procedure: POLYPECTOMY;  Surgeon: Shellia Cleverly, DO;  Location: [facility];  Service: Gastroenterology;;   POLYPECTOMY  11/06/2022   Procedure: POLYPECTOMY;  Surgeon: James Ade, MD;  [facility];  Service: Endoscopy;;   POLYPECTOMY  04/01/2023   Procedure: POLYPECTOMY;  Surgeon: Lemar Lofty., MD;  Location: [facility];  Service: Gastroenterology;;   TEE WITHOUT CARDIOVERSION N/A 12/24/2016   Procedure: TRANSESOPHAGEAL ECHOCARDIOGRAM (TEE);  Surgeon: Pricilla Riffle, MD;  Location: [facility] ENDOSCOPY;  Service: Cardiovascular;  Laterality: N/A;    Social History: Social History   Socioeconomic History   Marital status: Married    Spouse name: Not on file   Number of children: Not on file   Years of education: Not on file   Highest education level: Not on file  Occupational History   Occupation: diabled    Comment: as of early 1990s  Tobacco Use   Smoking status: Former    Current packs/day: 0.00    Average packs/day: 0.5 packs/day for 40.0 years (20.0 ttl pk-yrs)    Types: Cigarettes    Start date: 08/05/1983    Quit date: 08/15/2017    Years since quitting: 6.0   Smokeless tobacco: Never  Vaping Use   Vaping status: Never Used  Substance and Sexual Activity   Alcohol use: No    Alcohol/week: 0.0 standard drinks of alcohol   Drug use: No   Sexual activity: Not Currently  Other Topics Concern   Not on file  Social History Narrative   Not on file   Social Drivers of Health   Financial Resource Strain: Not on file  Food Insecurity: Low Risk  (03/02/2023)   Received from [facility]   Hunger Vital Sign    Worried About Running Out of Food in the Last Year: Never true    Ran Out of Food in the Last Year: Never true  Transportation Needs: No Transportation Needs  (07/17/2022)   PRAPARE - Administrator, Civil Service (Medical): No  Lack of Transportation (Non-Medical): No  Physical Activity: Not on file  Stress: Not on file  Social Connections: Not on file  Intimate Partner Violence: Not At Risk (07/17/2022)   Humiliation, Afraid, Rape, and Kick questionnaire    Fear of Current or Ex-Partner: No    Emotionally Abused: No    Physically Abused: No    Sexually Abused: No    Family History: Family History  Problem Relation Age of Onset   Heart disease Mother    Diabetes Father    Stroke Brother    Stroke Paternal Uncle    Diabetes Sister    Heart attack Brother    Heart attack Brother     Current Medications:  Current Outpatient Medications:    albuterol (VENTOLIN HFA) 108 (90 Base) MCG/ACT inhaler, Inhale 2 puffs into the lungs every 6 (six) hours as needed for wheezing or shortness of breath., Disp: 18 g, Rfl: 3   allopurinol (ZYLOPRIM) 300 MG tablet, Take 300 mg by mouth daily., Disp: , Rfl:    aspirin EC 81 MG tablet, Take 81 mg by mouth daily. Swallow whole., Disp: , Rfl:    atorvastatin (LIPITOR) 80 MG tablet, Take 1 tablet (80 mg total) by mouth every morning., Disp: 30 tablet, Rfl: 0   Blood Glucose Monitoring Suppl (FIFTY50 GLUCOSE METER 2.0) w/Device KIT, To check blood sugars TID or Use as instructed Include strips. Lancets, lancet device, control solution. batteries, Disp: , Rfl:    clopidogrel (PLAVIX) 75 MG tablet, Take 1 tablet (75 mg total) by mouth daily., Disp: , Rfl:    colchicine 0.6 MG tablet, Take 0.6 mg by mouth daily., Disp: , Rfl:    Continuous Glucose Sensor (FREESTYLE LIBRE 3 SENSOR) MISC, , Disp: , Rfl:    Cyanocobalamin (VITAMIN B-12 IJ), Inject 1,000 mg as directed See admin instructions. Every 30 days as needed, Disp: , Rfl:    diclofenac Sodium (VOLTAREN) 1 % GEL, Apply 2 g topically daily as needed (pain)., Disp: , Rfl:    DULoxetine (CYMBALTA) 60 MG capsule, Take 60 mg by mouth 2 (two) times  daily. , Disp: , Rfl:    fluticasone (FLONASE) 50 MCG/ACT nasal spray, Place 1 spray into both nostrils daily., Disp: 16 g, Rfl: 2   fluticasone furoate-vilanterol (BREO ELLIPTA) 200-25 MCG/ACT AEPB, Inhale 1 puff into the lungs daily., Disp: 1 each, Rfl: 5   furosemide (LASIX) 20 MG tablet, Take 20 mg by mouth every Tuesday, Thursday, and Saturday at 6 PM. , Disp: , Rfl:    gabapentin (NEURONTIN) 300 MG capsule, Take 600 mg by mouth 3 (three) times daily., Disp: , Rfl:    glucose blood test strip, 1 each by Misc.(Non-Drug; Combo Route) route daily., Disp: , Rfl:    HUMULIN R 100 UNIT/ML injection, Use as sliding scale up to 10 units tid with meals, Disp: , Rfl:    Insulin Glargine (TOUJEO SOLOSTAR [place]), Inject 70 Units into the skin daily with supper., Disp: , Rfl:    insulin NPH-regular Human (NOVOLIN 70/30) (70-30) 100 UNIT/ML injection, Please inject 25 units twice daily before breakfast and dinner. (Patient taking differently: Inject 25 Units into the skin 2 (two) times daily with a meal. Sliding scale), Disp: 10 mL, Rfl: 3   Insulin Pen Needle (FIFTY50 PEN NEEDLES) 32G X 4 MM MISC, 1 each by Misc.(Non-Drug; Combo Route) route daily., Disp: , Rfl:    ipratropium-albuterol (DUONEB) 0.5-2.5 (3) MG/3ML SOLN, USE 1 VIAL IN NEBULIZER 4 TIMES DAILY (Patient taking  differently: Take 3 mLs by nebulization 4 (four) times daily.), Disp: 360 mL, Rfl: 5   Lancets (ONETOUCH ULTRASOFT) lancets, USE 1 LANCET TO CHECK GLUCOSE IN THE MORNING BEFORE BREAKFAST, Disp: , Rfl: 11   losartan (COZAAR) 100 MG tablet, Take 100 mg by mouth daily., Disp: , Rfl:    meclizine (ANTIVERT) 12.5 MG tablet, Take 1 tablet (12.5 mg total) by mouth 3 (three) times daily as needed for dizziness., Disp: 20 tablet, Rfl: 0   methocarbamol (ROBAXIN) 500 MG tablet, Take 500 mg by mouth 3 (three) times daily as needed for muscle spasms., Disp: , Rfl:    metoprolol succinate (TOPROL-XL) 100 MG 24 hr tablet, Take 100 mg by mouth daily., Disp:  , Rfl:    nystatin cream (MYCOSTATIN), Apply topically 2 (two) times daily as needed., Disp: , Rfl:    pantoprazole (PROTONIX) 20 MG tablet, Take 1 tablet (20 mg total) by mouth 2 (two) times daily before a meal., Disp: 60 tablet, Rfl: 6   potassium chloride SA (K-DUR,KLOR-CON) 20 MEQ tablet, Take 1 tablet (20 mEq total) by mouth 2 (two) times daily. (Patient taking differently: Take 20 mEq by mouth 3 (three) times daily. Take 1 tablet (20 meq) scheduled three times daily & takes an additional tablet at lunch on Tuesdays, Thursdays, & Saturdays due to lasix), Disp: 30 tablet, Rfl: 3   primidone (MYSOLINE) 50 MG tablet, Take 100 mg by mouth at bedtime. , Disp: , Rfl:    spironolactone (ALDACTONE) 25 MG tablet, Take 25 mg by mouth every Monday, Wednesday, and Friday., Disp: , Rfl:    tamsulosin (FLOMAX) 0.4 MG CAPS capsule, Take 1 capsule (0.4 mg total) by mouth daily after breakfast. (Patient taking differently: Take 0.4 mg by mouth at bedtime.), Disp: 30 capsule, Rfl: 12   tiZANidine (ZANAFLEX) 4 MG tablet, Take 4 mg by mouth 3 (three) times daily as needed for muscle spasms. , Disp: , Rfl: 0   traMADol (ULTRAM) 50 MG tablet, Take 50 mg by mouth daily as needed for moderate pain. , Disp: , Rfl:    varenicline (CHANTIX) 1 MG tablet, Take 1 tablet (1 mg total) by mouth 2 (two) times daily., Disp: 60 tablet, Rfl: 3   zolpidem (AMBIEN CR) 12.5 MG CR tablet, Take 12.5 mg by mouth at bedtime., Disp: , Rfl:    Allergies: No Known Allergies  REVIEW OF SYSTEMS:   Review of Systems  Constitutional:  Negative for chills, fatigue and fever.  HENT:   Negative for lump/mass, mouth sores, nosebleeds, sore throat and trouble swallowing.   Eyes:  Negative for eye problems.  Respiratory:  Negative for cough and shortness of breath.   Cardiovascular:  Positive for chest pain (occasional). Negative for leg swelling and palpitations.  Gastrointestinal:  Positive for nausea. Negative for abdominal pain,  constipation, diarrhea and vomiting.  Genitourinary:  Negative for bladder incontinence, difficulty urinating, dysuria, frequency, hematuria and nocturia.   Musculoskeletal:  Positive for back pain (7/10 severity). Negative for arthralgias, flank pain, myalgias and neck pain.  Skin:  Negative for itching and rash.  Neurological:  Positive for dizziness. Negative for headaches and numbness.       +tingling hands and feet  Hematological:  Does not bruise/bleed easily.  Psychiatric/Behavioral:  Negative for depression, sleep disturbance and suicidal ideas. The patient is not nervous/anxious.   All other systems reviewed and are negative.    VITALS:   Blood pressure 120/78, pulse 88, temperature (!) 97.3 F (36.3 C), temperature source  Oral, resp. rate 18, height 5\' 6"  (1.676 m), weight 240 lb 9.6 oz (109.1 kg), SpO2 96%.  Wt Readings from Last 3 Encounters:  08/24/23 240 lb 9.6 oz (109.1 kg)  06/16/23 237 lb (107.5 kg)  05/26/23 237 lb 6.4 oz (107.7 kg)    Body mass index is 38.83 kg/m.  Performance status (ECOG): 1 - Symptomatic but completely ambulatory  PHYSICAL EXAM:   Physical Exam Vitals and nursing note reviewed. Exam conducted with a chaperone present.  Constitutional:      Appearance: Normal appearance.  Cardiovascular:     Rate and Rhythm: Normal rate and regular rhythm.     Pulses: Normal pulses.     Heart sounds: Normal heart sounds.  Pulmonary:     Effort: Pulmonary effort is normal.     Breath sounds: Normal breath sounds.  Abdominal:     Palpations: Abdomen is soft. There is no hepatomegaly, splenomegaly or mass.     Tenderness: There is no abdominal tenderness.  Musculoskeletal:     Right lower leg: No edema.     Left lower leg: No edema.  Lymphadenopathy:     Cervical: No cervical adenopathy.     Right cervical: No superficial, deep or posterior cervical adenopathy.    Left cervical: No superficial, deep or posterior cervical adenopathy.     Upper Body:      Right upper body: No supraclavicular or axillary adenopathy.     Left upper body: No supraclavicular or axillary adenopathy.  Neurological:     General: No focal deficit present.     Mental Status: He is alert and oriented to person, place, and time.  Psychiatric:        Mood and Affect: Mood normal.        Behavior: Behavior normal.     LABS:      Latest Ref Rng & Units 08/17/2023   12:59 PM 05/17/2023    1:09 PM 04/06/2023    8:58 AM  CBC  WBC 4.0 - 10.5 K/uL 6.7  6.0  6.1   Hemoglobin 13.0 - 17.0 g/dL 01.0  27.2  53.6   Hematocrit 39.0 - 52.0 % 34.2  37.7  39.6   Platelets 150 - 400 K/uL 366  268  326       Latest Ref Rng & Units 08/17/2023   12:59 PM 05/17/2023    1:09 PM 04/06/2023    8:58 AM  CMP  Glucose 70 - 99 mg/dL 644  034  742   BUN 8 - 23 mg/dL 9  14  14    Creatinine 0.61 - 1.24 mg/dL 5.95  6.38  7.56   Sodium 135 - 145 mmol/L 134  135  134   Potassium 3.5 - 5.1 mmol/L 4.0  4.0  4.1   Chloride 98 - 111 mmol/L 100  101  100   CO2 22 - 32 mmol/L 25  26  27    Calcium 8.9 - 10.3 mg/dL 9.9  43.3  29.5   Total Protein 6.5 - 8.1 g/dL 7.7   7.7   Total Bilirubin 0.0 - 1.2 mg/dL 0.4   0.1   Alkaline Phos 38 - 126 U/L 125   125   AST 15 - 41 U/L 19   16   ALT 0 - 44 U/L 17   17      Lab Results  Component Value Date   CEA 2.8 06/30/2013   /  CEA  Date Value Ref Range Status  06/30/2013 2.8 0.0 - 5.0 ng/mL Final   No results found for: "PSA1" No results found for: "CAN199" No results found for: "CAN125"  Lab Results  Component Value Date   TOTALPROTELP 7.6 04/06/2023   ALBUMINELP 3.3 04/06/2023   A1GS 0.3 04/06/2023   A2GS 1.2 (H) 04/06/2023   BETS 1.0 04/06/2023   GAMS 1.8 04/06/2023   MSPIKE 1.6 (H) 04/06/2023   SPEI Comment 04/06/2023   Lab Results  Component Value Date   TIBC 332 08/17/2023   TIBC 323 04/06/2023   TIBC 247 (L) 12/25/2022   FERRITIN 32 08/17/2023   FERRITIN 82 04/06/2023   FERRITIN 591 (H) 12/25/2022   IRONPCTSAT 14 (L)  08/17/2023   IRONPCTSAT 13 (L) 04/06/2023   IRONPCTSAT 6 (L) 12/25/2022   Lab Results  Component Value Date   LDH 89 (L) 12/25/2022   LDH 79 (L) 09/14/2022   LDH 85 (L) 07/17/2022     STUDIES:   No results found.

## 2023-08-26 ENCOUNTER — Inpatient Hospital Stay: Payer: Medicare HMO

## 2023-08-26 VITALS — BP 123/88 | HR 84 | Temp 97.8°F | Resp 18 | Wt 234.8 lb

## 2023-08-26 DIAGNOSIS — D509 Iron deficiency anemia, unspecified: Secondary | ICD-10-CM

## 2023-08-26 DIAGNOSIS — D472 Monoclonal gammopathy: Secondary | ICD-10-CM | POA: Diagnosis not present

## 2023-08-26 MED ORDER — METHYLPREDNISOLONE SODIUM SUCC 125 MG IJ SOLR
125.0000 mg | Freq: Once | INTRAMUSCULAR | Status: AC
  Administered 2023-08-26: 125 mg via INTRAVENOUS
  Filled 2023-08-26: qty 2

## 2023-08-26 MED ORDER — SODIUM CHLORIDE 0.9 % IV SOLN
950.0000 mg | Freq: Once | INTRAVENOUS | Status: AC
  Administered 2023-08-26: 950 mg via INTRAVENOUS
  Filled 2023-08-26: qty 19

## 2023-08-26 MED ORDER — CETIRIZINE HCL 10 MG/ML IV SOLN
10.0000 mg | Freq: Once | INTRAVENOUS | Status: AC
  Administered 2023-08-26: 10 mg via INTRAVENOUS
  Filled 2023-08-26: qty 1

## 2023-08-26 MED ORDER — IRON DEXTRAN 50 MG/ML IJ SOLN
50.0000 mg | Freq: Once | INTRAMUSCULAR | Status: AC
  Administered 2023-08-26: 50 mg via INTRAVENOUS
  Filled 2023-08-26: qty 1

## 2023-08-26 MED ORDER — SODIUM CHLORIDE 0.9 % IV SOLN
INTRAVENOUS | Status: DC
Start: 2023-08-26 — End: 2023-08-26

## 2023-08-26 MED ORDER — FAMOTIDINE IN NACL 20-0.9 MG/50ML-% IV SOLN
20.0000 mg | Freq: Once | INTRAVENOUS | Status: AC
  Administered 2023-08-26: 20 mg via INTRAVENOUS
  Filled 2023-08-26: qty 50

## 2023-08-26 MED ORDER — ACETAMINOPHEN 325 MG PO TABS
650.0000 mg | ORAL_TABLET | Freq: Once | ORAL | Status: AC
Start: 2023-08-26 — End: 2023-08-26
  Administered 2023-08-26: 650 mg via ORAL
  Filled 2023-08-26: qty 2

## 2023-08-26 NOTE — Patient Instructions (Signed)
[facility] [place] - A DEPT OF [facility]  Discharge Instructions: Thank you for choosing [facility] to provide your oncology and hematology care.  If you have a lab appointment with the Cancer Center - please note that after April 8th, 2024, all labs will be drawn in the cancer center.  You do not have to check in or register with the main entrance as you have in the past but will complete your check-in in the cancer center.  Wear comfortable clothing and clothing appropriate for easy access to any Portacath or PICC line.   We strive to give you quality time with your provider. You may need to reschedule your appointment if you arrive late (15 or more minutes).  Arriving late affects you and other patients whose appointments are after yours.  Also, if you miss three or more appointments without notifying the office, you may be dismissed from the clinic at the provider's discretion.      For prescription refill requests, have your pharmacy contact our office and allow 72 hours for refills to be completed.    Today you received Infed IV iron infusion.    BELOW ARE SYMPTOMS THAT SHOULD BE REPORTED IMMEDIATELY: *FEVER GREATER THAN 100.4 F (38 C) OR HIGHER *CHILLS OR SWEATING *NAUSEA AND VOMITING THAT IS NOT CONTROLLED WITH YOUR NAUSEA MEDICATION *UNUSUAL SHORTNESS OF BREATH *UNUSUAL BRUISING OR BLEEDING *URINARY PROBLEMS (pain or burning when urinating, or frequent urination) *BOWEL PROBLEMS (unusual diarrhea, constipation, pain near the anus) TENDERNESS IN MOUTH AND THROAT WITH OR WITHOUT PRESENCE OF ULCERS (sore throat, sores in mouth, or a toothache) UNUSUAL RASH, SWELLING OR PAIN  UNUSUAL VAGINAL DISCHARGE OR ITCHING   Items with * indicate a potential emergency and should be followed up as soon as possible or go to the Emergency Department if any problems should occur.  Please show the CHEMOTHERAPY ALERT CARD or IMMUNOTHERAPY ALERT CARD at  check-in to the Emergency Department and triage nurse.  Should you have questions after your visit or need to cancel or reschedule your appointment, please contact [facility] [place] - A DEPT OF [facility] [phone]  and follow the prompts.  Office hours are 8:00 a.m. to 4:30 p.m. Monday - Friday. Please note that voicemails left after 4:00 p.m. may not be returned until the following business day.  We are closed weekends and major holidays. You have access to a nurse at all times for urgent questions. Please call the main number to the clinic [phone] and follow the prompts.  For any non-urgent questions, you may also contact your provider using MyChart. We now offer e-Visits for anyone 42 and older to request care online for non-urgent symptoms. For details visit [email or web address].   Also download the MyChart app! Go to the app store, search "MyChart", open the app, select [facility], and log in with your MyChart username and password.

## 2023-08-26 NOTE — Progress Notes (Signed)
Infed 1,000 mg given today per MD orders. Tolerated infusion without adverse affects. Vital signs stable. No complaints at this time. Discharged from clinic ambulatory with cane in stable condition. Alert and oriented x 3. F/U with [facility] as scheduled. Patient waited 15 minutes of the 30 minute wait time per policy. RN educated patient on the importance of staying the full 30 minute wait time.

## 2023-08-26 NOTE — Progress Notes (Signed)
Patient presents today for first Infed infusion. Vital signs stable. Patient denies any significant changes since his last follow up appointment.

## 2023-09-01 ENCOUNTER — Other Ambulatory Visit: Payer: Self-pay | Admitting: Hematology

## 2023-09-01 ENCOUNTER — Inpatient Hospital Stay: Payer: Medicare HMO | Admitting: Licensed Clinical Social Worker

## 2023-09-01 ENCOUNTER — Telehealth: Payer: Self-pay | Admitting: Licensed Clinical Social Worker

## 2023-09-01 NOTE — Telephone Encounter (Signed)
CSW contacted pt for scheduled phone appt to discuss advanced directives. Call went to voicemail. CSW left contact information for pt to return call should he wish to reschedule.

## 2023-09-28 ENCOUNTER — Other Ambulatory Visit: Payer: Self-pay | Admitting: Hematology

## 2023-10-11 ENCOUNTER — Encounter (HOSPITAL_BASED_OUTPATIENT_CLINIC_OR_DEPARTMENT_OTHER): Payer: Self-pay | Admitting: Pulmonary Disease

## 2023-10-11 ENCOUNTER — Ambulatory Visit (HOSPITAL_BASED_OUTPATIENT_CLINIC_OR_DEPARTMENT_OTHER): Payer: Medicare HMO | Admitting: Pulmonary Disease

## 2023-10-11 VITALS — BP 122/80 | HR 88 | Ht 66.0 in | Wt 238.2 lb

## 2023-10-11 DIAGNOSIS — G4733 Obstructive sleep apnea (adult) (pediatric): Secondary | ICD-10-CM | POA: Diagnosis not present

## 2023-10-11 DIAGNOSIS — R0609 Other forms of dyspnea: Secondary | ICD-10-CM | POA: Diagnosis not present

## 2023-10-11 NOTE — Patient Instructions (Signed)
 X ambulatory saturation  x take Lasix daily for the next week until your cardiology appointment  You have to quit smoking completely !

## 2023-10-11 NOTE — Progress Notes (Signed)
 Subjective:    Patient ID: James Zuniga, male    DOB: [birth year], 68 y.o.   MRN: [id number]  HPI  68 yo  man from [place] for FU of OSA & COPD  former smoker quit in 2019  -CPAP 11 cm     PMH : hypertension,  chronic systolic heart failure, cardiomyopathy,  CVA with residual LT weakness diabetes type 2, gout, hyperlipidemia, phonic iron deficiency anemia, obesity  MGUS      DME :  [facility] in [place]   74-month follow-up visit  James Zuniga, a patient with a history of COPD, sleep apnea, and a recent diagnosis of a blood condition, presents for a four-month follow-up. He reports persistent chest pain and shortness of breath, stating that he becomes breathless after short distances, such as walking from one room to another in his house. He also mentions that he has an upcoming appointment with a cardiologist.  James Zuniga is currently taking fluid pills every other day for fluid build-up in his legs, which he reports is worse when his socks are too tight. He also has a history of two strokes, which have left him with weakness in his arms and legs on one side. He reports that he has been using a CPAP machine for his sleep apnea, but compliance has been inconsistent due to issues with the mask.  James Zuniga also admits to having started smoking again about six months ago, although he has reduced his intake from a pack a day to about a pack and a half per week. He expresses a desire to quit completely.  He was unable to use AirFit F30 mask that we discussed last visit.  CPAP download shows good control of events of 11 cm average usage 6.5 hours per night, few missed nights, large leak has developed over the last few weeks  PET scan 09/2022 did not show any hypermetabolic bone lesions to suggest myeloma  He does not desaturate on ambulation  Significant tests/ events reviewed  PFT 01/27/13 >> FEV1 2.50 (84%), FEV1% 83, TLC 4.06 (61%), DLCO 60%, no BD PFT 04/02/21 >> FEV1  2.19 (78%), FEV1% 77, TLC 5.34 (80%), DLCO 76% PSG 04/21/19 >> AHI 15.5, SpO2 low 83% CPAP titration 05/17/19 >> CPAP 11 cm H2O CT chest 03/02/13 >> mild paraseptal emphysema, 4 mm RUL nodule CT chest 07/26/15 >> stable RUL nodule  Review of Systems neg for any significant sore throat, dysphagia, itching, sneezing, nasal congestion or excess/ purulent secretions, fever, chills, sweats, unintended wt loss, pleuritic or exertional cp, hempoptysis, orthopnea pnd or change in chronic leg swelling. Also denies presyncope, palpitations, heartburn, abdominal pain, nausea, vomiting, diarrhea or change in bowel or urinary habits, dysuria,hematuria, rash, arthralgias, visual complaints, headache, numbness weakness or ataxia.     Objective:   Physical Exam  Gen. Pleasant, obese, in no distress ENT - no lesions, no post nasal drip Neck: No JVD, no thyromegaly, no carotid bruits Lungs: no use of accessory muscles, no dullness to percussion, decreased without rales or rhonchi  Cardiovascular: Rhythm regular, heart sounds  normal, no murmurs or gallops, no peripheral edema Musculoskeletal: No deformities, no cyanosis or clubbing , no tremors       Assessment & Plan:   Chronic Obstructive Pulmonary Disease (COPD) COPD is well-managed with Breo and albuterol inhalers, but there is worsening dyspnea likely due to resumed smoking or potential heart failure. Smoking cessation is crucial to prevent exacerbation. - Continue Breo and albuterol inhalers. - Encourage smoking  cessation.  Obstructive Sleep Apnea CPAP compliance has improved, but mask fit issues are causing increased leakage. He has not used the new mask due to procurement issues. Proper mask fit is essential for effective treatment. - Ensure proper CPAP mask fit and replace straps if necessary. - Encourage consistent use of CPAP.  Heart Failure Experiencing dyspnea and fluid retention, suggestive of heart failure. Currently on Lasix every  other day, but will increase to daily use until cardiology appointment. This may reduce fluid overload and improve breathing. - Increase Lasix to daily use until the cardiology appointment on April 6. - Evaluate fluid status and dyspnea after cardiology consultation.  Stroke History of two strokes resulting in unilateral weakness and foot dragging. He declined brace use.  May have some element of deconditioning contributing to dyspnea

## 2023-10-21 ENCOUNTER — Other Ambulatory Visit: Payer: Self-pay | Admitting: Gastroenterology

## 2023-10-24 ENCOUNTER — Other Ambulatory Visit: Payer: Self-pay | Admitting: Hematology

## 2023-10-25 ENCOUNTER — Encounter: Payer: Self-pay | Admitting: Hematology

## 2023-11-22 ENCOUNTER — Encounter: Payer: Self-pay | Admitting: Hematology

## 2023-12-13 ENCOUNTER — Telehealth: Payer: Self-pay | Admitting: *Deleted

## 2023-12-13 ENCOUNTER — Encounter: Payer: Self-pay | Admitting: Hematology

## 2023-12-13 ENCOUNTER — Inpatient Hospital Stay: Attending: Hematology

## 2023-12-13 DIAGNOSIS — D509 Iron deficiency anemia, unspecified: Secondary | ICD-10-CM | POA: Diagnosis present

## 2023-12-13 DIAGNOSIS — D472 Monoclonal gammopathy: Secondary | ICD-10-CM

## 2023-12-13 LAB — CBC WITH DIFFERENTIAL/PLATELET
Abs Immature Granulocytes: 0.01 10*3/uL (ref 0.00–0.07)
Basophils Absolute: 0 10*3/uL (ref 0.0–0.1)
Basophils Relative: 1 %
Eosinophils Absolute: 0.2 10*3/uL (ref 0.0–0.5)
Eosinophils Relative: 3 %
HCT: 31.3 % — ABNORMAL LOW (ref 39.0–52.0)
Hemoglobin: 9.2 g/dL — ABNORMAL LOW (ref 13.0–17.0)
Immature Granulocytes: 0 %
Lymphocytes Relative: 21 %
Lymphs Abs: 1.2 10*3/uL (ref 0.7–4.0)
MCH: 22.8 pg — ABNORMAL LOW (ref 26.0–34.0)
MCHC: 29.4 g/dL — ABNORMAL LOW (ref 30.0–36.0)
MCV: 77.5 fL — ABNORMAL LOW (ref 80.0–100.0)
Monocytes Absolute: 0.3 10*3/uL (ref 0.1–1.0)
Monocytes Relative: 5 %
Neutro Abs: 4 10*3/uL (ref 1.7–7.7)
Neutrophils Relative %: 70 %
Platelets: 409 10*3/uL — ABNORMAL HIGH (ref 150–400)
RBC: 4.04 MIL/uL — ABNORMAL LOW (ref 4.22–5.81)
RDW: 17.1 % — ABNORMAL HIGH (ref 11.5–15.5)
WBC: 5.7 10*3/uL (ref 4.0–10.5)
nRBC: 0 % (ref 0.0–0.2)

## 2023-12-13 LAB — COMPREHENSIVE METABOLIC PANEL WITH GFR
ALT: 15 U/L (ref 0–44)
AST: 18 U/L (ref 15–41)
Albumin: 3.2 g/dL — ABNORMAL LOW (ref 3.5–5.0)
Alkaline Phosphatase: 119 U/L (ref 38–126)
Anion gap: 10 (ref 5–15)
BUN: 11 mg/dL (ref 8–23)
CO2: 24 mmol/L (ref 22–32)
Calcium: 9.8 mg/dL (ref 8.9–10.3)
Chloride: 101 mmol/L (ref 98–111)
Creatinine, Ser: 1.31 mg/dL — ABNORMAL HIGH (ref 0.61–1.24)
GFR, Estimated: 59 mL/min — ABNORMAL LOW
Glucose, Bld: 184 mg/dL — ABNORMAL HIGH (ref 70–99)
Potassium: 3.9 mmol/L (ref 3.5–5.1)
Sodium: 135 mmol/L (ref 135–145)
Total Bilirubin: 0.4 mg/dL (ref 0.0–1.2)
Total Protein: 8.2 g/dL — ABNORMAL HIGH (ref 6.5–8.1)

## 2023-12-13 LAB — IRON AND TIBC
Iron: 24 ug/dL — ABNORMAL LOW (ref 45–182)
Saturation Ratios: 6 % — ABNORMAL LOW (ref 17.9–39.5)
TIBC: 375 ug/dL (ref 250–450)
UIBC: 351 ug/dL

## 2023-12-13 LAB — FERRITIN: Ferritin: 9 ng/mL — ABNORMAL LOW (ref 24–336)

## 2023-12-13 NOTE — Telephone Encounter (Signed)
 Wife called stating that patient is experiencing profound fatigue.  Labs were scheduled for 6/11.  Will do today and review with Dr. Cheree Cords.

## 2023-12-14 LAB — KAPPA/LAMBDA LIGHT CHAINS
Kappa free light chain: 22.5 mg/L — ABNORMAL HIGH (ref 3.3–19.4)
Kappa, lambda light chain ratio: 1.03 (ref 0.26–1.65)
Lambda free light chains: 21.8 mg/L (ref 5.7–26.3)

## 2023-12-14 NOTE — Telephone Encounter (Signed)
 Per Dr. Katragadda, will schedule for Feraheme  510 mg x 2 doses based on results.

## 2023-12-15 LAB — PROTEIN ELECTROPHORESIS, SERUM
A/G Ratio: 0.7 (ref 0.7–1.7)
Albumin ELP: 3.1 g/dL (ref 2.9–4.4)
Alpha-1-Globulin: 0.3 g/dL (ref 0.0–0.4)
Alpha-2-Globulin: 1.1 g/dL — ABNORMAL HIGH (ref 0.4–1.0)
Beta Globulin: 1 g/dL (ref 0.7–1.3)
Gamma Globulin: 1.8 g/dL (ref 0.4–1.8)
Globulin, Total: 4.2 g/dL — ABNORMAL HIGH (ref 2.2–3.9)
M-Spike, %: 1.4 g/dL — ABNORMAL HIGH
Total Protein ELP: 7.3 g/dL (ref 6.0–8.5)

## 2023-12-20 ENCOUNTER — Inpatient Hospital Stay

## 2023-12-20 VITALS — BP 106/77 | HR 78 | Temp 97.5°F | Resp 18 | Wt 234.6 lb

## 2023-12-20 DIAGNOSIS — D509 Iron deficiency anemia, unspecified: Secondary | ICD-10-CM

## 2023-12-20 MED ORDER — SODIUM CHLORIDE 0.9 % IV SOLN: INTRAVENOUS | Status: DC

## 2023-12-20 MED ORDER — ACETAMINOPHEN 325 MG PO TABS
650.0000 mg | ORAL_TABLET | Freq: Once | ORAL | Status: AC
  Administered 2023-12-20: 650 mg via ORAL
  Filled 2023-12-20: qty 2

## 2023-12-20 MED ORDER — CETIRIZINE HCL 10 MG PO TABS
10.0000 mg | ORAL_TABLET | Freq: Once | ORAL | Status: AC
  Administered 2023-12-20: 10 mg via ORAL
  Filled 2023-12-20: qty 1

## 2023-12-20 MED ORDER — SODIUM CHLORIDE 0.9 % IV SOLN
510.0000 mg | Freq: Once | INTRAVENOUS | Status: AC
  Administered 2023-12-20: 510 mg via INTRAVENOUS
  Filled 2023-12-20: qty 510

## 2023-12-20 NOTE — Patient Instructions (Signed)

## 2023-12-20 NOTE — Progress Notes (Signed)
 Patient tolerated iron  infusion with no complaints voiced.  Peripheral IV site clean and dry with good blood return noted before and after infusion.  Band aid applied. Pt did not want to wait the 30 minutes post iron  observation. Rn educated pt on the importance of notifying the clinic if any complications occur, pt verbalized understanding. VSS with discharge and left in satisfactory condition with no s/s of distress noted. All follow ups as scheduled.   James Zuniga [employer]

## 2023-12-22 ENCOUNTER — Inpatient Hospital Stay: Payer: Medicare HMO

## 2023-12-22 ENCOUNTER — Other Ambulatory Visit

## 2024-01-04 ENCOUNTER — Inpatient Hospital Stay (HOSPITAL_BASED_OUTPATIENT_CLINIC_OR_DEPARTMENT_OTHER): Payer: Medicare HMO | Admitting: Hematology

## 2024-01-04 ENCOUNTER — Inpatient Hospital Stay

## 2024-01-04 VITALS — BP 116/84 | HR 88 | Temp 98.6°F | Resp 18 | Wt 231.9 lb

## 2024-01-04 VITALS — BP 112/80 | HR 73 | Resp 19

## 2024-01-04 DIAGNOSIS — D509 Iron deficiency anemia, unspecified: Secondary | ICD-10-CM | POA: Diagnosis not present

## 2024-01-04 MED ORDER — CETIRIZINE HCL 10 MG/ML IV SOLN
10.0000 mg | Freq: Once | INTRAVENOUS | Status: AC
  Administered 2024-01-04: 10 mg via INTRAVENOUS
  Filled 2024-01-04: qty 1

## 2024-01-04 MED ORDER — SODIUM CHLORIDE 0.9 % IV SOLN
1000.0000 mg | Freq: Once | INTRAVENOUS | Status: AC
  Administered 2024-01-04: 1000 mg via INTRAVENOUS
  Filled 2024-01-04: qty 20

## 2024-01-04 MED ORDER — DIPHENHYDRAMINE HCL 25 MG PO CAPS: 50.0000 mg | ORAL_CAPSULE | Freq: Once | ORAL | Status: DC

## 2024-01-04 MED ORDER — SODIUM CHLORIDE 0.9 % IV SOLN: INTRAVENOUS | Status: DC

## 2024-01-04 MED ORDER — METHYLPREDNISOLONE SODIUM SUCC 125 MG IJ SOLR
125.0000 mg | Freq: Once | INTRAMUSCULAR | Status: AC
  Administered 2024-01-04: 125 mg via INTRAVENOUS
  Filled 2024-01-04: qty 2

## 2024-01-04 MED ORDER — FAMOTIDINE IN NACL 20-0.9 MG/50ML-% IV SOLN
20.0000 mg | Freq: Once | INTRAVENOUS | Status: AC
  Administered 2024-01-04: 20 mg via INTRAVENOUS
  Filled 2024-01-04: qty 50

## 2024-01-04 MED ORDER — IRON DEXTRAN 50 MG/ML IJ SOLN: 50.0000 mg | Freq: Once | INTRAMUSCULAR | Status: DC

## 2024-01-04 MED ORDER — ACETAMINOPHEN 325 MG PO TABS
650.0000 mg | ORAL_TABLET | Freq: Once | ORAL | Status: AC
  Administered 2024-01-04: 650 mg via ORAL
  Filled 2024-01-04: qty 2

## 2024-01-04 NOTE — Patient Instructions (Addendum)
 [facility]  Discharge Instructions  You were seen and examined today by Dr. Rogers.  Dr. Rogers discussed your most recent lab work and it shows that your iron  is low. Your myeloma numbers look good and stable.  You will get your IV iron  infusion today.   Follow-up as scheduled in 3 months.    Thank you for choosing [facility] to provide your oncology and hematology care.   To afford each patient quality time with our provider, please arrive at least 15 minutes before your scheduled appointment time. You may need to reschedule your appointment if you arrive late (10 or more minutes). Arriving late affects you and other patients whose appointments are after yours.  Also, if you miss three or more appointments without notifying the office, you may be dismissed from the clinic at the provider's discretion.    Again, thank you for choosing [facility].  Our hope is that these requests will decrease the amount of time that you wait before being seen by our physicians.   If you have a lab appointment with the Cancer Center - please note that after April 8th, all labs will be drawn in the cancer center.  You do not have to check in or register with the main entrance as you have in the past but will complete your check-in at the cancer center.            _____________________________________________________________  Should you have questions after your visit to [facility], please contact our office at [phone] and follow the prompts.  Our office hours are 8:00 a.m. to 4:30 p.m. Monday - Thursday and 8:00 a.m. to 2:30 p.m. Friday.  Please note that voicemails left after 4:00 p.m. may not be returned until the following business day.  We are closed weekends and all major holidays.  You do have access to a nurse 24-7, just call the main number to the clinic [phone] and do not press any options, hold on  the line and a nurse will answer the phone.    For prescription refill requests, have your pharmacy contact our office and allow 72 hours.    Masks are no longer required in the cancer centers. If you would like for your care team to wear a mask while they are taking care of you, please let them know. You may have one support person who is at least 69 years old accompany you for your appointments.

## 2024-01-04 NOTE — Progress Notes (Signed)
 Patient tolerated iron infusion with no complaints voiced.  Peripheral IV site clean and dry with good blood return noted before and after infusion.  Band aid applied. Pt observed for 30 minutes post iron infusion without any complications.  VSS with discharge and left in satisfactory condition with no s/s of distress noted. All follow ups as scheduled.   James Zuniga [employer]

## 2024-01-04 NOTE — Progress Notes (Signed)
 MD changed to Infed  1000 mg x 1 for today.  No test dose needed.  Last dose was 08/26/23 and tolerated well.  Niels Molt, PharmD

## 2024-01-04 NOTE — Patient Instructions (Signed)
 Iron Dextran Injection What is this medication? IRON DEXTRAN (EYE ern DEX tran) treats low levels of iron in your body. Iron is a mineral that plays an important role in making red blood cells, which carry oxygen from your lungs to the rest of your body. This medicine may be used for other purposes; ask your health care provider or pharmacist if you have questions. COMMON BRAND NAME(S): Dexferrum, INFeD What should I tell my care team before I take this medication? They need to know if you have any of these conditions: Anemia not caused by low iron levels Heart disease High levels of iron in the blood Kidney disease Liver disease An unusual or allergic reaction to iron, other medications, foods, dyes, or preservatives Pregnant or trying to get pregnant Breastfeeding How should I use this medication? This medication is injected into a vein or a muscle. It is given by your care team in a hospital or clinic setting. Talk to your care team about the use of this medication in children. While it may be prescribed for children as young as 4 months for selected conditions, precautions do apply. Overdosage: If you think you have taken too much of this medicine contact a poison control center or emergency room at once. NOTE: This medicine is only for you. Do not share this medicine with others. What if I miss a dose? Keep appointments for follow-up doses. It is important not to miss your dose. Call your care team if you are unable to keep an appointment. What may interact with this medication? Do not take this medication with any of the following: Deferoxamine Dimercaprol Other iron products This medication may also interact with the following: Chloramphenicol Deferasirox This list may not describe all possible interactions. Give your health care provider a list of all the medicines, herbs, non-prescription drugs, or dietary supplements you use. Also tell them if you smoke, drink alcohol, or use  illegal drugs. Some items may interact with your medicine. What should I watch for while using this medication? Visit your care team for regular checks on your progress. Tell your care team if your symptoms do not start to get better or if they get worse. You may need blood work while taking this medication. You may need to eat more foods that contain iron. Talk to your care team. Foods that contain iron include whole grains/cereals, dried fruits, beans, peas, leafy green vegetables, and organ meats (liver, kidney). Long-term use of this medication may increase your risk of some cancers. Talk to your care team about your risk of cancer. What side effects may I notice from receiving this medication? Side effects that you should report to your care team as soon as possible: Allergic reactions--skin rash, itching, hives, swelling of the face, lips, tongue, or throat Low blood pressure--dizziness, feeling faint or lightheaded, blurry vision Shortness of breath Side effects that usually do not require medical attention (report to your care team if they continue or are bothersome): Flushing Headache Joint pain Muscle pain Nausea Pain, redness, or irritation at injection site This list may not describe all possible side effects. Call your doctor for medical advice about side effects. You may report side effects to FDA at 1-800-FDA-1088. Where should I keep my medication? This medication is given in a hospital or clinic. It will not be stored at home. NOTE: This sheet is a summary. It may not cover all possible information. If you have questions about this medicine, talk to your doctor, pharmacist, or health  care provider.  2024 Elsevier/Gold Standard (2023-02-17 00:00:00)

## 2024-01-04 NOTE — Progress Notes (Signed)
 [facility] [address], [place] [address]    Clinic Day:  01/04/24   Referring physician: Joshua Santana CROME, NP  Patient Care Team: Joshua Santana CROME, NP as PCP - General (Nurse Practitioner) Dyane Rush, MD (Inactive) as Consulting Physician (Gastroenterology) Rogers Hai, MD as Medical Oncologist (Hematology)   ASSESSMENT & PLAN:   Assessment: 1.  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 - Colonoscopy (08/26/2017) normal. - Small bowel endoscopy (05/24/2018): 1 gastric polyp, 1 nonbleeding angiectasia in duodenum treated with APC.  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  Normal duodenum, first second and third parts. - He was receiving intermittent Venofer  infusions, last 1 on 09/08/2021. - He was also receiving B12 injections, last 1 on 05/25/2022. - BMBX (10/06/2022): Hypercellular marrow (60%) involved by plasma cell neoplasm, 9% plasma cells by manual aspirate differential and 10% by CD138 IHC.  No evidence of morphologic dysplasia.  No ring sideroblasts. - Myeloma FISH panel: t(11;14) consistent with standard risk.  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  Social/family history: - Lives with wife at home.  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  Quit smoking 5 months ago.  Smoked 1/3 pack/day for 50 years. - Multiple maternal aunts and uncles had cancers.  Patient does not know the types.    Plan: 1.  IgG lambda smoldering multiple myeloma: - No recurrent infections. - MM labs from 12/13/23 stable. Skeletal survey 2/25 with no lytic lesions. - No 'CRAB' features. Will repeat MM labs in 6 months.   2.  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  Denies BRBPR/melena.  - Infed  on 08/26/23.received feraheme  on 12/20/23 with very minimal improvement. He's on ASA and Plavix . Will schedule for Infed  1g. Rtc 3 months  with labs.    Orders Placed This Encounter  Procedures   CBC with Differential/Platelet    Standing Status:   Future    Expected Date:   04/05/2024    Expiration Date:   07/04/2024    Release to patient:   Immediate   Ferritin    Standing Status:   Future    Expected Date:   04/05/2024    Expiration Date:   07/04/2024    Release to patient:   Immediate   Iron  and TIBC    Standing Status:   Future    Expected Date:   04/05/2024    Expiration Date:   07/04/2024    Release to patient:   Immediate   Ambulatory referral to Social Work    Referral Priority:   Routine    Referral Type:   Consultation    Referral Reason:   Specialty Services Required    Number of Visits Requested:   1     I,Helena R Teague,acting as a Neurosurgeon for Hai Rogers, MD.,have documented all relevant documentation on the behalf of Hai Rogers, MD,as directed by  Hai Rogers, MD while in the presence of Hai Rogers, MD.  I, Hai Rogers MD, have reviewed the above documentation for accuracy and completeness, and I agree with the above.      Hai Rogers, MD   6/24/20259:19 AM  CHIEF COMPLAINT:   Diagnosis: Smoldering myeloma; Normocytic anemia    Cancer Staging  No matching staging information was found for the patient.    Prior Therapy: Venofer  and B12 injections  Current Therapy:  Feraheme     HISTORY OF PRESENT ILLNESS:   Oncology History  Chronic iron  deficiency anemia     INTERVAL HISTORY:   James Zuniga is a 68 y.o. male presenting to clinic today for follow up of Smoldering myeloma and normocytic anemia. He was last seen by me on 08/24/23.  Today, he states that he is doing well overall. His appetite level is at 75%. His energy level is at 0%. James Zuniga is accompanied by his wife.  He reports tiredness. He denies and blood per rectum or melena.    After his last iron  infusion on 12/20/23, he notes improvement in energy levels for 7 days. Energy levels  have since worsened. James Zuniga is currently taking aspirin  and Plavix .   PAST MEDICAL HISTORY:   Past Medical History: Past Medical History:  Diagnosis Date   Arthritis    all over (08/24/2017)   CHF (congestive heart failure) (HCC)    Chronic airway obstruction (HCC)    Chronic lower back pain    Coronary artery disease    Disorder of liver    spots on my liver; they've rechecked them; they haven't changed (08/24/2017)   Dyspnea    Esophageal reflux    Gout    on daily RX (08/24/2017)   Hyperlipidemia    Hypertension    Hypokalemia 11/2016   Hypotestosteronism 07/01/2013   Insomnia    Iron  deficiency anemia, unspecified 07/01/2013   Memory loss    On home oxygen  therapy    2L prn (08/24/2017)   OSA on CPAP    PVD (peripheral vascular disease) (HCC)    Renal disorder    Stroke (HCC) 01/2017   left sided weakness remains (08/24/2017)   Type II diabetes mellitus (HCC)    Vitamin D  deficiency     Surgical History: Past Surgical History:  Procedure Laterality Date   BIOPSY  11/06/2022   Procedure: BIOPSY;  Surgeon: Shaaron Lamar HERO, MD;  [facility];  Service: Endoscopy;;   BIOPSY  04/01/2023   Procedure: BIOPSY;  Surgeon: Wilhelmenia Aloha Raddle., MD;  Location: [facility];  Service: Gastroenterology;;   COLONOSCOPY N/A 08/26/2017   Procedure: COLONOSCOPY;  Surgeon: Avram Lupita BRAVO, MD;  Location: [facility];  Service: Endoscopy;  Laterality: N/A;   COLONOSCOPY WITH PROPOFOL  N/A 11/06/2022   Procedure: COLONOSCOPY WITH PROPOFOL ;  Surgeon: Shaaron Lamar HERO, MD;  [facility];  Service: Endoscopy;  Laterality: N/A;  1:15 PM   ENTEROSCOPY N/A 05/24/2018   Procedure: ENTEROSCOPY;  Surgeon: San Sandor GAILS, DO;  Location: [facility];  Service: Gastroenterology;  Laterality: N/A;   ESOPHAGOGASTRODUODENOSCOPY N/A 08/26/2017   Procedure: ESOPHAGOGASTRODUODENOSCOPY (EGD);  Surgeon: Avram Lupita BRAVO, MD;  Location: [facility] ENDOSCOPY;  Service: Endoscopy;   Laterality: N/A;   ESOPHAGOGASTRODUODENOSCOPY (EGD) WITH PROPOFOL  N/A 11/06/2022   Procedure: ESOPHAGOGASTRODUODENOSCOPY (EGD) WITH PROPOFOL ;  Surgeon: Shaaron Lamar HERO, MD;  [facility];  Service: Endoscopy;  Laterality: N/A;   ESOPHAGOGASTRODUODENOSCOPY (EGD) WITH PROPOFOL  N/A 04/01/2023   Procedure: ESOPHAGOGASTRODUODENOSCOPY (EGD) WITH PROPOFOL ;  Surgeon: Wilhelmenia Aloha Raddle., MD;  Location: [facility];  Service: Gastroenterology;  Laterality: N/A;   EUS N/A 04/01/2023   Procedure: UPPER ENDOSCOPIC ULTRASOUND (EUS) RADIAL;  Surgeon: Wilhelmenia Aloha Raddle., MD;  Location: [facility];  Service: Gastroenterology;  Laterality: N/A;   HEMOSTASIS CLIP PLACEMENT  04/01/2023   Procedure: HEMOSTASIS CLIP PLACEMENT;  Surgeon: Wilhelmenia Aloha Raddle., MD;  Location: [facility];  Service: Gastroenterology;;   HOT HEMOSTASIS N/A 05/24/2018  Procedure: HOT HEMOSTASIS (ARGON PLASMA COAGULATION/BICAP);  Surgeon: San Sandor GAILS, DO;  Location: [facility];  Service: Gastroenterology;  Laterality: N/A;   KNEE ARTHROSCOPY Left    LOOP RECORDER IMPLANT  06/2017   POLYPECTOMY  05/24/2018   Procedure: POLYPECTOMY;  Surgeon: San Sandor GAILS, DO;  Location: [facility];  Service: Gastroenterology;;   POLYPECTOMY  11/06/2022   Procedure: POLYPECTOMY;  Surgeon: Shaaron Lamar HERO, MD;  [facility];  Service: Endoscopy;;   POLYPECTOMY  04/01/2023   Procedure: POLYPECTOMY;  Surgeon: Wilhelmenia Aloha Raddle., MD;  Location: [facility];  Service: Gastroenterology;;   TEE WITHOUT CARDIOVERSION N/A 12/24/2016   Procedure: TRANSESOPHAGEAL ECHOCARDIOGRAM (TEE);  Surgeon: Okey Vina GAILS, MD;  Location: [facility] ENDOSCOPY;  Service: Cardiovascular;  Laterality: N/A;    Social History: Social History   Socioeconomic History   Marital status: Married    Spouse name: Not on file   Number of children: Not on file   Years of education: Not on file   Highest education level: Not on file   Occupational History   Occupation: diabled    Comment: as of early 1990s  Tobacco Use   Smoking status: Every Day    Current packs/day: 1.50    Average packs/day: 0.5 packs/day for 34.8 years (18.2 ttl pk-yrs)    Types: Cigarettes    Start date: 08/05/1983    Last attempt to quit: 08/15/2017   Smokeless tobacco: Never  Vaping Use   Vaping status: Never Used  Substance and Sexual Activity   Alcohol use: No    Alcohol/week: 0.0 standard drinks of alcohol   Drug use: No   Sexual activity: Not Currently  Other Topics Concern   Not on file  Social History Narrative   Not on file   Social Drivers of Health   Financial Resource Strain: Not on file  Food Insecurity: Low Risk  (03/02/2023)   Received from [facility]   Hunger Vital Sign    Within the past 12 months, you worried that your food would run out before you got money to buy more: Never true    Within the past 12 months, the food you bought just didn't last and you didn't have money to get more. : Never true  Transportation Needs: No Transportation Needs (07/17/2022)   PRAPARE - Administrator, Civil Service (Medical): No    Lack of Transportation (Non-Medical): No  Physical Activity: Not on file  Stress: Not on file  Social Connections: Not on file  Intimate Partner Violence: Not At Risk (07/17/2022)   Humiliation, Afraid, Rape, and Kick questionnaire    Fear of Current or Ex-Partner: No    Emotionally Abused: No    Physically Abused: No    Sexually Abused: No    Family History: Family History  Problem Relation Age of Onset   Heart disease Mother    Diabetes Father    Stroke Brother    Stroke Paternal Uncle    Diabetes Sister    Heart attack Brother    Heart attack Brother     Current Medications:  Current Outpatient Medications:    albuterol  (VENTOLIN  HFA) 108 (90 Base) MCG/ACT inhaler, Inhale 2 puffs into the lungs every 6 (six) hours as needed for wheezing or shortness of breath., Disp: 18 g,  Rfl: 3   allopurinol  (ZYLOPRIM ) 300 MG tablet, Take 300 mg by mouth daily., Disp: , Rfl:    amLODipine (NORVASC) 5 MG tablet, Take 1 tablet by mouth  daily., Disp: , Rfl:    aspirin  EC 81 MG tablet, Take 81 mg by mouth daily. Swallow whole., Disp: , Rfl:    atorvastatin  (LIPITOR ) 80 MG tablet, Take 1 tablet (80 mg total) by mouth every morning., Disp: 30 tablet, Rfl: 0   Blood Glucose Monitoring Suppl (FIFTY50 GLUCOSE METER 2.0) w/Device KIT, To check blood sugars TID or Use as instructed Include strips. Lancets, lancet device, control solution. batteries, Disp: , Rfl:    clopidogrel  (PLAVIX ) 75 MG tablet, Take 1 tablet (75 mg total) by mouth daily., Disp: , Rfl:    colchicine 0.6 MG tablet, Take 0.6 mg by mouth daily., Disp: , Rfl:    Continuous Glucose Sensor (FREESTYLE LIBRE 3 SENSOR) MISC, , Disp: , Rfl:    Cyanocobalamin  (VITAMIN B-12 IJ), Inject 1,000 mg as directed See admin instructions. Every 30 days as needed, Disp: , Rfl:    diclofenac  Sodium (VOLTAREN ) 1 % GEL, Apply 2 g topically daily as needed (pain)., Disp: , Rfl:    DULoxetine  (CYMBALTA ) 60 MG capsule, Take 60 mg by mouth 2 (two) times daily. , Disp: , Rfl:    fluticasone  (FLONASE ) 50 MCG/ACT nasal spray, Place 1 spray into both nostrils daily., Disp: 16 g, Rfl: 2   fluticasone  furoate-vilanterol (BREO ELLIPTA ) 200-25 MCG/ACT AEPB, Inhale 1 puff into the lungs daily., Disp: 1 each, Rfl: 5   furosemide  (LASIX ) 20 MG tablet, Take 20 mg by mouth every Tuesday, Thursday, and Saturday at 6 PM. , Disp: , Rfl:    gabapentin  (NEURONTIN ) 300 MG capsule, Take 600 mg by mouth 3 (three) times daily., Disp: , Rfl:    glucose blood test strip, 1 each by Misc.(Non-Drug; Combo Route) route daily., Disp: , Rfl:    HUMULIN R  100 UNIT/ML injection, Use as sliding scale up to 10 units tid with meals, Disp: , Rfl:    Insulin  Glargine (TOUJEO  SOLOSTAR [place]), Inject 70 Units into the skin daily with supper., Disp: , Rfl:    insulin  NPH-regular Human  (NOVOLIN 70/30) (70-30) 100 UNIT/ML injection, Please inject 25 units twice daily before breakfast and dinner. (Patient taking differently: Inject 25 Units into the skin 2 (two) times daily with a meal. Sliding scale), Disp: 10 mL, Rfl: 3   Insulin  Pen Needle (FIFTY50 PEN NEEDLES) 32G X 4 MM MISC, 1 each by Misc.(Non-Drug; Combo Route) route daily., Disp: , Rfl:    ipratropium-albuterol  (DUONEB) 0.5-2.5 (3) MG/3ML SOLN, USE 1 VIAL IN NEBULIZER 4 TIMES DAILY (Patient taking differently: Take 3 mLs by nebulization 4 (four) times daily.), Disp: 360 mL, Rfl: 5   Lancets (ONETOUCH ULTRASOFT) lancets, USE 1 LANCET TO CHECK GLUCOSE IN THE MORNING BEFORE BREAKFAST, Disp: , Rfl: 11   losartan  (COZAAR ) 100 MG tablet, Take 100 mg by mouth daily., Disp: , Rfl:    meclizine  (ANTIVERT ) 12.5 MG tablet, Take 1 tablet (12.5 mg total) by mouth 3 (three) times daily as needed for dizziness., Disp: 20 tablet, Rfl: 0   methocarbamol (ROBAXIN) 500 MG tablet, Take 500 mg by mouth 3 (three) times daily as needed for muscle spasms., Disp: , Rfl:    metoprolol  succinate (TOPROL -XL) 100 MG 24 hr tablet, Take 100 mg by mouth daily., Disp: , Rfl:    nystatin cream (MYCOSTATIN), Apply topically 2 (two) times daily as needed., Disp: , Rfl:    OZEMPIC, 0.25 OR 0.5 MG/DOSE, 2 MG/3ML SOPN, Inject 0.25 mg as directed once a week., Disp: , Rfl:    pantoprazole  (PROTONIX ) 20 MG tablet,  TAKE 1 TABLET BY MOUTH TWICE DAILY BEFORE A MEAL, Disp: 60 tablet, Rfl: 0   potassium chloride  SA (K-DUR,KLOR-CON ) 20 MEQ tablet, Take 1 tablet (20 mEq total) by mouth 2 (two) times daily. (Patient taking differently: Take 20 mEq by mouth 3 (three) times daily. Take 1 tablet (20 meq) scheduled three times daily & takes an additional tablet at lunch on Tuesdays, Thursdays, & Saturdays due to lasix ), Disp: 30 tablet, Rfl: 3   primidone  (MYSOLINE ) 50 MG tablet, Take 100 mg by mouth 2 (two) times daily., Disp: , Rfl:    spironolactone (ALDACTONE) 25 MG tablet,  Take 25 mg by mouth every Monday, Wednesday, and Friday., Disp: , Rfl:    tamsulosin  (FLOMAX ) 0.4 MG CAPS capsule, Take 1 capsule (0.4 mg total) by mouth daily after breakfast. (Patient taking differently: Take 0.4 mg by mouth at bedtime.), Disp: 30 capsule, Rfl: 12   tiZANidine  (ZANAFLEX ) 4 MG tablet, Take 4 mg by mouth 3 (three) times daily as needed for muscle spasms. , Disp: , Rfl: 0   traMADol  (ULTRAM ) 50 MG tablet, Take 50 mg by mouth daily as needed for moderate pain. , Disp: , Rfl:    varenicline  (CHANTIX ) 1 MG tablet, Take 1 tablet by mouth twice daily, Disp: 60 tablet, Rfl: 0   zolpidem  (AMBIEN  CR) 12.5 MG CR tablet, Take 12.5 mg by mouth at bedtime., Disp: , Rfl:  No current facility-administered medications for this visit.  Facility-Administered Medications Ordered in Other Visits:    0.9 %  sodium chloride  infusion, , Intravenous, Continuous, Rogers Hai, MD, Last Rate: 10 mL/hr at 01/04/24 0905, New Bag at 01/04/24 0905   famotidine  (PEPCID ) IVPB 20 mg premix, 20 mg, Intravenous, Once, Rogers Hai, MD, Last Rate: 200 mL/hr at 01/04/24 0912, 20 mg at 01/04/24 9087   iron  dextran complex (INFED ) 50 MG/ML 1,000 mg in sodium chloride  0.9 % 500 mL IVPB, 1,000 mg, Intravenous, Once, Rogers Hai, MD   Allergies: No Known Allergies  REVIEW OF SYSTEMS:   Review of Systems  Constitutional:  Positive for fatigue. Negative for chills and fever.  HENT:   Negative for lump/mass, mouth sores, nosebleeds, sore throat and trouble swallowing.   Eyes:  Negative for eye problems.  Respiratory:  Positive for cough and shortness of breath.   Cardiovascular:  Positive for chest pain. Negative for leg swelling and palpitations.  Gastrointestinal:  Negative for abdominal pain, constipation, diarrhea, nausea and vomiting.  Genitourinary:  Negative for bladder incontinence, difficulty urinating, dysuria, frequency, hematuria and nocturia.   Musculoskeletal:  Positive for back  pain (6/10 severity). Negative for arthralgias, flank pain, myalgias and neck pain.  Skin:  Negative for itching and rash.  Neurological:  Positive for dizziness and numbness (and tingling in hands and feet). Negative for headaches.  Hematological:  Does not bruise/bleed easily.  Psychiatric/Behavioral:  Negative for depression, sleep disturbance and suicidal ideas. The patient is not nervous/anxious.   All other systems reviewed and are negative.    VITALS:   Blood pressure 116/84, pulse 88, temperature 98.6 F (37 C), temperature source Oral, resp. rate 18, weight 231 lb 14.8 oz (105.2 kg), SpO2 100%.  Wt Readings from Last 3 Encounters:  01/04/24 231 lb 14.8 oz (105.2 kg)  12/20/23 234 lb 9.6 oz (106.4 kg)  10/11/23 238 lb 3.2 oz (108 kg)    Body mass index is 37.43 kg/m.  Performance status (ECOG): 1 - Symptomatic but completely ambulatory  PHYSICAL EXAM:   Physical Exam Vitals and  nursing note reviewed. Exam conducted with a chaperone present.  Constitutional:      Appearance: Normal appearance.   Cardiovascular:     Rate and Rhythm: Normal rate and regular rhythm.     Pulses: Normal pulses.     Heart sounds: Normal heart sounds.  Pulmonary:     Effort: Pulmonary effort is normal.     Breath sounds: Normal breath sounds.  Abdominal:     Palpations: Abdomen is soft. There is no hepatomegaly, splenomegaly or mass.     Tenderness: There is no abdominal tenderness.   Musculoskeletal:     Right lower leg: No edema.     Left lower leg: No edema.  Lymphadenopathy:     Cervical: No cervical adenopathy.     Right cervical: No superficial, deep or posterior cervical adenopathy.    Left cervical: No superficial, deep or posterior cervical adenopathy.     Upper Body:     Right upper body: No supraclavicular or axillary adenopathy.     Left upper body: No supraclavicular or axillary adenopathy.   Neurological:     General: No focal deficit present.     Mental Status: He  is alert and oriented to person, place, and time.   Psychiatric:        Mood and Affect: Mood normal.        Behavior: Behavior normal.     LABS:      Latest Ref Rng & Units 12/13/2023   12:51 PM 08/17/2023   12:59 PM 05/17/2023    1:09 PM  CBC  WBC 4.0 - 10.5 K/uL 5.7  6.7  6.0   Hemoglobin 13.0 - 17.0 g/dL 9.2  89.8  88.9   Hematocrit 39.0 - 52.0 % 31.3  34.2  37.7   Platelets 150 - 400 K/uL 409  366  268       Latest Ref Rng & Units 12/13/2023   12:51 PM 08/17/2023   12:59 PM 05/17/2023    1:09 PM  CMP  Glucose 70 - 99 mg/dL 815  698  839   BUN 8 - 23 mg/dL 11  9  14    Creatinine 0.61 - 1.24 mg/dL 8.68  8.70  8.74   Sodium 135 - 145 mmol/L 135  134  135   Potassium 3.5 - 5.1 mmol/L 3.9  4.0  4.0   Chloride 98 - 111 mmol/L 101  100  101   CO2 22 - 32 mmol/L 24  25  26    Calcium  8.9 - 10.3 mg/dL 9.8  9.9  89.4   Total Protein 6.5 - 8.1 g/dL 8.2  7.7    Total Bilirubin 0.0 - 1.2 mg/dL 0.4  0.4    Alkaline Phos 38 - 126 U/L 119  125    AST 15 - 41 U/L 18  19    ALT 0 - 44 U/L 15  17       Lab Results  Component Value Date   CEA 2.8 06/30/2013   /  CEA  Date Value Ref Range Status  06/30/2013 2.8 0.0 - 5.0 ng/mL Final   No results found for: PSA1 No results found for: CAN199 No results found for: RJW874  Lab Results  Component Value Date   TOTALPROTELP 7.3 12/13/2023   ALBUMINELP 3.1 12/13/2023   A1GS 0.3 12/13/2023   A2GS 1.1 (H) 12/13/2023   BETS 1.0 12/13/2023   GAMS 1.8 12/13/2023   MSPIKE 1.4 (H) 12/13/2023   SPEI Comment 12/13/2023  Lab Results  Component Value Date   TIBC 375 12/13/2023   TIBC 332 08/17/2023   TIBC 323 04/06/2023   FERRITIN 9 (L) 12/13/2023   FERRITIN 32 08/17/2023   FERRITIN 82 04/06/2023   IRONPCTSAT 6 (L) 12/13/2023   IRONPCTSAT 14 (L) 08/17/2023   IRONPCTSAT 13 (L) 04/06/2023   Lab Results  Component Value Date   LDH 89 (L) 12/25/2022   LDH 79 (L) 09/14/2022   LDH 85 (L) 07/17/2022     STUDIES:   No results  found.

## 2024-01-05 ENCOUNTER — Inpatient Hospital Stay: Admitting: Licensed Clinical Social Worker

## 2024-01-05 DIAGNOSIS — D472 Monoclonal gammopathy: Secondary | ICD-10-CM

## 2024-01-05 NOTE — Progress Notes (Signed)
 [facility] CSW Progress Note  Clinical Child psychotherapist contacted patient by phone to follow-up on referral for Advanced Directives.    Interventions: Provided education and assistance to client regarding Advanced Directives.       Follow Up Plan:  CSW will see patient on January 12, 2024 to complete Advanced Directives.     Devere JONELLE Manna, LCSW Clinical Social Worker [facility]

## 2024-01-06 ENCOUNTER — Encounter: Payer: Self-pay | Admitting: Hematology

## 2024-01-06 NOTE — Progress Notes (Signed)
 Reason for Visit: Chaplain making rounds on the floor visiting infusion Pts   Description of Visit: Arriving in the room I found James Zuniga in a recliner chair receiving treatment.  He was willing to speak with me and we made small talk conversation as we established connection.   James Zuniga was wearing an orange [place] Knicks shirt and a Knicks lanyard around his neck.  We spoke about basketball briefly as I worked to create connection.     James Zuniga is from [place] and shared with me how nice it has been for him to come to the [facility]; it is both convenient and pleasant as he has found the team here to be excellent.   I introduced myself to James Zuniga as the new chaplain of the cancer center and made him aware of my presence here and availability to him for support and care.    Plan of Care: I will look to connect with James Zuniga again at his next appointment.   Chaplain Maude Roll, MDiv  [email or web address] [phone]

## 2024-01-12 ENCOUNTER — Inpatient Hospital Stay: Attending: Hematology | Admitting: Licensed Clinical Social Worker

## 2024-01-12 ENCOUNTER — Other Ambulatory Visit: Payer: Self-pay | Admitting: Hematology

## 2024-01-12 DIAGNOSIS — D472 Monoclonal gammopathy: Secondary | ICD-10-CM

## 2024-01-12 NOTE — Progress Notes (Signed)
 [facility] Healthcare Advance Directives Clinical Social Work  Patient presented to Advance Directives Clinic  to review and complete healthcare advance directives.  Clinical Social Worker met with patient and wife.  The patient designated Evren Shankland (wife [phone]) as their primary healthcare agent and Beryle Holms (daughter [phone]) as their secondary agent.  Patient also completed healthcare living will.    Documents were notarized and copies made for patient/family. Clinical Social Worker will send documents to medical records to be scanned into patient's chart. Clinical Social Worker encouraged patient/family to contact with any additional questions or concerns.   Devere JONELLE Manna, LCSW Clinical Social Worker [facility]

## 2024-02-08 ENCOUNTER — Other Ambulatory Visit: Payer: Self-pay | Admitting: Hematology

## 2024-03-06 ENCOUNTER — Other Ambulatory Visit: Payer: Self-pay | Admitting: *Deleted

## 2024-03-06 MED ORDER — VARENICLINE TARTRATE 1 MG PO TABS: 1.0000 mg | ORAL_TABLET | Freq: Two times a day (BID) | ORAL | 0 refills | Status: DC

## 2024-03-27 ENCOUNTER — Inpatient Hospital Stay: Attending: Hematology

## 2024-03-27 DIAGNOSIS — D472 Monoclonal gammopathy: Secondary | ICD-10-CM | POA: Insufficient documentation

## 2024-03-27 DIAGNOSIS — D509 Iron deficiency anemia, unspecified: Secondary | ICD-10-CM | POA: Diagnosis not present

## 2024-03-27 DIAGNOSIS — N189 Chronic kidney disease, unspecified: Secondary | ICD-10-CM | POA: Insufficient documentation

## 2024-03-27 DIAGNOSIS — D631 Anemia in chronic kidney disease: Secondary | ICD-10-CM | POA: Diagnosis not present

## 2024-03-27 LAB — CBC WITH DIFFERENTIAL/PLATELET
Abs Immature Granulocytes: 0.02 K/uL (ref 0.00–0.07)
Basophils Absolute: 0 K/uL (ref 0.0–0.1)
Basophils Relative: 1 %
Eosinophils Absolute: 0.3 K/uL (ref 0.0–0.5)
Eosinophils Relative: 5 %
HCT: 32.7 % — ABNORMAL LOW (ref 39.0–52.0)
Hemoglobin: 9.7 g/dL — ABNORMAL LOW (ref 13.0–17.0)
Immature Granulocytes: 0 %
Lymphocytes Relative: 26 %
Lymphs Abs: 1.6 K/uL (ref 0.7–4.0)
MCH: 24.9 pg — ABNORMAL LOW (ref 26.0–34.0)
MCHC: 29.7 g/dL — ABNORMAL LOW (ref 30.0–36.0)
MCV: 83.8 fL (ref 80.0–100.0)
Monocytes Absolute: 0.5 K/uL (ref 0.1–1.0)
Monocytes Relative: 8 %
Neutro Abs: 3.7 K/uL (ref 1.7–7.7)
Neutrophils Relative %: 60 %
Platelets: 340 K/uL (ref 150–400)
RBC: 3.9 MIL/uL — ABNORMAL LOW (ref 4.22–5.81)
RDW: 18.5 % — ABNORMAL HIGH (ref 11.5–15.5)
WBC: 6.1 K/uL (ref 4.0–10.5)
nRBC: 0 % (ref 0.0–0.2)

## 2024-03-27 LAB — FERRITIN: Ferritin: 42 ng/mL (ref 24–336)

## 2024-03-27 LAB — IRON AND TIBC
Iron: 30 ug/dL — ABNORMAL LOW (ref 45–182)
Saturation Ratios: 10 % — ABNORMAL LOW (ref 17.9–39.5)
TIBC: 299 ug/dL (ref 250–450)
UIBC: 269 ug/dL

## 2024-03-28 ENCOUNTER — Encounter: Payer: Self-pay | Admitting: Internal Medicine

## 2024-03-28 ENCOUNTER — Other Ambulatory Visit

## 2024-04-04 ENCOUNTER — Other Ambulatory Visit: Payer: Self-pay | Admitting: Oncology

## 2024-04-04 ENCOUNTER — Inpatient Hospital Stay: Admitting: Oncology

## 2024-04-04 VITALS — BP 112/80 | HR 83 | Temp 96.3°F | Resp 18 | Wt 226.0 lb

## 2024-04-04 DIAGNOSIS — D649 Anemia, unspecified: Secondary | ICD-10-CM

## 2024-04-04 DIAGNOSIS — D472 Monoclonal gammopathy: Secondary | ICD-10-CM

## 2024-04-04 NOTE — Progress Notes (Signed)
 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 MM labs from 12/13/23 stable. Skeletal survey 2/25 with no lytic lesions. No 'CRAB' features. Will repeat MM labs in 6 months (due in December 2025). 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  Denies BRBPR/melena.  He's on ASA and Plavix .  He has received multiple iron  infusions most recently given INFeD  on 01/04/2024. Most recent labs showed iron  saturation 10% with ferritin of 42.  Hemoglobin 9.7. Iron  saturations are still low at 10% and I would recommend additional IV iron  to get his saturations closer to 30%. RTC in 3 months with labs and see NP.    Orders Placed This Encounter  Procedures   CBC with Differential    Standing Status:   Future    Expected Date:   07/04/2024    Expiration Date:   10/02/2024   Comprehensive metabolic panel    Standing Status:   Future    Expected Date:   07/04/2024    Expiration Date:   10/02/2024   Ferritin    Standing Status:   Future    Expected Date:   07/04/2024    Expiration Date:   10/02/2024   Iron  and TIBC ([facility] ONLY)    Standing Status:   Future    Expected Date:   07/04/2024    Expiration Date:   10/02/2024   Kappa/lambda light chains    Standing Status:   Future    Expected Date:   07/04/2024    Expiration Date:   10/02/2024   Protein electrophoresis, serum    Standing Status:   Future    Expected Date:   07/04/2024    Expiration Date:   10/02/2024    INTERVAL HISTORY: Patient returns for follow-up for smoldering myeloma and normocytic anemia.  He was just recently evaluated for his multiple myeloma without evidence of crab criteria.  He was found to be slightly iron  deficient and given 1 g INFeD  on 01/04/2024.  He has received multiple infusions in the past.  He is on aspirin  and Plavix .  He denies any bleeding, bright red  blood per rectum, melena or hematochezia.  Most recent colonoscopy from 11/06/2022 showed one 3 mm polyp in the cecum with benign pathology.  EGD showed a nonobstructing Schatzki ring, small hiatal hernia, small duodenal polyp and a large polypoid lesion.  All pathology was benign and negative for intestinal metaplasia or malignancy.  Reports improved as times following iron  infusions.  States over the past 2 to 3 weeks he has started to feel more weak and tired.  He has more shortness of breath and chest pain as well.  Has occasional dizziness and chronic numbness and burning in his hands and feet and to the back of his left leg.  He denies any bleeding that he can see.  He continues Plavix  and aspirin .  He is scheduled to see GI next month.  We reviewed CBC, ferritin and iron  panel.  SUMMARY OF HEMATOLOGIC HISTORY: Oncology History Overview Note  Normocytic anemia: - Anemia from blood loss and CKD. - Colonoscopy (08/26/2017) normal. - Small bowel endoscopy (05/24/2018): 1 gastric polyp, 1 nonbleeding angiectasia in duodenum treated with APC.  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  Normal duodenum, first second and third parts. - He was receiving intermittent Venofer  infusions, last 1 on 09/08/2021. - He was also receiving  B12 injections, last 1 on 05/25/2022. - BMBX (10/06/2022): Hypercellular marrow (60%) involved by plasma cell neoplasm, 9% plasma cells by manual aspirate differential and 10% by CD138 IHC.  No evidence of morphologic dysplasia.  No ring sideroblasts. - Myeloma FISH panel: t(11;14) consistent with standard risk.  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]   Chronic iron  deficiency anemia     CBC    Component Value Date/Time   WBC 6.1 03/27/2024 0948   RBC 3.90 (L) 03/27/2024 0948   HGB 9.7 (L) 03/27/2024 0948   HGB 12.1 (L) 11/25/2021 1426   HGB 15.2 12/18/2016 0804   HCT 32.7 (L) 03/27/2024 0948   HCT 46.5 12/18/2016 0804   PLT 340  03/27/2024 0948   PLT 285 11/25/2021 1426   PLT 291 12/18/2016 0804   MCV 83.8 03/27/2024 0948   MCV 91 12/18/2016 0804   MCH 24.9 (L) 03/27/2024 0948   MCHC 29.7 (L) 03/27/2024 0948   RDW 18.5 (H) 03/27/2024 0948   RDW 14.7 12/18/2016 0804   LYMPHSABS 1.6 03/27/2024 0948   LYMPHSABS 2.0 12/18/2016 0804   MONOABS 0.5 03/27/2024 0948   EOSABS 0.3 03/27/2024 0948   EOSABS 0.2 12/18/2016 0804   BASOSABS 0.0 03/27/2024 0948   BASOSABS 0.0 12/18/2016 0804       Latest Ref Rng & Units 12/13/2023   12:51 PM 08/17/2023   12:59 PM 05/17/2023    1:09 PM  CMP  Glucose 70 - 99 mg/dL 815  698  839   BUN 8 - 23 mg/dL 11  9  14    Creatinine 0.61 - 1.24 mg/dL 8.68  8.70  8.74   Sodium 135 - 145 mmol/L 135  134  135   Potassium 3.5 - 5.1 mmol/L 3.9  4.0  4.0   Chloride 98 - 111 mmol/L 101  100  101   CO2 22 - 32 mmol/L 24  25  26    Calcium  8.9 - 10.3 mg/dL 9.8  9.9  89.4   Total Protein 6.5 - 8.1 g/dL 8.2  7.7    Total Bilirubin 0.0 - 1.2 mg/dL 0.4  0.4    Alkaline Phos 38 - 126 U/L 119  125    AST 15 - 41 U/L 18  19    ALT 0 - 44 U/L 15  17       Lab Results  Component Value Date   FERRITIN 42 03/27/2024   VITAMINB12 898 07/17/2022    Vitals:   04/04/24 0913  BP: 112/80  Pulse: 83  Resp: 18  Temp: (!) 96.3 F (35.7 C)  SpO2: 100%    Review of System:  Review of Systems  Constitutional:  Positive for malaise/fatigue.  Respiratory:  Positive for shortness of breath.   Cardiovascular:  Positive for chest pain.  Neurological:  Positive for dizziness, tingling and sensory change.    Physical Exam: Physical Exam Constitutional:      Appearance: Normal appearance.  HENT:     Head: Normocephalic and atraumatic.  Eyes:     Pupils: Pupils are equal, round, and reactive to light.  Cardiovascular:     Rate and Rhythm: Normal rate and regular rhythm.     Heart sounds: Normal heart sounds. No murmur heard. Pulmonary:     Effort: Pulmonary effort is normal.     Breath sounds:  Normal breath sounds. No wheezing.  Abdominal:     General: Bowel sounds are normal. There is no distension.     Palpations:  Abdomen is soft.     Tenderness: There is no abdominal tenderness.  Musculoskeletal:        General: Normal range of motion.     Cervical back: Normal range of motion.  Skin:    General: Skin is warm and dry.     Findings: No rash.  Neurological:     Mental Status: He is alert and oriented to person, place, and time.     Gait: Gait is intact.  Psychiatric:        Mood and Affect: Mood and affect normal.        Cognition and Memory: Memory normal.        Judgment: Judgment normal.      I spent 20 minutes dedicated to the care of this patient (face-to-face and non-face-to-face) on the date of the encounter to include what is described in the assessment and plan.,  Delon Hope, NP 04/04/2024 9:54 AM

## 2024-04-04 NOTE — Assessment & Plan Note (Addendum)
 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  Denies BRBPR/melena.  He's on ASA and Plavix .  He has received multiple iron  infusions most recently given INFeD  on 01/04/2024. Most recent labs showed iron  saturation 10% with ferritin of 42.  Hemoglobin 9.7. Iron  saturations are still low at 10% and I would recommend additional IV iron  to get his saturations closer to 30%. RTC in 3 months with labs and see NP.

## 2024-04-04 NOTE — Assessment & Plan Note (Addendum)
 No recurrent infections. MM labs from 12/13/23 stable. Skeletal survey 2/25 with no lytic lesions. No 'CRAB' features. Will repeat MM labs in 6 months (due in December 2025).

## 2024-04-06 ENCOUNTER — Inpatient Hospital Stay

## 2024-04-06 VITALS — BP 109/74 | HR 75 | Temp 97.8°F | Resp 18

## 2024-04-06 DIAGNOSIS — D472 Monoclonal gammopathy: Secondary | ICD-10-CM | POA: Diagnosis not present

## 2024-04-06 DIAGNOSIS — D509 Iron deficiency anemia, unspecified: Secondary | ICD-10-CM

## 2024-04-06 MED ORDER — FAMOTIDINE IN NACL 20-0.9 MG/50ML-% IV SOLN
20.0000 mg | Freq: Once | INTRAVENOUS | Status: AC
  Administered 2024-04-06: 20 mg via INTRAVENOUS
  Filled 2024-04-06: qty 50

## 2024-04-06 MED ORDER — CETIRIZINE HCL 10 MG/ML IV SOLN
10.0000 mg | Freq: Once | INTRAVENOUS | Status: AC
  Administered 2024-04-06: 10 mg via INTRAVENOUS
  Filled 2024-04-06: qty 1

## 2024-04-06 MED ORDER — ACETAMINOPHEN 325 MG PO TABS
650.0000 mg | ORAL_TABLET | Freq: Once | ORAL | Status: AC
  Administered 2024-04-06: 650 mg via ORAL
  Filled 2024-04-06: qty 2

## 2024-04-06 MED ORDER — METHYLPREDNISOLONE SODIUM SUCC 125 MG IJ SOLR
125.0000 mg | Freq: Once | INTRAMUSCULAR | Status: AC
  Administered 2024-04-06: 125 mg via INTRAVENOUS
  Filled 2024-04-06: qty 2

## 2024-04-06 MED ORDER — SODIUM CHLORIDE 0.9 % IV SOLN: INTRAVENOUS | Status: DC

## 2024-04-06 MED ORDER — SODIUM CHLORIDE 0.9 % IV SOLN
1000.0000 mg | Freq: Once | INTRAVENOUS | Status: AC
  Administered 2024-04-06: 1000 mg via INTRAVENOUS
  Filled 2024-04-06: qty 20

## 2024-04-06 NOTE — Progress Notes (Signed)
 Patient tolerated iron  infusion with no complaints voiced. Patient refused to wait post 30 min wait time. Peripheral IV site clean and dry with good blood return noted before and after infusion. Band aid applied. VSS with discharge and left in satisfactory condition with no s/s of distress noted.

## 2024-04-06 NOTE — Patient Instructions (Signed)
 [facility] [place] - A DEPT OF [facility]  Discharge Instructions: Thank you for choosing [facility] to provide your oncology and hematology care.  If you have a lab appointment with the Cancer Center - please note that after April 8th, 2024, all labs will be drawn in the cancer center.  You do not have to check in or register with the main entrance as you have in the past but will complete your check-in in the cancer center.  Wear comfortable clothing and clothing appropriate for easy access to any Portacath or PICC line.   We strive to give you quality time with your provider. You may need to reschedule your appointment if you arrive late (15 or more minutes).  Arriving late affects you and other patients whose appointments are after yours.  Also, if you miss three or more appointments without notifying the office, you may be dismissed from the clinic at the provider's discretion.      For prescription refill requests, have your pharmacy contact our office and allow 72 hours for refills to be completed.    Today you received the following Infed, return as scheduled.   To help prevent nausea and vomiting after your treatment, we encourage you to take your nausea medication as directed.  BELOW ARE SYMPTOMS THAT SHOULD BE REPORTED IMMEDIATELY: *FEVER GREATER THAN 100.4 F (38 C) OR HIGHER *CHILLS OR SWEATING *NAUSEA AND VOMITING THAT IS NOT CONTROLLED WITH YOUR NAUSEA MEDICATION *UNUSUAL SHORTNESS OF BREATH *UNUSUAL BRUISING OR BLEEDING *URINARY PROBLEMS (pain or burning when urinating, or frequent urination) *BOWEL PROBLEMS (unusual diarrhea, constipation, pain near the anus) TENDERNESS IN MOUTH AND THROAT WITH OR WITHOUT PRESENCE OF ULCERS (sore throat, sores in mouth, or a toothache) UNUSUAL RASH, SWELLING OR PAIN  UNUSUAL VAGINAL DISCHARGE OR ITCHING   Items with * indicate a potential emergency and should be followed up as soon as possible or go  to the Emergency Department if any problems should occur.  Please show the CHEMOTHERAPY ALERT CARD or IMMUNOTHERAPY ALERT CARD at check-in to the Emergency Department and triage nurse.  Should you have questions after your visit or need to cancel or reschedule your appointment, please contact [facility] [place] - A DEPT OF [facility] [phone]  and follow the prompts.  Office hours are 8:00 a.m. to 4:30 p.m. Monday - Friday. Please note that voicemails left after 4:00 p.m. may not be returned until the following business day.  We are closed weekends and major holidays. You have access to a nurse at all times for urgent questions. Please call the main number to the clinic [phone] and follow the prompts.  For any non-urgent questions, you may also contact your provider using MyChart. We now offer e-Visits for anyone 16 and older to request care online for non-urgent symptoms. For details visit [email or web address].   Also download the MyChart app! Go to the app store, search "MyChart", open the app, select , and log in with your MyChart username and password.

## 2024-04-25 ENCOUNTER — Ambulatory Visit: Admitting: Internal Medicine

## 2024-04-25 ENCOUNTER — Encounter: Payer: Self-pay | Admitting: *Deleted

## 2024-04-25 VITALS — BP 111/75 | HR 76 | Temp 98.4°F | Ht 66.0 in | Wt 230.4 lb

## 2024-04-25 DIAGNOSIS — D509 Iron deficiency anemia, unspecified: Secondary | ICD-10-CM

## 2024-04-25 DIAGNOSIS — K319 Disease of stomach and duodenum, unspecified: Secondary | ICD-10-CM | POA: Diagnosis not present

## 2024-04-25 DIAGNOSIS — K769 Liver disease, unspecified: Secondary | ICD-10-CM | POA: Diagnosis not present

## 2024-04-25 NOTE — Patient Instructions (Signed)
 It was good to see you again today  As discussed from recommendations last year we will go ahead and set up upper endoscopy to look your duodenum or small intestine to see if there are any polyps left.  Afterwards, we will schedule an MRI of the liver and pancreas if feasible due to your defibrillator being implanted  Hold Plavix  x 5 days.  Hold other medications per protocol  If future colonoscopy is not needed recommended  Further recommendations to follow.

## 2024-04-25 NOTE — Progress Notes (Signed)
 Gastroenterology Progress Note    Primary Care Physician:  Joshua Santana CROME, NP Primary Gastroenterologist:  Dr. Shaaron  Pre-Procedure History & Physical: HPI:  James Zuniga is a 68 y.o. male here for for follow-up of duodenal polyps and hepatic lesions.  Patient has a complicated medical history including congestive heart failure COPD, CAD AICD in place  History of multiple duodenal adenomas removed here in [place] over the past couple years EUS last year and EGD with [facility] revealed adenomatous lesions that were cold snared no obvious underlying neoplasm with EUS.  It was recommended that he have a repeat EGD in a year along with a updated MRI liver and pancreaticobiliary tree as feasible with the presence of AICD.  Patient continues to be iron  deficient and followed by oncology for smoldering multiple myeloma.  He had a small benign polyp removed from his colon a couple years ago follow-up a couple of years ago.  Future colonoscopy not recommended.  Prior history of scattered small bowel AVMs ablated at enteroscopy.  Ongoing iron  deficiency requiring multiple doses of intravenous iron .  Patient has not had any overt GI bleeding  Past Medical History:  Diagnosis Date   Arthritis    all over (08/24/2017)   CHF (congestive heart failure) (HCC)    Chronic airway obstruction (HCC)    Chronic lower back pain    Coronary artery disease    Disorder of liver    spots on my liver; they've rechecked them; they haven't changed (08/24/2017)   Dyspnea    Esophageal reflux    Gout    on daily RX (08/24/2017)   Hyperlipidemia    Hypertension    Hypokalemia 11/2016   Hypotestosteronism 07/01/2013   Insomnia    Iron  deficiency anemia, unspecified 07/01/2013   Memory loss    On home oxygen  therapy    2L prn (08/24/2017)   OSA on CPAP    PVD (peripheral vascular disease)    Renal disorder    Stroke (HCC) 01/2017   left sided weakness remains (08/24/2017)   Type II diabetes  mellitus (HCC)    Vitamin D  deficiency     Past Surgical History:  Procedure Laterality Date   BIOPSY  11/06/2022   Procedure: BIOPSY;  Surgeon: Shaaron Lamar HERO, MD;  Location: [facility];  Service: Endoscopy;;   BIOPSY  04/01/2023   Procedure: BIOPSY;  Surgeon: Wilhelmenia Aloha Raddle., MD;  Location: [facility];  Service: Gastroenterology;;   COLONOSCOPY N/A 08/26/2017   Procedure: COLONOSCOPY;  Surgeon: Avram Lupita BRAVO, MD;  Location: [facility] END[facility]COPY;  Service: Endoscopy;  Laterality: N/A;   COLONOSCOPY WITH PROPOFOL  N/A 11/06/2022   Procedure: COLONOSCOPY WITH PROPOFOL ;  Surgeon: Shaaron Lamar HERO, MD;  Location: [facility];  Service: Endoscopy;  Laterality: N/A;  1:15 PM   ENTEROSCOPY N/A 05/24/2018   Procedure: ENTEROSCOPY;  Surgeon: San Sandor GAILS, DO;  Location: [facility];  Service: Gastroenterology;  Laterality: N/A;   ESOPHAGOGASTRODUODENOSCOPY N/A 08/26/2017   Procedure: ESOPHAGOGASTRODUODENOSCOPY (EGD);  Surgeon: Avram Lupita BRAVO, MD;  Location: [facility] ENDOSCOPY;  Service: Endoscopy;  Laterality: N/A;   ESOPHAGOGASTRODUODENOSCOPY (EGD) WITH PROPOFOL  N/A 11/06/2022   Procedure: ESOPHAGOGASTRODUODENOSCOPY (EGD) WITH PROPOFOL ;  Surgeon: Shaaron Lamar HERO, MD;  Location: [facility];  Service: Endoscopy;  Laterality: N/A;   ESOPHAGOGASTRODUODENOSCOPY (EGD) WITH PROPOFOL  N/A 04/01/2023   Procedure: ESOPHAGOGASTRODUODENOSCOPY (EGD) WITH PROPOFOL ;  Surgeon: Wilhelmenia Aloha Raddle., MD;  Location: [facility];  Service: Gastroenterology;  Laterality: N/A;   EUS N/A  04/01/2023   Procedure: UPPER ENDOSCOPIC ULTRASOUND (EUS) RADIAL;  Surgeon: Wilhelmenia Aloha Raddle., MD;  Location: [facility];  Service: Gastroenterology;  Laterality: N/A;   HEMOSTASIS CLIP PLACEMENT  04/01/2023   Procedure: HEMOSTASIS CLIP PLACEMENT;  Surgeon: Wilhelmenia Aloha Raddle., MD;  Location: [facility];  Service: Gastroenterology;;   HOT HEMOSTASIS N/A 05/24/2018   Procedure: HOT HEMOSTASIS (ARGON PLASMA  COAGULATION/BICAP);  Surgeon: San Sandor GAILS, DO;  Location: [facility];  Service: Gastroenterology;  Laterality: N/A;   KNEE ARTHROSCOPY Left    LOOP RECORDER IMPLANT  06/2017   POLYPECTOMY  05/24/2018   Procedure: POLYPECTOMY;  Surgeon: San Sandor GAILS, DO;  Location: [facility];  Service: Gastroenterology;;   POLYPECTOMY  11/06/2022   Procedure: POLYPECTOMY;  Surgeon: Shaaron Lamar HERO, MD;  [facility]: [facility];  Service: Endoscopy;;   POLYPECTOMY  04/01/2023   Procedure: POLYPECTOMY;  Surgeon: Wilhelmenia Aloha Raddle., MD;  Location: [facility];  Service: Gastroenterology;;   TEE WITHOUT CARDIOVERSION N/A 12/24/2016   Procedure: TRANSESOPHAGEAL ECHOCARDIOGRAM (TEE);  Surgeon: Okey Vina GAILS, MD;  Location: [facility]COPY;  Service: Cardiovascular;  Laterality: N/A;    Prior to Admission medications   Medication Sig Start Date End Date Taking? Authorizing Provider  albuterol  (VENTOLIN  HFA) 108 (90 Base) MCG/ACT inhaler Inhale 2 puffs into the lungs every 6 (six) hours as needed for wheezing or shortness of breath. 09/08/19  Yes Sood, Vineet, MD  allopurinol  (ZYLOPRIM ) 300 MG tablet Take 300 mg by mouth daily.   Yes [provider]  amLODipine (NORVASC) 5 MG tablet Take 1 tablet by mouth daily. 10/28/23  Yes [provider]  aspirin  EC 81 MG tablet Take 81 mg by mouth daily. Swallow whole.   Yes [provider]  atorvastatin  (LIPITOR ) 80 MG tablet Take 1 tablet (80 mg total) by mouth every morning. 12/25/16  Yes Svalina, Gorica, MD  BD INSULIN  SYRINGE ULTRAFINE 31G X 5/16 0.5 ML MISC INJECT 1 EACH UNDER THE SKIN DAILY AS NEEDED   Yes [provider]  BELSOMRA 10 MG TABS Take 1 tablet by mouth at bedtime as needed.   Yes [provider]  Blood Glucose Monitoring Suppl (FIFTY50 GLUCOSE METER 2.0) w/Device KIT To check blood sugars TID or Use as instructed Include strips. Lancets, lancet device, control solution. batteries 04/11/19  Yes [provider]  clopidogrel  (PLAVIX ) 75 MG tablet Take 1 tablet (75 mg total) by mouth daily. 04/03/23  Yes Mansouraty, Aloha Raddle., MD  colchicine 0.6 MG tablet Take 0.6 mg by mouth daily. 03/04/21  Yes [provider]  Continuous Glucose Receiver (FREESTYLE LIBRE 3 READER) DEVI  10/28/23  Yes [provider]  Continuous Glucose Sensor (FREESTYLE LIBRE 3 SENSOR) MISC  08/15/23  Yes [provider]  Cyanocobalamin  (VITAMIN B-12 IJ) Inject 1,000 mg as directed See admin instructions. Every 30 days as needed   Yes [provider]  diclofenac  Sodium (VOLTAREN ) 1 % GEL Apply 2 g topically daily as needed (pain). 04/07/21  Yes [provider]  DULoxetine  (CYMBALTA ) 60 MG capsule Take 60 mg by mouth 2 (two) times daily.    Yes [provider]  fluticasone  (FLONASE ) 50 MCG/ACT nasal spray Place 1 spray into both nostrils daily. 10/28/21  Yes Sood, Vineet, MD  fluticasone  furoate-vilanterol (BREO ELLIPTA ) 200-25 MCG/ACT AEPB Inhale 1 puff into the lungs daily. 02/23/23  Yes Hope Almarie ORN, NP  furosemide  (LASIX ) 20 MG tablet Take 20 mg by mouth every Tuesday, Thursday, and Saturday at 6 PM.  03/21/13  Yes [provider]  gabapentin  (NEURONTIN ) 300 MG capsule Take 600 mg by mouth 3 (three) times daily. 10/18/14  Yes [provider]  glucose blood test strip 1 each by Misc.(Non-Drug; Combo Route) route daily. 05/26/17  Yes [provider]  HUMULIN R  100 UNIT/ML injection Use as sliding scale up to 10 units tid with meals 08/13/23  Yes [provider]  Insulin  Glargine (TOUJEO  SOLOSTAR [place]) Inject 70 Units into the skin daily with supper. 04/28/21  Yes [provider]  insulin  NPH-regular Human (NOVOLIN 70/30) (70-30) 100 UNIT/ML injection Please inject 25 units twice daily before breakfast and dinner. 08/26/17  Yes Caleen Qualia, MD  Insulin  Pen Needle (FIFTY50 PEN NEEDLES) 32G X 4 MM MISC 1 each by Misc.(Non-Drug; Combo  Route) route daily. 04/18/19  Yes [provider]  ipratropium-albuterol  (DUONEB) 0.5-2.5 (3) MG/3ML SOLN USE 1 VIAL IN NEBULIZER 4 TIMES DAILY 05/22/21  Yes Sood, Vineet, MD  Lancets (ONETOUCH ULTRASOFT) lancets USE 1 LANCET TO CHECK GLUCOSE IN THE MORNING BEFORE BREAKFAST 03/28/18  Yes [provider]  losartan  (COZAAR ) 25 MG tablet Take 25 mg by mouth at bedtime.   Yes [provider]  meclizine  (ANTIVERT ) 12.5 MG tablet Take 1 tablet (12.5 mg total) by mouth 3 (three) times daily as needed for dizziness. 07/23/21  Yes Patt Alm Macho, MD  methocarbamol (ROBAXIN) 500 MG tablet Take 500 mg by mouth 3 (three) times daily as needed for muscle spasms. 04/09/21  Yes [provider]  metoprolol  succinate (TOPROL -XL) 100 MG 24 hr tablet Take 100 mg by mouth daily. 07/01/22  Yes [provider]  nystatin cream (MYCOSTATIN) Apply topically 2 (two) times daily as needed. 06/02/23  Yes [provider]  OZEMPIC, 0.25 OR 0.5 MG/DOSE, 2 MG/3ML SOPN Inject 0.25 mg as directed once a week. 09/24/23  Yes [provider]  pantoprazole  (PROTONIX ) 20 MG tablet TAKE 1 TABLET BY MOUTH TWICE DAILY BEFORE A MEAL 10/21/23  Yes Mansouraty, Gabriel Jr., MD  potassium chloride  SA (K-DUR,KLOR-CON ) 20 MEQ tablet Take 1 tablet (20 mEq total) by mouth 2 (two) times daily. 05/03/15  Yes Timmy Maude SAUNDERS, MD  primidone  (MYSOLINE ) 50 MG tablet Take 100 mg by mouth 2 (two) times daily. 01/26/17  Yes [provider]  sildenafil (VIAGRA) 100 MG tablet Take 100 mg by mouth at bedtime as needed.   Yes [provider]  spironolactone (ALDACTONE) 25 MG tablet Take 25 mg by mouth every Monday, Wednesday, and Friday. 08/18/19  Yes [provider]  tamsulosin  (FLOMAX ) 0.4 MG CAPS capsule Take 1 capsule (0.4 mg total) by mouth daily after breakfast. 12/06/15  Yes Ennever, Maude SAUNDERS, MD  tiZANidine  (ZANAFLEX ) 4 MG tablet Take 4 mg by mouth 3 (three) times daily as  needed for muscle spasms.  10/04/14  Yes [provider]  traMADol  (ULTRAM ) 50 MG tablet Take 50 mg by mouth daily as needed for moderate pain.  09/08/13  Yes [provider]  varenicline  (CHANTIX ) 1 MG tablet Take 1 tablet by mouth twice daily 04/04/24  Yes Burns, Jennifer E, NP  zolpidem  (AMBIEN  CR) 12.5 MG CR tablet Take 12.5 mg by mouth at bedtime. 05/12/21  Yes [provider]    Allergies as of 04/25/2024   (No Known Allergies)    Family History  Problem Relation Age of Onset   Heart disease Mother    Diabetes Father    Stroke Brother    Stroke Paternal Uncle  Diabetes Sister    Heart attack Brother    Heart attack Brother     Social History   Socioeconomic History   Marital status: Married    Spouse name: Not on file   Number of children: Not on file   Years of education: Not on file   Highest education level: Not on file  Occupational History   Occupation: diabled    Comment: as of early 70s  Tobacco Use   Smoking status: Every Day    Current packs/day: 1.50    Average packs/day: 0.5 packs/day for 35.1 years (18.7 ttl pk-yrs)    Types: Cigarettes    Start date: 08/05/1983    Last attempt to quit: 08/15/2017   Smokeless tobacco: Never  Vaping Use   Vaping status: Never Used  Substance and Sexual Activity   Alcohol use: No    Alcohol/week: 0.0 standard drinks of alcohol   Drug use: No   Sexual activity: Not Currently  Other Topics Concern   Not on file  Social History Narrative   Not on file   Social Drivers of Health   Financial Resource Strain: Not on file  Food Insecurity: Low Risk  (03/02/2023)   Received from [facility]   Hunger Vital Sign    Within the past 12 months, you worried that your food would run out before you got money to buy more: Never true    Within the past 12 months, the food you bought just didn't last and you didn't have money to get more. : Never true  Transportation Needs: No Transportation Needs  (07/17/2022)   PRAPARE - Administrator, Civil Service (Medical): No    Lack of Transportation (Non-Medical): No  Physical Activity: Not on file  Stress: Not on file  Social Connections: Not on file  Intimate Partner Violence: Not At Risk (07/17/2022)   Humiliation, Afraid, Rape, and Kick questionnaire    Fear of Current or Ex-Partner: No    Emotionally Abused: No    Physically Abused: No    Sexually Abused: No    Review of Systems   See HPI, otherwise negative ROS  Physical Exam: BP 111/75   Pulse 76   Temp 98.4 F (36.9 C)   Ht 5' 6 (1.676 m)   Wt 230 lb 6.4 oz (104.5 kg)   BMI 37.19 kg/m  General:   Alert, somewhat frail pleasant and cooperative in NAD; accompanied by his spouse. Neck:  Supple; no masses or thyromegaly. No significant cervical adenopathy. Lungs:  Clear throughout to auscultation.   No wheezes, crackles, or rhonchi. No acute distress. Heart:  Regular rate and rhythm; no murmurs, clicks, rubs,  or gallops. Abdomen: Non-distended, normal bowel sounds.  Soft and nontender without appreciable mass or hepatosplenomegaly.    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  History of lesions of the liver consistent with hepatic cyst, hemangioma.  Recommendations were made for repeat EGD to look at the duodenum, etc. repeat imaging of liver and pancreaticobiliary tree with MRI as feasible. Standing iron  deficiency anemia likely multifactorial in etiology.  I am not convinced that all of his anemia secondary to GI blood loss.  He has had a thorough evaluation thus far.  Recommendations:  As discussed from recommendations last year we will go  ahead and set up upper endoscopy to look your duodenum or small intestine to see if  there are any polyps left.     The risks, benefits, limitations, alternatives and imponderables have been reviewed with the patient. Potential for esophageal dilation, biopsy, etc. have also been reviewed.  Questions have been answered. All parties agreeable.   Afterwards, we will schedule an MRI of the liver and pancreas if feasible due to your defibrillator being implanted  Hold Plavix  x 5 days.  Hold other medications per protocol  If future colonoscopy is not needed recommended  Further recommendations to follow.            Notice: This dictation was prepared with Dragon dictation along with smaller phrase technology. Any transcriptional errors that result from this process are unintentional and may not be corrected upon review.

## 2024-04-25 NOTE — H&P (View-Only) (Signed)
 Gastroenterology Progress Note    Primary Care Physician:  Joshua Santana CROME, NP Primary Gastroenterologist:  Dr. Shaaron  Pre-Procedure History & Physical: HPI:  James Zuniga is a 68 y.o. male here for for follow-up of duodenal polyps and hepatic lesions.  Patient has a complicated medical history including congestive heart failure COPD, CAD AICD in place  History of multiple duodenal adenomas removed here in [place] over the past couple years EUS last year and EGD with [facility] revealed adenomatous lesions that were cold snared no obvious underlying neoplasm with EUS.  It was recommended that he have a repeat EGD in a year along with a updated MRI liver and pancreaticobiliary tree as feasible with the presence of AICD.  Patient continues to be iron  deficient and followed by oncology for smoldering multiple myeloma.  He had a small benign polyp removed from his colon a couple years ago follow-up a couple of years ago.  Future colonoscopy not recommended.  Prior history of scattered small bowel AVMs ablated at enteroscopy.  Ongoing iron  deficiency requiring multiple doses of intravenous iron .  Patient has not had any overt GI bleeding  Past Medical History:  Diagnosis Date   Arthritis    all over (08/24/2017)   CHF (congestive heart failure) (HCC)    Chronic airway obstruction (HCC)    Chronic lower back pain    Coronary artery disease    Disorder of liver    spots on my liver; they've rechecked them; they haven't changed (08/24/2017)   Dyspnea    Esophageal reflux    Gout    on daily RX (08/24/2017)   Hyperlipidemia    Hypertension    Hypokalemia 11/2016   Hypotestosteronism 07/01/2013   Insomnia    Iron  deficiency anemia, unspecified 07/01/2013   Memory loss    On home oxygen  therapy    2L prn (08/24/2017)   OSA on CPAP    PVD (peripheral vascular disease)    Renal disorder    Stroke (HCC) 01/2017   left sided weakness remains (08/24/2017)   Type II diabetes  mellitus (HCC)    Vitamin D  deficiency     Past Surgical History:  Procedure Laterality Date   BIOPSY  11/06/2022   Procedure: BIOPSY;  Surgeon: Shaaron Lamar HERO, MD;  Location: [facility];  Service: Endoscopy;;   BIOPSY  04/01/2023   Procedure: BIOPSY;  Surgeon: Wilhelmenia Aloha Raddle., MD;  Location: [facility];  Service: Gastroenterology;;   COLONOSCOPY N/A 08/26/2017   Procedure: COLONOSCOPY;  Surgeon: Avram Lupita BRAVO, MD;  Location: [facility] ENDOSCOPY;  Service: Endoscopy;  Laterality: N/A;   COLONOSCOPY WITH PROPOFOL  N/A 11/06/2022   Procedure: COLONOSCOPY WITH PROPOFOL ;  Surgeon: Shaaron Lamar HERO, MD;  Location: [facility];  Service: Endoscopy;  Laterality: N/A;  1:15 PM   ENTEROSCOPY N/A 05/24/2018   Procedure: ENTEROSCOPY;  Surgeon: San Sandor GAILS, DO;  Location: [facility];  Service: Gastroenterology;  Laterality: N/A;   ESOPHAGOGASTRODUODENOSCOPY N/A 08/26/2017   Procedure: ESOPHAGOGASTRODUODENOSCOPY (EGD);  Surgeon: Avram Lupita BRAVO, MD;  Location: [facility] ENDOSCOPY;  Service: Endoscopy;  Laterality: N/A;   ESOPHAGOGASTRODUODENOSCOPY (EGD) WITH PROPOFOL  N/A 11/06/2022   Procedure: ESOPHAGOGASTRODUODENOSCOPY (EGD) WITH PROPOFOL ;  Surgeon: Shaaron Lamar HERO, MD;  [facility]: [facility];  Service: Endoscopy;  Laterality: N/A;   ESOPHAGOGASTRODUODENOSCOPY (EGD) WITH PROPOFOL  N/A 04/01/2023   Procedure: ESOPHAGOGASTRODUODENOSCOPY (EGD) WITH PROPOFOL ;  Surgeon: Wilhelmenia Aloha Raddle., MD;  Location: [facility];  Service: Gastroenterology;  Laterality: N/A;   EUS N/A  04/01/2023   Procedure: UPPER ENDOSCOPIC ULTRASOUND (EUS) RADIAL;  Surgeon: Wilhelmenia Aloha Raddle., MD;  Location: [facility];  Service: Gastroenterology;  Laterality: N/A;   HEMOSTASIS CLIP PLACEMENT  04/01/2023   Procedure: HEMOSTASIS CLIP PLACEMENT;  Surgeon: Wilhelmenia Aloha Raddle., MD;  Location: [facility];  Service: Gastroenterology;;   HOT HEMOSTASIS N/A 05/24/2018   Procedure: HOT HEMOSTASIS (ARGON PLASMA  COAGULATION/BICAP);  Surgeon: San Sandor GAILS, DO;  Location: [facility];  Service: Gastroenterology;  Laterality: N/A;   KNEE ARTHROSCOPY Left    LOOP RECORDER IMPLANT  06/2017   POLYPECTOMY  05/24/2018   Procedure: POLYPECTOMY;  Surgeon: San Sandor GAILS, DO;  Location: [facility];  Service: Gastroenterology;;   POLYPECTOMY  11/06/2022   Procedure: POLYPECTOMY;  Surgeon: Shaaron Lamar HERO, MD;  [facility]: [facility];  Service: Endoscopy;;   POLYPECTOMY  04/01/2023   Procedure: POLYPECTOMY;  Surgeon: Wilhelmenia Aloha Raddle., MD;  Location: [facility];  Service: Gastroenterology;;   TEE WITHOUT CARDIOVERSION N/A 12/24/2016   Procedure: TRANSESOPHAGEAL ECHOCARDIOGRAM (TEE);  Surgeon: Okey Vina GAILS, MD;  Location: [facility]COPY;  Service: Cardiovascular;  Laterality: N/A;    Prior to Admission medications   Medication Sig Start Date End Date Taking? Authorizing Provider  albuterol  (VENTOLIN  HFA) 108 (90 Base) MCG/ACT inhaler Inhale 2 puffs into the lungs every 6 (six) hours as needed for wheezing or shortness of breath. 09/08/19  Yes Sood, Vineet, MD  allopurinol  (ZYLOPRIM ) 300 MG tablet Take 300 mg by mouth daily.   Yes [provider]  amLODipine (NORVASC) 5 MG tablet Take 1 tablet by mouth daily. 10/28/23  Yes [provider]  aspirin  EC 81 MG tablet Take 81 mg by mouth daily. Swallow whole.   Yes [provider]  atorvastatin  (LIPITOR ) 80 MG tablet Take 1 tablet (80 mg total) by mouth every morning. 12/25/16  Yes Svalina, Gorica, MD  BD INSULIN  SYRINGE ULTRAFINE 31G X 5/16 0.5 ML MISC INJECT 1 EACH UNDER THE SKIN DAILY AS NEEDED   Yes [provider]  BELSOMRA 10 MG TABS Take 1 tablet by mouth at bedtime as needed.   Yes [provider]  Blood Glucose Monitoring Suppl (FIFTY50 GLUCOSE METER 2.0) w/Device KIT To check blood sugars TID or Use as instructed Include strips. Lancets, lancet device, control solution. batteries 04/11/19  Yes [provider]  clopidogrel  (PLAVIX ) 75 MG tablet Take 1 tablet (75 mg total) by mouth daily. 04/03/23  Yes Mansouraty, Aloha Raddle., MD  colchicine 0.6 MG tablet Take 0.6 mg by mouth daily. 03/04/21  Yes [provider]  Continuous Glucose Receiver (FREESTYLE LIBRE 3 READER) DEVI  10/28/23  Yes [provider]  Continuous Glucose Sensor (FREESTYLE LIBRE 3 SENSOR) MISC  08/15/23  Yes [provider]  Cyanocobalamin  (VITAMIN B-12 IJ) Inject 1,000 mg as directed See admin instructions. Every 30 days as needed   Yes [provider]  diclofenac  Sodium (VOLTAREN ) 1 % GEL Apply 2 g topically daily as needed (pain). 04/07/21  Yes [provider]  DULoxetine  (CYMBALTA ) 60 MG capsule Take 60 mg by mouth 2 (two) times daily.    Yes [provider]  fluticasone  (FLONASE ) 50 MCG/ACT nasal spray Place 1 spray into both nostrils daily. 10/28/21  Yes Sood, Vineet, MD  fluticasone  furoate-vilanterol (BREO ELLIPTA ) 200-25 MCG/ACT AEPB Inhale 1 puff into the lungs daily. 02/23/23  Yes Hope Almarie ORN, NP  furosemide  (LASIX ) 20 MG tablet Take 20 mg by mouth every Tuesday, Thursday, and Saturday at 6 PM.  03/21/13  Yes [provider]  gabapentin  (NEURONTIN ) 300 MG capsule Take 600 mg by mouth 3 (three) times daily. 10/18/14  Yes [provider]  glucose blood test strip 1 each by Misc.(Non-Drug; Combo Route) route daily. 05/26/17  Yes [provider]  HUMULIN R  100 UNIT/ML injection Use as sliding scale up to 10 units tid with meals 08/13/23  Yes [provider]  Insulin  Glargine (TOUJEO  SOLOSTAR McIn[place]) Inject 70 Units into the skin daily with supper. 04/28/21  Yes [provider]  insulin  NPH-regular Human (NOVOLIN 70/30) (70-30) 100 UNIT/ML injection Please inject 25 units twice daily before breakfast and dinner. 08/26/17  Yes Caleen Qualia, MD  Insulin  Pen Needle (FIFTY50 PEN NEEDLES) 32G X 4 MM MISC 1 each by Misc.(Non-Drug; Combo  Route) route daily. 04/18/19  Yes [provider]  ipratropium-albuterol  (DUONEB) 0.5-2.5 (3) MG/3ML SOLN USE 1 VIAL IN NEBULIZER 4 TIMES DAILY 05/22/21  Yes Sood, Vineet, MD  Lancets (ONETOUCH ULTRASOFT) lancets USE 1 LANCET TO CHECK GLUCOSE IN THE MORNING BEFORE BREAKFAST 03/28/18  Yes [provider]  losartan  (COZAAR ) 25 MG tablet Take 25 mg by mouth at bedtime.   Yes [provider]  meclizine  (ANTIVERT ) 12.5 MG tablet Take 1 tablet (12.5 mg total) by mouth 3 (three) times daily as needed for dizziness. 07/23/21  Yes Patt Alm Macho, MD  methocarbamol (ROBAXIN) 500 MG tablet Take 500 mg by mouth 3 (three) times daily as needed for muscle spasms. 04/09/21  Yes [provider]  metoprolol  succinate (TOPROL -XL) 100 MG 24 hr tablet Take 100 mg by mouth daily. 07/01/22  Yes [provider]  nystatin cream (MYCOSTATIN) Apply topically 2 (two) times daily as needed. 06/02/23  Yes [provider]  OZEMPIC, 0.25 OR 0.5 MG/DOSE, 2 MG/3ML SOPN Inject 0.25 mg as directed once a week. 09/24/23  Yes [provider]  pantoprazole  (PROTONIX ) 20 MG tablet TAKE 1 TABLET BY MOUTH TWICE DAILY BEFORE A MEAL 10/21/23  Yes Mansouraty, Gabriel Jr., MD  potassium chloride  SA (K-DUR,KLOR-CON ) 20 MEQ tablet Take 1 tablet (20 mEq total) by mouth 2 (two) times daily. 05/03/15  Yes Timmy Maude SAUNDERS, MD  primidone  (MYSOLINE ) 50 MG tablet Take 100 mg by mouth 2 (two) times daily. 01/26/17  Yes [provider]  sildenafil (VIAGRA) 100 MG tablet Take 100 mg by mouth at bedtime as needed.   Yes [provider]  spironolactone (ALDACTONE) 25 MG tablet Take 25 mg by mouth every Monday, Wednesday, and Friday. 08/18/19  Yes [provider]  tamsulosin  (FLOMAX ) 0.4 MG CAPS capsule Take 1 capsule (0.4 mg total) by mouth daily after breakfast. 12/06/15  Yes Ennever, Maude SAUNDERS, MD  tiZANidine  (ZANAFLEX ) 4 MG tablet Take 4 mg by mouth 3 (three) times daily as  needed for muscle spasms.  10/04/14  Yes [provider]  traMADol  (ULTRAM ) 50 MG tablet Take 50 mg by mouth daily as needed for moderate pain.  09/08/13  Yes [provider]  varenicline  (CHANTIX ) 1 MG tablet Take 1 tablet by mouth twice daily 04/04/24  Yes Burns, Jennifer E, NP  zolpidem  (AMBIEN  CR) 12.5 MG CR tablet Take 12.5 mg by mouth at bedtime. 05/12/21  Yes [provider]    Allergies as of 04/25/2024   (No Known Allergies)    Family History  Problem Relation Age of Onset   Heart disease Mother    Diabetes Father    Stroke Brother    Stroke Paternal Uncle  Diabetes Sister    Heart attack Brother    Heart attack Brother     Social History   Socioeconomic History   Marital status: Married    Spouse name: Not on file   Number of children: Not on file   Years of education: Not on file   Highest education level: Not on file  Occupational History   Occupation: diabled    Comment: as of early 70s  Tobacco Use   Smoking status: Every Day    Current packs/day: 1.50    Average packs/day: 0.5 packs/day for 35.1 years (18.7 ttl pk-yrs)    Types: Cigarettes    Start date: 08/05/1983    Last attempt to quit: 08/15/2017   Smokeless tobacco: Never  Vaping Use   Vaping status: Never Used  Substance and Sexual Activity   Alcohol use: No    Alcohol/week: 0.0 standard drinks of alcohol   Drug use: No   Sexual activity: Not Currently  Other Topics Concern   Not on file  Social History Narrative   Not on file   Social Drivers of Health   Financial Resource Strain: Not on file  Food Insecurity: Low Risk  (03/02/2023)   Received from [facility]   Hunger Vital Sign    Within the past 12 months, you worried that your food would run out before you got money to buy more: Never true    Within the past 12 months, the food you bought just didn't last and you didn't have money to get more. : Never true  Transportation Needs: No Transportation Needs  (07/17/2022)   PRAPARE - Administrator, Civil Service (Medical): No    Lack of Transportation (Non-Medical): No  Physical Activity: Not on file  Stress: Not on file  Social Connections: Not on file  Intimate Partner Violence: Not At Risk (07/17/2022)   Humiliation, Afraid, Rape, and Kick questionnaire    Fear of Current or Ex-Partner: No    Emotionally Abused: No    Physically Abused: No    Sexually Abused: No    Review of Systems   See HPI, otherwise negative ROS  Physical Exam: BP 111/75   Pulse 76   Temp 98.4 F (36.9 C)   Ht 5' 6 (1.676 m)   Wt 230 lb 6.4 oz (104.5 kg)   BMI 37.19 kg/m  General:   Alert, somewhat frail pleasant and cooperative in NAD; accompanied by his spouse. Neck:  Supple; no masses or thyromegaly. No significant cervical adenopathy. Lungs:  Clear throughout to auscultation.   No wheezes, crackles, or rhonchi. No acute distress. Heart:  Regular rate and rhythm; no murmurs, clicks, rubs,  or gallops. Abdomen: Non-distended, normal bowel sounds.  Soft and nontender without appreciable mass or hepatosplenomegaly.    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  History of lesions of the liver consistent with hepatic cyst, hemangioma.  Recommendations were made for repeat EGD to look at the duodenum, etc. repeat imaging of liver and pancreaticobiliary tree with MRI as feasible. Standing iron  deficiency anemia likely multifactorial in etiology.  I am not convinced that all of his anemia secondary to GI blood loss.  He has had a thorough evaluation thus far.  Recommendations:  As discussed from recommendations last year we will go  ahead and set up upper endoscopy to look your duodenum or small intestine to see if  there are any polyps left.     The risks, benefits, limitations, alternatives and imponderables have been reviewed with the patient. Potential for esophageal dilation, biopsy, etc. have also been reviewed.  Questions have been answered. All parties agreeable.   Afterwards, we will schedule an MRI of the liver and pancreas if feasible due to your defibrillator being implanted  Hold Plavix  x 5 days.  Hold other medications per protocol  If future colonoscopy is not needed recommended  Further recommendations to follow.            Notice: This dictation was prepared with Dragon dictation along with smaller phrase technology. Any transcriptional errors that result from this process are unintentional and may not be corrected upon review.

## 2024-05-03 ENCOUNTER — Other Ambulatory Visit: Payer: Self-pay | Admitting: Oncology

## 2024-05-10 NOTE — Telephone Encounter (Signed)
-----   Message from Alm Needle, MD sent at 05/09/2024  4:17 PM EDT ----- Let Mr. Spinney know that his echo shows ejection fraction is about the same 40 to 45% compared to last echo. ----- Message ----- From: Delmar Herbert Cheek Results In [id number] Sent: 05/09/2024   3:52 PM EDT To: Alm CHRISTELLA Needle, MD

## 2024-05-10 NOTE — Telephone Encounter (Signed)
 Communicated test results to the patient via phone, addressed all questions thoroughly, and confirmed patient understanding through verbal acknowledgment.

## 2024-05-12 ENCOUNTER — Encounter (HOSPITAL_COMMUNITY)
Admission: RE | Admit: 2024-05-12 | Discharge: 2024-05-12 | Disposition: A | Discharge status: Home / Self Care | Source: Ambulatory Visit | Attending: Internal Medicine | Admitting: Internal Medicine

## 2024-05-12 ENCOUNTER — Other Ambulatory Visit: Payer: Self-pay

## 2024-05-12 ENCOUNTER — Encounter (HOSPITAL_COMMUNITY): Payer: Self-pay

## 2024-05-12 NOTE — Progress Notes (Signed)
 Spoke w/ via phone for pre-op interview Elvyn Krohn and his wife Johnston      COVID test -----patient states asymptomatic no test needed Arrive at 0600 NPO after MN NO Solid Food.  Clear liquids from MN    Med rec completed Medications to take morning of surgery amlodipine, colchicine, cymbalta , gabapentin , flomax , primidone , tramadol  use inhalers Diabetic medication none  GLP1 agonist last dose: 10/18025 GLP1 instructions:  Patient instructed no nail polish to be worn day of surgery Patient instructed to bring photo id and insurance card day of surgery Patient aware to have Driver (ride ) / caregiver    for 24 hours after surgery -  Patient Special Instructions ----- Pre-Op special Instructions -----  Patient verbalized understanding of instructions that were given at this phone interview. Patient denies chest pain, sob, fever, cough at the interview.

## 2024-05-16 NOTE — Anesthesia Preprocedure Evaluation (Signed)
 Anesthesia Evaluation  Patient identified by MRN, date of birth, ID band Patient awake    Reviewed: Allergy & Precautions, NPO status , Patient's Chart, lab work & pertinent test results  History of Anesthesia Complications Negative for: history of anesthetic complications  Airway Mallampati: II  TM Distance: >3 FB Neck ROM: Full    Dental  (+) Edentulous Upper, Edentulous Lower   Pulmonary shortness of breath, sleep apnea and Continuous Positive Airway Pressure Ventilation , COPD,  COPD inhaler and oxygen  dependent, Current Smoker and Patient abstained from smoking., former smoker   Pulmonary exam normal breath sounds clear to auscultation       Cardiovascular hypertension, Pt. on medications + CAD, + Peripheral Vascular Disease and +CHF  Normal cardiovascular exam+ Cardiac Defibrillator  Rhythm:Regular Rate:Normal   '22 TTE - mild concentric left ventricular hypertrophy. There is mild global hypokinesis of the left ventricle. EF 45-50%. The ascending aorta diameter measures at 4 cm by leading edge method, 3.7cm inner edge to inner edge.     Neuro/Psych CVA, Residual Symptoms  negative psych ROS   GI/Hepatic ,GERD  Medicated and Controlled,,Liver lesion   Endo/Other  diabetes, Type 2, Insulin  Dependent    Renal/GU Renal disease     Musculoskeletal  (+) Arthritis ,    Abdominal   Peds  Hematology  On plavix     Anesthesia Other Findings   Reproductive/Obstetrics                              Anesthesia Physical Anesthesia Plan  ASA: 4  Anesthesia Plan: General   Post-op Pain Management: Minimal or no pain anticipated   Induction: Intravenous  PONV Risk Score and Plan: Propofol  infusion  Airway Management Planned: Nasal Cannula and Natural Airway  Additional Equipment: None  Intra-op Plan:   Post-operative Plan:   Informed Consent: I have reviewed the patients History and  Physical, chart, labs and discussed the procedure including the risks, benefits and alternatives for the proposed anesthesia with the patient or authorized representative who has indicated his/her understanding and acceptance.     Dental advisory given  Plan Discussed with: CRNA  Anesthesia Plan Comments:          Anesthesia Quick Evaluation

## 2024-05-17 ENCOUNTER — Telehealth: Payer: Self-pay | Admitting: *Deleted

## 2024-05-17 ENCOUNTER — Ambulatory Visit (HOSPITAL_BASED_OUTPATIENT_CLINIC_OR_DEPARTMENT_OTHER): Admitting: Anesthesiology

## 2024-05-17 ENCOUNTER — Encounter (HOSPITAL_COMMUNITY): Payer: Self-pay | Admitting: Internal Medicine

## 2024-05-17 ENCOUNTER — Encounter (HOSPITAL_COMMUNITY)
Admission: RE | Disposition: A | Discharge status: Home / Self Care | Payer: Self-pay | Source: Home / Self Care | Attending: Internal Medicine

## 2024-05-17 ENCOUNTER — Other Ambulatory Visit: Payer: Self-pay

## 2024-05-17 ENCOUNTER — Ambulatory Visit (HOSPITAL_COMMUNITY): Admitting: Anesthesiology

## 2024-05-17 ENCOUNTER — Ambulatory Visit (HOSPITAL_COMMUNITY)
Admission: RE | Admit: 2024-05-17 | Discharge: 2024-05-17 | Disposition: A | Discharge status: Home / Self Care | Attending: Internal Medicine | Admitting: Internal Medicine

## 2024-05-17 DIAGNOSIS — I509 Heart failure, unspecified: Secondary | ICD-10-CM | POA: Diagnosis not present

## 2024-05-17 DIAGNOSIS — G4733 Obstructive sleep apnea (adult) (pediatric): Secondary | ICD-10-CM | POA: Insufficient documentation

## 2024-05-17 DIAGNOSIS — K8689 Other specified diseases of pancreas: Secondary | ICD-10-CM

## 2024-05-17 DIAGNOSIS — E1151 Type 2 diabetes mellitus with diabetic peripheral angiopathy without gangrene: Secondary | ICD-10-CM | POA: Insufficient documentation

## 2024-05-17 DIAGNOSIS — I110 Hypertensive heart disease with heart failure: Secondary | ICD-10-CM | POA: Diagnosis not present

## 2024-05-17 DIAGNOSIS — K317 Polyp of stomach and duodenum: Secondary | ICD-10-CM

## 2024-05-17 DIAGNOSIS — Z87891 Personal history of nicotine dependence: Secondary | ICD-10-CM | POA: Diagnosis not present

## 2024-05-17 DIAGNOSIS — Z9981 Dependence on supplemental oxygen: Secondary | ICD-10-CM | POA: Insufficient documentation

## 2024-05-17 DIAGNOSIS — D509 Iron deficiency anemia, unspecified: Secondary | ICD-10-CM | POA: Insufficient documentation

## 2024-05-17 DIAGNOSIS — I2510 Atherosclerotic heart disease of native coronary artery without angina pectoris: Secondary | ICD-10-CM

## 2024-05-17 DIAGNOSIS — K219 Gastro-esophageal reflux disease without esophagitis: Secondary | ICD-10-CM | POA: Insufficient documentation

## 2024-05-17 DIAGNOSIS — J449 Chronic obstructive pulmonary disease, unspecified: Secondary | ICD-10-CM | POA: Diagnosis not present

## 2024-05-17 DIAGNOSIS — Z7951 Long term (current) use of inhaled steroids: Secondary | ICD-10-CM | POA: Diagnosis not present

## 2024-05-17 DIAGNOSIS — M1990 Unspecified osteoarthritis, unspecified site: Secondary | ICD-10-CM | POA: Insufficient documentation

## 2024-05-17 DIAGNOSIS — Z794 Long term (current) use of insulin: Secondary | ICD-10-CM | POA: Insufficient documentation

## 2024-05-17 DIAGNOSIS — Z95810 Presence of automatic (implantable) cardiac defibrillator: Secondary | ICD-10-CM | POA: Diagnosis not present

## 2024-05-17 DIAGNOSIS — Z7985 Long-term (current) use of injectable non-insulin antidiabetic drugs: Secondary | ICD-10-CM | POA: Diagnosis not present

## 2024-05-17 DIAGNOSIS — D132 Benign neoplasm of duodenum: Secondary | ICD-10-CM | POA: Diagnosis not present

## 2024-05-17 DIAGNOSIS — Z7902 Long term (current) use of antithrombotics/antiplatelets: Secondary | ICD-10-CM | POA: Insufficient documentation

## 2024-05-17 DIAGNOSIS — Z09 Encounter for follow-up examination after completed treatment for conditions other than malignant neoplasm: Secondary | ICD-10-CM | POA: Insufficient documentation

## 2024-05-17 DIAGNOSIS — I6990 Unspecified sequelae of unspecified cerebrovascular disease: Secondary | ICD-10-CM | POA: Insufficient documentation

## 2024-05-17 DIAGNOSIS — E119 Type 2 diabetes mellitus without complications: Secondary | ICD-10-CM

## 2024-05-17 DIAGNOSIS — Z13810 Encounter for screening for upper gastrointestinal disorder: Secondary | ICD-10-CM

## 2024-05-17 HISTORY — PX: ESOPHAGOGASTRODUODENOSCOPY: SHX5428

## 2024-05-17 LAB — GLUCOSE, CAPILLARY
Glucose-Capillary: 97 mg/dL (ref 70–99)
Glucose-Capillary: 58 mg/dL — ABNORMAL LOW (ref 70–99)
Glucose-Capillary: 68 mg/dL — ABNORMAL LOW (ref 70–99)
Glucose-Capillary: 70 mg/dL (ref 70–99)

## 2024-05-17 SURGERY — EGD (ESOPHAGOGASTRODUODENOSCOPY)
Anesthesia: General

## 2024-05-17 MED ORDER — LACTATED RINGERS IV SOLN: INTRAVENOUS | Status: DC

## 2024-05-17 MED ORDER — PROPOFOL 10 MG/ML IV BOLUS
INTRAVENOUS | Status: DC | PRN
  Administered 2024-05-17: 100 mg via INTRAVENOUS
  Administered 2024-05-17: 150 ug/kg/min via INTRAVENOUS

## 2024-05-17 MED ORDER — LIDOCAINE HCL (CARDIAC) PF 100 MG/5ML IV SOSY
PREFILLED_SYRINGE | INTRAVENOUS | Status: DC | PRN
  Administered 2024-05-17: 50 mg via INTRAVENOUS

## 2024-05-17 NOTE — Telephone Encounter (Signed)
 Spoke with Ludie in MRI in secure chat. the clip is conditional but can be scanned. Looking at device information in epic looks like he can be scanned. He reports patient just needs to bring his card for his loop recorder the day of MRI to verify it can be scanned.   Called pt and spoke with spouse (on dpr). She is aware of MRI appt details and given central scheduling number to reschedule if this does not work. Also aware he needs to take his card for his loop recorder with him.

## 2024-05-17 NOTE — Interval H&P Note (Signed)
 History and Physical Interval Note:  05/17/2024 8:56 AM  James Zuniga  has presented today for surgery, with the diagnosis of hx: duodenal polyp.  The various methods of treatment have been discussed with the patient and family. After consideration of risks, benefits and other options for treatment, the patient has consented to  Procedure(s) with comments: EGD (ESOPHAGOGASTRODUODENOSCOPY) (N/A) - 7:30 am, asa 3 as a surgical intervention.  The patient's history has been reviewed, patient examined, no change in status, stable for surgery.  I have reviewed the patient's chart and labs.  Questions were answered to the patient's satisfaction.     [name]   no change.  Patient here for surveillance EGD to look at the duodenum.  MRI/MRCP of the pancreaticobiliary tree in the near future.  Denies dysphagia.  The risks, benefits, limitations, alternatives and imponderables have been reviewed with the patient. Questions have been answered. All parties are agreeable.

## 2024-05-17 NOTE — Interval H&P Note (Signed)
 History and Physical Interval Note:  05/17/2024 7:27 AM  James Zuniga  has presented today for surgery, with the diagnosis of hx: duodenal polyp.  The various methods of treatment have been discussed with the patient and family. After consideration of risks, benefits and other options for treatment, the patient has consented to  Procedure(s) with comments: EGD (ESOPHAGOGASTRODUODENOSCOPY) (N/A) - 7:30 am, asa 3 as a surgical intervention.  The patient's history has been reviewed, patient examined, no change in status, stable for surgery.  I have reviewed the patient's chart and labs.  Questions were answered to the patient's satisfaction.     Jatavian Calica  No change.  LFTs good except for minimally elevated alkaline phosphatase at 127.  Surveillance EGD today to look at duodenal mucosa, excetra.  MRI/MRCP versus pancreatic protocol CT to follow  The risks, benefits, limitations, alternatives and imponderables have been reviewed with the patient. Potential for esophageal dilation, biopsy, etc. have also been reviewed.  Questions have been answered. All parties agreeable.

## 2024-05-17 NOTE — Progress Notes (Addendum)
 Patient under the assumption that he has a defibrillator.  Cardiology note shows a Horticulturist, Commercial. Contacted [facility] Dr Alm Needle and patient does not have a defibrillator but a loop recorder.  It is okay to proceed with EGD per Dr Larina nurse Georganna.

## 2024-05-17 NOTE — Telephone Encounter (Signed)
-----   Message from Lamar Hollingshead sent at 05/17/2024  9:40 AM EST -----  Lorn, patient needs either an MRI/MRCP to look at dilated pancreatic duct   Or a pancreatic protocol CT.  If he cannot have an MR, lets get a pancreatic protocol CT.  We need to check with radiology.  Because he has a loop recorder in place and 1 [employer] 360 clip on a polypectomy site in his duodenum today.    Those 2 foreign appliances need to be checked to make sure he can have an MRI.  Ask radiology.Thanks.

## 2024-05-17 NOTE — Anesthesia Postprocedure Evaluation (Signed)
 Anesthesia Post Note  Patient: James Zuniga  Procedure(s) Performed: EGD (ESOPHAGOGASTRODUODENOSCOPY)  Patient location during evaluation: Phase II Anesthesia Type: General Level of consciousness: awake and alert Pain management: pain level controlled Vital Signs Assessment: post-procedure vital signs reviewed and stable Respiratory status: spontaneous breathing, nonlabored ventilation and respiratory function stable Cardiovascular status: stable Anesthetic complications: no   There were no known notable events for this encounter.   Last Vitals:  Vitals:   05/17/24 0938 05/17/24 0951  BP: 93/68 102/79  Pulse: 68   Resp: 19   Temp: 36.5 C   SpO2: 100%     Last Pain:  Vitals:   05/17/24 0951  TempSrc:   PainSc: 6                  Arnesia Vincelette L Dawid Dupriest

## 2024-05-17 NOTE — Progress Notes (Signed)
 CBG in preop was 97.  After procedure in postop, cbg qas 70 at 0950.   Rechecked at 1000 and it was 58.  After drinking a 8oz cola and eating a few PB crackers, cbg up to 68 at 1015.  Pt asymptomatic and Dr. Shaaron was notified.  Pt stated he planned to go get something to eat after leaving.  Pt discharged home and left with remaining PB crackers.  Wife was informed of cbg and was going to see that pt went to eat.  Pt stated he felt fine and had no symptoms and cbg was coming up after po intake.

## 2024-05-17 NOTE — Discharge Instructions (Addendum)
 EGD Discharge instructions Please read the instructions outlined below and refer to this sheet in the next few weeks. These discharge instructions provide you with general information on caring for yourself after you leave the hospital. Your doctor may also give you specific instructions. While your treatment has been planned according to the most current medical practices available, unavoidable complications occasionally occur. If you have any problems or questions after discharge, please call your doctor. ACTIVITY You may resume your regular activity but move at a slower pace for the next 24 hours.  Take frequent rest periods for the next 24 hours.  Walking will help expel (get rid of) the air and reduce the bloated feeling in your abdomen.  No driving for 24 hours (because of the anesthesia (medicine) used during the test).  You may shower.  Do not sign any important legal documents or operate any machinery for 24 hours (because of the anesthesia used during the test).  NUTRITION Drink plenty of fluids.  You may resume your normal diet.  Begin with a light meal and progress to your normal diet.  Avoid alcoholic beverages for 24 hours or as instructed by your caregiver.  MEDICATIONS You may resume your normal medications unless your caregiver tells you otherwise.  WHAT YOU CAN EXPECT TODAY You may experience abdominal discomfort such as a feeling of fullness or "gas" pains.  FOLLOW-UP Your doctor will discuss the results of your test with you.  SEEK IMMEDIATE MEDICAL ATTENTION IF ANY OF THE FOLLOWING OCCUR: Excessive nausea (feeling sick to your stomach) and/or vomiting.  Severe abdominal pain and distention (swelling).  Trouble swallowing.  Temperature over 101 F (37.8 C).  Rectal bleeding or vomiting of blood.       3 small polyps found in your small intestine (duodenum).  They were removed.    We will schedule either an MRI/MRCP of your pancreas or a pancreatic protocol CT  depending on feasibility with loop recorder and clip.  Further recommendations to follow pending review of pathology report    At patient request, I called James Zuniga at [phone]-reviewed findings and recommendations.

## 2024-05-17 NOTE — Transfer of Care (Signed)
 Immediate Anesthesia Transfer of Care Note  Patient: James Zuniga  Procedure(s) Performed: EGD (ESOPHAGOGASTRODUODENOSCOPY)  Patient Location: Short Stay  Anesthesia Type:General  Level of Consciousness: drowsy, patient cooperative, and responds to stimulation  Airway & Oxygen  Therapy: Patient Spontanous Breathing  Post-op Assessment: Report given to RN and Post -op Vital signs reviewed and stable  Post vital signs: Reviewed and stable  Last Vitals:  Vitals Value Taken Time  BP 93/68 05/17/24 09:38  Temp 36.5 C 05/17/24 09:38  Pulse 68 05/17/24 09:38  Resp 19 05/17/24 09:38  SpO2 100 % 05/17/24 09:38    Last Pain:  Vitals:   05/17/24 9061  TempSrc: Oral  PainSc: Asleep      Patients Stated Pain Goal: 6 (05/17/24 9347)  Complications: No notable events documented.

## 2024-05-17 NOTE — Op Note (Signed)
 [facility] Patient Name: James Zuniga Procedure Date: 05/17/2024 6:53 AM MRN: [id number] Date of Birth: 1956/06/14 Attending MD: Lamar Ozell Hollingshead , MD, [id number] CSN: [id number] Age: 68 Admit Type: Outpatient Procedure:                Upper GI endoscopy Indications:              Surveillance procedure, Surveillance for malignancy                            due to personal history of premalignant                            condition(history of duodenal adenomas) Providers:                Lamar Ozell Hollingshead, MD, Crystal Page, Dorcas Lenis, Technician Referring MD:              Medicines:                Propofol  per Anesthesia Complications:            No immediate complications. Estimated Blood Loss:     Estimated blood loss was minimal. Procedure:                Pre-Anesthesia Assessment:                           - Prior to the procedure, a History and Physical                            was performed, and patient medications and                            allergies were reviewed. The patient's tolerance of                            previous anesthesia was also reviewed. The risks                            and benefits of the procedure and the sedation                            options and risks were discussed with the patient.                            All questions were answered, and informed consent                            was obtained. Prior Anticoagulants: The patient has                            taken no anticoagulant or antiplatelet agents. ASA  Grade Assessment: III - A patient with severe                            systemic disease. After reviewing the risks and                            benefits, the patient was deemed in satisfactory                            condition to undergo the procedure.                           After obtaining informed consent, the endoscope was                             passed under direct vision. Throughout the                            procedure, the patient's blood pressure, pulse, and                            oxygen  saturations were monitored continuously. The                            [id number] ([id number]) Upper was introduced through                            the mouth, and advanced to the second part of                            duodenum. The upper GI endoscopy was accomplished                            without difficulty. The patient tolerated the                            procedure well. Scope In: 9:18:17 AM Scope Out: 9:34:34 AM Total Procedure Duration: 0 hours 16 minutes 17 seconds  Findings:      The examined esophagus was normal.      The entire examined stomach was normal.      (1) 8 mm adenomatous polypoid lesion in the distal part of the second       portion of the duodenum. It was hot snared with 10 mm snare and       polypectomy site was sealed with a 360 clip there were (3) 2 to 3 mm       sessile polyps in the proximal second portion of the duodenum. They were       cold snared and cold biopsy removed. Impression:               - Normal esophagus.                           - Normal stomach.                           -  Duodenal polyps?removed as described Moderate Sedation:      Moderate (conscious) sedation was personally administered by an       anesthesia professional. The following parameters were monitored: oxygen        saturation, heart rate, blood pressure, respiratory rate, EKG, adequacy       of pulmonary ventilation, and response to care. Recommendation:           - Patient has a contact number available for                            emergencies. The signs and symptoms of potential                            delayed complications were discussed with the                            patient. Return to normal activities tomorrow.                            Written discharge instructions were provided to the                             patient.                           - Advance diet as tolerated. Follow-up on                            pathology. Proceed with MRI/MRCP to follow-up on                            abnormal pancreas conditional on loop recorder and                            360 clip placed today. If he is not a candidate for                            MRI we will proceed with a pancreatic protocol CT.                           Further recommendations to follow. Procedure Code(s):        --- Professional ---                           [phone], Esophagogastroduodenoscopy, flexible,                            transoral; diagnostic, including collection of                            specimen(s) by brushing or washing, when performed                            (separate procedure) Diagnosis Code(s):        --- Professional ---  Z87.19, Personal history of other diseases of the                            digestive system CPT copyright 2022 [employer]. All rights reserved. The codes documented in this report are preliminary and upon coder review may  be revised to meet current compliance requirements. Lamar HERO. Luann Aspinwall, MD Lamar Ozell Hollingshead, MD 05/17/2024 2:09:38 PM This report has been signed electronically. Number of Addenda: 0

## 2024-05-18 LAB — SURGICAL PATHOLOGY

## 2024-05-19 ENCOUNTER — Ambulatory Visit: Payer: Self-pay | Admitting: Internal Medicine

## 2024-05-19 ENCOUNTER — Encounter (HOSPITAL_COMMUNITY): Payer: Self-pay | Admitting: Internal Medicine

## 2024-05-25 ENCOUNTER — Ambulatory Visit (HOSPITAL_BASED_OUTPATIENT_CLINIC_OR_DEPARTMENT_OTHER): Admitting: Pulmonary Disease

## 2024-05-26 ENCOUNTER — Other Ambulatory Visit (HOSPITAL_COMMUNITY)

## 2024-05-31 ENCOUNTER — Other Ambulatory Visit: Payer: Self-pay | Admitting: Internal Medicine

## 2024-05-31 ENCOUNTER — Ambulatory Visit (HOSPITAL_COMMUNITY)
Admission: RE | Admit: 2024-05-31 | Discharge: 2024-05-31 | Disposition: A | Discharge status: Home / Self Care | Source: Ambulatory Visit | Attending: Internal Medicine | Admitting: Internal Medicine

## 2024-05-31 DIAGNOSIS — K8689 Other specified diseases of pancreas: Secondary | ICD-10-CM

## 2024-05-31 MED ORDER — GADOBUTROL 1 MMOL/ML IV SOLN
10.0000 mL | Freq: Once | INTRAVENOUS | Status: AC | PRN
  Administered 2024-05-31: 10 mL via INTRAVENOUS

## 2024-06-06 ENCOUNTER — Encounter: Payer: Self-pay | Admitting: Oncology

## 2024-06-12 ENCOUNTER — Ambulatory Visit: Payer: Self-pay | Admitting: Internal Medicine

## 2024-07-03 ENCOUNTER — Other Ambulatory Visit: Payer: Self-pay | Admitting: Primary Care

## 2024-07-04 ENCOUNTER — Inpatient Hospital Stay: Attending: Hematology

## 2024-07-04 DIAGNOSIS — D649 Anemia, unspecified: Secondary | ICD-10-CM

## 2024-07-04 DIAGNOSIS — D631 Anemia in chronic kidney disease: Secondary | ICD-10-CM | POA: Insufficient documentation

## 2024-07-04 DIAGNOSIS — N189 Chronic kidney disease, unspecified: Secondary | ICD-10-CM | POA: Diagnosis present

## 2024-07-04 DIAGNOSIS — D509 Iron deficiency anemia, unspecified: Secondary | ICD-10-CM | POA: Diagnosis present

## 2024-07-04 DIAGNOSIS — D472 Monoclonal gammopathy: Secondary | ICD-10-CM | POA: Insufficient documentation

## 2024-07-04 LAB — COMPREHENSIVE METABOLIC PANEL WITH GFR
ALT: 15 U/L (ref 0–44)
AST: 18 U/L (ref 15–41)
Albumin: 3.9 g/dL (ref 3.5–5.0)
Alkaline Phosphatase: 144 U/L — ABNORMAL HIGH (ref 38–126)
Anion gap: 12 (ref 5–15)
BUN: 9 mg/dL (ref 8–23)
CO2: 25 mmol/L (ref 22–32)
Calcium: 10.5 mg/dL — ABNORMAL HIGH (ref 8.9–10.3)
Chloride: 101 mmol/L (ref 98–111)
Creatinine, Ser: 1.18 mg/dL (ref 0.61–1.24)
GFR, Estimated: >60 mL/min
Glucose, Bld: 190 mg/dL — ABNORMAL HIGH (ref 70–99)
Potassium: 3.9 mmol/L (ref 3.5–5.1)
Sodium: 137 mmol/L (ref 135–145)
Total Bilirubin: 0.3 mg/dL (ref 0.0–1.2)
Total Protein: 8.2 g/dL — ABNORMAL HIGH (ref 6.5–8.1)

## 2024-07-04 LAB — CBC WITH DIFFERENTIAL/PLATELET
Abs Immature Granulocytes: 0.02 K/uL (ref 0.00–0.07)
Basophils Absolute: 0 K/uL (ref 0.0–0.1)
Basophils Relative: 1 %
Eosinophils Absolute: 0.2 K/uL (ref 0.0–0.5)
Eosinophils Relative: 2 %
HCT: 32.8 % — ABNORMAL LOW (ref 39.0–52.0)
Hemoglobin: 9.7 g/dL — ABNORMAL LOW (ref 13.0–17.0)
Immature Granulocytes: 0 %
Lymphocytes Relative: 19 %
Lymphs Abs: 1.6 K/uL (ref 0.7–4.0)
MCH: 23.3 pg — ABNORMAL LOW (ref 26.0–34.0)
MCHC: 29.6 g/dL — ABNORMAL LOW (ref 30.0–36.0)
MCV: 78.8 fL — ABNORMAL LOW (ref 80.0–100.0)
Monocytes Absolute: 0.4 K/uL (ref 0.1–1.0)
Monocytes Relative: 5 %
Neutro Abs: 6.1 K/uL (ref 1.7–7.7)
Neutrophils Relative %: 73 %
Platelets: 347 K/uL (ref 150–400)
RBC: 4.16 MIL/uL — ABNORMAL LOW (ref 4.22–5.81)
RDW: 17.9 % — ABNORMAL HIGH (ref 11.5–15.5)
WBC: 8.3 K/uL (ref 4.0–10.5)
nRBC: 0 % (ref 0.0–0.2)

## 2024-07-04 LAB — IRON AND TIBC
Iron: 30 ug/dL — ABNORMAL LOW (ref 45–182)
Saturation Ratios: 10 % — ABNORMAL LOW (ref 17.9–39.5)
TIBC: 294 ug/dL (ref 250–450)
UIBC: 265 ug/dL

## 2024-07-04 LAB — FERRITIN: Ferritin: 67 ng/mL (ref 24–336)

## 2024-07-05 LAB — KAPPA/LAMBDA LIGHT CHAINS
Kappa free light chain: 21.5 mg/L — ABNORMAL HIGH (ref 3.3–19.4)
Kappa, lambda light chain ratio: 0.96 (ref 0.26–1.65)
Lambda free light chains: 22.5 mg/L (ref 5.7–26.3)

## 2024-07-10 LAB — PROTEIN ELECTROPHORESIS, SERUM
A/G Ratio: 0.7 (ref 0.7–1.7)
Albumin ELP: 3.1 g/dL (ref 2.9–4.4)
Alpha-1-Globulin: 0.3 g/dL (ref 0.0–0.4)
Alpha-2-Globulin: 1.2 g/dL — ABNORMAL HIGH (ref 0.4–1.0)
Beta Globulin: 1 g/dL (ref 0.7–1.3)
Gamma Globulin: 1.9 g/dL — ABNORMAL HIGH (ref 0.4–1.8)
Globulin, Total: 4.4 g/dL — ABNORMAL HIGH (ref 2.2–3.9)
M-Spike, %: 1.5 g/dL — ABNORMAL HIGH
Total Protein ELP: 7.5 g/dL (ref 6.0–8.5)

## 2024-07-11 ENCOUNTER — Inpatient Hospital Stay (HOSPITAL_BASED_OUTPATIENT_CLINIC_OR_DEPARTMENT_OTHER): Admitting: Oncology

## 2024-07-11 VITALS — BP 116/71 | HR 81 | Temp 97.2°F | Resp 18 | Ht 66.0 in | Wt 229.0 lb

## 2024-07-11 DIAGNOSIS — N189 Chronic kidney disease, unspecified: Secondary | ICD-10-CM | POA: Diagnosis not present

## 2024-07-11 DIAGNOSIS — D649 Anemia, unspecified: Secondary | ICD-10-CM | POA: Diagnosis not present

## 2024-07-11 DIAGNOSIS — D472 Monoclonal gammopathy: Secondary | ICD-10-CM

## 2024-07-11 NOTE — Assessment & Plan Note (Addendum)
 No recurrent infections. MM labs from 07/04/2024 are stable. Skeletal survey 2/25 with no lytic lesions. No 'CRAB' features. Will repeat MM labs in 6 months (due in June 2026)

## 2024-07-11 NOTE — Progress Notes (Unsigned)
 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  Denies BRBPR/melena.  He's on ASA and Plavix .  He is unable to tolerate iron  tablets. He has received multiple iron  infusions most recently given INFeD  on 04/06/2024. Most recent labs showed iron  saturation 10%, ferritin 67 hemoglobin 9.7. Iron  saturations are still low at 10% and I would recommend additional IV iron  to get his saturations closer to 30%.  We discussed 1 dose of INFeD  now and 1 in 6 weeks.  Reports improvement of energy levels following infusions. RTC in 3 months with labs and see NP.   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 MM labs from 07/04/2024 are stable. Skeletal survey 2/25 with no lytic lesions. No 'CRAB' features. Will repeat MM labs in 6 months (due in June 2026)  Orders Placed This Encounter  Procedures   CBC with Differential    Standing Status:   Future    Expected Date:   01/09/2025    Expiration Date:   04/09/2025   Comprehensive metabolic panel    Standing Status:   Future    Expected Date:   01/09/2025    Expiration Date:   04/09/2025   Ferritin    Standing Status:   Future    Expected Date:   01/09/2025    Expiration Date:   04/09/2025   Iron  and TIBC ([facility] ONLY)    Standing Status:   Future    Expected Date:   01/09/2025    Expiration Date:   04/09/2025   Kappa/lambda light chains    Standing Status:   Future    Expected Date:   01/09/2025    Expiration Date:   04/09/2025   Protein electrophoresis, serum    Standing Status:   Future    Expected Date:   01/09/2025    Expiration Date:   04/09/2025    INTERVAL HISTORY: Patient returns for follow-up for smoldering myeloma and normocytic anemia.  He was just recently evaluated for his multiple myeloma without evidence of crab criteria.  He was found to be slightly iron  deficient and given  1 g INFeD  on 5.  He has received multiple infusions in the past.  He is on aspirin  and Plavix .  He denies any bleeding, bright red blood per rectum, melena or hematochezia.  Reports increased energy following iron  infusions for about 6 weeks and then a decline.  Appetite is 75% energy levels are 25%. He has more shortness of breath and chest pain as well.  Has occasional dizziness and chronic numbness and burning in his hands and feet and to the back of his left leg.  He denies any bleeding that he can see.  He continues Plavix  and aspirin .    Most recent colonoscopy from 11/06/2022 showed one 3 mm polyp in the cecum with benign pathology.  EGD showed a nonobstructing Schatzki ring, small hiatal hernia, small duodenal polyp and a large polypoid lesion.  All pathology was benign and negative for intestinal metaplasia or malignancy.  Patient saw GI for upper endoscopy on 05/17/2024 which showed normal esophagus, stomach with 1 duodenal polyp which was removed.  Pathology was negative for malignancy or dysplasia.  We reviewed CBC, ferritin, iron  panel, kappa lambda light chains and protein electrophoresis.  SUMMARY OF HEMATOLOGIC HISTORY: Oncology History Overview Note  Normocytic anemia: - Anemia from blood loss and CKD. - Colonoscopy (08/26/2017)  normal. - Small bowel endoscopy (05/24/2018): 1 gastric polyp, 1 nonbleeding angiectasia in duodenum treated with APC.  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  Normal duodenum, first second and third parts. - He was receiving intermittent Venofer  infusions, last 1 on 09/08/2021. - He was also receiving B12 injections, last 1 on 05/25/2022. - BMBX (10/06/2022): Hypercellular marrow (60%) involved by plasma cell neoplasm, 9% plasma cells by manual aspirate differential and 10% by CD138 IHC.  No evidence of morphologic dysplasia.  No ring sideroblasts. - Myeloma FISH panel: t(11;14) consistent with standard risk.  Monosomy 13. - Chromosome  analysis: 46, XY[20]   Chronic iron  deficiency anemia     CBC    Component Value Date/Time   WBC 8.3 07/04/2024 0923   RBC 4.16 (L) 07/04/2024 0923   HGB 9.7 (L) 07/04/2024 0923   HGB 12.1 (L) 11/25/2021 1426   HGB 15.2 12/18/2016 0804   HCT 32.8 (L) 07/04/2024 0923   HCT 46.5 12/18/2016 0804   PLT 347 07/04/2024 0923   PLT 285 11/25/2021 1426   PLT 291 12/18/2016 0804   MCV 78.8 (L) 07/04/2024 0923   MCV 91 12/18/2016 0804   MCH 23.3 (L) 07/04/2024 0923   MCHC 29.6 (L) 07/04/2024 0923   RDW 17.9 (H) 07/04/2024 0923   RDW 14.7 12/18/2016 0804   LYMPHSABS 1.6 07/04/2024 0923   LYMPHSABS 2.0 12/18/2016 0804   MONOABS 0.4 07/04/2024 0923   EOSABS 0.2 07/04/2024 0923   EOSABS 0.2 12/18/2016 0804   BASOSABS 0.0 07/04/2024 0923   BASOSABS 0.0 12/18/2016 0804       Latest Ref Rng & Units 07/04/2024    9:23 AM 12/13/2023   12:51 PM 08/17/2023   12:59 PM  CMP  Glucose 70 - 99 mg/dL 809  815  698   BUN 8 - 23 mg/dL 9  11  9    Creatinine 0.61 - 1.24 mg/dL 8.81  8.68  8.70   Sodium 135 - 145 mmol/L 137  135  134   Potassium 3.5 - 5.1 mmol/L 3.9  3.9  4.0   Chloride 98 - 111 mmol/L 101  101  100   CO2 22 - 32 mmol/L 25  24  25    Calcium  8.9 - 10.3 mg/dL 89.4  9.8  9.9   Total Protein 6.5 - 8.1 g/dL 8.2  8.2  7.7   Total Bilirubin 0.0 - 1.2 mg/dL 0.3  0.4  0.4   Alkaline Phos 38 - 126 U/L 144  119  125   AST 15 - 41 U/L 18  18  19    ALT 0 - 44 U/L 15  15  17       Lab Results  Component Value Date   FERRITIN 67 07/04/2024   VITAMINB12 898 07/17/2022    Vitals:   07/11/24 1008  BP: 116/71  Pulse: 81  Resp: 18  Temp: (!) 97.2 F (36.2 C)  SpO2: 100%     Review of System:  Review of Systems  Constitutional:  Positive for malaise/fatigue.  Respiratory:  Positive for cough and shortness of breath.   Cardiovascular:  Positive for chest pain.  Neurological:  Positive for dizziness, tingling and sensory change.    Physical Exam: Physical Exam Constitutional:       Appearance: Normal appearance.  HENT:     Head: Normocephalic and atraumatic.  Eyes:     Pupils: Pupils are equal, round, and reactive to light.  Cardiovascular:     Rate  and Rhythm: Normal rate and regular rhythm.     Heart sounds: Normal heart sounds. No murmur heard. Pulmonary:     Effort: Pulmonary effort is normal.     Breath sounds: Normal breath sounds. No wheezing.  Abdominal:     General: Bowel sounds are normal. There is no distension.     Palpations: Abdomen is soft.     Tenderness: There is no abdominal tenderness.  Musculoskeletal:        General: Normal range of motion.     Cervical back: Normal range of motion.  Skin:    General: Skin is warm and dry.     Findings: No rash.  Neurological:     Mental Status: He is alert and oriented to person, place, and time.     Gait: Gait is intact.  Psychiatric:        Mood and Affect: Mood and affect normal.        Cognition and Memory: Memory normal.        Judgment: Judgment normal.      I spent 20 minutes dedicated to the care of this patient (face-to-face and non-face-to-face) on the date of the encounter to include what is described in the assessment and plan.,  Delon Hope, NP 07/12/2024 7:54 AM "

## 2024-07-11 NOTE — Assessment & Plan Note (Addendum)
 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  Denies BRBPR/melena.  He's on ASA and Plavix .  He is unable to tolerate iron  tablets. He has received multiple iron  infusions most recently given INFeD  on 04/06/2024. Most recent labs showed iron  saturation 10%, ferritin 67 hemoglobin 9.7. Iron  saturations are still low at 10% and I would recommend additional IV iron  to get his saturations closer to 30%.  We discussed 1 dose of INFeD  now and 1 in 6 weeks.  Reports improvement of energy levels following infusions. RTC in 3 months with labs and see NP.

## 2024-07-12 ENCOUNTER — Encounter: Payer: Self-pay | Admitting: Oncology

## 2024-07-14 ENCOUNTER — Encounter (HOSPITAL_BASED_OUTPATIENT_CLINIC_OR_DEPARTMENT_OTHER): Payer: Self-pay | Admitting: Pulmonary Disease

## 2024-07-14 ENCOUNTER — Ambulatory Visit (INDEPENDENT_AMBULATORY_CARE_PROVIDER_SITE_OTHER): Admitting: Pulmonary Disease

## 2024-07-14 VITALS — BP 104/65 | HR 87 | Ht 66.0 in | Wt 232.0 lb

## 2024-07-14 DIAGNOSIS — F17210 Nicotine dependence, cigarettes, uncomplicated: Secondary | ICD-10-CM

## 2024-07-14 DIAGNOSIS — J439 Emphysema, unspecified: Secondary | ICD-10-CM | POA: Diagnosis not present

## 2024-07-14 DIAGNOSIS — G4733 Obstructive sleep apnea (adult) (pediatric): Secondary | ICD-10-CM | POA: Diagnosis not present

## 2024-07-14 NOTE — Progress Notes (Signed)
 "  Subjective:    Patient ID: James Zuniga, male    DOB: 18-Jun-1956, 69 y.o.   MRN: [id number]   [phone]  man from [facility] for FU of OSA & COPD  former smoker quit in 2019  -CPAP 11 cm     PMH : hypertension,  chronic systolic heart failure, cardiomyopathy,  CVA with residual LT weakness diabetes type 2, gout, hyperlipidemia, phonic iron  deficiency anemia, obesity  MGUS      DME :  [facility] [facility] [employer] [facility] in [place]      Discussed the use of AI scribe software for clinical note transcription with the patient, who gave verbal consent to proceed.  History of Present Illness James Zuniga is a 69 year old male with mild emphysema, OSA, and chronic systolic heart failure who presents for follow-up. He was referred by his primary doctor, Santana, for a sleep study.  He no longer has leg fluid buildup that was present at the last visit and is taking diuretics three days a week.  His breathing is not too good, with marked fatigue walking short distances such as from bedroom to bathroom, and wheezing that improves somewhat with Breo. He smokes about one pack every three days and wants to quit due to concern for his lungs and heart.  For OSA, he has significant CPAP equipment problems. A nasal mask did not work and his current full face mask leaks air, is noisy and uncomfortable for him and his wife. He has not yet received replacement CPAP equipment. His wife notes loud snoring. He has had multiple prior sleep studies and is familiar with the process.     Significant tests/ events reviewed   PFT 01/27/13 >> FEV1 2.50 (84%), FEV1% 83, TLC 4.06 (61%), DLCO 60%, no BD PFT 04/02/21 >> FEV1 2.19 (78%), FEV1% 77, TLC 5.34 (80%), DLCO 76% PSG 04/21/19 >> AHI 15.5, SpO2 low 83% CPAP titration 05/17/19 >> CPAP 11 cm H2O CT chest 03/02/13 >> mild paraseptal emphysema, 4 mm RUL nodule CT chest 07/26/15 >> stable RUL nodule  Review of Systems  neg for any significant sore throat,  dysphagia, itching, sneezing, nasal congestion or excess/ purulent secretions, fever, chills, sweats, unintended wt loss, pleuritic or exertional cp, hempoptysis, orthopnea pnd or change in chronic leg swelling. Also denies presyncope, palpitations, heartburn, abdominal pain, nausea, vomiting, diarrhea or change in bowel or urinary habits, dysuria,hematuria, rash, arthralgias, visual complaints, headache, numbness weakness or ataxia.      Objective:   Physical Exam  Gen. Pleasant, obese, in no distress ENT - no lesions, no post nasal drip Neck: No JVD, no thyromegaly, no carotid bruits Lungs: no use of accessory muscles, no dullness to percussion, decreased without rales or rhonchi  Cardiovascular: Rhythm regular, heart sounds  normal, no murmurs or gallops, no peripheral edema Musculoskeletal: No deformities, no cyanosis or clubbing , no tremors   CPAP DL reviewed >> good compliance, 11 cm, large leak ++       Assessment & Plan:   Assessment and Plan Assessment & Plan Emphysema Mild emphysema with worsening dyspnea, particularly upon exertion. Reports increased fatigue with minimal activity. Continues to smoke, which exacerbates respiratory symptoms. Breo inhaler provides some relief. - Encouraged smoking cessation and discussed strategies to reduce smoking frequency. - Continue Breo inhaler as needed.  Obstructive sleep apnea Current CPAP mask issues. Previous nasal mask was ineffective, and current full face mask is not fitting properly, causing air leaks and noise. Cold air setting is  preferred. Awaiting a new machine in February. - Scheduled a sleep study to assess current sleep apnea and mask fit. - Will coordinate with sleep lab to find a better fitting mask during the sleep study. - Will plan for a new CPAP machine prescription post-sleep study results.  Chronic systolic heart failure Previously managed with diuretics three times a week. No current fluid retention in legs,  indicating good control of fluid status. - Continue current diuretic regimen three times a week.     "

## 2024-07-14 NOTE — Patient Instructions (Signed)
" °  VISIT SUMMARY: James Zuniga, a 69 year old male with mild emphysema, obstructive sleep apnea (OSA), and chronic systolic heart failure, came in for a follow-up visit. He reported worsening breathing and fatigue, issues with his CPAP equipment, and a desire to quit smoking. His leg fluid buildup has resolved with his current diuretic regimen.  YOUR PLAN: -EMPHYSEMA: Emphysema is a lung condition that causes shortness of breath. Your symptoms have worsened, especially when you exert yourself. You are encouraged to quit smoking to help improve your breathing. Continue using the Breo inhaler as needed to help with your symptoms.  -OBSTRUCTIVE SLEEP APNEA: Obstructive sleep apnea is a condition where your breathing stops and starts during sleep. You are having issues with your current CPAP mask, which is not fitting properly and causing discomfort. A sleep study has been scheduled to assess your current condition and find a better fitting mask. A new CPAP machine will be prescribed based on the sleep study results.  -CHRONIC SYSTOLIC HEART FAILURE: Chronic systolic heart failure is a condition where the heart does not pump blood as well as it should. Your fluid status is well controlled with your current diuretic regimen, and you should continue taking the diuretics three times a week as prescribed.  INSTRUCTIONS: Please follow up with the sleep study as scheduled. Continue your current medications and inhaler as discussed. If you have any new or worsening symptoms, contact our office.                      Contains text generated by Abridge.                                 Contains text generated by Abridge.   "

## 2024-07-18 ENCOUNTER — Inpatient Hospital Stay: Attending: Hematology

## 2024-07-18 VITALS — BP 104/70 | HR 74 | Temp 96.4°F | Resp 18

## 2024-07-18 DIAGNOSIS — D509 Iron deficiency anemia, unspecified: Secondary | ICD-10-CM | POA: Diagnosis present

## 2024-07-18 DIAGNOSIS — D649 Anemia, unspecified: Secondary | ICD-10-CM

## 2024-07-18 MED ORDER — SODIUM CHLORIDE 0.9 % IV SOLN: Freq: Once | INTRAVENOUS | Status: AC

## 2024-07-18 MED ORDER — FAMOTIDINE IN NACL 20-0.9 MG/50ML-% IV SOLN
20.0000 mg | Freq: Once | INTRAVENOUS | Status: AC
  Administered 2024-07-18: 20 mg via INTRAVENOUS
  Filled 2024-07-18: qty 50

## 2024-07-18 MED ORDER — ACETAMINOPHEN 325 MG PO TABS
650.0000 mg | ORAL_TABLET | Freq: Once | ORAL | Status: AC
  Administered 2024-07-18: 650 mg via ORAL
  Filled 2024-07-18: qty 2

## 2024-07-18 MED ORDER — SODIUM CHLORIDE 0.9 % IV SOLN
1000.0000 mg | Freq: Once | INTRAVENOUS | Status: AC
  Administered 2024-07-18: 1000 mg via INTRAVENOUS
  Filled 2024-07-18: qty 20

## 2024-07-18 MED ORDER — CYANOCOBALAMIN 1000 MCG/ML IJ SOLN
1000.0000 ug | Freq: Once | INTRAMUSCULAR | Status: AC
  Administered 2024-07-18: 1000 ug via INTRAMUSCULAR
  Filled 2024-07-18: qty 1

## 2024-07-18 MED ORDER — METHYLPREDNISOLONE SODIUM SUCC 125 MG IJ SOLR
125.0000 mg | Freq: Once | INTRAMUSCULAR | Status: AC
  Administered 2024-07-18: 125 mg via INTRAVENOUS
  Filled 2024-07-18: qty 2

## 2024-07-18 MED ORDER — CETIRIZINE HCL 10 MG/ML IV SOLN
10.0000 mg | Freq: Once | INTRAVENOUS | Status: AC
  Administered 2024-07-18: 10 mg via INTRAVENOUS
  Filled 2024-07-18: qty 1

## 2024-07-18 MED ORDER — SODIUM CHLORIDE 0.9 % IV SOLN: INTRAVENOUS | Status: DC

## 2024-07-18 NOTE — Patient Instructions (Signed)
 [facility] [place] - A DEPT OF [facility]  Discharge Instructions: Thank you for choosing [facility] to provide your oncology and hematology care.  If you have a lab appointment with the Cancer Center - please note that after April 8th, 2024, all labs will be drawn in the cancer center.  You do not have to check in or register with the main entrance as you have in the past but will complete your check-in in the cancer center.  Wear comfortable clothing and clothing appropriate for easy access to any Portacath or PICC line.   We strive to give you quality time with your provider. You may need to reschedule your appointment if you arrive late (15 or more minutes).  Arriving late affects you and other patients whose appointments are after yours.  Also, if you miss three or more appointments without notifying the office, you may be dismissed from the clinic at the provider's discretion.      For prescription refill requests, have your pharmacy contact our office and allow 72 hours for refills to be completed.    Today you received the following chemotherapy and/or immunotherapy agents Infed. Iron Dextran Injection What is this medication? IRON DEXTRAN (EYE ern DEX tran) treats low levels of iron in your body. Iron is a mineral that plays an important role in making red blood cells, which carry oxygen from your lungs to the rest of your body. This medicine may be used for other purposes; ask your health care provider or pharmacist if you have questions. COMMON BRAND NAME(S): Dexferrum, INFeD What should I tell my care team before I take this medication? They need to know if you have any of these conditions: Anemia not caused by low iron levels Heart disease High levels of iron in the blood Kidney disease Liver disease An unusual or allergic reaction to iron, other medications, foods, dyes, or preservatives Pregnant or trying to get  pregnant Breastfeeding How should I use this medication? This medication is injected into a vein or a muscle. It is given by your care team in a hospital or clinic setting. Talk to your care team about the use of this medication in children. While it may be prescribed for children as young as 4 months for selected conditions, precautions do apply. Overdosage: If you think you have taken too much of this medicine contact a poison control center or emergency room at once. NOTE: This medicine is only for you. Do not share this medicine with others. What if I miss a dose? Keep appointments for follow-up doses. It is important not to miss your dose. Call your care team if you are unable to keep an appointment. What may interact with this medication? Do not take this medication with any of the following: Deferoxamine Dimercaprol Other iron products This medication may also interact with the following: Chloramphenicol Deferasirox This list may not describe all possible interactions. Give your health care provider a list of all the medicines, herbs, non-prescription drugs, or dietary supplements you use. Also tell them if you smoke, drink alcohol, or use illegal drugs. Some items may interact with your medicine. What should I watch for while using this medication? Visit your care team for regular checks on your progress. Tell your care team if your symptoms do not start to get better or if they get worse. You may need blood work while taking this medication. You may need to eat more foods that contain  iron. Talk to your care team. Foods that contain iron include whole grains/cereals, dried fruits, beans, peas, leafy green vegetables, and organ meats (liver, kidney). Long-term use of this medication may increase your risk of some cancers. Talk to your care team about your risk of cancer. What side effects may I notice from receiving this medication? Side effects that you should report to your care  team as soon as possible: Allergic reactions--skin rash, itching, hives, swelling of the face, lips, tongue, or throat Low blood pressure--dizziness, feeling faint or lightheaded, blurry vision Shortness of breath Side effects that usually do not require medical attention (report to your care team if they continue or are bothersome): Flushing Headache Joint pain Muscle pain Nausea Pain, redness, or irritation at injection site This list may not describe all possible side effects. Call your doctor for medical advice about side effects. You may report side effects to FDA at 1-800-FDA-1088. Where should I keep my medication? This medication is given in a hospital or clinic. It will not be stored at home. NOTE: This sheet is a summary. It may not cover all possible information. If you have questions about this medicine, talk to your doctor, pharmacist, or health care provider.  2024 Elsevier/Gold Standard (2023-02-17 00:00:00)      To help prevent nausea and vomiting after your treatment, we encourage you to take your nausea medication as directed.  BELOW ARE SYMPTOMS THAT SHOULD BE REPORTED IMMEDIATELY: *FEVER GREATER THAN 100.4 F (38 C) OR HIGHER *CHILLS OR SWEATING *NAUSEA AND VOMITING THAT IS NOT CONTROLLED WITH YOUR NAUSEA MEDICATION *UNUSUAL SHORTNESS OF BREATH *UNUSUAL BRUISING OR BLEEDING *URINARY PROBLEMS (pain or burning when urinating, or frequent urination) *BOWEL PROBLEMS (unusual diarrhea, constipation, pain near the anus) TENDERNESS IN MOUTH AND THROAT WITH OR WITHOUT PRESENCE OF ULCERS (sore throat, sores in mouth, or a toothache) UNUSUAL RASH, SWELLING OR PAIN  UNUSUAL VAGINAL DISCHARGE OR ITCHING   Items with * indicate a potential emergency and should be followed up as soon as possible or go to the Emergency Department if any problems should occur.  Please show the CHEMOTHERAPY ALERT CARD or IMMUNOTHERAPY ALERT CARD at check-in to the Emergency Department and triage  nurse.  Should you have questions after your visit or need to cancel or reschedule your appointment, please contact [facility] [place] - A DEPT OF [facility]igh[facility] Brideg[facility] [phone]  and follow the prompts.  Office hours are 8:00 a.m. to 4:30 p.m. Monday - Friday. Please note that voicemails left after 4:00 p.m. may not be returned until the following business day.  We are closed weekends and major holidays. You have access to a nurse at all times for urgent questions. Please call the main number to the clinic [phone] and follow the prompts.  For any non-urgent questions, you may also contact your provider using MyChart. We now offer e-Visits for anyone 19 and older to request care online for non-urgent symptoms. For details visit [email or web address].   Also download the MyChart app! Go to the app store, search "MyChart", open the app, select [place], and log in with your MyChart username and password.

## 2024-07-18 NOTE — Progress Notes (Signed)
 Patient presents today for Infed  infusion. Blood pressure 88/59 on arrival. Patient asymptomatic. Message send to J.Burns NP to report blood pressure. Orders received to proceed with iron .   Infed  given today per MD orders. Tolerated infusion without adverse affects. Vital signs stable. No complaints at this time. Discharged from clinic ambulatory in stable condition. Alert and oriented x 3. F/U with [facility] as scheduled.

## 2024-07-22 ENCOUNTER — Other Ambulatory Visit: Payer: Self-pay | Admitting: Oncology

## 2024-07-31 ENCOUNTER — Other Ambulatory Visit: Payer: Self-pay

## 2024-07-31 DIAGNOSIS — D500 Iron deficiency anemia secondary to blood loss (chronic): Secondary | ICD-10-CM

## 2024-07-31 DIAGNOSIS — C9000 Multiple myeloma not having achieved remission: Secondary | ICD-10-CM

## 2024-07-31 DIAGNOSIS — E538 Deficiency of other specified B group vitamins: Secondary | ICD-10-CM

## 2024-07-31 MED ORDER — VARENICLINE TARTRATE 1 MG PO TABS: 1.0000 mg | ORAL_TABLET | Freq: Two times a day (BID) | ORAL | 0 refills | Status: AC

## 2024-07-31 NOTE — Telephone Encounter (Signed)
 Refill request for Varenicline  1 mg tablet.  Chart checked and refilled.

## 2024-08-17 ENCOUNTER — Inpatient Hospital Stay (HOSPITAL_COMMUNITY): Admission: EM | Admit: 2024-08-17 | Source: Home / Self Care | Admitting: Internal Medicine

## 2024-08-17 ENCOUNTER — Other Ambulatory Visit: Payer: Self-pay

## 2024-08-17 ENCOUNTER — Inpatient Hospital Stay (HOSPITAL_COMMUNITY)

## 2024-08-17 ENCOUNTER — Emergency Department (HOSPITAL_COMMUNITY)

## 2024-08-17 ENCOUNTER — Encounter (HOSPITAL_COMMUNITY): Payer: Self-pay | Admitting: Emergency Medicine

## 2024-08-17 DIAGNOSIS — A419 Sepsis, unspecified organism: Principal | ICD-10-CM | POA: Diagnosis present

## 2024-08-17 HISTORY — DX: Multiple myeloma not having achieved remission: C90.00

## 2024-08-17 LAB — I-STAT CHEM 8, ED
BUN: 41 mg/dL — ABNORMAL HIGH (ref 8–23)
Calcium, Ion: 1.13 mmol/L — ABNORMAL LOW (ref 1.15–1.40)
Chloride: 101 mmol/L (ref 98–111)
Creatinine, Ser: 4 mg/dL — ABNORMAL HIGH (ref 0.61–1.24)
Glucose, Bld: 168 mg/dL — ABNORMAL HIGH (ref 70–99)
HCT: 35 % — ABNORMAL LOW (ref 39.0–52.0)
Hemoglobin: 11.9 g/dL — ABNORMAL LOW (ref 13.0–17.0)
Potassium: 4.5 mmol/L (ref 3.5–5.1)
Sodium: 134 mmol/L — ABNORMAL LOW (ref 135–145)
TCO2: 23 mmol/L (ref 22–32)

## 2024-08-17 LAB — MAGNESIUM: Magnesium: 1.6 mg/dL — ABNORMAL LOW (ref 1.7–2.4)

## 2024-08-17 LAB — BASIC METABOLIC PANEL WITH GFR
Anion gap: 13 (ref 5–15)
BUN: 29 mg/dL — ABNORMAL HIGH (ref 8–23)
CO2: 20 mmol/L — ABNORMAL LOW (ref 22–32)
Calcium: 9 mg/dL (ref 8.9–10.3)
Chloride: 105 mmol/L (ref 98–111)
Creatinine, Ser: 2.92 mg/dL — ABNORMAL HIGH (ref 0.61–1.24)
GFR, Estimated: 23 mL/min — ABNORMAL LOW
Glucose, Bld: 108 mg/dL — ABNORMAL HIGH (ref 70–99)
Potassium: 3.7 mmol/L (ref 3.5–5.1)
Sodium: 138 mmol/L (ref 135–145)

## 2024-08-17 LAB — TYPE AND SCREEN
ABO/RH(D): O POS
Antibody Screen: NEGATIVE

## 2024-08-17 LAB — CBC WITH DIFFERENTIAL/PLATELET
Abs Immature Granulocytes: 0.11 10*3/uL — ABNORMAL HIGH (ref 0.00–0.07)
Basophils Absolute: 0 10*3/uL (ref 0.0–0.1)
Basophils Relative: 0 %
Eosinophils Absolute: 0 10*3/uL (ref 0.0–0.5)
Eosinophils Relative: 0 %
HCT: 34.6 % — ABNORMAL LOW (ref 39.0–52.0)
Hemoglobin: 10.2 g/dL — ABNORMAL LOW (ref 13.0–17.0)
Immature Granulocytes: 1 %
Lymphocytes Relative: 6 %
Lymphs Abs: 1 10*3/uL (ref 0.7–4.0)
MCH: 23.9 pg — ABNORMAL LOW (ref 26.0–34.0)
MCHC: 29.5 g/dL — ABNORMAL LOW (ref 30.0–36.0)
MCV: 81 fL (ref 80.0–100.0)
Monocytes Absolute: 0.9 10*3/uL (ref 0.1–1.0)
Monocytes Relative: 6 %
Neutro Abs: 13.6 10*3/uL — ABNORMAL HIGH (ref 1.7–7.7)
Neutrophils Relative %: 87 %
Platelets: 329 10*3/uL (ref 150–400)
RBC: 4.27 MIL/uL (ref 4.22–5.81)
RDW: 19.4 % — ABNORMAL HIGH (ref 11.5–15.5)
WBC: 15.7 10*3/uL — ABNORMAL HIGH (ref 4.0–10.5)
nRBC: 0 % (ref 0.0–0.2)

## 2024-08-17 LAB — COMPREHENSIVE METABOLIC PANEL WITH GFR
ALT: 9 U/L (ref 0–44)
AST: 21 U/L (ref 15–41)
Albumin: 3.6 g/dL (ref 3.5–5.0)
Alkaline Phosphatase: 111 U/L (ref 38–126)
Anion gap: 18 — ABNORMAL HIGH (ref 5–15)
BUN: 33 mg/dL — ABNORMAL HIGH (ref 8–23)
CO2: 20 mmol/L — ABNORMAL LOW (ref 22–32)
Calcium: 10 mg/dL (ref 8.9–10.3)
Chloride: 95 mmol/L — ABNORMAL LOW (ref 98–111)
Creatinine, Ser: 3.71 mg/dL — ABNORMAL HIGH (ref 0.61–1.24)
GFR, Estimated: 17 mL/min — ABNORMAL LOW
Glucose, Bld: 166 mg/dL — ABNORMAL HIGH (ref 70–99)
Potassium: 4.5 mmol/L (ref 3.5–5.1)
Sodium: 133 mmol/L — ABNORMAL LOW (ref 135–145)
Total Bilirubin: 0.9 mg/dL (ref 0.0–1.2)
Total Protein: 8.4 g/dL — ABNORMAL HIGH (ref 6.5–8.1)

## 2024-08-17 LAB — URINALYSIS, W/ REFLEX TO CULTURE (INFECTION SUSPECTED)
Bilirubin Urine: NEGATIVE
Glucose, UA: NEGATIVE mg/dL
Ketones, ur: NEGATIVE mg/dL
Nitrite: NEGATIVE
Protein, ur: 100 mg/dL — AB
Specific Gravity, Urine: 1.012 (ref 1.005–1.030)
WBC, UA: >50 WBC/hpf (ref 0–5)
pH: 5 (ref 5.0–8.0)

## 2024-08-17 LAB — LIPASE, BLOOD: Lipase: 21 U/L (ref 11–51)

## 2024-08-17 LAB — PROTIME-INR
INR: 1.1 (ref 0.8–1.2)
Prothrombin Time: 15.3 s — ABNORMAL HIGH (ref 11.4–15.2)

## 2024-08-17 LAB — BLOOD GAS, VENOUS
Acid-base deficit: 1.4 mmol/L (ref 0.0–2.0)
Bicarbonate: 22.8 mmol/L (ref 20.0–28.0)
Drawn by: 61327
O2 Saturation: 72.7 %
Patient temperature: 39.9
pCO2, Ven: 41 mmHg — ABNORMAL LOW (ref 44–60)
pH, Ven: 7.37 (ref 7.25–7.43)
pO2, Ven: 54 mmHg — ABNORMAL HIGH (ref 32–45)

## 2024-08-17 LAB — RESP PANEL BY RT-PCR (RSV, FLU A&B, COVID)  RVPGX2
Influenza A by PCR: NEGATIVE
Influenza B by PCR: NEGATIVE
Resp Syncytial Virus by PCR: NEGATIVE
SARS Coronavirus 2 by RT PCR: NEGATIVE

## 2024-08-17 LAB — HEPARIN LEVEL (UNFRACTIONATED): Heparin Unfractionated: 0.18 [IU]/mL — ABNORMAL LOW (ref 0.30–0.70)

## 2024-08-17 LAB — TROPONIN T, HIGH SENSITIVITY: Troponin T High Sensitivity: 49 ng/L — ABNORMAL HIGH (ref 0–19)

## 2024-08-17 LAB — LACTIC ACID, PLASMA
Lactic Acid, Venous: 2.8 mmol/L (ref 0.5–1.9)
Lactic Acid, Venous: 1.9 mmol/L (ref 0.5–1.9)

## 2024-08-17 LAB — CBG MONITORING, ED: Glucose-Capillary: 136 mg/dL — ABNORMAL HIGH (ref 70–99)

## 2024-08-17 LAB — GLUCOSE, CAPILLARY
Glucose-Capillary: 119 mg/dL — ABNORMAL HIGH (ref 70–99)
Glucose-Capillary: 178 mg/dL — ABNORMAL HIGH (ref 70–99)

## 2024-08-17 LAB — MRSA NEXT GEN BY PCR, NASAL: MRSA by PCR Next Gen: NOT DETECTED

## 2024-08-17 MED ORDER — ONDANSETRON HCL 4 MG PO TABS: 4.0000 mg | ORAL_TABLET | Freq: Four times a day (QID) | ORAL | Status: AC | PRN

## 2024-08-17 MED ORDER — CLOPIDOGREL BISULFATE 75 MG PO TABS: 75.0000 mg | ORAL_TABLET | Freq: Every day | ORAL | Status: DC

## 2024-08-17 MED ORDER — AMIODARONE HCL IN DEXTROSE 360-4.14 MG/200ML-% IV SOLN
30.0000 mg/h | INTRAVENOUS | Status: AC
  Administered 2024-08-18: 60 mg/h via INTRAVENOUS
  Administered 2024-08-18 (×2): 30 mg/h via INTRAVENOUS
  Filled 2024-08-17 (×3): qty 200

## 2024-08-17 MED ORDER — CHLORHEXIDINE GLUCONATE CLOTH 2 % EX PADS
6.0000 | MEDICATED_PAD | Freq: Every day | CUTANEOUS | Status: AC
  Administered 2024-08-18: 6 via TOPICAL

## 2024-08-17 MED ORDER — ONDANSETRON HCL 4 MG/2ML IJ SOLN
4.0000 mg | Freq: Once | INTRAMUSCULAR | Status: AC
  Administered 2024-08-17: 4 mg via INTRAVENOUS
  Filled 2024-08-17: qty 2

## 2024-08-17 MED ORDER — ONDANSETRON HCL 4 MG/2ML IJ SOLN: 4.0000 mg | Freq: Four times a day (QID) | INTRAMUSCULAR | Status: AC | PRN

## 2024-08-17 MED ORDER — SODIUM CHLORIDE 0.9 % IV SOLN
2.0000 g | INTRAVENOUS | Status: AC
  Administered 2024-08-17 – 2024-08-18 (×2): 2 g via INTRAVENOUS
  Filled 2024-08-17 (×2): qty 20

## 2024-08-17 MED ORDER — FLUCONAZOLE 100 MG PO TABS: 200.0000 mg | ORAL_TABLET | Freq: Every day | ORAL | Status: DC

## 2024-08-17 MED ORDER — SODIUM CHLORIDE 0.9 % IV SOLN
2.0000 g | Freq: Once | INTRAVENOUS | Status: AC
  Administered 2024-08-17: 2 g via INTRAVENOUS
  Filled 2024-08-17: qty 20

## 2024-08-17 MED ORDER — HEPARIN SODIUM (PORCINE) 5000 UNIT/ML IJ SOLN
5000.0000 [IU] | Freq: Three times a day (TID) | INTRAMUSCULAR | Status: DC
  Administered 2024-08-17: 5000 [IU] via SUBCUTANEOUS
  Filled 2024-08-17: qty 1

## 2024-08-17 MED ORDER — VANCOMYCIN HCL 750 MG/150ML IV SOLN: 750.0000 mg | INTRAVENOUS | Status: DC

## 2024-08-17 MED ORDER — INSULIN ASPART 100 UNIT/ML IJ SOLN
0.0000 [IU] | Freq: Three times a day (TID) | INTRAMUSCULAR | Status: AC
  Administered 2024-08-18 (×2): 3 [IU] via SUBCUTANEOUS
  Filled 2024-08-17 (×2): qty 1

## 2024-08-17 MED ORDER — HEPARIN (PORCINE) 25000 UT/250ML-% IV SOLN
INTRAVENOUS | Status: AC
  Filled 2024-08-17: qty 250

## 2024-08-17 MED ORDER — AMIODARONE LOAD VIA INFUSION
150.0000 mg | Freq: Once | INTRAVENOUS | Status: AC
  Administered 2024-08-17: 150 mg via INTRAVENOUS
  Filled 2024-08-17: qty 83.34

## 2024-08-17 MED ORDER — POLYETHYLENE GLYCOL 3350 17 G PO PACK: 17.0000 g | PACK | Freq: Every day | ORAL | Status: AC | PRN

## 2024-08-17 MED ORDER — MELATONIN 3 MG PO TABS
3.0000 mg | ORAL_TABLET | Freq: Every day | ORAL | Status: AC
  Administered 2024-08-17 – 2024-08-18 (×2): 3 mg via ORAL
  Filled 2024-08-17 (×2): qty 1

## 2024-08-17 MED ORDER — VANCOMYCIN HCL 2000 MG/400ML IV SOLN
2000.0000 mg | Freq: Once | INTRAVENOUS | Status: AC
  Administered 2024-08-17: 2000 mg via INTRAVENOUS
  Filled 2024-08-17: qty 400

## 2024-08-17 MED ORDER — SODIUM CHLORIDE 0.9 % IV BOLUS
1000.0000 mL | Freq: Once | INTRAVENOUS | Status: AC
  Administered 2024-08-17: 1000 mL via INTRAVENOUS

## 2024-08-17 MED ORDER — HEPARIN (PORCINE) 25000 UT/250ML-% IV SOLN
1400.0000 [IU]/h | INTRAVENOUS | Status: AC
  Administered 2024-08-17: 1200 [IU]/h via INTRAVENOUS
  Administered 2024-08-18: 1350 [IU]/h via INTRAVENOUS
  Filled 2024-08-17: qty 250

## 2024-08-17 MED ORDER — INSULIN ASPART 100 UNIT/ML IJ SOLN: 0.0000 [IU] | Freq: Every day | INTRAMUSCULAR | Status: AC

## 2024-08-17 MED ORDER — LACTATED RINGERS IV SOLN: INTRAVENOUS | Status: AC

## 2024-08-17 MED ORDER — ACETAMINOPHEN 650 MG RE SUPP: 650.0000 mg | Freq: Four times a day (QID) | RECTAL | Status: DC | PRN

## 2024-08-17 MED ORDER — DILTIAZEM HCL 25 MG/5ML IV SOLN
25.0000 mg | Freq: Once | INTRAVENOUS | Status: AC
  Administered 2024-08-17: 25 mg via INTRAVENOUS
  Filled 2024-08-17: qty 5

## 2024-08-17 MED ORDER — ACETAMINOPHEN 325 MG PO TABS
650.0000 mg | ORAL_TABLET | Freq: Four times a day (QID) | ORAL | Status: DC | PRN
  Administered 2024-08-17 (×2): 650 mg via ORAL
  Filled 2024-08-17 (×2): qty 2

## 2024-08-17 MED ORDER — SODIUM CHLORIDE 0.9% FLUSH
10.0000 mL | Freq: Two times a day (BID) | INTRAVENOUS | Status: AC
  Administered 2024-08-17 – 2024-08-18 (×3): 10 mL

## 2024-08-17 MED ORDER — NOREPINEPHRINE 4 MG/250ML-% IV SOLN
0.0000 ug/min | INTRAVENOUS | Status: DC
  Administered 2024-08-17: 3 ug/min via INTRAVENOUS
  Filled 2024-08-17: qty 250

## 2024-08-17 MED ORDER — PANTOPRAZOLE SODIUM 40 MG IV SOLR
40.0000 mg | Freq: Once | INTRAVENOUS | Status: AC
  Administered 2024-08-17: 40 mg via INTRAVENOUS
  Filled 2024-08-17: qty 10

## 2024-08-17 MED ORDER — FLUCONAZOLE 100 MG PO TABS
100.0000 mg | ORAL_TABLET | Freq: Every day | ORAL | Status: AC
  Administered 2024-08-17 – 2024-08-18 (×2): 100 mg via ORAL
  Filled 2024-08-17 (×2): qty 1

## 2024-08-17 MED ORDER — FLUTICASONE FUROATE-VILANTEROL 200-25 MCG/ACT IN AEPB
1.0000 | INHALATION_SPRAY | Freq: Every day | RESPIRATORY_TRACT | Status: AC
  Administered 2024-08-18: 1 via RESPIRATORY_TRACT
  Filled 2024-08-17: qty 28

## 2024-08-17 MED ORDER — SODIUM CHLORIDE 0.9% FLUSH: 10.0000 mL | INTRAVENOUS | Status: AC | PRN

## 2024-08-17 MED ORDER — AMIODARONE HCL IN DEXTROSE 360-4.14 MG/200ML-% IV SOLN
INTRAVENOUS | Status: AC
  Filled 2024-08-17: qty 200

## 2024-08-17 MED ORDER — HEPARIN BOLUS VIA INFUSION
4000.0000 [IU] | Freq: Once | INTRAVENOUS | Status: AC
  Administered 2024-08-17: 4000 [IU] via INTRAVENOUS
  Filled 2024-08-17: qty 4000

## 2024-08-17 MED ORDER — ASPIRIN 81 MG PO TBEC: 81.0000 mg | DELAYED_RELEASE_TABLET | Freq: Every day | ORAL | Status: DC

## 2024-08-17 MED ORDER — AMIODARONE HCL IN DEXTROSE 360-4.14 MG/200ML-% IV SOLN
60.0000 mg/h | INTRAVENOUS | Status: AC
  Administered 2024-08-17: 60 mg/h via INTRAVENOUS
  Filled 2024-08-17: qty 200

## 2024-08-17 NOTE — Progress Notes (Signed)
 PHARMACY - ANTICOAGULATION CONSULT NOTE  Pharmacy Consult for Heparin  Indication: atrial fibrillation  Allergies[1]  Patient Measurements: Height: 5' 6 (167.6 cm) Weight: 103.3 kg (227 lb 11.8 oz) IBW/kg (Calculated) : 63.8 HEPARIN  DW (KG): 86.8  Vital Signs: Temp: 101.1 F (38.4 C) (02/05 1630) Temp Source: Oral (02/05 0808) BP: 80/64 (02/05 1630) Pulse Rate: 125 (02/05 1518)  Labs: Recent Labs    08/17/24 0830 08/17/24 0843 08/17/24 0844  HGB 10.2*  --  11.9*  HCT 34.6*  --  35.0*  PLT 329  --   --   LABPROT  --  15.3*  --   INR  --  1.1  --   CREATININE 3.71*  --  4.00*    Estimated Creatinine Clearance: 19.6 mL/min (A) (by C-G formula based on SCr of 4 mg/dL (H)).   Medical History: Past Medical History:  Diagnosis Date   Arthritis    all over (08/24/2017)   CHF (congestive heart failure) (HCC)    Chronic airway obstruction (HCC)    Chronic lower back pain    Coronary artery disease    Disorder of liver    spots on my liver; they've rechecked them; they haven't changed (08/24/2017)   Dyspnea    Esophageal reflux    Gout    on daily RX (08/24/2017)   Hyperlipidemia    Hypertension    Hypokalemia 11/2016   Hypotestosteronism 07/01/2013   Insomnia    Iron  deficiency anemia, unspecified 07/01/2013   Memory loss    Multiple myeloma (HCC)    On home oxygen  therapy    2L prn (08/24/2017)   OSA on CPAP    PVD (peripheral vascular disease)    Renal disorder    Stroke (HCC) 01/2017   left sided weakness remains (08/24/2017)   Type II diabetes mellitus (HCC)    Vitamin D  deficiency     Medications:  Medications Prior to Admission  Medication Sig Dispense Refill Last Dose/Taking   albuterol  (VENTOLIN  HFA) 108 (90 Base) MCG/ACT inhaler Inhale 2 puffs into the lungs every 6 (six) hours as needed for wheezing or shortness of breath. 18 g 3    allopurinol  (ZYLOPRIM ) 300 MG tablet Take 300 mg by mouth daily.      amLODipine (NORVASC) 5 MG tablet  Take 1 tablet by mouth daily.      aspirin  EC 81 MG tablet Take 81 mg by mouth daily. Swallow whole.      atorvastatin  (LIPITOR ) 80 MG tablet Take 1 tablet (80 mg total) by mouth every morning. 30 tablet 0    BD INSULIN  SYRINGE ULTRAFINE 31G X 5/16 0.5 ML MISC INJECT 1 EACH UNDER THE SKIN DAILY AS NEEDED      BELSOMRA 20 MG TABS Take 1 tablet by mouth at bedtime.      Blood Glucose Monitoring Suppl (FIFTY50 GLUCOSE METER 2.0) w/Device KIT To check blood sugars TID or Use as instructed Include strips. Lancets, lancet device, control solution. batteries      BREO ELLIPTA  200-25 MCG/ACT AEPB Inhale 1 puff by mouth once daily 60 each 2    clopidogrel  (PLAVIX ) 75 MG tablet Take 1 tablet (75 mg total) by mouth daily.      colchicine 0.6 MG tablet Take 0.6 mg by mouth daily.      Continuous Glucose Receiver (FREESTYLE LIBRE 3 READER) DEVI       Continuous Glucose Sensor (FREESTYLE LIBRE 3 SENSOR) MISC       Cyanocobalamin  (VITAMIN B-12 IJ) Inject  1,000 mg as directed See admin instructions. Every 30 days as needed      diclofenac  Sodium (VOLTAREN ) 1 % GEL Apply 2 g topically daily as needed (pain).      DULoxetine  (CYMBALTA ) 60 MG capsule Take 60 mg by mouth 2 (two) times daily.       fluticasone  (FLONASE ) 50 MCG/ACT nasal spray Place 1 spray into both nostrils daily. 16 g 2    furosemide  (LASIX ) 20 MG tablet Take 20 mg by mouth every Tuesday, Thursday, and Saturday at 6 PM.       gabapentin  (NEURONTIN ) 300 MG capsule Take 600 mg by mouth 3 (three) times daily.      glucose blood test strip 1 each by Misc.(Non-Drug; Combo Route) route daily.      HUMULIN R  100 UNIT/ML injection Use as sliding scale up to 10 units tid with meals      Insulin  Glargine (TOUJEO  SOLOSTAR [place]) Inject 70 Units into the skin daily with supper.      insulin  NPH-regular Human (NOVOLIN 70/30) (70-30) 100 UNIT/ML injection Please inject 25 units twice daily before breakfast and dinner. 10 mL 3    Insulin  Pen Needle (FIFTY50 PEN  NEEDLES) 32G X 4 MM MISC 1 each by Misc.(Non-Drug; Combo Route) route daily.      Ipratropium-Albuterol  (COMBIVENT) 20-100 MCG/ACT AERS respimat (20-100 mcg/actuat)      Lancets (ONETOUCH ULTRASOFT) lancets USE 1 LANCET TO CHECK GLUCOSE IN THE MORNING BEFORE BREAKFAST  11    losartan  (COZAAR ) 25 MG tablet Take 25 mg by mouth at bedtime.      meclizine  (ANTIVERT ) 12.5 MG tablet Take 1 tablet (12.5 mg total) by mouth 3 (three) times daily as needed for dizziness. 20 tablet 0    methocarbamol (ROBAXIN) 500 MG tablet Take 500 mg by mouth 3 (three) times daily as needed for muscle spasms.      metoprolol  succinate (TOPROL -XL) 100 MG 24 hr tablet Take 100 mg by mouth daily.      nystatin cream (MYCOSTATIN) Apply topically 2 (two) times daily as needed.      OZEMPIC, 0.25 OR 0.5 MG/DOSE, 2 MG/3ML SOPN Inject 0.25 mg as directed once a week.      pantoprazole  (PROTONIX ) 20 MG tablet TAKE 1 TABLET BY MOUTH TWICE DAILY BEFORE A MEAL 60 tablet 0    potassium chloride  SA (K-DUR,KLOR-CON ) 20 MEQ tablet Take 1 tablet (20 mEq total) by mouth 2 (two) times daily. 30 tablet 3    primidone  (MYSOLINE ) 50 MG tablet Take 100 mg by mouth 2 (two) times daily.      sildenafil (VIAGRA) 100 MG tablet Take 100 mg by mouth at bedtime as needed.      spironolactone (ALDACTONE) 25 MG tablet Take 25 mg by mouth every Monday, Wednesday, and Friday.      tamsulosin  (FLOMAX ) 0.4 MG CAPS capsule Take 1 capsule (0.4 mg total) by mouth daily after breakfast. 30 capsule 12    traMADol  (ULTRAM ) 50 MG tablet Take 50 mg by mouth daily as needed for moderate pain.       varenicline  (CHANTIX ) 1 MG tablet Take 1 tablet (1 mg total) by mouth 2 (two) times daily. 60 tablet 0     Assessment: Patient presented with severe sepsis. Antibiotics initiated and now patient has new onset afib. Not on any oral anticoagulants. Pharmacy asked to start heparin .  Goal of Therapy:  Heparin  level 0.3-0.7 units/ml Monitor platelets by anticoagulation  protocol: Yes   Plan:  Give 4000 units bolus x 1 Start heparin  infusion at 1200 units/hr Check anti-Xa level in ~6-8 hours and daily while on heparin  Continue to monitor H&H and platelets  Cowan Pilar, BS Pharm D, BCPS Clinical Pharmacist 08/17/2024,4:50 PM      [1] No Known Allergies

## 2024-08-17 NOTE — Sepsis Progress Note (Signed)
 Sepsis protocol is being followed by [facility].

## 2024-08-17 NOTE — ED Triage Notes (Signed)
 Pt wife states pt has been weak, vomiting, lethargic & c/o of left sided abd pain x 2 days. Also has a headache.

## 2024-08-17 NOTE — Progress Notes (Signed)
 EKG appears to be fib/flutter with rates in the 140s/150s.  Will start IV heparin  and amiodarone  for now.  IF HR can be lowered his BP may respond.  Will hold on pressors unless BP does not respond to lowering of HR.  A+Ox3. Appears comfortable  Harlene Bowl DO

## 2024-08-17 NOTE — ED Notes (Signed)
 MD notified pt temp has reach 100.4

## 2024-08-17 NOTE — Progress Notes (Addendum)
 Pharmacy Antibiotic Note  James Zuniga is a 69 y.o. male admitted on 08/17/2024 with sepsis.  Pharmacy has been consulted for Vancomycin  dosing. AKI with Scr 4, likely urinary source.   Plan: Vancomycin  2000 mg IV loading dose then 750mg  IV Q 48 hrs. Goal AUC 400-550. Expected AUC: 414 SCr used: 4 ceftriaxone  2g IV q24h F/U cxs and clinical progress Monitor V/S, labs and levels as indicated  Height: 5' 6 (167.6 cm) Weight: 103.3 kg (227 lb 11.8 oz) IBW/kg (Calculated) : 63.8  Temp (24hrs), Avg:99.9 F (37.7 C), Min:98.4 F (36.9 C), Max:101.1 F (38.4 C)  Recent Labs  Lab 08/17/24 0830 08/17/24 0843 08/17/24 0844 08/17/24 1356  WBC 15.7*  --   --   --   CREATININE 3.71*  --  4.00*  --   LATICACIDVEN  --  2.8*  --  1.9    Estimated Creatinine Clearance: 19.6 mL/min (A) (by C-G formula based on SCr of 4 mg/dL (H)).    Allergies[1]  Antimicrobials this admission: vancomycin  2/5 >>  ceftriaxone  2/5 >>   Microbiology results: 2/5 BCx: pending 2/5 UCx: pending   MRSA PCR:   Thank you for allowing pharmacy to be a part of this patients care.  Dorthy Hustead, BS Pharm D, BCPS Clinical Pharmacist 08/17/2024 4:43 PM     [1] No Known Allergies

## 2024-08-17 NOTE — H&P (Addendum)
 " History and Physical    James Zuniga FMW:[id number] DOB: 11-26-1955 DOA: 08/17/2024  I have briefly reviewed the patient's prior medical records in [facility] Link  PCP: Joshua Santana LITTIE, NP  Alva-pulmonology Dr. Fitzgerald-cardiology Rourk-gastroenterology Delon Hope, NP-oncology  Patient coming from: Home  Chief Complaint: [name] foul-smelling urine  HPI: James Zuniga is a 69 y.o. male with medical history significant of multiple myeloma, obstructive sleep apnea, emphysema, chronic systolic heart failure (on diuretics 3 times per week) who presents to the ER after a 2 to 3-day period of weakness, vomiting, fatigue, dark and foul-smelling urine. Wife states that patient's been weaker over the last 2 days and she has had to help carry him from place to place.  Denies any falls. Patient states that since he has been given fluid as well as antibiotics in the ER he is starting to feel better.  In the ER, his creatinine was found to be 3.71/4 from a baseline of around 1.2.  His lactic acid was elevated at 2.8.  His white blood cell count was elevated at 15.7.  His urine showed large leukocytes, many bacteria and greater than 50 WBCs with clumps.  Cultures were obtained-blood and urine.  He was given IV antibiotics.  CT scan was done that showed a 1.4 cm calculus in the lower pole of the left kidney.  There was also air in the bladder lumen and lower pole collecting system of the left kidney.  ER physician spoke with Dr. Devere (urology)  Review of Systems: As per HPI otherwise 10 point review of systems negative.   Past Medical History:  Diagnosis Date   Arthritis    all over (08/24/2017)   CHF (congestive heart failure) (HCC)    Chronic airway obstruction (HCC)    Chronic lower back pain    Coronary artery disease    Disorder of liver    spots on my liver; they've rechecked them; they haven't changed (08/24/2017)   Dyspnea    Esophageal reflux    Gout    on daily RX  (08/24/2017)   Hyperlipidemia    Hypertension    Hypokalemia 11/2016   Hypotestosteronism 07/01/2013   Insomnia    Iron  deficiency anemia, unspecified 07/01/2013   Memory loss    On home oxygen  therapy    2L prn (08/24/2017)   OSA on CPAP    PVD (peripheral vascular disease)    Renal disorder    Stroke (HCC) 01/2017   left sided weakness remains (08/24/2017)   Type II diabetes mellitus (HCC)    Vitamin D  deficiency     Past Surgical History:  Procedure Laterality Date   BIOPSY  11/06/2022   Procedure: BIOPSY;  Surgeon: Shaaron Lamar HERO, MD;  [facility];  Service: Endoscopy;;   BIOPSY  04/01/2023   Procedure: BIOPSY;  Surgeon: Wilhelmenia Aloha Raddle., MD;  Location: [facility];  Service: Gastroenterology;;   COLONOSCOPY N/A 08/26/2017   Procedure: COLONOSCOPY;  Surgeon: Avram Lupita BRAVO, MD;  Location: [facility] ENDOSCOPY;  Service: Endoscopy;  Laterality: N/A;   COLONOSCOPY WITH PROPOFOL  N/A 11/06/2022   Procedure: COLONOSCOPY WITH PROPOFOL ;  Surgeon: Shaaron Lamar HERO, MD;  [facility];  Service: Endoscopy;  Laterality: N/A;  1:15 PM   ENTEROSCOPY N/A 05/24/2018   Procedure: ENTEROSCOPY;  Surgeon: San Sandor GAILS, DO;  Location: [facility];  Service: Gastroenterology;  Laterality: N/A;   ESOPHAGOGASTRODUODENOSCOPY N/A 08/26/2017   Procedure: ESOPHAGOGASTRODUODENOSCOPY (EGD);  Surgeon: Avram Lupita BRAVO, MD;  Location: [facility];  Service: Endoscopy;  Laterality: N/A;   ESOPHAGOGASTRODUODENOSCOPY N/A 05/17/2024   Procedure: EGD (ESOPHAGOGASTRODUODENOSCOPY);  Surgeon: Shaaron Lamar HERO, MD;  [facility];  Service: Endoscopy;  Laterality: N/A;  7:30 am, asa 3   ESOPHAGOGASTRODUODENOSCOPY (EGD) WITH PROPOFOL  N/A 11/06/2022   Procedure: ESOPHAGOGASTRODUODENOSCOPY (EGD) WITH PROPOFOL ;  Surgeon: Shaaron Lamar HERO, MD;  [facility];  Service: Endoscopy;  Laterality: N/A;   ESOPHAGOGASTRODUODENOSCOPY (EGD) WITH PROPOFOL  N/A 04/01/2023   Procedure:  ESOPHAGOGASTRODUODENOSCOPY (EGD) WITH PROPOFOL ;  Surgeon: Wilhelmenia Aloha Raddle., MD;  [facility]: [facility];  Service: Gastroenterology;  Laterality: N/A;   EUS N/A 04/01/2023   Procedure: UPPER ENDOSCOPIC ULTRASOUND (EUS) RADIAL;  Surgeon: Wilhelmenia Aloha Raddle., MD;  Location: [facility];  Service: Gastroenterology;  Laterality: N/A;   HEMOSTASIS CLIP PLACEMENT  04/01/2023   Procedure: HEMOSTASIS CLIP PLACEMENT;  Surgeon: Wilhelmenia Aloha Raddle., MD;  Location: [facility];  Service: Gastroenterology;;   HOT HEMOSTASIS N/A 05/24/2018   Procedure: HOT HEMOSTASIS (ARGON PLASMA COAGULATION/BICAP);  Surgeon: San Sandor GAILS, DO;  Location: [facility];  Service: Gastroenterology;  Laterality: N/A;   KNEE ARTHROSCOPY Left    LOOP RECORDER IMPLANT  06/2017   POLYPECTOMY  05/24/2018   Procedure: POLYPECTOMY;  Surgeon: San Sandor GAILS, DO;  Location: [facility];  Service: Gastroenterology;;   POLYPECTOMY  11/06/2022   Procedure: POLYPECTOMY;  Surgeon: Shaaron Lamar HERO, MD;  [facility];  Service: Endoscopy;;   POLYPECTOMY  04/01/2023   Procedure: POLYPECTOMY;  Surgeon: Wilhelmenia Aloha Raddle., MD;  Location: [facility];  Service: Gastroenterology;;   TEE WITHOUT CARDIOVERSION N/A 12/24/2016   Procedure: TRANSESOPHAGEAL ECHOCARDIOGRAM (TEE);  Surgeon: Okey Vina GAILS, MD;  Location: [facility] ENDOSCOPY;  Service: Cardiovascular;  Laterality: N/A;     reports that he has been smoking cigarettes. He started smoking about 41 years ago. He has a 19.2 pack-year smoking history. He has never used smokeless tobacco. He reports that he does not drink alcohol and does not use drugs.  Allergies[1]  Family History  Problem Relation Age of Onset   Heart disease Mother    Diabetes Father    Stroke Brother    Stroke Paternal Uncle    Diabetes Sister    Heart attack Brother    Heart attack Brother     Prior to Admission medications  Medication Sig Start Date End Date Taking? Authorizing  Provider  albuterol  (VENTOLIN  HFA) 108 (90 Base) MCG/ACT inhaler Inhale 2 puffs into the lungs every 6 (six) hours as needed for wheezing or shortness of breath. 09/08/19   Sood, Vineet, MD  allopurinol  (ZYLOPRIM ) 300 MG tablet Take 300 mg by mouth daily.    [provider]  amLODipine (NORVASC) 5 MG tablet Take 1 tablet by mouth daily. 10/28/23   [provider]  aspirin  EC 81 MG tablet Take 81 mg by mouth daily. Swallow whole.    [provider]  atorvastatin  (LIPITOR ) 80 MG tablet Take 1 tablet (80 mg total) by mouth every morning. 12/25/16   Svalina, Gorica, MD  BD INSULIN  SYRINGE ULTRAFINE 31G X 5/16 0.5 ML MISC INJECT 1 EACH UNDER THE SKIN DAILY AS NEEDED    [provider]  BELSOMRA 20 MG TABS Take 1 tablet by mouth at bedtime.    [provider]  Blood Glucose Monitoring Suppl (FIFTY50 GLUCOSE METER 2.0) w/Device KIT To check blood sugars TID or Use as instructed Include strips. Lancets, lancet device, control solution. batteries 04/11/19   [provider]  BREO ELLIPTA  200-25 MCG/ACT AEPB Inhale 1 puff by mouth once daily 07/04/24   Hope Almarie ORN, NP  clopidogrel  (PLAVIX ) 75 MG tablet Take 1 tablet (75 mg total) by mouth daily. 04/03/23   Mansouraty, Gabriel Jr., MD  colchicine 0.6 MG tablet Take 0.6 mg by mouth daily. 03/04/21   [provider]  Continuous Glucose Receiver (FREESTYLE LIBRE 3 READER) DEVI  10/28/23   [provider]  Continuous Glucose Sensor (FREESTYLE LIBRE 3 SENSOR) MISC  08/15/23   [provider]  Cyanocobalamin  (VITAMIN B-12 IJ) Inject 1,000 mg as directed See admin instructions. Every 30 days as needed    [provider]  diclofenac  Sodium (VOLTAREN ) 1 % GEL Apply 2 g topically daily as needed (pain). 04/07/21   [provider]  DULoxetine  (CYMBALTA ) 60 MG capsule Take 60 mg by mouth 2 (two) times daily.     [provider]  fluticasone  (FLONASE ) 50 MCG/ACT nasal  spray Place 1 spray into both nostrils daily. 10/28/21   Sood, Vineet, MD  furosemide  (LASIX ) 20 MG tablet Take 20 mg by mouth every Tuesday, Thursday, and Saturday at 6 PM.  03/21/13   [provider]  gabapentin  (NEURONTIN ) 300 MG capsule Take 600 mg by mouth 3 (three) times daily. 10/18/14   [provider]  glucose blood test strip 1 each by Misc.(Non-Drug; Combo Route) route daily. 05/26/17   [provider]  HUMULIN R  100 UNIT/ML injection Use as sliding scale up to 10 units tid with meals 08/13/23   [provider]  Insulin  Glargine (TOUJEO  SOLOSTAR [place]) Inject 70 Units into the skin daily with supper. 04/28/21   [provider]  insulin  NPH-regular Human (NOVOLIN 70/30) (70-30) 100 UNIT/ML injection Please inject 25 units twice daily before breakfast and dinner. 08/26/17   Amin, Sumayya, MD  Insulin  Pen Needle (FIFTY50 PEN NEEDLES) 32G X 4 MM MISC 1 each by Misc.(Non-Drug; Combo Route) route daily. 04/18/19   [provider]  Ipratropium-Albuterol  (COMBIVENT) 20-100 MCG/ACT AERS respimat (20-100 mcg/actuat)    [provider]  Lancets (ONETOUCH ULTRASOFT) lancets USE 1 LANCET TO CHECK GLUCOSE IN THE MORNING BEFORE BREAKFAST 03/28/18   [provider]  losartan  (COZAAR ) 25 MG tablet Take 25 mg by mouth at bedtime.    [provider]  meclizine  (ANTIVERT ) 12.5 MG tablet Take 1 tablet (12.5 mg total) by mouth 3 (three) times daily as needed for dizziness. 07/23/21   Patt Alm Macho, MD  methocarbamol (ROBAXIN) 500 MG tablet Take 500 mg by mouth 3 (three) times daily as needed for muscle spasms. 04/09/21   [provider]  metoprolol  succinate (TOPROL -XL) 100 MG 24 hr tablet Take 100 mg by mouth daily. 07/01/22   [provider]  nystatin cream (MYCOSTATIN) Apply topically 2 (two) times daily as needed. 06/02/23   [provider]  OZEMPIC, 0.25 OR 0.5 MG/DOSE, 2 MG/3ML SOPN Inject 0.25 mg as directed  once a week. 09/24/23   [provider]  pantoprazole  (PROTONIX ) 20 MG tablet TAKE 1 TABLET BY MOUTH TWICE DAILY BEFORE A MEAL 10/21/23   Mansouraty, Gabriel Jr., MD  potassium chloride  SA (K-DUR,KLOR-CON ) 20 MEQ tablet Take 1 tablet (20 mEq total) by mouth 2 (two) times daily. 05/03/15   Timmy Maude SAUNDERS, MD  primidone  (MYSOLINE ) 50 MG tablet Take 100 mg by mouth 2 (two) times daily. 01/26/17   [provider]  sildenafil (VIAGRA) 100 MG tablet Take 100 mg by mouth at bedtime as needed.  [provider]  spironolactone (ALDACTONE) 25 MG tablet Take 25 mg by mouth every Monday, Wednesday, and Friday. 08/18/19   [provider]  tamsulosin  (FLOMAX ) 0.4 MG CAPS capsule Take 1 capsule (0.4 mg total) by mouth daily after breakfast. 12/06/15   Timmy Maude SAUNDERS, MD  traMADol  (ULTRAM ) 50 MG tablet Take 50 mg by mouth daily as needed for moderate pain.  09/08/13   [provider]  varenicline  (CHANTIX ) 1 MG tablet Take 1 tablet (1 mg total) by mouth 2 (two) times daily. 07/31/24   Geofm Delon BRAVO, NP    Physical Exam: Vitals:   08/17/24 1200 08/17/24 1215 08/17/24 1230 08/17/24 1245  BP: (!) 88/60 (!) 99/59 91/65 93/67   Pulse: 90 100 99 100  Resp: (!) 21 (!) 26 19 20   Temp: 99.5 F (37.5 C) 99.6 F (37.6 C) 99.7 F (37.6 C) 99.8 F (37.7 C)  TempSrc:      SpO2: 99% 94% (!) 87% (!) 66%  Weight:      Height:          Constitutional: Ill-appearing Eyes: PERRL, lids and conjunctivae normal ENMT: Mucous membranes are dry  Neck: normal, supple, no masses, no thyromegaly Respiratory: clear to auscultation bilaterally, no wheezing, no crackles. Normal respiratory effort. No accessory muscle use.  Cardiovascular: Regular rate and rhythm, no murmurs / rubs / gallops. No extremity edema. 2+ pedal pulses.  Abdomen: no tenderness, no masses palpated. Bowel sounds positive.  Musculoskeletal: no clubbing / cyanosis. Normal muscle tone.  Skin: no rashes,  lesions, ulcers. No induration Neurologic: CN 2-12 grossly intact. Strength 5/5 in all 4.  Psychiatric: Normal judgment and insight. Alert and oriented x 3. Normal mood.   Labs on Admission: I have personally reviewed following labs and imaging studies  CBC: Recent Labs  Lab 08/17/24 0830 08/17/24 0844  WBC 15.7*  --   NEUTROABS 13.6*  --   HGB 10.2* 11.9*  HCT 34.6* 35.0*  MCV 81.0  --   PLT 329  --    Basic Metabolic Panel: Recent Labs  Lab 08/17/24 0830 08/17/24 0844  NA 133* 134*  K 4.5 4.5  CL 95* 101  CO2 20*  --   GLUCOSE 166* 168*  BUN 33* 41*  CREATININE 3.71* 4.00*  CALCIUM  10.0  --    GFR: Estimated Creatinine Clearance: 19.8 mL/min (A) (by C-G formula based on SCr of 4 mg/dL (H)). Liver Function Tests: Recent Labs  Lab 08/17/24 0830  AST 21  ALT 9  ALKPHOS 111  BILITOT 0.9  PROT 8.4*  ALBUMIN 3.6   Recent Labs  Lab 08/17/24 0830  LIPASE 21   No results for input(s): AMMONIA in the last 168 hours. Coagulation Profile: Recent Labs  Lab 08/17/24 0843  INR 1.1   Cardiac Enzymes: No results for input(s): CKTOTAL, CKMB, CKMBINDEX, TROPONINI in the last 168 hours. BNP (last 3 results) No results for input(s): PROBNP in the last 8760 hours. HbA1C: No results for input(s): HGBA1C in the last 72 hours. CBG: Recent Labs  Lab 08/17/24 1031  GLUCAP 136*   Lipid Profile: No results for input(s): CHOL, HDL, LDLCALC, TRIG, CHOLHDL, LDLDIRECT in the last 72 hours. Thyroid  Function Tests: No results for input(s): TSH, T4TOTAL, FREET4, T3FREE, THYROIDAB in the last 72 hours. Anemia Panel: No results for input(s): VITAMINB12, FOLATE, FERRITIN, TIBC, IRON , RETICCTPCT in the last 72 hours. Urine analysis:    Component Value Date/Time   COLORURINE YELLOW 08/17/2024 1052   APPEARANCEUR CLOUDY (  A) 08/17/2024 1052   LABSPEC 1.012 08/17/2024 1052   PHURINE 5.0 08/17/2024 1052   GLUCOSEU NEGATIVE  08/17/2024 1052   HGBUR MODERATE (A) 08/17/2024 1052   BILIRUBINUR NEGATIVE 08/17/2024 1052   KETONESUR NEGATIVE 08/17/2024 1052   PROTEINUR 100 (A) 08/17/2024 1052   NITRITE NEGATIVE 08/17/2024 1052   LEUKOCYTESUR LARGE (A) 08/17/2024 1052     Radiological Exams on Admission: US  EKG SITE RITE Result Date: 08/17/2024 If Site Rite image not attached, placement could not be confirmed due to current cardiac rhythm.  CT ABDOMEN PELVIS WO CONTRAST Result Date: 08/17/2024 CLINICAL DATA:  Abdominal pain, acute, nonlocalized Possible sepsis. EXAM: CT ABDOMEN AND PELVIS WITHOUT CONTRAST TECHNIQUE: Multidetector CT imaging of the abdomen and pelvis was performed following the standard protocol without IV contrast. RADIATION DOSE REDUCTION: This exam was performed according to the departmental dose-optimization program which includes automated exposure control, adjustment of the mA and/or kV according to patient size and/or use of iterative reconstruction technique. COMPARISON:  Abdominal CT 12/16/2022. Abdominal MRI 05/31/2024. PET-CT 10/08/2022. FINDINGS: Lower chest: Mild dependent atelectasis or scarring at both lung bases. No suspicious nodularity or significant pleural effusion. Hepatobiliary: The liver has a non cirrhotic morphology without suspicious focal abnormality on noncontrast imaging. Previously demonstrated hepatic cysts are grossly stable. No evidence of gallstones, gallbladder wall thickening or biliary dilatation. Pancreas: Unremarkable. No pancreatic ductal dilatation or surrounding inflammatory changes. Spleen: Normal in size without focal abnormality. 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 The left adrenal gland appears unremarkable. Previously demonstrated 1.4 cm calculus in the lower pole of the left kidney has moved cephalad into the lower pole infundibulum and is associated with mild  lower pole caliectasis and perinephric soft tissue stranding, suspicious for partial obstruction or inflammation/infection. Small dot of air anteriorly in the lower pole of the left kidney, likely in the collecting system. No evidence of perinephric fluid collection, ureteral calculus or ureteral dilatation. The right kidney appears unremarkable. There is air in the bladder lumen which may be iatrogenic. Stomach/Bowel: No enteric contrast administered. The stomach appears unremarkable for its degree of distension. No evidence of bowel wall thickening, distention or surrounding inflammatory change. The appendix appears normal. Mildly prominent stool throughout the colon. Vascular/Lymphatic: There are no enlarged abdominal or pelvic lymph nodes. Aortic and branch vessel atherosclerosis without evidence of aneurysm. Reproductive: The prostate gland and seminal vesicles appear unremarkable. Other: No evidence of abdominal wall mass or hernia. No ascites or pneumoperitoneum. Musculoskeletal: No acute or significant osseous findings. Multilevel lumbar facet arthropathy. IMPRESSION: 1. Previously demonstrated 1.4 cm calculus in the lower pole of the left kidney has moved cephalad into the lower pole infundibulum and is associated with mild lower pole caliectasis and perinephric soft tissue stranding, suspicious for partial obstruction or inflammation/infection. Correlate with urine analysis. No evidence of ureteral calculus, ureteral dilatation or perinephric fluid collection. 2. Air in the bladder lumen and lower pole collecting system of the left kidney may be iatrogenic. Correlate with recent instrumentation/urine analysis. 3. No other acute findings. 4. 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 5.  Aortic Atherosclerosis (ICD10-I70.0). Electronically Signed   By: Elsie Perone M.D.   On: 08/17/2024 10:31   DG Chest Port 1 View Result Date: 08/17/2024 EXAM: 1 VIEW XRAY  OF THE CHEST 08/17/2024 08:58:00 AM COMPARISON: Chest radiographs 07/23/2021 and earlier. CLINICAL HISTORY: 69 year old male. Questionable sepsis; evaluate for abnormality.  FINDINGS: LINES, TUBES AND DEVICES: Loop recorder in left chest, stable. LUNGS AND PLEURA: No focal pulmonary opacity. No pleural effusion. No pneumothorax. HEART AND MEDIASTINUM: No acute abnormality of the cardiac and mediastinal silhouettes. BONES AND SOFT TISSUES: No acute osseous abnormality. VISIBLE ABDOMEN: Paucity of bowel gas in the visible abdomen. IMPRESSION: 1. No acute cardiopulmonary abnormality. Electronically signed by: Helayne Hurst MD 08/17/2024 09:07 AM EST RP Workstation: [id number]    EKG: Independently reviewed.  Sinus tachycardia  Assessment/Plan Principal Problem:   Sepsis (HCC)    Severe Sepsis -Has received 3 L of IV fluids-will need to be monitored closely for volume overload - Blood cultures pending, urine culture pending -IV antibiotics (given rocephin  in ER, will give dose of vanc) - Suspected urinary source - Patient does have a renal stone but ER spoke with urology who states no intervention currently needed (per ER MD: I spoke with urology.  Dr. Carolynn and he stated the patient does not need any urology intervention at this time.  He stated that after the patient recovers from his urosepsis that he can be seen as an outpatient and they can treat his large kidney stone at that point )  AKI -IVF -recheck labs -strict I/Os  Type 2 diabetes - Sliding scale insulin   ?  New onset A-flutter/fib - Will monitor on telemetry - Start blood thinner if confirmed  Hypertension - Holding home medications as patient is hypotensive (on amlodipine/metoprolol /losartan )  Chronic systolic heart failure-EF 45 to 50% - Follows with Dr. Epifanio - On diuretics 3 times per week  OSA -CPAP at bedtime  Emphysema - Follows with Dr. Alva - Breo inhaler - Smoking cessation  Smoldering multiple  myeloma - Skeletal survey in February 2025 with no lytic lesions - Follows with Delon Hope, NP at oncology center  Normocytic anemia - Gets IV iron  infusions per oncology  History of stroke - Patient has a loop recorder to rule out atrial fibrillation  DVT prophylaxis: Heparin  Code Status: Full Family Communication: At bedside Consults called: Urology (phone)   Admission status: Inpatient    Harlene RAYMOND Bowl [facility]  CRITICAL CARE Performed by: Harlene RAYMOND Bowl   Total critical care time: 75 minutes  Critical care time was exclusive of separately billable procedures and treating other patients.  Critical care was necessary to treat or prevent imminent or life-threatening deterioration.  Critical care was time spent personally by me on the following activities: development of treatment plan with patient and/or surrogate as well as nursing, discussions with consultants, evaluation of patient's response to treatment, examination of patient, obtaining history from patient or surrogate, ordering and performing treatments and interventions, ordering and review of laboratory studies, ordering and review of radiographic studies, pulse oximetry and re-evaluation of patient's condition.   How to contact the [facility] Attending or Consulting provider 7A - 7P or covering provider during after hours 7P -7A, for this patient?  Check the care team in [facility] and look for a) attending/consulting [facility] provider listed and b) the [facility] team listed Log into [email or web address] and use [facility] universal password to access. If you do not have the password, please contact the hospital operator. Locate the [facility] provider you are looking for under [facility] and page to a number that you can be directly reached. If you still have difficulty reaching the provider, please page the [facility] (Director on Call) for the Hospitalists listed on amion for assistance.  08/17/2024, 1:21 PM          [  1] No  Known Allergies  "

## 2024-08-17 NOTE — Plan of Care (Signed)
" °  Problem: Education: Goal: Ability to describe self-care measures that may prevent or decrease complications (Diabetes Survival Skills Education) will improve Outcome: Progressing Goal: Individualized Educational Video(s) Outcome: Progressing   Problem: Coping: Goal: Ability to adjust to condition or change in health will improve Outcome: Progressing   Problem: Fluid Volume: Goal: Ability to maintain a balanced intake and output will improve Outcome: Progressing   Problem: Health Behavior/Discharge Planning: Goal: Ability to identify and utilize available resources and services will improve Outcome: Progressing Goal: Ability to manage health-related needs will improve Outcome: Progressing   Problem: Metabolic: Goal: Ability to maintain appropriate glucose levels will improve Outcome: Progressing   Problem: Nutritional: Goal: Maintenance of adequate nutrition will improve Outcome: Progressing Goal: Progress toward achieving an optimal weight will improve Outcome: Progressing   Problem: Skin Integrity: Goal: Risk for impaired skin integrity will decrease Outcome: Progressing   Problem: Tissue Perfusion: Goal: Adequacy of tissue perfusion will improve Outcome: Progressing   Problem: Education: Goal: Knowledge of General Education information will improve Description: Including pain rating scale, medication(s)/side effects and non-pharmacologic comfort measures Outcome: Progressing   Problem: Health Behavior/Discharge Planning: Goal: Ability to manage health-related needs will improve Outcome: Progressing   Problem: Clinical Measurements: Goal: Ability to maintain clinical measurements within normal limits will improve Outcome: Progressing Goal: Will remain free from infection Outcome: Progressing Goal: Diagnostic test results will improve Outcome: Progressing Goal: Respiratory complications will improve Outcome: Progressing Goal: Cardiovascular complication will  be avoided Outcome: Not Progressing   Problem: Activity: Goal: Risk for activity intolerance will decrease Outcome: Not Progressing   Problem: Nutrition: Goal: Adequate nutrition will be maintained Outcome: Progressing   Problem: Coping: Goal: Level of anxiety will decrease Outcome: Progressing   Problem: Pain Managment: Goal: General experience of comfort will improve and/or be controlled Outcome: Progressing   Problem: Elimination: Goal: Will not experience complications related to bowel motility Outcome: Progressing Goal: Will not experience complications related to urinary retention Outcome: Progressing   Problem: Safety: Goal: Ability to remain free from injury will improve Outcome: Progressing   Problem: Skin Integrity: Goal: Risk for impaired skin integrity will decrease Outcome: Progressing   "

## 2024-08-17 NOTE — ED Provider Notes (Signed)
 " [place] EMERGENCY DEPARTMENT AT [facility] Provider Note   CSN: [id number] Arrival date & time: 08/17/24  9250     Patient presents with: Weakness   James Zuniga is a 69 y.o. male.  {Add pertinent medical, surgical, social history, OB history to YEP:[id number]} Patient has a history of congestive heart failure and diabetes and coronary artery disease.  He complains of left upper quadrant and left lower quadrant pain with vomiting for 2 days now.  He also complains of severe weakness  The history is provided by the patient and medical records. No language interpreter was used.  Weakness Severity:  Severe Onset quality:  Sudden Timing:  Constant Progression:  Worsening Chronicity:  New Context: not alcohol use   Relieved by:  Nothing Worsened by:  Nothing Ineffective treatments:  None tried Associated symptoms: abdominal pain and vomiting   Associated symptoms: no chest pain, no cough, no diarrhea, no frequency, no headaches and no seizures        Prior to Admission medications  Medication Sig Start Date End Date Taking? Authorizing Provider  albuterol  (VENTOLIN  HFA) 108 (90 Base) MCG/ACT inhaler Inhale 2 puffs into the lungs every 6 (six) hours as needed for wheezing or shortness of breath. 09/08/19   Sood, Vineet, MD  allopurinol  (ZYLOPRIM ) 300 MG tablet Take 300 mg by mouth daily.    [provider]  amLODipine (NORVASC) 5 MG tablet Take 1 tablet by mouth daily. 10/28/23   [provider]  aspirin  EC 81 MG tablet Take 81 mg by mouth daily. Swallow whole.    [provider]  atorvastatin  (LIPITOR ) 80 MG tablet Take 1 tablet (80 mg total) by mouth every morning. 12/25/16   Svalina, Gorica, MD  BD INSULIN  SYRINGE ULTRAFINE 31G X 5/16 0.5 ML MISC INJECT 1 EACH UNDER THE SKIN DAILY AS NEEDED    [provider]  BELSOMRA 20 MG TABS Take 1 tablet by mouth at bedtime.    [provider]  Blood Glucose Monitoring Suppl (FIFTY50  GLUCOSE METER 2.0) w/Device KIT To check blood sugars TID or Use as instructed Include strips. Lancets, lancet device, control solution. batteries 04/11/19   [provider]  BREO ELLIPTA  200-25 MCG/ACT AEPB Inhale 1 puff by mouth once daily 07/04/24   Hope Almarie ORN, NP  clopidogrel  (PLAVIX ) 75 MG tablet Take 1 tablet (75 mg total) by mouth daily. 04/03/23   Mansouraty, Gabriel Jr., MD  colchicine 0.6 MG tablet Take 0.6 mg by mouth daily. 03/04/21   [provider]  Continuous Glucose Receiver (FREESTYLE LIBRE 3 READER) DEVI  10/28/23   [provider]  Continuous Glucose Sensor (FREESTYLE LIBRE 3 SENSOR) MISC  08/15/23   [provider]  Cyanocobalamin  (VITAMIN B-12 IJ) Inject 1,000 mg as directed See admin instructions. Every 30 days as needed    [provider]  diclofenac  Sodium (VOLTAREN ) 1 % GEL Apply 2 g topically daily as needed (pain). 04/07/21   [provider]  DULoxetine  (CYMBALTA ) 60 MG capsule Take 60 mg by mouth 2 (two) times daily.     [provider]  fluticasone  (FLONASE ) 50 MCG/ACT nasal spray Place 1 spray into both nostrils daily. 10/28/21   Sood, Vineet, MD  furosemide  (LASIX ) 20 MG tablet Take 20 mg by mouth every Tuesday, Thursday, and Saturday at 6 PM.  03/21/13   [provider]  gabapentin  (NEURONTIN ) 300 MG capsule Take 600 mg by mouth 3 (three) times daily. 10/18/14  [provider]  glucose blood test strip 1 each by Misc.(Non-Drug; Combo Route) route daily. 05/26/17   [provider]  HUMULIN R  100 UNIT/ML injection Use as sliding scale up to 10 units tid with meals 08/13/23   [provider]  Insulin  Glargine (TOUJEO  SOLOSTAR [place]) Inject 70 Units into the skin daily with supper. 04/28/21   [provider]  insulin  NPH-regular Human (NOVOLIN 70/30) (70-30) 100 UNIT/ML injection Please inject 25 units twice daily before breakfast and dinner. 08/26/17   Caleen Qualia, MD   Insulin  Pen Needle (FIFTY50 PEN NEEDLES) 32G X 4 MM MISC 1 each by Misc.(Non-Drug; Combo Route) route daily. 04/18/19   [provider]  Ipratropium-Albuterol  (COMBIVENT) 20-100 MCG/ACT AERS respimat (20-100 mcg/actuat)    [provider]  Lancets (ONETOUCH ULTRASOFT) lancets USE 1 LANCET TO CHECK GLUCOSE IN THE MORNING BEFORE BREAKFAST 03/28/18   [provider]  losartan  (COZAAR ) 25 MG tablet Take 25 mg by mouth at bedtime.    [provider]  meclizine  (ANTIVERT ) 12.5 MG tablet Take 1 tablet (12.5 mg total) by mouth 3 (three) times daily as needed for dizziness. 07/23/21   Patt Alm Macho, MD  methocarbamol (ROBAXIN) 500 MG tablet Take 500 mg by mouth 3 (three) times daily as needed for muscle spasms. 04/09/21   [provider]  metoprolol  succinate (TOPROL -XL) 100 MG 24 hr tablet Take 100 mg by mouth daily. 07/01/22   [provider]  nystatin cream (MYCOSTATIN) Apply topically 2 (two) times daily as needed. 06/02/23   [provider]  OZEMPIC, 0.25 OR 0.5 MG/DOSE, 2 MG/3ML SOPN Inject 0.25 mg as directed once a week. 09/24/23   [provider]  pantoprazole  (PROTONIX ) 20 MG tablet TAKE 1 TABLET BY MOUTH TWICE DAILY BEFORE A MEAL 10/21/23   Mansouraty, Gabriel Jr., MD  potassium chloride  SA (K-DUR,KLOR-CON ) 20 MEQ tablet Take 1 tablet (20 mEq total) by mouth 2 (two) times daily. 05/03/15   Timmy Maude SAUNDERS, MD  primidone  (MYSOLINE ) 50 MG tablet Take 100 mg by mouth 2 (two) times daily. 01/26/17   [provider]  sildenafil (VIAGRA) 100 MG tablet Take 100 mg by mouth at bedtime as needed.    [provider]  spironolactone (ALDACTONE) 25 MG tablet Take 25 mg by mouth every Monday, Wednesday, and Friday. 08/18/19   [provider]  tamsulosin  (FLOMAX ) 0.4 MG CAPS capsule Take 1 capsule (0.4 mg total) by mouth daily after breakfast. 12/06/15   Timmy Maude SAUNDERS, MD  traMADol  (ULTRAM ) 50 MG tablet Take 50 mg  by mouth daily as needed for moderate pain.  09/08/13   [provider]  varenicline  (CHANTIX ) 1 MG tablet Take 1 tablet (1 mg total) by mouth 2 (two) times daily. 07/31/24   Geofm Delon BRAVO, NP    Allergies: Patient has no known allergies.    Review of Systems  Constitutional:  Negative for appetite change and fatigue.  HENT:  Negative for congestion, ear discharge and sinus pressure.   Eyes:  Negative for discharge.  Respiratory:  Negative for cough.   Cardiovascular:  Negative for chest pain.  Gastrointestinal:  Positive for abdominal pain and vomiting. Negative for diarrhea.  Genitourinary:  Negative for frequency and hematuria.  Musculoskeletal:  Negative for back pain.  Skin:  Negative for rash.  Neurological:  Positive for weakness. Negative for seizures and headaches.  Psychiatric/Behavioral:  Negative for hallucinations.     Updated Vital Signs BP (!) 96/59   Pulse ROLLEN)  113   Temp 99.5 F (37.5 C)   Resp 14   Ht 5' 6 (1.676 m)   Wt 105 kg   SpO2 (!) 87%   BMI 37.36 kg/m   Physical Exam Vitals and nursing note reviewed.  Constitutional:      Appearance: He is well-developed.  HENT:     Head: Normocephalic.     Comments: Dry mucous membrane    Nose: Nose normal.     Mouth/Throat:     Mouth: Mucous membranes are dry.  Eyes:     General: No scleral icterus.    Conjunctiva/sclera: Conjunctivae normal.  Neck:     Thyroid : No thyromegaly.  Cardiovascular:     Rate and Rhythm: Normal rate and regular rhythm.     Heart sounds: No murmur heard.    No friction rub. No gallop.  Pulmonary:     Breath sounds: No stridor. No wheezing or rales.  Chest:     Chest wall: No tenderness.  Abdominal:     General: There is no distension.     Tenderness: There is abdominal tenderness. There is no rebound.     Comments: Minimal tenderness  Musculoskeletal:        General: Normal range of motion.     Cervical back: Neck supple.  Lymphadenopathy:     Cervical: No  cervical adenopathy.  Skin:    Findings: No erythema or rash.  Neurological:     Mental Status: He is alert and oriented to person, place, and time.     Motor: No abnormal muscle tone.     Coordination: Coordination normal.  Psychiatric:        Behavior: Behavior normal.     (all labs ordered are listed, but only abnormal results are displayed) Labs Reviewed  CBC WITH DIFFERENTIAL/PLATELET - Abnormal; Notable for the following components:      Result Value   WBC 15.7 (*)    Hemoglobin 10.2 (*)    HCT 34.6 (*)    MCH 23.9 (*)    MCHC 29.5 (*)    RDW 19.4 (*)    Neutro Abs 13.6 (*)    Abs Immature Granulocytes 0.11 (*)    All other components within normal limits  COMPREHENSIVE METABOLIC PANEL WITH GFR - Abnormal; Notable for the following components:   Sodium 133 (*)    Chloride 95 (*)    CO2 20 (*)    Glucose, Bld 166 (*)    BUN 33 (*)    Creatinine, Ser 3.71 (*)    Total Protein 8.4 (*)    GFR, Estimated 17 (*)    Anion gap 18 (*)    All other components within normal limits  LACTIC ACID, PLASMA - Abnormal; Notable for the following components:   Lactic Acid, Venous 2.8 (*)    All other components within normal limits  PROTIME-INR - Abnormal; Notable for the following components:   Prothrombin Time 15.3 (*)    All other components within normal limits  URINALYSIS, W/ REFLEX TO CULTURE (INFECTION SUSPECTED) - Abnormal; Notable for the following components:   APPearance CLOUDY (*)    Hgb urine dipstick MODERATE (*)    Protein, ur 100 (*)    Leukocytes,Ua LARGE (*)    Bacteria, UA MANY (*)    All other components within normal limits  I-STAT CHEM 8, ED - Abnormal; Notable for the following components:   Sodium 134 (*)    BUN 41 (*)    Creatinine, Ser  4.00 (*)    Glucose, Bld 168 (*)    Calcium , Ion 1.13 (*)    Hemoglobin 11.9 (*)    HCT 35.0 (*)    All other components within normal limits  CBG MONITORING, ED - Abnormal; Notable for the following components:    Glucose-Capillary 136 (*)    All other components within normal limits  TROPONIN T, HIGH SENSITIVITY - Abnormal; Notable for the following components:   Troponin T High Sensitivity 49 (*)    All other components within normal limits  RESP PANEL BY RT-PCR (RSV, FLU A&B, COVID)  RVPGX2  CULTURE, BLOOD (ROUTINE X 2)  CULTURE, BLOOD (ROUTINE X 2)  URINE CULTURE  LIPASE, BLOOD  POC OCCULT BLOOD, ED  TYPE AND SCREEN    EKG: None  Radiology: US  EKG SITE RITE Result Date: 08/17/2024 If Site Rite image not attached, placement could not be confirmed due to current cardiac rhythm.  CT ABDOMEN PELVIS WO CONTRAST Result Date: 08/17/2024 CLINICAL DATA:  Abdominal pain, acute, nonlocalized Possible sepsis. EXAM: CT ABDOMEN AND PELVIS WITHOUT CONTRAST TECHNIQUE: Multidetector CT imaging of the abdomen and pelvis was performed following the standard protocol without IV contrast. RADIATION DOSE REDUCTION: This exam was performed according to the departmental dose-optimization program which includes automated exposure control, adjustment of the mA and/or kV according to patient size and/or use of iterative reconstruction technique. COMPARISON:  Abdominal CT 12/16/2022. Abdominal MRI 05/31/2024. PET-CT 10/08/2022. FINDINGS: Lower chest: Mild dependent atelectasis or scarring at both lung bases. No suspicious nodularity or significant pleural effusion. Hepatobiliary: The liver has a non cirrhotic morphology without suspicious focal abnormality on noncontrast imaging. Previously demonstrated hepatic cysts are grossly stable. No evidence of gallstones, gallbladder wall thickening or biliary dilatation. Pancreas: Unremarkable. No pancreatic ductal dilatation or surrounding inflammatory changes. Spleen: Normal in size without focal abnormality. 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 The left adrenal gland appears  unremarkable. Previously demonstrated 1.4 cm calculus in the lower pole of the left kidney has moved cephalad into the lower pole infundibulum and is associated with mild lower pole caliectasis and perinephric soft tissue stranding, suspicious for partial obstruction or inflammation/infection. Small dot of air anteriorly in the lower pole of the left kidney, likely in the collecting system. No evidence of perinephric fluid collection, ureteral calculus or ureteral dilatation. The right kidney appears unremarkable. There is air in the bladder lumen which may be iatrogenic. Stomach/Bowel: No enteric contrast administered. The stomach appears unremarkable for its degree of distension. No evidence of bowel wall thickening, distention or surrounding inflammatory change. The appendix appears normal. Mildly prominent stool throughout the colon. Vascular/Lymphatic: There are no enlarged abdominal or pelvic lymph nodes. Aortic and branch vessel atherosclerosis without evidence of aneurysm. Reproductive: The prostate gland and seminal vesicles appear unremarkable. Other: No evidence of abdominal wall mass or hernia. No ascites or pneumoperitoneum. Musculoskeletal: No acute or significant osseous findings. Multilevel lumbar facet arthropathy. IMPRESSION: 1. Previously demonstrated 1.4 cm calculus in the lower pole of the left kidney has moved cephalad into the lower pole infundibulum and is associated with mild lower pole caliectasis and perinephric soft tissue stranding, suspicious for partial obstruction or inflammation/infection. Correlate with urine analysis. No evidence of ureteral calculus, ureteral dilatation or perinephric fluid collection. 2. Air in the bladder lumen and lower pole collecting system of the left kidney may be iatrogenic. Correlate with recent instrumentation/urine analysis. 3. No other acute findings.  4. 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 5.  Aortic  Atherosclerosis (ICD10-I70.0). Electronically Signed   By: Elsie Perone M.D.   On: 08/17/2024 10:31   DG Chest Port 1 View Result Date: 08/17/2024 EXAM: 1 VIEW XRAY OF THE CHEST 08/17/2024 08:58:00 AM COMPARISON: Chest radiographs 07/23/2021 and earlier. CLINICAL HISTORY: 69 year old male. Questionable sepsis; evaluate for abnormality. FINDINGS: LINES, TUBES AND DEVICES: Loop recorder in left chest, stable. LUNGS AND PLEURA: No focal pulmonary opacity. No pleural effusion. No pneumothorax. HEART AND MEDIASTINUM: No acute abnormality of the cardiac and mediastinal silhouettes. BONES AND SOFT TISSUES: No acute osseous abnormality. VISIBLE ABDOMEN: Paucity of bowel gas in the visible abdomen. IMPRESSION: 1. No acute cardiopulmonary abnormality. Electronically signed by: Helayne Hurst MD 08/17/2024 09:07 AM EST RP Workstation: [id number]    {Document cardiac monitor, telemetry assessment procedure when appropriate:32947} Procedures   Medications Ordered in the ED  lactated ringers  infusion ( Intravenous New Bag/Given 08/17/24 1157)  sodium chloride  0.9 % bolus 1,000 mL (0 mLs Intravenous Stopped 08/17/24 1140)  ondansetron  (ZOFRAN ) injection 4 mg (4 mg Intravenous Given 08/17/24 0908)  pantoprazole  (PROTONIX ) injection 40 mg (40 mg Intravenous Given 08/17/24 0908)  cefTRIAXone  (ROCEPHIN ) 2 g in sodium chloride  0.9 % 100 mL IVPB (0 g Intravenous Stopped 08/17/24 0930)  sodium chloride  0.9 % bolus 1,000 mL (0 mLs Intravenous Stopped 08/17/24 1140)  sodium chloride  0.9 % bolus 1,000 mL (0 mLs Intravenous Stopped 08/17/24 1142)   CRITICAL CARE Performed by: Fairy Sermon Total critical care time: 45 minutes Critical care time was exclusive of separately billable procedures and treating other patients. Critical care was necessary to treat or prevent imminent or life-threatening deterioration. Critical care was time spent personally by me on the following activities: development of treatment plan with patient and/or  surrogate as well as nursing, discussions with consultants, evaluation of patient's response to treatment, examination of patient, obtaining history from patient or surrogate, ordering and performing treatments and interventions, ordering and review of laboratory studies, ordering and review of radiographic studies, pulse oximetry and re-evaluation of patient's condition.   Patient with urosepsis.  Along with severe AKI.  He responded to volume resuscitation.  Patient has a large kidney stone in his left kidney.  I spoke with urology Dr. Carolynn and he stated the patient does not need any urology intervention at this time.  He stated that after the patient recovers from his urosepsis that he can be seen as an outpatient and they can treat his large kidney stone at that point.   {Click here for ABCD2, HEART and other calculators REFRESH Note before signing:1}                              Medical Decision Making Amount and/or Complexity of Data Reviewed Labs: ordered. Radiology: ordered. ECG/medicine tests: ordered.  Risk Prescription drug management. Decision regarding hospitalization.   Urosepsis.  Patient will be admitted to medicine  {Document critical care time when appropriate  Document review of labs and clinical decision tools ie CHADS2VASC2, etc  Document your independent review of radiology images and any outside records  Document your discussion with family members, caretakers and with consultants  Document social determinants of health affecting pt's care  Document your decision making why or why not admission, treatments were needed:32947:::1}   Final diagnoses:  Acute sepsis [facility])    ED Discharge Orders     None        "

## 2024-08-17 NOTE — Progress Notes (Signed)
 Peripherally Inserted Central Catheter Placement  The IV Nurse has discussed with the patient and/or persons authorized to consent for the patient, the purpose of this procedure and the potential benefits and risks involved with this procedure.  The benefits include less needle sticks, lab draws from the catheter, and the patient may be discharged home with the catheter. Risks include, but not limited to, infection, bleeding, blood clot (thrombus formation), and puncture of an artery; nerve damage and irregular heartbeat and possibility to perform a PICC exchange if needed/ordered by physician.  Alternatives to this procedure were also discussed.  Bard Power PICC patient education guide, fact sheet on infection prevention and patient information card has been provided to patient /or left at bedside.    PICC Placement Documentation  PICC Double Lumen 08/17/24 Right Brachial 42 cm 0 cm (Active)  Indication for Insertion or Continuance of Line Limited venous access - need for IV therapy >5 days 08/17/24 1610  Exposed Catheter (cm) 0 cm 08/17/24 1610  Site Assessment Clean, Dry, Intact 08/17/24 1610  Lumen #1 Status Flushed;Blood return noted;Saline locked 08/17/24 1610  Lumen #2 Status Flushed;Blood return noted;Saline locked 08/17/24 1610  Dressing Type Transparent 08/17/24 1610  Dressing Status Antimicrobial disc/dressing in place 08/17/24 1610  Line Care Connections checked and tightened 08/17/24 1610  Dressing Intervention New dressing 08/17/24 1610  Dressing Change Due 08/24/24 08/17/24 1610       James Zuniga 08/17/2024, 4:12 PM

## 2024-08-18 ENCOUNTER — Inpatient Hospital Stay (HOSPITAL_COMMUNITY)

## 2024-08-18 ENCOUNTER — Encounter: Payer: Self-pay | Admitting: Oncology

## 2024-08-18 ENCOUNTER — Other Ambulatory Visit (HOSPITAL_COMMUNITY): Payer: Self-pay | Admitting: *Deleted

## 2024-08-18 ENCOUNTER — Other Ambulatory Visit (HOSPITAL_COMMUNITY): Payer: Self-pay

## 2024-08-18 ENCOUNTER — Telehealth (HOSPITAL_COMMUNITY): Payer: Self-pay

## 2024-08-18 LAB — COMPREHENSIVE METABOLIC PANEL WITH GFR
ALT: 9 U/L (ref 0–44)
AST: 16 U/L (ref 15–41)
Albumin: 2.9 g/dL — ABNORMAL LOW (ref 3.5–5.0)
Alkaline Phosphatase: 116 U/L (ref 38–126)
Anion gap: 15 (ref 5–15)
BUN: 22 mg/dL (ref 8–23)
CO2: 22 mmol/L (ref 22–32)
Calcium: 8.9 mg/dL (ref 8.9–10.3)
Chloride: 103 mmol/L (ref 98–111)
Creatinine, Ser: 2.22 mg/dL — ABNORMAL HIGH (ref 0.61–1.24)
GFR, Estimated: 31 mL/min — ABNORMAL LOW
Glucose, Bld: 265 mg/dL — ABNORMAL HIGH (ref 70–99)
Potassium: 3.6 mmol/L (ref 3.5–5.1)
Sodium: 140 mmol/L (ref 135–145)
Total Bilirubin: 0.4 mg/dL (ref 0.0–1.2)
Total Protein: 6.6 g/dL (ref 6.5–8.1)

## 2024-08-18 LAB — TSH: TSH: 0.372 u[IU]/mL (ref 0.350–4.500)

## 2024-08-18 LAB — GLUCOSE, CAPILLARY
Glucose-Capillary: 247 mg/dL — ABNORMAL HIGH (ref 70–99)
Glucose-Capillary: 243 mg/dL — ABNORMAL HIGH (ref 70–99)
Glucose-Capillary: 119 mg/dL — ABNORMAL HIGH (ref 70–99)
Glucose-Capillary: 136 mg/dL — ABNORMAL HIGH (ref 70–99)

## 2024-08-18 LAB — ECHOCARDIOGRAM COMPLETE
Area-P 1/2: 2.95 cm2
Calc EF: 39.5 %
Height: 66 in
S' Lateral: 4.4 cm
Single Plane A2C EF: 43.2 %
Single Plane A4C EF: 40.3 %
Weight: 3643.76 [oz_av]

## 2024-08-18 LAB — CULTURE, BLOOD (ROUTINE X 2)
Culture: NO GROWTH
Culture: NO GROWTH
Special Requests: ADEQUATE

## 2024-08-18 LAB — URINE CULTURE: Culture: NO GROWTH

## 2024-08-18 LAB — CBC
HCT: 27.4 % — ABNORMAL LOW (ref 39.0–52.0)
Hemoglobin: 8.1 g/dL — ABNORMAL LOW (ref 13.0–17.0)
MCH: 23.9 pg — ABNORMAL LOW (ref 26.0–34.0)
MCHC: 29.6 g/dL — ABNORMAL LOW (ref 30.0–36.0)
MCV: 80.8 fL (ref 80.0–100.0)
Platelets: 283 10*3/uL (ref 150–400)
RBC: 3.39 MIL/uL — ABNORMAL LOW (ref 4.22–5.81)
RDW: 19.3 % — ABNORMAL HIGH (ref 11.5–15.5)
WBC: 15 10*3/uL — ABNORMAL HIGH (ref 4.0–10.5)
nRBC: 0 % (ref 0.0–0.2)

## 2024-08-18 LAB — HEPARIN LEVEL (UNFRACTIONATED): Heparin Unfractionated: 0.31 [IU]/mL (ref 0.30–0.70)

## 2024-08-18 LAB — HEMOGLOBIN A1C
Hgb A1c MFr Bld: 7.8 % — ABNORMAL HIGH (ref 4.8–5.6)
Mean Plasma Glucose: 177.16 mg/dL

## 2024-08-18 MED ORDER — ACETAMINOPHEN 650 MG RE SUPP: 650.0000 mg | Freq: Four times a day (QID) | RECTAL | Status: AC | PRN

## 2024-08-18 MED ORDER — HEPARIN BOLUS VIA INFUSION
2000.0000 [IU] | Freq: Once | INTRAVENOUS | Status: AC
  Administered 2024-08-18: 2000 [IU] via INTRAVENOUS
  Filled 2024-08-18: qty 2000

## 2024-08-18 MED ORDER — LACTATED RINGERS IV BOLUS
500.0000 mL | Freq: Once | INTRAVENOUS | Status: AC
  Administered 2024-08-18: 500 mL via INTRAVENOUS

## 2024-08-18 MED ORDER — ACETAMINOPHEN 500 MG PO TABS
1000.0000 mg | ORAL_TABLET | Freq: Four times a day (QID) | ORAL | Status: AC | PRN
  Administered 2024-08-18 (×2): 1000 mg via ORAL
  Filled 2024-08-18 (×2): qty 2

## 2024-08-18 MED ORDER — LACTATED RINGERS IV SOLN: INTRAVENOUS | Status: AC

## 2024-08-18 MED ORDER — MAGNESIUM SULFATE 2 GM/50ML IV SOLN
2.0000 g | Freq: Once | INTRAVENOUS | Status: AC
  Administered 2024-08-18: 2 g via INTRAVENOUS
  Filled 2024-08-18: qty 50

## 2024-08-18 NOTE — Progress Notes (Signed)
*  PRELIMINARY RESULTS* Echocardiogram 2D Echocardiogram has been performed.  James Zuniga 08/18/2024, 1:06 PM

## 2024-08-18 NOTE — Plan of Care (Signed)

## 2024-08-18 NOTE — Significant Event (Signed)
"  ° °      CROSS COVER NOTE  NAME: James Zuniga MRN: [id number] DOB : 22-Mar-1956 ATTENDING PHYSICIAN: James Harlene PENNER, DO    Date of Service   08/18/2024   HPI/Events of Note   [facility] Cross Cover at [facility] Continued management for sepsis, Afib RVR hypotension  Interventions   Assessment/Plan: Overnight   Diflucan  added to med regimen Initial improvement  of HR and Bp with amio  given on days lost with worsening fever. - dose of 35 mg IV dilt given initially with HR RVR up to 167/ amio left at 60 mg/h Cooling blanket Additional 500 ml  LR fluid bolus 2 gm IV mag Acetaminophen  dose increased to 1 gm prn every 6 for fever  At 0600 patient in SR in 80s, afebrile       James LITTIE Cone NP [facility] Cross Cover 7pm-7am - check amion for availability Pager [phone]  "

## 2024-08-18 NOTE — Progress Notes (Addendum)
 PHARMACY - ANTICOAGULATION CONSULT NOTE  Pharmacy Consult for Heparin  Indication: atrial fibrillation  Allergies[1]  Patient Measurements: Height: 5' 6 (167.6 cm) Weight: 103.3 kg (227 lb 11.8 oz) IBW/kg (Calculated) : 63.8 HEPARIN  DW (KG): 86.8  Vital Signs: Temp: 97.3 F (36.3 C) (02/06 0745) Temp Source: Bladder (02/06 0745) BP: 106/66 (02/06 0745) Pulse Rate: 81 (02/06 0745)  Labs: Recent Labs    08/17/24 0830 08/17/24 0843 08/17/24 0844 08/17/24 1723 08/17/24 2340  HGB 10.2*  --  11.9*  --   --   HCT 34.6*  --  35.0*  --   --   PLT 329  --   --   --   --   LABPROT  --  15.3*  --   --   --   INR  --  1.1  --   --   --   HEPARINUNFRC  --   --   --   --  0.18*  CREATININE 3.71*  --  4.00* 2.92*  --     Estimated Creatinine Clearance: 26.9 mL/min (A) (by C-G formula based on SCr of 2.92 mg/dL (H)).   Medical History: Past Medical History:  Diagnosis Date   Arthritis    all over (08/24/2017)   CHF (congestive heart failure) (HCC)    Chronic airway obstruction (HCC)    Chronic lower back pain    Coronary artery disease    Disorder of liver    spots on my liver; they've rechecked them; they haven't changed (08/24/2017)   Dyspnea    Esophageal reflux    Gout    on daily RX (08/24/2017)   Hyperlipidemia    Hypertension    Hypokalemia 11/2016   Hypotestosteronism 07/01/2013   Insomnia    Iron  deficiency anemia, unspecified 07/01/2013   Memory loss    Multiple myeloma (HCC)    On home oxygen  therapy    2L prn (08/24/2017)   OSA on CPAP    PVD (peripheral vascular disease)    Renal disorder    Stroke (HCC) 01/2017   left sided weakness remains (08/24/2017)   Type II diabetes mellitus (HCC)    Vitamin D  deficiency    Assessment: Patient presented with severe sepsis. Antibiotics initiated and now patient has new onset afib. Not on any oral anticoagulants. Pharmacy asked to start heparin .  Heparin  level now at goal. Hgb down from admit from  11.9>8.1. No bleeding or IV issues noted.    Goal of Therapy:  Heparin  level 0.3-0.7 units/ml Monitor platelets by anticoagulation protocol: Yes   Plan:  Increase heparin  to 1400 units/hr to keep within range Check heparin  level in am  Dempsey Blush PharmD., BCPS Clinical Pharmacist 08/18/2024 8:02 AM     [1] No Known Allergies

## 2024-08-18 NOTE — Progress Notes (Signed)
 Levophed  gtt titrated off at 0845.

## 2024-08-18 NOTE — Progress Notes (Signed)
 PHARMACY - ANTICOAGULATION CONSULT NOTE  Pharmacy Consult for Heparin  Indication: atrial fibrillation  Allergies[1]  Patient Measurements: Height: 5' 6 (167.6 cm) Weight: 103.3 kg (227 lb 11.8 oz) IBW/kg (Calculated) : 63.8 HEPARIN  DW (KG): 86.8  Vital Signs: Temp: 103.6 F (39.8 C) (02/06 0000) BP: 167/107 (02/06 0000) Pulse Rate: 146 (02/06 0000)  Labs: Recent Labs    08/17/24 0830 08/17/24 0843 08/17/24 0844 08/17/24 1723 08/17/24 2340  HGB 10.2*  --  11.9*  --   --   HCT 34.6*  --  35.0*  --   --   PLT 329  --   --   --   --   LABPROT  --  15.3*  --   --   --   INR  --  1.1  --   --   --   HEPARINUNFRC  --   --   --   --  0.18*  CREATININE 3.71*  --  4.00* 2.92*  --     Estimated Creatinine Clearance: 26.9 mL/min (A) (by C-G formula based on SCr of 2.92 mg/dL (H)).   Medical History: Past Medical History:  Diagnosis Date   Arthritis    all over (08/24/2017)   CHF (congestive heart failure) (HCC)    Chronic airway obstruction (HCC)    Chronic lower back pain    Coronary artery disease    Disorder of liver    spots on my liver; they've rechecked them; they haven't changed (08/24/2017)   Dyspnea    Esophageal reflux    Gout    on daily RX (08/24/2017)   Hyperlipidemia    Hypertension    Hypokalemia 11/2016   Hypotestosteronism 07/01/2013   Insomnia    Iron  deficiency anemia, unspecified 07/01/2013   Memory loss    Multiple myeloma (HCC)    On home oxygen  therapy    2L prn (08/24/2017)   OSA on CPAP    PVD (peripheral vascular disease)    Renal disorder    Stroke (HCC) 01/2017   left sided weakness remains (08/24/2017)   Type II diabetes mellitus (HCC)    Vitamin D  deficiency     Medications:  Medications Prior to Admission  Medication Sig Dispense Refill Last Dose/Taking   albuterol  (VENTOLIN  HFA) 108 (90 Base) MCG/ACT inhaler Inhale 2 puffs into the lungs every 6 (six) hours as needed for wheezing or shortness of breath. 18 g 3     allopurinol  (ZYLOPRIM ) 300 MG tablet Take 300 mg by mouth daily.      amLODipine (NORVASC) 5 MG tablet Take 1 tablet by mouth daily.      aspirin  EC 81 MG tablet Take 81 mg by mouth daily. Swallow whole.      atorvastatin  (LIPITOR ) 80 MG tablet Take 1 tablet (80 mg total) by mouth every morning. 30 tablet 0    BD INSULIN  SYRINGE ULTRAFINE 31G X 5/16 0.5 ML MISC INJECT 1 EACH UNDER THE SKIN DAILY AS NEEDED      BELSOMRA 20 MG TABS Take 1 tablet by mouth at bedtime.      Blood Glucose Monitoring Suppl (FIFTY50 GLUCOSE METER 2.0) w/Device KIT To check blood sugars TID or Use as instructed Include strips. Lancets, lancet device, control solution. batteries      BREO ELLIPTA  200-25 MCG/ACT AEPB Inhale 1 puff by mouth once daily 60 each 2    clopidogrel  (PLAVIX ) 75 MG tablet Take 1 tablet (75 mg total) by mouth daily.  colchicine 0.6 MG tablet Take 0.6 mg by mouth daily.      Continuous Glucose Receiver (FREESTYLE LIBRE 3 READER) DEVI       Continuous Glucose Sensor (FREESTYLE LIBRE 3 SENSOR) MISC       Cyanocobalamin  (VITAMIN B-12 IJ) Inject 1,000 mg as directed See admin instructions. Every 30 days as needed      diclofenac  Sodium (VOLTAREN ) 1 % GEL Apply 2 g topically daily as needed (pain).      DULoxetine  (CYMBALTA ) 60 MG capsule Take 60 mg by mouth 2 (two) times daily.       fluticasone  (FLONASE ) 50 MCG/ACT nasal spray Place 1 spray into both nostrils daily. 16 g 2    furosemide  (LASIX ) 20 MG tablet Take 20 mg by mouth every Tuesday, Thursday, and Saturday at 6 PM.       gabapentin  (NEURONTIN ) 300 MG capsule Take 600 mg by mouth 3 (three) times daily.      glucose blood test strip 1 each by Misc.(Non-Drug; Combo Route) route daily.      HUMULIN R  100 UNIT/ML injection Use as sliding scale up to 10 units tid with meals      Insulin  Glargine (TOUJEO  SOLOSTAR Benedict) Inject 70 Units into the skin daily with supper.      insulin  NPH-regular Human (NOVOLIN 70/30) (70-30) 100 UNIT/ML injection Please  inject 25 units twice daily before breakfast and dinner. 10 mL 3    Insulin  Pen Needle (FIFTY50 PEN NEEDLES) 32G X 4 MM MISC 1 each by Misc.(Non-Drug; Combo Route) route daily.      Ipratropium-Albuterol  (COMBIVENT) 20-100 MCG/ACT AERS respimat (20-100 mcg/actuat)      Lancets (ONETOUCH ULTRASOFT) lancets USE 1 LANCET TO CHECK GLUCOSE IN THE MORNING BEFORE BREAKFAST  11    losartan  (COZAAR ) 25 MG tablet Take 25 mg by mouth at bedtime.      meclizine  (ANTIVERT ) 12.5 MG tablet Take 1 tablet (12.5 mg total) by mouth 3 (three) times daily as needed for dizziness. 20 tablet 0    methocarbamol (ROBAXIN) 500 MG tablet Take 500 mg by mouth 3 (three) times daily as needed for muscle spasms.      metoprolol  succinate (TOPROL -XL) 100 MG 24 hr tablet Take 100 mg by mouth daily.      nystatin cream (MYCOSTATIN) Apply topically 2 (two) times daily as needed.      OZEMPIC, 0.25 OR 0.5 MG/DOSE, 2 MG/3ML SOPN Inject 0.25 mg as directed once a week.      pantoprazole  (PROTONIX ) 20 MG tablet TAKE 1 TABLET BY MOUTH TWICE DAILY BEFORE A MEAL 60 tablet 0    potassium chloride  SA (K-DUR,KLOR-CON ) 20 MEQ tablet Take 1 tablet (20 mEq total) by mouth 2 (two) times daily. 30 tablet 3    primidone  (MYSOLINE ) 50 MG tablet Take 100 mg by mouth 2 (two) times daily.      sildenafil (VIAGRA) 100 MG tablet Take 100 mg by mouth at bedtime as needed.      spironolactone (ALDACTONE) 25 MG tablet Take 25 mg by mouth every Monday, Wednesday, and Friday.      tamsulosin  (FLOMAX ) 0.4 MG CAPS capsule Take 1 capsule (0.4 mg total) by mouth daily after breakfast. 30 capsule 12    traMADol  (ULTRAM ) 50 MG tablet Take 50 mg by mouth daily as needed for moderate pain.       varenicline  (CHANTIX ) 1 MG tablet Take 1 tablet (1 mg total) by mouth 2 (two) times daily. 60 tablet 0  Assessment: Patient presented with severe sepsis. Antibiotics initiated and now patient has new onset afib. Not on any oral anticoagulants. Pharmacy asked to start  heparin .  2/6 AM update:  Heparin  level sub-therapeutic   Goal of Therapy:  Heparin  level 0.3-0.7 units/ml Monitor platelets by anticoagulation protocol: Yes   Plan:  Heparin  2000 units bolus Inc heparin  to 1350 units/hr Heparin  level in 8 hours  Lynwood Mckusick, PharmD, BCPS Clinical Pharmacist Phone: [phone]       [1] No Known Allergies

## 2024-08-18 NOTE — Progress Notes (Signed)
 Pt agreeable to having HH arranged. HHPT has been arranged with [facility]. HH order will need to be placed prior to discharge.    08/18/24 1434  TOC Brief Assessment  Insurance and Status Reviewed  Patient has primary care physician Yes  Home environment has been reviewed From home w/ spouse  Prior level of function: Modified independent  Prior/Current Home Services No current home services  Social Drivers of Health Review SDOH reviewed no interventions necessary  Readmission risk has been reviewed Yes  Transition of care needs transition of care needs identified, TOC will continue to follow

## 2024-08-18 NOTE — Inpatient Diabetes Management (Signed)
 Inpatient Diabetes Program Recommendations  AACE/ADA: New Consensus Statement on Inpatient Glycemic Control (2015)  Target Ranges:  Prepandial:   less than 140 mg/dL      Peak postprandial:   less than 180 mg/dL (1-2 hours)      Critically ill patients:  140 - 180 mg/dL   Lab Results  Component Value Date   GLUCAP 247 (H) 08/18/2024   HGBA1C 7.8 (H) 08/17/2024    Review of Glycemic Control  Latest Reference Range & Units 08/17/24 17:43 08/17/24 21:33 08/18/24 07:27  Glucose-Capillary 70 - 99 mg/dL 880 (H) 821 (H) 752 (H)  (H): Data is abnormally high Diabetes history: Type 2 DM Outpatient Diabetes medications: Ozempic 0.25 mg qwk, Toujeo  70 units at bedtime, Regular 0-10 units TID Current orders for Inpatient glycemic control: Novolog  0-9 units TID & HS  Inpatient Diabetes Program Recommendations:    Consider adding Lantus  15 units every day.   Thanks, Tinnie Minus, MSN, RNC-OB Diabetes Coordinator [phone] (8a-5p)

## 2024-08-18 NOTE — Progress Notes (Addendum)
 " PROGRESS NOTE    James Zuniga  FMW:[id number] DOB: 1955/09/13 DOA: 08/17/2024 PCP: Joshua Santana LITTIE, NP    Brief Narrative:  James Zuniga is a 69 y.o. male with medical history significant of multiple myeloma, obstructive sleep apnea, emphysema, chronic systolic heart failure (on diuretics 3 times per week) who presents to the ER after a 2 to 3-day period of weakness, vomiting, fatigue, dark and foul-smelling urine. Wife states that patient's been weaker over the last 2 days and she has had to help carry him from place to place.  Denies any falls. Patient states that since he has been given fluid as well as antibiotics in the ER he is starting to feel better.   In the ER, his creatinine was found to be 3.71/4 from a baseline of around 1.2.  His lactic acid was elevated at 2.8.  His white blood cell count was elevated at 15.7.  His urine showed large leukocytes, many bacteria and greater than 50 WBCs with clumps.  Cultures were obtained-blood and urine.  He was given IV antibiotics.  CT scan was done that showed a 1.4 cm calculus in the lower pole of the left kidney.  There was also air in the bladder lumen and lower pole collecting system of the left kidney.  ER physician spoke with Dr. Devere (urology) no need for current urologic involvement-they will see as an outpatient   Assessment and Plan: Septic shock -Has received 3 L of IV fluids-will need to be monitored closely for volume overload - Blood cultures pending, urine culture pending -Flu and COVID-negative - May need further imaging of spine (has chronic pain and does not say his pain is worse than prior) -Required Levophed  on evening of 2/5 into 2/6 but has since been weaned off -IV antibiotics (given rocephin  in ER, will give dose of vanc x1) - Suspected urinary source - Patient does have a renal stone but ER spoke with urology who states no intervention currently needed (per ER MD: I spoke with urology.  Dr. Carolynn and he stated the  patient does not need any urology intervention at this time.  He stated that after the patient recovers from his urosepsis that he can be seen as an outpatient and they can treat his large kidney stone at that point )   Atrial fib/flutter - Heparin  drip - Amiodarone  until blood pressure improved and can be restarted on beta-blocker - Has cardiology follow-up outpatient but may need cardiology consult inpatient - Echo  AKI - Improving with IV fluids   Type 2 diabetes - Sliding scale insulin    Hypomagnesemia - Replete  Hypertension - Holding home medications as patient is hypotensive (on amlodipine/metoprolol /losartan )   Chronic systolic heart failure-EF 45 to 50% - Follows with Dr. Epifanio - On diuretics 3 times per week-have been held in light of hypotension and septic picture   OSA -CPAP at bedtime   Emphysema - Follows with Dr. Alva - Breo inhaler - Smoking cessation   Smoldering multiple myeloma - Skeletal survey in February 2025 with no lytic lesions - Follows with Delon Hope, NP at oncology center   Normocytic anemia - Gets IV iron  infusions per oncology   History of stroke - Patient has a loop recorder    DVT prophylaxis: SCDs Start: 08/17/24 1332    Code Status: Full Code Family Communication: Wife at bedside  Disposition Plan:  Level of care: ICU Status is: Inpatient   Consultants:  None  Subjective: Only drinks  coffee for breakfast - Overall feeling better but legs are weak  Objective: Vitals:   08/18/24 1230 08/18/24 1300 08/18/24 1400 08/18/24 1500  BP: 115/73 105/72 (!) 125/99 123/88  Pulse: 89     Resp: (!) 22 (!) 27    Temp: 98.4 F (36.9 C) 98.8 F (37.1 C) 99.5 F (37.5 C) (!) 100.8 F (38.2 C)  TempSrc:      SpO2: 99%     Weight:      Height:        Intake/Output Summary (Last 24 hours) at 08/18/2024 1526 Last data filed at 08/18/2024 1221 Gross per 24 hour  Intake 2433.9 ml  Output 1800 ml  Net 633.9 ml   Filed  Weights   08/17/24 0810 08/17/24 1519  Weight: 105 kg 103.3 kg    Examination:   General: Appearance:    Obese male in no acute distress     Lungs:     respirations unlabored  Heart:    Normal heart rate.   MS:   All extremities are intact.    Neurologic:   Awake, alert       Data Reviewed: I have personally reviewed following labs and imaging studies  CBC: Recent Labs  Lab 08/17/24 0830 08/17/24 0844 08/18/24 0753  WBC 15.7*  --  15.0*  NEUTROABS 13.6*  --   --   HGB 10.2* 11.9* 8.1*  HCT 34.6* 35.0* 27.4*  MCV 81.0  --  80.8  PLT 329  --  283   Basic Metabolic Panel: Recent Labs  Lab 08/17/24 0830 08/17/24 0844 08/17/24 1723 08/18/24 0753  NA 133* 134* 138 140  K 4.5 4.5 3.7 3.6  CL 95* 101 105 103  CO2 20*  --  20* 22  GLUCOSE 166* 168* 108* 265*  BUN 33* 41* 29* 22  CREATININE 3.71* 4.00* 2.92* 2.22*  CALCIUM  10.0  --  9.0 8.9  MG  --   --  1.6*  --    GFR: Estimated Creatinine Clearance: 35.4 mL/min (A) (by C-G formula based on SCr of 2.22 mg/dL (H)). Liver Function Tests: Recent Labs  Lab 08/17/24 0830 08/18/24 0753  AST 21 16  ALT 9 9  ALKPHOS 111 116  BILITOT 0.9 0.4  PROT 8.4* 6.6  ALBUMIN 3.6 2.9*   Recent Labs  Lab 08/17/24 0830  LIPASE 21   No results for input(s): AMMONIA in the last 168 hours. Coagulation Profile: Recent Labs  Lab 08/17/24 0843  INR 1.1   Cardiac Enzymes: No results for input(s): CKTOTAL, CKMB, CKMBINDEX, TROPONINI in the last 168 hours. BNP (last 3 results) No results for input(s): PROBNP in the last 8760 hours. HbA1C: Recent Labs    08/17/24 2340  HGBA1C 7.8*   CBG: Recent Labs  Lab 08/17/24 1031 08/17/24 1743 08/17/24 2133 08/18/24 0727 08/18/24 1128  GLUCAP 136* 119* 178* 247* 243*   Lipid Profile: No results for input(s): CHOL, HDL, LDLCALC, TRIG, CHOLHDL, LDLDIRECT in the last 72 hours. Thyroid  Function Tests: Recent Labs    08/18/24 0856  TSH 0.372    Anemia Panel: No results for input(s): VITAMINB12, FOLATE, FERRITIN, TIBC, IRON , RETICCTPCT in the last 72 hours. Sepsis Labs: Recent Labs  Lab 08/17/24 0843 08/17/24 1356  LATICACIDVEN 2.8* 1.9    Recent Results (from the past 240 hours)  Resp panel by RT-PCR (RSV, Flu A&B, Covid) Anterior Nasal Swab     Status: None   Collection Time: 08/17/24  8:29 AM   Specimen:  Anterior Nasal Swab  Result Value Ref Range Status   SARS Coronavirus 2 by RT PCR NEGATIVE NEGATIVE Final    Comment: (NOTE) SARS-CoV-2 target nucleic acids are NOT DETECTED.  The SARS-CoV-2 RNA is generally detectable in upper respiratory specimens during the acute phase of infection. The lowest concentration of SARS-CoV-2 viral copies this assay can detect is 138 copies/mL. A negative result does not preclude SARS-Cov-2 infection and should not be used as the sole basis for treatment or other patient management decisions. A negative result may occur with  improper specimen collection/handling, submission of specimen other than nasopharyngeal swab, presence of viral mutation(s) within the areas targeted by this assay, and inadequate number of viral copies(<138 copies/mL). A negative result must be combined with clinical observations, patient history, and epidemiological information. The expected result is Negative.  Fact Sheet for Patients:  bloggercourse.com  Fact Sheet for Healthcare Providers:  seriousbroker.it  This test is no t yet approved or cleared by the [place]  FDA and  has been authorized for detection and/or diagnosis of SARS-CoV-2 by FDA under an Emergency Use Authorization (EUA). This EUA will remain  in effect (meaning this test can be used) for the duration of the COVID-19 declaration under Section 564(b)(1) of the Act, 21 U.S.C.section 360bbb-3(b)(1), unless the authorization is terminated  or revoked sooner.        Influenza A by PCR NEGATIVE NEGATIVE Final   Influenza B by PCR NEGATIVE NEGATIVE Final    Comment: (NOTE) The Xpert Xpress SARS-CoV-2/FLU/RSV plus assay is intended as an aid in the diagnosis of influenza from Nasopharyngeal swab specimens and should not be used as a sole basis for treatment. Nasal washings and aspirates are unacceptable for Xpert Xpress SARS-CoV-2/FLU/RSV testing.  Fact Sheet for Patients: bloggercourse.com  Fact Sheet for Healthcare Providers: seriousbroker.it  This test is not yet approved or cleared by the [place]  FDA and has been authorized for detection and/or diagnosis of SARS-CoV-2 by FDA under an Emergency Use Authorization (EUA). This EUA will remain in effect (meaning this test can be used) for the duration of the COVID-19 declaration under Section 564(b)(1) of the Act, 21 U.S.C. section 360bbb-3(b)(1), unless the authorization is terminated or revoked.     Resp Syncytial Virus by PCR NEGATIVE NEGATIVE Final    Comment: (NOTE) Fact Sheet for Patients: bloggercourse.com  Fact Sheet for Healthcare Providers: seriousbroker.it  This test is not yet approved or cleared by the [place]  FDA and has been authorized for detection and/or diagnosis of SARS-CoV-2 by FDA under an Emergency Use Authorization (EUA). This EUA will remain in effect (meaning this test can be used) for the duration of the COVID-19 declaration under Section 564(b)(1) of the Act, 21 U.S.C. section 360bbb-3(b)(1), unless the authorization is terminated or revoked.  Performed at [facility], [address] [place], [place] [address]   Blood Culture (routine x 2)     Status: None (Preliminary result)   Collection Time: 08/17/24  8:43 AM   Specimen: BLOOD  Result Value Ref Range Status   Specimen Description BLOOD BLOOD RIGHT ARM  Final   Special Requests   Final    BOTTLES  DRAWN AEROBIC AND ANAEROBIC Blood Culture results may not be optimal due to an inadequate volume of blood received in culture bottles   Culture   Final    NO GROWTH < 24 HOURS Performed at [facility], [address] [place], [place] [address]    Report Status PENDING  Incomplete  Blood  Culture (routine x 2)     Status: None (Preliminary result)   Collection Time: 08/17/24  8:44 AM   Specimen: BLOOD  Result Value Ref Range Status   Specimen Description BLOOD BLOOD LEFT ARM  Final   Special Requests   Final    BOTTLES DRAWN AEROBIC AND ANAEROBIC Blood Culture adequate volume   Culture   Final    NO GROWTH < 24 HOURS Performed at [facility], [address], [place], [place] [address]    Report Status PENDING  Incomplete  Urine Culture     Status: None   Collection Time: 08/17/24 10:52 AM   Specimen: Urine, Random  Result Value Ref Range Status   Specimen Description   Final    URINE, RANDOM Performed at [facility], [address], [place], [place] [address]    Special Requests   Final    NONE Reflexed from [id number] Performed at [facility], [address] [place], [place] [address]    Culture   Final    NO GROWTH Performed at [facility], [address], [place], [place] [address]    Report Status 08/18/2024 FINAL  Final  MRSA Next Gen by PCR, Nasal     Status: None   Collection Time: 08/17/24  5:23 PM   Specimen: Nasal Mucosa; Nasal Swab  Result Value Ref Range Status   MRSA by PCR Next Gen NOT DETECTED NOT DETECTED Final    Comment: (NOTE) The GeneXpert MRSA Assay (FDA approved for NASAL specimens only), is one component of a comprehensive MRSA colonization surveillance program. It is not intended to diagnose MRSA infection nor to guide or monitor treatment for MRSA infections. Test performance is not FDA approved in patients less than 71 years old. Performed at [facility], [address] [place], [place] [address]          Radiology  Studies: ECHOCARDIOGRAM COMPLETE Result Date: 08/18/2024    ECHOCARDIOGRAM REPORT   Patient Name:   JAKSEN FIORELLA Testa Date of Exam: 08/18/2024 Medical Rec #:  [id number]    Height:       66.0 in Accession #:    [id number]   Weight:       227.7 lb Date of Birth:  08-08-1955   BSA:          2.113 m Patient Age:    68 years     BP:           102/61 mmHg Patient Gender: M            HR:           84 bpm. Exam Location:  [facility] Procedure: Cardiac Doppler, Color Doppler and Strain Analysis (Both Spectral and            Color Flow Doppler were utilized during procedure). Indications:    Atrial Fibrillation I48.91  History:        Patient has prior history of Echocardiogram examinations, most                 recent 12/25/2016. Risk Factors:Hypertension, Diabetes and                 Dyslipidemia.  Sonographer:    Aida Pizza RCS Referring Phys: [phone] Tyris Eliot RAYMOND BOWL  Sonographer Comments: Global longitudinal strain was attempted. IMPRESSIONS  1. Left ventricular ejection fraction, by estimation, is 40 to 45%. The left ventricle has mildly decreased function. Left ventricular endocardial border not optimally defined to  evaluate regional wall motion. Left ventricular diastolic parameters are indeterminate. The average left ventricular global longitudinal strain is -7.4 %.  2. Right ventricular systolic function is normal. The right ventricular size is normal. Tricuspid regurgitation signal is inadequate for assessing PA pressure.  3. The mitral valve is normal in structure. No evidence of mitral valve regurgitation. No evidence of mitral stenosis.  4. The aortic valve is tricuspid. Aortic valve regurgitation is not visualized. No aortic stenosis is present.  5. The inferior vena cava is normal in size with greater than 50% respiratory variability, suggesting right atrial pressure of 3 mmHg. Comparison(s): No prior Echocardiogram. FINDINGS  Left Ventricle: Left ventricular ejection fraction, by estimation, is 40 to 45%. The left  ventricle has mildly decreased function. Left ventricular endocardial border not optimally defined to evaluate regional wall motion. The average left ventricular global longitudinal strain is -7.4 %. Strain was performed and the global longitudinal strain is indeterminate. The left ventricular internal cavity size was normal in size. There is no left ventricular hypertrophy. Left ventricular diastolic parameters are indeterminate. Right Ventricle: The right ventricular size is normal. No increase in right ventricular wall thickness. Right ventricular systolic function is normal. Tricuspid regurgitation signal is inadequate for assessing PA pressure. Left Atrium: Left atrial size was normal in size. Right Atrium: Right atrial size was normal in size. Pericardium: There is no evidence of pericardial effusion. Mitral Valve: The mitral valve is normal in structure. No evidence of mitral valve regurgitation. No evidence of mitral valve stenosis. Tricuspid Valve: The tricuspid valve is normal in structure. Tricuspid valve regurgitation is trivial. No evidence of tricuspid stenosis. Aortic Valve: The aortic valve is tricuspid. Aortic valve regurgitation is not visualized. No aortic stenosis is present. Pulmonic Valve: The pulmonic valve was normal in structure. Pulmonic valve regurgitation is not visualized. No evidence of pulmonic stenosis. Aorta: The aortic root and ascending aorta are structurally normal, with no evidence of dilitation. Venous: The inferior vena cava is normal in size with greater than 50% respiratory variability, suggesting right atrial pressure of 3 mmHg. IAS/Shunts: No atrial level shunt detected by color flow Doppler. Additional Comments: 3D was performed not requiring image post processing on an independent workstation and was indeterminate.  LEFT VENTRICLE PLAX 2D LVIDd:         5.50 cm      Diastology LVIDs:         4.40 cm      LV e' medial:    10.20 cm/s LV PW:         1.20 cm      LV E/e'  medial:  7.6 LV IVS:        1.10 cm      LV e' lateral:   10.40 cm/s LVOT diam:     2.40 cm      LV E/e' lateral: 7.5 LV SV:         57 LV SV Index:   27           2D Longitudinal Strain LVOT Area:     4.52 cm     2D Strain GLS Avg:     -7.4 %  LV Volumes (MOD) LV vol d, MOD A2C: 96.2 ml LV vol d, MOD A4C: 115.0 ml LV vol s, MOD A2C: 54.6 ml LV vol s, MOD A4C: 68.6 ml LV SV MOD A2C:     41.6 ml LV SV MOD A4C:     115.0 ml LV SV MOD BP:  43.1 ml RIGHT VENTRICLE RV S prime:     13.80 cm/s TAPSE (M-mode): 1.8 cm LEFT ATRIUM             Index        RIGHT ATRIUM           Index LA diam:        2.90 cm 1.37 cm/m   RA Area:     17.10 cm LA Vol (A2C):   68.7 ml 32.51 ml/m  RA Volume:   48.70 ml  23.04 ml/m LA Vol (A4C):   58.6 ml 27.71 ml/m LA Biplane Vol: 67.5 ml 31.94 ml/m  AORTIC VALVE LVOT Vmax:   68.90 cm/s LVOT Vmean:  50.000 cm/s LVOT VTI:    0.126 m  AORTA Ao Root diam: 3.80 cm MITRAL VALVE MV Area (PHT): 2.95 cm    SHUNTS MV Decel Time: 257 msec    Systemic VTI:  0.13 m MV E velocity: 77.90 cm/s  Systemic Diam: 2.40 cm MV A velocity: 58.50 cm/s MV E/A ratio:  1.33 Vishnu Priya Mallipeddi Electronically signed by Diannah Late Mallipeddi Signature Date/Time: 08/18/2024/2:53:46 PM    Final    DG CHEST PORT 1 VIEW Result Date: 08/17/2024 CLINICAL DATA:  Status post PICC placement. EXAM: PORTABLE CHEST 1 VIEW COMPARISON:  Radiograph earlier today FINDINGS: Tip of the right upper extremity PICC in the lower SVC. No pneumothorax. Lower lung volumes from prior exam. Cardiomegaly is stable. Left chest wall loop recorder. Vascular congestion versus bronchovascular crowding. No significant pleural effusion. Left chest wall loop recorder. IMPRESSION: 1. Tip of the right upper extremity PICC in the lower SVC. No pneumothorax. 2. Lower lung volumes from prior exam. Electronically Signed   By: Andrea Gasman M.D.   On: 08/17/2024 16:57   US  EKG SITE RITE Result Date: 08/17/2024 If Site Rite image not attached,  placement could not be confirmed due to current cardiac rhythm.  CT ABDOMEN PELVIS WO CONTRAST Result Date: 08/17/2024 CLINICAL DATA:  Abdominal pain, acute, nonlocalized Possible sepsis. EXAM: CT ABDOMEN AND PELVIS WITHOUT CONTRAST TECHNIQUE: Multidetector CT imaging of the abdomen and pelvis was performed following the standard protocol without IV contrast. RADIATION DOSE REDUCTION: This exam was performed according to the departmental dose-optimization program which includes automated exposure control, adjustment of the mA and/or kV according to patient size and/or use of iterative reconstruction technique. COMPARISON:  Abdominal CT 12/16/2022. Abdominal MRI 05/31/2024. PET-CT 10/08/2022. FINDINGS: Lower chest: Mild dependent atelectasis or scarring at both lung bases. No suspicious nodularity or significant pleural effusion. Hepatobiliary: The liver has a non cirrhotic morphology without suspicious focal abnormality on noncontrast imaging. Previously demonstrated hepatic cysts are grossly stable. No evidence of gallstones, gallbladder wall thickening or biliary dilatation. Pancreas: Unremarkable. No pancreatic ductal dilatation or surrounding inflammatory changes. Spleen: Normal in size without focal abnormality. 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 The left adrenal gland appears unremarkable. Previously demonstrated 1.4 cm calculus in the lower pole of the left kidney has moved cephalad into the lower pole infundibulum and is associated with mild lower pole caliectasis and perinephric soft tissue stranding, suspicious for partial obstruction or inflammation/infection. Small dot of air anteriorly in the lower pole of the left kidney, likely in the collecting system. No evidence of perinephric fluid collection, ureteral calculus or ureteral dilatation. The right kidney appears unremarkable. There is air in  the bladder lumen which may be iatrogenic. Stomach/Bowel:  No enteric contrast administered. The stomach appears unremarkable for its degree of distension. No evidence of bowel wall thickening, distention or surrounding inflammatory change. The appendix appears normal. Mildly prominent stool throughout the colon. Vascular/Lymphatic: There are no enlarged abdominal or pelvic lymph nodes. Aortic and branch vessel atherosclerosis without evidence of aneurysm. Reproductive: The prostate gland and seminal vesicles appear unremarkable. Other: No evidence of abdominal wall mass or hernia. No ascites or pneumoperitoneum. Musculoskeletal: No acute or significant osseous findings. Multilevel lumbar facet arthropathy. IMPRESSION: 1. Previously demonstrated 1.4 cm calculus in the lower pole of the left kidney has moved cephalad into the lower pole infundibulum and is associated with mild lower pole caliectasis and perinephric soft tissue stranding, suspicious for partial obstruction or inflammation/infection. Correlate with urine analysis. No evidence of ureteral calculus, ureteral dilatation or perinephric fluid collection. 2. Air in the bladder lumen and lower pole collecting system of the left kidney may be iatrogenic. Correlate with recent instrumentation/urine analysis. 3. No other acute findings. 4. 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 5.  Aortic Atherosclerosis (ICD10-I70.0). Electronically Signed   By: Elsie Perone M.D.   On: 08/17/2024 10:31   DG Chest Port 1 View Result Date: 08/17/2024 EXAM: 1 VIEW XRAY OF THE CHEST 08/17/2024 08:58:00 AM COMPARISON: Chest radiographs 07/23/2021 and earlier. CLINICAL HISTORY: 69 year old male. Questionable sepsis; evaluate for abnormality. FINDINGS: LINES, TUBES AND DEVICES: Loop recorder in left chest, stable. LUNGS AND PLEURA: No focal pulmonary opacity. No pleural effusion. No pneumothorax. HEART AND MEDIASTINUM: No acute  abnormality of the cardiac and mediastinal silhouettes. BONES AND SOFT TISSUES: No acute osseous abnormality. VISIBLE ABDOMEN: Paucity of bowel gas in the visible abdomen. IMPRESSION: 1. No acute cardiopulmonary abnormality. Electronically signed by: Helayne Hurst MD 08/17/2024 09:07 AM EST RP Workstation: [id number]        Scheduled Meds:  Chlorhexidine  Gluconate Cloth  6 each Topical Q0600   fluconazole   100 mg Oral Daily   fluticasone  furoate-vilanterol  1 puff Inhalation Daily   insulin  aspart  0-5 Units Subcutaneous QHS   insulin  aspart  0-9 Units Subcutaneous TID WC   melatonin  3 mg Oral QHS   sodium chloride  flush  10-40 mL Intracatheter Q12H   Continuous Infusions:  amiodarone  30 mg/hr (08/18/24 1418)   cefTRIAXone  (ROCEPHIN )  IV 200 mL/hr at 08/18/24 1221   heparin  1,400 Units/hr (08/18/24 1221)   lactated ringers        LOS: 1 day    CRITICAL CARE Performed by: Harlene RAYMOND Bowl   Total critical care time: 65 minutes  Critical care time was exclusive of separately billable procedures and treating other patients.  Critical care was necessary to treat or prevent imminent or life-threatening deterioration.  Critical care was time spent personally by me on the following activities: development of treatment plan with patient and/or surrogate as well as nursing, discussions with consultants, evaluation of patient's response to treatment, examination of patient, obtaining history from patient or surrogate, ordering and performing treatments and interventions, ordering and review of laboratory studies, ordering and review of radiographic studies, pulse oximetry and re-evaluation of patient's condition. car    Harlene RAYMOND Bowl, DO [facility] Available via Epic secure chat 7am-7pm After these hours, please refer to coverage provider listed on [email or web address] 08/18/2024, 3:26 PM   "

## 2024-08-18 NOTE — Telephone Encounter (Signed)
 Pharmacy Patient Advocate Encounter  Insurance verification completed.    The patient is insured through [facility]. Patient has Medicare and is not eligible for a copay card, but may be able to apply for patient assistance or Medicare RX Payment Plan (Patient Must reach out to their plan, if eligible for payment plan), if available.    Ran test claim for Eliquis 5mg  tablet and the current 30 day co-pay is $47.40.   This test claim was processed through [facility]- copay amounts may vary at other pharmacies due to pharmacy/plan contracts, or as the patient moves through the different stages of their insurance plan.

## 2024-08-18 NOTE — Evaluation (Signed)
 Physical Therapy Evaluation Patient Details Name: James Zuniga MRN: [id number] DOB: 1956-01-14 Today's Date: 08/18/2024  History of Present Illness  James Zuniga is a 69 y.o. male with medical history significant of multiple myeloma, obstructive sleep apnea, emphysema, chronic systolic heart failure (on diuretics 3 times per week) who presents to the ER after a 2 to 3-day period of weakness, vomiting, fatigue, dark and foul-smelling urine.  Wife states that patient's been weaker over the last 2 days and she has had to help carry him from place to place.  Denies any falls.  Patient states that since he has been given fluid as well as antibiotics in the ER he is starting to feel better.   Clinical Impression  Patient was agreeable to PT evaluation. Patient's wife is present during session and contributes to history taking. They report at baseline, patient ambulates with a SPC and is independent with ADLs. This date, patient requires min/CGA assist during bed mobility and CGA during transfers and ambulation using SPC. Patient demonstrates labored movement indicating general weakness, some unsteadiness, and endurance deficits. Patient was on RA during session with SpO2 remaining at normal levels. Patient becomes tachycardic during mobility but quickly recovers once seated in chair. Patient tolerates sitting in chair at EOS, all needs met, and wife present. Wife reports feeling comfortable and physically able to assist patient at his current level of function once discharged. Patient will benefit from continued skilled physical therapy acutely and in recommended venue in order to address current deficits and improve overall function.        If plan is discharge home, recommend the following: A little help with walking and/or transfers;A little help with bathing/dressing/bathroom;Assistance with cooking/housework;Assist for transportation;Help with stairs or ramp for entrance   Can travel by private  vehicle        Equipment Recommendations None recommended by PT  Recommendations for Other Services       Functional Status Assessment Patient has had a recent decline in their functional status and demonstrates the ability to make significant improvements in function in a reasonable and predictable amount of time.     Precautions / Restrictions Precautions Precautions: Fall Recall of Precautions/Restrictions: Intact Restrictions Weight Bearing Restrictions Per Provider Order: No      Mobility  Bed Mobility Overal bed mobility: Needs Assistance Bed Mobility: Supine to Sit     Supine to sit: Min assist, Contact guard     General bed mobility comments: HOB flat, mild assist with getting LE to EOB, pt able to perform remainder of mobility independently but with increased time, CGA at trunk for safety once sitting EOB, slow labored movement throughout    Transfers Overall transfer level: Needs assistance Equipment used: Straight cane Transfers: Sit to/from Stand, Bed to chair/wheelchair/BSC Sit to Stand: Contact guard assist   Step pivot transfers: Contact guard assist       General transfer comment: pt able to stand from bed with support of SPC and CGA for safety, pt rocks forward/backward to gain momentum to stand, once standing pt is mildly unsteady, CGA during steps to chair    Ambulation/Gait Ambulation/Gait assistance: Contact guard assist Gait Distance (Feet): 5 Feet Assistive device: Straight cane Gait Pattern/deviations: Step-through pattern, Decreased step length - right, Decreased step length - left, Decreased stride length Gait velocity: Dec     General Gait Details: pt limited to a few side/forward/backward steps at bedside with [facility] and CGA for safety, pt demo labored movement throughout as well as  mild unsteadiness but no overt LOB  Stairs            Wheelchair Mobility     Tilt Bed    Modified Rankin (Stroke Patients Only)        Balance Overall balance assessment: Needs assistance Sitting-balance support: Feet supported, Bilateral upper extremity supported Sitting balance-Leahy Scale: Good Sitting balance - Comments: Seated EOB   Standing balance support: During functional activity, Reliant on assistive device for balance, Single extremity supported Standing balance-Leahy Scale: Fair Standing balance comment: w/ RW         Pertinent Vitals/Pain Pain Assessment Pain Assessment: No/denies pain    Home Living Family/patient expects to be discharged to:: Private residence Living Arrangements: Spouse/significant other Available Help at Discharge: Family;Available 24 hours/day Type of Home: House Home Access: Stairs to enter Entrance Stairs-Rails: None Entrance Stairs-Number of Steps: 2   Home Layout: One level Home Equipment: Cane - single point      Prior Function Prior Level of Function : Independent/Modified Independent       Mobility Comments: pt and wife report pt comunity ambulator with [facility] ADLs Comments: Independent with ADLs, wife does iADLs     Extremity/Trunk Assessment   Upper Extremity Assessment Upper Extremity Assessment: Generalized weakness (Bilateral limited shoulder flexion AROM but L>R. MMT 4-/5)    Lower Extremity Assessment Lower Extremity Assessment: Generalized weakness;Overall [facility] for tasks assessed (ankle DF MMT 4/5 bilaterally, hip flexion 4-/5 bilaterally)    Cervical / Trunk Assessment Cervical / Trunk Assessment: Kyphotic  Communication   Communication Communication: No apparent difficulties    Cognition Arousal: Alert Behavior During Therapy: WFL for tasks assessed/performed     Following commands: Intact       Cueing Cueing Techniques: Verbal cues, Visual cues     General Comments      Exercises     Assessment/Plan    PT Assessment Patient needs continued PT services;All further PT needs can be met in the next venue of care  PT Problem  List Decreased strength;Decreased range of motion;Decreased activity tolerance;Decreased balance;Decreased mobility       PT Treatment Interventions DME instruction;Balance training;Gait training;Functional mobility training;Therapeutic activities;Therapeutic exercise;Patient/family education    PT Goals (Current goals can be found in the Care Plan section)  Acute Rehab PT Goals Patient Stated Goal: Return home PT Goal Formulation: With patient/family Time For Goal Achievement: 08/25/24 Potential to Achieve Goals: Good    Frequency Min 3X/week     Co-evaluation               AM-PAC PT 6 Clicks Mobility  Outcome Measure Help needed turning from your back to your side while in a flat bed without using bedrails?: A Little Help needed moving from lying on your back to sitting on the side of a flat bed without using bedrails?: A Little Help needed moving to and from a bed to a chair (including a wheelchair)?: A Little Help needed standing up from a chair using your arms (e.g., wheelchair or bedside chair)?: A Little Help needed to walk in hospital room?: A Little Help needed climbing 3-5 steps with a railing? : A Lot 6 Click Score: 17    End of Session Equipment Utilized During Treatment: Gait belt Activity Tolerance: Patient tolerated treatment well Patient left: in chair;with call bell/phone within reach;with family/visitor present Nurse Communication: Mobility status PT Visit Diagnosis: Unsteadiness on feet (R26.81);Other abnormalities of gait and mobility (R26.89);Muscle weakness (generalized) (M62.81);Difficulty in walking, not elsewhere classified (R26.2)  Time: 8950-8887 PT Time Calculation (min) (ACUTE ONLY): 23 min   Charges:   PT Evaluation $PT Eval Moderate Complexity: 1 Mod   PT General Charges $$ ACUTE PT VISIT: 1 Visit         2:32 PM, 08/18/24 Jacklynn Dehaas Powell-Butler, PT, DPT [place] with [facility]

## 2024-08-18 NOTE — Plan of Care (Signed)
" °  Problem: Acute Rehab PT Goals(only PT should resolve) Goal: Pt Will Go Supine/Side To Sit Outcome: Progressing Flowsheets (Taken 08/18/2024 1434) Pt will go Supine/Side to Sit: with supervision Goal: Patient Will Transfer Sit To/From Stand Outcome: Progressing Flowsheets (Taken 08/18/2024 1434) Patient will transfer sit to/from stand: with supervision Goal: Pt Will Transfer Bed To Chair/Chair To Bed Outcome: Progressing Flowsheets (Taken 08/18/2024 1434) Pt will Transfer Bed to Chair/Chair to Bed: with supervision Goal: Pt Will Ambulate Outcome: Progressing Flowsheets (Taken 08/18/2024 1434) Pt will Ambulate:  25 feet  with supervision  with least restrictive assistive device    2:35 PM, 08/18/24 Rosaria Settler, PT, DPT [place] with [facility]  "

## 2024-08-25 ENCOUNTER — Inpatient Hospital Stay: Attending: Hematology

## 2024-09-06 ENCOUNTER — Ambulatory Visit (HOSPITAL_BASED_OUTPATIENT_CLINIC_OR_DEPARTMENT_OTHER): Admitting: Pulmonary Disease

## 2024-10-03 ENCOUNTER — Inpatient Hospital Stay

## 2024-10-10 ENCOUNTER — Inpatient Hospital Stay: Admitting: Oncology
